# Patient Record
Sex: Male | Born: 1975 | Race: Black or African American | Hispanic: No | Marital: Married | Smoking: Former smoker
Health system: Southern US, Community
[De-identification: ages and names within clinical notes are randomized; demographics above are authoritative.]

## PROBLEM LIST (undated history)

## (undated) DIAGNOSIS — E876 Hypokalemia: Secondary | ICD-10-CM

## (undated) DIAGNOSIS — E875 Hyperkalemia: Secondary | ICD-10-CM

## (undated) DIAGNOSIS — L409 Psoriasis, unspecified: Secondary | ICD-10-CM

## (undated) DIAGNOSIS — K219 Gastro-esophageal reflux disease without esophagitis: Secondary | ICD-10-CM

## (undated) DIAGNOSIS — K529 Noninfective gastroenteritis and colitis, unspecified: Secondary | ICD-10-CM

## (undated) DIAGNOSIS — A419 Sepsis, unspecified organism: Secondary | ICD-10-CM

## (undated) DIAGNOSIS — M25552 Pain in left hip: Secondary | ICD-10-CM

## (undated) DIAGNOSIS — F1910 Other psychoactive substance abuse, uncomplicated: Secondary | ICD-10-CM

## (undated) DIAGNOSIS — L02213 Cutaneous abscess of chest wall: Secondary | ICD-10-CM

## (undated) DIAGNOSIS — F32A Depression, unspecified: Secondary | ICD-10-CM

## (undated) DIAGNOSIS — T7840XA Allergy, unspecified, initial encounter: Secondary | ICD-10-CM

## (undated) DIAGNOSIS — E1010 Type 1 diabetes mellitus with ketoacidosis without coma: Secondary | ICD-10-CM

## (undated) DIAGNOSIS — F17200 Nicotine dependence, unspecified, uncomplicated: Secondary | ICD-10-CM

## (undated) DIAGNOSIS — E1065 Type 1 diabetes mellitus with hyperglycemia: Secondary | ICD-10-CM

## (undated) DIAGNOSIS — M87059 Idiopathic aseptic necrosis of unspecified femur: Secondary | ICD-10-CM

## (undated) DIAGNOSIS — E785 Hyperlipidemia, unspecified: Secondary | ICD-10-CM

## (undated) DIAGNOSIS — E109 Type 1 diabetes mellitus without complications: Secondary | ICD-10-CM

## (undated) DIAGNOSIS — M1990 Unspecified osteoarthritis, unspecified site: Secondary | ICD-10-CM

## (undated) DIAGNOSIS — F1290 Cannabis use, unspecified, uncomplicated: Secondary | ICD-10-CM

## (undated) DIAGNOSIS — R634 Abnormal weight loss: Secondary | ICD-10-CM

## (undated) DIAGNOSIS — N179 Acute kidney failure, unspecified: Secondary | ICD-10-CM

## (undated) DIAGNOSIS — L309 Dermatitis, unspecified: Secondary | ICD-10-CM

## (undated) DIAGNOSIS — F419 Anxiety disorder, unspecified: Secondary | ICD-10-CM

## (undated) DIAGNOSIS — E43 Unspecified severe protein-calorie malnutrition: Secondary | ICD-10-CM

## (undated) DIAGNOSIS — E1110 Type 2 diabetes mellitus with ketoacidosis without coma: Secondary | ICD-10-CM

## (undated) DIAGNOSIS — R9431 Abnormal electrocardiogram [ECG] [EKG]: Secondary | ICD-10-CM

## (undated) DIAGNOSIS — R748 Abnormal levels of other serum enzymes: Secondary | ICD-10-CM

## (undated) DIAGNOSIS — I4510 Unspecified right bundle-branch block: Secondary | ICD-10-CM

## (undated) DIAGNOSIS — M8700 Idiopathic aseptic necrosis of unspecified bone: Secondary | ICD-10-CM

## (undated) DIAGNOSIS — I309 Acute pericarditis, unspecified: Secondary | ICD-10-CM

## (undated) HISTORY — DX: Nicotine dependence, unspecified, uncomplicated: F17.200

## (undated) HISTORY — DX: Hyperkalemia: E87.5

## (undated) HISTORY — DX: Type 1 diabetes mellitus with hyperglycemia: E10.65

## (undated) HISTORY — DX: Cannabis use, unspecified, uncomplicated: F12.90

## (undated) HISTORY — DX: Anxiety disorder, unspecified: F41.9

## (undated) HISTORY — DX: Allergy, unspecified, initial encounter: T78.40XA

## (undated) HISTORY — DX: Unspecified osteoarthritis, unspecified site: M19.90

## (undated) HISTORY — DX: Depression, unspecified: F32.A

## (undated) HISTORY — DX: Pain in left hip: M25.552

## (undated) HISTORY — DX: Acute kidney failure, unspecified: N17.9

## (undated) HISTORY — DX: Abnormal weight loss: R63.4

## (undated) HISTORY — DX: Idiopathic aseptic necrosis of unspecified femur: M87.059

## (undated) HISTORY — DX: Noninfective gastroenteritis and colitis, unspecified: K52.9

## (undated) HISTORY — DX: Hypokalemia: E87.6

## (undated) HISTORY — DX: Idiopathic aseptic necrosis of unspecified bone: M87.00

## (undated) HISTORY — DX: Unspecified severe protein-calorie malnutrition: E43

## (undated) HISTORY — DX: Acute pericarditis, unspecified: I30.9

## (undated) HISTORY — DX: Hyperlipidemia, unspecified: E78.5

## (undated) HISTORY — DX: Other disorders of bilirubin metabolism: E80.6

## (undated) HISTORY — DX: Cutaneous abscess of chest wall: L02.213

## (undated) HISTORY — PX: FINGER SURGERY: SHX640

## (undated) HISTORY — DX: Sepsis, unspecified organism: A41.9

## (undated) HISTORY — DX: Psoriasis, unspecified: L40.9

## (undated) HISTORY — DX: Type 1 diabetes mellitus with ketoacidosis without coma: E10.10

## (undated) HISTORY — DX: Abnormal electrocardiogram (ECG) (EKG): R94.31

## (undated) HISTORY — DX: Other psychoactive substance abuse, uncomplicated: F19.10

## (undated) HISTORY — DX: Unspecified right bundle-branch block: I45.10

## (undated) HISTORY — DX: Abnormal levels of other serum enzymes: R74.8

---

## 1999-02-21 ENCOUNTER — Emergency Department (HOSPITAL_COMMUNITY): Admission: EM | Admit: 1999-02-21 | Discharge: 1999-02-21 | Payer: Self-pay | Admitting: Emergency Medicine

## 1999-04-09 ENCOUNTER — Emergency Department (HOSPITAL_COMMUNITY): Admission: EM | Admit: 1999-04-09 | Discharge: 1999-04-09 | Payer: Self-pay | Admitting: Emergency Medicine

## 2000-01-08 ENCOUNTER — Encounter: Payer: Self-pay | Admitting: Emergency Medicine

## 2000-01-08 ENCOUNTER — Emergency Department (HOSPITAL_COMMUNITY): Admission: EM | Admit: 2000-01-08 | Discharge: 2000-01-08 | Payer: Self-pay | Admitting: Emergency Medicine

## 2000-03-19 ENCOUNTER — Emergency Department (HOSPITAL_COMMUNITY): Admission: EM | Admit: 2000-03-19 | Discharge: 2000-03-19 | Payer: Self-pay | Admitting: Emergency Medicine

## 2000-05-12 ENCOUNTER — Emergency Department (HOSPITAL_COMMUNITY): Admission: EM | Admit: 2000-05-12 | Discharge: 2000-05-12 | Payer: Self-pay | Admitting: Emergency Medicine

## 2002-05-05 ENCOUNTER — Emergency Department (HOSPITAL_COMMUNITY): Admission: EM | Admit: 2002-05-05 | Discharge: 2002-05-05 | Payer: Self-pay | Admitting: Emergency Medicine

## 2002-05-07 ENCOUNTER — Encounter: Payer: Self-pay | Admitting: Emergency Medicine

## 2002-05-07 ENCOUNTER — Emergency Department (HOSPITAL_COMMUNITY): Admission: EM | Admit: 2002-05-07 | Discharge: 2002-05-07 | Payer: Self-pay | Admitting: Emergency Medicine

## 2004-02-03 ENCOUNTER — Emergency Department (HOSPITAL_COMMUNITY): Admission: EM | Admit: 2004-02-03 | Discharge: 2004-02-03 | Payer: Self-pay | Admitting: Family Medicine

## 2006-09-07 ENCOUNTER — Emergency Department (HOSPITAL_COMMUNITY): Admission: EM | Admit: 2006-09-07 | Discharge: 2006-09-07 | Payer: Self-pay | Admitting: Emergency Medicine

## 2011-04-25 ENCOUNTER — Emergency Department (HOSPITAL_COMMUNITY)
Admission: EM | Admit: 2011-04-25 | Discharge: 2011-04-25 | Disposition: A | Discharge status: Home / Self Care | Payer: PRIVATE HEALTH INSURANCE | Attending: Emergency Medicine | Admitting: Emergency Medicine

## 2011-04-25 DIAGNOSIS — K047 Periapical abscess without sinus: Secondary | ICD-10-CM | POA: Insufficient documentation

## 2011-04-25 DIAGNOSIS — R220 Localized swelling, mass and lump, head: Secondary | ICD-10-CM | POA: Insufficient documentation

## 2011-04-25 DIAGNOSIS — K089 Disorder of teeth and supporting structures, unspecified: Secondary | ICD-10-CM | POA: Insufficient documentation

## 2013-03-16 ENCOUNTER — Emergency Department (HOSPITAL_COMMUNITY): Payer: Medicaid Other

## 2013-03-16 ENCOUNTER — Encounter (HOSPITAL_COMMUNITY): Payer: Self-pay | Admitting: Emergency Medicine

## 2013-03-16 ENCOUNTER — Inpatient Hospital Stay (HOSPITAL_COMMUNITY)
Admission: EM | Admit: 2013-03-16 | Discharge: 2013-03-20 | DRG: 637 | Disposition: A | Discharge status: Home / Self Care | Payer: Medicaid Other | Attending: Family Medicine | Admitting: Family Medicine

## 2013-03-16 DIAGNOSIS — F1210 Cannabis abuse, uncomplicated: Secondary | ICD-10-CM | POA: Diagnosis present

## 2013-03-16 DIAGNOSIS — R630 Anorexia: Secondary | ICD-10-CM | POA: Diagnosis present

## 2013-03-16 DIAGNOSIS — E1010 Type 1 diabetes mellitus with ketoacidosis without coma: Principal | ICD-10-CM | POA: Diagnosis present

## 2013-03-16 DIAGNOSIS — E1110 Type 2 diabetes mellitus with ketoacidosis without coma: Secondary | ICD-10-CM

## 2013-03-16 DIAGNOSIS — Z833 Family history of diabetes mellitus: Secondary | ICD-10-CM

## 2013-03-16 DIAGNOSIS — F17200 Nicotine dependence, unspecified, uncomplicated: Secondary | ICD-10-CM | POA: Diagnosis present

## 2013-03-16 DIAGNOSIS — Z681 Body mass index (BMI) 19 or less, adult: Secondary | ICD-10-CM

## 2013-03-16 DIAGNOSIS — E876 Hypokalemia: Secondary | ICD-10-CM

## 2013-03-16 DIAGNOSIS — L259 Unspecified contact dermatitis, unspecified cause: Secondary | ICD-10-CM | POA: Diagnosis present

## 2013-03-16 DIAGNOSIS — E43 Unspecified severe protein-calorie malnutrition: Secondary | ICD-10-CM | POA: Diagnosis present

## 2013-03-16 DIAGNOSIS — R531 Weakness: Secondary | ICD-10-CM

## 2013-03-16 DIAGNOSIS — R634 Abnormal weight loss: Secondary | ICD-10-CM | POA: Diagnosis present

## 2013-03-16 DIAGNOSIS — E1065 Type 1 diabetes mellitus with hyperglycemia: Secondary | ICD-10-CM

## 2013-03-16 DIAGNOSIS — L309 Dermatitis, unspecified: Secondary | ICD-10-CM

## 2013-03-16 HISTORY — DX: Dermatitis, unspecified: L30.9

## 2013-03-16 HISTORY — DX: Type 1 diabetes mellitus without complications: E10.9

## 2013-03-16 LAB — BASIC METABOLIC PANEL
CO2: 16 mEq/L — ABNORMAL LOW (ref 19–32)
CO2: 18 mEq/L — ABNORMAL LOW (ref 19–32)
Calcium: 8.9 mg/dL (ref 8.4–10.5)
Chloride: 92 mEq/L — ABNORMAL LOW (ref 96–112)
GFR calc non Af Amer: >90 mL/min (ref >90)
Glucose, Bld: 509 mg/dL — ABNORMAL HIGH (ref 70–99)
Glucose, Bld: 297 mg/dL — ABNORMAL HIGH (ref 70–99)
Potassium: 4.7 mEq/L (ref 3.5–5.1)
Potassium: 3.7 mEq/L (ref 3.5–5.1)
Sodium: 126 mEq/L — ABNORMAL LOW (ref 135–145)
Sodium: 131 mEq/L — ABNORMAL LOW (ref 135–145)

## 2013-03-16 LAB — GLUCOSE, CAPILLARY
Glucose-Capillary: 522 mg/dL — ABNORMAL HIGH (ref 70–99)
Glucose-Capillary: 356 mg/dL — ABNORMAL HIGH (ref 70–99)
Glucose-Capillary: 304 mg/dL — ABNORMAL HIGH (ref 70–99)

## 2013-03-16 LAB — URINALYSIS, ROUTINE W REFLEX MICROSCOPIC
Glucose, UA: >1000 mg/dL — AB
Hgb urine dipstick: NEGATIVE
Leukocytes, UA: NEGATIVE
Protein, ur: NEGATIVE mg/dL
Specific Gravity, Urine: 1.031 — ABNORMAL HIGH (ref 1.005–1.030)
Urobilinogen, UA: 0.2 mg/dL (ref 0.0–1.0)

## 2013-03-16 LAB — POCT I-STAT 3, ART BLOOD GAS (G3+)
Patient temperature: 98.6
TCO2: 11 mmol/L (ref 0–100)
pCO2 arterial: 21.6 mmHg — ABNORMAL LOW (ref 35.0–45.0)
pH, Arterial: 7.301 — ABNORMAL LOW (ref 7.350–7.450)

## 2013-03-16 LAB — CBC
Hemoglobin: 18.2 g/dL — ABNORMAL HIGH (ref 13.0–17.0)
Platelets: 331 10*3/uL (ref 150–400)
RBC: 5.17 MIL/uL (ref 4.22–5.81)
WBC: 7.9 10*3/uL (ref 4.0–10.5)

## 2013-03-16 LAB — POCT I-STAT TROPONIN I: Troponin i, poc: 0 ng/mL (ref 0.00–0.08)

## 2013-03-16 MED ORDER — TRIAMCINOLONE ACETONIDE 0.1 % EX CREA
1.0000 "application " | TOPICAL_CREAM | Freq: Two times a day (BID) | CUTANEOUS | Status: DC
  Filled 2013-03-16: qty 15

## 2013-03-16 MED ORDER — SODIUM CHLORIDE 0.9 % IV SOLN
INTRAVENOUS | Status: DC
  Administered 2013-03-16: 4.6 [IU]/h via INTRAVENOUS
  Filled 2013-03-16: qty 1

## 2013-03-16 MED ORDER — INSULIN REGULAR BOLUS VIA INFUSION
0.0000 [IU] | Freq: Three times a day (TID) | INTRAVENOUS | Status: DC
  Filled 2013-03-16: qty 10

## 2013-03-16 MED ORDER — DEXTROSE 50 % IV SOLN: 25.0000 mL | INTRAVENOUS | Status: DC | PRN

## 2013-03-16 MED ORDER — PANTOPRAZOLE SODIUM 40 MG PO TBEC
40.0000 mg | DELAYED_RELEASE_TABLET | Freq: Every day | ORAL | Status: DC
  Administered 2013-03-17 – 2013-03-20 (×4): 40 mg via ORAL
  Filled 2013-03-16 (×5): qty 1

## 2013-03-16 MED ORDER — SODIUM CHLORIDE 0.9 % IV BOLUS (SEPSIS)
2000.0000 mL | Freq: Once | INTRAVENOUS | Status: AC
  Administered 2013-03-16: 1000 mL via INTRAVENOUS

## 2013-03-16 MED ORDER — DEXTROSE-NACL 5-0.45 % IV SOLN
INTRAVENOUS | Status: DC
  Administered 2013-03-16: via INTRAVENOUS

## 2013-03-16 MED ORDER — WHITE PETROLATUM GEL: Status: DC | PRN

## 2013-03-16 MED ORDER — SODIUM CHLORIDE 0.9 % IV SOLN
INTRAVENOUS | Status: DC
  Administered 2013-03-16 – 2013-03-17 (×2): via INTRAVENOUS
  Administered 2013-03-18: 75 mL via INTRAVENOUS
  Administered 2013-03-18: 03:00:00 via INTRAVENOUS

## 2013-03-16 NOTE — ED Notes (Signed)
Delay in patient transporting to unit due to [facility] physician assessing in room. Attempted to call unit to inform of delay but no one answered phone.

## 2013-03-16 NOTE — ED Notes (Signed)
Pt asked could he obtain a urine sample. Pt stated that he couldn't at the time. Pt would let staff know what's he's able to obtain a sample. Will follow up shortly

## 2013-03-16 NOTE — ED Notes (Signed)
Called report to [place], [facility] unit [phone].

## 2013-03-16 NOTE — ED Provider Notes (Signed)
CSN: [id number]     Arrival date & time 03/16/13  1552 History     First MD Initiated Contact with Patient 03/16/13 1817     Chief Complaint  Patient presents with  . Chest Pain   (Consider location/radiation/quality/duration/timing/severity/associated sxs/prior Treatment) HPI Jared Murray is a 37 y.o. male who presents to ED with complaint of chest pain and generalized weakness. States weakness started several months ago. States lost weight unintentionally. States has hx of eczema, which has worsened, so he went to ED 2 wks ago, started on prednisone and triamcinolone cream. Since then feeling worse. Admits to chest pain that is non radiating, comes and goes, weakness, polyuria, polydipsia. No medical problems as far as he knows. Did not try any medications for this. Today was brought to ED because he was more weak than normal and "looked bad" to his sister.  History reviewed. No pertinent past medical history. History reviewed. No pertinent past surgical history. History reviewed. No pertinent family history. History  Substance Use Topics  . Smoking status: Current Every Day Smoker  . Smokeless tobacco: Not on file  . Alcohol Use: Yes    Review of Systems  Constitutional: Positive for appetite change, fatigue and unexpected weight change. Negative for fever and chills.  HENT: Negative for neck pain and neck stiffness.   Eyes: Negative.   Respiratory: Negative.   Cardiovascular: Positive for chest pain. Negative for palpitations and leg swelling.  Gastrointestinal: Negative.   Endocrine: Positive for polydipsia and polyuria.  Genitourinary: Negative for dysuria and flank pain.  Musculoskeletal: Negative.   Skin: Positive for rash.  Neurological: Positive for dizziness, weakness, light-headedness and headaches.    Allergies  Review of patient's allergies indicates no known allergies.  Home Medications   Current Outpatient Rx  Name  Route  Sig  Dispense  Refill  .  predniSONE (DELTASONE) 20 MG tablet   Oral   Take 20 mg by mouth daily.         Marland Kitchen triamcinolone cream (KENALOG) 0.1 %   Topical   Apply 1 application topically 2 (two) times daily. Applies to legs and arms          BP 114/73  Pulse 110  Temp(Src) 98 F (36.7 C) (Oral)  Resp 16  SpO2 99% Physical Exam  Nursing note and vitals reviewed. Constitutional: He is oriented to person, place, and time. He appears well-developed and well-nourished.  Ill appearing. Very thin  HENT:  Head: Normocephalic.  Eyes: Conjunctivae are normal.  Neck: Normal range of motion. Neck supple.  Cardiovascular: Normal rate, regular rhythm and normal heart sounds.   Pulmonary/Chest: Effort normal and breath sounds normal. No respiratory distress. He has no wheezes. He has no rales.  Abdominal: Soft. Bowel sounds are normal. He exhibits no distension. There is no tenderness. There is no rebound.  Musculoskeletal: He exhibits edema.  Neurological: He is alert and oriented to person, place, and time. No cranial nerve deficit. Coordination normal.  Skin: Skin is warm and dry.  Dry, scaly rash over entire body.   Psychiatric: He has a normal mood and affect. His behavior is normal.    ED Course   Procedures (including critical care time)  Labs Reviewed  CBC - Abnormal; Notable for the following:    Hemoglobin 18.2 (*)    MCH 35.2 (*)    MCHC 37.4 (*)    All other components within normal limits  BASIC METABOLIC PANEL - Abnormal; Notable for the following:  Sodium 126 (*)    Chloride 79 (*)    CO2 16 (*)    Glucose, Bld 509 (*)    All other components within normal limits  URINALYSIS, ROUTINE W REFLEX MICROSCOPIC - Abnormal; Notable for the following:    Specific Gravity, Urine 1.031 (*)    Glucose, UA >1000 (*)    Ketones, ur >80 (*)    All other components within normal limits  GLUCOSE, CAPILLARY - Abnormal; Notable for the following:    Glucose-Capillary 522 (*)    All other components  within normal limits  GLUCOSE, CAPILLARY - Abnormal; Notable for the following:    Glucose-Capillary 356 (*)    All other components within normal limits  BASIC METABOLIC PANEL - Abnormal; Notable for the following:    Sodium 131 (*)    Chloride 92 (*)    CO2 18 (*)    Glucose, Bld 297 (*)    BUN 5 (*)    All other components within normal limits  GLUCOSE, CAPILLARY - Abnormal; Notable for the following:    Glucose-Capillary 304 (*)    All other components within normal limits  POCT I-STAT 3, BLOOD GAS (G3+) - Abnormal; Notable for the following:    pH, Arterial 7.301 (*)    pCO2 arterial 21.6 (*)    pO2, Arterial 114.0 (*)    Bicarbonate 10.7 (*)    Acid-base deficit 13.0 (*)    All other components within normal limits  MRSA PCR SCREENING  URINE MICROSCOPIC-ADD ON  BASIC METABOLIC PANEL  BASIC METABOLIC PANEL  BASIC METABOLIC PANEL  HEMOGLOBIN A1C  TSH  C-PEPTIDE  INSULIN ANTIBODIES, BLOOD  GLUTAMIC ACID DECARBOXYLASE AUTO ABS  BASIC METABOLIC PANEL  BASIC METABOLIC PANEL  BASIC METABOLIC PANEL  POCT I-STAT TROPONIN I   Dg Chest 2 View  03/16/2013   *RADIOLOGY REPORT*  Clinical Data: Chest pain, shortness of breath  CHEST - 2 VIEW  Comparison: None.  Findings: Cardiomediastinal silhouette is unremarkable.  No acute infiltrate or pleural effusion.  No pulmonary edema.  Minimal degenerative changes mid thoracic spine.  IMPRESSION: No active disease.   Original Report Authenticated By: Natasha Mead, M.D.   1. DKA (diabetic ketoacidoses)   2. Weakness     MDM  Pt with no known medical problems other than eczema here with worsening weakness, polyuria, polydipsia after starting on prednisone 2 wks ago. Pt is tachycardic, appears weak and ill, almost cachectic. Work up here showed that pt is in DKA, with anion gap of 31. He was started with fluid resuscitation, glucose stabilizer. Medicine team called for admission for treatment and further testing.   Filed Vitals:   03/16/13  1918 03/16/13 1945 03/16/13 2028 03/17/13 0000  BP:  131/90 117/84 109/68  Pulse:  80 94 87  Temp:   99 F (37.2 C) 98.6 F (37 C)  TempSrc:   Oral Oral  Resp:  14 17 16   Height: 5\' 9"  (1.753 m)     Weight: 116 lb 4 oz (52.731 kg)     SpO2:  100% 100% 99%     Myriam Jacobson Tateanna Bach, PA-C 03/17/13 0122

## 2013-03-16 NOTE — ED Notes (Signed)
Pt c/o mid sternal CP after eating and generalized weakness; pt sts recently started on steriods

## 2013-03-16 NOTE — H&P (Signed)
Family Medicine Teaching [facility] Admission History and Physical Service Pager: [phone]  Patient name: Jared Murray Medical record number: [id number] Date of birth: 09-Oct-1975 Age: 37 y.o. Gender: male  Primary Care Provider: No PCP Per Patient  Chief Complaint: Weakness & Tiredness  Assessment and Plan: Jared Murray is a 37 y.o. year old male presenting with new onset Diabetes in DKA # DKA  - Start IV Insulin Drip - glucomander protocol  - q4 hour BMETs, will transition to SQ insulin after gap closes  - monitor electrolytes and VS  - acutely appears non-toxic  - Aggressive IV Fluids   # New Onset DM  - will order anti-body testing  - consider smoldering DM2 vs DM1 that was exacerbated by steroid use  - likely will need insulin @ d/c  # ECZEMA - long standing was started on oral Steroids as OP that were discontinued by pt.  - AVOID PO STEROIDS as will ultimately cause significant worsening of eczema  - continue topical Triamcinolone  - Cover All skin with non-medicated ointment over steroids  - consider wound care consult if worsening  # Abdominal Pain:  - likely DKA associated, exam benign  - pt relates to prednisone, consider stress ulcer, start PPI, no evidence of bleed,  # WEIGHT LOSS  - likely due to DKA/Type 1 DM  - consider other autoimmune   - Check TSH  - consider other - e.g. Malignancy but will defer further workup given compelling explanation with new onset DM   FEN/GI: NPO, IV NS with KCl Prophylaxis: SQ Heparin, PPI Disposition: To SDU for IV insulin Code Status: Full  History of Present Illness: Jared Murray is a 37 y.o. year old male presenting with progressive weakness, weight loss and progressive anorexia over the past month specficially but generally has hd 50# weight loss over the past 1 year.    Has had a long progressive hx of poorly controlled eczema.  Was seen at [facility] and started on prednisone (July 10).   Constitutional  + polyuria, polydypsia + fatigue and generalized weakness  Infectious No fevers, no chills but cold sensitivties  Resp No dyspnea  Cardiac No overt chest pain  GI Reflux like symptoms with Prednisone, no desire for BM, last BM 4-5 days  GU Polyuria  Skin Diffuse eczematous rash started on oral prednisone  Psych Reported normal  Neuro No changes in vision, no dysthesias No headaches No specific weaknesses  MSK   Trauma   Activity Significantly decreased everyday activity  Diet Was loosing weight for quite some time Appetite improved with prednisone  MEDS Prednisone is new but hasn't been taking as prescribed    There are no active problems to display for this patient.  Past Medical History: Past Medical History  Diagnosis Date  . Eczema    Past Surgical History: Past Surgical History  Procedure Laterality Date  . Finger surgery     Social History: History   Social History  . Marital Status: Married    Spouse Name: N/A    Number of Children: N/A  . Years of Education: N/A   Social History Main Topics  . Smoking status: Current Every Day Smoker  . Smokeless tobacco: None  . Alcohol Use: Yes     Comment: recently quit - 7/14   . Drug Use: Yes    Special: Marijuana  . Sexually Active: None   Other Topics Concern  . 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 Hypertension Father   . Diabetes      multiple  . Lupus Cousin   . Stroke Maternal Grandmother   . Stroke Paternal Grandmother    Allergies: Allergies  Allergen Reactions  . Penicillins    No current facility-administered medications on file prior to encounter.   No current outpatient prescriptions on file prior to encounter.   Prior to Admission medications   Medication Sig Start Date End Date Taking? Authorizing Provider  predniSONE (DELTASONE) 20 MG tablet Take 20 mg by mouth  daily.   Yes Historical Provider, MD  triamcinolone cream (KENALOG) 0.1 % Apply 1 application topically 2 (two) times daily. Applies to legs and arms   Yes Historical Provider, MD    Physical Exam: BP 109/68  Pulse 87  Temp(Src) 98.6 F (37 C) (Oral)  Resp 16  Ht 5\' 9"  (1.753 m)  Wt 116 lb 4 oz (52.731 kg)  BMI 17.16 kg/m2  SpO2 99% Exam: PE: GENERAL: Adult AA, thin and cachetic  male. In no discomfort; no respiratory distress  PSYCH:  alert and appropriate, good insight   HNEENT:  H&N: AT/[place], trachea midline  Eyes: no scleral icterus, no conjunctival exudate  Ears: Normal TM  Nose: No discharge  Oropharynx: MMM  Dentention:     CARDIO:  RRR, S1/S2 heard, no murmur  LUNGS:  CTA B, no wheezes, no crackles  ABDOMEN:  +BS, soft, non-tender, no rigidity, no guarding, no masses/hepatosplenomegaly  EXTREM:  cachectic, thin, moves all 4 extremities spontaneously.  Pulses are 2+ out of 4 bilaterally in the lower extremity.    GU:   SKIN:  profound eczematous rash with desquamation on the hands and feet with chronic scarring and lichenification on the abdomen chest and back.  No overt erythema or drainage.    NEUROMSK:      Labs and Imaging: CBC BMET   Recent Labs Lab 03/16/13 1620  WBC 7.9  HGB 18.2*  HCT 48.7  PLT 331     03/16/2013 19:49  Troponin i, poc 0.00    Recent Labs Lab 03/17/13 0115  NA 133*  K 3.2*  CL 99  CO2 23  BUN 5*  CREATININE 0.50  GLUCOSE 127*  CALCIUM 8.9      03/16/2013 20:06  Color, Urine YELLOW  APPearance CLEAR  Specific Gravity, Urine 1.031 (H)  pH 5.0  Glucose >1000 (A)  Bilirubin Urine NEGATIVE  Ketones, ur >80 (A)  Protein NEGATIVE  Urobilinogen, UA 0.2  Nitrite NEGATIVE  Leukocytes, UA NEGATIVE  Hgb urine dipstick NEGATIVE  WBC, UA 0-2  RBC / HPF 0-2  Squamous Epithelial / LPF RARE  Bacteria, UA RARE    Recent Labs Lab 03/16/13 1906 03/16/13 2038 03/16/13 2144  GLUCAP 522* 356* 304*      Jared Wilsey,  DO 03/17/2013, 4:26 AM

## 2013-03-17 DIAGNOSIS — E876 Hypokalemia: Secondary | ICD-10-CM

## 2013-03-17 DIAGNOSIS — R634 Abnormal weight loss: Secondary | ICD-10-CM | POA: Diagnosis present

## 2013-03-17 DIAGNOSIS — E43 Unspecified severe protein-calorie malnutrition: Secondary | ICD-10-CM | POA: Insufficient documentation

## 2013-03-17 DIAGNOSIS — E1110 Type 2 diabetes mellitus with ketoacidosis without coma: Secondary | ICD-10-CM

## 2013-03-17 DIAGNOSIS — L309 Dermatitis, unspecified: Secondary | ICD-10-CM | POA: Diagnosis present

## 2013-03-17 HISTORY — DX: Abnormal weight loss: R63.4

## 2013-03-17 HISTORY — DX: Hypokalemia: E87.6

## 2013-03-17 HISTORY — DX: Unspecified severe protein-calorie malnutrition: E43

## 2013-03-17 LAB — BASIC METABOLIC PANEL
BUN: 5 mg/dL — ABNORMAL LOW (ref 6–23)
BUN: 5 mg/dL — ABNORMAL LOW (ref 6–23)
BUN: 5 mg/dL — ABNORMAL LOW (ref 6–23)
BUN: 5 mg/dL — ABNORMAL LOW (ref 6–23)
CO2: 23 mEq/L (ref 19–32)
CO2: 22 mEq/L (ref 19–32)
CO2: 22 mEq/L (ref 19–32)
CO2: 23 mEq/L (ref 19–32)
Calcium: 8.8 mg/dL (ref 8.4–10.5)
Calcium: 8.4 mg/dL (ref 8.4–10.5)
Chloride: 99 mEq/L (ref 96–112)
Chloride: 93 mEq/L — ABNORMAL LOW (ref 96–112)
Chloride: 95 mEq/L — ABNORMAL LOW (ref 96–112)
Chloride: 96 mEq/L (ref 96–112)
Chloride: 96 mEq/L (ref 96–112)
Creatinine, Ser: 0.51 mg/dL (ref 0.50–1.35)
Creatinine, Ser: 0.55 mg/dL (ref 0.50–1.35)
Creatinine, Ser: 0.47 mg/dL — ABNORMAL LOW (ref 0.50–1.35)
Creatinine, Ser: 0.48 mg/dL — ABNORMAL LOW (ref 0.50–1.35)
GFR calc Af Amer: >90 mL/min (ref >90)
GFR calc Af Amer: >90 mL/min (ref >90)
GFR calc Af Amer: >90 mL/min (ref >90)
GFR calc non Af Amer: >90 mL/min (ref >90)
GFR calc non Af Amer: >90 mL/min (ref >90)
Glucose, Bld: 127 mg/dL — ABNORMAL HIGH (ref 70–99)
Glucose, Bld: 158 mg/dL — ABNORMAL HIGH (ref 70–99)
Glucose, Bld: 194 mg/dL — ABNORMAL HIGH (ref 70–99)
Potassium: 3.2 mEq/L — ABNORMAL LOW (ref 3.5–5.1)
Potassium: 4.2 mEq/L (ref 3.5–5.1)
Potassium: 3.6 mEq/L (ref 3.5–5.1)
Potassium: 3.4 mEq/L — ABNORMAL LOW (ref 3.5–5.1)
Sodium: 133 mEq/L — ABNORMAL LOW (ref 135–145)
Sodium: 132 mEq/L — ABNORMAL LOW (ref 135–145)
Sodium: 131 mEq/L — ABNORMAL LOW (ref 135–145)
Sodium: 129 mEq/L — ABNORMAL LOW (ref 135–145)

## 2013-03-17 LAB — CBC WITH DIFFERENTIAL/PLATELET
HCT: 43.3 % (ref 39.0–52.0)
Hemoglobin: 16 g/dL (ref 13.0–17.0)
Lymphocytes Relative: 19 % (ref 12–46)
Monocytes Absolute: 0.2 10*3/uL (ref 0.1–1.0)
Monocytes Relative: 5 % (ref 3–12)
Neutro Abs: 3.7 10*3/uL (ref 1.7–7.7)
WBC: 5.1 10*3/uL (ref 4.0–10.5)

## 2013-03-17 LAB — HIV ANTIBODY (ROUTINE TESTING W REFLEX): HIV: NONREACTIVE

## 2013-03-17 LAB — HEMOGLOBIN A1C
Hgb A1c MFr Bld: 11.9 % — ABNORMAL HIGH (ref <5.7)
Mean Plasma Glucose: 295 mg/dL — ABNORMAL HIGH (ref <117)

## 2013-03-17 LAB — C-PEPTIDE: C-Peptide: <0.1 ng/mL — ABNORMAL LOW (ref 0.80–3.90)

## 2013-03-17 LAB — GLUCOSE, CAPILLARY: Glucose-Capillary: 172 mg/dL — ABNORMAL HIGH (ref 70–99)

## 2013-03-17 MED ORDER — HEPARIN SODIUM (PORCINE) 5000 UNIT/ML IJ SOLN
5000.0000 [IU] | Freq: Three times a day (TID) | INTRAMUSCULAR | Status: DC
  Administered 2013-03-17 – 2013-03-20 (×9): 5000 [IU] via SUBCUTANEOUS
  Filled 2013-03-17 (×12): qty 1

## 2013-03-17 MED ORDER — SENNOSIDES-DOCUSATE SODIUM 8.6-50 MG PO TABS
2.0000 | ORAL_TABLET | Freq: Every day | ORAL | Status: DC
  Administered 2013-03-17: 2 via ORAL
  Filled 2013-03-17 (×2): qty 2

## 2013-03-17 MED ORDER — POTASSIUM CHLORIDE 10 MEQ/100ML IV SOLN
10.0000 meq | INTRAVENOUS | Status: AC
  Administered 2013-03-17 (×3): 10 meq via INTRAVENOUS
  Filled 2013-03-17: qty 200

## 2013-03-17 MED ORDER — BD GETTING STARTED TAKE HOME KIT: 1/2ML X 30G SYRINGES
1.0000 | Freq: Once | Status: DC
  Filled 2013-03-17: qty 1

## 2013-03-17 MED ORDER — INSULIN ASPART 100 UNIT/ML ~~LOC~~ SOLN: 3.0000 [IU] | Freq: Three times a day (TID) | SUBCUTANEOUS | Status: DC

## 2013-03-17 MED ORDER — DEXTROSE-NACL 5-0.45 % IV SOLN: INTRAVENOUS | Status: DC

## 2013-03-17 MED ORDER — DIPHENHYDRAMINE HCL 25 MG PO CAPS: 25.0000 mg | ORAL_CAPSULE | Freq: Four times a day (QID) | ORAL | Status: DC | PRN

## 2013-03-17 MED ORDER — GLUCERNA SHAKE PO LIQD
237.0000 mL | Freq: Three times a day (TID) | ORAL | Status: DC
  Administered 2013-03-17 – 2013-03-20 (×9): 237 mL via ORAL
  Filled 2013-03-17: qty 237

## 2013-03-17 MED ORDER — LIVING WELL WITH DIABETES BOOK
Freq: Once | Status: AC
  Administered 2013-03-18: 07:00:00
  Filled 2013-03-17: qty 1

## 2013-03-17 MED ORDER — INSULIN GLARGINE 100 UNIT/ML ~~LOC~~ SOLN
15.0000 [IU] | Freq: Every day | SUBCUTANEOUS | Status: DC
  Administered 2013-03-17 – 2013-03-18 (×2): 15 [IU] via SUBCUTANEOUS
  Filled 2013-03-17 (×2): qty 0.15

## 2013-03-17 MED ORDER — DEXTROSE 50 % IV SOLN
1.0000 | Freq: Once | INTRAVENOUS | Status: AC
  Administered 2013-03-17: 50 mL via INTRAVENOUS
  Filled 2013-03-17: qty 50

## 2013-03-17 MED ORDER — SENNOSIDES-DOCUSATE SODIUM 8.6-50 MG PO TABS: 1.0000 | ORAL_TABLET | Freq: Every day | ORAL | Status: DC

## 2013-03-17 MED ORDER — POTASSIUM CHLORIDE 10 MEQ/100ML IV SOLN
INTRAVENOUS | Status: AC
  Filled 2013-03-17: qty 100

## 2013-03-17 MED ORDER — INSULIN ASPART 100 UNIT/ML ~~LOC~~ SOLN
0.0000 [IU] | Freq: Three times a day (TID) | SUBCUTANEOUS | Status: DC
  Administered 2013-03-17 (×3): 2 [IU] via SUBCUTANEOUS
  Administered 2013-03-18: 3 [IU] via SUBCUTANEOUS
  Administered 2013-03-18: 7 [IU] via SUBCUTANEOUS

## 2013-03-17 MED ORDER — PNEUMOCOCCAL VAC POLYVALENT 25 MCG/0.5ML IJ INJ
0.5000 mL | INJECTION | INTRAMUSCULAR | Status: AC
  Administered 2013-03-18: 0.5 mL via INTRAMUSCULAR
  Filled 2013-03-17: qty 0.5

## 2013-03-17 MED ORDER — INSULIN ASPART 100 UNIT/ML ~~LOC~~ SOLN
10.0000 [IU] | Freq: Once | SUBCUTANEOUS | Status: AC
  Administered 2013-03-17: 10 [IU] via SUBCUTANEOUS

## 2013-03-17 MED ORDER — DIPHENHYDRAMINE HCL 50 MG/ML IJ SOLN: 12.5000 mg | Freq: Four times a day (QID) | INTRAMUSCULAR | Status: DC | PRN

## 2013-03-17 MED ORDER — TRIAMCINOLONE ACETONIDE 0.1 % EX CREA
1.0000 "application " | TOPICAL_CREAM | Freq: Three times a day (TID) | CUTANEOUS | Status: DC
  Administered 2013-03-17 – 2013-03-20 (×9): 1 via TOPICAL
  Filled 2013-03-17: qty 15

## 2013-03-17 NOTE — Progress Notes (Signed)
Pt admitted into room 3302 with belongings. VSS. CBG 356. Glucostabilizer updated and Insulin gtt verified. No c/o pain. ELINK and CCMD notified. Will continue to monitor.

## 2013-03-17 NOTE — ED Provider Notes (Signed)
Medical screening examination/treatment/procedure(s) were performed by non-physician practitioner and as supervising physician I was immediately available for consultation/collaboration.   Loren Racer, MD 03/17/13 [phone]

## 2013-03-17 NOTE — Progress Notes (Signed)
INITIAL NUTRITION ASSESSMENT  DOCUMENTATION CODES Per approved criteria  -Severe malnutrition in the context of chronic illness -Underweight   INTERVENTION:  Glucerna Shake PO TID, each supplement provides 220 kcal and 10 grams of protein.  Will provide diabetes diet education tomorrow.  NUTRITION DIAGNOSIS: Malnutrition related to inadequate oral intake as evidenced by 27% weight loss in the past year and severe depletion of muscle mass.   Goal: Intake to meet >90% of estimated nutrition needs.  Monitor:  PO intake, labs, weight trend.  Reason for Assessment: MST=4  37 y.o. male  Admitting Dx: DKA (diabetic ketoacidoses)  ASSESSMENT: Patient admitted with new onset diabetes in DKA. Not sure if type 1 of 2 yet. Patient reports that he has been eating poorly for the past few weeks. Has trouble swallowing solid foods at this time. Was eating 1 meal daily PTA with 1-2 small snacks throughout the day. He reports that he has been very weak lately; unable to bench press his usual amount of weight without being tired. Patient with 28% weight loss from usual weight of 160 lb over the past year.  Pt meets criteria for severe MALNUTRITION in the context of chronic illness as evidenced by 27% weight loss in the past year and severe depletion of muscle mass.  Nutrition Focused Physical Exam:  Subcutaneous Fat:  Orbital Region: mild-moderate depletion Upper Arm Region: mild-moderate depletion Thoracic and Lumbar Region: NA  Muscle:  Temple Region: mild-moderate depletion Clavicle Bone Region: severe depletion Clavicle and Acromion Bone Region: severe depletion Scapular Bone Region: NA Dorsal Hand: mild-moderate depeltion Patellar Region: NA Anterior Thigh Region: NA Posterior Calf Region: NA  Edema: none   Height: Ht Readings from Last 1 Encounters:  03/16/13 5\' 9"  (1.753 m)    Weight: Wt Readings from Last 1 Encounters:  03/16/13 116 lb 4 oz (52.731 kg)    Ideal  Body Weight: 72.7 kg  % Ideal Body Weight: 73%  Wt Readings from Last 10 Encounters:  03/16/13 116 lb 4 oz (52.731 kg)    Usual Body Weight: 160 lb  % Usual Body Weight: 73%  BMI:  Body mass index is 17.16 kg/(m^2). Underweight  Estimated Nutritional Needs: Kcal: 1800-2000 Protein: 80-100 gm Fluid: 1.8-2 L  Skin: no problems  Diet Order: Carb Control  EDUCATION NEEDS: -Education not appropriate at this time   Intake/Output Summary (Last 24 hours) at 03/17/13 1414 Last data filed at 03/16/13 2300  Gross per 24 hour  Intake    500 ml  Output    200 ml  Net    300 ml    Last BM: 7/26   Labs:   Recent Labs Lab 03/17/13 0450 03/17/13 0843 03/17/13 1130  NA 132* 131* 130*  K 3.9 4.2 3.6  CL 96 93* 95*  CO2 21 22 23   BUN 6 6 5*  CREATININE 0.51 0.55 0.50  CALCIUM 8.8 9.2 8.9  GLUCOSE 158* 206* 175*    CBG (last 3)   Recent Labs  03/17/13 0204 03/17/13 0309 03/17/13 0810  GLUCAP 103* 94 192*    Scheduled Meds: . feeding supplement  237 mL Oral TID WC  . heparin subcutaneous  5,000 Units Subcutaneous Q8H  . insulin aspart  0-9 Units Subcutaneous TID WC  . insulin glargine  15 Units Subcutaneous Q0600  . pantoprazole  40 mg Oral Daily  . [START ON 03/18/2013] pneumococcal 23 valent vaccine  0.5 mL Intramuscular Tomorrow-1000  . senna-docusate  2 tablet Oral QHS  . triamcinolone  cream  1 application Topical TID    Continuous Infusions: . sodium chloride 125 mL/hr at 03/17/13 1610    Past Medical History  Diagnosis Date  . Eczema     Past Surgical History  Procedure Laterality Date  . Finger surgery      Joaquin Courts, RD, LDN, CNSC Pager [phone] After Hours Pager [phone]

## 2013-03-17 NOTE — Progress Notes (Signed)
Paged [facility] concerning IVF and rate related to CBG. Awaiting return response.

## 2013-03-17 NOTE — Progress Notes (Signed)
Paged [facility], two orders for sliding scale insulin. Please clarify.    1610 call back. Wants NS @125ml /hr. 10 units novolog and sensitive sliding scale.

## 2013-03-17 NOTE — Progress Notes (Signed)
Inpatient Diabetes Program Recommendations  AACE/ADA: New Consensus Statement on Inpatient Glycemic Control (2013)  Target Ranges:  Prepandial:   less than 140 mg/dL      Peak postprandial:   less than 180 mg/dL (1-2 hours)      Critically ill patients:  140 - 180 mg/dL   Reason for Assessment:  New onset DM with DKA  Insulin Starter Kit and Living Well With Diabetes book ordered.  New Onset DM  To follow up insulin anti-body testing, c-peptide and glutamic acid   DM2 vs DM1 that was exacerbated by steroid use  - likely will need insulin @ d/c   GlucoStabilizer discontinued and anion gap is closed with SQ insulin orders.  Results for GERONIMO, DILIBERTO (MRN [id number]) as of 03/17/2013 17:00  Ref. Range 03/17/2013 15:11  Sodium Latest Range: 135-145 mEq/L 128 (L)  Potassium Latest Range: 3.5-5.1 mEq/L 3.3 (L)  Chloride Latest Range: 96-112 mEq/L 95 (L)  CO2 Latest Range: 19-32 mEq/L 23  BUN Latest Range: 6-23 mg/dL 5 (L)  Creatinine Latest Range: 0.50-1.35 mg/dL 3.08 (L)  Calcium Latest Range: 8.4-10.5 mg/dL 8.4  GFR calc non Af Amer Latest Range: >90 mL/min >90  GFR calc Af Amer Latest Range: >90 mL/min >90  Glucose Latest Range: 70-99 mg/dL 657 (H)  Results for RAMZEY, PETROVIC (MRN [id number]) as of 03/17/2013 17:00  Ref. Range 03/17/2013 01:15  Hemoglobin A1C Latest Range: <5.7 % 11.9 (H)   Will f/u in am.  Thank you. Ailene Ards, RD, LDN, CDE Inpatient Diabetes Coordinator [phone]

## 2013-03-17 NOTE — Progress Notes (Signed)
Patient seen today by attending Dr. Denny Levy, patient case discussed with resident team and I agree with resident plan for today. Paula Compton, MD

## 2013-03-17 NOTE — Progress Notes (Signed)
Family Medicine Teaching Service Daily Progress Note Intern Pager: [phone]  Patient name: Jared Murray Medical record number: [id number] Date of birth: [birth year] Age: 37 y.o. Gender: male  Primary Care Provider: No PCP Per Patient Consultants: None Code Status: Full Code  Pt Overview and Major Events to Date:  7/31 - taken off glucomander protocol  Assessment and Plan:  Jared Murray is a 37 y.o. year old male presenting with new onset Diabetes in DKA   # DKA - currently, anion gap is AG: 12, Last CBG: 192 - glucomander protocol stopped over night as anion gap corrected to 11. However, it opened on subsequent bmets. He was given 10 units novolog and one amp of D50%.  - continue lantus 15 with sensitive sliding scale correction - acutely appears non-toxic   # New Onset DM [ ]  follow up insulin anti-body testing, c-peptide and glutamic acid - consider DM2 vs DM1 that was exacerbated by steroid use  - likely will need insulin @ d/c   # Eczema - long standing. was started on oral Steroids as OP that were discontinued by pt.  - AVOID PO STEROIDS as will ultimately cause significant worsening of eczema  - continue topical Triamcinolone  - Cover All skin with non-medicated ointment over steroids  - consider wound care consult if worsening   # Abdominal Pain - no current complaints - likely DKA associated, exam benign  - pt relates to prednisone, consider stress ulcer, start PPI, no evidence of bleed  # Weight Loss - likely due to DKA/Type 1 DM vs HIV vs malignancy vs poor food intake vs other autoimmune  [ ]  f/u TSH [ ]  f/u HIV  FEN/GI: NS @ 145ml/hr, carb modified PPx: heparin subq  Disposition: Stepdown unit until stable  Subjective: Feels better than yesterday. No complaints overnight  Objective: Temp:  [97.8 F (36.6 C)-99 F (37.2 C)] 98.1 F (36.7 C) (07/31 0759) Pulse Rate:  [80-110] 81 (07/31 0400) Resp:  [11-17] 13 (07/31 0400) BP: (109-131)/(67-93)  114/67 mmHg (07/31 0759) SpO2:  [99 %-100 %] 100 % (07/31 0400) Weight:  [116 lb 4 oz (52.731 kg)] 116 lb 4 oz (52.731 kg) (07/30 1918) Physical Exam: General: Laying in bed comfortably, no acute distress Cardiovascular: RRR, no murmurs Respiratory: clear to auscultation bilaterally Abdomen: soft, non-tender, non-distended Extremities: Dry, multiple scars on arms and legs, multiple eczematous rashes on arms and legs. Desquamation on feet bilaterally with extremely dry skin.  Laboratory:  Recent Labs Lab 03/16/13 1620 03/17/13 1130  WBC 7.9 5.1  HGB 18.2* 16.0  HCT 48.7 43.3  PLT 331 267    Recent Labs Lab 03/17/13 0450 03/17/13 0843 03/17/13 1130  NA 132* 131* 130*  K 3.9 4.2 3.6  CL 96 93* 95*  CO2 21 22 23   BUN 6 6 5*  CREATININE 0.51 0.55 0.50  CALCIUM 8.8 9.2 8.9  GLUCOSE 158* 206* 175*   Urinalysis    Component Value Date/Time   COLORURINE YELLOW 03/16/2013 2006   APPEARANCEUR CLEAR 03/16/2013 2006   LABSPEC 1.031* 03/16/2013 2006   PHURINE 5.0 03/16/2013 2006   GLUCOSEU >1000* 03/16/2013 2006   HGBUR NEGATIVE 03/16/2013 2006   BILIRUBINUR NEGATIVE 03/16/2013 2006   KETONESUR >80* 03/16/2013 2006   PROTEINUR NEGATIVE 03/16/2013 2006   UROBILINOGEN 0.2 03/16/2013 2006   NITRITE NEGATIVE 03/16/2013 2006   LEUKOCYTESUR NEGATIVE 03/16/2013 2006     Jared Hawking, MD 03/17/2013, 1:26 PM PGY-1, [facility] Family Medicine FPTS Intern pager: [phone],  text pages welcome

## 2013-03-17 NOTE — H&P (Signed)
[facility] Attending Admission Note: Jared Levy MD [phone] pager office [phone] I  have seen and examined this patient, reviewed their chart. I have discussed this patient with the resident. I agree with the resident's findings, assessment and care planwith the following additions / clarifications: Given his profound weight loss, I would also check HIV. I think being alert for malignancy or other etiology for weight loss is important and will keep that at the top of our list throughout his hospitalization and follow up. He will need to be set up with a PCP. Re his eczema, I see no reason to avoid topical steroids--his eczema  Cover his ankles, elbows, extensor surface of forearms, some around ears and is quite excoriated. I would favor treatment with topical steroids and anti itch medication as I think he is at risk for excoriation with super-imposed infection.

## 2013-03-17 NOTE — Progress Notes (Signed)
Utilization review completed.  

## 2013-03-18 ENCOUNTER — Encounter (HOSPITAL_COMMUNITY): Payer: Self-pay | Admitting: Family Medicine

## 2013-03-18 DIAGNOSIS — R634 Abnormal weight loss: Secondary | ICD-10-CM

## 2013-03-18 DIAGNOSIS — E43 Unspecified severe protein-calorie malnutrition: Secondary | ICD-10-CM

## 2013-03-18 DIAGNOSIS — E876 Hypokalemia: Secondary | ICD-10-CM

## 2013-03-18 DIAGNOSIS — R5383 Other fatigue: Secondary | ICD-10-CM

## 2013-03-18 DIAGNOSIS — L259 Unspecified contact dermatitis, unspecified cause: Secondary | ICD-10-CM

## 2013-03-18 LAB — BASIC METABOLIC PANEL
CO2: 23 mEq/L (ref 19–32)
GFR calc non Af Amer: >90 mL/min (ref >90)
Glucose, Bld: 203 mg/dL — ABNORMAL HIGH (ref 70–99)
Potassium: 3.2 mEq/L — ABNORMAL LOW (ref 3.5–5.1)
Sodium: 133 mEq/L — ABNORMAL LOW (ref 135–145)

## 2013-03-18 LAB — GLUCOSE, CAPILLARY

## 2013-03-18 MED ORDER — POTASSIUM CHLORIDE CRYS ER 20 MEQ PO TBCR
40.0000 meq | EXTENDED_RELEASE_TABLET | Freq: Two times a day (BID) | ORAL | Status: AC
  Administered 2013-03-18 (×2): 40 meq via ORAL
  Filled 2013-03-18 (×2): qty 2

## 2013-03-18 MED ORDER — INSULIN ASPART 100 UNIT/ML ~~LOC~~ SOLN
0.0000 [IU] | Freq: Three times a day (TID) | SUBCUTANEOUS | Status: DC
  Administered 2013-03-18: 2 [IU] via SUBCUTANEOUS
  Administered 2013-03-19: 15 [IU] via SUBCUTANEOUS
  Administered 2013-03-19: 5 [IU] via SUBCUTANEOUS
  Administered 2013-03-20 (×2): 8 [IU] via SUBCUTANEOUS

## 2013-03-18 MED ORDER — INSULIN GLARGINE 100 UNIT/ML ~~LOC~~ SOLN
20.0000 [IU] | Freq: Every day | SUBCUTANEOUS | Status: DC
  Administered 2013-03-19: 20 [IU] via SUBCUTANEOUS
  Filled 2013-03-18 (×2): qty 0.2

## 2013-03-18 NOTE — Progress Notes (Signed)
Called [facility] team- re: Pt has financial and transportation issues to get medications this weekend. Sister lives out of town and cannot come until Monday. Also CBG 326  No new orders at this time.

## 2013-03-18 NOTE — Progress Notes (Addendum)
NUTRITION FOLLOW UP  DOCUMENTATION CODES  Per approved criteria   -Severe malnutrition in the context of chronic illness  -Underweight    Intervention:    Continue Glucerna Shake PO TID.  Continue current diet (CHO-modified medium calorie).  Inpatient diabetes diet education provided.  Outpatient diabetes diet education (RD ordered).  Nutrition Dx:   Malnutrition related to inadequate oral intake as evidenced by 27% weight loss in the past year and severe depletion of muscle mass. Ongoing.  Goal:   Intake to meet >90% of estimated nutrition needs. Progressing.  Monitor:   PO intake, labs, weight trend.  Assessment:   Patient reports improved oral intake. Is drinking Glucerna Shakes TID, tolerating well. Nutritional services ambassador is assisting patient with meal orders.  RD provided "Carbohydrate Counting for People with Diabetes" handout from the Academy of Nutrition and Dietetics. Discussed different food groups and their effects on blood sugar, emphasizing carbohydrate-containing foods. Provided list of carbohydrates and recommended serving sizes of common foods.  Discussed importance of controlled and consistent carbohydrate intake throughout the day. Provided examples of ways to balance meals/snacks and encouraged intake of high-fiber, whole grain complex carbohydrates. Teach back method used.   Height: Ht Readings from Last 1 Encounters:  03/16/13 5\' 9"  (1.753 m)    Weight Status:   Wt Readings from Last 1 Encounters:  03/16/13 116 lb 4 oz (52.731 kg)    Re-estimated needs:  Kcal: 1800-2000  Protein: 80-100 gm  Fluid: 1.8-2 L  Skin: no problems  Diet Order: Carb Control with Glucerna Shake PO TID   Intake/Output Summary (Last 24 hours) at 03/18/13 1059 Last data filed at 03/18/13 2130  Gross per 24 hour  Intake   1240 ml  Output      0 ml  Net   1240 ml    Last BM: 7/31   Labs:  Hemoglobin A1C  Date Value Range Status  03/17/2013 11.9* <5.7  % Final     (NOTE)                                                                               According to the ADA Clinical Practice Recommendations for 2011, when     HbA1c is used as a screening test:      >=6.5%   Diagnostic of Diabetes Mellitus               (if abnormal result is confirmed)     5.7-6.4%   Increased risk of developing Diabetes Mellitus     References:Diagnosis and Classification of Diabetes Mellitus,Diabetes     Care,2011,34(Suppl 1):S62-S69 and Standards of Medical Care in             Diabetes - 2011,Diabetes Care,2011,34 (Suppl 1):S11-S61.      Recent Labs Lab 03/17/13 1511 03/17/13 1818 03/18/13 0415  NA 128* 129* 133*  K 3.3* 3.4* 3.2*  CL 95* 96 101  CO2 23 23 23   BUN 5* 5* 4*  CREATININE 0.48* 0.46* 0.44*  CALCIUM 8.4 8.5 7.9*  GLUCOSE 194* 145* 203*    CBG (last 3)   Recent Labs  03/17/13 1528 03/17/13 2142 03/18/13 0757  GLUCAP 173* 266* 206*  Scheduled Meds: . bd getting started take home kit  1 kit Other Once  . feeding supplement  237 mL Oral TID WC  . heparin subcutaneous  5,000 Units Subcutaneous Q8H  . insulin aspart  0-9 Units Subcutaneous TID WC  . [START ON 03/19/2013] insulin glargine  20 Units Subcutaneous Q0600  . pantoprazole  40 mg Oral Daily  . pneumococcal 23 valent vaccine  0.5 mL Intramuscular Tomorrow-1000  . potassium chloride  40 mEq Oral BID  . senna-docusate  2 tablet Oral QHS  . triamcinolone cream  1 application Topical TID    Continuous Infusions: . sodium chloride 125 mL/hr at 03/18/13 0247    Joaquin Courts, RD, LDN, CNSC Pager [phone] After Hours Pager [phone]

## 2013-03-18 NOTE — Progress Notes (Signed)
Family Medicine Teaching Service Daily Progress Note Intern Pager: [phone]  Patient name: Jared Murray Medical record number: [id number] Date of birth: 01/15/1976 Age: 37 y.o. Gender: male  Primary Care Provider: No PCP Per Patient Consultants: None Code Status: Full Code  Pt Overview and Major Events to Date:  7/31 - taken off glucomander protocol 8/1 - transferring out of stepdown unit  Assessment and Plan:  Jared Murray is a 37 y.o. year old male presenting with new onset Diabetes in DKA   # DKA - currently resolved.  - transfer out of stepdown today  # New Onset DM - Last CBG: 206, 16 units correction - increase to lantus 20 with sensitive sliding scale correction - A1C: 11.9 - c-peptide low - receiving diabetes education [ ]  follow up insulin, glutamic acid anti-body testing  # Eczema - long standing. was started on oral Steroids as OP that were discontinued by pt.  - AVOID PO STEROIDS as will ultimately cause significant worsening of eczema  - continue topical Triamcinolone  - Cover All skin with non-medicated ointment over steroids   # Abdominal Pain - no current complaints - likely DKA associated, exam benign   # Weight Loss - could be due to DKA/Type 1 DM vs HIV vs malignancy vs poor food intake vs other autoimmune. Most likely from undiagnosed new onset DM1. A1C is 11.9.  - TSH normal (1.439) - HIV non reactive  FEN/GI: NS @ 150ml/hr, carb modified PPx: heparin subq  Disposition: transfer out of stepdown.  Subjective: Feels better than yesterday. No complaints overnight  Objective: Temp:  [98 F (36.7 C)-98.9 F (37.2 C)] 98 F (36.7 C) (08/01 0806) Pulse Rate:  [72-92] 81 (08/01 0801) Resp:  [10-17] 10 (08/01 0400) BP: (104-116)/(55-78) 107/78 mmHg (08/01 0801) SpO2:  [97 %-98 %] 97 % (08/01 0400) Physical Exam: General: Laying in bed comfortably, no acute distress Cardiovascular: RRR, no murmurs Respiratory: clear to auscultation  bilaterally Abdomen: soft, non-tender, non-distended Extremities: Dry, multiple scars on arms and legs, multiple eczematous rashes on arms and legs. Desquamation on feet bilaterally with extremely dry skin.  Laboratory:  Recent Labs Lab 03/16/13 1620 03/17/13 1130  WBC 7.9 5.1  HGB 18.2* 16.0  HCT 48.7 43.3  PLT 331 267    Recent Labs Lab 03/17/13 1511 03/17/13 1818 03/18/13 0415  NA 128* 129* 133*  K 3.3* 3.4* 3.2*  CL 95* 96 101  CO2 23 23 23   BUN 5* 5* 4*  CREATININE 0.48* 0.46* 0.44*  CALCIUM 8.4 8.5 7.9*  GLUCOSE 194* 145* 203*   Urinalysis    Component Value Date/Time   COLORURINE YELLOW 03/16/2013 2006   APPEARANCEUR CLEAR 03/16/2013 2006   LABSPEC 1.031* 03/16/2013 2006   PHURINE 5.0 03/16/2013 2006   GLUCOSEU >1000* 03/16/2013 2006   HGBUR NEGATIVE 03/16/2013 2006   BILIRUBINUR NEGATIVE 03/16/2013 2006   KETONESUR >80* 03/16/2013 2006   PROTEINUR NEGATIVE 03/16/2013 2006   UROBILINOGEN 0.2 03/16/2013 2006   NITRITE NEGATIVE 03/16/2013 2006   LEUKOCYTESUR NEGATIVE 03/16/2013 2006     Jacquelin Hawking, MD 03/18/2013, 9:44 AM PGY-1, [facility] FPTS Intern pager: [phone], text pages welcome

## 2013-03-18 NOTE — Care Management Note (Signed)
    Page 1 of 1   03/18/2013     11:25:58 AM   CARE MANAGEMENT NOTE 03/18/2013  Patient:  Jared Murray, Jared Murray   Account Number:  [id number]  Date Initiated:  03/17/2013  Documentation initiated by:  Donn Pierini  Subjective/Objective Assessment:   Pt admitted with DKA     Action/Plan:   PTA pt lived at home- may need medicaiton assistance at discharge- NCM to follow for potential needs. Pt would be elgible for MATCH if needed   Anticipated DC Date:  03/19/2013   Anticipated DC Plan:  HOME/SELF CARE      DC Planning Services  CM consult  Medication Assistance  MATCH Program      Choice offered to / List presented to:             Status of service:  In process, will continue to follow Medicare Important Message given?   (If response is "NO", the following Medicare IM given date fields will be blank) Date Medicare IM given:   Date Additional Medicare IM given:    Discharge Disposition:    Per UR Regulation:  Reviewed for med. necessity/level of care/duration of stay  If discussed at Long Length of Stay Meetings, dates discussed:    Comments:  03/18/13- 1100- Donn Pierini RN, BSN [phone] Spoke with pt and wife at bedside- per conversation pt states that he believes that he is going to be set up with the [facility] outpt clinic for f/u- will confirm- discussed options for places to get inexpensive insulin such as [employer] and [employer]- also discussed glucose meters and strips available at [employer]- (relion brand) and talked about assisting pt at d/c with medications- Explained to pt about MATCH program and how it works ($3 copay per med)- pt is eligible for program and will be given MATCH letter at time of discharge- please call CM if pt does not already have letter. Call made to pts sister- Cristina Gong [phone]) per pt requst regarding d/c plans- per TC with sister- pt would not have transportation to get meds/meter etc until Monday 03/21/13- she is concerned about pt going home  prior to this and not having his meds or the things he needs to manage his new dx of DM. Pt sister able to be here Monday to assist pt in getting these things and provide transportation.

## 2013-03-18 NOTE — Progress Notes (Signed)
Inpatient Diabetes Program Recommendations  AACE/ADA: New Consensus Statement on Inpatient Glycemic Control (2013)  Target Ranges:  Prepandial:   less than 140 mg/dL      Peak postprandial:   less than 180 mg/dL (1-2 hours)      Critically ill patients:  140 - 180 mg/dL   Reason for Visit: New-onset DM with DKA   37 y.o. year old male presenting with new onset Diabetes in DKA.  No PCP       DKA - currently resolved.  - transfer out of stepdown to 6N  -Last CBGs: 206, 326 -Lantus increased to 20 units QAM with sensitive sliding scale correction  - A1C: 11.9  - c-peptide low  - continuing with diabetes education - encouraged pt to view diabetes videos on pt ed channel    Inpatient Diabetes Program Recommendations Insulin - Basal: Lantus increased to 20 units QD. Titrate up until FBS <150 mg/dL Insulin - Meal Coverage: Needs meal coverage insulin - Add Novolog 5 units tidwc if pt eats >50% tray Outpatient Referral: Has referral to [facility] Will need prescription for meter at discharge. Long conversation with pt regarding diagnosis of DM, importance of monitoring, f/u with PCP, diet, and insulin administration.  Pt will need much encouragement.  Will benefit from OP Diabetes education after discharge.  Note: Discussed above with RN. Thank you. Ailene Ards, RD, LDN, CDE Inpatient Diabetes Coordinator [phone]

## 2013-03-18 NOTE — Progress Notes (Signed)
FMTS Attending Note Patient seen and examined by me, discussed with resident team and I agree with assess/plan.  Patient is much improved, on Lantus insulin with closed anion gap, tolerating food. Plan to discharge to floor.  Continue to monitor CBG closely.  Paula Compton, MD

## 2013-03-18 NOTE — Progress Notes (Signed)
Report called. All belongings sent with patient. Pt. Stated he will notify wife of new room. Meds sent with patient. 6N RN notified pt. Needs diabetes starter kit and more education. Will continue to monitor.

## 2013-03-19 DIAGNOSIS — E1065 Type 1 diabetes mellitus with hyperglycemia: Secondary | ICD-10-CM

## 2013-03-19 LAB — GLUCOSE, CAPILLARY
Glucose-Capillary: 250 mg/dL — ABNORMAL HIGH (ref 70–99)
Glucose-Capillary: 233 mg/dL — ABNORMAL HIGH (ref 70–99)
Glucose-Capillary: 352 mg/dL — ABNORMAL HIGH (ref 70–99)

## 2013-03-19 MED ORDER — BD GETTING STARTED TAKE HOME KIT: 3/10ML X 30G SYRINGES
1.0000 | Freq: Once | Status: AC
  Administered 2013-03-19: 1
  Filled 2013-03-19: qty 1

## 2013-03-19 MED ORDER — BD GETTING STARTED TAKE HOME KIT: 1/2ML X 30G SYRINGES
1.0000 | Freq: Once | Status: AC
  Filled 2013-03-19: qty 1

## 2013-03-19 NOTE — Progress Notes (Signed)
FMTS Attending NOte Patient seen and examined by me today, discussed with Dr Althea Charon and I agree with his plan.  Patient is medically much improved, tolerating diet and with much improved glucose control.  He is newly diagnosed with Type I diabetes.  He is in need of intensive education about self-administration of insulin, as well as glucometer for home CBG checks.  Additionally, there is a question about his ability to access insulin in the outpatient setting in the coming 2 days.   I believe he is appropriate for discharge, provided the following are accomplished: 1: intensive teaching on self-administration of insulin;  2. Rx for glucometer/supplies (or provision of machine to patient); 3. If he cannot access insulin at home, then a short-term supply of insulin to be arranged before discharge.  Aretta Nip

## 2013-03-19 NOTE — Progress Notes (Signed)
Family Medicine Teaching Service Daily Progress Note Intern Pager: [phone]  Patient name: Jared Murray Medical record number: [id number] Date of birth: Jul 17, 1976 Age: 37 y.o. Gender: male  Primary Care Provider: No PCP Per Patient - Scheduled to follow-up at [facility] Consultants: None Code Status: Full Code  Pt Overview and Major Events to Date:  7/31 - taken off glucomander protocol 8/1 - transferring out of stepdown unit 8/2 - PO intake normal, no IVF, DM education provided  Assessment and Plan:  Jared Murray is a 37 y.o. year old male presenting with new onset Diabetes in DKA   # DKA - currently resolved.  - transferred from step-down  # New Onset DM - Last CBG: 233 (range 200-250), 5 units correction today (trend 16, 12, past 2 days) - increase to lantus 20 with sensitive sliding scale correction - A1C: 11.9 - c-peptide low - receiving diabetes education, scheduled for follow-up at [facility] [ ]  follow up insulin, glutamic acid anti-body testing  # Eczema - long standing. was started on oral Steroids as OP that were discontinued by pt.  - AVOID PO STEROIDS as will ultimately cause significant worsening of eczema  - continue topical Triamcinolone  - Cover All skin with non-medicated ointment over steroids   # Abdominal Pain - no current complaints - likely DKA associated, exam benign   # Weight Loss - could be due to DKA/Type 1 DM vs HIV vs malignancy vs poor food intake vs other autoimmune. Most likely from undiagnosed new onset DM1. A1C is 11.9.  - TSH normal (1.439) - HIV non reactive  FEN/GI: normal PO intake (DC'd 51ml/hr NS), carb modified PPx: heparin subq  Disposition: Home, pending organization of social issues of arranging transportation, medication, financial assistance  Subjective: No concerns or complaints today. Appetite has increased, and reports that it is back to normal. Has received some DM education, will watch videos. Aware that he has follow-up  to establish care at [facility] with new PCP. Plans to schedule for further DM education classes starting Monday. States he needs assistance from his sister, will be in town on Monday, for transportation and finances.  Denies fevers, chest pain, shortness of breath, abdominal pain, diarrhea. No visual changes. Normal bowel habits.  Objective: Temp:  [98.1 F (36.7 C)-99.2 F (37.3 C)] 98.1 F (36.7 C) (08/02 0551) Pulse Rate:  [81-97] 91 (08/02 0551) Resp:  [16-20] 18 (08/02 0551) BP: (99-121)/(54-75) 115/75 mmHg (08/02 0551) SpO2:  [98 %-100 %] 98 % (08/02 0551) Physical Exam: General: pleasant, conversational, sitting up in bed comfortable, NAD Cardiovascular: RRR, no murmurs Respiratory: CTAB Abdomen: soft, non-tender, non-distended, +BS in all 4 quads Extremities: Dry, multiple scars on arms and legs, multiple eczematous rashes on arms and legs. Desquamation on feet bilaterally with extremely dry skin. Psych - alert, oriented, good insight on condition  Laboratory:  Recent Labs Lab 03/16/13 1620 03/17/13 1130  WBC 7.9 5.1  HGB 18.2* 16.0  HCT 48.7 43.3  PLT 331 267    Recent Labs Lab 03/17/13 1511 03/17/13 1818 03/18/13 0415  NA 128* 129* 133*  K 3.3* 3.4* 3.2*  CL 95* 96 101  CO2 23 23 23   BUN 5* 5* 4*  CREATININE 0.48* 0.46* 0.44*  CALCIUM 8.4 8.5 7.9*  GLUCOSE 194* 145* 203*   Urinalysis    Component Value Date/Time   COLORURINE YELLOW 03/16/2013 2006   APPEARANCEUR CLEAR 03/16/2013 2006   LABSPEC 1.031* 03/16/2013 2006   PHURINE 5.0 03/16/2013 2006  GLUCOSEU >1000* 03/16/2013 2006   HGBUR NEGATIVE 03/16/2013 2006   BILIRUBINUR NEGATIVE 03/16/2013 2006   KETONESUR >80* 03/16/2013 2006   PROTEINUR NEGATIVE 03/16/2013 2006   UROBILINOGEN 0.2 03/16/2013 2006   NITRITE NEGATIVE 03/16/2013 2006   LEUKOCYTESUR NEGATIVE 03/16/2013 2006     Saralyn Pilar, DO 03/19/2013, 11:15 AM PGY-1, [facility] FPTS Intern pager: [phone], text pages  welcome

## 2013-03-19 NOTE — Progress Notes (Signed)
Patient asked [place] to call his sister because she had some questions about his medicines and care.  She was informed that patient has been given a Diabetes Starter kit and is learning to give his own insulin shots in the hospital.  She verbalized understanding and stated that she lives out of town and will not be able to come pick up patient until Monday after 4pm.  Called and left message with weekend case manager to discuss patient needs and discharge.

## 2013-03-19 NOTE — Progress Notes (Signed)
03/19/2013 1600 NCM spoke to pt and explained MATCH letter will be provided at time of dc for assistance with meds. Info about [employer] added to dc instructions, pt instructed to call and arrange appt. Isidoro Donning RN CCM Case Mgmt phone [phone]

## 2013-03-20 LAB — GLUCOSE, CAPILLARY: Glucose-Capillary: 292 mg/dL — ABNORMAL HIGH (ref 70–99)

## 2013-03-20 LAB — BASIC METABOLIC PANEL
CO2: 26 mEq/L (ref 19–32)
Calcium: 8.3 mg/dL — ABNORMAL LOW (ref 8.4–10.5)
GFR calc non Af Amer: >90 mL/min (ref >90)
Potassium: 3.7 mEq/L (ref 3.5–5.1)
Sodium: 129 mEq/L — ABNORMAL LOW (ref 135–145)

## 2013-03-20 MED ORDER — NOVOLOG 100 UNIT/ML ~~LOC~~ SOLN: 2.0000 [IU] | Freq: Three times a day (TID) | SUBCUTANEOUS | Status: DC

## 2013-03-20 MED ORDER — INSULIN GLARGINE 100 UNIT/ML ~~LOC~~ SOLN: 25.0000 [IU] | Freq: Every day | SUBCUTANEOUS | Status: DC

## 2013-03-20 MED ORDER — SYRINGE (DISPOSABLE) 1 ML MISC: 0.2500 mL | Freq: Every day | Status: DC

## 2013-03-20 MED ORDER — INSULIN GLARGINE 100 UNIT/ML ~~LOC~~ SOLN
25.0000 [IU] | Freq: Every day | SUBCUTANEOUS | Status: DC
  Administered 2013-03-20: 25 [IU] via SUBCUTANEOUS
  Filled 2013-03-20: qty 0.25

## 2013-03-20 MED ORDER — WHITE PETROLATUM GEL: 1.0000 "application " | Status: DC | PRN

## 2013-03-20 NOTE — Progress Notes (Signed)
   CARE MANAGEMENT NOTE 03/20/2013  Patient:  Jared Murray, Jared Murray   Account Number:  [id number]  Date Initiated:  03/17/2013  Documentation initiated by:  Jared Murray  Subjective/Objective Assessment:   Pt admitted with DKA     Action/Plan:   PTA pt lived at home- may need medicaiton assistance at discharge- NCM to follow for potential needs. Pt would be elgible for MATCH if needed   Anticipated DC Date:  03/19/2013   Anticipated DC Plan:  HOME/SELF CARE      DC Planning Services  CM consult  Medication Assistance  MATCH Program      Choice offered to / List presented to:             Status of service:  Completed, signed off Medicare Important Message given?   (If response is "NO", the following Medicare IM given date fields will be blank) Date Medicare IM given:   Date Additional Medicare IM given:    Discharge Disposition:  HOME W HOME HEALTH SERVICES  Per UR Regulation:  Reviewed for med. necessity/level of care/duration of stay  If discussed at Long Length of Stay Meetings, dates discussed:    Comments:  03/20/2013 1230 NCM provided pt with MATCH letter and application for PAP with [employer] for Lantus. Explained he will have to follow up with PCP to complete application to help receive up to 12 months of Lantus free with manufacture. Isidoro Donning RN CCM Case Mgmt phone [phone]  03/19/2013 1600 NCM spoke to pt and explained MATCH letter will be provided at time of dc for assistance with meds. Info about [employer] added to dc instructions, pt instructed to call and arrange appt. Isidoro Donning RN CCM Case Mgmt phone [phone]  03/18/13- 1100- Jared Pierini RN, BSN [phone] Spoke with pt and wife at bedside- per conversation pt states that he believes that he is going to be set up with the [facility] outpt clinic for f/u- will confirm- discussed options for places to get inexpensive insulin such as [employer] and [employer]- also discussed glucose meters and  strips available at [employer]- ([employer]ion brand) and talked about assisting pt at d/c with medications- Explained to pt about MATCH program and how it works ($3 copay per med)- pt is eligible for program and will be given MATCH letter at time of discharge- please call CM if pt does not already have letter. Call made to pt's sister- Cristina Gong [phone]) per pt requst regarding d/c plans- per TC with sister- pt would not have transportation to get meds/meter etc until Monday 03/21/13- she is concerned about pt going home prior to this and not having his meds or the things he needs to manage his new dx of DM. Pt sister able to be here Monday to assist pt in getting these things and provide transportation.

## 2013-03-20 NOTE — Progress Notes (Signed)
FMTS Attending Note Patient seen and examined today, discussed with Dr Caleb Popp and I agree with his assessment and plan.  Patient is well appearing, tolerating diet well and has self-administered subq insulin 2 times already.  His CBGs are in the 140-290 range.  He has had extensive DM teaching while here.  The biggest challenge at discharge is ensuring he has the proper materials and insulin to be able to take until his follow up, which he will need to arrange tomorrow (Monday).  We appreciate the help of Case Management.  Patient's sister is taking a role in helping patient with med administration, her name is Darden Amber, [place] [phone].  Dr Caleb Popp has spoken with her by phone regarding plans for discharge with meds, as well as outpatient glucometer.   Paula Compton, MD

## 2013-03-20 NOTE — Progress Notes (Signed)
Pt ready for discharge to home.  Pt given Rx for Lantus insulin, Novolog insulin and syringes for home use and a form for the glucose meter.  Pt is to take 25 units of Lantus insulin at 10 pm tomorrow evening and 2 units of Novolog insulin before each meal.  Pt is to check his glucose before meals and at bedtime and to take his log to the doctor to show the doctor his readings.  Pt given MATCH (medicine assistance) letter to take to the list of pharmacies that acknowledge the [facility] program to obtain his Rx.  Pt understands.  Pt has been able to adequately demonstrate how to check his glucose and to administer his insulin 3 times. Pt has taught back to me where he can admin. His insulin and has a diagram on sites in his booklet.   Pt has starter kit with syringes, booklet to use once home.  Pt has appt for follow up for tomorrow, 03/21/13 at 1:00 pm, mother is going to take pt to the appt.   Pt does some work at nighttime so reviewed how he should check his glucose at 2-3 am and take snacks during the night if he is working.  Pt very appreciative of assistance.

## 2013-03-20 NOTE — Progress Notes (Signed)
Family Medicine Teaching Service Daily Progress Note Intern Pager: [phone]  Patient name: Jared Murray Medical record number: [id number] Date of birth: 10-26-1975 Age: 37 y.o. Gender: male  Primary Care Provider: No PCP Per Patient - Instructions to schedule to follow-up at [facility] Consultants: None Code Status: Full Code  Pt Overview and Major Events to Date:  7/31 - taken off glucomander protocol 8/1 - transferring out of stepdown unit 8/2 - PO intake normal, no IVF, DM education provided  Assessment and Plan:  Jared Murray is a 37 y.o. year old male presenting with new onset Diabetes in DKA   # DKA - currently resolved.   # New Onset DM Type 1 - Last CBG: 292 (range 100-350), 20 units correction yesterday and no lantus - increase to lantus 25 with moderate sliding scale correction - A1C: 11.9 - c-peptide low - receiving diabetes education today - will follow-up at [facility] - will receive [facility] letter for assistance with meds at discharge. - will discharge on lantus 25 units with 2-3 units before meals [ ]  follow up insulin, glutamic acid anti-body testing  # Eczema - long standing. was started on oral Steroids as OP that were discontinued by pt.  - AVOID PO STEROIDS as will ultimately cause significant worsening of eczema  - continue topical Triamcinolone  - Cover All skin with non-medicated ointment over steroids   # Abdominal Pain - no current complaints - likely DKA associated, exam benign   # Weight Loss - could be due to DKA/Type 1 DM vs HIV vs malignancy vs poor food intake vs other autoimmune. Most likely from undiagnosed new onset DM1. A1C is 11.9.  - TSH normal (1.439) - HIV non reactive  FEN/GI: normal PO intake (DC'd 33ml/hr NS), carb modified PPx: heparin subq  Disposition: Home today, pending organization of social issues of arranging transportation, medication, financial assistance  Subjective: No concerns or complaints today.  States that he's ready to go home and can get a ride home. He has not received glucometer training yet but is meeting with the diabetes coordinator today. He wants his wife to get trained also. Denies fevers, chest pain, shortness of breath, abdominal pain, diarrhea. Normal bowel habits.  Objective: Temp:  [97.8 F (36.6 C)-98.1 F (36.7 C)] 98.1 F (36.7 C) (08/03 0541) Pulse Rate:  [81-91] 91 (08/03 0541) Resp:  [16-19] 19 (08/03 0541) BP: (99-110)/(59-78) 108/78 mmHg (08/03 0541) SpO2:  [98 %-100 %] 98 % (08/03 0541)  Physical Exam: General: conversational, laying in bed comfortable, NAD Cardiovascular: RRR, no murmurs Respiratory: CTAB, no wheezes Abdomen: soft, non-tender, non-distended, +BS Extremities: Dry, multiple scars on arms and legs, multiple eczematous rashes on arms and legs. Desquamation on feet bilaterally with extremely dry skin. Psych - alert, oriented, good insight on condition  Laboratory:  Recent Labs Lab 03/16/13 1620 03/17/13 1130  WBC 7.9 5.1  HGB 18.2* 16.0  HCT 48.7 43.3  PLT 331 267    Recent Labs Lab 03/17/13 1511 03/17/13 1818 03/18/13 0415  NA 128* 129* 133*  K 3.3* 3.4* 3.2*  CL 95* 96 101  CO2 23 23 23   BUN 5* 5* 4*  CREATININE 0.48* 0.46* 0.44*  CALCIUM 8.4 8.5 7.9*  GLUCOSE 194* 145* 203*   Urinalysis    Component Value Date/Time   COLORURINE YELLOW 03/16/2013 2006   APPEARANCEUR CLEAR 03/16/2013 2006   LABSPEC 1.031* 03/16/2013 2006   PHURINE 5.0 03/16/2013 2006   GLUCOSEU >1000* 03/16/2013 2006  HGBUR NEGATIVE 03/16/2013 2006   BILIRUBINUR NEGATIVE 03/16/2013 2006   KETONESUR >80* 03/16/2013 2006   PROTEINUR NEGATIVE 03/16/2013 2006   UROBILINOGEN 0.2 03/16/2013 2006   NITRITE NEGATIVE 03/16/2013 2006   LEUKOCYTESUR NEGATIVE 03/16/2013 2006     Jacquelin Hawking, MD 03/20/2013, 7:31 AM PGY-1, [place] [facility] FPTS Intern pager: [phone], text pages welcome

## 2013-03-20 NOTE — Discharge Summary (Signed)
Family Medicine Teaching [facility] Discharge Summary  Patient name: Jared Murray Medical record number: [id number] Date of birth: [birth year] Age: 37 y.o. Gender: male Date of Admission: 03/16/2013  Date of Discharge: 03/20/2013 Admitting Physician: Nestor Ramp, MD  Primary Care Provider: No PCP Per Patient Consultants: None  Indication for Hospitalization: DKA  Discharge Diagnoses/Problem List:  1. DKA 2. New onset Type 1 Diabetes Mellitus  Disposition: Home  Discharge Condition: Stable  Discharge Exam:  General: conversational, laying in bed comfortable, NAD  Cardiovascular: RRR, no murmurs  Respiratory: CTAB, no wheezes  Abdomen: soft, non-tender, non-distended, +BS  Extremities: Dry, multiple scars on arms and legs, multiple eczematous rashes on arms and legs. Desquamation on feet bilaterally with extremely dry skin.  Psych - alert, oriented, good insight on condition  Brief Hospital Course:   1. DKA - Jared Murray came in with symptoms of abdominal, weight loss and weakness which had progressively worsened over the past 3 weeks after starting an oral prednisone treatment for his eczema. When he came in, his blood glucose was 522 with an anion gap of 31 and positive ketones in urine. Arterial pH: 7.201 with a CO2 of 21.6 and bicarb of 10.7. He was started on glucomander protocol, receiving an insulin drip and fluids. His gap closed the next day and he was started on lantus with sliding scale insulin.  2. New Onset Type 1 Diabetes Mellitus - He received diabetes education. CBGs fluctuated between 100-350 while on insulin regimen with no hypoglycemic events or symptoms. High CBGs were mostly after he missed a dose of insulin one day. He was discharged with lantus and novolog insulin and demonstrated good understanding of how to administer his medication. C-peptide was low at <10 and glutamic acid decarboxylase auto antibodies high at 7.1.  Issues for Follow Up:  1. Titrate  insulin dose 2. Increased nutrition intake - 40-50 lb weight loss over the past year  Significant Procedures: None  Significant Labs and Imaging:   Recent Labs Lab 03/16/13 1620 03/17/13 1130  WBC 7.9 5.1  HGB 18.2* 16.0  HCT 48.7 43.3  PLT 331 267    Recent Labs Lab 03/17/13 1333 03/17/13 1511 03/17/13 1818 03/18/13 0415 03/20/13 0735  NA 128* 128* 129* 133* 129*  K 3.7 3.3* 3.4* 3.2* 3.7  CL 96 95* 96 101 95*  CO2 22 23 23 23 26   GLUCOSE 176* 194* 145* 203* 289*  BUN 5* 5* 5* 4* 3*  CREATININE 0.47* 0.48* 0.46* 0.44* 0.42*  CALCIUM 8.7 8.4 8.5 7.9* 8.3*   C-peptide (7/31): <10 Glutamic acid decarboxylase auto antibodies (8/4): 7.1  Outstanding Results:  1. Insulin antibodies  Discharge Medications:    Medication List    STOP taking these medications       predniSONE 20 MG tablet  Commonly known as:  DELTASONE      TAKE these medications       insulin glargine 100 UNIT/ML injection  Commonly known as:  LANTUS  Inject 0.25 mLs (25 Units total) into the skin daily at 6 (six) AM.     NOVOLOG 100 UNIT/ML injection  Generic drug:  insulin aspart  Inject 2 Units into the skin 3 (three) times daily with meals.     Syringe (Disposable) 1 ML Misc  0.25 mLs by Does not apply route daily.     triamcinolone cream 0.1 %  Commonly known as:  KENALOG  Apply 1 application topically 2 (two) times daily. Applies to legs and  arms     white petrolatum Gel  Commonly known as:  VASELINE  Apply 1 application topically as needed (to skin on arms, legs, feet, chest, abdomen and back).        Discharge Instructions: Please refer to Patient Instructions section of EMR for full details.  Patient was counseled important signs and symptoms that should prompt return to medical care, changes in medications, dietary instructions, activity restrictions, and follow up appointments.   Follow-Up Appointments: Follow-up Information   Follow up with [facility]. (call arrange appointment with clinic. )    Contact information:   [phone] or [phone] [address] [place] [address]      Jacquelin Hawking, MD 03/21/2013, 10:38 PM PGY-1, [facility]

## 2013-05-19 ENCOUNTER — Ambulatory Visit: Payer: PRIVATE HEALTH INSURANCE | Admitting: *Deleted

## 2013-05-23 ENCOUNTER — Ambulatory Visit: Payer: PRIVATE HEALTH INSURANCE

## 2013-06-15 ENCOUNTER — Ambulatory Visit: Payer: Medicaid Other | Attending: Internal Medicine | Admitting: Internal Medicine

## 2013-06-15 DIAGNOSIS — E1110 Type 2 diabetes mellitus with ketoacidosis without coma: Secondary | ICD-10-CM

## 2013-06-15 DIAGNOSIS — L309 Dermatitis, unspecified: Secondary | ICD-10-CM

## 2013-06-15 DIAGNOSIS — N390 Urinary tract infection, site not specified: Secondary | ICD-10-CM

## 2013-06-15 DIAGNOSIS — R739 Hyperglycemia, unspecified: Secondary | ICD-10-CM

## 2013-06-15 LAB — POCT URINALYSIS DIPSTICK
Glucose, UA: 250
Ketones, UA: 40
Spec Grav, UA: 1.015
Urobilinogen, UA: 4

## 2013-06-15 LAB — POCT GLYCOSYLATED HEMOGLOBIN (HGB A1C): Hemoglobin A1C: 11.5

## 2013-06-15 MED ORDER — NOVOLOG 100 UNIT/ML ~~LOC~~ SOLN: SUBCUTANEOUS | Status: DC

## 2013-06-15 MED ORDER — INSULIN ASPART 100 UNIT/ML ~~LOC~~ SOLN
10.0000 [IU] | Freq: Once | SUBCUTANEOUS | Status: AC
  Administered 2013-06-15: 10 [IU] via SUBCUTANEOUS

## 2013-06-15 MED ORDER — CIPROFLOXACIN HCL 250 MG PO TABS: 250.0000 mg | ORAL_TABLET | Freq: Two times a day (BID) | ORAL | Status: DC

## 2013-06-15 MED ORDER — MOMETASONE FUROATE 0.1 % EX CREA: TOPICAL_CREAM | Freq: Two times a day (BID) | CUTANEOUS | Status: DC

## 2013-06-15 MED ORDER — INSULIN GLARGINE 100 UNIT/ML ~~LOC~~ SOLN: 30.0000 [IU] | Freq: Every day | SUBCUTANEOUS | Status: DC

## 2013-06-15 NOTE — Progress Notes (Unsigned)
Patient ID: Jared Murray, male   DOB: Oct 28, 1975, 37 y.o.   MRN: [id number] Patient Demographics  Jared Murray, is a 37 y.o. male  WUJ:[id number]  [id number]:[id number]  DOB - Jan 14, 1976  No chief complaint on file.       Subjective:   [name] today is here to establish primary care.  Patient was diagnosed with diabetes mellitus when he was admitted in July 2014 with DKA Patient has been somewhat noncompliant with his insulin, works third shift, has not been paying attention to his diet. He states he did not go to the diabetic education class. Status CBG was unrecordable, hemoglobin A1c POCT 11  Patient has No headache, No chest pain, No abdominal pain - No Nausea, No new weakness tingling or numbness, No Cough - SOB.   Objective:    There were no vitals filed for this visit.   ALLERGIES:   Allergies  Allergen Reactions  . Penicillins     PAST MEDICAL HISTORY: Past Medical History  Diagnosis Date  . Eczema   . Type 1 diabetes mellitus     PAST SURGICAL HISTORY: Past Surgical History  Procedure Laterality Date  . Finger surgery      FAMILY HISTORY: Family History  Problem Relation Age of Onset  . Hypertension Father   . Diabetes      multiple  . Lupus Cousin   . Stroke Maternal Grandmother   . Stroke Paternal Grandmother     MEDICATIONS AT HOME: Prior to Admission medications   Medication Sig Start Date End Date Taking? Authorizing Provider  ciprofloxacin (CIPRO) 250 MG tablet Take 1 tablet (250 mg total) by mouth 2 (two) times daily. 06/15/13   Ripudeep Jenna Luo, MD  insulin glargine (LANTUS) 100 UNIT/ML injection Inject 0.3 mLs (30 Units total) into the skin daily. 06/15/13   Ripudeep Jenna Luo, MD  mometasone (ELOCON) 0.1 % cream Apply topically 2 (two) times daily. 06/15/13   Ripudeep Jenna Luo, MD  NOVOLOG 100 UNIT/ML injection Sliding scale CBG 70 - 120: 0 units CBG 121 - 150: 1 unit,  CBG 151 - 200: 2 units,  CBG 201 - 250: 3 units,  CBG 251 - 300: 5  units,  CBG 301 - 350: 7 units,  CBG 351 - 400: 9 units   CBG > 400: 10 units 06/15/13   Ripudeep K Rai, MD  Syringe, Disposable, 1 ML MISC 0.25 mLs by Does not apply route daily. 03/20/13   Clare Gandy, MD  triamcinolone cream (KENALOG) 0.1 % Apply 1 application topically 2 (two) times daily. Applies to legs and arms    Historical Provider, MD  white petrolatum (VASELINE) GEL Apply 1 application topically as needed (to skin on arms, legs, feet, chest, abdomen and back). 03/20/13   Jacquelin Hawking, MD    REVIEW OF SYSTEMS:  Constitutional:   No   Fevers, chills, fatigue.  HEENT:    No headaches, Sore throat,   Cardio-vascular: No chest pain,  Orthopnea, swelling in lower extremities, anasarca, palpitations  GI:  No abdominal pain, nausea, vomiting, diarrhea  Resp: No shortness of breath,  No coughing up of blood.No cough.No wheezing.  Skin:  no rash or lesions.  GU:  no dysuria, change in color of urine, no urgency or frequency.  No flank pain.  Musculoskeletal: No joint pain or swelling.  No decreased range of motion.  No back pain.  Psych: No change in mood or affect. No depression or anxiety.  No memory loss.  Exam  General appearance :Awake, alert, NAD, Speech Clear. HEENT: Atraumatic and Normocephalic, PERLA Neck: supple, no JVD. No cervical lymphadenopathy.  Chest: clear to auscultation bilaterally, no wheezing, rales or rhonchi CVS: S1 S2 regular, no murmurs.  Abdomen: soft, NBS, NT, ND, no gaurding, rigidity or rebound. Extremities: No cyanosis, clubbing, B/L Lower Ext shows no edema,  Neurology: Awake alert, and oriented X 3, CN II-XII intact, Non focal Skin:No Rash or lesions Wounds: N/A    Data Review   Basic Metabolic Panel: No results found for this basename: NA, K, CL, CO2, GLUCOSE, BUN, CREATININE, CALCIUM, MG, PHOS,  in the last 168 hours Liver Function Tests: No results found for this basename: AST, ALT, ALKPHOS, BILITOT, PROT, ALBUMIN,  in the  last 168 hours  CBC: No results found for this basename: WBC, NEUTROABS, HGB, HCT, MCV, PLT,  in the last 168 hours ------------------------------------------------------------------------------------------------------------------  Recent Labs  06/15/13 1129  HGBA1C 11.5   ------------------------------------------------------------------------------------------------------------------ No results found for this basename: CHOL, HDL, LDLCALC, TRIG, CHOLHDL, LDLDIRECT,  in the last 72 hours ------------------------------------------------------------------------------------------------------------------ No results found for this basename: TSH, T4TOTAL, FREET3, T3FREE, THYROIDAB,  in the last 72 hours ------------------------------------------------------------------------------------------------------------------ No results found for this basename: VITAMINB12, FOLATE, FERRITIN, TIBC, IRON, RETICCTPCT,  in the last 72 hours  Coagulation profile  No results found for this basename: INR, PROTIME,  in the last 168 hours    Assessment & Plan   Active Problems: 1) diabetes mellitus: Uncontrolled - Increase Lantus to 30 units, explained to the patient to take it in the morning, he works third shift and is noncompliant with his diet and insulin - Explained sliding scale, sensitive for now, he only takes 2 units 3 times a day with meals and also is noncompliant with his diet so am unsure he is actually taking insulin with meals - Ambulatory referral for diabetic education classes - Will give 10 units of insulin now, POCT glucose unrecordable, UA with ketones, patient does not want to go to ER - Explained to the patient to bring a log of his blood sugar readings to next visit  2) polyuria: Likely due to above, has Mild nitrite and leukocytes, placed on ciprofloxacin     Health screening - Flu shot today  Follow-up in  2 weeks   RAI,RIPUDEEP M.D. 06/15/2013, 11:36 AM

## 2013-07-01 ENCOUNTER — Ambulatory Visit: Payer: PRIVATE HEALTH INSURANCE | Attending: Internal Medicine | Admitting: Internal Medicine

## 2013-07-01 ENCOUNTER — Encounter: Payer: Self-pay | Admitting: Internal Medicine

## 2013-07-01 VITALS — BP 131/86 | HR 98 | Temp 97.9°F | Resp 18 | Wt 131.0 lb

## 2013-07-01 DIAGNOSIS — L409 Psoriasis, unspecified: Secondary | ICD-10-CM

## 2013-07-01 DIAGNOSIS — E119 Type 2 diabetes mellitus without complications: Secondary | ICD-10-CM

## 2013-07-01 LAB — TSH: TSH: 2.095 u[IU]/mL (ref 0.350–4.500)

## 2013-07-01 LAB — POCT GLUCOSE (DEVICE FOR HOME USE)

## 2013-07-01 MED ORDER — CLOBETASOL PROPIONATE 0.05 % EX CREA: 1.0000 "application " | TOPICAL_CREAM | Freq: Two times a day (BID) | CUTANEOUS | Status: DC

## 2013-07-01 MED ORDER — CLINDAMYCIN HCL 300 MG PO CAPS: 300.0000 mg | ORAL_CAPSULE | Freq: Three times a day (TID) | ORAL | Status: DC

## 2013-07-01 MED ORDER — INSULIN GLARGINE 100 UNIT/ML ~~LOC~~ SOLN: 45.0000 [IU] | Freq: Every day | SUBCUTANEOUS | Status: DC

## 2013-07-01 MED ORDER — HYDROCODONE-ACETAMINOPHEN 10-325 MG PO TABS: 1.0000 | ORAL_TABLET | Freq: Three times a day (TID) | ORAL | Status: DC | PRN

## 2013-07-01 NOTE — Progress Notes (Unsigned)
Pt is here for a f/u on DM CBG this am = 460 non fasting Also c/o cold sxs onset Wednesday Sxs include: productive cough, vomiting, runny nose Denies: f/d Also c/o dental abscess onset 4 days Hurts to chew Alert w/no signs of acute distress.

## 2013-07-01 NOTE — Progress Notes (Unsigned)
Patient ID: Jared Murray, male   DOB: Jul 02, 1976, 37 y.o.   MRN: [id number]  Patient Demographics  Jared Murray, is a 37 y.o. male  CSN: [id number]  MRN: [id number]  DOB - 1976/02/18  Outpatient Primary MD for the patient is Jeanann Lewandowsky, MD   With History of -  Past Medical History  Diagnosis Date  . Eczema   . Type 1 diabetes mellitus       Past Surgical History  Procedure Laterality Date  . Finger surgery      in for   Chief Complaint  Patient presents with  . Follow-up     HPI  Jared Murray  is a 37 y.o. male, with recent diagnosis of diabetes mellitus type 2 for which he was admitted in DKA to the hospital, diffuse  rash, is here to establish care, his subjective complaints are feeling poorly currently, he is pain and tenderness in one of his left upper molars, his skin rash is diffuse and getting worse, denies any fever chills, no headache, no chest abdominal pain, no shortness of breath. No other subjective complaints the    Review of Systems    In addition to the HPI above,   No Fever-chills, No Headache, No changes with Vision or hearing, No problems swallowing food or Liquids, No Chest pain, Cough or Shortness of Breath, No Abdominal pain, No Nausea or Vommitting, Bowel movements are regular, No Blood in stool or Urine, No dysuria, Worsening of his skin rash mostly in upper extremities No new joints pains-aches, positive to take No new weakness, tingling, numbness in any extremity, No recent weight gain or loss, No polyuria, polydypsia or polyphagia, No significant Mental Stressors.  A full 10 point Review of Systems was done, except as stated above, all other Review of Systems were negative.   Social History History  Substance Use Topics  . Smoking status: Current Every Day Smoker  . Smokeless tobacco: Not on file  . Alcohol Use: Yes     Comment: recently quit - 7/14       Family History Family History  Problem Relation  Age of Onset  . Hypertension Father   . Diabetes      multiple  . Lupus Cousin   . Stroke Maternal Grandmother   . Stroke Paternal Grandmother      Prior to Admission medications   Medication Sig Start Date End Date Taking? Authorizing Provider  insulin glargine (LANTUS) 100 UNIT/ML injection Inject 0.45 mLs (45 Units total) into the skin daily. 07/01/13  Yes Leroy Sea, MD  mometasone (ELOCON) 0.1 % cream Apply topically 2 (two) times daily. 06/15/13  Yes Ripudeep K Rai, MD  NOVOLOG 100 UNIT/ML injection Sliding scale CBG 70 - 120: 0 units CBG 121 - 150: 1 unit,  CBG 151 - 200: 2 units,  CBG 201 - 250: 3 units,  CBG 251 - 300: 5 units,  CBG 301 - 350: 7 units,  CBG 351 - 400: 9 units   CBG > 400: 10 units 06/15/13  Yes Ripudeep K Rai, MD  Syringe, Disposable, 1 ML MISC 0.25 mLs by Does not apply route daily. 03/20/13  Yes Clare Gandy, MD  triamcinolone cream (KENALOG) 0.1 % Apply 1 application topically 2 (two) times daily. Applies to legs and arms   Yes Historical Provider, MD  clindamycin (CLEOCIN) 300 MG capsule Take 1 capsule (300 mg total) by mouth 3 (three) times daily. 07/01/13   Leroy Sea, MD  clobetasol  cream (TEMOVATE) 0.05 % Apply 1 application topically 2 (two) times daily. 07/01/13   Leroy Sea, MD  white petrolatum (VASELINE) GEL Apply 1 application topically as needed (to skin on arms, legs, feet, chest, abdomen and back). 03/20/13   Jacquelin Hawking, MD    Allergies  Allergen Reactions  . Penicillins     Physical Exam  Vitals  Blood pressure 131/86, pulse 98, temperature 97.9 F (36.6 C), temperature source Oral, resp. rate 18, weight 131 lb (59.421 kg), SpO2 100.00%.   1. General Young thin African American male sitting on clinic examination table in no apparent distress,   2. Normal affect and insight, Not Suicidal or Homicidal, Awake Alert, Oriented X 3.  3. No F.N deficits, ALL C.Nerves Intact, Strength 5/5 all 4 extremities, Sensation intact  all 4 extremities, Plantars down going.  4. Ears and Eyes appear Normal, Conjunctivae clear, PERRLA. Moist Oral Mucosa. Left upper molar are has advanced gait is no signs of abscess on my exam  5. Supple Neck, No JVD, No cervical lymphadenopathy appriciated, No Carotid Bruits.  6. Symmetrical Chest wall movement, Good air movement bilaterally, CTAB.  7. RRR, No Gallops, Rubs or Murmurs, No Parasternal Heave.  8. Positive Bowel Sounds, Abdomen Soft, Non tender, No organomegaly appriciated,No rebound -guarding or rigidity.  9.  No Cyanosis, Normal Skin Turgor, he has a plaque ,scaly dry rash mostly on his upper extremities most pronounced on both elbows no surrounding cellulitis  10. Good muscle tone,  joints appear normal , no effusions, Normal ROM.  11. No Palpable Lymph Nodes in Neck or Axillae     Data Review  Lab Results  Component Value Date   WBC 5.1 03/17/2013   HGB 16.0 03/17/2013   HCT 43.3 03/17/2013   MCV 93.3 03/17/2013   PLT 267 03/17/2013      Chemistry      Component Value Date/Time   NA 129* 03/20/2013 0735   K 3.7 03/20/2013 0735   CL 95* 03/20/2013 0735   CO2 26 03/20/2013 0735   BUN 3* 03/20/2013 0735   CREATININE 0.42* 03/20/2013 0735      Component Value Date/Time   CALCIUM 8.3* 03/20/2013 0735       Lab Results  Component Value Date   HGBA1C 11.5 06/15/2013    No results found for this basename: CHOL, HDL, LDLCALC, LDLDIRECT, TRIG, CHOLHDL    Lab Results  Component Value Date   TSH 1.439 03/17/2013    No results found for this basename: PSA         Assessment and plan  Diabetes mellitus2 in poor control, recent A1c 11.- Have increased Lantus to 45 units, have asked him to add 3 units of short-acting NovoLog to his present scale, he has testing supplies and he has been requested to check his CBGs q. a.c. at bedtime maintain a log book and bring it back in one week. Outpatient ophthalmology followup has been requested through a  referral.   Diffuse Psoriasis - placed him on clobetasol ointment, rash is diffuse, dermatology referral made   Left upper molar dental abscess - will place him on clindamycin, short-term narcotics as he generally is in pain from tooth abscess, dental referral made. Orthopedic program ordered.    Instructed to go to ER if his symptoms get worse written instructions given  Routine health maintenance.  Flu shot given    Leroy Sea M.D on 07/01/2013 at 9:40 AM

## 2013-07-01 NOTE — Patient Instructions (Signed)
Accuchecks 4 times/day, Once in AM empty stomach and then before each meal. Log in all results and show them to your Prim.MD in 7 days. If any glucose reading is under 80 or above 300 call your Prim MD immidiately. Follow Low glucose instructions for glucose under 80 as instructed.    His symptoms get worse, her sugars run more than 400 persistent basis, urine fevers, nausea vomiting or diarrhea. Go to ER.

## 2013-07-02 ENCOUNTER — Encounter (HOSPITAL_COMMUNITY): Payer: Self-pay | Admitting: *Deleted

## 2013-07-02 ENCOUNTER — Inpatient Hospital Stay (HOSPITAL_COMMUNITY): Payer: Medicaid Other

## 2013-07-02 ENCOUNTER — Inpatient Hospital Stay (HOSPITAL_COMMUNITY)
Admission: EM | Admit: 2013-07-02 | Discharge: 2013-07-05 | DRG: 157 | Disposition: A | Discharge status: Home / Self Care | Payer: Medicaid Other | Attending: Internal Medicine | Admitting: Internal Medicine

## 2013-07-02 DIAGNOSIS — K05219 Aggressive periodontitis, localized, unspecified severity: Secondary | ICD-10-CM

## 2013-07-02 DIAGNOSIS — Z91199 Patient's noncompliance with other medical treatment and regimen due to unspecified reason: Secondary | ICD-10-CM

## 2013-07-02 DIAGNOSIS — R112 Nausea with vomiting, unspecified: Secondary | ICD-10-CM

## 2013-07-02 DIAGNOSIS — L409 Psoriasis, unspecified: Secondary | ICD-10-CM | POA: Diagnosis present

## 2013-07-02 DIAGNOSIS — Z9119 Patient's noncompliance with other medical treatment and regimen: Secondary | ICD-10-CM

## 2013-07-02 DIAGNOSIS — K047 Periapical abscess without sinus: Secondary | ICD-10-CM

## 2013-07-02 DIAGNOSIS — E876 Hypokalemia: Secondary | ICD-10-CM | POA: Diagnosis present

## 2013-07-02 DIAGNOSIS — Z794 Long term (current) use of insulin: Secondary | ICD-10-CM

## 2013-07-02 DIAGNOSIS — D45 Polycythemia vera: Secondary | ICD-10-CM | POA: Diagnosis present

## 2013-07-02 DIAGNOSIS — E1110 Type 2 diabetes mellitus with ketoacidosis without coma: Secondary | ICD-10-CM

## 2013-07-02 DIAGNOSIS — D709 Neutropenia, unspecified: Secondary | ICD-10-CM | POA: Diagnosis present

## 2013-07-02 DIAGNOSIS — L408 Other psoriasis: Secondary | ICD-10-CM | POA: Diagnosis present

## 2013-07-02 DIAGNOSIS — L259 Unspecified contact dermatitis, unspecified cause: Secondary | ICD-10-CM | POA: Diagnosis present

## 2013-07-02 DIAGNOSIS — E860 Dehydration: Secondary | ICD-10-CM

## 2013-07-02 DIAGNOSIS — E41 Nutritional marasmus: Secondary | ICD-10-CM | POA: Diagnosis present

## 2013-07-02 DIAGNOSIS — F1210 Cannabis abuse, uncomplicated: Secondary | ICD-10-CM | POA: Diagnosis present

## 2013-07-02 DIAGNOSIS — E1010 Type 1 diabetes mellitus with ketoacidosis without coma: Secondary | ICD-10-CM | POA: Diagnosis present

## 2013-07-02 DIAGNOSIS — F17200 Nicotine dependence, unspecified, uncomplicated: Secondary | ICD-10-CM | POA: Diagnosis present

## 2013-07-02 DIAGNOSIS — Z681 Body mass index (BMI) 19 or less, adult: Secondary | ICD-10-CM

## 2013-07-02 LAB — URINALYSIS, ROUTINE W REFLEX MICROSCOPIC
Bilirubin Urine: NEGATIVE
Glucose, UA: >1000 mg/dL — AB
Specific Gravity, Urine: 1.026 (ref 1.005–1.030)
pH: 5 (ref 5.0–8.0)

## 2013-07-02 LAB — CBC
HCT: 45 % (ref 39.0–52.0)
Hemoglobin: 16.5 g/dL (ref 13.0–17.0)
MCH: 34.4 pg — ABNORMAL HIGH (ref 26.0–34.0)
MCHC: 36.7 g/dL — ABNORMAL HIGH (ref 30.0–36.0)
MCV: 93.8 fL (ref 78.0–100.0)
RBC: 4.8 MIL/uL (ref 4.22–5.81)
RDW: 13.2 % (ref 11.5–15.5)

## 2013-07-02 LAB — HEPATIC FUNCTION PANEL
AST: 13 U/L (ref 0–37)
Albumin: 3.3 g/dL — ABNORMAL LOW (ref 3.5–5.2)
Alkaline Phosphatase: 103 U/L (ref 39–117)
Bilirubin, Direct: 0.1 mg/dL (ref 0.0–0.3)
Total Bilirubin: 0.4 mg/dL (ref 0.3–1.2)

## 2013-07-02 LAB — BASIC METABOLIC PANEL
BUN: 8 mg/dL (ref 6–23)
BUN: 6 mg/dL (ref 6–23)
CO2: <7 mEq/L — CL (ref 19–32)
CO2: 11 mEq/L — ABNORMAL LOW (ref 19–32)
CO2: 15 mEq/L — ABNORMAL LOW (ref 19–32)
Calcium: 8.2 mg/dL — ABNORMAL LOW (ref 8.4–10.5)
Calcium: 8.6 mg/dL (ref 8.4–10.5)
Calcium: 8.4 mg/dL (ref 8.4–10.5)
Chloride: 104 mEq/L (ref 96–112)
Chloride: 105 mEq/L (ref 96–112)
Creatinine, Ser: 0.89 mg/dL (ref 0.50–1.35)
Creatinine, Ser: 0.72 mg/dL (ref 0.50–1.35)
Creatinine, Ser: 0.52 mg/dL (ref 0.50–1.35)
Creatinine, Ser: 0.56 mg/dL (ref 0.50–1.35)
GFR calc Af Amer: >90 mL/min (ref >90)
GFR calc Af Amer: >90 mL/min (ref >90)
GFR calc non Af Amer: >90 mL/min (ref >90)
Glucose, Bld: 192 mg/dL — ABNORMAL HIGH (ref 70–99)
Glucose, Bld: 177 mg/dL — ABNORMAL HIGH (ref 70–99)
Potassium: 3.8 mEq/L (ref 3.5–5.1)
Sodium: 133 mEq/L — ABNORMAL LOW (ref 135–145)
Sodium: 137 mEq/L (ref 135–145)
Sodium: 136 mEq/L (ref 135–145)

## 2013-07-02 LAB — GLUCOSE, CAPILLARY
Glucose-Capillary: 119 mg/dL — ABNORMAL HIGH (ref 70–99)
Glucose-Capillary: 122 mg/dL — ABNORMAL HIGH (ref 70–99)
Glucose-Capillary: 134 mg/dL — ABNORMAL HIGH (ref 70–99)
Glucose-Capillary: 156 mg/dL — ABNORMAL HIGH (ref 70–99)
Glucose-Capillary: 169 mg/dL — ABNORMAL HIGH (ref 70–99)
Glucose-Capillary: 170 mg/dL — ABNORMAL HIGH (ref 70–99)
Glucose-Capillary: 211 mg/dL — ABNORMAL HIGH (ref 70–99)
Glucose-Capillary: 361 mg/dL — ABNORMAL HIGH (ref 70–99)
Glucose-Capillary: 432 mg/dL — ABNORMAL HIGH (ref 70–99)

## 2013-07-02 LAB — CBC WITH DIFFERENTIAL/PLATELET
Basophils Absolute: 0 10*3/uL (ref 0.0–0.1)
Eosinophils Absolute: 0 10*3/uL (ref 0.0–0.7)
Eosinophils Relative: 0 % (ref 0–5)
HCT: 50.4 % (ref 39.0–52.0)
Hemoglobin: 18.2 g/dL — ABNORMAL HIGH (ref 13.0–17.0)
Lymphocytes Relative: 4 % — ABNORMAL LOW (ref 12–46)
Lymphs Abs: 0.4 10*3/uL — ABNORMAL LOW (ref 0.7–4.0)
MCH: 34.6 pg — ABNORMAL HIGH (ref 26.0–34.0)
MCV: 95.8 fL (ref 78.0–100.0)
Monocytes Absolute: 0.6 10*3/uL (ref 0.1–1.0)
Monocytes Relative: 6 % (ref 3–12)
Neutro Abs: 9.5 10*3/uL — ABNORMAL HIGH (ref 1.7–7.7)
RDW: 13.3 % (ref 11.5–15.5)
WBC: 10.5 10*3/uL (ref 4.0–10.5)

## 2013-07-02 LAB — POCT I-STAT 3, VENOUS BLOOD GAS (G3P V)
Acid-base deficit: 19 mmol/L — ABNORMAL HIGH (ref 0.0–2.0)
O2 Saturation: 68 %
TCO2: 9 mmol/L (ref 0–100)
pCO2, Ven: 26.1 mmHg — ABNORMAL LOW (ref 45.0–50.0)

## 2013-07-02 LAB — POCT I-STAT, CHEM 8
BUN: 12 mg/dL (ref 6–23)
Calcium, Ion: 1.13 mmol/L (ref 1.12–1.23)
Creatinine, Ser: 1 mg/dL (ref 0.50–1.35)
HCT: 58 % — ABNORMAL HIGH (ref 39.0–52.0)
Hemoglobin: 19.7 g/dL — ABNORMAL HIGH (ref 13.0–17.0)
Potassium: 4.8 mEq/L (ref 3.5–5.1)
Sodium: 132 mEq/L — ABNORMAL LOW (ref 135–145)
TCO2: 10 mmol/L (ref 0–100)

## 2013-07-02 LAB — URINE MICROSCOPIC-ADD ON

## 2013-07-02 LAB — POCT I-STAT 3, ART BLOOD GAS (G3+)
Bicarbonate: 4.3 mEq/L — ABNORMAL LOW (ref 20.0–24.0)
Patient temperature: 98.6
pCO2 arterial: 13.9 mmHg — CL (ref 35.0–45.0)
pH, Arterial: 7.095 — CL (ref 7.350–7.450)
pO2, Arterial: 137 mmHg — ABNORMAL HIGH (ref 80.0–100.0)

## 2013-07-02 LAB — RAPID URINE DRUG SCREEN, HOSP PERFORMED
Barbiturates: NOT DETECTED
Benzodiazepines: NOT DETECTED
Cocaine: NOT DETECTED
Opiates: POSITIVE — AB
Tetrahydrocannabinol: NOT DETECTED

## 2013-07-02 LAB — MRSA PCR SCREENING: MRSA by PCR: NEGATIVE

## 2013-07-02 LAB — POCT I-STAT TROPONIN I

## 2013-07-02 LAB — KETONES, QUALITATIVE

## 2013-07-02 MED ORDER — POTASSIUM CHLORIDE 10 MEQ/100ML IV SOLN
10.0000 meq | INTRAVENOUS | Status: AC
Start: 2013-07-02 — End: 2013-07-02
  Administered 2013-07-02 (×2): 10 meq via INTRAVENOUS
  Filled 2013-07-02 (×2): qty 100

## 2013-07-02 MED ORDER — ONDANSETRON HCL 4 MG/2ML IJ SOLN
4.0000 mg | Freq: Four times a day (QID) | INTRAMUSCULAR | Status: DC | PRN
  Administered 2013-07-02: 4 mg via INTRAVENOUS
  Filled 2013-07-02: qty 2

## 2013-07-02 MED ORDER — ONDANSETRON HCL 4 MG PO TABS: 4.0000 mg | ORAL_TABLET | Freq: Four times a day (QID) | ORAL | Status: DC | PRN

## 2013-07-02 MED ORDER — SODIUM CHLORIDE 0.9 % IV BOLUS (SEPSIS)
2000.0000 mL | Freq: Once | INTRAVENOUS | Status: AC
  Administered 2013-07-02: 2000 mL via INTRAVENOUS

## 2013-07-02 MED ORDER — ONDANSETRON HCL 4 MG/2ML IJ SOLN: 4.0000 mg | Freq: Four times a day (QID) | INTRAMUSCULAR | Status: DC | PRN

## 2013-07-02 MED ORDER — ONDANSETRON HCL 4 MG/2ML IJ SOLN
4.0000 mg | Freq: Once | INTRAMUSCULAR | Status: AC
  Administered 2013-07-02: 4 mg via INTRAVENOUS
  Filled 2013-07-02: qty 2

## 2013-07-02 MED ORDER — SODIUM CHLORIDE 0.9 % IV BOLUS (SEPSIS): 1000.0000 mL | Freq: Once | INTRAVENOUS | Status: DC

## 2013-07-02 MED ORDER — SODIUM CHLORIDE 0.9 % IV BOLUS (SEPSIS)
1000.0000 mL | INTRAVENOUS | Status: AC
  Administered 2013-07-02: 1000 mL via INTRAVENOUS

## 2013-07-02 MED ORDER — HEPARIN SODIUM (PORCINE) 5000 UNIT/ML IJ SOLN
5000.0000 [IU] | Freq: Three times a day (TID) | INTRAMUSCULAR | Status: DC
  Administered 2013-07-02 – 2013-07-05 (×9): 5000 [IU] via SUBCUTANEOUS
  Filled 2013-07-02 (×13): qty 1

## 2013-07-02 MED ORDER — SODIUM CHLORIDE 0.9 % IV SOLN
INTRAVENOUS | Status: AC
  Administered 2013-07-02: 3.7 [IU]/h via INTRAVENOUS
  Filled 2013-07-02 (×2): qty 1

## 2013-07-02 MED ORDER — SODIUM CHLORIDE 0.9 % IV BOLUS (SEPSIS)
1000.0000 mL | Freq: Once | INTRAVENOUS | Status: AC
  Administered 2013-07-02: 1000 mL via INTRAVENOUS

## 2013-07-02 MED ORDER — SODIUM CHLORIDE 0.9 % IV SOLN: INTRAVENOUS | Status: DC

## 2013-07-02 MED ORDER — DEXTROSE-NACL 5-0.45 % IV SOLN
INTRAVENOUS | Status: AC
  Administered 2013-07-02: 75 mL via INTRAVENOUS

## 2013-07-02 MED ORDER — DEXTROSE 50 % IV SOLN: 25.0000 mL | INTRAVENOUS | Status: DC | PRN

## 2013-07-02 MED ORDER — MORPHINE SULFATE 2 MG/ML IJ SOLN
1.0000 mg | INTRAMUSCULAR | Status: DC | PRN
  Administered 2013-07-02 – 2013-07-03 (×4): 1 mg via INTRAVENOUS
  Filled 2013-07-02 (×4): qty 1

## 2013-07-02 MED ORDER — MORPHINE SULFATE 4 MG/ML IJ SOLN
4.0000 mg | Freq: Once | INTRAMUSCULAR | Status: AC
  Administered 2013-07-02: 4 mg via INTRAVENOUS
  Filled 2013-07-02: qty 1

## 2013-07-02 MED ORDER — HEPARIN SODIUM (PORCINE) 5000 UNIT/ML IJ SOLN: 5000.0000 [IU] | Freq: Three times a day (TID) | INTRAMUSCULAR | Status: DC

## 2013-07-02 MED ORDER — CLINDAMYCIN PHOSPHATE 600 MG/50ML IV SOLN
600.0000 mg | Freq: Once | INTRAVENOUS | Status: AC
  Administered 2013-07-02: 600 mg via INTRAVENOUS
  Filled 2013-07-02: qty 50

## 2013-07-02 MED ORDER — INSULIN ASPART 100 UNIT/ML ~~LOC~~ SOLN: 10.0000 [IU] | Freq: Once | SUBCUTANEOUS | Status: DC

## 2013-07-02 MED ORDER — SODIUM CHLORIDE 0.9 % IV SOLN: INTRAVENOUS | Status: AC

## 2013-07-02 MED ORDER — SODIUM CHLORIDE 0.9 % IJ SOLN
3.0000 mL | Freq: Two times a day (BID) | INTRAMUSCULAR | Status: DC
  Administered 2013-07-02 – 2013-07-04 (×3): 3 mL via INTRAVENOUS

## 2013-07-02 NOTE — ED Notes (Signed)
Spoke with admitting Dr. Christen Bame, requesting to start insulin drip now.

## 2013-07-02 NOTE — ED Notes (Signed)
Critical care at the bedside. 

## 2013-07-02 NOTE — Progress Notes (Signed)
Weekend CSW received a call from Nursing Secretary that patient's sister Jared Murray requested to speak with CSW. CSW entered patient's room and introduced herself to patient, his sister, and his wife. Patient's sister requested information on applying for disability, CSW directed family to apply through the [employer] office. Patient's sister thanked CSW for her assistance.   Samuella Bruin, MSW, LCSWA Clinical Social Worker [facility] Emergency [facility]. [phone]

## 2013-07-02 NOTE — Progress Notes (Signed)
Pt arrived from ED, on ins gtt, VSS, will continue to monitor.

## 2013-07-02 NOTE — Consult Note (Signed)
PULMONARY  / CRITICAL CARE MEDICINE  Name: Jared Murray MRN: [id number] DOB: 11/04/1975    ADMISSION DATE:  07/02/2013  REFERRING MD :  EDP PRIMARY SERVICE: IM clinic   CHIEF COMPLAINT:  DKA   BRIEF PATIENT DESCRIPTION:  37 yo smoker Type 1 DM (dx 02/2013) presented to ER 11/15 with 1 week hx of n/v and dental abscess found to be in DKA . Hx of polysubstance abuse (THC , ETOH and cigs) . Noncompliant with Insulin. PCCM consult 11/15  SIGNIFICANT EVENTS / STUDIES:  Admit from ER for DKA . PH 7.09/HCO3 -4.3   LINES / TUBES:   CULTURES:   ANTIBIOTICS: 11/15 Clindamycin     HISTORY OF PRESENT ILLNESS:   37 yo smoker Type 1 DM (dx 02/2013) presented to ER 11/15 with 1 week hx of n/v and dental abscess found to be in DKA . Hx of polysubstance abuse (THC , ETOH and cigs) . Noncompliant with Insulin. Pt is accompanied by family- wife/ sister in ER. Over last week with n/v, cold like symtoms and severe dental pain. Seen at IM clinic at [facility] yesterday with dental pain, started on Cleocin. BS were high ~400. His insuin was adjusted. Got worse overnight  Called EMS , Paramedics noted that his blood sugar was 502 mg/dL on route to the emergency department. Patient denies fever. He has not seen dentist ,  Has severe gingivitis and multiple dental caries, broken teeth , Severe tooth pain on left upper molar.   family says he does not eat well, does not take insulin. Smokes daily . Also smokes marajuana often. Drinks 3+ beers daily .  Has had n/v on/off for 1 week.  No chest pain, recent travel, abdominal pain , orthopnea, fever, edema .  Polyuria. And polydipsia + .  Has severe plaque psorarsis , uses topical creams only. Has Dermatology referral pending. No recent steroids .  In ER VSS w/ good b/p. And remains afebrile. Has good UOP and nml scr.  BS ~500. Has been started on IV clindamycin.  IVF NS bolus infusing.    PAST MEDICAL HISTORY :  Past Medical History  Diagnosis Date  .  Eczema   . Type 1 diabetes mellitus    Past Surgical History  Procedure Laterality Date  . Finger surgery     Prior to Admission medications   Medication Sig Start Date End Date Taking? Authorizing Provider  clindamycin (CLEOCIN) 300 MG capsule Take 1 capsule (300 mg total) by mouth 3 (three) times daily. 07/01/13  Yes Leroy Sea, MD  HYDROcodone-acetaminophen (NORCO) 10-325 MG per tablet Take 1 tablet by mouth every 8 (eight) hours as needed. 07/01/13  Yes Leroy Sea, MD  insulin glargine (LANTUS) 100 UNIT/ML injection Inject 0.45 mLs (45 Units total) into the skin daily. 07/01/13  Yes Leroy Sea, MD  NOVOLOG 100 UNIT/ML injection Sliding scale CBG 70 - 120: 0 units CBG 121 - 150: 1 unit,  CBG 151 - 200: 2 units,  CBG 201 - 250: 3 units,  CBG 251 - 300: 5 units,  CBG 301 - 350: 7 units,  CBG 351 - 400: 9 units   CBG > 400: 10 units 06/15/13  Yes Ripudeep K Rai, MD  clobetasol cream (TEMOVATE) 0.05 % Apply 1 application topically 2 (two) times daily. 07/01/13   Leroy Sea, MD  Syringe, Disposable, 1 ML MISC 0.25 mLs by Does not apply route daily. 03/20/13   Clare Gandy, MD   Allergies  Allergen Reactions  . Penicillins     Childhood allergy    FAMILY HISTORY:  Family History  Problem Relation Age of Onset  . Hypertension Father   . Diabetes      multiple  . Lupus Cousin   . Stroke Maternal Grandmother   . Stroke Paternal Grandmother    SOCIAL HISTORY:  reports that he has been smoking.  He does not have any smokeless tobacco history on file. He reports that he drinks alcohol. He reports that he uses illicit drugs (Marijuana).  REVIEW OF SYSTEMS:   Constitutional:   + weight loss, NO  night sweats,  Fevers, chills, + fatigue, or  lassitude.  HEENT:   No headaches,  Difficulty swallowing,   ++Tooth/dental problems,                No sneezing, itching, ear ache, nasal congestion, post nasal drip,   CV:  No chest pain,  Orthopnea, PND, swelling in lower  extremities, anasarca, dizziness, palpitations, syncope.   GI  No heartburn, indigestion, abdominal pain,  ++nausea, vomiting,  NO diarrhea, change in bowel habits, loss of appetite, bloody stools.   Resp: No shortness of breath with exertion or at rest.  No excess mucus, no productive cough,  No non-productive cough,  No coughing up of blood.  No change in color of mucus.  No wheezing.  No chest wall deformity  Skin: severe psoriasis   GU: no dysuria, change in color of urine, no urgency or  ++frequency.  No flank pain, no hematuria   MS:  No joint pain or swelling.  No decreased range of motion.  No back pain.  Psych:  No change in mood or affect. No depression or anxiety.  No memory loss.  SUBJECTIVE:  Feels very weak, teeth pain and nausea   VITAL SIGNS: Temp:  [97.4 F (36.3 C)-97.8 F (36.6 C)] 97.8 F (36.6 C) (11/15 0953) Pulse Rate:  [94-120] 94 (11/15 0953) Resp:  [16-20] 16 (11/15 0953) BP: (144-156)/(90-96) 144/90 mmHg (11/15 0953) SpO2:  [99 %-100 %] 100 % (11/15 0953) HEMODYNAMICS:   VENTILATOR SETTINGS:   INTAKE / OUTPUT: Intake/Output     11/14 0701 - 11/15 0700 11/15 0701 - 11/16 0700   Urine  300   Total Output   300   Net   -300          PHYSICAL EXAMINATION: General:  Thin AAM , appears uncomfortable  Neuro:  A/O x 3 , MAEWx 4 , F/C  HEENT:  Atraumatic /Normoceph. , very poor dentition with obvious gingivitis, broken teeth and dental caries, ?abscess ant maxilla  Cardiovascular:  ST , no m/r/g  Lungs:  CTA bilaterally, no wheezing  Abdomen:  Flat, soft, NT with BS + Musculoskeletal:  MAEW x 4 , no edema or joint swelling  Skin:  Severe diffuse plaque Psorasis   LABS:  CBC  Recent Labs Lab 07/02/13 0648 07/02/13 0701  WBC 10.5  --   HGB 18.2* 19.7*  HCT 50.4 58.0*  PLT 229  --    Coag's No results found for this basename: APTT, INR,  in the last 168 hours BMET  Recent Labs Lab 07/02/13 0648 07/02/13 0701  NA 133* 132*  K 4.9  4.8  CL 85* 99  CO2 <7*  --   BUN 11 12  CREATININE 0.89 1.00  GLUCOSE 490* 496*   Electrolytes  Recent Labs Lab 07/02/13 0648  CALCIUM 9.6   Sepsis Markers  Recent  Labs Lab 07/02/13 0645  LATICACIDVEN 2.4*   ABG  Recent Labs Lab 07/02/13 0833  PHART 7.095*  PCO2ART 13.9*  PO2ART 137.0*   Liver Enzymes No results found for this basename: AST, ALT, ALKPHOS, BILITOT, ALBUMIN,  in the last 168 hours Cardiac Enzymes No results found for this basename: TROPONINI, PROBNP,  in the last 168 hours Glucose  Recent Labs Lab 07/02/13 0643 07/02/13 0802 07/02/13 0905 07/02/13 1008  GLUCAP 521* 432* 361* 277*    Imaging No results found.   CXR: Pending   ASSESSMENT / PLAN:  ENDOCRINE A:  DKA   -diagnosed IDDM 02/2013 Last A1C 11.5 06/15/13  On 45 u lantus at home P:   Insulin drip /DKA protocol - once CBG < 250, add dextrose Allow PO water intake - received 3L fluids Monitor AG - overlap sq lantus with insulin gtt x 2h when gap closes   PULMONARY A: Smoker  P:   Monitor  O2 As needed to keep sats >90%  cXR clear  CARDIOVASCULAR A: ST  Troponin neg x 1  P:  Monitor SDU   Monitor electrolytes closely  Hep DVT   RENAL A:  Metabolic Acidosis  11/15 : Lactate 2.4 P:   Replace electrolytes as indicated  IVF Resus.     GASTROINTESTINAL A:  N/V suspect secondary to DKA/dental infection  P:   Zofran As needed   Fluid resuscitation  Check LFT   HEMATOLOGIC A:  Polycythemia - hemoconc P:  Expect to drop once hydrated HIV pending   INFECTIOUS A:  Dental Abscess  P:   Cont IV clindamycin  For I/D -dental FU as outpt   NEUROLOGIC A:  Acute Pain  Hx of polysubstance /etoh/THC  >tox screen + opiates, neg THC P:   Pain meds As needed   Limit sedating rx  Monitor for w/d    TODAY'S SUMMARY: DKA - responding to herapy, OK to admit to SDU under IMTS. Pl call us for questions , if course does not follow usual pattern   The patient is  critically ill with multiple organ systems failure and requires high complexity decision making for assessment and support, frequent evaluation and titration of therapies, application of advanced monitoring technologies and extensive interpretation of multiple databases. Critical Care Time devoted to patient care services described in this note is 40  minutes.   PARRETT,TAMMY NP-C  Oretha Milch  Pulmonary and Critical Care Medicine [facility] Pager: [phone]  07/02/2013, 10:12 AM

## 2013-07-02 NOTE — Progress Notes (Signed)
Utilization review completed.  P.J. Alyissa Whidbee,RN,BSN Case Manager [phone]  

## 2013-07-02 NOTE — ED Notes (Signed)
Patient called EMS due to nausea and vomiting coupled with high blood glucose reading. Patient has been taking antibiotic to treat tooth abscess.

## 2013-07-02 NOTE — Progress Notes (Signed)
Nursing Dr. Manson Passey notified of CO2 level and cbg's.

## 2013-07-02 NOTE — H&P (Signed)
Date: 07/02/2013               Patient Name:  Jared Murray MRN: [id number]  DOB: [birth year] Age / Sex: 37 y.o., male   PCP: Jared Lewandowsky, MD         Medical Service: Internal Medicine Teaching Service         Attending Physician: Dr. Brandt Loosen, MD    First Contact: Dr. Evelena Murray Pager: [phone]  Second Contact: Dr. Christen Murray Pager: [phone]       After Hours (After 5p/  First Contact Pager: [phone]  weekends / holidays): Second Contact Pager: [phone]   Chief Complaint: N/V and decreased po  History of Present Illness:  Jared Murray presents with a two day history of N/V, decreased po.  He reports he has ongoing tooth pain with significantly increased pain since yesterday.  He was recently diagnosed with DM type 1 after a DKA admission (07/30-08/04/14).  He reports compliance with his insulin regimen.  He takes Lantus and SSI at home and reports he last took 7 units insulin yesterday morning.  He denies fever/chills/diarrhea.  His sister reports he has had polyuria and was recently treated for a UTI.    Meds: Current Facility-Administered Medications  Medication Dose Route Frequency Provider Last Rate Last Dose  . insulin regular (NOVOLIN R,HUMULIN R) 1 Units/mL in sodium chloride 0.9 % 100 mL infusion   Intravenous Continuous Jared Loosen, MD      . sodium chloride 0.9 % bolus 1,000 mL  1,000 mL Intravenous Once Jared Bame, MD       Current Outpatient Prescriptions  Medication Sig Dispense Refill  . clindamycin (CLEOCIN) 300 MG capsule Take 1 capsule (300 mg total) by mouth 3 (three) times daily.  20 capsule  0  . clobetasol cream (TEMOVATE) 0.05 % Apply 1 application topically 2 (two) times daily.  30 g  2  . HYDROcodone-acetaminophen (NORCO) 10-325 MG per tablet Take 1 tablet by mouth every 8 (eight) hours as needed.  20 tablet  0  . insulin glargine (LANTUS) 100 UNIT/ML injection Inject 0.45 mLs (45 Units total) into the skin daily.  10 mL  12  . mometasone  (ELOCON) 0.1 % cream Apply topically 2 (two) times daily.  45 g  2  . NOVOLOG 100 UNIT/ML injection Sliding scale CBG 70 - 120: 0 units CBG 121 - 150: 1 unit,  CBG 151 - 200: 2 units,  CBG 201 - 250: 3 units,  CBG 251 - 300: 5 units,  CBG 301 - 350: 7 units,  CBG 351 - 400: 9 units   CBG > 400: 10 units  1 vial  12  . Syringe, Disposable, 1 ML MISC 0.25 mLs by Does not apply route daily.  100 each  0  . triamcinolone cream (KENALOG) 0.1 % Apply 1 application topically 2 (two) times daily. Applies to legs and arms      . white petrolatum (VASELINE) GEL Apply 1 application topically as needed (to skin on arms, legs, feet, chest, abdomen and back).  106 g  0    Allergies: Allergies as of 07/02/2013 - Review Complete 07/02/2013  Allergen Reaction Noted  . Penicillins  03/16/2013   Past Medical History  Diagnosis Date  . Eczema   . Type 1 diabetes mellitus    Past Surgical History  Procedure Laterality Date  . Finger surgery     Family History  Problem Relation Age of Onset  . Hypertension  Father   . Diabetes      multiple  . Lupus Cousin   . Stroke Maternal Grandmother   . Stroke Paternal Grandmother    History   Social History  . Marital Status: Married    Spouse Name: N/A    Number of Children: N/A  . Years of Education: N/A   Occupational History  . Not on file.   Social History Main Topics  . Smoking status: Current Every Day Smoker  . Smokeless tobacco: Not on file  . Alcohol Use: Yes     Comment: recently quit - 7/14   . Drug Use: Yes    Special: Marijuana  . Sexual Activity: Not on file   Other Topics Concern  . 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  Physical Exam: Blood pressure 156/96, pulse 120, temperature 97.4 F (36.3 C), temperature source Oral, resp. rate 20, SpO2 100.00%. General: resting in bed in mild distress HEENT: poor  dentition, abscess noted anterior maxilla, collection on palate Cardiac: tachycardic, regular rhythm, no rubs, murmurs or gallops Pulm: clear to auscultation bilaterally, + Kussmaul's respirations Abd: soft, nontender, nondistended, BS present Ext: warm and well perfused, no pedal edema, scaly plaque psoriasis on extremities especially severe on feet Neuro: alert and oriented X3, mentating appropriately, able to speak and follow commands  Lab results: Basic Metabolic Panel:  Recent Labs  40/98/11 0648 07/02/13 0701  NA 133* 132*  K 4.9 4.8  CL 85* 99  CO2 <7*  --   GLUCOSE 490* 496*  BUN 11 12  CREATININE 0.89 1.00  CALCIUM 9.6  --    CBC:  Recent Labs  07/02/13 0648 07/02/13 0701  WBC 10.5  --   NEUTROABS 9.5*  --   HGB 18.2* 19.7*  HCT 50.4 58.0*  MCV 95.8  --   PLT 229  --    CBG:  Recent Labs  07/02/13 0643  GLUCAP 521*   Thyroid Function Tests:  Recent Labs  07/01/13 0943  TSH 2.095   Imaging results:  No results found.  Other results: EKG: sinus tachycardia  Assessment & Plan by Problem: 37 year old male with recently diagnosed DM type 1 and psoriasis presenting with two day history of N/V and decreased po.    #DKA - poc BG 460 in the ED.  Metabolic acidosis with pH of 7.095, bicarb 4.3.  Etiology likely dental abscess.   ST changes on EKG but no CP or significant cardiac risk factors and poc Trop negative making MI less likely.  No URI symptoms and CXR negative for PNA.  UA negative for UTI. - admit to SDU - NSS 46mL/hr - D5 - 1/2NSS 31mL/hr - insulin gtt - BMET q2hr, monitor AG and K - Zofran prn, morphine prn  #Dental abscess - the patient reports having previously planned to get all of his teeth removed.  He was seen yesterday by his PCP and referred to dentist.  He reports the abscess has been causing him increased pain for the past few days.   - I&D - clindamycin  #Psoriasis - The patient reports a long history of what he was told was  eczema.  However, he says he was recently diagnosed with psoriasis.  He has extremely scaly plaques on the extremities, particularly on his ankles and feet.  He has been  referred to Dermatology by his PCP. - outpatient Derm follow-up   #DVT ppx - [place] heparin   Dispo: Disposition is deferred at this time, awaiting improvement of current medical problems. Anticipated discharge in approximately 2-3 day(s).   The patient does have a current PCP (Jared Lewandowsky, MD) and does need an [facility] hospital follow-up appointment after discharge.  The patient does not know have transportation limitations that hinder transportation to clinic appointments.  Signed: Evelena Peat, DO 07/02/2013, 7:56 AM

## 2013-07-02 NOTE — ED Provider Notes (Signed)
CSN: [id number]     Arrival date & time 07/02/13  4098 History   First MD Initiated Contact with Patient 07/02/13 [phone]     Chief Complaint  Patient presents with  . Hyperglycemia  . Nausea  . Emesis  . Oral Swelling   (Consider location/radiation/quality/duration/timing/severity/associated sxs/prior Treatment) HPI This patient is a poorly compliant type II insulin-dependent diabetic man with a history of diabetic ketoacidosis. He presents with complaints of general malaise with nausea, vomiting and tooth pain. His symptoms began 2 days ago with tooth pain. He saw his endocrinologist yesterday.  He has not taken any insulin for over 24 hours. He has had about 20 hours of nausea, vomiting and inability to tolerate by mouth intake. He says that he has an abscess the roof of his mouth. He leads that this is the cause of his symptoms.  Paramedics noted that his blood sugar was 502 mg/dL on route to the emergency department. The patient takes both Lantus every morning and sliding scale insulin. He has not had either every 24 hours.   Patient denies fever.   His mouth pain is 10/10. Denies pain in any other region. No SOB. No abdominal pain. No diarrhea. Denies alcohol and recreational drug use. Denies dysuria.   Past Medical History  Diagnosis Date  . Eczema   . Type 1 diabetes mellitus    Past Surgical History  Procedure Laterality Date  . Finger surgery     Family History  Problem Relation Age of Onset  . Hypertension Father   . Diabetes      multiple  . Lupus Cousin   . Stroke Maternal Grandmother   . Stroke Paternal Grandmother    History  Substance Use Topics  . Smoking status: Current Every Day Smoker  . Smokeless tobacco: Not on file  . Alcohol Use: Yes     Comment: recently quit - 7/14     Review of Systems 10 point review of systems obtained and is negative with the exception of symptoms noted above and chronic severe psoriasis  Allergies  Penicillins  Home  Medications   Current Outpatient Rx  Name  Route  Sig  Dispense  Refill  . clindamycin (CLEOCIN) 300 MG capsule   Oral   Take 1 capsule (300 mg total) by mouth 3 (three) times daily.   20 capsule   0   . clobetasol cream (TEMOVATE) 0.05 %   Topical   Apply 1 application topically 2 (two) times daily.   30 g   2   . HYDROcodone-acetaminophen (NORCO) 10-325 MG per tablet   Oral   Take 1 tablet by mouth every 8 (eight) hours as needed.   20 tablet   0   . insulin glargine (LANTUS) 100 UNIT/ML injection   Subcutaneous   Inject 0.45 mLs (45 Units total) into the skin daily.   10 mL   12     Dispense as written.    Please dispense vials   . mometasone (ELOCON) 0.1 % cream   Topical   Apply topically 2 (two) times daily.   45 g   2   . NOVOLOG 100 UNIT/ML injection      Sliding scale CBG 70 - 120: 0 units CBG 121 - 150: 1 unit,  CBG 151 - 200: 2 units,  CBG 201 - 250: 3 units,  CBG 251 - 300: 5 units,  CBG 301 - 350: 7 units,  CBG 351 - 400: 9 units  CBG > 400: 10 units   1 vial   12     Dispense as written.    Please dispense as vial. Thanks   . Syringe, Disposable, 1 ML MISC   Does not apply   0.25 mLs by Does not apply route daily.   100 each   0   . triamcinolone cream (KENALOG) 0.1 %   Topical   Apply 1 application topically 2 (two) times daily. Applies to legs and arms         . white petrolatum (VASELINE) GEL   Topical   Apply 1 application topically as needed (to skin on arms, legs, feet, chest, abdomen and back).   106 g   0    SpO2 99% Physical Exam Gen: well developed, thin, chronically and acutely ill appearing.  Head: NCAT Eyes: PERL, EOMI Nose: no epistaixis or rhinorrhea Mouth/throat: mucosa is dehydrated appearing and pink, very poor oral dentition and hygeine,multiple fractured teeth, gingiva inflammed diffusely, there is a 1cm x 1cm fluctuant mass posterior to the #8 tooth. Uvula is midline.  Neck: supple, no adenopathy Lungs: CTA  B, no wheezing, rhonchi or rales, RR 32/min CV: rapid and regular, good peripheral pulses Abd: soft, notender, nondistended Back: no ttp, no cva ttp Skin: psoriatic plaques diffusely Neuro: CN ii-xii grossly intact, no focal motor deficits. deficits, normal speech, normal mental status Psyche;anxious affect, cooperative.   ED Course  Procedures (including critical care time) Labs Review  Results for orders placed during the hospital encounter of 07/02/13 (from the past 24 hour(s))  GLUCOSE, CAPILLARY     Status: Abnormal   Collection Time    07/02/13  6:43 AM      Result Value Range   Glucose-Capillary 521 (*) 70 - 99 mg/dL  CBC WITH DIFFERENTIAL     Status: Abnormal   Collection Time    07/02/13  6:48 AM      Result Value Range   WBC 10.5  4.0 - 10.5 K/uL   RBC 5.26  4.22 - 5.81 MIL/uL   Hemoglobin 18.2 (*) 13.0 - 17.0 g/dL   HCT 16.1  09.6 - 04.5 %   MCV 95.8  78.0 - 100.0 fL   MCH 34.6 (*) 26.0 - 34.0 pg   MCHC 36.1 (*) 30.0 - 36.0 g/dL   RDW 40.9  81.1 - 91.4 %   Platelets 229  150 - 400 K/uL   Neutrophils Relative % 90 (*) 43 - 77 %   Neutro Abs 9.5 (*) 1.7 - 7.7 K/uL   Lymphocytes Relative 4 (*) 12 - 46 %   Lymphs Abs 0.4 (*) 0.7 - 4.0 K/uL   Monocytes Relative 6  3 - 12 %   Monocytes Absolute 0.6  0.1 - 1.0 K/uL   Eosinophils Relative 0  0 - 5 %   Eosinophils Absolute 0.0  0.0 - 0.7 K/uL   Basophils Relative 0  0 - 1 %   Basophils Absolute 0.0  0.0 - 0.1 K/uL  POCT I-STAT 3, BLOOD GAS (G3P V)     Status: Abnormal   Collection Time    07/02/13  7:00 AM      Result Value Range   pH, Ven 7.125 (*) 7.250 - 7.300   pCO2, Ven 26.1 (*) 45.0 - 50.0 mmHg   pO2, Ven 46.0 (*) 30.0 - 45.0 mmHg   Bicarbonate 8.6 (*) 20.0 - 24.0 mEq/L   TCO2 9  0 - 100 mmol/L   O2 Saturation  68.0     Acid-base deficit 19.0 (*) 0.0 - 2.0 mmol/L   Sample type VENOUS     Comment NOTIFIED PHYSICIAN    POCT I-STAT, CHEM 8     Status: Abnormal   Collection Time    07/02/13  7:01 AM       Result Value Range   Sodium 132 (*) 135 - 145 mEq/L   Potassium 4.8  3.5 - 5.1 mEq/L   Chloride 99  96 - 112 mEq/L   BUN 12  6 - 23 mg/dL   Creatinine, Ser 1.47  0.50 - 1.35 mg/dL   Glucose, Bld 829 (*) 70 - 99 mg/dL   Calcium, Ion 5.62  1.30 - 1.23 mmol/L   TCO2 10  0 - 100 mmol/L   Hemoglobin 19.7 (*) 13.0 - 17.0 g/dL   HCT 86.5 (*) 78.4 - 69.6 %   Comment NOTIFIED PHYSICIAN     EKG: sinus tach, no acute ischemic changes, normal QRS, normal axis, normal ST-T segments.   MDM   Patient with periodontal abscess and suspected DKA - based on tachypnea, accucheck of 502 mg/dL, tachycardia, persistent vomiting and overall appearance with history of DKA. We will incise and drain the patient's abscess and treat with clindamycin.   We are resuscitating with crystalloid and initiating tx with glucostabilizer. Treating pain and nausea. Awaiting results of labs. I plan to admit this patient to either the SDU or the ICU - depending on lab findings and response to treatment.   0721: DKA confirmed with bicarb of 10, venous pH of 7.12. The patient is dehydrated and hemoconcentrated. Treatment as above. Case discussed with teaching resident who will see and admit the patient to the SDU.   CRITICAL CARE Performed by: Brandt Loosen   Total critical care time: 16m  Critical care time was exclusive of separately billable procedures and treating other patients.  Critical care was necessary to treat or prevent imminent or life-threatening deterioration.  Critical care was time spent personally by me on the following activities: development of treatment plan with patient and/or surrogate as well as nursing, discussions with consultants, evaluation of patient's response to treatment, examination of patient, obtaining history from patient or surrogate, ordering and performing treatments and interventions, ordering and review of laboratory studies, ordering and review of radiographic studies, pulse oximetry  and re-evaluation of patient's condition.    Brandt Loosen, MD 07/02/13 [phone]

## 2013-07-03 ENCOUNTER — Inpatient Hospital Stay (HOSPITAL_COMMUNITY): Payer: Medicaid Other

## 2013-07-03 LAB — BASIC METABOLIC PANEL
BUN: 4 mg/dL — ABNORMAL LOW (ref 6–23)
BUN: 4 mg/dL — ABNORMAL LOW (ref 6–23)
BUN: 3 mg/dL — ABNORMAL LOW (ref 6–23)
CO2: 16 mEq/L — ABNORMAL LOW (ref 19–32)
Calcium: 8.6 mg/dL (ref 8.4–10.5)
Calcium: 8.7 mg/dL (ref 8.4–10.5)
Calcium: 8.2 mg/dL — ABNORMAL LOW (ref 8.4–10.5)
Chloride: 102 mEq/L (ref 96–112)
Chloride: 100 mEq/L (ref 96–112)
Creatinine, Ser: 0.48 mg/dL — ABNORMAL LOW (ref 0.50–1.35)
GFR calc Af Amer: >90 mL/min (ref >90)
GFR calc Af Amer: >90 mL/min (ref >90)
GFR calc Af Amer: >90 mL/min (ref >90)
GFR calc non Af Amer: >90 mL/min (ref >90)
GFR calc non Af Amer: >90 mL/min (ref >90)
Glucose, Bld: 103 mg/dL — ABNORMAL HIGH (ref 70–99)
Glucose, Bld: 188 mg/dL — ABNORMAL HIGH (ref 70–99)
Glucose, Bld: 127 mg/dL — ABNORMAL HIGH (ref 70–99)
Potassium: 3.5 mEq/L (ref 3.5–5.1)
Potassium: 3.2 mEq/L — ABNORMAL LOW (ref 3.5–5.1)
Sodium: 136 mEq/L (ref 135–145)
Sodium: 136 mEq/L (ref 135–145)
Sodium: 134 mEq/L — ABNORMAL LOW (ref 135–145)

## 2013-07-03 LAB — CBC WITH DIFFERENTIAL/PLATELET
Basophils Absolute: 0 10*3/uL (ref 0.0–0.1)
Basophils Relative: 0 % (ref 0–1)
Eosinophils Absolute: 0.1 10*3/uL (ref 0.0–0.7)
Eosinophils Relative: 3 % (ref 0–5)
HCT: 41 % (ref 39.0–52.0)
MCHC: 36.1 g/dL — ABNORMAL HIGH (ref 30.0–36.0)
MCV: 94.7 fL (ref 78.0–100.0)
Monocytes Absolute: 0.5 10*3/uL (ref 0.1–1.0)
Neutro Abs: 4 10*3/uL (ref 1.7–7.7)
Neutrophils Relative %: 74 % (ref 43–77)
Platelets: 205 10*3/uL (ref 150–400)
RDW: 13 % (ref 11.5–15.5)

## 2013-07-03 LAB — LACTIC ACID, PLASMA: Lactic Acid, Venous: 0.9 mmol/L (ref 0.5–2.2)

## 2013-07-03 LAB — GLUCOSE, CAPILLARY
Glucose-Capillary: 114 mg/dL — ABNORMAL HIGH (ref 70–99)
Glucose-Capillary: 121 mg/dL — ABNORMAL HIGH (ref 70–99)
Glucose-Capillary: 210 mg/dL — ABNORMAL HIGH (ref 70–99)
Glucose-Capillary: 222 mg/dL — ABNORMAL HIGH (ref 70–99)
Glucose-Capillary: 177 mg/dL — ABNORMAL HIGH (ref 70–99)
Glucose-Capillary: 294 mg/dL — ABNORMAL HIGH (ref 70–99)
Glucose-Capillary: 78 mg/dL (ref 70–99)

## 2013-07-03 MED ORDER — POTASSIUM CHLORIDE CRYS ER 20 MEQ PO TBCR
40.0000 meq | EXTENDED_RELEASE_TABLET | Freq: Once | ORAL | Status: AC
  Administered 2013-07-03: 40 meq via ORAL
  Filled 2013-07-03: qty 2

## 2013-07-03 MED ORDER — POTASSIUM CHLORIDE CRYS ER 20 MEQ PO TBCR: 40.0000 meq | EXTENDED_RELEASE_TABLET | Freq: Once | ORAL | Status: DC

## 2013-07-03 MED ORDER — CLINDAMYCIN PHOSPHATE 600 MG/50ML IV SOLN
600.0000 mg | Freq: Three times a day (TID) | INTRAVENOUS | Status: DC
  Administered 2013-07-03 – 2013-07-04 (×4): 600 mg via INTRAVENOUS
  Filled 2013-07-03 (×8): qty 50

## 2013-07-03 MED ORDER — INSULIN GLARGINE 100 UNIT/ML ~~LOC~~ SOLN
30.0000 [IU] | Freq: Every day | SUBCUTANEOUS | Status: DC
  Administered 2013-07-03 – 2013-07-05 (×3): 30 [IU] via SUBCUTANEOUS
  Filled 2013-07-03 (×3): qty 0.3

## 2013-07-03 MED ORDER — CLINDAMYCIN PHOSPHATE 600 MG/50ML IV SOLN: 600.0000 mg | Freq: Three times a day (TID) | INTRAVENOUS | Status: DC

## 2013-07-03 MED ORDER — INSULIN ASPART 100 UNIT/ML ~~LOC~~ SOLN
0.0000 [IU] | Freq: Three times a day (TID) | SUBCUTANEOUS | Status: DC
  Administered 2013-07-03: 5 [IU] via SUBCUTANEOUS
  Administered 2013-07-03: 2 [IU] via SUBCUTANEOUS
  Administered 2013-07-04: 1 [IU] via SUBCUTANEOUS
  Administered 2013-07-04 (×2): 3 [IU] via SUBCUTANEOUS
  Administered 2013-07-05: 2 [IU] via SUBCUTANEOUS
  Administered 2013-07-05: 3 [IU] via SUBCUTANEOUS

## 2013-07-03 MED ORDER — SODIUM CHLORIDE 0.9 % IV SOLN
INTRAVENOUS | Status: DC
  Administered 2013-07-03: 75 mL/h via INTRAVENOUS
  Administered 2013-07-04: via INTRAVENOUS

## 2013-07-03 NOTE — Progress Notes (Signed)
  Recent Labs Lab 07/02/13 1915 07/02/13 2054 07/03/13 0115 07/03/13 0419 07/03/13 1620  K 4.0 3.8 3.3* 3.5 3.2*    Recent Labs Lab 07/02/13 1915 07/02/13 2054 07/03/13 0115 07/03/13 0419 07/03/13 1620  CREATININE 0.52 0.56 0.53 0.50 0.47*    A Low K  Plan KCl po Check mag and phs  Dr. Kalman Shan, M.D., [facility].C.P Pulmonary and Critical Care Medicine Staff Physician [facility] [place] [facility] Pager: [phone], If no answer or between  15:00h - 7:00h: call [phone]  07/03/2013 6:25 PM

## 2013-07-03 NOTE — Progress Notes (Signed)
I agree with progress note by Student Doctor McDaniel.  Please see my note as well. 

## 2013-07-03 NOTE — Progress Notes (Signed)
Subjective: 37 year old male with recently diagnosed DM type 1 and psoriasis presenting with two day history of N/V and decreased po admitted for DKA likely associated with dental abscess. Jared Murray reports that the abscess on the roof of his mouth spontaneous drained with immediate resolution of his pain. He reports that this morning the area had recollected and was tender again. He was able to eat a small breakfast of only cereal and denies nausea following. He has had adequate urine output and reports to have had one BM this morning that he describes as loose. He denies constipation, N/V, fevers, chills or pain otherwise and reports to be feeling better today subjectively.   Objective: Vital signs in last 24 hours: Filed Vitals:   07/02/13 1955 07/02/13 2345 07/03/13 0351 07/03/13 0745  BP: 125/71 122/75 126/79 109/76  Pulse: 90 86 91   Temp: 98.7 F (37.1 C) 97.8 F (36.6 C) 97.9 F (36.6 C) 98.6 F (37 C)  TempSrc: Oral Axillary Oral Oral  Resp: 12 11 19    Height:      Weight:      SpO2: 100% 100% 100%    Weight change:   Intake/Output Summary (Last 24 hours) at 07/03/13 1154 Last data filed at 07/03/13 0743  Gross per 24 hour  Intake 1733.75 ml  Output    850 ml  Net 883.75 ml   Physical Exam: General: resting in bed in no acute distress  HEENT: poor dentition, abscess noted anterior maxilla, collection on palate that is not spontaneously draining but productive of purulence following palpitations.   Cardiac: Regular rate and rhythm, no rubs, murmurs or gallops  Pulm: clear to auscultation bilaterally, - Kussmaul's respirations this AM.  Abd: soft, nontender, nondistended, BS hyperactive.  Ext: warm and well perfused, no pedal edema, scaly plaque psoriasis on extremities especially severe on feet that are unchanged from yesterday.  Neuro: alert and oriented X3, mentating appropriately, able to speak and follow commands  Lab Results: CBC    Component Value Date/Time     WBC 5.5 07/03/2013 0413   RBC 4.33 07/03/2013 0413   HGB 14.8 07/03/2013 0413   HCT 41.0 07/03/2013 0413   PLT 205 07/03/2013 0413   MCV 94.7 07/03/2013 0413   MCH 34.2* 07/03/2013 0413   MCHC 36.1* 07/03/2013 0413   RDW 13.0 07/03/2013 0413   LYMPHSABS 0.7 07/03/2013 0413   MONOABS 0.5 07/03/2013 0413   EOSABS 0.1 07/03/2013 0413   BASOSABS 0.0 07/03/2013 0413   CMP     Component Value Date/Time   NA 136 07/03/2013 0419   K 3.5 07/03/2013 0419   CL 102 07/03/2013 0419   CO2 16* 07/03/2013 0419   GLUCOSE 188* 07/03/2013 0419   BUN 4* 07/03/2013 0419   CREATININE 0.50 07/03/2013 0419   CALCIUM 8.6 07/03/2013 0419   PROT 7.1 07/02/2013 1215   ALBUMIN 3.3* 07/02/2013 1215   AST 13 07/02/2013 1215   ALT 10 07/02/2013 1215   ALKPHOS 103 07/02/2013 1215   BILITOT 0.4 07/02/2013 1215   GFRNONAA >90 07/03/2013 0419   GFRAA >90 07/03/2013 0419   CBG (last 3)   Recent Labs  07/03/13 0453 07/03/13 0703 07/03/13 0808  GLUCAP 179* 222* 177*    Micro Results: Recent Results (from the past 240 hour(s))  MRSA PCR SCREENING     Status: None   Collection Time    07/02/13  1:45 PM      Result Value Range Status   MRSA  by PCR NEGATIVE  NEGATIVE Final   Comment:            The GeneXpert MRSA Assay (FDA     approved for NASAL specimens     only), is one component of a     comprehensive MRSA colonization     surveillance program. It is not     intended to diagnose MRSA     infection nor to guide or     monitor treatment for     MRSA infections.   Studies/Results: Dg Orthopantogram  07/03/2013   CLINICAL DATA:  Anterior maxillary abscess  EXAM: ORTHOPANTOGRAM/PANORAMIC  COMPARISON:  None.  FINDINGS: Extensive dental caries identified. The patient is partly edentulous. Lucency about the apices of multiple teeth could signify dental abscess. These findings would be better evaluated at dedicated dental imaging. Mandibular condyles and rami are normal.  IMPRESSION: Extensive  dental caries.  Dedicated dental imaging is recommended.   Electronically Signed   By: Christiana Pellant M.D.   On: 07/03/2013 09:05   Dg Chest Port 1v Same Day  07/02/2013   CLINICAL DATA:  Shortness of breath, diabetic ketoacidosis, vomiting  EXAM: PORTABLE CHEST - 1 VIEW SAME DAY  COMPARISON:  03/16/2013  FINDINGS: The heart size and mediastinal contours are within normal limits. Both lungs are clear. The visualized skeletal structures are unremarkable.  IMPRESSION: No active disease.   Electronically Signed   By: Ruel Favors M.D.   On: 07/02/2013 11:30   Medications: I have reviewed the patient's current medications. Scheduled Meds:  clindamycin (CLEOCIN) IV  600 mg Intravenous Q8H   heparin  5,000 Units Subcutaneous Q8H   insulin aspart  0-9 Units Subcutaneous TID WC   insulin glargine  30 Units Subcutaneous Daily   sodium chloride  3 mL Intravenous Q12H   Continuous Infusions:  PRN Meds:.dextrose, morphine injection, ondansetron (ZOFRAN) IV, ondansetron (ZOFRAN) IV, ondansetron  Assessment/Plan: Principal Problem:   DKA (diabetic ketoacidoses) Active Problems:   Psoriasis  Jared Murray is a 37 year old male with recently diagnosed DM type 1 and psoriasis presenting with two day history of N/V and decreased po admitted for DKA likely associated with dental abscess.  #DKA - Recent blood glucoses have improved since admission ranging from 170 to 220. Metabolic acidosis with pH of 7.095, bicarb 4.3 appears to be resolving, however his CMP at 4 am indicated a gap of 18. Etiology likely dental abscess. ST changes on EKG but no CP or significant cardiac risk factors and poc Trop negative making MI less likely. No URI symptoms and CXR negative for PNA. UA negative for UTI.  - admit to SDU  - Recent BMET pending, F/U to evaluate for anion gap.  - NSS 53mL/hr  - D5 - 1/2NSS 66mL/hr  - SSI  - BMET q8hr, monitor AG and K  - Zofran prn, morphine prn   #Dental abscess - the patient  reports having previously planned to get all of his teeth removed. He was seen yesterday by his PCP and referred to dentist. He reports the abscess has been causing him increased pain for the past few days. Overnight the abscess spontaneously drained with resolution of pain, but re-epithelialized this AM with returned pain. Upon palpation purulence was expressed.  - Plan to consult OMFS of Monday 07/04/13. - Continue clindamycin IV.   #Psoriasis - The patient reports a long history of what he was told was eczema. However, he says he was recently diagnosed with psoriasis. He has  extremely scaly plaques on the extremities, particularly on his ankles and feet. He has been referred to Dermatology by his PCP.  - Plan for outpatient Derm follow-up   #DVT ppx - [place] heparin   Dispo: Disposition is deferred at this time, awaiting improvement of current medical problems. Anticipated discharge in approximately 2-3 day(s).   This is a Psychologist, occupational Note.  The care of the patient was discussed with Dr. Andrey Campanile and the assessment and plan formulated with their assistance.  Please see their attached note for official documentation of the daily encounter.   LOS: 1 day   Lalla Brothers MS3 07/03/2013, 11:54 AM

## 2013-07-03 NOTE — Progress Notes (Addendum)
Subjective: Jared Murray was seen and examined this morning.  He reports feeling better today.  He denies N/V, abdominal pain.  He tolerated breakfast.  He had 1 episode of diarrhea this morning.   Objective: Vital signs in last 24 hours: Filed Vitals:   07/02/13 1955 07/02/13 2345 07/03/13 0351 07/03/13 0745  BP: 125/71 122/75 126/79 109/76  Pulse: 90 86 91   Temp: 98.7 F (37.1 C) 97.8 F (36.6 C) 97.9 F (36.6 C) 98.6 F (37 C)  TempSrc: Oral Axillary Oral Oral  Resp: 12 11 19    Height:      Weight:      SpO2: 100% 100% 100%    Weight change:   Intake/Output Summary (Last 24 hours) at 07/03/13 0903 Last data filed at 07/03/13 0600  Gross per 24 hour  Intake 1683.75 ml  Output    850 ml  Net 833.75 ml   General: resting in bed in NAD HEENT: PERRL, EOMI, no scleral icterus Cardiac: RRR, no rubs, murmurs or gallops Pulm: clear to auscultation bilaterally, moving normal volumes of air Abd: soft, nontender, nondistended, BS present Ext: warm and well perfused, no pedal edema Skin:  Plaque psoriasis on extremities, severe on ankles and feet Neuro: alert and oriented X3  Lab Results: Basic Metabolic Panel:  Recent Labs Lab 07/03/13 0115 07/03/13 0419  NA 136 136  K 3.3* 3.5  CL 105 102  CO2 20 16*  GLUCOSE 103* 188*  BUN 4* 4*  CREATININE 0.53 0.50  CALCIUM 8.6 8.6   Liver Function Tests:  Recent Labs Lab 07/02/13 1215  AST 13  ALT 10  ALKPHOS 103  BILITOT 0.4  PROT 7.1  ALBUMIN 3.3*   CBC:  Recent Labs Lab 07/02/13 0648 07/02/13 0701 07/03/13 0413  WBC 10.5  --  5.5  NEUTROABS 9.5*  --  4.0  HGB 18.2* 19.7* 14.8  HCT 50.4 58.0* 41.0  MCV 95.8  --  94.7  PLT 229  --  205   CBG:  Recent Labs Lab 07/03/13 0247 07/03/13 0350 07/03/13 0440 07/03/13 0453 07/03/13 0703 07/03/13 0808  GLUCAP 121* 155* 210* 179* 222* 177*   Thyroid Function Tests:  Recent Labs Lab 07/01/13 0943  TSH 2.095   Urine Drug Screen: Drugs of Abuse     Component Value Date/Time   LABOPIA POSITIVE* 07/02/2013 0721   COCAINSCRNUR NONE DETECTED 07/02/2013 0721   LABBENZ NONE DETECTED 07/02/2013 0721   AMPHETMU NONE DETECTED 07/02/2013 0721   THCU NONE DETECTED 07/02/2013 0721   LABBARB NONE DETECTED 07/02/2013 0721    Urinalysis:  Recent Labs Lab 07/02/13 0721  COLORURINE YELLOW  LABSPEC 1.026  PHURINE 5.0  GLUCOSEU >1000*  HGBUR TRACE*  BILIRUBINUR NEGATIVE  KETONESUR >80*  PROTEINUR 30*  UROBILINOGEN 0.2  NITRITE NEGATIVE  LEUKOCYTESUR NEGATIVE   Micro Results: Recent Results (from the past 240 hour(s))  MRSA PCR SCREENING     Status: None   Collection Time    07/02/13  1:45 PM      Result Value Range Status   MRSA by PCR NEGATIVE  NEGATIVE Final   Comment:            The GeneXpert MRSA Assay (FDA     approved for NASAL specimens     only), is one component of a     comprehensive MRSA colonization     surveillance program. It is not     intended to diagnose MRSA     infection nor  to guide or     monitor treatment for     MRSA infections.   Studies/Results: Dg Chest Port 1v Same Day  07/02/2013   CLINICAL DATA:  Shortness of breath, diabetic ketoacidosis, vomiting  EXAM: PORTABLE CHEST - 1 VIEW SAME DAY  COMPARISON:  03/16/2013  FINDINGS: The heart size and mediastinal contours are within normal limits. Both lungs are clear. The visualized skeletal structures are unremarkable.  IMPRESSION: No active disease.   Electronically Signed   By: Ruel Favors M.D.   On: 07/02/2013 11:30   Medications: I have reviewed the patient's current medications. Scheduled Meds: . clindamycin (CLEOCIN) IV  600 mg Intravenous Q8H  . heparin  5,000 Units Subcutaneous Q8H  . insulin aspart  0-9 Units Subcutaneous TID WC  . insulin glargine  30 Units Subcutaneous Daily  . sodium chloride  3 mL Intravenous Q12H   Continuous Infusions: . dextrose 5 % and 0.45% NaCl 100 mL (07/02/13 2015)  . insulin (NOVOLIN-R) infusion 3.2 Units/hr  (07/03/13 0703)   PRN Meds:.dextrose, morphine injection, ondansetron (ZOFRAN) IV, ondansetron (ZOFRAN) IV, ondansetron  Assessment/Plan: 37 year old male with recently diagnosed DM type 1 and psoriasis presenting with two day history of N/V and decreased po.   #DKA - poc BG 460 in the ED. Metabolic acidosis with pH of 7.095, bicarb 4.3. Etiology likely dental abscess. ST changes on EKG but no CP or significant cardiac risk factors and poc Trop negative making MI less likely. No URI symptoms and CXR negative for PNA. UA negative for UTI.  AG improved on insulin gtt.  Lantus resumed this morning and patient now off gtt.  AG closed to 11, then open to 18 this AM.  Patient says he is tolerating po.  Says he had cereal for breakfast, not sure if he is eating that much. - NSS 67mL/hr - BMET q8hr, monitor AG and K  - Zofran prn, morphine prn   #Dental abscess - the patient reports having previously planned to get all of his teeth removed. He was seen yesterday by his PCP and referred to dentist. He reports the abscess has been causing him increased pain for the past few days.  Dental xray concerning for abscess. - I&D; will contact oral-maxillofacial surgery tomorrow  - continue clindamycin   #Psoriasis - The patient reports a long history of what he was told was eczema. However, he says he was recently diagnosed with psoriasis. He has extremely scaly plaques on the extremities, particularly on his ankles and feet. He has been referred to Dermatology by his PCP.  - outpatient Derm follow-up   #DVT ppx - [place] heparin   Dispo: Disposition is deferred at this time, awaiting improvement of current medical problems.  Anticipated discharge in approximately 1-2 day(s).   The patient does have a current PCP (Jeanann Lewandowsky, MD) and does need an [facility] hospital follow-up appointment after discharge.  The patient does not know have transportation limitations that hinder transportation to clinic  appointments.  .Services Needed at time of discharge: Y = Yes, Blank = No PT:   OT:   RN:   Equipment:   Other:     LOS: 1 day   Evelena Peat, DO 07/03/2013, 9:03 AM

## 2013-07-03 NOTE — H&P (Signed)
  Date: 07/03/2013  Patient name: Jared Murray  Medical record number: [id number]  Date of birth: 23-Sep-1975   I have seen and evaluated Jared Murray and discussed their care with the Residency Team.  Jared Murray presented with N/V, decreased PO intake, lack of insulin for 24 hours and tooth pain/swelling.  He was found to be DKA and with a tooth abscess.  Likely DKA was triggered by infection and lack of insulin for 24 hours  Assessment and Plan: I have seen and evaluated the patient as outlined above. I agree with the formulated Assessment and Plan as detailed in the residents' admission note, with the following changes:   1. DKA, AG > 30 - He was admitted to SDU and placed on insulin drip with frequent BMETs to evaluate for K and AG - IVF for volume repletion - Zofran for nausea - Once Gap closes, can transition to SQ insulin - Consider DM education prior to discharge  2. Dental abscess - Panorex pending - Will need consultation with dentistry or [facility] for dental I&D, likely Monday - Continue clindamycin for now  3. Pseudohyponatremia - 133, corrected to 142 when glucose taken in to account.   Other issues per resident note  Jared Catalina, MD 11/16/201410:12 AM

## 2013-07-04 LAB — BASIC METABOLIC PANEL
BUN: 3 mg/dL — ABNORMAL LOW (ref 6–23)
BUN: <3 mg/dL — ABNORMAL LOW (ref 6–23)
BUN: 4 mg/dL — ABNORMAL LOW (ref 6–23)
CO2: 28 mEq/L (ref 19–32)
Chloride: 99 mEq/L (ref 96–112)
Chloride: 98 mEq/L (ref 96–112)
Creatinine, Ser: 0.48 mg/dL — ABNORMAL LOW (ref 0.50–1.35)
Creatinine, Ser: 0.5 mg/dL (ref 0.50–1.35)
GFR calc Af Amer: >90 mL/min (ref >90)
GFR calc non Af Amer: >90 mL/min (ref >90)
Glucose, Bld: 277 mg/dL — ABNORMAL HIGH (ref 70–99)
Glucose, Bld: 170 mg/dL — ABNORMAL HIGH (ref 70–99)
Potassium: 3.4 mEq/L — ABNORMAL LOW (ref 3.5–5.1)
Potassium: 4 mEq/L (ref 3.5–5.1)
Potassium: 3.6 mEq/L (ref 3.5–5.1)
Sodium: 134 mEq/L — ABNORMAL LOW (ref 135–145)
Sodium: 134 mEq/L — ABNORMAL LOW (ref 135–145)

## 2013-07-04 LAB — CBC WITH DIFFERENTIAL/PLATELET
Basophils Absolute: 0 10*3/uL (ref 0.0–0.1)
Basophils Relative: 0 % (ref 0–1)
Eosinophils Relative: 12 % — ABNORMAL HIGH (ref 0–5)
HCT: 37 % — ABNORMAL LOW (ref 39.0–52.0)
Lymphocytes Relative: 22 % (ref 12–46)
Lymphs Abs: 0.7 10*3/uL (ref 0.7–4.0)
Monocytes Absolute: 0.4 10*3/uL (ref 0.1–1.0)
Neutro Abs: 1.7 10*3/uL (ref 1.7–7.7)
Neutrophils Relative %: 52 % (ref 43–77)
Platelets: 191 10*3/uL (ref 150–400)
RDW: 12.8 % (ref 11.5–15.5)
WBC: 3.2 10*3/uL — ABNORMAL LOW (ref 4.0–10.5)

## 2013-07-04 LAB — CBC
HCT: 38.8 % — ABNORMAL LOW (ref 39.0–52.0)
Hemoglobin: 14.1 g/dL (ref 13.0–17.0)
MCH: 33.8 pg (ref 26.0–34.0)
MCHC: 36.3 g/dL — ABNORMAL HIGH (ref 30.0–36.0)
MCV: 93 fL (ref 78.0–100.0)
Platelets: 200 10*3/uL (ref 150–400)
RDW: 13.1 % (ref 11.5–15.5)

## 2013-07-04 LAB — GLUCOSE, CAPILLARY
Glucose-Capillary: 244 mg/dL — ABNORMAL HIGH (ref 70–99)
Glucose-Capillary: 227 mg/dL — ABNORMAL HIGH (ref 70–99)
Glucose-Capillary: 173 mg/dL — ABNORMAL HIGH (ref 70–99)

## 2013-07-04 MED ORDER — MAGNESIUM SULFATE 40 MG/ML IJ SOLN
2.0000 g | Freq: Once | INTRAMUSCULAR | Status: AC
  Administered 2013-07-04: 2 g via INTRAVENOUS
  Filled 2013-07-04: qty 50

## 2013-07-04 MED ORDER — CLINDAMYCIN HCL 300 MG PO CAPS
300.0000 mg | ORAL_CAPSULE | Freq: Four times a day (QID) | ORAL | Status: DC
  Administered 2013-07-04 – 2013-07-05 (×3): 300 mg via ORAL
  Filled 2013-07-04 (×6): qty 1

## 2013-07-04 MED ORDER — GLUCERNA SHAKE PO LIQD
237.0000 mL | Freq: Three times a day (TID) | ORAL | Status: DC
  Administered 2013-07-04 – 2013-07-05 (×4): 237 mL via ORAL

## 2013-07-04 MED ORDER — POTASSIUM CHLORIDE CRYS ER 20 MEQ PO TBCR
40.0000 meq | EXTENDED_RELEASE_TABLET | Freq: Once | ORAL | Status: AC
  Administered 2013-07-04: 40 meq via ORAL
  Filled 2013-07-04: qty 2

## 2013-07-04 NOTE — Progress Notes (Signed)
Subjective: 37 year old male with recently diagnosed DM type 1 and psoriasis presenting with two day history of N/V and decreased po intake admitted for DKA likely associated with dental abscess. Overnight Jared Murray received of KDur and 2mg  of Mg following lab results of K-3.4 and Mg- 1.3.  Jared Murray reports that the abscess on the roof of his mouth has continued to drain and he has not experienced symptoms since yesterday morning. He reports to have a mild bad taste in his mouth that he reports is accompanied by thick saliva. He continues to have a reduced appetite, but he was able to have a small dinner last evening and denies nausea following. He has had adequate urine output and reports to have had two BM yesterday. He denies constipation, N/V, fevers, chills or pain otherwise and reports the is continuing to improve subjectively.   Objective: Vital signs in last 24 hours: Filed Vitals:   07/03/13 1900 07/03/13 2300 07/04/13 0400 07/04/13 0800  BP: 115/74 115/73 110/67 115/87  Pulse: 110 107 96   Temp: 98.4 F (36.9 C) 99 F (37.2 C) 98.4 F (36.9 C) 98.2 F (36.8 C)  TempSrc: Oral Oral Oral Oral  Resp:  11 17   Height:      Weight:      SpO2: 100% 100% 100%    Weight change:   Intake/Output Summary (Last 24 hours) at 07/04/13 1131 Last data filed at 07/04/13 0500  Gross per 24 hour  Intake    240 ml  Output    300 ml  Net    -60 ml   Physical Exam: General: Resting in bed in no acute distress  HEENT: Generalized poor dentition, abscess noted in the anterior maxilla,  Cardiac: Regular rate and rhythm, no rubs, murmurs or gallops  Pulm: clear to auscultation bilaterally, (-)Kussmaul's respirations this AM.  Abd: soft, nontender, nondistended, BS hyperactive.  Ext: warm and well perfused, no pedal edema, scaly plaque psoriasis on extremities especially severe on feet that are unchanged from yesterday.  Neuro: alert and oriented X3, mentating appropriately, able to  speak and follow commands  Lab Results: CBC    Component Value Date/Time   WBC 3.2* 07/04/2013 0058   RBC 3.98* 07/04/2013 0058   HGB 13.7 07/04/2013 0058   HCT 37.0* 07/04/2013 0058   PLT 191 07/04/2013 0058   MCV 93.0 07/04/2013 0058   MCH 34.4* 07/04/2013 0058   MCHC 37.0* 07/04/2013 0058   RDW 12.8 07/04/2013 0058   LYMPHSABS 0.7 07/04/2013 0058   MONOABS 0.4 07/04/2013 0058   EOSABS 0.4 07/04/2013 0058   BASOSABS 0.0 07/04/2013 0058   CMP     Component Value Date/Time   NA 134* 07/04/2013 0058   K 3.4* 07/04/2013 0058   CL 101 07/04/2013 0058   CO2 24 07/04/2013 0058   GLUCOSE 96 07/04/2013 0058   BUN 3* 07/04/2013 0058   CREATININE 0.48* 07/04/2013 0058   CALCIUM 8.4 07/04/2013 0058   PROT 7.1 07/02/2013 1215   ALBUMIN 3.3* 07/02/2013 1215   AST 13 07/02/2013 1215   ALT 10 07/02/2013 1215   ALKPHOS 103 07/02/2013 1215   BILITOT 0.4 07/02/2013 1215   GFRNONAA >90 07/04/2013 0058   GFRAA >90 07/04/2013 0058   CBG (last 3)   Recent Labs  07/03/13 1648 07/03/13 2154 07/04/13 0755  GLUCAP 78 136* 144*    Micro Results: Recent Results (from the past 240 hour(s))  MRSA PCR SCREENING  Status: None   Collection Time    07/02/13  1:45 PM      Result Value Range Status   MRSA by PCR NEGATIVE  NEGATIVE Final   Comment:            The GeneXpert MRSA Assay (FDA     approved for NASAL specimens     only), is one component of a     comprehensive MRSA colonization     surveillance program. It is not     intended to diagnose MRSA     infection nor to guide or     monitor treatment for     MRSA infections.   Studies/Results: Dg Orthopantogram  07/03/2013   CLINICAL DATA:  Anterior maxillary abscess  EXAM: ORTHOPANTOGRAM/PANORAMIC  COMPARISON:  None.  FINDINGS: Extensive dental caries identified. The patient is partly edentulous. Lucency about the apices of multiple teeth could signify dental abscess. These findings would be better evaluated at dedicated  dental imaging. Mandibular condyles and rami are normal.  IMPRESSION: Extensive dental caries.  Dedicated dental imaging is recommended.   Electronically Signed   By: Christiana Pellant M.D.   On: 07/03/2013 09:05   Medications: I have reviewed the patient's current medications. Scheduled Meds:  clindamycin (CLEOCIN) IV  600 mg Intravenous Q8H   heparin  5,000 Units Subcutaneous Q8H   insulin aspart  0-9 Units Subcutaneous TID WC   insulin glargine  30 Units Subcutaneous Daily   sodium chloride  3 mL Intravenous Q12H   Continuous Infusions:  sodium chloride 75 mL/hr at 07/04/13 0600   PRN Meds:.dextrose, morphine injection, ondansetron (ZOFRAN) IV, ondansetron (ZOFRAN) IV, ondansetron  Assessment/Plan: Principal Problem:   DKA (diabetic ketoacidoses) Active Problems:   Psoriasis  Jared Murray is a 37 year old male with recently diagnosed DM type 1 and psoriasis presenting with two day history of N/V and decreased po admitted on 11/15 for DKA likely associated with dental abscess.  #DKA - Recent blood glucoses have improved since admission ranging from 136-294. Metabolic acidosis with pH of 7.095, bicarb 4.3 appears to be resolving, At midnight his gap had closed (11). Etiology likely dental abscess. ST changes on EKG but no CP or significant cardiac risk factors and poc Trop negative making MI less likely. No URI symptoms and CXR negative for PNA. UA negative for UTI.  - Morning BMET pending, F/U to evaluate for anion gap.  - Home Lantus resumed yesterday.  - NSS 50mL/hr  - D5 - 1/2NSS 6mL/hr prn - SSI  - BMET q8hr, monitor AG and K  - Zofran prn, morphine prn   #Dental abscess - The patient reports having previously planned to get all of his teeth removed. Abscess is asymptomatic today and continuous to drain. Jared Murray was prescribed a 7 day course of Clindamycin prior to admission that he had not started.   - OMFS Consulted. F/U recommendations - Continue clindamycin IV.    #Psoriasis - The patient reports a long history of what he was told was eczema. However, he says he was recently diagnosed with psoriasis. He has extremely scaly plaques on the extremities, particularly on his ankles and feet. He has been referred to Dermatology by his PCP.  - Plan for outpatient Derm follow-up   #DVT ppx - [place] heparin   Dispo: Disposition is deferred at this time, awaiting improvement of current medical problems. Anticipated discharge in approximately 2-3 day(s).   This is a Psychologist, occupational Note.  The care of the  patient was discussed with Dr. Andrey Campanile and the assessment and plan formulated with their assistance.  Please see their attached note for official documentation of the daily encounter.   LOS: 2 days   Lalla Brothers MS3 07/04/2013, 11:31 AM

## 2013-07-04 NOTE — Progress Notes (Signed)
Patient being transferred to 6N bed 10 via wheelchair. Phone report called to [place], Charity fundraiser. Patient is aware of the transfer.

## 2013-07-04 NOTE — Progress Notes (Signed)
I agree with progress note by Student Doctor Julien Girt.  Please see my note as well.

## 2013-07-04 NOTE — Progress Notes (Signed)
INITIAL NUTRITION ASSESSMENT  DOCUMENTATION CODES Per approved criteria  -Severe malnutrition in the context of acute illness or injury   INTERVENTION:  Glucerna Shake PO TID, each supplement provides 220 kcal and 10 grams of protein.  Diabetes diet education provided.  NUTRITION DIAGNOSIS: Increased nutrient needs related to depletion of nutrition stores as evidenced by estimated nutrition needs.   Goal: Intake to meet >90% of estimated nutrition needs.  Monitor:  PO intake, labs, weight trend.  Reason for Assessment: MST=3  37 y.o. male  Admitting Dx: DKA (diabetic ketoacidoses)  ASSESSMENT: Patient admitted on 11/15 with DKA related to tooth abscess. Patient reports that he was vomiting at home and developed a poor appetite with poor intake ~1 week ago. Patient familiar from previous admission in July. He has been eating well at home up until ~1 week ago, when he became sick. His weight is up ~15 lb since July, but he is still 13% below his usual weight of 150 lb. Patient requests diabetes diet information for his wife to review. He usually eats well, but his wife prepares foods that he thinks he should not be eating. Handout from the Academy of [employer] provided to patient and reviewed. Diabetes Treatment Team has been following patient and OP diabetes diet education has been ordered. Noted that patient has not been to OP diet education since last admission.  Nutrition Focused Physical Exam:  Subcutaneous Fat:  Orbital Region: WNL Upper Arm Region: WNL Thoracic and Lumbar Region: WNL  Muscle:  Temple Region: mild-moderate depletion Clavicle Bone Region: mild-moderate depletion Clavicle and Acromion Bone Region: mild-moderate depletion Scapular Bone Region: WNL Dorsal Hand: WNL Patellar Region: WNL Anterior Thigh Region: WNL Posterior Calf Region: WNL  Edema: none  Pt meets criteria for severe MALNUTRITION in the context of acute illness as evidenced  by moderate depletion of muscle mass and intake </= 50% of estimated energy requirement for >/= 5 days.   Height: Ht Readings from Last 1 Encounters:  07/02/13 5\' 10"  (1.778 m)    Weight: Wt Readings from Last 1 Encounters:  07/02/13 130 lb (58.968 kg)    Ideal Body Weight: 75.5 kg  % Ideal Body Weight: 78%  Wt Readings from Last 10 Encounters:  07/02/13 130 lb (58.968 kg)  07/01/13 131 lb (59.421 kg)  03/16/13 116 lb 4 oz (52.731 kg)    Usual Body Weight: 150 lb per patient  % Usual Body Weight: 87%  BMI:  Body mass index is 18.65 kg/(m^2).  Estimated Nutritional Needs: Kcal: 2000-2200 Protein: 100-115 gm Fluid: 2-2.2 L  Skin: no wounds  Diet Order: Carb Control  EDUCATION NEEDS: -Education needs addressed-at patient's request, provided "Carbohydrate Counting for People with Diabetes" handout from the Academy of Nutrition [employer]etics.   Intake/Output Summary (Last 24 hours) at 07/04/13 1419 Last data filed at 07/04/13 0500  Gross per 24 hour  Intake    240 ml  Output    300 ml  Net    -60 ml    Last BM: 11/16   Labs:   Recent Labs Lab 07/03/13 2305 07/04/13 0058 07/04/13 1115  NA 132* 134* 134*  K 3.2* 3.4* 4.0  CL 100 101 99  CO2 24 24 28   BUN 3* 3* <3*  CREATININE 0.48* 0.48* 0.52  CALCIUM 8.2* 8.4 8.8  MG  --  1.3*  --   PHOS  --  1.6*  --   GLUCOSE 191* 96 277*    CBG (last 3)  Recent Labs  07/03/13 2154 07/04/13 0755 07/04/13 1212  GLUCAP 136* 144* 244*    Scheduled Meds: . clindamycin (CLEOCIN) IV  600 mg Intravenous Q8H  . heparin  5,000 Units Subcutaneous Q8H  . insulin aspart  0-9 Units Subcutaneous TID WC  . insulin glargine  30 Units Subcutaneous Daily  . sodium chloride  3 mL Intravenous Q12H    Continuous Infusions: . sodium chloride 75 mL/hr at 07/04/13 1100    Past Medical History  Diagnosis Date  . Eczema   . Type 1 diabetes mellitus     Past Surgical History  Procedure Laterality Date  . Finger  surgery      Joaquin Courts, RD, LDN, CNSC Pager [phone] After Hours Pager [phone]

## 2013-07-04 NOTE — Progress Notes (Signed)
Subjective: [name] was seen and examined this morning.  He was eating breakfast and reported feeling better today.  He denied N/V, abdominal pain.  He has not had any diarrhea today.    Objective: Vital signs in last 24 hours: Filed Vitals:   07/04/13 0800 07/04/13 0900 07/04/13 1100 07/04/13 1235  BP: 115/87   113/92  Pulse:    86  Temp: 98.2 F (36.8 C)   98.5 F (36.9 C)  TempSrc: Oral   Oral  Resp:  16 9 18   Height:      Weight:      SpO2:    100%   Weight change:   Intake/Output Summary (Last 24 hours) at 07/04/13 1440 Last data filed at 07/04/13 0500  Gross per 24 hour  Intake    240 ml  Output    300 ml  Net    -60 ml   General: resting in bed in NAD Cardiac: RRR, no rubs, murmurs or gallops Pulm: clear to auscultation bilaterally, moving normal volumes of air Abd: soft, nontender, nondistended, BS present Ext: warm and well perfused, no pedal edema Skin:  Plaque psoriasis on extremities, severe on ankles and feet Neuro: alert and oriented X3  Lab Results: Basic Metabolic Panel:  Recent Labs Lab 07/03/13 2305 07/04/13 0058 07/04/13 1115  NA 132* 134* 134*  K 3.2* 3.4* 4.0  CL 100 101 99  CO2 24 24 28   GLUCOSE 191* 96 277*  BUN 3* 3* <3*  CREATININE 0.48* 0.48* 0.52  CALCIUM 8.2* 8.4 8.8  MG  --  1.3*  --   PHOS  --  1.6*  --    Liver Function Tests:  Recent Labs Lab 07/02/13 1215  AST 13  ALT 10  ALKPHOS 103  BILITOT 0.4  PROT 7.1  ALBUMIN 3.3*   CBC:  Recent Labs Lab 07/03/13 0413 07/04/13 0058 07/04/13 1115  WBC 5.5 3.2* 1.6*  NEUTROABS 4.0 1.7  --   HGB 14.8 13.7 14.1  HCT 41.0 37.0* 38.8*  MCV 94.7 93.0 93.0  PLT 205 191 200   CBG:  Recent Labs Lab 07/03/13 0808 07/03/13 1151 07/03/13 1648 07/03/13 2154 07/04/13 0755 07/04/13 1212  GLUCAP 177* 294* 78 136* 144* 244*   Thyroid Function Tests:  Recent Labs Lab 07/01/13 0943  TSH 2.095   Urine Drug Screen: Drugs of Abuse     Component Value  Date/Time   LABOPIA POSITIVE* 07/02/2013 0721   COCAINSCRNUR NONE DETECTED 07/02/2013 0721   LABBENZ NONE DETECTED 07/02/2013 0721   AMPHETMU NONE DETECTED 07/02/2013 0721   THCU NONE DETECTED 07/02/2013 0721   LABBARB NONE DETECTED 07/02/2013 0721    Urinalysis:  Recent Labs Lab 07/02/13 0721  COLORURINE YELLOW  LABSPEC 1.026  PHURINE 5.0  GLUCOSEU >1000*  HGBUR TRACE*  BILIRUBINUR NEGATIVE  KETONESUR >80*  PROTEINUR 30*  UROBILINOGEN 0.2  NITRITE NEGATIVE  LEUKOCYTESUR NEGATIVE   Micro Results: Recent Results (from the past 240 hour(s))  MRSA PCR SCREENING     Status: None   Collection Time    07/02/13  1:45 PM      Result Value Range Status   MRSA by PCR NEGATIVE  NEGATIVE Final   Comment:            The GeneXpert MRSA Assay (FDA     approved for NASAL specimens     only), is one component of a     comprehensive MRSA colonization     surveillance program.  It is not     intended to diagnose MRSA     infection nor to guide or     monitor treatment for     MRSA infections.   Studies/Results: Dg Orthopantogram  07/03/2013   CLINICAL DATA:  Anterior maxillary abscess  EXAM: ORTHOPANTOGRAM/PANORAMIC  COMPARISON:  None.  FINDINGS: Extensive dental caries identified. The patient is partly edentulous. Lucency about the apices of multiple teeth could signify dental abscess. These findings would be better evaluated at dedicated dental imaging. Mandibular condyles and rami are normal.  IMPRESSION: Extensive dental caries.  Dedicated dental imaging is recommended.   Electronically Signed   By: Christiana Pellant M.D.   On: 07/03/2013 09:05   Medications: I have reviewed the patient's current medications. Scheduled Meds: . clindamycin  300 mg Oral Q6H  . feeding supplement (GLUCERNA SHAKE)  237 mL Oral TID BM  . heparin  5,000 Units Subcutaneous Q8H  . insulin aspart  0-9 Units Subcutaneous TID WC  . insulin glargine  30 Units Subcutaneous Daily  . sodium chloride  3 mL  Intravenous Q12H   Continuous Infusions: . sodium chloride 75 mL/hr at 07/04/13 1100   PRN Meds:.dextrose, morphine injection, ondansetron (ZOFRAN) IV, ondansetron (ZOFRAN) IV, ondansetron  Assessment/Plan: 37 year old male with recently diagnosed DM type 1 and psoriasis presenting with two day history of N/V and decreased po.   #DKA - poc BG 460 in the ED. Metabolic acidosis with pH of 7.095, bicarb 4.3. Etiology likely dental abscess. Insulin gtt --> Lantus 07/03/13.  AG now 7.  Patient feeling well, tolerating po. - SDU --> floor - NSS 51mL/hr - BMET q8hr, monitor AG and K  - Zofran prn, morphine prn   #Dental abscess - the patient reports having previously planned to get all of his teeth removed. He was seen yesterday by his PCP and referred to dentist. He reports the abscess has been causing him increased pain for the past few days.  Dental xray concerning for abscess.  OMFS will evaluate him after discharge. - IV clindamycin --> po clindamycin 300mg  qid  #Psoriasis - The patient reports a long history of what he was told was eczema. However, he says he was recently diagnosed with psoriasis. He has extremely scaly plaques on the extremities, particularly on his ankles and feet. He has been referred to Dermatology by his PCP.  - outpatient Derm follow-up   #DVT ppx -  heparin   Dispo:  Likely discharge tomorrow.  The patient does have a current PCP (Jeanann Lewandowsky, MD) and does need an [facility] hospital follow-up appointment after discharge.  The patient does not know have transportation limitations that hinder transportation to clinic appointments.  .Services Needed at time of discharge: Y = Yes, Blank = No PT:   OT:   RN:   Equipment:   Other:     LOS: 2 days   Evelena Peat, DO 07/04/2013, 2:40 PM

## 2013-07-04 NOTE — Progress Notes (Signed)
  Date: 07/04/2013  Patient name: Jared Murray  Medical record number: [id number]  Date of birth: 1975/09/26   This patient has been seen and the plan of care was discussed with the house staff. Please see their note for complete details. I concur with their findings with the following additions/corrections:  Abscess in mouth has started to drain on its own and he is able to tolerate pills, switching to PO abx.  Mr. Lapoint is improving and can likely be discharged home tomorrow.   Inez Catalina, MD 07/04/2013, 3:40 PM

## 2013-07-04 NOTE — Progress Notes (Addendum)
Inpatient Diabetes Program Recommendations  AACE/ADA: New Consensus Statement on Inpatient Glycemic Control (2013)  Target Ranges:  Prepandial:   less than 140 mg/dL      Peak postprandial:   less than 180 mg/dL (1-2 hours)      Critically ill patients:  140 - 180 mg/dL   Reason for Visit: Results for MARSTON, MCCADDEN (MRN [id number]) as of 07/04/2013 11:10  Ref. Range 07/03/2013 08:08 07/03/2013 11:51 07/03/2013 16:48 07/03/2013 21:54 07/04/2013 07:55  Glucose-Capillary Latest Range: 70-99 mg/dL 119 (H) 147 (H) 78 829 (H) 144 (H)   Please consider adding Novolog 4 units tid with meals (hold if patient eats less than 50%).  Beryl Meager, RN, BC-ADM Inpatient Diabetes Coordinator Pager [phone]     Addendum:  Spoke to patient regarding home diabetes regimen.  He admits that he was not taking insulin consistently especially when he was feeling bad.  Discussed importance of checking CBG's more closely when sick due to tendency for CBG's to increase with infection and sickness.  He has not attended any education classes for diabetes.  Placed referral per protocol for Outpatient diabetes education.  Will likely need 1:1.  Patient is agreeable to attend.

## 2013-07-05 DIAGNOSIS — D72829 Elevated white blood cell count, unspecified: Secondary | ICD-10-CM

## 2013-07-05 DIAGNOSIS — K047 Periapical abscess without sinus: Principal | ICD-10-CM

## 2013-07-05 DIAGNOSIS — E1010 Type 1 diabetes mellitus with ketoacidosis without coma: Secondary | ICD-10-CM

## 2013-07-05 DIAGNOSIS — E41 Nutritional marasmus: Secondary | ICD-10-CM

## 2013-07-05 LAB — BASIC METABOLIC PANEL
BUN: 3 mg/dL — ABNORMAL LOW (ref 6–23)
CO2: 26 mEq/L (ref 19–32)
Calcium: 8.3 mg/dL — ABNORMAL LOW (ref 8.4–10.5)
Chloride: 100 mEq/L (ref 96–112)
Creatinine, Ser: 0.51 mg/dL (ref 0.50–1.35)
GFR calc non Af Amer: >90 mL/min (ref >90)
Glucose, Bld: 194 mg/dL — ABNORMAL HIGH (ref 70–99)

## 2013-07-05 LAB — CBC WITH DIFFERENTIAL/PLATELET
Basophils Relative: 1 % (ref 0–1)
Eosinophils Absolute: 0.6 10*3/uL (ref 0.0–0.7)
HCT: 38.2 % — ABNORMAL LOW (ref 39.0–52.0)
Hemoglobin: 13.9 g/dL (ref 13.0–17.0)
Lymphocytes Relative: 29 % (ref 12–46)
MCHC: 36.4 g/dL — ABNORMAL HIGH (ref 30.0–36.0)
Monocytes Relative: 15 % — ABNORMAL HIGH (ref 3–12)
Neutro Abs: 0.8 10*3/uL — ABNORMAL LOW (ref 1.7–7.7)
RDW: 12.8 % (ref 11.5–15.5)

## 2013-07-05 LAB — PHOSPHORUS: Phosphorus: 2.8 mg/dL (ref 2.3–4.6)

## 2013-07-05 LAB — GLUCOSE, CAPILLARY: Glucose-Capillary: 166 mg/dL — ABNORMAL HIGH (ref 70–99)

## 2013-07-05 LAB — MAGNESIUM: Magnesium: 1.6 mg/dL (ref 1.5–2.5)

## 2013-07-05 MED ORDER — AZITHROMYCIN 250 MG PO TABS: 250.0000 mg | ORAL_TABLET | Freq: Every day | ORAL | Status: DC

## 2013-07-05 MED ORDER — GLUCERNA SHAKE PO LIQD: 237.0000 mL | Freq: Three times a day (TID) | ORAL | Status: DC

## 2013-07-05 MED ORDER — INSULIN GLARGINE 100 UNIT/ML ~~LOC~~ SOLN: 30.0000 [IU] | Freq: Every day | SUBCUTANEOUS | Status: DC

## 2013-07-05 MED ORDER — MAGNESIUM OXIDE 400 (241.3 MG) MG PO TABS
400.0000 mg | ORAL_TABLET | Freq: Every day | ORAL | Status: DC
  Administered 2013-07-05: 400 mg via ORAL
  Filled 2013-07-05 (×2): qty 1

## 2013-07-05 NOTE — Discharge Summary (Signed)
Patient Name:  Jared Murray  MRN: [id number]  PCP: Jeanann Lewandowsky, MD  DOB:  Sep 28, 1975       Date of Admission:  07/02/2013  Date of Discharge:  07/05/2013      Attending Physician: Dr. Inez Catalina, MD         DISCHARGE DIAGNOSES: 1.   DKA (diabetic ketoacidoses) 2.   Psoriasis   DISPOSITION AND FOLLOW-UP: Jared Murray is to follow-up with the listed providers as detailed below, at patient's visiting, please address following issues:  1) Neutropenia - likely due to clindamycin which was stopped at discharge.  Please check CBC to ensure WBC and absolute neutrophil count to ensure this is improving.  2) DM type 1 - insulin reduced to 30 units in the hospital.    3) Dental abscess - patient was to follow-up with oral-maxillofacial surgery the afternoon he was discharged.  Did he follow-up?  4) Psoriasis - has he scheduled the appointment with Dermatology?  Follow-up Information   Follow up with Jeanann Lewandowsky, MD.   Specialty:  Internal Medicine   Contact information:   [address] [place] [address] [phone]      Discharge Orders   Future Appointments Provider Department Dept Phone   07/08/2013 9:00 AM [facility] [phone]   07/08/2013 10:15 AM [facility] Covering Provider [facility] [phone]   Future Orders Complete By Expires   Ambulatory referral to Nutrition and Diabetic Education  As directed    Comments:     Please call patient at [phone] or patient's wife [phone] to set up appointment.  Patient is has Type 1 Diabetes and will need 1:1.   Call MD for:  difficulty breathing, headache or visual disturbances  As directed    Call MD for:  extreme fatigue  As directed    Call MD for:  persistant dizziness or light-headedness  As directed    Call MD for:  persistant nausea and vomiting  As directed    Call MD for:  redness, tenderness, or signs  of infection (pain, swelling, redness, odor or green/yellow discharge around incision site)  As directed    Call MD for:  severe uncontrolled pain  As directed    Call MD for:  temperature >100.4  As directed    Diet - low sodium heart healthy  As directed    Increase activity slowly  As directed       DISCHARGE MEDICATIONS:   Medication List    STOP taking these medications       clindamycin 300 MG capsule  Commonly known as:  CLEOCIN      TAKE these medications       azithromycin 250 MG tablet  Commonly known as:  ZITHROMAX  Take 1 tablet (250 mg total) by mouth daily. Take for 3 days.     clobetasol cream 0.05 %  Commonly known as:  TEMOVATE  Apply 1 application topically 2 (two) times daily.     feeding supplement (GLUCERNA SHAKE) Liqd  Take 237 mLs by mouth 3 (three) times daily between meals.     HYDROcodone-acetaminophen 10-325 MG per tablet  Commonly known as:  NORCO  Take 1 tablet by mouth every 8 (eight) hours as needed.     insulin glargine 100 UNIT/ML injection  Commonly known as:  LANTUS  Inject 0.3 mLs (30 Units total) into the skin daily.     NOVOLOG  100 UNIT/ML injection  Generic drug:  insulin aspart  Sliding scale CBG 70 - 120: 0 units CBG 121 - 150: 1 unit,  CBG 151 - 200: 2 units,  CBG 201 - 250: 3 units,  CBG 251 - 300: 5 units,  CBG 301 - 350: 7 units,  CBG 351 - 400: 9 units   CBG > 400: 10 units     Syringe (Disposable) 1 ML Misc  0.25 mLs by Does not apply route daily.         CONSULTS:    Oral Maxillofacial Surgery   PROCEDURES PERFORMED:  Dg Orthopantogram  07/03/2013   CLINICAL DATA:  Anterior maxillary abscess  EXAM: ORTHOPANTOGRAM/PANORAMIC  COMPARISON:  None.  FINDINGS: Extensive dental caries identified. The patient is partly edentulous. Lucency about the apices of multiple teeth could signify dental abscess. These findings would be better evaluated at dedicated dental imaging. Mandibular condyles and rami are normal.   IMPRESSION: Extensive dental caries.  Dedicated dental imaging is recommended.   Electronically Signed   By: Christiana Pellant M.D.   On: 07/03/2013 09:05   Dg Chest Port 1v Same Day  07/02/2013   CLINICAL DATA:  Shortness of breath, diabetic ketoacidosis, vomiting  EXAM: PORTABLE CHEST - 1 VIEW SAME DAY  COMPARISON:  03/16/2013  FINDINGS: The heart size and mediastinal contours are within normal limits. Both lungs are clear. The visualized skeletal structures are unremarkable.  IMPRESSION: No active disease.   Electronically Signed   By: Ruel Favors M.D.   On: 07/02/2013 11:30       ADMISSION DATA: H&P: [name] presents with a two day history of N/V, decreased po. He reports he has ongoing tooth pain with significantly increased pain since yesterday. He was recently diagnosed with DM type 1 after a DKA admission (07/30-08/04/14). He reports compliance with his insulin regimen. He takes Lantus and SSI at home and reports he last took 7 units insulin yesterday morning. He denies fever/chills/diarrhea. His sister reports he has had polyuria and was recently treated for a UTI.   Physical Exam: Blood pressure 156/96, pulse 120, temperature 97.4 F (36.3 C), temperature source Oral, resp. rate 20, SpO2 100.00%.  General: resting in bed in mild distress  HEENT: poor dentition, abscess noted anterior maxilla, collection on palate  Cardiac: tachycardic, regular rhythm, no rubs, murmurs or gallops  Pulm: clear to auscultation bilaterally, + Kussmaul's respirations  Abd: soft, nontender, nondistended, BS present  Ext: warm and well perfused, no pedal edema, scaly plaque psoriasis on extremities especially severe on feet  Neuro: alert and oriented X3, mentating appropriately, able to speak and follow commands  Labs: Basic Metabolic Panel:   Recent Labs   07/02/13 0648  07/02/13 0701   NA  133*  132*   K  4.9  4.8   CL  85*  99   CO2  <7*  --   GLUCOSE  490*  496*   BUN  11  12     CREATININE  0.89  1.00   CALCIUM  9.6  --    CBC:   Recent Labs   07/02/13 0648  07/02/13 0701   WBC  10.5  --   NEUTROABS  9.5*  --   HGB  18.2*  19.7*   HCT  50.4  58.0*   MCV  95.8  --   PLT  229  --    CBG:   Recent Labs   07/02/13 0643   GLUCAP  521*    Thyroid Function Tests:   Recent Labs   07/01/13 0943   TSH  2.095    HOSPITAL COURSE: DKA, resolved/DM type 1 - Patient was admitted with AG metabolic acidosis (pH of 7.095, bicarb 4.3, AG > 40), ketonuria and BG 490.  Etiology was thought to be decreased po, N/V in the setting of a dental abscess.  He improved on insulin drip and Lantus was resumed on hospital day 2.  His BG remained stable on Lantus 30 units daily and SSI.  He was discharged on hospital day 4 in stable condition.  Lantus 30 units was continued at discharge.  Will defer to PCP for further management of DM.   Neutropenia - WBC trended down 10.5 --> 1.6. WBC 2.5 on day of discharge. Unlikely dilutional as other cell lines are normal. Likely caused by clindamycin as his drop coincided with initiation of this antibiotic. Changed clindamycin ---> azithromycin. Patient was instructed to go to ED with fever/chills. Patient to follow-up with PCP on 07/08/13 for repeat CBC.   Dental abscess - The patient reported having previously planned to get all of his teeth removed. He was seen the day prior to admission by his PCP and referred to dentist. He reported the abscess has been causing him increased pain for the past few days.  On day of discharge, clindamycin 300mg  qid was changed to po azithromycin 250mg  daily for three more days.  OMFS (Dr. Jeanice Lim) will evaluate him immediately after discharge.  Severe malnutrition - He was evaluated by a dietician during admission. He had moderate depletion of muscle mass and intake < 50% of his energy requirement.  Started on Glucerna and given prescription at discharge.  Psoriasis - He was recently diagnosed with psoriasis.  He had extremely scaly plaques on the extremities, particularly on his ankles and feet. He has been referred to Dermatology by his PCP.   DISCHARGE DATA: Vital Signs: BP 117/82  Pulse 89  Temp(Src) 97.7 F (36.5 C) (Oral)  Resp 19  Ht 5\' 10"  (1.778 m)  Wt 58.968 kg (130 lb)  BMI 18.65 kg/m2  SpO2 100%  Labs: Results for orders placed during the hospital encounter of 07/02/13 (from the past 24 hour(s))  GLUCOSE, CAPILLARY     Status: Abnormal   Collection Time    07/04/13 12:12 PM      Result Value Range   Glucose-Capillary 244 (*) 70 - 99 mg/dL  GLUCOSE, CAPILLARY     Status: Abnormal   Collection Time    07/04/13  4:20 PM      Result Value Range   Glucose-Capillary 227 (*) 70 - 99 mg/dL  BASIC METABOLIC PANEL     Status: Abnormal   Collection Time    07/04/13  7:20 PM      Result Value Range   Sodium 133 (*) 135 - 145 mEq/L   Potassium 3.6  3.5 - 5.1 mEq/L   Chloride 98  96 - 112 mEq/L   CO2 28  19 - 32 mEq/L   Glucose, Bld 170 (*) 70 - 99 mg/dL   BUN 4 (*) 6 - 23 mg/dL   Creatinine, Ser 1.61  0.50 - 1.35 mg/dL   Calcium 8.3 (*) 8.4 - 10.5 mg/dL   GFR calc non Af Amer >90  >90 mL/min   GFR calc Af Amer >90  >90 mL/min  GLUCOSE, CAPILLARY     Status: Abnormal   Collection Time    07/04/13  9:37 PM  Result Value Range   Glucose-Capillary 173 (*) 70 - 99 mg/dL  BASIC METABOLIC PANEL     Status: Abnormal   Collection Time    07/05/13  5:52 AM      Result Value Range   Sodium 136  135 - 145 mEq/L   Potassium 3.8  3.5 - 5.1 mEq/L   Chloride 100  96 - 112 mEq/L   CO2 26  19 - 32 mEq/L   Glucose, Bld 194 (*) 70 - 99 mg/dL   BUN 3 (*) 6 - 23 mg/dL   Creatinine, Ser 9.14  0.50 - 1.35 mg/dL   Calcium 8.3 (*) 8.4 - 10.5 mg/dL   GFR calc non Af Amer >90  >90 mL/min   GFR calc Af Amer >90  >90 mL/min  CBC WITH DIFFERENTIAL     Status: Abnormal   Collection Time    07/05/13  5:52 AM      Result Value Range   WBC 2.5 (*) 4.0 - 10.5 K/uL   RBC 4.10 (*) 4.22 - 5.81  MIL/uL   Hemoglobin 13.9  13.0 - 17.0 g/dL   HCT 78.2 (*) 95.6 - 21.3 %   MCV 93.2  78.0 - 100.0 fL   MCH 33.9  26.0 - 34.0 pg   MCHC 36.4 (*) 30.0 - 36.0 g/dL   RDW 08.6  57.8 - 46.9 %   Platelets 209  150 - 400 K/uL   Neutrophils Relative % 33 (*) 43 - 77 %   Lymphocytes Relative 29  12 - 46 %   Monocytes Relative 15 (*) 3 - 12 %   Eosinophils Relative 22 (*) 0 - 5 %   Basophils Relative 1  0 - 1 %   Neutro Abs 0.8 (*) 1.7 - 7.7 K/uL   Lymphs Abs 0.7  0.7 - 4.0 K/uL   Monocytes Absolute 0.4  0.1 - 1.0 K/uL   Eosinophils Absolute 0.6  0.0 - 0.7 K/uL   Basophils Absolute 0.0  0.0 - 0.1 K/uL  GLUCOSE, CAPILLARY     Status: Abnormal   Collection Time    07/05/13  8:02 AM      Result Value Range   Glucose-Capillary 212 (*) 70 - 99 mg/dL   Comment 1 Notify RN       Services Ordered on Discharge: Y = Yes; Blank = No PT:   OT:   RN:   Equipment:   Other:      Time Spent on Discharge: 35 min   Signed: Evelena Peat PGY 1, Internal Medicine Resident 07/05/2013, 11:33 AM

## 2013-07-05 NOTE — Progress Notes (Signed)
Subjective: [name] was seen and examined this morning.  He reports good appetite, denies N/V or diarrhea.   He feels his abscess is smaller and his dental pain is improved.  He feels well enough for discharge.   Objective: Vital signs in last 24 hours: Filed Vitals:   07/04/13 1235 07/04/13 2126 07/05/13 0158 07/05/13 0506  BP: 113/92 130/92 100/68 112/70  Pulse: 86 99 88 83  Temp: 98.5 F (36.9 C) 98.4 F (36.9 C) 98.9 F (37.2 C) 98.8 F (37.1 C)  TempSrc: Oral Oral Oral Oral  Resp: 18 16 18 17   Height:      Weight:      SpO2: 100% 98% 96% 100%   Weight change:   Intake/Output Summary (Last 24 hours) at 07/05/13 0925 Last data filed at 07/05/13 0510  Gross per 24 hour  Intake   2013 ml  Output   1275 ml  Net    738 ml   General: resting in bed in NAD Cardiac: RRR, no rubs, murmurs or gallops Pulm: clear to auscultation bilaterally, moving normal volumes of air Abd: soft, nontender, nondistended, BS present Ext: warm and well perfused, no pedal edema Skin:  Plaque psoriasis on extremities, severe on ankles and feet Neuro: alert and oriented X3  Lab Results: Basic Metabolic Panel:  Recent Labs Lab 07/03/13 2305 07/04/13 0058  07/04/13 1920 07/05/13 0552  NA 132* 134*  < > 133* 136  K 3.2* 3.4*  < > 3.6 3.8  CL 100 101  < > 98 100  CO2 24 24  < > 28 26  GLUCOSE 191* 96  < > 170* 194*  BUN 3* 3*  < > 4* 3*  CREATININE 0.48* 0.48*  < > 0.50 0.51  CALCIUM 8.2* 8.4  < > 8.3* 8.3*  MG  --  1.3*  --   --   --   PHOS  --  1.6*  --   --   --   < > = values in this interval not displayed. Liver Function Tests:  Recent Labs Lab 07/02/13 1215  AST 13  ALT 10  ALKPHOS 103  BILITOT 0.4  PROT 7.1  ALBUMIN 3.3*   CBC:  Recent Labs Lab 07/04/13 0058 07/04/13 1115 07/05/13 0552  WBC 3.2* 1.6* 2.5*  NEUTROABS 1.7  --  0.8*  HGB 13.7 14.1 13.9  HCT 37.0* 38.8* 38.2*  MCV 93.0 93.0 93.2  PLT 191 200 209   CBG:  Recent Labs Lab 07/03/13 2154  07/04/13 0755 07/04/13 1212 07/04/13 1620 07/04/13 2137 07/05/13 0802  GLUCAP 136* 144* 244* 227* 173* 212*   Thyroid Function Tests:  Recent Labs Lab 07/01/13 0943  TSH 2.095   Urine Drug Screen: Drugs of Abuse     Component Value Date/Time   LABOPIA POSITIVE* 07/02/2013 0721   COCAINSCRNUR NONE DETECTED 07/02/2013 0721   LABBENZ NONE DETECTED 07/02/2013 0721   AMPHETMU NONE DETECTED 07/02/2013 0721   THCU NONE DETECTED 07/02/2013 0721   LABBARB NONE DETECTED 07/02/2013 0721    Urinalysis:  Recent Labs Lab 07/02/13 0721  COLORURINE YELLOW  LABSPEC 1.026  PHURINE 5.0  GLUCOSEU >1000*  HGBUR TRACE*  BILIRUBINUR NEGATIVE  KETONESUR >80*  PROTEINUR 30*  UROBILINOGEN 0.2  NITRITE NEGATIVE  LEUKOCYTESUR NEGATIVE   Micro Results: Recent Results (from the past 240 hour(s))  MRSA PCR SCREENING     Status: None   Collection Time    07/02/13  1:45 PM  Result Value Range Status   MRSA by PCR NEGATIVE  NEGATIVE Final   Comment:            The GeneXpert MRSA Assay (FDA     approved for NASAL specimens     only), is one component of a     comprehensive MRSA colonization     surveillance program. It is not     intended to diagnose MRSA     infection nor to guide or     monitor treatment for     MRSA infections.   Studies/Results: No results found. Medications: I have reviewed the patient's current medications. Scheduled Meds: . clindamycin  300 mg Oral Q6H  . feeding supplement (GLUCERNA SHAKE)  237 mL Oral TID BM  . heparin  5,000 Units Subcutaneous Q8H  . insulin aspart  0-9 Units Subcutaneous TID WC  . insulin glargine  30 Units Subcutaneous Daily  . sodium chloride  3 mL Intravenous Q12H   Continuous Infusions: . sodium chloride 75 mL/hr at 07/04/13 2342   PRN Meds:.dextrose, morphine injection, ondansetron (ZOFRAN) IV, ondansetron (ZOFRAN) IV, ondansetron  Assessment/Plan: 37 year old male with recently diagnosed DM type 1 and psoriasis  presenting with two day history of N/V and decreased po.   #DKA, resolved - poc BG 460 in the ED. Metabolic acidosis with pH of 7.095, bicarb 4.3. Etiology likely dental abscess. Insulin gtt --> Lantus 07/03/13.  AG now closed.  Patient feeling well, tolerating po. - discharge on Lantus 30 units daily and SSI - check BMP at hospital follow-up on 07/08/13  #Neutropenia - WBC trended down 10.5 --> 1.6.  2.5 on day of discharge.  Unlikely dilutional as other cell lines are normal.  Likely caused by clindamycin as he drop coincided with initiation of this antibiotic.  Will change clindamycin ---> azithromycin.  Patient instructed to go to ED with fever/chills.  Patient to follow-up with PCP on 07/08/13 for repeat CBC.     #Dental abscess - the patient reports having previously planned to get all of his teeth removed. He was seen yesterday by his PCP and referred to dentist. He reports the abscess has been causing him increased pain for the past few days.  Dental xray concerning for abscess.  OMFS will evaluate him after discharge. - change po clindamycin 300mg  qid --> po azithromycin 250mg  daily for three more days - follow-up with Oral Maxillofacial Surgery this afternoon after discharge  #Severe malnutrition - patient evaluated by dietician.  He has moderate depletion of muscle mass and intake < 50% of his energy requirement.   - Will have him continue Glucerna as an outpatient.    #Psoriasis -  recently diagnosed with psoriasis. He has extremely scaly plaques on the extremities, particularly on his ankles and feet. He has been referred to Dermatology by his PCP.  - outpatient Derm follow-up   #DVT ppx - [place] heparin   Dispo:  The patient's AG has remained closed, his CBGs are stable, he is tolerating po and is stable for discharge home today.  OMFS will see patient this afternoon after discharge for further management of dental abscess.  Hospital follow-up with PCP at [facility]  scheduled for Friday 07/08/13 at 10:15.  The patient does have a current PCP (Jeanann Lewandowsky, MD) and does need an [facility] hospital follow-up appointment after discharge.  The patient does not know have transportation limitations that hinder transportation to clinic appointments.  .Services Needed at time of discharge:  Y = Yes, Blank = No PT:   OT:   RN:   Equipment:   Other:     LOS: 3 days   Evelena Peat, DO 07/05/2013, 9:25 AM

## 2013-07-05 NOTE — Clinical Documentation Improvement (Signed)
THIS DOCUMENT IS NOT A PERMANENT PART OF THE MEDICAL RECORD  Please update your documentation with the medical record to reflect your response to this query. If you need help knowing how to do this please call [phone].  07/05/13   To Evelena Peat, D. O./ Associates,  In a better effort to capture your patient's severity of illness, reflect appropriate length of stay and utilization of resources, a review of the patient medical record has revealed the following indicators.    Based on your clinical judgment, please clarify and document in a progress note and/or discharge summary the clinical condition associated with the following supporting information:  In responding to this query please exercise your independent judgment.  The fact that a query is asked, does not imply that any particular answer is desired or expected.  Per nutrition note "Severe malnutrition in the context of acute illness or injury" , " evidenced by moderate depletion of muscle mass and intake </= 50% of estimated energy requirement for >/= 5 days"  and BMI 18.65.  Please document appropriate dx. Thank you   Possible Clinical Conditions?  Mild Malnutrition  Moderate Malnutrition Severe Malnutrition   Protein Calorie Malnutrition Severe Protein Calorie Malnutrition Other Condition Cannot clinically determine      Risk Factors:DKA related to tooth abscess   Ht5\' 10"  (1.778 m)    Wt130 lb (58.968 kg)    BMI:18.65  Nutrition Recommendations: Glucerna Shake PO TID, each supplement provides 220 kcal and 10 grams of protein    You may use possible, probable, or suspect with inpatient documentation. possible, probable, suspected diagnoses MUST be documented at the time of discharge  Reviewed: additional documentation in the medical record  Thank Arrie Eastern  Clinical Documentation Specialist: [phone] Health Information Management [place]

## 2013-07-05 NOTE — Progress Notes (Signed)
CSW was contacted by Dr. Andrey Campanile concerning transportation for Pt to home after d/c and dentist visit directly after his d/c. Pt's insurance is Medicaid and CSW will contact Medicaid transportation and see about pickup from dentist office post visit. If Pt is unable to receive a ride from dentist office then CSW will provide Pt with a bus pass to return home.    CSW will continue to follow Pt for d/c planning.   Leron Croak  MSW, LCSWA  [facility]

## 2013-07-05 NOTE — Progress Notes (Signed)
Subjective: 37 year old male with recently diagnosed DM type 1 and psoriasis presenting with two day history of N/V and decreased po intake admitted for DKA likely associated with dental abscess. No acute events overnight.  Mr. Nees reports that the abscess on the roof of his mouth has continued to drain and he has not experienced symptoms for 2 days now. He reports to have a mild bad taste in his mouth that he reports is accompanied by thick saliva. His appetite has returned to normal and he denies nausea following. He has had adequate urine output.Marland Kitchen He denies constipation, N/V, fevers, chills or pain otherwise and reports the is continuing to improve subjectively.   Objective: Vital signs in last 24 hours: Filed Vitals:   07/04/13 2126 07/05/13 0158 07/05/13 0506 07/05/13 1032  BP: 130/92 100/68 112/70 117/82  Pulse: 99 88 83 89  Temp: 98.4 F (36.9 C) 98.9 F (37.2 C) 98.8 F (37.1 C) 97.7 F (36.5 C)  TempSrc: Oral Oral Oral Oral  Resp: 16 18 17 19   Height:      Weight:      SpO2: 98% 96% 100% 100%   Weight change:   Intake/Output Summary (Last 24 hours) at 07/05/13 1258 Last data filed at 07/05/13 0510  Gross per 24 hour  Intake   2013 ml  Output   1275 ml  Net    738 ml   Physical Exam: General: Resting in bed in no acute distress  HEENT: Generalized poor dentition, abscess noted in the anterior maxilla that appears to be reduced in size from   Cardiac: Regular rate and rhythm, no rubs, murmurs or gallops  Pulm: clear to auscultation bilaterally, (-)Kussmaul's respirations this AM.  Abd: soft, nontender, nondistended, BS hyperactive.  Ext: warm and well perfused, no pedal edema, scaly plaque psoriasis on extremities especially severe on feet that are unchanged from yesterday.  Neuro: alert and oriented X3, mentating appropriately, able to speak and follow commands  Lab Results: CBC    Component Value Date/Time   WBC 2.5* 07/05/2013 0552   RBC 4.10* 07/05/2013  0552   HGB 13.9 07/05/2013 0552   HCT 38.2* 07/05/2013 0552   PLT 209 07/05/2013 0552   MCV 93.2 07/05/2013 0552   MCH 33.9 07/05/2013 0552   MCHC 36.4* 07/05/2013 0552   RDW 12.8 07/05/2013 0552   LYMPHSABS 0.7 07/05/2013 0552   MONOABS 0.4 07/05/2013 0552   EOSABS 0.6 07/05/2013 0552   BASOSABS 0.0 07/05/2013 0552   CMP     Component Value Date/Time   NA 136 07/05/2013 0552   K 3.8 07/05/2013 0552   CL 100 07/05/2013 0552   CO2 26 07/05/2013 0552   GLUCOSE 194* 07/05/2013 0552   BUN 3* 07/05/2013 0552   CREATININE 0.51 07/05/2013 0552   CALCIUM 8.3* 07/05/2013 0552   PROT 7.1 07/02/2013 1215   ALBUMIN 3.3* 07/02/2013 1215   AST 13 07/02/2013 1215   ALT 10 07/02/2013 1215   ALKPHOS 103 07/02/2013 1215   BILITOT 0.4 07/02/2013 1215   GFRNONAA >90 07/05/2013 0552   GFRAA >90 07/05/2013 0552   CBG (last 3)   Recent Labs  07/04/13 2137 07/05/13 0802 07/05/13 1201  GLUCAP 173* 212* 166*    Micro Results: Recent Results (from the past 240 hour(s))  MRSA PCR SCREENING     Status: None   Collection Time    07/02/13  1:45 PM      Result Value Range Status   MRSA by  PCR NEGATIVE  NEGATIVE Final   Comment:            The GeneXpert MRSA Assay (FDA     approved for NASAL specimens     only), is one component of a     comprehensive MRSA colonization     surveillance program. It is not     intended to diagnose MRSA     infection nor to guide or     monitor treatment for     MRSA infections.   Studies/Results: No results found. Medications: I have reviewed the patient's current medications. Scheduled Meds:  feeding supplement (GLUCERNA SHAKE)  237 mL Oral TID BM   heparin  5,000 Units Subcutaneous Q8H   insulin aspart  0-9 Units Subcutaneous TID WC   insulin glargine  30 Units Subcutaneous Daily   sodium chloride  3 mL Intravenous Q12H   Continuous Infusions:  sodium chloride 75 mL/hr at 07/04/13 2342   PRN Meds:.dextrose, morphine injection,  ondansetron (ZOFRAN) IV, ondansetron (ZOFRAN) IV, ondansetron  Assessment/Plan: Principal Problem:   DKA (diabetic ketoacidoses) Active Problems:   Psoriasis  Mr. Cutsforth is a 37 year old male with recently diagnosed DM type 1 and psoriasis presenting with two day history of N/V and decreased po admitted on 11/15 for DKA likely associated with dental abscess.  #DKA - Recent blood glucoses have improved since admission. Metabolic acidosis with pH of 7.095, bicarb 4.3 appears to be resolving, His gap has remained closed (10). Etiology likely dental abscess. ST changes on EKG but no CP or significant cardiac risk factors and poc Trop negative making MI less likely. No URI symptoms and CXR negative for PNA. UA negative for UTI.  - Home Lantus resumed yesterday.  - Plan to discharge on 30 U Lantus daily and sliding scale Novalog.  - NSS 70mL/hr  - D5 - 1/2NSS 83mL/hr prn - SSI  - Zofran prn, morphine prn   #Dental abscess - The patient reports having previously planned to get all of his teeth removed. Abscess is asymptomatic today and continuous to drain. Mr. Uhrich was prescribed a 7 day course of Clindamycin prior to admission that he had not started.   - OMFS Consulted and plan to see patient at their office as outpatient immediately after discharge (Dr. Jeanice Lim DDS) - Convert to Azithromycin at discharge due progression of leukopenia.   #Psoriasis - The patient reports a long history of what he was told was eczema. However, he says he was recently diagnosed with psoriasis. He has extremely scaly plaques on the extremities, particularly on his ankles and feet. He has been referred to Dermatology by his PCP.  - Plan for outpatient Derm follow-up   #DVT ppx - [place] heparin   Dispo: Discharge today. Mr Belsito will report directly to the outpatient oral surgeons office (Dr. Jeanice Lim) on [address] and will be provided a bus pass for home commute after his appointment.   This is a Psychologist, occupational  Note.  The care of the patient was discussed with Dr. Andrey Campanile and the assessment and plan formulated with their assistance.  Please see their attached note for official documentation of the daily encounter.   LOS: 3 days   Lalla Brothers MS3 07/05/2013, 12:58 PM

## 2013-07-05 NOTE — Discharge Planning (Signed)
Patient discharged home in stable condition. Verbalizes understanding of all discharge instructions, including home medications and follow up appointments. 

## 2013-07-05 NOTE — Progress Notes (Signed)
CSW spoke with Medicaid transportation for Pt d/c to home.   Pt currently is not enrooled with Medicaid transport and Pt will need to see his Medicaid worker for an assessment and then be enrolled into the program prior to utilization of their services.   CSW will provide Pt with a bus pass for assistance with transportation home post dentist visit.    Leron Croak [facility]  [address];  [phone] Phone: [phone]

## 2013-07-05 NOTE — Progress Notes (Signed)
BSW student met with Pt at bedside. Pt does not need any transportation assistance because his sister is coming to pick him up. BSW student give Pt information on [employer].  No further assistance needed at this time.  Orlander Norwood Jackolyn Confer Emergency Room [phone]

## 2013-07-05 NOTE — Progress Notes (Signed)
I agree with progress note by Student Doctor McDaniel.  Please see my note as well. 

## 2013-07-08 ENCOUNTER — Ambulatory Visit (HOSPITAL_BASED_OUTPATIENT_CLINIC_OR_DEPARTMENT_OTHER): Payer: Medicaid Other | Admitting: Internal Medicine

## 2013-07-08 ENCOUNTER — Encounter: Payer: Self-pay | Admitting: Internal Medicine

## 2013-07-08 ENCOUNTER — Ambulatory Visit: Payer: Medicaid Other | Attending: Internal Medicine

## 2013-07-08 VITALS — BP 109/73 | HR 65 | Temp 98.6°F | Resp 14

## 2013-07-08 DIAGNOSIS — E1110 Type 2 diabetes mellitus with ketoacidosis without coma: Secondary | ICD-10-CM

## 2013-07-08 DIAGNOSIS — E1165 Type 2 diabetes mellitus with hyperglycemia: Secondary | ICD-10-CM | POA: Insufficient documentation

## 2013-07-08 DIAGNOSIS — IMO0001 Reserved for inherently not codable concepts without codable children: Secondary | ICD-10-CM | POA: Insufficient documentation

## 2013-07-08 DIAGNOSIS — L408 Other psoriasis: Secondary | ICD-10-CM

## 2013-07-08 DIAGNOSIS — E1310 Other specified diabetes mellitus with ketoacidosis without coma: Secondary | ICD-10-CM

## 2013-07-08 NOTE — Progress Notes (Signed)
Patient ID: Jared Murray, male   DOB: 05-Jun-1976, 37 y.o.   MRN: [id number]  CC: Followup  HPI: 37 year old male with past medical history of eczema, diabetes we'll presented to clinic for followup. Patient has no current complaints. No chest pain or shortness of breath. No abdominal pain, nausea or vomiting. No indigestion.  Allergies  Allergen Reactions  . Penicillins     Childhood allergy   Past Medical History  Diagnosis Date  . Eczema   . Type 1 diabetes mellitus   . Psoriasis    Current Outpatient Prescriptions on File Prior to Visit  Medication Sig Dispense Refill  . azithromycin (ZITHROMAX) 250 MG tablet Take 1 tablet (250 mg total) by mouth daily. Take for 3 days.  3 each  0  . clobetasol cream (TEMOVATE) 0.05 % Apply 1 application topically 2 (two) times daily.  30 g  2  . feeding supplement, GLUCERNA SHAKE, (GLUCERNA SHAKE) LIQD Take 237 mLs by mouth 3 (three) times daily between meals.  24 Can  1  . HYDROcodone-acetaminophen (NORCO) 10-325 MG per tablet Take 1 tablet by mouth every 8 (eight) hours as needed.  20 tablet  0  . insulin glargine (LANTUS) 100 UNIT/ML injection Inject 0.3 mLs (30 Units total) into the skin daily.  10 mL  12  . NOVOLOG 100 UNIT/ML injection Sliding scale CBG 70 - 120: 0 units CBG 121 - 150: 1 unit,  CBG 151 - 200: 2 units,  CBG 201 - 250: 3 units,  CBG 251 - 300: 5 units,  CBG 301 - 350: 7 units,  CBG 351 - 400: 9 units   CBG > 400: 10 units  1 vial  12  . Syringe, Disposable, 1 ML MISC 0.25 mLs by Does not apply route daily.  100 each  0   No current facility-administered medications on file prior to visit.   Family History  Problem Relation Age of Onset  . Hypertension Father   . Diabetes      multiple  . Lupus Cousin   . Stroke Maternal Grandmother   . Stroke Paternal Grandmother    History   Social History  . Marital Status: Married    Spouse Name: N/A    Number of Children: N/A  . Years of Education: N/A   Occupational  History  . Not on file.   Social History Main Topics  . Smoking status: Current Every Day Smoker  . Smokeless tobacco: Not on file  . Alcohol Use: Yes     Comment: recently quit - 7/14   . Drug Use: Yes    Special: Marijuana  . Sexual Activity: Not on file   Other Topics Concern  . 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  HENT: Negative for ear pain, nosebleeds, congestion, facial swelling, rhinorrhea, neck pain, neck stiffness and ear discharge.   Eyes: Negative for pain, discharge, redness, itching and visual disturbance.  Respiratory: Negative for cough, choking, chest tightness, shortness of breath, wheezing and stridor.   Cardiovascular: Negative for chest pain, palpitations and leg swelling.  Gastrointestinal: Negative for abdominal distention.  Genitourinary: Negative for dysuria, urgency, frequency, hematuria, flank pain, decreased urine volume, difficulty urinating and dyspareunia.  Musculoskeletal: Negative for back pain, joint swelling, arthralgias and gait problem.  Neurological: Negative for  dizziness, tremors, seizures, syncope, facial asymmetry, speech difficulty, weakness, light-headedness, numbness and headaches.  Hematological: Negative for adenopathy. Does not bruise/bleed easily.  Psychiatric/Behavioral: Negative for hallucinations, behavioral problems, confusion, dysphoric mood, decreased concentration and agitation.    Objective:   Filed Vitals:   07/08/13 0934  BP: 109/73  Pulse: 65  Temp: 98.6 F (37 C)  Resp: 14    Physical Exam  Constitutional: Appears well-developed and well-nourished. No distress.  HENT: Normocephalic. External right and left ear normal. Oropharynx is clear and moist.  Eyes: Conjunctivae and EOM are normal. PERRLA, no scleral icterus.  Neck:  Normal ROM. Neck supple. No JVD. No tracheal deviation. No thyromegaly.  CVS: RRR, S1/S2 +, no murmurs, no gallops, no carotid bruit.  Pulmonary: Effort and breath sounds normal, no stridor, rhonchi, wheezes, rales.  Abdominal: Soft. BS +,  no distension, tenderness, rebound or guarding.  Musculoskeletal: Normal range of motion. No edema and no tenderness.  Lymphadenopathy: No lymphadenopathy noted, cervical, inguinal. Neuro: Alert. Normal reflexes, muscle tone coordination. No cranial nerve deficit. Skin: Skin is warm and dry. Psoriasis patches on forhead and hands  Psychiatric: Normal mood and affect. Behavior, judgment, thought content normal.   Lab Results  Component Value Date   WBC 2.5* 07/05/2013   HGB 13.9 07/05/2013   HCT 38.2* 07/05/2013   MCV 93.2 07/05/2013   PLT 209 07/05/2013   Lab Results  Component Value Date   CREATININE 0.51 07/05/2013   BUN 3* 07/05/2013   NA 136 07/05/2013   K 3.8 07/05/2013   CL 100 07/05/2013   CO2 26 07/05/2013    Lab Results  Component Value Date   HGBA1C 11.5 06/15/2013   Lipid Panel  No results found for this basename: chol, trig, hdl, cholhdl, vldl, ldlcalc       Assessment and plan:   Patient Active Problem List   Diagnosis Date Noted  . Type II or unspecified type diabetes mellitus with unspecified complication, uncontrolled 07/08/2013    Priority: High - Will recheck A1c in 2 months from this visit  - Blood sugars at home are under reasonable control. I have discussed proper diet with the patient.   . Psoriasis 07/01/2013    Priority: Medium - Continue clobetasol cream      Neutropenia  - Slowly improving  - Will get CBC in one month from today

## 2013-07-08 NOTE — Progress Notes (Signed)
Pt here f/u Diabetes with insulin dependence CBG- 260, which has improved Pt has diabetes log range 250-460 Denies pain

## 2013-07-08 NOTE — Discharge Summary (Signed)
I saw Mr. Deyton on day of discharge and agree with the discharge plan.

## 2013-07-08 NOTE — Patient Instructions (Signed)
Monitoring for Diabetes  There are two blood tests that help you monitor and manage your diabetes. These include:  · An A1c (hemoglobin A1c) test.  · This test is an average of your glucose (or blood sugar) control over the past 3 months.  · This is recommended as a way for you and your caregiver to understand how well your glucose levels are controlled on the average.  · Your A1c goal will be determined by your caregiver, but it is usually best if it is less than 6.5% to 7.0%.  · Glucose (sugar) attaches itself to red blood cells. The amount of glucose then can then be measured. The amount of glucose on the cells depends on how high your blood glucose has been.  · SMBG test (self-monitoring blood glucose).  · Using a blood glucose monitor (meter) to do SMBG testing is an easy way to monitor the amount of glucose in your blood and can help you improve your control. The monitor will tell you what your blood glucose is at that very moment. Every person with diabetes should have a blood glucose monitor and know how to use it. The better you control your blood sugar on a daily basis, the better your A1c levels will be.  HOW OFTEN SHOULD I HAVE AN A1C LEVEL?  · Every 3 months if your diabetes is not well controlled or if therapy has changed.  · Every 6 months if you are meeting your treatment goals.  HOW OFTEN SHOULD I DO SMBG TESTING?   Your caregiver will recommend how often you should test. Testing times are based on the kind of medicine you take, type of diabetes you have, and your blood glucose control. Testing times can include:  · Type 1 diabetes: test 3 or 4 times a day or as directed.  · Type 2 diabetes and if you are taking insulin and diabetes pills: test 3 or 4 times a day or as directed.  · If you are taking diabetes pills only and not reaching your target A1c: test 2 to 4 times a day or as directed.  · If you are taking diabetes pills and are controlling your diabetes well with diet and exercise, your  caregiver will help you decide what is appropriate.  WHAT TIME OF DAY SHOULD I TEST?   The best time of day to test your blood glucose depends on medications, mealtimes, exercise, and blood glucose control. It is best to test at different times because this will help you know how you are doing throughout the day. Your caregiver will help you decide what is best.  WHAT SHOULD MY BLOOD GLUCOSE BE?  Blood glucose target goals may vary depending on each persons needs, whether they have type 1 or type 2 diabetes or what medications they are taking. However, as a general rule, blood glucose should be:  · Before meals   70-130 mg/dl.  · After meals    ..less than 180 mg/dl.  CHECK YOUR BLOOD GLUCOSE IF:  · You have symptoms of low blood sugar (hypoglycemia), which may include dizziness, shaking, sweating, chills and confusion.  · You have symptoms of high blood sugar (hyperglycemia), which may include sleepiness, blurred vision, frequent urination and excessive thirst.  · You are learning how meals, physical activity and medicine affect your blood glucose level. The more you learn about how various foods, your medications, and activities affect you, the better job you will do of taking care of yourself.  ·   You have a job in which poor control could cause safety problems while driving or operating machinery.  CHECK YOUR BLOOD SUGAR MORE FREQUENTLY:  · If you have medication or dietary changes.  · If you begin taking other kinds of medicines.  · If you become sick or your level of stress increases. With an illness, your blood sugar may even be high without eating.  · Before and after exercise.  Follow your caregiver's testing recommendations during this time.   TO DISPOSE OF SHARPS:  Each city or state may have different regulations. Check with your public works or waste management department.  · Sharps containers can be purchased from pharmacies.  · Place all used sharps in a container. You do not need to replace any  protective covers over the needle or break the needle.  · Sharps should be contained in a ridge, leakproof, puncture-resistant container.  · Plastic detergent bottle.  · Bleach bottle.  · When container is almost full, add a solution that is 1 part laundry bleach and 9 parts tap water (it is okay to use undiluted bleach if you wish). You may want to wear gloves since bleach can damage tissue. Let the solution sit for 30 minutes.  · Carefully pour all the liquid into the sanitary sewer. Be sure to prevent the sharps from falling out.  · Once liquid is drained, reseal the container with lid and tape it shut with duct tape. This will prevent the cap from coming off.  · Dispose of the container with your regular household trash and waste. It is a good idea to let your trash hauler know that you will be disposing of sharps.  Document Released: 08/07/2003 Document Revised: 04/28/2012 Document Reviewed: 02/05/2009  [employer]® Patient Information ©2014 [employer].

## 2013-08-08 ENCOUNTER — Ambulatory Visit: Payer: Medicaid Other

## 2013-08-09 ENCOUNTER — Ambulatory Visit: Payer: Medicaid Other

## 2014-01-22 DIAGNOSIS — L408 Other psoriasis: Secondary | ICD-10-CM | POA: Diagnosis present

## 2014-01-22 DIAGNOSIS — Z23 Encounter for immunization: Secondary | ICD-10-CM

## 2014-01-22 DIAGNOSIS — E1010 Type 1 diabetes mellitus with ketoacidosis without coma: Principal | ICD-10-CM | POA: Diagnosis present

## 2014-01-22 DIAGNOSIS — E43 Unspecified severe protein-calorie malnutrition: Secondary | ICD-10-CM | POA: Diagnosis present

## 2014-01-22 DIAGNOSIS — IMO0002 Reserved for concepts with insufficient information to code with codable children: Secondary | ICD-10-CM

## 2014-01-22 DIAGNOSIS — F17200 Nicotine dependence, unspecified, uncomplicated: Secondary | ICD-10-CM | POA: Diagnosis present

## 2014-01-22 DIAGNOSIS — J069 Acute upper respiratory infection, unspecified: Secondary | ICD-10-CM | POA: Diagnosis present

## 2014-01-22 DIAGNOSIS — E871 Hypo-osmolality and hyponatremia: Secondary | ICD-10-CM | POA: Diagnosis present

## 2014-01-22 DIAGNOSIS — L259 Unspecified contact dermatitis, unspecified cause: Secondary | ICD-10-CM | POA: Diagnosis present

## 2014-01-22 DIAGNOSIS — E876 Hypokalemia: Secondary | ICD-10-CM | POA: Diagnosis not present

## 2014-01-22 DIAGNOSIS — E875 Hyperkalemia: Secondary | ICD-10-CM | POA: Diagnosis present

## 2014-01-22 DIAGNOSIS — Z794 Long term (current) use of insulin: Secondary | ICD-10-CM

## 2014-01-22 DIAGNOSIS — D72829 Elevated white blood cell count, unspecified: Secondary | ICD-10-CM | POA: Diagnosis present

## 2014-01-23 ENCOUNTER — Inpatient Hospital Stay (HOSPITAL_COMMUNITY): Payer: Medicaid Other

## 2014-01-23 ENCOUNTER — Inpatient Hospital Stay (HOSPITAL_COMMUNITY)
Admission: EM | Admit: 2014-01-23 | Discharge: 2014-01-26 | DRG: 637 | Disposition: A | Discharge status: Home / Self Care | Payer: Medicaid Other | Attending: Internal Medicine | Admitting: Internal Medicine

## 2014-01-23 ENCOUNTER — Encounter (HOSPITAL_COMMUNITY): Payer: Self-pay | Admitting: Emergency Medicine

## 2014-01-23 DIAGNOSIS — L309 Dermatitis, unspecified: Secondary | ICD-10-CM

## 2014-01-23 DIAGNOSIS — E43 Unspecified severe protein-calorie malnutrition: Secondary | ICD-10-CM

## 2014-01-23 DIAGNOSIS — E1010 Type 1 diabetes mellitus with ketoacidosis without coma: Principal | ICD-10-CM

## 2014-01-23 DIAGNOSIS — E118 Type 2 diabetes mellitus with unspecified complications: Secondary | ICD-10-CM

## 2014-01-23 DIAGNOSIS — E1110 Type 2 diabetes mellitus with ketoacidosis without coma: Secondary | ICD-10-CM

## 2014-01-23 DIAGNOSIS — R634 Abnormal weight loss: Secondary | ICD-10-CM

## 2014-01-23 DIAGNOSIS — L409 Psoriasis, unspecified: Secondary | ICD-10-CM

## 2014-01-23 DIAGNOSIS — R7989 Other specified abnormal findings of blood chemistry: Secondary | ICD-10-CM

## 2014-01-23 DIAGNOSIS — E860 Dehydration: Secondary | ICD-10-CM

## 2014-01-23 DIAGNOSIS — E1165 Type 2 diabetes mellitus with hyperglycemia: Secondary | ICD-10-CM

## 2014-01-23 DIAGNOSIS — E876 Hypokalemia: Secondary | ICD-10-CM

## 2014-01-23 DIAGNOSIS — IMO0002 Reserved for concepts with insufficient information to code with codable children: Secondary | ICD-10-CM

## 2014-01-23 LAB — CBC
HCT: 37.8 % — ABNORMAL LOW (ref 39.0–52.0)
Hemoglobin: 12.9 g/dL — ABNORMAL LOW (ref 13.0–17.0)
MCH: 32.3 pg (ref 26.0–34.0)
MCHC: 34.1 g/dL (ref 30.0–36.0)
MCV: 94.7 fL (ref 78.0–100.0)
PLATELETS: 290 10*3/uL (ref 150–400)
RBC: 3.99 MIL/uL — ABNORMAL LOW (ref 4.22–5.81)
RDW: 13.9 % (ref 11.5–15.5)
WBC: 15.4 10*3/uL — ABNORMAL HIGH (ref 4.0–10.5)

## 2014-01-23 LAB — GLUCOSE, CAPILLARY
GLUCOSE-CAPILLARY: 208 mg/dL — AB (ref 70–99)
GLUCOSE-CAPILLARY: 180 mg/dL — AB (ref 70–99)
GLUCOSE-CAPILLARY: 115 mg/dL — AB (ref 70–99)
GLUCOSE-CAPILLARY: 99 mg/dL (ref 70–99)
GLUCOSE-CAPILLARY: 106 mg/dL — AB (ref 70–99)
GLUCOSE-CAPILLARY: 109 mg/dL — AB (ref 70–99)
GLUCOSE-CAPILLARY: 143 mg/dL — AB (ref 70–99)
Glucose-Capillary: 303 mg/dL — ABNORMAL HIGH (ref 70–99)
Glucose-Capillary: 274 mg/dL — ABNORMAL HIGH (ref 70–99)
Glucose-Capillary: 186 mg/dL — ABNORMAL HIGH (ref 70–99)
Glucose-Capillary: 150 mg/dL — ABNORMAL HIGH (ref 70–99)
Glucose-Capillary: 124 mg/dL — ABNORMAL HIGH (ref 70–99)
Glucose-Capillary: 98 mg/dL (ref 70–99)
Glucose-Capillary: 84 mg/dL (ref 70–99)
Glucose-Capillary: 88 mg/dL (ref 70–99)
Glucose-Capillary: 83 mg/dL (ref 70–99)
Glucose-Capillary: 129 mg/dL — ABNORMAL HIGH (ref 70–99)
Glucose-Capillary: 165 mg/dL — ABNORMAL HIGH (ref 70–99)

## 2014-01-23 LAB — I-STAT CHEM 8, ED
BUN: 14 mg/dL (ref 6–23)
CHLORIDE: 107 meq/L (ref 96–112)
Calcium, Ion: 1.06 mmol/L — ABNORMAL LOW (ref 1.12–1.23)
Creatinine, Ser: 1 mg/dL (ref 0.50–1.35)
Glucose, Bld: 471 mg/dL — ABNORMAL HIGH (ref 70–99)
HEMATOCRIT: 53 % — AB (ref 39.0–52.0)
Hemoglobin: 18 g/dL — ABNORMAL HIGH (ref 13.0–17.0)
Potassium: 6.2 mEq/L — ABNORMAL HIGH (ref 3.7–5.3)
Sodium: 135 mEq/L — ABNORMAL LOW (ref 137–147)
TCO2: 8 mmol/L (ref 0–100)

## 2014-01-23 LAB — TROPONIN I
Troponin I: <0.3 ng/mL
Troponin I: <0.3 ng/mL (ref <0.30)
Troponin I: <0.3 ng/mL (ref <0.30)

## 2014-01-23 LAB — BLOOD GAS, ARTERIAL
Acid-base deficit: 26.2 mmol/L — ABNORMAL HIGH (ref 0.0–2.0)
BICARBONATE: 3 meq/L — AB (ref 20.0–24.0)
DRAWN BY: 23404
FIO2: 0.21 %
O2 Saturation: 95.6 %
PATIENT TEMPERATURE: 98.6
PH ART: 7.03 — AB (ref 7.350–7.450)
TCO2: 3.3 mmol/L (ref 0–100)
pCO2 arterial: 11.8 mmHg — CL (ref 35.0–45.0)
pO2, Arterial: 118 mmHg — ABNORMAL HIGH (ref 80.0–100.0)

## 2014-01-23 LAB — COMPREHENSIVE METABOLIC PANEL WITH GFR
ALT: 22 U/L (ref 0–53)
AST: 27 U/L (ref 0–37)
Albumin: 5.1 g/dL (ref 3.5–5.2)
Alkaline Phosphatase: 140 U/L — ABNORMAL HIGH (ref 39–117)
BUN: 14 mg/dL (ref 6–23)
CO2: <7 meq/L — CL (ref 19–32)
Calcium: 9.7 mg/dL (ref 8.4–10.5)
Chloride: 85 meq/L — ABNORMAL LOW (ref 96–112)
Creatinine, Ser: 1.01 mg/dL (ref 0.50–1.35)
GFR calc Af Amer: >90 mL/min
GFR calc non Af Amer: >90 mL/min
Glucose, Bld: 507 mg/dL — ABNORMAL HIGH (ref 70–99)
Potassium: 5.9 meq/L — ABNORMAL HIGH (ref 3.7–5.3)
Sodium: 135 meq/L — ABNORMAL LOW (ref 137–147)
Total Bilirubin: 0.4 mg/dL (ref 0.3–1.2)
Total Protein: 9.3 g/dL — ABNORMAL HIGH (ref 6.0–8.3)

## 2014-01-23 LAB — BASIC METABOLIC PANEL
BUN: 14 mg/dL (ref 6–23)
BUN: 11 mg/dL (ref 6–23)
BUN: 10 mg/dL (ref 6–23)
BUN: 8 mg/dL (ref 6–23)
BUN: 8 mg/dL (ref 6–23)
BUN: 7 mg/dL (ref 6–23)
CALCIUM: 8.8 mg/dL (ref 8.4–10.5)
CALCIUM: 8.8 mg/dL (ref 8.4–10.5)
CALCIUM: 8.8 mg/dL (ref 8.4–10.5)
CALCIUM: 8.7 mg/dL (ref 8.4–10.5)
CHLORIDE: 103 meq/L (ref 96–112)
CO2: <7 mEq/L — CL (ref 19–32)
CO2: 8 meq/L — AB (ref 19–32)
CO2: 11 mEq/L — ABNORMAL LOW (ref 19–32)
CO2: 15 mEq/L — ABNORMAL LOW (ref 19–32)
CO2: 16 meq/L — AB (ref 19–32)
CO2: 17 mEq/L — ABNORMAL LOW (ref 19–32)
CREATININE: 0.9 mg/dL (ref 0.50–1.35)
CREATININE: 0.79 mg/dL (ref 0.50–1.35)
CREATININE: 0.78 mg/dL (ref 0.50–1.35)
CREATININE: 0.71 mg/dL (ref 0.50–1.35)
CREATININE: 0.67 mg/dL (ref 0.50–1.35)
Calcium: 8 mg/dL — ABNORMAL LOW (ref 8.4–10.5)
Calcium: 8.2 mg/dL — ABNORMAL LOW (ref 8.4–10.5)
Chloride: 92 mEq/L — ABNORMAL LOW (ref 96–112)
Chloride: 98 mEq/L (ref 96–112)
Chloride: 100 mEq/L (ref 96–112)
Chloride: 105 mEq/L (ref 96–112)
Chloride: 105 mEq/L (ref 96–112)
Creatinine, Ser: 0.61 mg/dL (ref 0.50–1.35)
GFR calc Af Amer: >90 mL/min (ref >90)
GFR calc Af Amer: >90 mL/min (ref >90)
GFR calc Af Amer: >90 mL/min (ref >90)
GFR calc Af Amer: >90 mL/min (ref >90)
GFR calc non Af Amer: >90 mL/min (ref >90)
GFR calc non Af Amer: >90 mL/min (ref >90)
GFR calc non Af Amer: >90 mL/min (ref >90)
GFR calc non Af Amer: >90 mL/min (ref >90)
GFR calc non Af Amer: >90 mL/min (ref >90)
GLUCOSE: 223 mg/dL — AB (ref 70–99)
Glucose, Bld: 461 mg/dL — ABNORMAL HIGH (ref 70–99)
Glucose, Bld: 325 mg/dL — ABNORMAL HIGH (ref 70–99)
Glucose, Bld: 189 mg/dL — ABNORMAL HIGH (ref 70–99)
Glucose, Bld: 158 mg/dL — ABNORMAL HIGH (ref 70–99)
Glucose, Bld: 98 mg/dL (ref 70–99)
Potassium: 6.8 mEq/L (ref 3.7–5.3)
Potassium: 5.5 mEq/L — ABNORMAL HIGH (ref 3.7–5.3)
Potassium: 4.9 mEq/L (ref 3.7–5.3)
Potassium: 4.6 mEq/L (ref 3.7–5.3)
Potassium: 4.1 mEq/L (ref 3.7–5.3)
Potassium: 3.9 mEq/L (ref 3.7–5.3)
Sodium: 136 mEq/L — ABNORMAL LOW (ref 137–147)
Sodium: 135 mEq/L — ABNORMAL LOW (ref 137–147)
Sodium: 135 mEq/L — ABNORMAL LOW (ref 137–147)
Sodium: 137 mEq/L (ref 137–147)
Sodium: 138 mEq/L (ref 137–147)
Sodium: 138 mEq/L (ref 137–147)

## 2014-01-23 LAB — URINALYSIS, ROUTINE W REFLEX MICROSCOPIC
Bilirubin Urine: NEGATIVE
Glucose, UA: >1000 mg/dL — AB
Ketones, ur: >80 mg/dL — AB
Leukocytes, UA: NEGATIVE
Nitrite: NEGATIVE
Protein, ur: 30 mg/dL — AB
Specific Gravity, Urine: 1.025 (ref 1.005–1.030)
Urobilinogen, UA: 0.2 mg/dL (ref 0.0–1.0)
pH: 5 (ref 5.0–8.0)

## 2014-01-23 LAB — CBC WITH DIFFERENTIAL/PLATELET
BASOS ABS: 0 10*3/uL (ref 0.0–0.1)
Basophils Relative: 0 % (ref 0–1)
Eosinophils Absolute: 0 10*3/uL (ref 0.0–0.7)
Eosinophils Relative: 0 % (ref 0–5)
HCT: 47.3 % (ref 39.0–52.0)
Hemoglobin: 16.4 g/dL (ref 13.0–17.0)
LYMPHS PCT: 5 % — AB (ref 12–46)
Lymphs Abs: 0.7 10*3/uL (ref 0.7–4.0)
MCH: 33.5 pg (ref 26.0–34.0)
MCHC: 34.7 g/dL (ref 30.0–36.0)
MCV: 96.7 fL (ref 78.0–100.0)
Monocytes Absolute: 0.9 10*3/uL (ref 0.1–1.0)
Monocytes Relative: 6 % (ref 3–12)
Neutro Abs: 12.9 10*3/uL — ABNORMAL HIGH (ref 1.7–7.7)
Neutrophils Relative %: 89 % — ABNORMAL HIGH (ref 43–77)
PLATELETS: 355 10*3/uL (ref 150–400)
RBC: 4.89 MIL/uL (ref 4.22–5.81)
RDW: 13.8 % (ref 11.5–15.5)
WBC: 14.5 10*3/uL — AB (ref 4.0–10.5)

## 2014-01-23 LAB — HEMOGLOBIN A1C
Hgb A1c MFr Bld: 12.3 % — ABNORMAL HIGH (ref <5.7)
Mean Plasma Glucose: 306 mg/dL — ABNORMAL HIGH (ref <117)

## 2014-01-23 LAB — CBG MONITORING, ED
GLUCOSE-CAPILLARY: 489 mg/dL — AB (ref 70–99)
GLUCOSE-CAPILLARY: 372 mg/dL — AB (ref 70–99)
GLUCOSE-CAPILLARY: 266 mg/dL — AB (ref 70–99)
GLUCOSE-CAPILLARY: 274 mg/dL — AB (ref 70–99)
Glucose-Capillary: 439 mg/dL — ABNORMAL HIGH (ref 70–99)

## 2014-01-23 LAB — URINE MICROSCOPIC-ADD ON

## 2014-01-23 LAB — HIV ANTIBODY (ROUTINE TESTING W REFLEX): HIV: NONREACTIVE

## 2014-01-23 LAB — LIPASE, BLOOD: Lipase: 16 U/L (ref 11–59)

## 2014-01-23 LAB — MRSA PCR SCREENING: MRSA by PCR: NEGATIVE

## 2014-01-23 LAB — PREALBUMIN: Prealbumin: 8.2 mg/dL — ABNORMAL LOW (ref 17.0–34.0)

## 2014-01-23 LAB — LACTIC ACID, PLASMA: Lactic Acid, Venous: 1.7 mmol/L (ref 0.5–2.2)

## 2014-01-23 LAB — I-STAT CG4 LACTIC ACID, ED: Lactic Acid, Venous: 3.16 mmol/L — ABNORMAL HIGH (ref 0.5–2.2)

## 2014-01-23 LAB — RAPID STREP SCREEN (MED CTR MEBANE ONLY): STREPTOCOCCUS, GROUP A SCREEN (DIRECT): NEGATIVE

## 2014-01-23 MED ORDER — INSULIN REGULAR HUMAN 100 UNIT/ML IJ SOLN
INTRAMUSCULAR | Status: DC
  Administered 2014-01-23: 4.3 [IU]/h via INTRAVENOUS
  Filled 2014-01-23: qty 1

## 2014-01-23 MED ORDER — MORPHINE SULFATE 4 MG/ML IJ SOLN
4.0000 mg | Freq: Once | INTRAMUSCULAR | Status: AC
  Administered 2014-01-23: 4 mg via INTRAVENOUS
  Filled 2014-01-23: qty 1

## 2014-01-23 MED ORDER — SODIUM CHLORIDE 0.9 % IV SOLN
1000.0000 mL | Freq: Once | INTRAVENOUS | Status: AC
  Administered 2014-01-23: 1000 mL via INTRAVENOUS

## 2014-01-23 MED ORDER — DEXTROSE 50 % IV SOLN
25.0000 mL | INTRAVENOUS | Status: DC | PRN
  Administered 2014-01-24: 21 mL via INTRAVENOUS
  Filled 2014-01-23: qty 50

## 2014-01-23 MED ORDER — ENOXAPARIN SODIUM 40 MG/0.4ML ~~LOC~~ SOLN
40.0000 mg | SUBCUTANEOUS | Status: DC
  Administered 2014-01-24 – 2014-01-26 (×3): 40 mg via SUBCUTANEOUS
  Filled 2014-01-23 (×5): qty 0.4

## 2014-01-23 MED ORDER — SODIUM CHLORIDE 0.9 % IV SOLN
1000.0000 mL | INTRAVENOUS | Status: DC
  Administered 2014-01-23: 1000 mL via INTRAVENOUS

## 2014-01-23 MED ORDER — SODIUM CHLORIDE 0.9 % IV SOLN: INTRAVENOUS | Status: AC

## 2014-01-23 MED ORDER — SODIUM POLYSTYRENE SULFONATE 15 GM/60ML PO SUSP: 30.0000 g | Freq: Once | ORAL | Status: DC

## 2014-01-23 MED ORDER — SODIUM CHLORIDE 0.9 % IV SOLN
INTRAVENOUS | Status: DC
  Administered 2014-01-23: 0.8 [IU]/h via INTRAVENOUS
  Administered 2014-01-24: 2.2 [IU]/h via INTRAVENOUS
  Administered 2014-01-24: 5 [IU]/h via INTRAVENOUS
  Administered 2014-01-24: 1.4 [IU]/h via INTRAVENOUS
  Administered 2014-01-24: 3.7 [IU]/h via INTRAVENOUS
  Administered 2014-01-24: 2.2 [IU]/h via INTRAVENOUS
  Administered 2014-01-24: 5.1 [IU]/h via INTRAVENOUS
  Administered 2014-01-24: 0.2 [IU]/h via INTRAVENOUS
  Filled 2014-01-23: qty 1

## 2014-01-23 MED ORDER — SODIUM CHLORIDE 0.9 % IV SOLN
INTRAVENOUS | Status: DC
  Administered 2014-01-23: 08:00:00 via INTRAVENOUS

## 2014-01-23 MED ORDER — DEXTROSE-NACL 5-0.45 % IV SOLN
INTRAVENOUS | Status: DC
  Administered 2014-01-23 – 2014-01-24 (×2): via INTRAVENOUS

## 2014-01-23 MED ORDER — ONDANSETRON HCL 4 MG/2ML IJ SOLN
4.0000 mg | Freq: Once | INTRAMUSCULAR | Status: AC
  Administered 2014-01-23: 4 mg via INTRAVENOUS
  Filled 2014-01-23: qty 2

## 2014-01-23 MED ORDER — SODIUM CHLORIDE 0.9 % IV BOLUS (SEPSIS)
1000.0000 mL | Freq: Once | INTRAVENOUS | Status: AC
  Administered 2014-01-23: 1000 mL via INTRAVENOUS

## 2014-01-23 MED ORDER — SODIUM BICARBONATE 4.2 % IV SOLN: 50.0000 meq | Freq: Once | INTRAVENOUS | Status: DC

## 2014-01-23 MED ORDER — SODIUM CHLORIDE 0.9 % IV SOLN
1.0000 g | Freq: Once | INTRAVENOUS | Status: AC
  Administered 2014-01-23: 1 g via INTRAVENOUS
  Filled 2014-01-23: qty 10

## 2014-01-23 NOTE — Progress Notes (Signed)
Critical value called CO2 8 (expected value). DKA Protocol.

## 2014-01-23 NOTE — Progress Notes (Signed)
Transferred to 2C09 via wheelchair. Portable monitor on. No changes.

## 2014-01-23 NOTE — H&P (Addendum)
PCP: none   Chief Complaint:  Nausea, vomiting, abdominal pain  HPI: Jared Murray is a 38 y.o. male   has a past medical history of Eczema; Type 1 diabetes mellitus; and Psoriasis.   Presented with  3 days of chills cough nausea and vomiting. His daughter got sick recently and he believes he got a Gi bug from her. Patient have had some chest discomfort while vomiting but not now. Reports sore throat. Fatigue.  His blood sugar continued to rize. He called EMS and was brought to ER. ABG 7.03/11.8/ 118  Hospitalist was called for admission for Severe DKA Review of Systems:    Pertinent positives include: chills, fatigue,Sore throat,nasal congestion, post nasal drip, abdominal pain, nausea, vomiting, chest pain,  Constitutional:  No weight loss, night sweats, Fevers,  weight loss  HEENT:  No headaches, Difficulty swallowing,Tooth/dental problems  No sneezing, itching, ear ache,  Cardio-vascular:  No  Orthopnea, PND, anasarca, dizziness, palpitations.no Bilateral lower extremity swelling  GI:  No heartburn, indigestion,  diarrhea, change in bowel habits, loss of appetite, melena, blood in stool, hematemesis Resp:  no shortness of breath at rest. No dyspnea on exertion, No excess mucus, no productive cough, No non-productive cough, No coughing up of blood.No change in color of mucus.No wheezing. Skin:  no rash or lesions. No jaundice GU:  no dysuria, change in color of urine, no urgency or frequency. No straining to urinate.  No flank pain.  Musculoskeletal:  No joint pain or no joint swelling. No decreased range of motion. No back pain.  Psych:  No change in mood or affect. No depression or anxiety. No memory loss.  Neuro: no localizing neurological complaints, no tingling, no weakness, no double vision, no gait abnormality, no slurred speech, no confusion  Otherwise ROS are negative except for above, 10 systems were reviewed  Past Medical History: Past Medical History    Diagnosis Date  . Eczema   . Type 1 diabetes mellitus   . Psoriasis    Past Surgical History  Procedure Laterality Date  . Finger surgery       Medications: Prior to Admission medications   Medication Sig Start Date End Date Taking? Authorizing Provider  insulin glargine (LANTUS) 100 UNIT/ML injection Inject 25 Units into the skin daily.   Yes Historical Provider, MD  NOVOLOG 100 UNIT/ML injection Sliding scale CBG 70 - 120: 0 units CBG 121 - 150: 1 unit,  CBG 151 - 200: 2 units,  CBG 201 - 250: 3 units,  CBG 251 - 300: 5 units,  CBG 301 - 350: 7 units,  CBG 351 - 400: 9 units   CBG > 400: 10 units 06/15/13  Yes Ripudeep Krystal Eaton, MD    Allergies:   Allergies  Allergen Reactions  . Penicillins     Childhood allergy    Social History:  Ambulatory  independently   Lives at home  With family     reports that he has been smoking.  He does not have any smokeless tobacco history on file. He reports that he drinks alcohol. He reports that he uses illicit drugs (Marijuana).    Family History: family history includes Diabetes in an other family member; Hypertension in his father; Lupus in his cousin; Stroke in his maternal grandmother and paternal grandmother.    Physical Exam: Patient Vitals for the past 24 hrs:  BP Temp Temp src Pulse Resp SpO2 Height Weight  01/23/14 0300 106/87 mmHg - - 109 28 100 % - -  01/23/14 0245 154/96 mmHg - - 99 24 99 % - -  01/23/14 0230 152/93 mmHg - - 104 25 100 % - -  01/23/14 0215 153/86 mmHg - - 96 28 100 % - -  01/23/14 0115 156/92 mmHg - - 106 23 99 % - -  01/23/14 0100 146/90 mmHg - - 98 27 100 % - -  01/23/14 0017 131/83 mmHg 97.6 F (36.4 C) Oral 119 22 98 % 5\' 10"  (1.778 m) 65.772 kg (145 lb)    1. General:  in No Acute distress 2. Psychological: Alert and  Oriented 3. Head/ENT:    Dry Mucous Membranes                          Head Non traumatic, neck supple                          Normal Dentition 4. SKIN:   decreased Skin  turgor,  Skin clean Dry with numerous excoriations and psoriasis plaques 5. Heart: rapid Regular rate and rhythm no Murmur, Rub or gallop 6. Lungs: Clear to auscultation bilaterally, no wheezes or crackles   7. Abdomen: Soft, epigastric tenderness, Non distended 8. Lower extremities: no clubbing, cyanosis, or edema 9. Neurologically Grossly intact, moving all 4 extremities equally 10. MSK: Normal range of motion  body mass index is 20.81 kg/(m^2).   Labs on Admission:   Recent Labs  01/23/14 0045 01/23/14 0320  NA 135* 135*  K 5.9* 6.2*  CL 85* 107  CO2 <7*  --   GLUCOSE 507* 471*  BUN 14 14  CREATININE 1.01 1.00  CALCIUM 9.7  --     Recent Labs  01/23/14 0045  AST 27  ALT 22  ALKPHOS 140*  BILITOT 0.4  PROT 9.3*  ALBUMIN 5.1    Recent Labs  01/23/14 0045  LIPASE 16    Recent Labs  01/23/14 0045 01/23/14 0320  WBC 14.5*  --   NEUTROABS 12.9*  --   HGB 16.4 18.0*  HCT 47.3 53.0*  MCV 96.7  --   PLT 355  --     Recent Labs  01/23/14 0045  TROPONINI <0.30   No results found for this basename: TSH, T4TOTAL, FREET3, T3FREE, THYROIDAB,  in the last 72 hours No results found for this basename: VITAMINB12, FOLATE, FERRITIN, TIBC, IRON, RETICCTPCT,  in the last 72 hours Lab Results  Component Value Date   HGBA1C 11.5 06/15/2013    Estimated Creatinine Clearance: 94.1 ml/min (by C-G formula based on Cr of 1). ABG    Component Value Date/Time   PHART 7.030* 01/23/2014 0220   HCO3 3.0* 01/23/2014 0220   TCO2 8 01/23/2014 0320   ACIDBASEDEF 26.2* 01/23/2014 0220   O2SAT 95.6 01/23/2014 0220     No results found for this basename: DDIMER     Other results:  I have pearsonaly reviewed this: ECG REPORT  Rate: 97  Rhythm: RBBB ST&T Change: no ischemia    BNP (last 3 results) No results found for this basename: PROBNP,  in the last 8760 hours  Filed Weights   01/23/14 0017  Weight: 65.772 kg (145 lb)     Cultures: No results found for this  basename: sdes, specrequest, cult, reptstatus         Radiological Exams on Admission: No results found.  Chart has been reviewed  Assessment/Plan 38 yo M with hxof DM1 with  severe DKA in the setting of possibly viral URI, AG 43  Present on Admission:  . DKA, type 1, not at goal - admit to stepdown, IVF then convert to D5 1/2NS when BG <250, monitor labs Cycle CE, eval for sourse of infection, obtain uA and CXR, rapid strep . Protein-calorie malnutrition, severe - check prealbumin, NPO until nausea improves . Dehydration - agressive IVF rehydration Hyperkalemia - with some ECG changes, potasium is trending up despite adequate treatment of DKA with aggressive IVF and insulin drip, suspect hemolyses but will treat with kayexalate, Ca and bicarb will continue to follow and repeat ECG, monitor on tele  Prophylaxis: Lovenox,   CODE STATUS:  FULL CODE  Other plan as per orders.  I have spent a total of 55 min on this admission  Jared Murray 01/23/2014, 4:14 AM  [facility]  Pager [phone]   If 7AM-7PM, please contact the day team taking care of the patient  [email or web address]  Password [email or web address]

## 2014-01-23 NOTE — ED Notes (Signed)
Pt's blood sugar is 372.

## 2014-01-23 NOTE — ED Notes (Signed)
Dr Glick given a copy of chem 8 results  

## 2014-01-23 NOTE — Progress Notes (Signed)
Inpatient Diabetes Program Recommendations  AACE/ADA: New Consensus Statement on Inpatient Glycemic Control (2013)  Target Ranges:  Prepandial:   less than 140 mg/dL      Peak postprandial:   less than 180 mg/dL (1-2 hours)      Critically ill patients:  140 - 180 mg/dL   Note: Request A1C Thank you.  Jared Murray S. Marcelline Mates, RN, CNS, CDE Inpatient Diabetes Program, team pager [phone]

## 2014-01-23 NOTE — Progress Notes (Signed)
INITIAL NUTRITION ASSESSMENT  DOCUMENTATION CODES Per approved criteria  -Not Applicable   INTERVENTION: Advance diet as medically appropriate, add supplements accordingly RD to follow for nutrition care plan  NUTRITION DIAGNOSIS: Inadequate oral intake related to inability to eat as evidenced by NPO status  Goal: Pt to meet >/= 90% of their estimated nutrition needs   Monitor:  PO diet advancement & intake, weight, labs, I/O's  Reason for Assessment: Malnutrition Screening Tool Report  38 y.o. male  Admitting Dx: vomiting, nausea, abdominal pain  ASSESSMENT: Patient with PMH of eczema,Type 1 diabetes mellitus and psoriasis; presented with 3 days hx of chills, cough, N/V; his daughter got sick recently and he believes he got a GI bug from her; blood sugar continued to rise; called EMS and was brought to the ER.  RD unable to obtain much nutrition hx at this time; pt not feeling well and currently in SICU; known to Clinical Nutrition during previous hospitalizations; pt reports he's been eating poorly for ~ 2 days PTA; per previous nutrition assessment, patient had lost significant amount of weight over the past year then gained 15 lbs; RD to monitor diet advancement & intake and add supplements as needed.  Nutrition Focused Physical Exam:   Subcutaneous Fat:  Orbital Region: WNL  Upper Arm Region: WNL  Thoracic and Lumbar Region: WNL   Muscle:  Temple Region: mild-moderate depletion  Clavicle Bone Region: mild-moderate depletion  Clavicle and Acromion Bone Region: mild-moderate depletion  Scapular Bone Region: WNL  Dorsal Hand: WNL  Patellar Region: WNL  Anterior Thigh Region: WNL  Posterior Calf Region: WNL  Edema: none  Height: Ht Readings from Last 1 Encounters:  01/23/14 5\' 10"  (1.778 m)    Weight: Wt Readings from Last 1 Encounters:  01/23/14 145 lb (65.772 kg)    Ideal Body Weight: 166 lb  % Ideal Body Weight: 87%  Wt Readings from Last 20  Encounters:  01/23/14 145 lb (65.772 kg)  07/02/13 130 lb (58.968 kg)  07/01/13 131 lb (59.421 kg)  03/16/13 116 lb 4 oz (52.731 kg)    Usual Body Weight: 150 lb  % Usual Body Weight: 96%  BMI:  Body mass index is 20.81 kg/(m^2).  Estimated Nutritional Needs: Kcal: 2000-2200 Protein: 100-110 gm Fluid: 2.0-2.2 L  Skin: Intact  Diet Order: NPO  EDUCATION NEEDS: -No education needs identified at this time   Intake/Output Summary (Last 24 hours) at 01/23/14 1229 Last data filed at 01/23/14 1200  Gross per 24 hour  Intake 4424.8 ml  Output   1300 ml  Net 3124.8 ml    Labs:   Recent Labs Lab 01/23/14 0703 01/23/14 0845 01/23/14 1045  NA 135* 135* 137  K 5.5* 4.9 4.6  CL 98 100 103  CO2 8* 11* 15*  BUN 11 10 8   CREATININE 0.79 0.78 0.71  CALCIUM 8.0* 8.2* 8.8  GLUCOSE 325* 223* 189*    CBG (last 3)   Recent Labs  01/23/14 0504 01/23/14 0528 01/23/14 0748  GLUCAP 266* 274* 274*    Scheduled Meds: . enoxaparin (LOVENOX) injection  40 mg Subcutaneous Q24H  . sodium bicarbonate  50 mEq Intravenous Once  . sodium polystyrene  30 g Oral Once    Continuous Infusions: . sodium chloride Stopped (01/23/14 0538)  . sodium chloride 250 mL/hr at 01/23/14 0805  . dextrose 5 % and 0.45% NaCl 125 mL/hr at 01/23/14 1200  . insulin (NOVOLIN-R) infusion 2.1 Units/hr (01/23/14 0538)  . insulin (NOVOLIN-R) infusion 7.2  Units/hr (01/23/14 1100)    Past Medical History  Diagnosis Date  . Eczema   . Type 1 diabetes mellitus   . Psoriasis     Past Surgical History  Procedure Laterality Date  . Finger surgery      Arthur Holms, RD, LDN Pager #: [phone] After-Hours Pager #: [phone]

## 2014-01-23 NOTE — ED Provider Notes (Signed)
CSN: [id number]     Arrival date & time 01/22/14  2359 History   First MD Initiated Contact with Patient 01/23/14 0149     Chief Complaint  Patient presents with  . Hyperglycemia     (Consider location/radiation/quality/duration/timing/severity/associated sxs/prior Treatment) Patient is a 38 y.o. male presenting with hyperglycemia. The history is provided by the patient.  Hyperglycemia He states that he started having nausea and vomiting in the morning of June 7 and developed crampy periumbilical pain in the evening. Pain does radiate through to the back. Pain was initially a 7/10 but has worsened from prior and is 10/10. It is worse when he vomits. There's been no constipation diarrhea. He denies fever, chills, sweats. There has been some urinary frequency. Blood sugar was elevated at 485 tonight. He denies chest pain. He denies fever, chills, sweats. There was some mild dyspnea earlier in the day.  Past Medical History  Diagnosis Date  . Eczema   . Type 1 diabetes mellitus   . Psoriasis    Past Surgical History  Procedure Laterality Date  . Finger surgery     Family History  Problem Relation Age of Onset  . Hypertension Father   . Diabetes      multiple  . Lupus Cousin   . Stroke Maternal Grandmother   . Stroke Paternal Grandmother    History  Substance Use Topics  . Smoking status: Current Every Day Smoker  . Smokeless tobacco: Not on file  . Alcohol Use: Yes     Comment: recently quit - 7/14     Review of Systems  All other systems reviewed and are negative.     Allergies  Penicillins  Home Medications   Prior to Admission medications   Medication Sig Start Date End Date Taking? Authorizing Provider  insulin glargine (LANTUS) 100 UNIT/ML injection Inject 25 Units into the skin daily.   Yes Historical Provider, MD  NOVOLOG 100 UNIT/ML injection Sliding scale CBG 70 - 120: 0 units CBG 121 - 150: 1 unit,  CBG 151 - 200: 2 units,  CBG 201 - 250: 3 units,  CBG 251  - 300: 5 units,  CBG 301 - 350: 7 units,  CBG 351 - 400: 9 units   CBG > 400: 10 units 06/15/13  Yes Ripudeep K Rai, MD   BP 156/92  Pulse 106  Temp(Src) 97.6 F (36.4 C) (Oral)  Resp 23  Ht 5\' 10"  (1.778 m)  Wt 145 lb (65.772 kg)  BMI 20.81 kg/m2  SpO2 99% Physical Exam  Nursing note and vitals reviewed.  38 year old male, who appears uncomfortable and has Kussmaul respirations, but is in no acute distress. Vital signs are significant for tachypnea with respiratory rate of 27, no tachycardia with heart rate 119, and hypertension with blood pressure 156/92. Oxygen saturation is 98%, which is normal. Head is normocephalic and atraumatic. PERRLA, EOMI. Oropharynx is clear. Neck is nontender and supple without adenopathy or JVD. Back is nontender and there is no CVA tenderness. Lungs are clear without rales, wheezes, or rhonchi. Chest is nontender. Heart has regular rate and rhythm without murmur. Abdomen is soft, flat, with mild periumbilical and epigastric tenderness. There is no rebound or guarding. There are no masses or hepatosplenomegaly and peristalsis is hypoactive. Extremities have no cyanosis or edema, full range of motion is present. Skin is warm and dry. Lesions of eczema are present in all extremities. Neurologic: Mental status is normal, cranial nerves are intact, there are no  motor or sensory deficits.  ED Course  Procedures (including critical care time) Labs Review Results for orders placed during the hospital encounter of 01/23/14  CBC WITH DIFFERENTIAL      Result Value Ref Range   WBC 14.5 (*) 4.0 - 10.5 K/uL   RBC 4.89  4.22 - 5.81 MIL/uL   Hemoglobin 16.4  13.0 - 17.0 g/dL   HCT 47.3  39.0 - 52.0 %   MCV 96.7  78.0 - 100.0 fL   MCH 33.5  26.0 - 34.0 pg   MCHC 34.7  30.0 - 36.0 g/dL   RDW 13.8  11.5 - 15.5 %   Platelets 355  150 - 400 K/uL   Neutrophils Relative % 89 (*) 43 - 77 %   Lymphocytes Relative 5 (*) 12 - 46 %   Monocytes Relative 6  3 - 12 %    Eosinophils Relative 0  0 - 5 %   Basophils Relative 0  0 - 1 %   Neutro Abs 12.9 (*) 1.7 - 7.7 K/uL   Lymphs Abs 0.7  0.7 - 4.0 K/uL   Monocytes Absolute 0.9  0.1 - 1.0 K/uL   Eosinophils Absolute 0.0  0.0 - 0.7 K/uL   Basophils Absolute 0.0  0.0 - 0.1 K/uL   Smear Review PLATELET CLUMPS NOTED ON SMEAR    COMPREHENSIVE METABOLIC PANEL      Result Value Ref Range   Sodium 135 (*) 137 - 147 mEq/L   Potassium 5.9 (*) 3.7 - 5.3 mEq/L   Chloride 85 (*) 96 - 112 mEq/L   CO2 <7 (*) 19 - 32 mEq/L   Glucose, Bld 507 (*) 70 - 99 mg/dL   BUN 14  6 - 23 mg/dL   Creatinine, Ser 1.01  0.50 - 1.35 mg/dL   Calcium 9.7  8.4 - 10.5 mg/dL   Total Protein 9.3 (*) 6.0 - 8.3 g/dL   Albumin 5.1  3.5 - 5.2 g/dL   AST 27  0 - 37 U/L   ALT 22  0 - 53 U/L   Alkaline Phosphatase 140 (*) 39 - 117 U/L   Total Bilirubin 0.4  0.3 - 1.2 mg/dL   GFR calc non Af Amer >90  >90 mL/min   GFR calc Af Amer >90  >90 mL/min  LIPASE, BLOOD      Result Value Ref Range   Lipase 16  11 - 59 U/L  TROPONIN I      Result Value Ref Range   Troponin I <0.30  <0.30 ng/mL  BLOOD GAS, ARTERIAL      Result Value Ref Range   FIO2 0.21     pH, Arterial 7.030 (*) 7.350 - 7.450   pCO2 arterial 11.8 (*) 35.0 - 45.0 mmHg   pO2, Arterial 118.0 (*) 80.0 - 100.0 mmHg   Bicarbonate 3.0 (*) 20.0 - 24.0 mEq/L   TCO2 3.3  0 - 100 mmol/L   Acid-base deficit 26.2 (*) 0.0 - 2.0 mmol/L   O2 Saturation 95.6     Patient temperature 98.6     Collection site BRACHIAL ARTERY     Drawn by [id number]     Sample type ARTERIAL DRAW     Allens test (pass/fail) PASS  PASS  CBG MONITORING, ED      Result Value Ref Range   Glucose-Capillary 439 (*) 70 - 99 mg/dL  CBG MONITORING, ED      Result Value Ref Range   Glucose-Capillary 489 (*) 70 -  99 mg/dL  I-STAT CG4 LACTIC ACID, ED      Result Value Ref Range   Lactic Acid, Venous 3.16 (*) 0.5 - 2.2 mmol/L  I-STAT CHEM 8, ED      Result Value Ref Range   Sodium 135 (*) 137 - 147 mEq/L   Potassium  6.2 (*) 3.7 - 5.3 mEq/L   Chloride 107  96 - 112 mEq/L   BUN 14  6 - 23 mg/dL   Creatinine, Ser 1.00  0.50 - 1.35 mg/dL   Glucose, Bld 471 (*) 70 - 99 mg/dL   Calcium, Ion 1.06 (*) 1.12 - 1.23 mmol/L   TCO2 8  0 - 100 mmol/L   Hemoglobin 18.0 (*) 13.0 - 17.0 g/dL   HCT 53.0 (*) 39.0 - 52.0 %   Comment NOTIFIED PHYSICIAN     EKG Interpretation   Date/Time:  Monday January 23 2014 01:01:35 EDT Ventricular Rate:  97 PR Interval:  142 QRS Duration: 133 QT Interval:  429 QTC Calculation: 545 R Axis:   95 Text Interpretation:  Sinus rhythm Right atrial enlargement Right bundle  branch block When compared with ECG of 07/02/2013, No significant change  was found Confirmed by [facility]  MD, Clay Menser ([id number]) on 01/23/2014 1:15:22 AM     CRITICAL CARE Performed by: Delora Fuel Total critical care time: 80 minutes Critical care time was exclusive of separately billable procedures and treating other patients. Critical care was necessary to treat or prevent imminent or life-threatening deterioration. Critical care was time spent personally by me on the following activities: development of treatment plan with patient and/or surrogate as well as nursing, discussions with consultants, evaluation of patient's response to treatment, examination of patient, obtaining history from patient or surrogate, ordering and performing treatments and interventions, ordering and review of laboratory studies, ordering and review of radiographic studies, pulse oximetry and re-evaluation of patient's condition.  MDM   Final diagnoses:  DKA (diabetic ketoacidoses)  Elevated lactic acid level    Hyperglycemia with probable ketoacidosis. ABG will be checked as well I take acid. CBG was 439 year. He is given IV fluids and started on glucose stabilizer. Old records are reviewed and he does have a prior hospitalization for ketoacidosis.  Laboratory workup confirms ketoacidosis with significant acidosis. Lactic acid is also  mildly elevated. He is started on aggressive hydration with intravenous fluid boluses and also started on intravenous insulin. Mild hyperkalemia is present consistent with degree of acidosis. Mild hyponatremia is present consistent with degree of hyperglycemia. Case is discussed with Dr. Roel Cluck of [facility] who agrees to admit the patient.  Delora Fuel, MD 23/55/73 2202

## 2014-01-23 NOTE — ED Notes (Signed)
Doutova, MD aware of the pt's critical potassium and CO2 levels. Per Dr. Roel Cluck, hold dextrose in lieu of dextrose, and give the pt crackers and a regular ginger ale.

## 2014-01-23 NOTE — ED Notes (Signed)
Cbg.Marland KitchenMarland KitchenDeForest

## 2014-01-23 NOTE — ED Notes (Signed)
Roxanne Mins, MD aware of the pt's critical CO2 level.

## 2014-01-23 NOTE — ED Notes (Signed)
Doutova, MD at bedside 

## 2014-01-23 NOTE — ED Notes (Signed)
Pt's heart monitor shows ST wave elevation. Upon review of an old EKG, this is normal for the pt. New and Old  EKG given to Roxanne Mins, MD.

## 2014-01-23 NOTE — Progress Notes (Signed)
Patient admitted after midnight for DKA- please see H&P CO1 improving -check HgbA1C -diabetic coordinator following -will continue to monitor for gap closure -will need more education  Eulogio Bear DO

## 2014-01-23 NOTE — ED Notes (Signed)
Pt. Arrived with EMS from home reports elevated blood sugar 485 this evening with nausea /vomitting and generalized abdominal pain . Pt. Received Zofran 4 mg IV by EMS with no relief. 20g. IV saline lock at left ac.

## 2014-01-23 NOTE — ED Notes (Signed)
With pt's verbal permission, this RN spoke with the pt's sister and updated her on the pt's condition.

## 2014-01-23 NOTE — ED Notes (Signed)
Attempted report 

## 2014-01-24 DIAGNOSIS — L259 Unspecified contact dermatitis, unspecified cause: Secondary | ICD-10-CM

## 2014-01-24 LAB — BASIC METABOLIC PANEL
BUN: 4 mg/dL — AB (ref 6–23)
BUN: 4 mg/dL — ABNORMAL LOW (ref 6–23)
BUN: 4 mg/dL — ABNORMAL LOW (ref 6–23)
BUN: 4 mg/dL — ABNORMAL LOW (ref 6–23)
BUN: 5 mg/dL — AB (ref 6–23)
CALCIUM: 8.8 mg/dL (ref 8.4–10.5)
CALCIUM: 8.6 mg/dL (ref 8.4–10.5)
CHLORIDE: 101 meq/L (ref 96–112)
CHLORIDE: 102 meq/L (ref 96–112)
CHLORIDE: 99 meq/L (ref 96–112)
CO2: 16 meq/L — AB (ref 19–32)
CO2: 20 mEq/L (ref 19–32)
CO2: 20 mEq/L (ref 19–32)
CO2: 20 mEq/L (ref 19–32)
CO2: 20 meq/L (ref 19–32)
CREATININE: 0.53 mg/dL (ref 0.50–1.35)
Calcium: 8.5 mg/dL (ref 8.4–10.5)
Calcium: 8.6 mg/dL (ref 8.4–10.5)
Calcium: 8.6 mg/dL (ref 8.4–10.5)
Chloride: 102 mEq/L (ref 96–112)
Chloride: 104 mEq/L (ref 96–112)
Creatinine, Ser: 0.58 mg/dL (ref 0.50–1.35)
Creatinine, Ser: 0.58 mg/dL (ref 0.50–1.35)
Creatinine, Ser: 0.56 mg/dL (ref 0.50–1.35)
Creatinine, Ser: 0.52 mg/dL (ref 0.50–1.35)
GFR calc Af Amer: >90 mL/min (ref >90)
GFR calc Af Amer: >90 mL/min (ref >90)
GFR calc non Af Amer: >90 mL/min (ref >90)
GFR calc non Af Amer: >90 mL/min (ref >90)
GFR calc non Af Amer: >90 mL/min (ref >90)
GLUCOSE: 193 mg/dL — AB (ref 70–99)
Glucose, Bld: 245 mg/dL — ABNORMAL HIGH (ref 70–99)
Glucose, Bld: 112 mg/dL — ABNORMAL HIGH (ref 70–99)
Glucose, Bld: 183 mg/dL — ABNORMAL HIGH (ref 70–99)
Glucose, Bld: 253 mg/dL — ABNORMAL HIGH (ref 70–99)
POTASSIUM: 3.7 meq/L (ref 3.7–5.3)
POTASSIUM: 3.4 meq/L — AB (ref 3.7–5.3)
Potassium: 3.7 mEq/L (ref 3.7–5.3)
Potassium: 3.1 mEq/L — ABNORMAL LOW (ref 3.7–5.3)
Potassium: 3.4 mEq/L — ABNORMAL LOW (ref 3.7–5.3)
SODIUM: 137 meq/L (ref 137–147)
SODIUM: 137 meq/L (ref 137–147)
SODIUM: 135 meq/L — AB (ref 137–147)
Sodium: 136 mEq/L — ABNORMAL LOW (ref 137–147)
Sodium: 137 mEq/L (ref 137–147)

## 2014-01-24 LAB — URINE CULTURE
Colony Count: NO GROWTH
Culture: NO GROWTH

## 2014-01-24 LAB — GLUCOSE, CAPILLARY
GLUCOSE-CAPILLARY: 225 mg/dL — AB (ref 70–99)
GLUCOSE-CAPILLARY: 192 mg/dL — AB (ref 70–99)
GLUCOSE-CAPILLARY: 76 mg/dL (ref 70–99)
GLUCOSE-CAPILLARY: 205 mg/dL — AB (ref 70–99)
GLUCOSE-CAPILLARY: 178 mg/dL — AB (ref 70–99)
GLUCOSE-CAPILLARY: 94 mg/dL (ref 70–99)
GLUCOSE-CAPILLARY: 403 mg/dL — AB (ref 70–99)
Glucose-Capillary: 131 mg/dL — ABNORMAL HIGH (ref 70–99)
Glucose-Capillary: 156 mg/dL — ABNORMAL HIGH (ref 70–99)
Glucose-Capillary: 164 mg/dL — ABNORMAL HIGH (ref 70–99)
Glucose-Capillary: 184 mg/dL — ABNORMAL HIGH (ref 70–99)
Glucose-Capillary: 186 mg/dL — ABNORMAL HIGH (ref 70–99)
Glucose-Capillary: 192 mg/dL — ABNORMAL HIGH (ref 70–99)
Glucose-Capillary: 227 mg/dL — ABNORMAL HIGH (ref 70–99)
Glucose-Capillary: 230 mg/dL — ABNORMAL HIGH (ref 70–99)
Glucose-Capillary: 276 mg/dL — ABNORMAL HIGH (ref 70–99)
Glucose-Capillary: 344 mg/dL — ABNORMAL HIGH (ref 70–99)
Glucose-Capillary: 48 mg/dL — ABNORMAL LOW (ref 70–99)
Glucose-Capillary: 80 mg/dL (ref 70–99)
Glucose-Capillary: 82 mg/dL (ref 70–99)
Glucose-Capillary: 95 mg/dL (ref 70–99)

## 2014-01-24 LAB — CBC
HCT: 35 % — ABNORMAL LOW (ref 39.0–52.0)
Hemoglobin: 12.4 g/dL — ABNORMAL LOW (ref 13.0–17.0)
MCH: 32.2 pg (ref 26.0–34.0)
MCHC: 35.4 g/dL (ref 30.0–36.0)
MCV: 90.9 fL (ref 78.0–100.0)
PLATELETS: 244 10*3/uL (ref 150–400)
RBC: 3.85 MIL/uL — ABNORMAL LOW (ref 4.22–5.81)
RDW: 13.7 % (ref 11.5–15.5)
WBC: 5.3 10*3/uL (ref 4.0–10.5)

## 2014-01-24 MED ORDER — INSULIN GLARGINE 100 UNIT/ML ~~LOC~~ SOLN
20.0000 [IU] | Freq: Once | SUBCUTANEOUS | Status: AC
  Administered 2014-01-24: 20 [IU] via SUBCUTANEOUS
  Filled 2014-01-24: qty 0.2

## 2014-01-24 MED ORDER — DEXTROSE 50 % IV SOLN: 25.0000 mL | Freq: Once | INTRAVENOUS | Status: DC | PRN

## 2014-01-24 MED ORDER — SODIUM CHLORIDE 0.9 % IV SOLN
INTRAVENOUS | Status: DC
  Administered 2014-01-24 – 2014-01-26 (×3): via INTRAVENOUS

## 2014-01-24 MED ORDER — INSULIN GLARGINE 100 UNIT/ML ~~LOC~~ SOLN
25.0000 [IU] | Freq: Every day | SUBCUTANEOUS | Status: DC
  Filled 2014-01-24: qty 0.25

## 2014-01-24 MED ORDER — INSULIN ASPART 100 UNIT/ML ~~LOC~~ SOLN: 0.0000 [IU] | Freq: Three times a day (TID) | SUBCUTANEOUS | Status: DC

## 2014-01-24 MED ORDER — BENZONATATE 100 MG PO CAPS
100.0000 mg | ORAL_CAPSULE | Freq: Three times a day (TID) | ORAL | Status: DC | PRN
  Administered 2014-01-24: 100 mg via ORAL
  Filled 2014-01-24: qty 1

## 2014-01-24 MED ORDER — POTASSIUM CHLORIDE CRYS ER 20 MEQ PO TBCR
30.0000 meq | EXTENDED_RELEASE_TABLET | Freq: Once | ORAL | Status: AC
  Administered 2014-01-24: 30 meq via ORAL
  Filled 2014-01-24: qty 1

## 2014-01-24 MED ORDER — GLUCOSE 40 % PO GEL: 1.0000 | ORAL | Status: DC | PRN

## 2014-01-24 MED ORDER — INSULIN ASPART 100 UNIT/ML ~~LOC~~ SOLN: 0.0000 [IU] | Freq: Every day | SUBCUTANEOUS | Status: DC

## 2014-01-24 MED ORDER — INSULIN ASPART 100 UNIT/ML ~~LOC~~ SOLN
8.0000 [IU] | Freq: Once | SUBCUTANEOUS | Status: AC
  Administered 2014-01-24: 8 [IU] via SUBCUTANEOUS

## 2014-01-24 MED ORDER — INSULIN ASPART 100 UNIT/ML ~~LOC~~ SOLN
0.0000 [IU] | Freq: Three times a day (TID) | SUBCUTANEOUS | Status: DC
  Administered 2014-01-25: 7 [IU] via SUBCUTANEOUS
  Administered 2014-01-25: 5 [IU] via SUBCUTANEOUS

## 2014-01-24 MED ORDER — DEXTROSE 50 % IV SOLN: 50.0000 mL | Freq: Once | INTRAVENOUS | Status: DC | PRN

## 2014-01-24 MED ORDER — DEXTROSE-NACL 5-0.45 % IV SOLN
INTRAVENOUS | Status: DC
  Administered 2014-01-24: 1000 mL via INTRAVENOUS
  Administered 2014-01-24: 19:00:00 via INTRAVENOUS

## 2014-01-24 MED ORDER — DEXTROSE 50 % IV SOLN: 50.0000 mL | INTRAVENOUS | Status: DC | PRN

## 2014-01-24 NOTE — Progress Notes (Addendum)
Inpatient Diabetes Program Recommendations  AACE/ADA: New Consensus Statement on Inpatient Glycemic Control (2013)  Target Ranges:  Prepandial:   less than 140 mg/dL      Peak postprandial:   less than 180 mg/dL (1-2 hours)      Critically ill patients:  140 - 180 mg/dL   Reason for Visit: Follow-up regarding DKA admission  Diabetes history: Admitted with DKA Outpatient Diabetes medications: Lantus 25 units, Novolog sensitive correction tid Current orders for Inpatient glycemic control: GlucoStabilizer  Note:  Will return to speak with patient.  When patient is deemed ready for transition per MD, will need to receive Lantus about 2 hours prior to stopping drip.  Home dose of 25 units should be appropriate.  When off drip, may benefit from meal coverage- 3 units tid with meals as long as he eats at least 50%.  Thank you.  Abbas Beyene S. Marcelline Mates, RN, CNS, CDE Inpatient Diabetes Program, team pager [phone]  Addendum: Last lab CO2 = 20.  GAP at 15.  Is there another reason GAP is still high?

## 2014-01-24 NOTE — Care Management Note (Addendum)
    Page 1 of 1   01/26/2014     12:36:56 PM CARE MANAGEMENT NOTE 01/26/2014  Patient:  Jared Murray, Jared Murray   Account Number:  [id number]  Date Initiated:  01/23/2014  Documentation initiated by:  Luz Lex  Subjective/Objective Assessment:   DKA - on insulin drip.     Action/Plan:   Anticipated DC Date:  01/26/2014   Anticipated DC Plan:  [place]  CM consult      Choice offered to / List presented to:             Status of service:  Completed, signed off Medicare Important Message given?   (If response is "NO", the following Medicare IM given date fields will be blank) Date Medicare IM given:   Date Additional Medicare IM given:    Discharge Disposition:  HOME/SELF CARE  Per UR Regulation:  Reviewed for med. necessity/level of care/duration of stay  If discussed at [place] of Stay Meetings, dates discussed:    Comments:  ContactDerrik, [place] [phone]  01/26/14 [place], BSN [phone] NCM spoke with patient , scheduled pateint apt at [facility] clinic,  also gave patient medicaid list of MD's who accept Medicaid if he wants to switch to one of those MD's,  Aslo patient's sister states she has phone number to family practice if she wants to call and try to get him an apt there.  01/25/14 [place] MSN BSN CCM Per pt, [facility] has been designated by [employer] to provide his primary care - he has already gone for an initial appointment.  States he received a call yesterday from the clinic telling him that they were mailing his next appointment time to his home. Is also using the [facility] pharmacy.  01/24/14 [id number] [place] MSN BSN CCM Pt has [employer] - physician has been assigned by DSS.  When Ms Brearley arrives home after work, pt will ask her to look at his medicaid card to determine the name of the designated physician.

## 2014-01-24 NOTE — Progress Notes (Signed)
Patient blood sugar up to 403.Patient has no sliding scale insulin coverage .Call placed to Forrest Moron NP. New orders received and carried out.

## 2014-01-24 NOTE — Progress Notes (Signed)
Inpatient Diabetes Program Recommendations  AACE/ADA: New Consensus Statement on Inpatient Glycemic Control (2013)  Target Ranges:  Prepandial:   less than 140 mg/dL      Peak postprandial:   less than 180 mg/dL (1-2 hours)      Critically ill patients:  140 - 180 mg/dL   Reason for Visit: Follow-up regarding DKA admission  Diabetes history: Admitted with DKA  Outpatient Diabetes medications: Lantus 25 units, Novolog sensitive correction tid  Current orders for Inpatient glycemic control: GlucoStabilizer  Note:  Visited patient at bedside.  Discussed his recent illness.  Did not take insulin during day he was admitted.  States he was unable to get up and eat, check blood glucose etc.  Was home alone most of that day.  When wife found out he couldn't get up and eat and hadn't taken his insulin, she called his sister who is an Therapist, sports.  Sister told them to come to ED.  Discussed with patient need to have members of his family trained in use of his glucose meter and insulin administration.  Has a couple adult children and another old enough to learn.  States that he was doing pretty good with everything, but over the last month had problems staying on point with his diabetes.  Attributes problems to being stressed out.  During last month was missing some doses of Novolog.  Probably only missed one day of Lantus, but was not taking it at time of day he was supposed to.  [facility]  Patient states he was not contacted to make appointment.  Will place another referral.   Patient states Medicaid runs out in August.  Suggested he call his Education officer, museum as soon as he gets home to find out when he needs to start the re-application process.  Uses vial and syringes to take insulins.  Seems to be interested in insulin pens.  Will show patient an insulin pen to see if he wants to switch to this delivery system.  Thank you.  Jared Murray S. Marcelline Mates, RN,  CNS, CDE Inpatient Diabetes Program, team pager [phone]

## 2014-01-24 NOTE — Progress Notes (Addendum)
[facility] PROGRESS NOTE  Jared Murray IEP:[id number] DOB: 1976/07/05 DOA: 01/23/2014 PCP: No PCP Per Patient  Assessment/Plan: 1. DKA -anion gap at 15 -continue Insulin gtt, D51/2 NS -Bmet Q4h -triggered due to URI and missed Lantus dose while sick -hbaic 12.3 suggesting poor long term control -DM coordinator consult  2. URI -supportive care -afebrile, CXR clear  3. Leukocytosis -likely reactive  -repeat in am  4. Chronic eczema  DVT proph: lovenox  Code Status: Full Code Family Communication: none at bedside Disposition Plan: keep in SDU till gap closed   Consultants:  DM coordinator  HPI/Subjective: Feels better, anxious to eat  Objective: Filed Vitals:   01/24/14 1130  BP: 102/67  Pulse: 88  Temp: 98.3 F (36.8 C)  Resp: 14    Intake/Output Summary (Last 24 hours) at 01/24/14 1542 Last data filed at 01/24/14 1002  Gross per 24 hour  Intake 1958.79 ml  Output      0 ml  Net 1958.79 ml   Filed Weights   01/23/14 0017 01/23/14 1655 01/24/14 0416  Weight: 65.772 kg (145 lb) 63.3 kg (139 lb 8.8 oz) 63.3 kg (139 lb 8.8 oz)    Exam:   General:  AAOx3  Cardiovascular: S1S2/RRR  Respiratory: CTAB  Abdomen: soft, Nt, Bs present  Musculoskeletal: no edema c/c   Skin: chronic eczema lesions, esp on legs  Data Reviewed: Basic Metabolic Panel:  Recent Labs Lab 01/23/14 1250 01/23/14 1500 01/24/14 0016 01/24/14 0510 01/24/14 0905  NA 138 138 136* 137 137  K 4.1 3.9 3.7 3.1* 3.4*  CL 105 105 99 102 102  CO2 16* 17* 16* 20 20  GLUCOSE 158* 98 245* 112* 193*  BUN 8 7 5* 4* 4*  CREATININE 0.67 0.61 0.58 0.58 0.53  CALCIUM 8.8 8.7 8.5 8.8 8.6   Liver Function Tests:  Recent Labs Lab 01/23/14 0045  AST 27  ALT 22  ALKPHOS 140*  BILITOT 0.4  PROT 9.3*  ALBUMIN 5.1    Recent Labs Lab 01/23/14 0045  LIPASE 16   No results found for this basename: AMMONIA,  in the last 168 hours CBC:  Recent Labs Lab  01/23/14 0045 01/23/14 0320 01/23/14 0845 01/24/14 1100  WBC 14.5*  --  15.4* 5.3  NEUTROABS 12.9*  --   --   --   HGB 16.4 18.0* 12.9* 12.4*  HCT 47.3 53.0* 37.8* 35.0*  MCV 96.7  --  94.7 90.9  PLT 355  --  290 244   Cardiac Enzymes:  Recent Labs Lab 01/23/14 0045 01/23/14 0315 01/23/14 0848 01/23/14 1535 01/23/14 2200  TROPONINI <0.30 <0.30 <0.30 <0.30 <0.30   BNP (last 3 results) No results found for this basename: PROBNP,  in the last 8760 hours CBG:  Recent Labs Lab 01/24/14 0325 01/24/14 0512 01/24/14 0618 01/24/14 0725 01/24/14 0813  GLUCAP 192* 95 76 80 82    Recent Results (from the past 240 hour(s))  MRSA PCR SCREENING     Status: None   Collection Time    01/23/14  6:51 AM      Result Value Ref Range Status   MRSA by PCR NEGATIVE  NEGATIVE Final   Comment:            The GeneXpert MRSA Assay (FDA     approved for NASAL specimens     only), is one component of a     comprehensive MRSA colonization     surveillance program. It is not  intended to diagnose MRSA     infection nor to guide or     monitor treatment for     MRSA infections.  URINE CULTURE     Status: None   Collection Time    01/23/14  8:13 AM      Result Value Ref Range Status   Specimen Description URINE, CLEAN CATCH   Final   Special Requests NONE   Final   Culture  Setup Time     Final   Value: 01/23/2014 09:00     Performed at [facility] Count     Final   Value: NO GROWTH     Performed at [facility]   Culture     Final   Value: NO GROWTH     Performed at [facility]   Report Status 01/24/2014 FINAL   Final  RAPID STREP SCREEN     Status: None   Collection Time    01/23/14 11:00 AM      Result Value Ref Range Status   Streptococcus, Group A Screen (Direct) NEGATIVE  NEGATIVE Final   Comment: (NOTE)     A Rapid Antigen test may result negative if the antigen level in the     sample is below the detection level of this test.  The FDA has not     cleared this test as a stand-alone test therefore the rapid antigen     negative result has reflexed to a Group A Strep culture.  CULTURE, GROUP A STREP     Status: None   Collection Time    01/23/14 11:00 AM      Result Value Ref Range Status   Specimen Description THROAT   Final   Special Requests NONE   Final   Culture     Final   Value: Culture reincubated for better growth     Performed at [facility]   Report Status PENDING   Incomplete     Studies: Dg Chest Port 1 View  01/23/2014   CLINICAL DATA:  Cough.  EXAM: PORTABLE CHEST - 1 VIEW  COMPARISON:  Chest x-ray 07/02/2013.  FINDINGS: Mediastinum and hilar structures are normal. Lungs are clear. Heart size and pulmonary vascularity normal. No pleural effusion or pneumothorax.  IMPRESSION: No active disease.   Electronically Signed   By: Marcello Moores  Register   On: 01/23/2014 07:51    Scheduled Meds: . enoxaparin (LOVENOX) injection  40 mg Subcutaneous Q24H  . insulin aspart  0-5 Units Subcutaneous QHS  . insulin aspart  0-9 Units Subcutaneous TID WC   Continuous Infusions: . dextrose 5 % and 0.45% NaCl 1,000 mL (01/24/14 1031)  . insulin (NOVOLIN-R) infusion 3.7 Units/hr (01/24/14 1509)   Antibiotics Given (last 72 hours)   None      Active Problems:   Protein-calorie malnutrition, severe   DKA, type 1, not at goal   Dehydration    Time spent: 31min    [name]  [facility] Pager [phone]. If 7PM-7AM, please contact night-coverage at [email or web address], password [facility] 01/24/2014, 3:42 PM  LOS: 1 day

## 2014-01-25 DIAGNOSIS — E876 Hypokalemia: Secondary | ICD-10-CM

## 2014-01-25 LAB — GLUCOSE, CAPILLARY
GLUCOSE-CAPILLARY: 68 mg/dL — AB (ref 70–99)
GLUCOSE-CAPILLARY: 181 mg/dL — AB (ref 70–99)
GLUCOSE-CAPILLARY: 335 mg/dL — AB (ref 70–99)
Glucose-Capillary: 233 mg/dL — ABNORMAL HIGH (ref 70–99)
Glucose-Capillary: 277 mg/dL — ABNORMAL HIGH (ref 70–99)
Glucose-Capillary: 188 mg/dL — ABNORMAL HIGH (ref 70–99)

## 2014-01-25 LAB — CBC
HEMATOCRIT: 34.1 % — AB (ref 39.0–52.0)
Hemoglobin: 12 g/dL — ABNORMAL LOW (ref 13.0–17.0)
MCH: 31.7 pg (ref 26.0–34.0)
MCHC: 35.2 g/dL (ref 30.0–36.0)
MCV: 90.2 fL (ref 78.0–100.0)
Platelets: 234 10*3/uL (ref 150–400)
RBC: 3.78 MIL/uL — ABNORMAL LOW (ref 4.22–5.81)
RDW: 13.6 % (ref 11.5–15.5)
WBC: 4.7 10*3/uL (ref 4.0–10.5)

## 2014-01-25 LAB — BASIC METABOLIC PANEL
BUN: 3 mg/dL — AB (ref 6–23)
CHLORIDE: 102 meq/L (ref 96–112)
CO2: 21 mEq/L (ref 19–32)
CREATININE: 0.57 mg/dL (ref 0.50–1.35)
Calcium: 8.4 mg/dL (ref 8.4–10.5)
GFR calc Af Amer: >90 mL/min (ref >90)
GFR calc non Af Amer: >90 mL/min (ref >90)
GLUCOSE: 209 mg/dL — AB (ref 70–99)
Potassium: 3.2 mEq/L — ABNORMAL LOW (ref 3.7–5.3)
Sodium: 139 mEq/L (ref 137–147)

## 2014-01-25 LAB — CULTURE, GROUP A STREP

## 2014-01-25 MED ORDER — POTASSIUM CHLORIDE CRYS ER 20 MEQ PO TBCR
40.0000 meq | EXTENDED_RELEASE_TABLET | Freq: Once | ORAL | Status: AC
  Administered 2014-01-25: 40 meq via ORAL
  Filled 2014-01-25: qty 2

## 2014-01-25 MED ORDER — INSULIN GLARGINE 100 UNIT/ML ~~LOC~~ SOLN
15.0000 [IU] | Freq: Every day | SUBCUTANEOUS | Status: DC
  Administered 2014-01-25: 15 [IU] via SUBCUTANEOUS
  Filled 2014-01-25 (×2): qty 0.15

## 2014-01-25 MED ORDER — INSULIN ASPART 100 UNIT/ML ~~LOC~~ SOLN
0.0000 [IU] | Freq: Three times a day (TID) | SUBCUTANEOUS | Status: DC
  Administered 2014-01-26: 7 [IU] via SUBCUTANEOUS
  Administered 2014-01-26 (×2): 1 [IU] via SUBCUTANEOUS

## 2014-01-25 MED ORDER — POTASSIUM CHLORIDE CRYS ER 20 MEQ PO TBCR
30.0000 meq | EXTENDED_RELEASE_TABLET | Freq: Once | ORAL | Status: AC
  Administered 2014-01-25: 30 meq via ORAL
  Filled 2014-01-25 (×2): qty 1

## 2014-01-25 NOTE — Progress Notes (Signed)
NURSING PROGRESS NOTE  Jared Murray [id number] Transfer Data: 01/25/2014 4:07 PM Attending Provider: Sheila Oats, MD PCP:No PCP Per Patient Code Status: FUll  Jared Murray is a 38 y.o. male patient transferred from 2 central  -No acute distress noted.  -No complaints of shortness of breath.  -No complaints of chest pain.    Blood pressure 107/74, pulse 98, temperature 98.6 F (37 C), temperature source Oral, resp. rate 18, height 5\' 10"  (1.778 m), weight 64.139 kg (141 lb 6.4 oz), SpO2 96.00%.   IV Fluids:  IV in place, occlusive dsg intact without redness, IV cath Left arm running NS. Right arm is SL.   Allergies:  Penicillins  Past Medical History:   has a past medical history of Eczema; Type 1 diabetes mellitus; and Psoriasis.  Past Surgical History:   has past surgical history that includes Finger surgery.  Social History:   reports that he has been smoking.  He does not have any smokeless tobacco history on file. He reports that he drinks alcohol. He reports that he uses illicit drugs (Marijuana).  Skin: Eczema noted throughout body.  Patient/Family orientated to room. Information packet given to patient/family. Admission inpatient armband information verified with patient/family to include name and date of birth and placed on patient arm. Side rails up x 2, fall assessment and education completed with patient/family. Patient/family able to verbalize understanding of risk associated with falls and verbalized understanding to call for assistance before getting out of bed. Call light within reach. Patient/family able to voice and demonstrate understanding of unit orientation instructions.    Will continue to evaluate and treat per MD orders.

## 2014-01-25 NOTE — Progress Notes (Signed)
[facility] PROGRESS NOTE  Jared Murray BHA:[id number] DOB: Jun 15, 1976 DOA: 01/23/2014 PCP: No PCP Per Patient  Assessment/Plan: 1. DKA -anion gap acidosis resolved and IV insulin discontinued on 6/9 PM -triggered due to URI and missed Lantus dose while sick -hbaic 12.3 suggesting poor long term control -DM coordinator following -Continue Lantus with sliding scale coverage>> follow and adjust Lantus dose.  2. URI -supportive care -afebrile, CXR clear  3. Leukocytosis -likely reactive, resolved   4. Chronic eczema 5. Hypokalemia -Replace K.  DVT proph: lovenox  Code Status: Full Code Family Communication: none at bedside Disposition Plan: Transfer to med surge   Consultants:  DM coordinator  HPI/Subjective: Denies nausea and no vomiting. Tolerating by mouth well  Objective: Filed Vitals:   01/25/14 0822  BP: 120/80  Pulse: 84  Temp: 97.8 F (36.6 Murray)  Resp: 16    Intake/Output Summary (Last 24 hours) at 01/25/14 1043 Last data filed at 01/25/14 0500  Gross per 24 hour  Intake   1595 ml  Output   1500 ml  Net     95 ml   Filed Weights   01/23/14 0017 01/23/14 1655 01/24/14 0416  Weight: 65.772 kg (145 lb) 63.3 kg (139 lb 8.8 oz) 63.3 kg (139 lb 8.8 oz)    Exam:   General:  AAOx3  Cardiovascular: S1S2/RRR  Respiratory: CTAB  Abdomen: soft, Nt, Bs present  Musculoskeletal: no edema Murray/Murray   Skin: chronic eczema lesions, esp on legs  Data Reviewed: Basic Metabolic Panel:  Recent Labs Lab 01/24/14 0510 01/24/14 0905 01/24/14 1450 01/24/14 1835 01/25/14 0336  NA 137 137 137 135* 139  K 3.1* 3.4* 3.7 3.4* 3.2*  CL 102 102 104 101 102  CO2 20 20 20 20 21   GLUCOSE 112* 193* 183* 253* 209*  BUN 4* 4* 4* 4* 3*  CREATININE 0.58 0.53 0.56 0.52 0.57  CALCIUM 8.8 8.6 8.6 8.6 8.4   Liver Function Tests:  Recent Labs Lab 01/23/14 0045  AST 27  ALT 22  ALKPHOS 140*  BILITOT 0.4  PROT 9.3*  ALBUMIN 5.1    Recent Labs Lab  01/23/14 0045  LIPASE 16   No results found for this basename: AMMONIA,  in the last 168 hours CBC:  Recent Labs Lab 01/23/14 0045 01/23/14 0320 01/23/14 0845 01/24/14 1100 01/25/14 0336  WBC 14.5*  --  15.4* 5.3 4.7  NEUTROABS 12.9*  --   --   --   --   HGB 16.4 18.0* 12.9* 12.4* 12.0*  HCT 47.3 53.0* 37.8* 35.0* 34.1*  MCV 96.7  --  94.7 90.9 90.2  PLT 355  --  290 244 234   Cardiac Enzymes:  Recent Labs Lab 01/23/14 0045 01/23/14 0315 01/23/14 0848 01/23/14 1535 01/23/14 2200  TROPONINI <0.30 <0.30 <0.30 <0.30 <0.30   BNP (last 3 results) No results found for this basename: PROBNP,  in the last 8760 hours CBG:  Recent Labs Lab 01/24/14 2207 01/24/14 2259 01/25/14 0305 01/25/14 0825 01/25/14 0925  GLUCAP 344* 276* 233* 68* 181*    Recent Results (from the past 240 hour(s))  MRSA PCR SCREENING     Status: None   Collection Time    01/23/14  6:51 AM      Result Value Ref Range Status   MRSA by PCR NEGATIVE  NEGATIVE Final   Comment:            The GeneXpert MRSA Assay (FDA     approved for NASAL specimens  only), is one component of a     comprehensive MRSA colonization     surveillance program. It is not     intended to diagnose MRSA     infection nor to guide or     monitor treatment for     MRSA infections.  URINE CULTURE     Status: None   Collection Time    01/23/14  8:13 AM      Result Value Ref Range Status   Specimen Description URINE, CLEAN CATCH   Final   Special Requests NONE   Final   Culture  Setup Time     Final   Value: 01/23/2014 09:00     Performed at [facility]     Final   Value: NO GROWTH     Performed at [facility]   Culture     Final   Value: NO GROWTH     Performed at [facility]   Report Status 01/24/2014 FINAL   Final  RAPID STREP SCREEN     Status: None   Collection Time    01/23/14 11:00 AM      Result Value Ref Range Status   Streptococcus, Group A Screen  (Direct) NEGATIVE  NEGATIVE Final   Comment: (NOTE)     A Rapid Antigen test may result negative if the antigen level in the     sample is below the detection level of this test. The FDA has not     cleared this test as a stand-alone test therefore the rapid antigen     negative result has reflexed to a Group A Strep culture.  CULTURE, GROUP A STREP     Status: None   Collection Time    01/23/14 11:00 AM      Result Value Ref Range Status   Specimen Description THROAT   Final   Special Requests NONE   Final   Culture     Final   Value: Culture reincubated for better growth     Performed at [facility]   Report Status PENDING   Incomplete     Studies: No results found.  Scheduled Meds: . enoxaparin (LOVENOX) injection  40 mg Subcutaneous Q24H  . insulin aspart  0-5 Units Subcutaneous QHS  . insulin aspart  0-9 Units Subcutaneous TID WC  . insulin glargine  15 Units Subcutaneous Daily   Continuous Infusions: . sodium chloride 75 mL/hr at 01/24/14 2300   Antibiotics Given (last 72 hours)   None      Active Problems:   Protein-calorie malnutrition, severe   DKA, type 1, not at goal   Dehydration    Time spent: 5min    Jared Murray  [facility] Pager [phone]. If 7PM-7AM, please contact night-coverage at [email or web address], password [facility] 01/25/2014, 10:43 AM  LOS: 2 days

## 2014-01-26 LAB — BASIC METABOLIC PANEL
BUN: 3 mg/dL — ABNORMAL LOW (ref 6–23)
CALCIUM: 8.2 mg/dL — AB (ref 8.4–10.5)
CO2: 24 mEq/L (ref 19–32)
Chloride: 107 mEq/L (ref 96–112)
Creatinine, Ser: 0.52 mg/dL (ref 0.50–1.35)
GFR calc Af Amer: >90 mL/min (ref >90)
Glucose, Bld: 102 mg/dL — ABNORMAL HIGH (ref 70–99)
POTASSIUM: 3.2 meq/L — AB (ref 3.7–5.3)
Sodium: 143 mEq/L (ref 137–147)

## 2014-01-26 LAB — GLUCOSE, CAPILLARY
Glucose-Capillary: 130 mg/dL — ABNORMAL HIGH (ref 70–99)
Glucose-Capillary: 322 mg/dL — ABNORMAL HIGH (ref 70–99)
Glucose-Capillary: 125 mg/dL — ABNORMAL HIGH (ref 70–99)

## 2014-01-26 MED ORDER — PNEUMOCOCCAL VAC POLYVALENT 25 MCG/0.5ML IJ INJ
0.5000 mL | INJECTION | Freq: Once | INTRAMUSCULAR | Status: AC
  Administered 2014-01-26: 0.5 mL via INTRAMUSCULAR
  Filled 2014-01-26: qty 0.5

## 2014-01-26 MED ORDER — INSULIN GLARGINE 100 UNIT/ML ~~LOC~~ SOLN
25.0000 [IU] | Freq: Every day | SUBCUTANEOUS | Status: DC
  Administered 2014-01-26: 25 [IU] via SUBCUTANEOUS
  Filled 2014-01-26: qty 0.25

## 2014-01-26 MED ORDER — POTASSIUM CHLORIDE CRYS ER 20 MEQ PO TBCR
40.0000 meq | EXTENDED_RELEASE_TABLET | ORAL | Status: AC
  Administered 2014-01-26: 40 meq via ORAL
  Filled 2014-01-26: qty 2

## 2014-01-26 NOTE — Progress Notes (Signed)
Physician paged to complete pt's med rec so we can discharge the patient tonight per previous order.

## 2014-01-26 NOTE — Progress Notes (Signed)
Spoke with staff RN on phone.  Asked nurse to have patient draw up a dosage and give himself insulin to check his procedure of insulin administration.  Check patient on doing own CBGs.  Case manager states that patient will be seen at [facility] for follow up. Has been seen there for an initial visit prior to admission to hospital.  Will follow while in hospital.  Harvel Ricks RN BSN CDE

## 2014-01-26 NOTE — Progress Notes (Signed)
NUTRITION FOLLOW-UP  DOCUMENTATION CODES Per approved criteria  -Not Applicable   INTERVENTION: Continue current interventions, pt is eating very well. Patient declined education at this time. RD to continue to follow nutrition care plan.  NUTRITION DIAGNOSIS: Inadequate oral intake - resolved.  Goal: Pt to meet >/= 90% of their estimated nutrition needs - met.  Monitor:  PO intake, weight, labs, I/O's, need for education  ASSESSMENT: Patient with PMH of eczema,Type 1 diabetes mellitus and psoriasis; presented with 3 days hx of chills, cough, N/V; his daughter got sick recently and he believes he got a GI bug from her; blood sugar continued to rise; called EMS and was brought to the ER.  Currently eating 100% of Carbohydrate Modified Medium meal. Pt confirms that he is eating well. He notes that he has been set-up to attend a diabetes class when he discharges. He denies any nutrition questions at this time.  CBG's: 130 - 322 Potassium is low at 3.2 --> ordered for KCl  Height: Ht Readings from Last 1 Encounters:  01/23/14 _0  (1.778 m)    Weight: Wt Readings from Last 1 Encounters:  01/25/14 141 lb 6.4 oz (64.139 kg)  Admit wt 145 lb  BMI:  Body mass index is 20.29 kg/(m^2). WNL  Estimated Nutritional Needs: Kcal: 2000-2200 Protein: 85-95 gm Fluid: 2.0-2.2 L  Skin: Intact  Diet Order: Carb Control   Intake/Output Summary (Last 24 hours) at 01/26/14 1535 Last data filed at 01/26/14 1300  Gross per 24 hour  Intake   3307 ml  Output   1000 ml  Net   2307 ml    Last BM:  6/9  Labs:   Recent Labs Lab 01/24/14 1835 01/25/14 0336 01/26/14 0633  NA 135* 139 143  K 3.4* 3.2* 3.2*  CL 101 102 107  CO2 _1 BUN 4* 3* 3*  CREATININE 0.52 0.57 0.52  CALCIUM 8.6 8.4 8.2*  GLUCOSE 253* 209* 102*    CBG (last 3)   Recent Labs  01/25/14 2253 01/26/14 0759 01/26/14 1147  GLUCAP 188* 130* 322*    Scheduled Meds: . enoxaparin (LOVENOX)  injection  40 mg Subcutaneous Q24H  . insulin aspart  0-5 Units Subcutaneous QHS  . insulin aspart  0-9 Units Subcutaneous TID WC  . insulin glargine  25 Units Subcutaneous Daily  . potassium chloride  40 mEq Oral Q4H    Continuous Infusions: . sodium chloride 75 mL/hr at 01/26/14 0108    Inda Coke MS, RD, LDN Inpatient Registered Dietitian Pager: [phone] After-hours pager: [phone]

## 2014-01-26 NOTE — Progress Notes (Signed)
Pt discharged per physician's order. Discharge papers reviewed with patient. IVs removed. Skin c/d/i - pt has hx of psoriasis and eczema - spread over pt's body. Pt in no s/s of distress. Tech walk pt down to main entrance - wife to pick pt up.

## 2014-01-26 NOTE — Discharge Summary (Signed)
Physician Discharge Summary  Jared Murray UXL:[id number] DOB: 18-Oct-1975 DOA: 01/23/2014  PCP: No PCP Per Patient  Admit date: 01/23/2014 Discharge date: 01/26/2014  Time spent: <30 minutes  Recommendations for Outpatient Follow-up:  Follow-up Information   Follow up with [facility]     On 02/24/2014. (3:30)    Contact information:   [place] [address] [phone]       Discharge Diagnoses:  Active Problems:   Protein-calorie malnutrition, severe   DKA, type 1, not at goal   Dehydration   Discharge Condition: Improved/stable  Diet recommendation: Modified carbohydrate  Filed Weights   01/23/14 1655 01/24/14 0416 01/25/14 1300  Weight: 63.3 kg (139 lb 8.8 oz) 63.3 kg (139 lb 8.8 oz) 64.139 kg (141 lb 6.4 oz)    History of present illness:  Patient is a 38 y.o. male With past medical history of Eczema; Type 1 diabetes mellitus; and Psoriasis.  Presented with 3 days of chills cough nausea and vomiting. His daughter got sick recently and he believes he got a Gi bug from her. Patient have had some chest discomfort while vomiting but not now. Reports sore throat. Fatigue. His blood sugar continued to rize. He called EMS and was brought to ER. ABG 7.03/11.8/ 118. He was admitted for further evaluation and management.   Hospital Course:  1.DKA -As discussed above upon admission patient was placed on IV insulin via glucostabilizer. His Accu-Cheks and Bmets were monitored closely and when the anion gap acidosis resolved - IV insulin was discontinued on 6/9 PM  -The impression was that it was triggered due to URI and missed Lantus dose while sick  -A1c was done 12.3 suggesting poor long term control  -DM coordinator was consulted as well as dietary and saw patient, and is also set up to follow up outpatient diabetes education -After he was transitioned to subcutaneous insulin his dose was adjusted and his blood glucose control  this p.m. is improved. He is to continue the Lantus upon discharge with sliding scale and [facility] for continued monitoring of his checks and further adjustment of his insulin as clinically appropriate to optimize his blood glucose control. 2. URI  -afebrile, CXR clear  -Clinically improved with supportive care. 3. Leukocytosis  -likely reactive, resolved  4. Chronic eczema  5. Hypokalemia  -His potassium was replaced today.   Procedures:  None  Consultations:  Dietary  DM coordinator  Discharge Exam: Filed Vitals:   01/26/14 1324  BP: 118/81  Pulse: 82  Temp: 98.9 F (37.2 C)  Resp: 18   Exam:  General: AAOx3  Cardiovascular: S1S2/RRR  Respiratory: CTAB  Abdomen: soft, Nt, Bs present  Musculoskeletal: no edema c/c  Skin: chronic eczema lesions, esp on legs    Discharge Instructions You were cared for by a hospitalist during your hospital stay. If you have any questions about your discharge medications or the care you received while you were in the hospital after you are discharged, you can call the unit and asked to speak with the hospitalist on call if the hospitalist that took care of you is not available. Once you are discharged, your primary care physician will handle any further medical issues. Please note that NO REFILLS for any discharge medications will be authorized once you are discharged, as it is imperative that you return to your primary care physician (or establish a relationship with a primary care physician if you do  not have one) for your aftercare needs so that they can reassess your need for medications and monitor your lab values.  Discharge Instructions   Ambulatory referral to Nutrition and Diabetic Education    Complete by:  As directed   Was referred to [facility] last admission in November 2014.  Patient states he was never contacted to schedule.  Patient has never been taught to count CHO's.  Uses correction scale  only for Novolog.  Cell phone # is [phone].  Alternate number is his sister, Eugenie Birks, RN at [phone].     Diet Carb Modified    Complete by:  As directed      Increase activity slowly    Complete by:  As directed             Medication List         insulin glargine 100 UNIT/ML injection  Commonly known as:  LANTUS  Inject 25 Units into the skin daily.     NOVOLOG 100 UNIT/ML injection  Generic drug:  insulin aspart  Sliding scale CBG 70 - 120: 0 units CBG 121 - 150: 1 unit,  CBG 151 - 200: 2 units,  CBG 201 - 250: 3 units,  CBG 251 - 300: 5 units,  CBG 301 - 350: 7 units,  CBG 351 - 400: 9 units   CBG > 400: 10 units       Allergies  Allergen Reactions  . Penicillins     Childhood allergy       Follow-up Information   Follow up with [facility]     On 02/24/2014. (3:30)    Contact information:   [place] [address] [phone]       The results of significant diagnostics from this hospitalization (including imaging, microbiology, ancillary and laboratory) are listed below for reference.    Significant Diagnostic Studies: Dg Chest Port 1 View  01/23/2014   CLINICAL DATA:  Cough.  EXAM: PORTABLE CHEST - 1 VIEW  COMPARISON:  Chest x-ray 07/02/2013.  FINDINGS: Mediastinum and hilar structures are normal. Lungs are clear. Heart size and pulmonary vascularity normal. No pleural effusion or pneumothorax.  IMPRESSION: No active disease.   Electronically Signed   By: Marcello Moores  Register   On: 01/23/2014 07:51    Microbiology: Recent Results (from the past 240 hour(s))  MRSA PCR SCREENING     Status: None   Collection Time    01/23/14  6:51 AM      Result Value Ref Range Status   MRSA by PCR NEGATIVE  NEGATIVE Final   Comment:            The GeneXpert MRSA Assay (FDA     approved for NASAL specimens     only), is one component of a     comprehensive MRSA colonization     surveillance program. It is not      intended to diagnose MRSA     infection nor to guide or     monitor treatment for     MRSA infections.  URINE CULTURE     Status: None   Collection Time    01/23/14  8:13 AM      Result Value Ref Range Status   Specimen Description URINE, CLEAN CATCH   Final   Special Requests NONE   Final   Culture  Setup Time     Final   Value: 01/23/2014 09:00  Performed at [facility]     Final   Value: NO GROWTH     Performed at [facility]   Culture     Final   Value: NO GROWTH     Performed at [facility]   Report Status 01/24/2014 FINAL   Final  RAPID STREP SCREEN     Status: None   Collection Time    01/23/14 11:00 AM      Result Value Ref Range Status   Streptococcus, Group A Screen (Direct) NEGATIVE  NEGATIVE Final   Comment: (NOTE)     A Rapid Antigen test may result negative if the antigen level in the     sample is below the detection level of this test. The FDA has not     cleared this test as a stand-alone test therefore the rapid antigen     negative result has reflexed to a Group A Strep culture.  CULTURE, GROUP A STREP     Status: None   Collection Time    01/23/14 11:00 AM      Result Value Ref Range Status   Specimen Description THROAT   Final   Special Requests NONE   Final   Culture     Final   Value: STREPTOCOCCUS,BETA HEMOLYTIC NOT GROUP A     Performed at [facility]   Report Status 01/25/2014 FINAL   Final     Labs: Basic Metabolic Panel:  Recent Labs Lab 01/24/14 0905 01/24/14 1450 01/24/14 1835 01/25/14 0336 01/26/14 0633  NA 137 137 135* 139 143  K 3.4* 3.7 3.4* 3.2* 3.2*  CL 102 104 101 102 107  CO2 20 20 20 21 24   GLUCOSE 193* 183* 253* 209* 102*  BUN 4* 4* 4* 3* 3*  CREATININE 0.53 0.56 0.52 0.57 0.52  CALCIUM 8.6 8.6 8.6 8.4 8.2*   Liver Function Tests:  Recent Labs Lab 01/23/14 0045  AST 27  ALT 22  ALKPHOS 140*  BILITOT 0.4  PROT 9.3*  ALBUMIN 5.1    Recent Labs Lab  01/23/14 0045  LIPASE 16   No results found for this basename: AMMONIA,  in the last 168 hours CBC:  Recent Labs Lab 01/23/14 0045 01/23/14 0320 01/23/14 0845 01/24/14 1100 01/25/14 0336  WBC 14.5*  --  15.4* 5.3 4.7  NEUTROABS 12.9*  --   --   --   --   HGB 16.4 18.0* 12.9* 12.4* 12.0*  HCT 47.3 53.0* 37.8* 35.0* 34.1*  MCV 96.7  --  94.7 90.9 90.2  PLT 355  --  290 244 234   Cardiac Enzymes:  Recent Labs Lab 01/23/14 0045 01/23/14 0315 01/23/14 0848 01/23/14 1535 01/23/14 2200  TROPONINI <0.30 <0.30 <0.30 <0.30 <0.30   BNP: BNP (last 3 results) No results found for this basename: PROBNP,  in the last 8760 hours CBG:  Recent Labs Lab 01/25/14 1711 01/25/14 2253 01/26/14 0759 01/26/14 1147 01/26/14 1654  GLUCAP 277* 188* 130* 322* 125*       Signed:  Shonteria Abeln C  [facility] 01/26/2014, 6:43 PM

## 2014-01-26 NOTE — Progress Notes (Signed)
PIV's removed.  Tan, stringy drainage was present on assessment when PIV was removed from R AC.  No evidence of redness or swelling.  Encouraged pt to monitor when discharged at home and to keep site clean with soap and water.  Educated pt on s/s of infection and to contact MD if it becomes worse.

## 2014-02-17 ENCOUNTER — Ambulatory Visit: Payer: Medicaid Other | Admitting: *Deleted

## 2014-02-24 ENCOUNTER — Inpatient Hospital Stay: Payer: Medicaid Other | Admitting: Internal Medicine

## 2014-03-22 ENCOUNTER — Other Ambulatory Visit: Payer: Self-pay | Admitting: *Deleted

## 2014-03-22 DIAGNOSIS — IMO0001 Reserved for inherently not codable concepts without codable children: Secondary | ICD-10-CM

## 2014-03-22 DIAGNOSIS — E1165 Type 2 diabetes mellitus with hyperglycemia: Principal | ICD-10-CM

## 2014-03-22 MED ORDER — "INSULIN SYRINGE-NEEDLE U-100 29G X 1/2"" 2 ML MISC": Status: DC

## 2014-04-04 ENCOUNTER — Other Ambulatory Visit: Payer: Self-pay | Admitting: *Deleted

## 2014-04-18 ENCOUNTER — Encounter: Payer: Self-pay | Admitting: Internal Medicine

## 2014-04-18 ENCOUNTER — Ambulatory Visit: Payer: Medicaid Other | Attending: Internal Medicine | Admitting: Internal Medicine

## 2014-04-18 VITALS — BP 116/79 | HR 82 | Temp 98.2°F | Resp 16 | Wt 148.2 lb

## 2014-04-18 DIAGNOSIS — M25559 Pain in unspecified hip: Secondary | ICD-10-CM | POA: Diagnosis not present

## 2014-04-18 DIAGNOSIS — G8929 Other chronic pain: Secondary | ICD-10-CM | POA: Insufficient documentation

## 2014-04-18 DIAGNOSIS — E139 Other specified diabetes mellitus without complications: Secondary | ICD-10-CM

## 2014-04-18 DIAGNOSIS — E119 Type 2 diabetes mellitus without complications: Secondary | ICD-10-CM | POA: Diagnosis present

## 2014-04-18 DIAGNOSIS — E089 Diabetes mellitus due to underlying condition without complications: Secondary | ICD-10-CM

## 2014-04-18 DIAGNOSIS — Z794 Long term (current) use of insulin: Secondary | ICD-10-CM | POA: Insufficient documentation

## 2014-04-18 DIAGNOSIS — F17200 Nicotine dependence, unspecified, uncomplicated: Secondary | ICD-10-CM | POA: Insufficient documentation

## 2014-04-18 DIAGNOSIS — M25552 Pain in left hip: Secondary | ICD-10-CM

## 2014-04-18 HISTORY — DX: Pain in left hip: M25.552

## 2014-04-18 LAB — GLUCOSE, POCT (MANUAL RESULT ENTRY): POC GLUCOSE: 282 mg/dL — AB (ref 70–99)

## 2014-04-18 LAB — POCT GLYCOSYLATED HEMOGLOBIN (HGB A1C): Hemoglobin A1C: 9.2

## 2014-04-18 NOTE — Patient Instructions (Signed)
Diabetes Mellitus and Food It is important for you to manage your blood sugar (glucose) level. Your blood glucose level can be greatly affected by what you eat. Eating healthier foods in the appropriate amounts throughout the day at about the same time each day will help you control your blood glucose level. It can also help slow or prevent worsening of your diabetes mellitus. Healthy eating may even help you improve the level of your blood pressure and reach or maintain a healthy weight.  HOW CAN FOOD AFFECT ME? Carbohydrates Carbohydrates affect your blood glucose level more than any other type of food. Your dietitian will help you determine how many carbohydrates to eat at each meal and teach you how to count carbohydrates. Counting carbohydrates is important to keep your blood glucose at a healthy level, especially if you are using insulin or taking certain medicines for diabetes mellitus. Alcohol Alcohol can cause sudden decreases in blood glucose (hypoglycemia), especially if you use insulin or take certain medicines for diabetes mellitus. Hypoglycemia can be a life-threatening condition. Symptoms of hypoglycemia (sleepiness, dizziness, and disorientation) are similar to symptoms of having too much alcohol.  If your health care provider has given you approval to drink alcohol, do so in moderation and use the following guidelines:  Women should not have more than one drink per day, and men should not have more than two drinks per day. One drink is equal to:  12 oz of beer.  5 oz of wine.  1 oz of hard liquor.  Do not drink on an empty stomach.  Keep yourself hydrated. Have water, diet soda, or unsweetened iced tea.  Regular soda, juice, and other mixers might contain a lot of carbohydrates and should be counted. WHAT FOODS ARE NOT RECOMMENDED? As you make food choices, it is important to remember that all foods are not the same. Some foods have fewer nutrients per serving than other  foods, even though they might have the same number of calories or carbohydrates. It is difficult to get your body what it needs when you eat foods with fewer nutrients. Examples of foods that you should avoid that are high in calories and carbohydrates but low in nutrients include:  Trans fats (most processed foods list trans fats on the Nutrition Facts label).  Regular soda.  Juice.  Candy.  Sweets, such as cake, pie, doughnuts, and cookies.  Fried foods. WHAT FOODS CAN I EAT? Have nutrient-rich foods, which will nourish your body and keep you healthy. The food you should eat also will depend on several factors, including:  The calories you need.  The medicines you take.  Your weight.  Your blood glucose level.  Your blood pressure level.  Your cholesterol level. You also should eat a variety of foods, including:  Protein, such as meat, poultry, fish, tofu, nuts, and seeds (lean animal proteins are best).  Fruits.  Vegetables.  Dairy products, such as milk, cheese, and yogurt (low fat is best).  Breads, grains, pasta, cereal, rice, and beans.  Fats such as olive oil, trans fat-free margarine, canola oil, avocado, and olives. DOES EVERYONE WITH DIABETES MELLITUS HAVE THE SAME MEAL PLAN? Because every person with diabetes mellitus is different, there is not one meal plan that works for everyone. It is very important that you meet with a dietitian who will help you create a meal plan that is just right for you. Document Released: 05/01/2005 Document Revised: 08/09/2013 Document Reviewed: 07/01/2013 Exit[employer] Patient Information 2015 [employer]. This   information is not intended to replace advice given to you by your health care provider. Make sure you discuss any questions you have with your health care provider.  

## 2014-04-18 NOTE — Progress Notes (Signed)
Patient here for follow up on his diabetes 

## 2014-04-18 NOTE — Progress Notes (Signed)
MRN: [id number] Name: Jared Murray  Sex: male Age: 38 y.o. DOB: [birth year]  Allergies: Penicillins  Chief Complaint  Patient presents with  . Follow-up    HPI: Patient is 38 y.o. male who has history of diabetes comes today for followup, currently patient is taking Lantus 25 units each bedtime and is on NovoLog sliding scale, his A1c has improved compared to 3 months ago, he denies any hypoglycemic symptoms, patient denies smoking cigarettes, he reported to have on and off left hip pain for several months denies any fall or trauma. Past Medical History  Diagnosis Date  . Eczema   . Type 1 diabetes mellitus   . Psoriasis     Past Surgical History  Procedure Laterality Date  . Finger surgery        Medication List       This list is accurate as of: 04/18/14 12:04 PM.  Always use your most recent med list.               insulin glargine 100 UNIT/ML injection  Commonly known as:  LANTUS  Inject 25 Units into the skin daily.     Insulin Syringe-Needle U-100 29G X 1/2" 2 ML Misc  Commonly known as:  B-D INS SYRINGE 2CC/29GX1/2"  Check Blood sugar TID and QHS     NOVOLOG 100 UNIT/ML injection  Generic drug:  insulin aspart  Sliding scale CBG 70 - 120: 0 units CBG 121 - 150: 1 unit,  CBG 151 - 200: 2 units,  CBG 201 - 250: 3 units,  CBG 251 - 300: 5 units,  CBG 301 - 350: 7 units,  CBG 351 - 400: 9 units   CBG > 400: 10 units        No orders of the defined types were placed in this encounter.    Immunization History  Administered Date(s) Administered  . Pneumococcal Polysaccharide-23 03/18/2013, 01/26/2014    Family History  Problem Relation Age of Onset  . Hypertension Father   . Diabetes      multiple  . Lupus Cousin   . Stroke Maternal Grandmother   . Stroke Paternal Grandmother     History  Substance Use Topics  . Smoking status: Current Every Day Smoker  . Smokeless tobacco: Not on file  . Alcohol Use: Yes     Comment: recently quit -  7/14     Review of Systems   As noted in HPI  Filed Vitals:   04/18/14 1123  BP: 116/79  Pulse: 82  Temp: 98.2 F (36.8 C)  Resp: 16    Physical Exam  Physical Exam  Constitutional: No distress.  Eyes: EOM are normal. Pupils are equal, round, and reactive to light.  Cardiovascular: Normal rate and regular rhythm.   Pulmonary/Chest: Breath sounds normal. No respiratory distress. He has no wheezes. He has no rales.  Musculoskeletal: He exhibits no edema.    CBC    Component Value Date/Time   WBC 4.7 01/25/2014 0336   RBC 3.78* 01/25/2014 0336   HGB 12.0* 01/25/2014 0336   HCT 34.1* 01/25/2014 0336   PLT 234 01/25/2014 0336   MCV 90.2 01/25/2014 0336   LYMPHSABS 0.7 01/23/2014 0045   MONOABS 0.9 01/23/2014 0045   EOSABS 0.0 01/23/2014 0045   BASOSABS 0.0 01/23/2014 0045    CMP     Component Value Date/Time   NA 143 01/26/2014 0633   K 3.2* 01/26/2014 0633   CL 107 01/26/2014 6468  CO2 24 01/26/2014 0633   GLUCOSE 102* 01/26/2014 0633   BUN 3* 01/26/2014 0633   CREATININE 0.52 01/26/2014 0633   CALCIUM 8.2* 01/26/2014 0633   PROT 9.3* 01/23/2014 0045   ALBUMIN 5.1 01/23/2014 0045   AST 27 01/23/2014 0045   ALT 22 01/23/2014 0045   ALKPHOS 140* 01/23/2014 0045   BILITOT 0.4 01/23/2014 0045   GFRNONAA >90 01/26/2014 0633   GFRAA >90 01/26/2014 6803    No results found for this basename: chol, tri, ldl    No components found with this basename: hga1c    Lab Results  Component Value Date/Time   AST 27 01/23/2014 12:45 AM    Assessment and Plan  Diabetes mellitus due to underlying condition without complications - Plan:  Results for orders placed in visit on 04/18/14  GLUCOSE, POCT (MANUAL RESULT ENTRY)      Result Value Ref Range   POC Glucose 282 (*) 70 - 99 mg/dl  POCT GLYCOSYLATED HEMOGLOBIN (HGB A1C)      Result Value Ref Range   Hemoglobin A1C 9.2     A1c has trended down but is still uncontrolled, I have advised patient to increase the dose of Lantus to 30 units each  bedtime continue with NovoLog sliding scale, continue with glucose fingerstick log. Advised patient for diabetes meal planning.  Chronic Left hip pain - Plan: Ordered DG Hip Complete Left   Return in about 3 months (around 07/18/2014) for diabetes.  Lorayne Marek, MD

## 2014-04-21 ENCOUNTER — Telehealth: Payer: Self-pay | Admitting: Emergency Medicine

## 2014-04-21 ENCOUNTER — Ambulatory Visit (HOSPITAL_COMMUNITY)
Admission: RE | Admit: 2014-04-21 | Discharge: 2014-04-21 | Disposition: A | Discharge status: Home / Self Care | Payer: Medicaid Other | Source: Ambulatory Visit | Attending: Internal Medicine | Admitting: Internal Medicine

## 2014-04-21 DIAGNOSIS — M25559 Pain in unspecified hip: Secondary | ICD-10-CM | POA: Insufficient documentation

## 2014-04-21 DIAGNOSIS — M25552 Pain in left hip: Secondary | ICD-10-CM

## 2014-04-21 DIAGNOSIS — G8929 Other chronic pain: Secondary | ICD-10-CM | POA: Insufficient documentation

## 2014-04-21 NOTE — Telephone Encounter (Signed)
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 Patient needs to follow with orthopedics, put in the referral.       ------

## 2014-04-21 NOTE — Telephone Encounter (Signed)
Pt given xray results with instructions to f/u with provided Ortho referral

## 2014-04-25 ENCOUNTER — Telehealth: Payer: Self-pay | Admitting: Emergency Medicine

## 2014-04-25 DIAGNOSIS — M8700 Idiopathic aseptic necrosis of unspecified bone: Secondary | ICD-10-CM

## 2014-04-25 NOTE — Telephone Encounter (Signed)
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 Patient needs to follow with orthopedics, put in the referral.       ------

## 2014-04-25 NOTE — Telephone Encounter (Signed)
Pt given xray results with instructions to f/u with Orthopedic referral for treatment/evaluation Referral placed

## 2014-05-01 ENCOUNTER — Telehealth: Payer: Self-pay

## 2014-05-01 NOTE — Telephone Encounter (Signed)
Patient called complaining of pain in his leg Patient requesting a prescription for pain medication Is there something we can prescribe for him?

## 2014-05-10 ENCOUNTER — Telehealth: Payer: Self-pay | Admitting: Internal Medicine

## 2014-05-10 ENCOUNTER — Telehealth: Payer: Self-pay

## 2014-05-10 MED ORDER — IBUPROFEN 600 MG PO TABS: 600.0000 mg | ORAL_TABLET | Freq: Three times a day (TID) | ORAL | Status: DC | PRN

## 2014-05-10 NOTE — Telephone Encounter (Signed)
Returned patient call  Patient not available Left message on voice mail to return our call 

## 2014-05-10 NOTE — Telephone Encounter (Signed)
Pt calling to follow up on pain med request. Please follow up with patient.

## 2014-06-30 ENCOUNTER — Encounter (HOSPITAL_COMMUNITY): Payer: Self-pay | Admitting: Emergency Medicine

## 2014-06-30 ENCOUNTER — Inpatient Hospital Stay (HOSPITAL_COMMUNITY)
Admission: EM | Admit: 2014-06-30 | Discharge: 2014-07-03 | DRG: 638 | Disposition: A | Discharge status: Home / Self Care | Payer: Medicaid Other | Attending: Internal Medicine | Admitting: Internal Medicine

## 2014-06-30 ENCOUNTER — Emergency Department (HOSPITAL_COMMUNITY): Payer: Medicaid Other

## 2014-06-30 DIAGNOSIS — Z791 Long term (current) use of non-steroidal anti-inflammatories (NSAID): Secondary | ICD-10-CM | POA: Diagnosis not present

## 2014-06-30 DIAGNOSIS — A084 Viral intestinal infection, unspecified: Secondary | ICD-10-CM | POA: Diagnosis present

## 2014-06-30 DIAGNOSIS — E1010 Type 1 diabetes mellitus with ketoacidosis without coma: Secondary | ICD-10-CM | POA: Diagnosis present

## 2014-06-30 DIAGNOSIS — F17210 Nicotine dependence, cigarettes, uncomplicated: Secondary | ICD-10-CM | POA: Diagnosis present

## 2014-06-30 DIAGNOSIS — K5900 Constipation, unspecified: Secondary | ICD-10-CM | POA: Diagnosis present

## 2014-06-30 DIAGNOSIS — E860 Dehydration: Secondary | ICD-10-CM | POA: Diagnosis present

## 2014-06-30 DIAGNOSIS — Z794 Long term (current) use of insulin: Secondary | ICD-10-CM

## 2014-06-30 DIAGNOSIS — E876 Hypokalemia: Secondary | ICD-10-CM | POA: Diagnosis not present

## 2014-06-30 DIAGNOSIS — E1110 Type 2 diabetes mellitus with ketoacidosis without coma: Secondary | ICD-10-CM | POA: Diagnosis present

## 2014-06-30 DIAGNOSIS — R109 Unspecified abdominal pain: Secondary | ICD-10-CM

## 2014-06-30 DIAGNOSIS — N179 Acute kidney failure, unspecified: Secondary | ICD-10-CM | POA: Diagnosis present

## 2014-06-30 DIAGNOSIS — F1290 Cannabis use, unspecified, uncomplicated: Secondary | ICD-10-CM | POA: Diagnosis present

## 2014-06-30 DIAGNOSIS — Z79899 Other long term (current) drug therapy: Secondary | ICD-10-CM | POA: Diagnosis not present

## 2014-06-30 DIAGNOSIS — R079 Chest pain, unspecified: Secondary | ICD-10-CM | POA: Diagnosis present

## 2014-06-30 DIAGNOSIS — Z833 Family history of diabetes mellitus: Secondary | ICD-10-CM

## 2014-06-30 DIAGNOSIS — E1310 Other specified diabetes mellitus with ketoacidosis without coma: Secondary | ICD-10-CM | POA: Diagnosis not present

## 2014-06-30 DIAGNOSIS — F1010 Alcohol abuse, uncomplicated: Secondary | ICD-10-CM | POA: Diagnosis present

## 2014-06-30 DIAGNOSIS — E875 Hyperkalemia: Secondary | ICD-10-CM | POA: Diagnosis present

## 2014-06-30 LAB — I-STAT VENOUS BLOOD GAS, ED
ACID-BASE DEFICIT: 27 mmol/L — AB (ref 0.0–2.0)
Acid-base deficit: 24 mmol/L — ABNORMAL HIGH (ref 0.0–2.0)
BICARBONATE: 5.1 meq/L — AB (ref 20.0–24.0)
Bicarbonate: 4.3 mEq/L — ABNORMAL LOW (ref 20.0–24.0)
O2 Saturation: 60 %
O2 Saturation: 90 %
PCO2 VEN: 24.3 mmHg — AB (ref 45.0–50.0)
PCO2 VEN: 15.5 mmHg — AB (ref 45.0–50.0)
PH VEN: 6.928 — AB (ref 7.250–7.300)
PO2 VEN: 81 mmHg — AB (ref 30.0–45.0)
TCO2: 6 mmol/L (ref 0–100)
pH, Ven: 7.056 — CL (ref 7.250–7.300)
pO2, Ven: 49 mmHg — ABNORMAL HIGH (ref 30.0–45.0)

## 2014-06-30 LAB — CBC
HEMATOCRIT: 44.5 % (ref 39.0–52.0)
HEMATOCRIT: 39.4 % (ref 39.0–52.0)
HEMOGLOBIN: 14.3 g/dL (ref 13.0–17.0)
Hemoglobin: 13.1 g/dL (ref 13.0–17.0)
MCH: 31 pg (ref 26.0–34.0)
MCH: 30.5 pg (ref 26.0–34.0)
MCHC: 32.1 g/dL (ref 30.0–36.0)
MCHC: 33.2 g/dL (ref 30.0–36.0)
MCV: 96.3 fL (ref 78.0–100.0)
MCV: 91.6 fL (ref 78.0–100.0)
Platelets: 391 10*3/uL (ref 150–400)
Platelets: 329 10*3/uL (ref 150–400)
RBC: 4.62 MIL/uL (ref 4.22–5.81)
RBC: 4.3 MIL/uL (ref 4.22–5.81)
RDW: 14.2 % (ref 11.5–15.5)
RDW: 14 % (ref 11.5–15.5)
WBC: 27.7 10*3/uL — ABNORMAL HIGH (ref 4.0–10.5)
WBC: 21.3 10*3/uL — ABNORMAL HIGH (ref 4.0–10.5)

## 2014-06-30 LAB — BASIC METABOLIC PANEL
Anion gap: 34 — ABNORMAL HIGH (ref 5–15)
BUN: 23 mg/dL (ref 6–23)
BUN: 21 mg/dL (ref 6–23)
CALCIUM: 7.9 mg/dL — AB (ref 8.4–10.5)
CALCIUM: 8.6 mg/dL (ref 8.4–10.5)
CO2: 7 meq/L — AB (ref 19–32)
CREATININE: 1.03 mg/dL (ref 0.50–1.35)
Chloride: 100 mEq/L (ref 96–112)
Chloride: 104 mEq/L (ref 96–112)
Creatinine, Ser: 1.11 mg/dL (ref 0.50–1.35)
GFR calc Af Amer: >90 mL/min (ref >90)
GFR calc Af Amer: >90 mL/min (ref >90)
GFR calc non Af Amer: 83 mL/min — ABNORMAL LOW (ref >90)
GFR calc non Af Amer: >90 mL/min (ref >90)
GLUCOSE: 484 mg/dL — AB (ref 70–99)
GLUCOSE: 344 mg/dL — AB (ref 70–99)
POTASSIUM: 5.7 meq/L — AB (ref 3.7–5.3)
Potassium: 5.2 mEq/L (ref 3.7–5.3)
SODIUM: 141 meq/L (ref 137–147)
Sodium: 145 mEq/L (ref 137–147)

## 2014-06-30 LAB — COMPREHENSIVE METABOLIC PANEL
ALBUMIN: 4.5 g/dL (ref 3.5–5.2)
ALT: 16 U/L (ref 0–53)
AST: 27 U/L (ref 0–37)
Alkaline Phosphatase: 175 U/L — ABNORMAL HIGH (ref 39–117)
BUN: 24 mg/dL — ABNORMAL HIGH (ref 6–23)
CO2: <7 mEq/L — CL (ref 19–32)
CREATININE: 1.29 mg/dL (ref 0.50–1.35)
Calcium: 9.4 mg/dL (ref 8.4–10.5)
Chloride: 84 mEq/L — ABNORMAL LOW (ref 96–112)
GFR calc Af Amer: 80 mL/min — ABNORMAL LOW (ref >90)
GFR calc non Af Amer: 69 mL/min — ABNORMAL LOW (ref >90)
GLUCOSE: 710 mg/dL — AB (ref 70–99)
Potassium: 6.7 mEq/L (ref 3.7–5.3)
Sodium: 134 mEq/L — ABNORMAL LOW (ref 137–147)
Total Bilirubin: 0.5 mg/dL (ref 0.3–1.2)
Total Protein: 8.9 g/dL — ABNORMAL HIGH (ref 6.0–8.3)

## 2014-06-30 LAB — URINE MICROSCOPIC-ADD ON

## 2014-06-30 LAB — URINALYSIS, ROUTINE W REFLEX MICROSCOPIC
BILIRUBIN URINE: NEGATIVE
Glucose, UA: >1000 mg/dL — AB
Ketones, ur: >80 mg/dL — AB
LEUKOCYTES UA: NEGATIVE
NITRITE: NEGATIVE
PH: 5 (ref 5.0–8.0)
PROTEIN: 30 mg/dL — AB
Specific Gravity, Urine: 1.019 (ref 1.005–1.030)
Urobilinogen, UA: 0.2 mg/dL (ref 0.0–1.0)

## 2014-06-30 LAB — I-STAT CG4 LACTIC ACID, ED: Lactic Acid, Venous: 4.25 mmol/L — ABNORMAL HIGH (ref 0.5–2.2)

## 2014-06-30 LAB — CBG MONITORING, ED
GLUCOSE-CAPILLARY: 549 mg/dL — AB (ref 70–99)
Glucose-Capillary: >600 mg/dL (ref 70–99)
Glucose-Capillary: 433 mg/dL — ABNORMAL HIGH (ref 70–99)

## 2014-06-30 LAB — TROPONIN I: Troponin I: <0.3 ng/mL (ref <0.30)

## 2014-06-30 LAB — LIPASE, BLOOD: LIPASE: 193 U/L — AB (ref 11–59)

## 2014-06-30 LAB — PHOSPHORUS: Phosphorus: 5.6 mg/dL — ABNORMAL HIGH (ref 2.3–4.6)

## 2014-06-30 LAB — MAGNESIUM: Magnesium: 2.4 mg/dL (ref 1.5–2.5)

## 2014-06-30 LAB — KETONES, QUALITATIVE

## 2014-06-30 LAB — LACTIC ACID, PLASMA: LACTIC ACID, VENOUS: 2.2 mmol/L (ref 0.5–2.2)

## 2014-06-30 LAB — MRSA PCR SCREENING: MRSA by PCR: NEGATIVE

## 2014-06-30 MED ORDER — SODIUM CHLORIDE 0.9 % IV BOLUS (SEPSIS)
2000.0000 mL | Freq: Once | INTRAVENOUS | Status: AC
  Administered 2014-06-30: 2000 mL via INTRAVENOUS

## 2014-06-30 MED ORDER — ALBUTEROL (5 MG/ML) CONTINUOUS INHALATION SOLN
10.0000 mg/h | INHALATION_SOLUTION | RESPIRATORY_TRACT | Status: DC
  Administered 2014-06-30: 10 mg/h via RESPIRATORY_TRACT
  Filled 2014-06-30: qty 20

## 2014-06-30 MED ORDER — CALCIUM GLUCONATE 10 % IV SOLN
1.0000 g | INTRAVENOUS | Status: AC
  Administered 2014-06-30: 1 g via INTRAVENOUS
  Filled 2014-06-30 (×2): qty 10

## 2014-06-30 MED ORDER — SODIUM BICARBONATE 8.4 % IV SOLN
50.0000 meq | Freq: Once | INTRAVENOUS | Status: AC
  Administered 2014-06-30: 50 meq via INTRAVENOUS
  Filled 2014-06-30: qty 50

## 2014-06-30 MED ORDER — ALBUTEROL SULFATE (2.5 MG/3ML) 0.083% IN NEBU: 10.0000 mg | INHALATION_SOLUTION | Freq: Once | RESPIRATORY_TRACT | Status: DC

## 2014-06-30 MED ORDER — SODIUM CHLORIDE 0.9 % IV BOLUS (SEPSIS)
1000.0000 mL | Freq: Once | INTRAVENOUS | Status: AC
  Administered 2014-06-30: 1000 mL via INTRAVENOUS

## 2014-06-30 MED ORDER — SODIUM CHLORIDE 0.9 % IV SOLN
1.0000 g | INTRAVENOUS | Status: DC
  Filled 2014-06-30: qty 10

## 2014-06-30 MED ORDER — DEXTROSE-NACL 5-0.45 % IV SOLN
INTRAVENOUS | Status: DC
  Administered 2014-07-01: 01:00:00 via INTRAVENOUS

## 2014-06-30 MED ORDER — SODIUM CHLORIDE 0.9 % IV SOLN
INTRAVENOUS | Status: DC
  Filled 2014-06-30: qty 2.5

## 2014-06-30 MED ORDER — ENOXAPARIN SODIUM 40 MG/0.4ML ~~LOC~~ SOLN
40.0000 mg | SUBCUTANEOUS | Status: DC
  Administered 2014-06-30 – 2014-07-02 (×3): 40 mg via SUBCUTANEOUS
  Filled 2014-06-30 (×4): qty 0.4

## 2014-06-30 MED ORDER — DEXTROSE 50 % IV SOLN: 25.0000 mL | INTRAVENOUS | Status: DC | PRN

## 2014-06-30 MED ORDER — SODIUM CHLORIDE 0.9 % IV SOLN
INTRAVENOUS | Status: DC
  Administered 2014-06-30: 5.4 [IU]/h via INTRAVENOUS
  Filled 2014-06-30: qty 2.5

## 2014-06-30 MED ORDER — SODIUM CHLORIDE 0.9 % IV SOLN
INTRAVENOUS | Status: DC
  Administered 2014-06-30: 125 mL/h via INTRAVENOUS

## 2014-06-30 NOTE — ED Notes (Signed)
ATTEMPTED TO CALL REPORT

## 2014-06-30 NOTE — ED Notes (Signed)
LAB TECH DRAWING BLOOD

## 2014-06-30 NOTE — H&P (Signed)
PULMONARY / CRITICAL CARE MEDICINE HISTORY AND PHYSICAL EXAMINATION   Name: Jared Murray MRN: [id number] DOB: November 04, 1975    ADMISSION DATE:  06/30/2014  PRIMARY SERVICE: PCCM  CHIEF COMPLAINT:  Dehydration  BRIEF PATIENT DESCRIPTION: 38 y/o man w/ hx of DM1 on insulin, w/ two day hx of nausea, fatigue, anorexia, and DKA  SIGNIFICANT EVENTS / STUDIES:  Started on DKA protocol, got Bicarb in ED for pH < 7.0 and K > 6.0  LINES / TUBES: PIVs x2  CULTURES: Blood 11/13 - ordered Urine 11/13 - ordered  ANTIBIOTICS: None  HISTORY OF PRESENT ILLNESS:   Jared Murray is a 38 y/o man with a history of DM1 as well as psoriasis and EtOH abuse who presents to the ED with two days of nausea, fatigue, anorexia, constipation, and eventual vomiting. He stopped taking his insulin two days ago when he stopped eating, and progressively felt worse as time progressed. He denies fevers, dysuria, productive cough, chest pain (although he did report flank pain upon arrival). He was dyspneic, and so EMS was called. In the ED was found to have severe DKA  w/ pH < 7.0, lactic acidosis to 4.5, hyperkalemia. He was treated w/ one amp of Bicarb, albuterol, and PCCM was called for admission.  PAST MEDICAL HISTORY :  Past Medical History  Diagnosis Date  . Eczema   . Type 1 diabetes mellitus   . Psoriasis    Past Surgical History  Procedure Laterality Date  . Finger surgery     Prior to Admission medications   Medication Sig Start Date End Date Taking? Authorizing Provider  ibuprofen (ADVIL,MOTRIN) 600 MG tablet Take 1 tablet (600 mg total) by mouth every 8 (eight) hours as needed. 05/10/14  Yes Deepak Advani, MD  insulin glargine (LANTUS) 100 UNIT/ML injection Inject 25 Units into the skin daily.   Yes Historical Provider, MD  Insulin Syringe-Needle U-100 (B-D INS SYRINGE 2CC/29GX1/2") 29G X 1/2" 2 ML MISC Check Blood sugar TID and QHS 03/22/14  Yes Olugbemiga E Jegede, MD  NOVOLOG 100 UNIT/ML injection  Sliding scale CBG 70 - 120: 0 units CBG 121 - 150: 1 unit,  CBG 151 - 200: 2 units,  CBG 201 - 250: 3 units,  CBG 251 - 300: 5 units,  CBG 301 - 350: 7 units,  CBG 351 - 400: 9 units   CBG > 400: 10 units 06/15/13  Yes Ripudeep Krystal Eaton, MD   Allergies  Allergen Reactions  . Penicillins     Childhood allergy    FAMILY HISTORY:  Family History  Problem Relation Age of Onset  . Hypertension Father   . Diabetes      multiple  . Lupus Cousin   . Stroke Maternal Grandmother   . Stroke Paternal Grandmother    SOCIAL HISTORY:  reports that he has been smoking.  He does not have any smokeless tobacco history on file. He reports that he drinks alcohol. He reports that he uses illicit drugs (Marijuana).  REVIEW OF SYSTEMS:  All systems negative except as noted in the HPI.  SUBJECTIVE:   VITAL SIGNS: Temp:  [99 F (37.2 C)] 99 F (37.2 C) (11/13 1716) Pulse Rate:  [102-128] 122 (11/13 1945) Resp:  [18-29] 20 (11/13 1945) BP: (133-171)/(68-100) 171/82 mmHg (11/13 1945) SpO2:  [98 %-100 %] 99 % (11/13 1945) Weight:  [150 lb (68.04 kg)] 150 lb (68.04 kg) (11/13 1716) HEMODYNAMICS: Normotensive.   VENTILATOR SETTINGS:   INTAKE / OUTPUT: Intake/Output  11/13 0701 - 11/14 0700   I.V. (mL/kg) 1000 (14.7)   Total Intake(mL/kg) 1000 (14.7)   Net +1000         PHYSICAL EXAMINATION: General:  Thin man in moderate respiratory distress Neuro:  Somewhat sluggish in responses, but A&O x3 HEENT:  Dry mucus membranes, poor dentition, crusted secretions at the corners of his eyes Neck: Trachea midline, no LAD Cardiovascular:  Tachycardic rate, no M/R/G Lungs:  CTAB Abdomen:  Active bowel sounds w/ "pinging" quality. Musculoskeletal:  No deformities. Skin:  Dry, scaly scattered psoratic rash, notable on forehead.  LABS:  CBC  Recent Labs Lab 06/30/14 1755  WBC 27.7*  HGB 14.3  HCT 44.5  PLT 391   Coag's No results for input(s): APTT, INR in the last 168  hours. BMET  Recent Labs Lab 06/30/14 1755  NA 134*  K 6.7*  CL 84*  CO2 <7*  BUN 24*  CREATININE 1.29  GLUCOSE 710*   Electrolytes  Recent Labs Lab 06/30/14 1755  CALCIUM 9.4   Sepsis Markers  Recent Labs Lab 06/30/14 1759  LATICACIDVEN 4.25*   ABG No results for input(s): PHART, PCO2ART, PO2ART in the last 168 hours. Liver Enzymes  Recent Labs Lab 06/30/14 1755  AST 27  ALT 16  ALKPHOS 175*  BILITOT 0.5  ALBUMIN 4.5   Cardiac Enzymes  Recent Labs Lab 06/30/14 1755  TROPONINI <0.30   Glucose  Recent Labs Lab 06/30/14 1723 06/30/14 1949  GLUCAP >600* 549*    Imaging Dg Chest Portable 1 View  06/30/2014   CLINICAL DATA:  Initial evaluation for diabetic ketoacidosis, patient noncompliance with insulin, patient complaining of nausea vomiting diarrhea and dehydration shortness of breath chest pain dry mouth, patient smokes  EXAM: PORTABLE CHEST - 1 VIEW  COMPARISON:  01/23/2014  FINDINGS: The heart size and mediastinal contours are within normal limits. Both lungs are clear. The visualized skeletal structures are unremarkable.  IMPRESSION: No active disease.   Electronically Signed   By: Skipper Cliche M.D.   On: 06/30/2014 18:40    EKG: Unchanged RBBB,peaked T-waves (compariable to prior, although had similar presenstation at last EKG). CXR: Clear, no infiltrate  ASSESSMENT / PLAN:  Active Problems:   DKA (diabetic ketoacidoses)  PULMONARY A: Respiratory Acidosis in setting of severe metabolic acidosis: P:   Only mild: expected PCO2 is 17-21, PCO2 is 24. Likely due to mild fatigue in setting of severe DKA and high minute ventilation demands.  CARDIOVASCULAR A: Tachycardia w/ RBBB P:   Tachycardia due to hypovolemia. EKG Appears unchanged from prior, unclear if has had cardiac / coronary event in the past. Will monitor for s/sx of MI or other cardiac event. Will cycle troponins.  RENAL A: AKI due to hypovolemia in DKA Hyperkalemia in  DKA P:   Hydrate w/ 4L of NS on admission, will re-eval chemistires after this. Expect significant drop in K w/ correction of DKA / acidosis / hydration. Will monitor for rebound hypokalemia  GASTROINTESTINAL A: Nausea, vomiting, Abnormal bowel sounds P:   Symptoms likely due to DKA; will obtain abdominal xray to assess for gas pattern.  HEMATOLOGIC A: Leukocytosis P:   Likely reactive due to DKA  INFECTIOUS A: DKA - unclear trigger - leukocytosis P:   Will obtain blood and urine cultures; suspicion for significant infection is low, but will send surveillance studies.  ENDOCRINE A: Severe DKA w/ lactic acidosis P:   S/p 4L NS bolus; placed on DKA protocol, will re-eval need for further  bolus after repeat labs. NPO and DKA protocol.  NEUROLOGIC A: Hx of EtOH abuse P:   Clear mental status now, but will order CIWA (no benzos ordered, please call MD).  BEST PRACTICE / DISPOSITION Level of Care:  ICU Primary Service:  PCCM Consultants:  None Code Status:  Full Diet:  NPO DVT Px:  Enoxaparin GI Px:  None indicated Skin Integrity:  Intact Social / Family:  Patient A&O x4  TODAY'S SUMMARY: 38 y/o man w/ hx of DM1 admitted w/ DKA  I have personally obtained a history, examined the patient, evaluated laboratory and imaging results, formulated the assessment and plan and placed orders.  CRITICAL CARE: The patient is critically ill with multiple organ systems failure and requires high complexity decision making for assessment and support, frequent evaluation and titration of therapies, application of advanced monitoring technologies and extensive interpretation of multiple databases. Critical Care Time devoted to patient care services described in this note is 75 minutes.   Luz Brazen, MD [facility] [place] Pager: [phone]   06/30/2014, 8:04 PM

## 2014-06-30 NOTE — ED Notes (Signed)
PORT ABD XRAY DONE

## 2014-06-30 NOTE — ED Notes (Signed)
Called pharmacy to send calcium gluconate

## 2014-06-30 NOTE — ED Notes (Signed)
CBG read HIGH, PA notified along with RN

## 2014-06-30 NOTE — Progress Notes (Signed)
Stopped CAT albuterol treatment due to increased HR 130-140s. Pt wanted to stop.

## 2014-06-30 NOTE — Progress Notes (Signed)
Started 10 mg CAT on patient. Pt did not tolerate the mask stated he was claustrophobic. Switched to a handheld mouth piece. Tolerating well

## 2014-06-30 NOTE — ED Provider Notes (Signed)
CSN: [id number]     Arrival date & time 06/30/14  1659 History   First MD Initiated Contact with Patient 06/30/14 1701     Chief Complaint  Patient presents with  . Hyperglycemia     (Consider location/radiation/quality/duration/timing/severity/associated sxs/prior Treatment) HPI Comments: This is a 38 year old male with a past medical history of type 1 diabetes who presents to the emergency department complaining of fatigue, dry mouth, shortness of breath, chest pain, cough, nausea and vomiting over the past 2 days. He states he has been so tired he has not been able to take his medications. He has been off of his insulin for the past 2 days. Admits to increased thirst. Cough is nonproductive, however his chest pain feels like when he had pneumonia in the past. He endorses generalized mild abdominal pain and has a decreased appetite. States he feels dehydrated.  Patient is a 38 y.o. male presenting with hyperglycemia. The history is provided by the patient.  Hyperglycemia Associated symptoms: abdominal pain, chest pain, fatigue, increased thirst, nausea, shortness of breath and vomiting     Past Medical History  Diagnosis Date  . Eczema   . Type 1 diabetes mellitus   . Psoriasis    Past Surgical History  Procedure Laterality Date  . Finger surgery     Family History  Problem Relation Age of Onset  . Hypertension Father   . Diabetes      multiple  . Lupus Cousin   . Stroke Maternal Grandmother   . Stroke Paternal Grandmother    History  Substance Use Topics  . Smoking status: Current Every Day Smoker  . Smokeless tobacco: Not on file  . Alcohol Use: Yes     Comment: recently quit - 7/14     Review of Systems  Constitutional: Positive for chills, appetite change and fatigue.  Respiratory: Positive for cough and shortness of breath.   Cardiovascular: Positive for chest pain.  Gastrointestinal: Positive for nausea, vomiting and abdominal pain.  Endocrine: Positive for  polydipsia.  All other systems reviewed and are negative.     Allergies  Penicillins  Home Medications   Prior to Admission medications   Medication Sig Start Date End Date Taking? Authorizing Provider  ibuprofen (ADVIL,MOTRIN) 600 MG tablet Take 1 tablet (600 mg total) by mouth every 8 (eight) hours as needed. 05/10/14  Yes Deepak Advani, MD  insulin glargine (LANTUS) 100 UNIT/ML injection Inject 25 Units into the skin daily.   Yes Historical Provider, MD  Insulin Syringe-Needle U-100 (B-D INS SYRINGE 2CC/29GX1/2") 29G X 1/2" 2 ML MISC Check Blood sugar TID and QHS 03/22/14  Yes Olugbemiga E Jegede, MD  NOVOLOG 100 UNIT/ML injection Sliding scale CBG 70 - 120: 0 units CBG 121 - 150: 1 unit,  CBG 151 - 200: 2 units,  CBG 201 - 250: 3 units,  CBG 251 - 300: 5 units,  CBG 301 - 350: 7 units,  CBG 351 - 400: 9 units   CBG > 400: 10 units 06/15/13  Yes Ripudeep K Rai, MD   BP 133/68 mmHg  Pulse 102  Temp(Src) 99 F (37.2 C) (Oral)  Resp 21  Ht 5\' 10"  (1.778 m)  Wt 150 lb (68.04 kg)  BMI 21.52 kg/m2  SpO2 100% Physical Exam  Constitutional: He is oriented to person, place, and time. He appears well-developed and well-nourished. He appears distressed.  HENT:  Head: Normocephalic and atraumatic.  Mouth/Throat: Oropharynx is clear and moist.  Eyes: Conjunctivae and EOM are  normal. Pupils are equal, round, and reactive to light.  Neck: Normal range of motion. Neck supple.  Cardiovascular: Regular rhythm, normal heart sounds and intact distal pulses.   Tachycardic.  Pulmonary/Chest: Effort normal and breath sounds normal. He has no wheezes. He has no rales.  Kussmaul breathing.  Abdominal: Soft. Bowel sounds are normal.  Mild generalized tenderness. No rigidity, rebound or guarding. No peritoneal signs.  Musculoskeletal: Normal range of motion. He exhibits no edema.  Neurological: He is alert and oriented to person, place, and time.  Skin: Skin is warm and dry. He is not diaphoretic.   Psychiatric: He has a normal mood and affect. His behavior is normal.  Nursing note and vitals reviewed.   ED Course  Procedures (including critical care time) CRITICAL CARE Performed by: Lucien Mons   Total critical care time: 60 minutes  Critical care time was exclusive of separately billable procedures and treating other patients.  Critical care was necessary to treat or prevent imminent or life-threatening deterioration.  Critical care was time spent personally by me on the following activities: development of treatment plan with patient and/or surrogate as well as nursing, discussions with consultants, evaluation of patient's response to treatment, examination of patient, obtaining history from patient or surrogate, ordering and performing treatments and interventions, ordering and review of laboratory studies, ordering and review of radiographic studies, pulse oximetry and re-evaluation of patient's condition.  Labs Review Labs Reviewed  CBC - Abnormal; Notable for the following:    WBC 27.7 (*)    All other components within normal limits  COMPREHENSIVE METABOLIC PANEL - Abnormal; Notable for the following:    Sodium 134 (*)    Potassium 6.7 (*)    Chloride 84 (*)    CO2 <7 (*)    Glucose, Bld 710 (*)    BUN 24 (*)    Total Protein 8.9 (*)    Alkaline Phosphatase 175 (*)    GFR calc non Af Amer 69 (*)    GFR calc Af Amer 80 (*)    All other components within normal limits  KETONES, QUALITATIVE - Abnormal; Notable for the following:    Acetone, Bld SMALL (*)    All other components within normal limits  LIPASE, BLOOD - Abnormal; Notable for the following:    Lipase 193 (*)    All other components within normal limits  I-STAT CG4 LACTIC ACID, ED - Abnormal; Notable for the following:    Lactic Acid, Venous 4.25 (*)    All other components within normal limits  CBG MONITORING, ED - Abnormal; Notable for the following:    Glucose-Capillary >600 (*)    All other  components within normal limits  I-STAT VENOUS BLOOD GAS, ED - Abnormal; Notable for the following:    pH, Ven 6.928 (*)    pCO2, Ven 24.3 (*)    pO2, Ven 49.0 (*)    Bicarbonate 5.1 (*)    Acid-base deficit 27.0 (*)    All other components within normal limits  TROPONIN I  URINALYSIS, ROUTINE W REFLEX MICROSCOPIC  BASIC METABOLIC PANEL  LACTIC ACID, PLASMA  TROPONIN I  MAGNESIUM  PHOSPHORUS  I-STAT CHEM 8, ED    Imaging Review Dg Chest Portable 1 View  06/30/2014   CLINICAL DATA:  Initial evaluation for diabetic ketoacidosis, patient noncompliance with insulin, patient complaining of nausea vomiting diarrhea and dehydration shortness of breath chest pain dry mouth, patient smokes  EXAM: PORTABLE CHEST - 1 VIEW  COMPARISON:  01/23/2014  FINDINGS: The heart size and mediastinal contours are within normal limits. Both lungs are clear. The visualized skeletal structures are unremarkable.  IMPRESSION: No active disease.   Electronically Signed   By: Skipper Cliche M.D.   On: 06/30/2014 18:40     EKG Interpretation None      MDM   Final diagnoses:  Chest pain  DKA (diabetic ketoacidoses)   Patient in DKA. He appears in distress and has Coombs small respirations. Afebrile, tachycardic and tachypneic. He is acidotic, hyperkalemic, CO2 undetectable on CMP. With CO2 based off of chem 8, anion gap is around 45. Potassium 6.7, he does have peaked T waves on EKG. IV insulin started via glucostabilizer. Patient also received 1 amp of bicarbonate, calcium gluconate and IV fluids. Critical care consulted who will admit pt.  Discussed with attending Dr. Regenia Skeeter who also evaluated patient and agrees with plan of care.   Carman Ching, PA-C 07/01/14 [place], MD 07/01/14 [phone]

## 2014-06-30 NOTE — ED Notes (Signed)
Pt states has not taken his insulin states he has been sleeping a lot and would forget pt with co dry mouth nausea vomiting "pain all over"

## 2014-07-01 DIAGNOSIS — A084 Viral intestinal infection, unspecified: Secondary | ICD-10-CM

## 2014-07-01 DIAGNOSIS — E1010 Type 1 diabetes mellitus with ketoacidosis without coma: Principal | ICD-10-CM

## 2014-07-01 LAB — GLUCOSE, CAPILLARY
GLUCOSE-CAPILLARY: 115 mg/dL — AB (ref 70–99)
GLUCOSE-CAPILLARY: 203 mg/dL — AB (ref 70–99)
GLUCOSE-CAPILLARY: 235 mg/dL — AB (ref 70–99)
GLUCOSE-CAPILLARY: 289 mg/dL — AB (ref 70–99)
GLUCOSE-CAPILLARY: 291 mg/dL — AB (ref 70–99)
GLUCOSE-CAPILLARY: 366 mg/dL — AB (ref 70–99)
GLUCOSE-CAPILLARY: 76 mg/dL (ref 70–99)
GLUCOSE-CAPILLARY: 93 mg/dL (ref 70–99)
GLUCOSE-CAPILLARY: 96 mg/dL (ref 70–99)
Glucose-Capillary: 441 mg/dL — ABNORMAL HIGH (ref 70–99)
Glucose-Capillary: 230 mg/dL — ABNORMAL HIGH (ref 70–99)
Glucose-Capillary: 205 mg/dL — ABNORMAL HIGH (ref 70–99)
Glucose-Capillary: 184 mg/dL — ABNORMAL HIGH (ref 70–99)
Glucose-Capillary: 158 mg/dL — ABNORMAL HIGH (ref 70–99)
Glucose-Capillary: 104 mg/dL — ABNORMAL HIGH (ref 70–99)
Glucose-Capillary: 93 mg/dL (ref 70–99)
Glucose-Capillary: 99 mg/dL (ref 70–99)
Glucose-Capillary: 156 mg/dL — ABNORMAL HIGH (ref 70–99)
Glucose-Capillary: 230 mg/dL — ABNORMAL HIGH (ref 70–99)
Glucose-Capillary: 274 mg/dL — ABNORMAL HIGH (ref 70–99)
Glucose-Capillary: 137 mg/dL — ABNORMAL HIGH (ref 70–99)

## 2014-07-01 LAB — BASIC METABOLIC PANEL
ANION GAP: 14 (ref 5–15)
Anion gap: 14 (ref 5–15)
Anion gap: 22 — ABNORMAL HIGH (ref 5–15)
Anion gap: 30 — ABNORMAL HIGH (ref 5–15)
BUN: 17 mg/dL (ref 6–23)
BUN: 12 mg/dL (ref 6–23)
BUN: 8 mg/dL (ref 6–23)
BUN: 20 mg/dL (ref 6–23)
CALCIUM: 8.3 mg/dL — AB (ref 8.4–10.5)
CALCIUM: 8.5 mg/dL (ref 8.4–10.5)
CHLORIDE: 107 meq/L (ref 96–112)
CO2: 19 meq/L (ref 19–32)
CO2: 18 mEq/L — ABNORMAL LOW (ref 19–32)
CO2: 10 meq/L — AB (ref 19–32)
CO2: 12 meq/L — AB (ref 19–32)
CREATININE: 0.56 mg/dL (ref 0.50–1.35)
CREATININE: 0.84 mg/dL (ref 0.50–1.35)
CREATININE: 1.08 mg/dL (ref 0.50–1.35)
Calcium: 8.4 mg/dL (ref 8.4–10.5)
Calcium: 8.5 mg/dL (ref 8.4–10.5)
Chloride: 100 mEq/L (ref 96–112)
Chloride: 105 mEq/L (ref 96–112)
Chloride: 108 mEq/L (ref 96–112)
Creatinine, Ser: 0.61 mg/dL (ref 0.50–1.35)
GFR calc Af Amer: >90 mL/min (ref >90)
GFR calc Af Amer: >90 mL/min (ref >90)
GFR calc Af Amer: >90 mL/min (ref >90)
GFR calc Af Amer: >90 mL/min (ref >90)
GFR calc non Af Amer: 86 mL/min — ABNORMAL LOW (ref >90)
GFR calc non Af Amer: >90 mL/min (ref >90)
GFR calc non Af Amer: >90 mL/min (ref >90)
GFR calc non Af Amer: >90 mL/min (ref >90)
GLUCOSE: 94 mg/dL (ref 70–99)
GLUCOSE: 225 mg/dL — AB (ref 70–99)
GLUCOSE: 272 mg/dL — AB (ref 70–99)
Glucose, Bld: 143 mg/dL — ABNORMAL HIGH (ref 70–99)
POTASSIUM: 3.4 meq/L — AB (ref 3.7–5.3)
Potassium: 3.7 mEq/L (ref 3.7–5.3)
Potassium: 4.2 mEq/L (ref 3.7–5.3)
Potassium: 5.1 mEq/L (ref 3.7–5.3)
Sodium: 132 mEq/L — ABNORMAL LOW (ref 137–147)
Sodium: 140 mEq/L (ref 137–147)
Sodium: 142 mEq/L (ref 137–147)
Sodium: 145 mEq/L (ref 137–147)

## 2014-07-01 LAB — TROPONIN I

## 2014-07-01 MED ORDER — GUAIFENESIN 200 MG PO TABS
200.0000 mg | ORAL_TABLET | Freq: Four times a day (QID) | ORAL | Status: DC | PRN
  Administered 2014-07-01: 200 mg via ORAL
  Filled 2014-07-01 (×2): qty 1

## 2014-07-01 MED ORDER — SALINE SPRAY 0.65 % NA SOLN
1.0000 | NASAL | Status: DC | PRN
  Administered 2014-07-01: 1 via NASAL
  Filled 2014-07-01: qty 44

## 2014-07-01 MED ORDER — INSULIN ASPART 100 UNIT/ML ~~LOC~~ SOLN
0.0000 [IU] | Freq: Every day | SUBCUTANEOUS | Status: DC
  Administered 2014-07-01 – 2014-07-02 (×2): 2 [IU] via SUBCUTANEOUS

## 2014-07-01 MED ORDER — INSULIN ASPART 100 UNIT/ML ~~LOC~~ SOLN
0.0000 [IU] | Freq: Three times a day (TID) | SUBCUTANEOUS | Status: DC
  Administered 2014-07-01: 8 [IU] via SUBCUTANEOUS
  Administered 2014-07-02 – 2014-07-03 (×4): 3 [IU] via SUBCUTANEOUS

## 2014-07-01 MED ORDER — INSULIN GLARGINE 100 UNIT/ML ~~LOC~~ SOLN
25.0000 [IU] | Freq: Every day | SUBCUTANEOUS | Status: DC
  Administered 2014-07-01 – 2014-07-03 (×3): 25 [IU] via SUBCUTANEOUS
  Filled 2014-07-01 (×4): qty 0.25

## 2014-07-01 NOTE — Plan of Care (Signed)
Problem: Consults Goal: Skin Care Protocol Initiated - if Braden Score 18 or less If consults are not indicated, leave blank or document N/A Outcome: Not Applicable Date Met:  16/10/96 Goal: Nutrition Consult-if indicated Outcome: Not Applicable Date Met:  04/54/09 Goal: Diabetes Guidelines if Diabetic/Glucose > 140 If diabetic or lab glucose is > 140 mg/dl - Initiate Diabetes/Hyperglycemia Guidelines & Document Interventions  Outcome: Completed/Met Date Met:  07/01/14  Problem: Phase I Progression Outcomes Goal: CBGs steadily decreasing on IV insulin drip Outcome: Completed/Met Date Met:  07/01/14 Goal: Monitor hydration status Outcome: Completed/Met Date Met:  07/01/14 Goal: Acidosis resolving Outcome: Completed/Met Date Met:  07/01/14 Goal: NPO or per MD order Outcome: Completed/Met Date Met:  07/01/14 Goal: K+ level approaching normal with therapy Outcome: Completed/Met Date Met:  07/01/14 Goal: Nausea/vomiting controlled with antiemetics Outcome: Completed/Met Date Met:  07/01/14 Goal: Pain controlled with appropriate interventions Outcome: Not Applicable Date Met:  81/19/14 Goal: Initial discharge plan identified Outcome: Completed/Met Date Met:  07/01/14 Goal: Voiding-avoid urinary catheter unless indicated Outcome: Completed/Met Date Met:  07/01/14 Goal: Pt. states reason for hospitalization Outcome: Completed/Met Date Met:  07/01/14  Problem: Phase II Progression Outcomes Goal: Tolerating PO clear liquid diet Outcome: Completed/Met Date Met:  07/01/14 Goal: Potassium level normalizing Outcome: Completed/Met Date Met:  07/01/14 Goal: Nausea & vomiting resolved Outcome: Completed/Met Date Met:  07/01/14

## 2014-07-01 NOTE — H&P (Signed)
PULMONARY / CRITICAL CARE MEDICINE HISTORY AND PHYSICAL EXAMINATION   Name: Jared Murray MRN: [id number] DOB: 1975-11-01    ADMISSION DATE:  06/30/2014  PRIMARY SERVICE: PCCM  CHIEF COMPLAINT:  Dehydration  BRIEF PATIENT DESCRIPTION: 38 y/o man w/ hx of DM1 on insulin, w/ two day hx of nausea, fatigue, anorexia, and DKA  SIGNIFICANT EVENTS / STUDIES:  Started on DKA protocol, got Bicarb in ED for pH < 7.0 and K > 6.0  LINES / TUBES: PIVs x2  CULTURES: Blood 11/13 >> Urine 11/13 >>  ANTIBIOTICS: None  SUBJECTIVE:  Improving, alert and hungry No  N/v  BS tr down  K+ improved , gap almost closed   VITAL SIGNS: Temp:  [97.8 F (36.6 C)-99 F (37.2 C)] 98.1 F (36.7 C) (11/14 0741) Pulse Rate:  [89-128] 89 (11/14 0900) Resp:  [11-29] 28 (11/14 0900) BP: (102-171)/(62-100) 110/73 mmHg (11/14 0900) SpO2:  [96 %-100 %] 98 % (11/14 0900) Weight:  [68.04 kg (150 lb)] 68.04 kg (150 lb) (11/13 1716) HEMODYNAMICS: Normotensive.   VENTILATOR SETTINGS:   INTAKE / OUTPUT: Intake/Output      11/13 0701 - 11/14 0700 11/14 0701 - 11/15 0700   I.V. (mL/kg) 2071.1 (30.4) 208.4 (3.1)   Total Intake(mL/kg) 2071.1 (30.4) 208.4 (3.1)   Urine (mL/kg/hr) 2550    Total Output 2550     Net -478.9 +208.4          PHYSICAL EXAMINATION: General:  Thin man Neuro:  Alert and appropriate  HEENT:  Dry mucus membranes, poor dentition Neck: Trachea midline, no LAD Cardiovascular:  Tachycardic rate, no M/R/G Lungs:  CTAB Abdomen:  Active bowel sounds  Musculoskeletal:  No deformities. Skin:  Dry, scaly scattered psoratic rash, notable on forehead.  LABS:  CBC  Recent Labs Lab 06/30/14 1755 06/30/14 2257  WBC 27.7* 21.3*  HGB 14.3 13.1  HCT 44.5 39.4  PLT 391 329   Coag's No results for input(s): APTT, INR in the last 168 hours. BMET  Recent Labs Lab 06/30/14 2331 07/01/14 0136 07/01/14 0920  NA 145 142 140  K 5.1 4.2 3.7  CL 105 108 107  CO2 10* 12* 19  BUN  20 17 12   CREATININE 1.08 0.84 0.61  GLUCOSE 272* 225* 94   Electrolytes  Recent Labs Lab 06/30/14 2042  06/30/14 2331 07/01/14 0136 07/01/14 0920  CALCIUM 7.9*  < > 8.5 8.4 8.3*  MG 2.4  --   --   --   --   PHOS 5.6*  --   --   --   --   < > = values in this interval not displayed. Sepsis Markers  Recent Labs Lab 06/30/14 1759 06/30/14 2042  LATICACIDVEN 4.25* 2.2   ABG No results for input(s): PHART, PCO2ART, PO2ART in the last 168 hours. Liver Enzymes  Recent Labs Lab 06/30/14 1755  AST 27  ALT 16  ALKPHOS 175*  BILITOT 0.5  ALBUMIN 4.5   Cardiac Enzymes  Recent Labs Lab 06/30/14 1755 06/30/14 2331  TROPONINI <0.30 <0.30   Glucose  Recent Labs Lab 07/01/14 0210 07/01/14 0314 07/01/14 0422 07/01/14 0526 07/01/14 0636 07/01/14 0932  GLUCAP 203* 205* 184* 158* 104* 93    Imaging Dg Chest Portable 1 View  06/30/2014   CLINICAL DATA:  Initial evaluation for diabetic ketoacidosis, patient noncompliance with insulin, patient complaining of nausea vomiting diarrhea and dehydration shortness of breath chest pain dry mouth, patient smokes  EXAM: PORTABLE CHEST - 1 VIEW  COMPARISON:  01/23/2014  FINDINGS: The heart size and mediastinal contours are within normal limits. Both lungs are clear. The visualized skeletal structures are unremarkable.  IMPRESSION: No active disease.   Electronically Signed   By: Skipper Cliche M.D.   On: 06/30/2014 18:40   Dg Abd Portable 1v  06/30/2014   CLINICAL DATA:  Initial evaluation for acute abdominal pain and vomiting for 1 day  EXAM: PORTABLE ABDOMEN - 1 VIEW  COMPARISON:  None.  FINDINGS: Moderate gaseous gastric distention. No other abnormally dilated loops of bowel. No abnormal opacities over the abdomen or pelvis. Markedly severe bilateral hip arthritis.  IMPRESSION: Gastric distention  Markedly severe hip arthritis.   Electronically Signed   By: Skipper Cliche M.D.   On: 06/30/2014 20:55    EKG: Unchanged  RBBB,peaked T-waves (compariable to prior, although had similar presenstation at last EKG). CXR: Clear, no infiltrate  ASSESSMENT / PLAN:  Active Problems:   DKA (diabetic ketoacidoses)  PULMONARY A: Respiratory Acidosis in setting of severe metabolic acidosis:>resolving  P:   Mobilize when able  O2 for sat >90%   CARDIOVASCULAR A: Tachycardia w/ RBBB>improved  P:   Monitor   RENAL A: AKI due to hypovolemia in DKA>resolved  Hyperkalemia in DKA>resolved  P:   Recheck bmet at 1600  Check bmet in am  Replace electrolytes as indicated   GASTROINTESTINAL A: Nausea, vomiting, Abnormal bowel sounds>resolved  P:   Begin clear liquid diet , advance as tolerated   HEMATOLOGIC A: Leukocytosis P:   Likely reactive due to DKA Check cbc in am   INFECTIOUS A: DKA - unclear trigger - leukocytosis P:   Follow cx data   ENDOCRINE A: Severe DKA w/ lactic acidosis>improved , gap near closing  P:    transition to SSI    NEUROLOGIC A: Hx of EtOH abuse>no w/d noted  P:   montior   BEST PRACTICE / DISPOSITION Level of Care:  ICU Primary Service:  PCCM Consultants:  None Code Status:  Full Diet:  Advance  DVT Px:  Enoxaparin GI Px:  None indicated Skin Integrity:  Intact Social / Family:  Patient A&O x4  TODAY'S SUMMARY: 38 y/o man w/ hx of DM1 admitted w/ DKA, improving with IVF and Insulin drip . Gap near closed . Transition to SSI, transfer to floor to [facility] .    Tammy Parrett NP-C  [facility]  [phone]   07/01/2014, 10:13 AM    Attending:  I have seen and examined the patient with nurse practitioner/resident and agree with the note above.   Jared Murray looks great His DKA has resolved, it sounds like it was due to a viral gastroenteritis Move to floor, TRH service  Roselie Awkward, MD [place] Pager: [phone] Cell: [phone] If no response, call [phone]

## 2014-07-02 DIAGNOSIS — E876 Hypokalemia: Secondary | ICD-10-CM

## 2014-07-02 LAB — GLUCOSE, CAPILLARY
GLUCOSE-CAPILLARY: 153 mg/dL — AB (ref 70–99)
GLUCOSE-CAPILLARY: 184 mg/dL — AB (ref 70–99)
GLUCOSE-CAPILLARY: 101 mg/dL — AB (ref 70–99)
GLUCOSE-CAPILLARY: 217 mg/dL — AB (ref 70–99)
Glucose-Capillary: 190 mg/dL — ABNORMAL HIGH (ref 70–99)

## 2014-07-02 LAB — BASIC METABOLIC PANEL
ANION GAP: 12 (ref 5–15)
BUN: 5 mg/dL — AB (ref 6–23)
CHLORIDE: 102 meq/L (ref 96–112)
CO2: 22 mEq/L (ref 19–32)
Calcium: 8.4 mg/dL (ref 8.4–10.5)
Creatinine, Ser: 0.5 mg/dL (ref 0.50–1.35)
GFR calc non Af Amer: >90 mL/min (ref >90)
Glucose, Bld: 180 mg/dL — ABNORMAL HIGH (ref 70–99)
Potassium: 3.1 mEq/L — ABNORMAL LOW (ref 3.7–5.3)
Sodium: 136 mEq/L — ABNORMAL LOW (ref 137–147)

## 2014-07-02 MED ORDER — SODIUM CHLORIDE 0.9 % IV SOLN
INTRAVENOUS | Status: DC
  Administered 2014-07-02: 15:00:00 via INTRAVENOUS

## 2014-07-02 MED ORDER — PANTOPRAZOLE SODIUM 40 MG PO TBEC
40.0000 mg | DELAYED_RELEASE_TABLET | Freq: Every day | ORAL | Status: DC
  Administered 2014-07-02 – 2014-07-03 (×2): 40 mg via ORAL
  Filled 2014-07-02: qty 1

## 2014-07-02 MED ORDER — POTASSIUM CHLORIDE CRYS ER 20 MEQ PO TBCR
40.0000 meq | EXTENDED_RELEASE_TABLET | Freq: Once | ORAL | Status: AC
  Administered 2014-07-02: 40 meq via ORAL
  Filled 2014-07-02: qty 2

## 2014-07-02 MED ORDER — POTASSIUM CHLORIDE IN NACL 40-0.9 MEQ/L-% IV SOLN
INTRAVENOUS | Status: AC
  Administered 2014-07-02 (×2): 100 mL/h via INTRAVENOUS
  Filled 2014-07-02: qty 1000

## 2014-07-02 MED ORDER — GI COCKTAIL ~~LOC~~
30.0000 mL | Freq: Three times a day (TID) | ORAL | Status: DC | PRN
  Administered 2014-07-02 (×2): 30 mL via ORAL
  Filled 2014-07-02 (×4): qty 30

## 2014-07-02 NOTE — Progress Notes (Addendum)
Patient Demographics  Jared Murray, is a 38 y.o. male, DOB - Mar 27, 1976, LGX:[id number]  Admit date - 06/30/2014   Admitting Physician Chesley Mires, MD  Outpatient Primary MD for the patient is Lorayne Marek, MD  LOS - 2   Chief Complaint  Patient presents with  . Hyperglycemia       admission history of present illness/brief narrative: Jared Murray is a 38 y/o man with a history of DM1 as well as psoriasis and EtOH abuse who presents to the ED with two days of nausea, fatigue, anorexia, constipation, and eventual vomiting. He stopped taking his insulin two days ago when he stopped eating, and progressively felt worse as time progressed. He denies fevers, dysuria, productive cough, chest pain (although he did report flank pain upon arrival). He was dyspneic, and so EMS was called. In the ED was found to have severe DKA w/ pH < 7.0, lactic acidosis to 4.5, hyperkalemia. Patient was admitted to intensive care unit under PCCM care, was started on insulin drip, and aggressive IV fluid hydration, anion gap closed, and transitioned to subcutaneous insulin, and transferred to triad hospitalist service on 07/02/14.  Subjective:   Jared Murray today has, No headache, No chest pain,  - No Nausea, No new weakness tingling or numbness, No Cough - SOB.Complains of mild epigastric abdominal pain, reports it feels like reflux.  Assessment & Plan    Active Problems:   Hypokalemia   DKA (diabetic ketoacidoses)   Viral gastroenteritis  Diabetic ketoacidosis/hyperglycemia: -anion gap closed, patient currently back on subcutaneous Lantus and insulin sliding scale, his blood glucose is acceptable, tolerating oral intake without significant nausea or vomiting.  Hypokalemia -Replace, recheck in a.m. -Check magnesium in a.m.  Viral gastroenteritis -patient reports significant  improvement of nausea vomiting, some mild epigastric pain,feels like reflux, already on PPI, started on when necessary GI cocktail.  Leukocytosis -Stress related, secondary to DKA, nausea and vomiting. -Afebrile, negative urinalysis, no acute finding in the chest x-ray. -Recheck in a.m.   Code Status: full  Family Communication: patient is alert and oriented  Disposition Plan: home in a.m. If stable   Procedures  none   Consults   PCCM   Medications  Scheduled Meds: . enoxaparin (LOVENOX) injection  40 mg Subcutaneous Q24H  . insulin aspart  0-15 Units Subcutaneous TID WC  . insulin aspart  0-5 Units Subcutaneous QHS  . insulin glargine  25 Units Subcutaneous Daily  . pantoprazole  40 mg Oral Daily   Continuous Infusions: . 0.9 % NaCl with KCl 40 mEq / L 100 mL/hr (07/02/14 1053)   PRN Meds:.dextrose, gi cocktail, guaiFENesin, sodium chloride  DVT Prophylaxis  Lovenox -  Lab Results  Component Value Date   PLT 329 06/30/2014    Antibiotics   Anti-infectives    None          Objective:   Filed Vitals:   07/01/14 2138 07/02/14 0144 07/02/14 0516 07/02/14 0932  BP: 119/72 129/79 115/78 118/76  Pulse: 89 92 84 82  Temp: 98.4 F (36.9 C) 97.9 F (36.6 C) 97.5 F (36.4 C) 97.8 F (36.6 C)  TempSrc: Oral Oral Oral Oral  Resp: 16 18 18 18   Height:      Weight:  SpO2: 100% 100% 98% 98%    Wt Readings from Last 3 Encounters:  06/30/14 68.04 kg (150 lb)  04/18/14 67.223 kg (148 lb 3.2 oz)  01/25/14 64.139 kg (141 lb 6.4 oz)     Intake/Output Summary (Last 24 hours) at 07/02/14 1414 Last data filed at 07/02/14 0938  Gross per 24 hour  Intake   1400 ml  Output    875 ml  Net    525 ml     Physical Exam  Awake Alert, Oriented X 3, No new F.N deficits, Normal affect [place].AT,PERRAL Supple Neck,No JVD, No cervical lymphadenopathy appriciated.  Symmetrical Chest wall movement, Good air movement bilaterally, CTAB RRR,No Gallops,Rubs or new  Murmurs, No Parasternal Heave +ve B.Sounds, Abd Soft, No tenderness, No organomegaly appriciated, No rebound - guarding or rigidity. No Cyanosis, Clubbing or edema, has skin eczema/psoriasis.   Data Review   Micro Results Recent Results (from the past 240 hour(s))  Culture, blood (routine x 2)     Status: None (Preliminary result)   Collection Time: 06/30/14  8:30 PM  Result Value Ref Range Status   Specimen Description BLOOD ARM LEFT  Final   Special Requests BOTTLES DRAWN AEROBIC ONLY 5CC  Final   Culture  Setup Time   Final    07/01/2014 00:31 Performed at [facility]    Culture   Final           BLOOD CULTURE RECEIVED NO GROWTH TO DATE CULTURE WILL BE HELD FOR 5 DAYS BEFORE ISSUING A FINAL NEGATIVE REPORT Performed at [facility]    Report Status PENDING  Incomplete  Culture, blood (routine x 2)     Status: None (Preliminary result)   Collection Time: 06/30/14  8:42 PM  Result Value Ref Range Status   Specimen Description BLOOD HAND RIGHT  Final   Special Requests BOTTLES DRAWN AEROBIC AND ANAEROBIC 10CC  Final   Culture  Setup Time   Final    07/01/2014 00:30 Performed at [facility]    Culture   Final           BLOOD CULTURE RECEIVED NO GROWTH TO DATE CULTURE WILL BE HELD FOR 5 DAYS BEFORE ISSUING A FINAL NEGATIVE REPORT Performed at [facility]    Report Status PENDING  Incomplete  MRSA PCR Screening     Status: None   Collection Time: 06/30/14  9:32 PM  Result Value Ref Range Status   MRSA by PCR NEGATIVE NEGATIVE Final    Comment:        The GeneXpert MRSA Assay (FDA approved for NASAL specimens only), is one component of a comprehensive MRSA colonization surveillance program. It is not intended to diagnose MRSA infection nor to guide or monitor treatment for MRSA infections.     Radiology Reports Dg Chest Portable 1 View  06/30/2014   CLINICAL DATA:  Initial evaluation for diabetic ketoacidosis, patient  noncompliance with insulin, patient complaining of nausea vomiting diarrhea and dehydration shortness of breath chest pain dry mouth, patient smokes  EXAM: PORTABLE CHEST - 1 VIEW  COMPARISON:  01/23/2014  FINDINGS: The heart size and mediastinal contours are within normal limits. Both lungs are clear. The visualized skeletal structures are unremarkable.  IMPRESSION: No active disease.   Electronically Signed   By: Skipper Cliche M.D.   On: 06/30/2014 18:40   Dg Abd Portable 1v  06/30/2014   CLINICAL DATA:  Initial evaluation for acute abdominal pain and vomiting for 1 day  EXAM: PORTABLE ABDOMEN - 1 VIEW  COMPARISON:  None.  FINDINGS: Moderate gaseous gastric distention. No other abnormally dilated loops of bowel. No abnormal opacities over the abdomen or pelvis. Markedly severe bilateral hip arthritis.  IMPRESSION: Gastric distention  Markedly severe hip arthritis.   Electronically Signed   By: Skipper Cliche M.D.   On: 06/30/2014 20:55    CBC  Recent Labs Lab 06/30/14 1755 06/30/14 2257  WBC 27.7* 21.3*  HGB 14.3 13.1  HCT 44.5 39.4  PLT 391 329  MCV 96.3 91.6  MCH 31.0 30.5  MCHC 32.1 33.2  RDW 14.2 14.0    Chemistries   Recent Labs Lab 06/30/14 1755 06/30/14 2042  06/30/14 2331 07/01/14 0136 07/01/14 0920 07/01/14 1658 07/02/14 0540  NA 134* 141  < > 145 142 140 132* 136*  K 6.7* 5.7*  < > 5.1 4.2 3.7 3.4* 3.1*  CL 84* 100  < > 105 108 107 100 102  CO2 <7* <7*  < > 10* 12* 19 18* 22  GLUCOSE 710* 484*  < > 272* 225* 94 143* 180*  BUN 24* 23  < > 20 17 12 8  5*  CREATININE 1.29 1.11  < > 1.08 0.84 0.61 0.56 0.50  CALCIUM 9.4 7.9*  < > 8.5 8.4 8.3* 8.5 8.4  MG  --  2.4  --   --   --   --   --   --   AST 27  --   --   --   --   --   --   --   ALT 16  --   --   --   --   --   --   --   ALKPHOS 175*  --   --   --   --   --   --   --   BILITOT 0.5  --   --   --   --   --   --   --   < > = values in this interval not  displayed. ------------------------------------------------------------------------------------------------------------------ estimated creatinine clearance is 120.4 mL/min (by C-G formula based on Cr of 0.5). ------------------------------------------------------------------------------------------------------------------ No results for input(s): HGBA1C in the last 72 hours. ------------------------------------------------------------------------------------------------------------------ No results for input(s): CHOL, HDL, LDLCALC, TRIG, CHOLHDL, LDLDIRECT in the last 72 hours. ------------------------------------------------------------------------------------------------------------------ No results for input(s): TSH, T4TOTAL, T3FREE, THYROIDAB in the last 72 hours.  Invalid input(s): FREET3 ------------------------------------------------------------------------------------------------------------------ No results for input(s): VITAMINB12, FOLATE, FERRITIN, TIBC, IRON, RETICCTPCT in the last 72 hours.  Coagulation profile No results for input(s): INR, PROTIME in the last 168 hours.  No results for input(s): DDIMER in the last 72 hours.  Cardiac Enzymes  Recent Labs Lab 06/30/14 1755 06/30/14 2331  TROPONINI <0.30 <0.30   ------------------------------------------------------------------------------------------------------------------ Invalid input(s): POCBNP     Time Spent in minutes   25 minutes   ELGERGAWY, DAWOOD M.D on 07/02/2014 at 2:14 PM  Between 7am to 7pm - Pager - [phone]  After 7pm go to [email or web address] - password [email or web address]  And look for the night coverage person covering for me after hours  [facility] Office  [phone]   **Disclaimer: This note may have been dictated with voice recognition software. Similar sounding words can inadvertently be transcribed and this note may contain transcription errors which may not have been corrected upon  publication of note.**

## 2014-07-03 LAB — BASIC METABOLIC PANEL
Anion gap: 12 (ref 5–15)
BUN: 5 mg/dL — ABNORMAL LOW (ref 6–23)
CO2: 24 meq/L (ref 19–32)
Calcium: 8.4 mg/dL (ref 8.4–10.5)
Chloride: 104 mEq/L (ref 96–112)
Creatinine, Ser: 0.54 mg/dL (ref 0.50–1.35)
GFR calc Af Amer: >90 mL/min (ref >90)
GFR calc non Af Amer: >90 mL/min (ref >90)
GLUCOSE: 189 mg/dL — AB (ref 70–99)
POTASSIUM: 3.8 meq/L (ref 3.7–5.3)
Sodium: 140 mEq/L (ref 137–147)

## 2014-07-03 LAB — CBC
HEMATOCRIT: 31.6 % — AB (ref 39.0–52.0)
HEMOGLOBIN: 11.2 g/dL — AB (ref 13.0–17.0)
MCH: 31.5 pg (ref 26.0–34.0)
MCHC: 35.4 g/dL (ref 30.0–36.0)
MCV: 88.8 fL (ref 78.0–100.0)
Platelets: 255 10*3/uL (ref 150–400)
RBC: 3.56 MIL/uL — AB (ref 4.22–5.81)
RDW: 14.3 % (ref 11.5–15.5)
WBC: 4.2 10*3/uL (ref 4.0–10.5)

## 2014-07-03 LAB — GLUCOSE, CAPILLARY
GLUCOSE-CAPILLARY: 415 mg/dL — AB (ref 70–99)
GLUCOSE-CAPILLARY: 153 mg/dL — AB (ref 70–99)
Glucose-Capillary: 181 mg/dL — ABNORMAL HIGH (ref 70–99)
Glucose-Capillary: 108 mg/dL — ABNORMAL HIGH (ref 70–99)

## 2014-07-03 LAB — MAGNESIUM: MAGNESIUM: 2 mg/dL (ref 1.5–2.5)

## 2014-07-03 MED ORDER — PANTOPRAZOLE SODIUM 40 MG PO TBEC: 40.0000 mg | DELAYED_RELEASE_TABLET | Freq: Every day | ORAL | Status: DC

## 2014-07-03 MED ORDER — INSULIN ASPART 100 UNIT/ML ~~LOC~~ SOLN
18.0000 [IU] | Freq: Once | SUBCUTANEOUS | Status: AC
  Administered 2014-07-03: 18 [IU] via SUBCUTANEOUS

## 2014-07-03 MED ORDER — POTASSIUM CHLORIDE CRYS ER 20 MEQ PO TBCR
40.0000 meq | EXTENDED_RELEASE_TABLET | Freq: Once | ORAL | Status: AC
  Administered 2014-07-03: 40 meq via ORAL
  Filled 2014-07-03: qty 2

## 2014-07-03 NOTE — Discharge Summary (Addendum)
Jared Murray, 38 y.o., DOB [birth year], MRN [id number]. Admission date: 06/30/2014 Discharge Date 07/03/2014 Primary MD Lorayne Marek, MD Admitting Physician Chesley Mires, MD  Admission Diagnosis  DKA (diabetic ketoacidoses) [E13.10] Abdominal pain, acute [R10.0] Chest pain [R07.9]  Discharge Diagnosis   Active Problems:   Hypokalemia   DKA (diabetic ketoacidoses)   Viral gastroenteritis      Past Medical History  Diagnosis Date  . Eczema   . Type 1 diabetes mellitus   . Psoriasis     Past Surgical History  Procedure Laterality Date  . Finger surgery     admission history of present illness/brief narrative: [name] is a 38 y/o man with a history of DM1 as well as psoriasis and EtOH abuse who presents to the ED with two days of nausea, fatigue, anorexia, constipation, and eventual vomiting. He stopped taking his insulin two days ago when he stopped eating, and progressively felt worse as time progressed. He denies fevers, dysuria, productive cough, chest pain (although he did report flank pain upon arrival). He was dyspneic, and so EMS was called. In the ED was found to have severe DKA w/ pH < 7.0, lactic acidosis to 4.5, hyperkalemia. Patient was admitted to intensive care unit under PCCM care, was started on insulin drip, and aggressive IV fluid hydration, anion gap closed, and transitioned to subcutaneous insulin, and transferred to triad hospitalist service on 07/02/14.he tolerated diet very well, and remained stable on his home dose insulin and sliding scale.  Hospital Course See H&P, Labs, Consult and Test reports for all details in brief, patient was admitted for   Active Problems:   Hypokalemia   DKA (diabetic ketoacidoses)   Viral gastroenteritis  Diabetic ketoacidosis/hyperglycemia: -anion gap closed, patient currently back on subcutaneous Lantus and insulin sliding scale, his blood glucose is acceptable, tolerating oral intake without significant nausea or  vomiting.will be discharged home on insulin home regimen.  Hypokalemia - replaced.  Viral gastroenteritis -patient reports significant improvement of nausea vomiting, some mild epigastric pain,feels like reflux,will be discharged on Protonix.  Leukocytosis -Stress related, secondary to DKA, nausea and vomiting. -Afebrile, negative urinalysis, no acute finding in the chest x-ray. -resolved.     Significant Tests:  See full reports for all details    Dg Chest Portable 1 View  06/30/2014   CLINICAL DATA:  Initial evaluation for diabetic ketoacidosis, patient noncompliance with insulin, patient complaining of nausea vomiting diarrhea and dehydration shortness of breath chest pain dry mouth, patient smokes  EXAM: PORTABLE CHEST - 1 VIEW  COMPARISON:  01/23/2014  FINDINGS: The heart size and mediastinal contours are within normal limits. Both lungs are clear. The visualized skeletal structures are unremarkable.  IMPRESSION: No active disease.   Electronically Signed   By: Skipper Cliche M.D.   On: 06/30/2014 18:40   Dg Abd Portable 1v  06/30/2014   CLINICAL DATA:  Initial evaluation for acute abdominal pain and vomiting for 1 day  EXAM: PORTABLE ABDOMEN - 1 VIEW  COMPARISON:  None.  FINDINGS: Moderate gaseous gastric distention. No other abnormally dilated loops of bowel. No abnormal opacities over the abdomen or pelvis. Markedly severe bilateral hip arthritis.  IMPRESSION: Gastric distention  Markedly severe hip arthritis.   Electronically Signed   By: Skipper Cliche M.D.   On: 06/30/2014 20:55     Today   Subjective:   Jared Murray today has no headache,no chest abdominal pain,no new weakness tingling or numbness, feels much better .  Objective:   Blood  pressure 100/75, pulse 88, temperature 97.8 F (36.6 C), temperature source Oral, resp. rate 18, height 5\' 10"  (1.778 m), weight 68.04 kg (150 lb), SpO2 94 %.  Intake/Output Summary (Last 24 hours) at 07/03/14 1247 Last data  filed at 07/03/14 0930  Gross per 24 hour  Intake 2870.42 ml  Output    350 ml  Net 2520.42 ml    Exam Awake Alert, Oriented *3, No new F.N deficits, Normal affect [place].AT,PERRAL Supple Neck,No JVD, No cervical lymphadenopathy appriciated.  Symmetrical Chest wall movement, Good air movement bilaterally, CTAB RRR,No Gallops,Rubs or new Murmurs, No Parasternal Heave +ve B.Sounds, Abd Soft, Non tender, No organomegaly appriciated, No rebound -guarding or rigidity. No Cyanosis, Clubbing or edema,   Data Review   Cultures -  CBC w Diff:  Lab Results  Component Value Date   WBC 4.2 07/03/2014   HGB 11.2* 07/03/2014   HCT 31.6* 07/03/2014   PLT 255 07/03/2014   LYMPHOPCT 5* 01/23/2014   MONOPCT 6 01/23/2014   EOSPCT 0 01/23/2014   BASOPCT 0 01/23/2014   CMP:  Lab Results  Component Value Date   NA 140 07/03/2014   K 3.8 07/03/2014   CL 104 07/03/2014   CO2 24 07/03/2014   BUN 5* 07/03/2014   CREATININE 0.54 07/03/2014   PROT 8.9* 06/30/2014   ALBUMIN 4.5 06/30/2014   BILITOT 0.5 06/30/2014   ALKPHOS 175* 06/30/2014   AST 27 06/30/2014   ALT 16 06/30/2014  .  Micro Results Recent Results (from the past 240 hour(s))  Culture, blood (routine x 2)     Status: None (Preliminary result)   Collection Time: 06/30/14  8:30 PM  Result Value Ref Range Status   Specimen Description BLOOD ARM LEFT  Final   Special Requests BOTTLES DRAWN AEROBIC ONLY 5CC  Final   Culture  Setup Time   Final    07/01/2014 00:31 Performed at [facility]    Culture   Final           BLOOD CULTURE RECEIVED NO GROWTH TO DATE CULTURE WILL BE HELD FOR 5 DAYS BEFORE ISSUING A FINAL NEGATIVE REPORT Performed at [facility]    Report Status PENDING  Incomplete  Culture, blood (routine x 2)     Status: None (Preliminary result)   Collection Time: 06/30/14  8:42 PM  Result Value Ref Range Status   Specimen Description BLOOD HAND RIGHT  Final   Special Requests BOTTLES DRAWN AEROBIC  AND ANAEROBIC 10CC  Final   Culture  Setup Time   Final    07/01/2014 00:30 Performed at [facility]    Culture   Final           BLOOD CULTURE RECEIVED NO GROWTH TO DATE CULTURE WILL BE HELD FOR 5 DAYS BEFORE ISSUING A FINAL NEGATIVE REPORT Performed at [facility]    Report Status PENDING  Incomplete  MRSA PCR Screening     Status: None   Collection Time: 06/30/14  9:32 PM  Result Value Ref Range Status   MRSA by PCR NEGATIVE NEGATIVE Final    Comment:        The GeneXpert MRSA Assay (FDA approved for NASAL specimens only), is one component of a comprehensive MRSA colonization surveillance program. It is not intended to diagnose MRSA infection nor to guide or monitor treatment for MRSA infections.      Discharge Instructions          Follow-up Information  Follow up with Lorayne Marek, MD. Schedule an appointment as soon as possible for a visit in 1 week.   Specialty:  Internal Medicine   Contact information:   [place] [address] [phone]       Discharge Medications     Medication List    TAKE these medications        ibuprofen 600 MG tablet  Commonly known as:  ADVIL,MOTRIN  Take 1 tablet (600 mg total) by mouth every 8 (eight) hours as needed.     insulin glargine 100 UNIT/ML injection  Commonly known as:  LANTUS  Inject 25 Units into the skin daily.     Insulin Syringe-Needle U-100 29G X 1/2" 2 ML Misc  Commonly known as:  B-D INS SYRINGE 2CC/29GX1/2"  Check Blood sugar TID and QHS     NOVOLOG 100 UNIT/ML injection  Generic drug:  insulin aspart  Sliding scale CBG 70 - 120: 0 units CBG 121 - 150: 1 unit,  CBG 151 - 200: 2 units,  CBG 201 - 250: 3 units,  CBG 251 - 300: 5 units,  CBG 301 - 350: 7 units,  CBG 351 - 400: 9 units   CBG > 400: 10 units     pantoprazole 40 MG tablet  Commonly known as:  PROTONIX  Take 1 tablet (40 mg total) by mouth daily.         Total Time in preparing paper  work, data evaluation and todays exam - 35 minutes  Norleen Xie M.D on 07/03/2014 at 12:47 PM  [place]  [phone]

## 2014-07-03 NOTE — Discharge Instructions (Signed)
Follow with Primary MD Lorayne Marek, MD in 7 days   Get CBC, CMP, 2 view Chest X ray checked  by Primary MD next visit.    Activity: As tolerated with Full fall precautions use walker/cane & assistance as needed   Disposition Home    Diet: carbohydrate modified , with feeding assistance and aspiration precautions as needed.  For Heart failure patients - Check your Weight same time everyday, if you gain over 2 pounds, or you develop in leg swelling, experience more shortness of breath or chest pain, call your Primary MD immediately. Follow Cardiac Low Salt Diet and 1.8 lit/day fluid restriction.   On your next visit with your primary care physician please Get Medicines reviewed and adjusted.   Please request your Prim.MD to go over all Hospital Tests and Procedure/Radiological results at the follow up, please get all Hospital records sent to your Prim MD by signing hospital release before you go home.   If you experience worsening of your admission symptoms, develop shortness of breath, life threatening emergency, suicidal or homicidal thoughts you must seek medical attention immediately by calling 911 or calling your MD immediately  if symptoms less severe.  You Must read complete instructions/literature along with all the possible adverse reactions/side effects for all the Medicines you take and that have been prescribed to you. Take any new Medicines after you have completely understood and accpet all the possible adverse reactions/side effects.   Do not drive, operating heavy machinery, perform activities at heights, swimming or participation in water activities or provide baby sitting services if your were admitted for syncope or siezures until you have seen by Primary MD or a Neurologist and advised to do so again.  Do not drive when taking Pain medications.    Do not take more than prescribed Pain, Sleep and Anxiety Medications  Special Instructions: If you have smoked or  chewed Tobacco  in the last 2 yrs please stop smoking, stop any regular Alcohol  and or any Recreational drug use.  Wear Seat belts while driving.   Please note  You were cared for by a hospitalist during your hospital stay. If you have any questions about your discharge medications or the care you received while you were in the hospital after you are discharged, you can call the unit and asked to speak with the hospitalist on call if the hospitalist that took care of you is not available. Once you are discharged, your primary care physician will handle any further medical issues. Please note that NO REFILLS for any discharge medications will be authorized once you are discharged, as it is imperative that you return to your primary care physician (or establish a relationship with a primary care physician if you do not have one) for your aftercare needs so that they can reassess your need for medications and monitor your lab values.

## 2014-07-03 NOTE — Progress Notes (Signed)
Md made aware of cbg 415, new order given and noted.

## 2014-07-03 NOTE — Progress Notes (Signed)
Discharge home. Home discharge istruction given, no questions verbalized. Diabetes sick day management explained and given to patient.

## 2014-07-03 NOTE — Progress Notes (Signed)
Inpatient Diabetes Program Recommendations  AACE/ADA: New Consensus Statement on Inpatient Glycemic Control (2013)  Target Ranges:  Prepandial:   less than 140 mg/dL      Peak postprandial:   less than 180 mg/dL (1-2 hours)      Critically ill patients:  140 - 180 mg/dL   Reason for Visit: MD consult  Diabetes history: Second admission this year for DKA Outpatient Diabetes medications: Lantus 25 units, Novolog correction scale Current orders for Inpatient glycemic control: Lantus 25 units daily, Novolog moderate correction tid, HS correction   Note:  Diagnosed last year with diabetes.  Second admission this year with DKA.  Both admissions patient had stopped taking insulin because he was not able to eat normally.  Patient states he has not gone to [facility], but had a home visit (presumably from [facility]) regarding "what to eat".  Patient still experiencing a lot of stress because he has bad hips and is currently unable to work.    Reminded patient that he still needs to take some insulin-- even when unable to eat normally.  States his wife and older children are able to assist him with insulin and CBG testing, but he was by himself.  When his wife came home from work, she checked his CBG and he came to the hospital.  Suggested he follow up ASAP with MD at [facility].  Requested he ask MD for specific insulin guidelines to sick days when he cannot eat normally.  Also suggested he contact [facility] by phone when he first becomes ill for guidance.  Suggested he ask MD regarding acceptable over the counter treatment for common cold, etc. Requested his nurse give him handout regarding Sick Day rules.  Patient was receptive to my visit and seemed appreciative. Thank you.  Annamay Laymon S. Marcelline Mates, RN, CNS, CDE Inpatient Diabetes Program, team pager [phone]

## 2014-07-07 LAB — CULTURE, BLOOD (ROUTINE X 2)
CULTURE: NO GROWTH
Culture: NO GROWTH

## 2014-07-18 ENCOUNTER — Inpatient Hospital Stay: Payer: Medicaid Other | Admitting: Internal Medicine

## 2014-08-04 ENCOUNTER — Encounter: Payer: Self-pay | Admitting: Internal Medicine

## 2014-08-04 ENCOUNTER — Ambulatory Visit: Payer: Medicaid Other | Attending: Internal Medicine | Admitting: Internal Medicine

## 2014-08-04 VITALS — BP 110/78 | HR 105 | Temp 98.4°F | Resp 16 | Ht 70.0 in | Wt 143.8 lb

## 2014-08-04 DIAGNOSIS — E089 Diabetes mellitus due to underlying condition without complications: Secondary | ICD-10-CM

## 2014-08-04 DIAGNOSIS — L309 Dermatitis, unspecified: Secondary | ICD-10-CM

## 2014-08-04 LAB — GLUCOSE, POCT (MANUAL RESULT ENTRY): POC Glucose: 117 mg/dl — AB (ref 70–99)

## 2014-08-04 LAB — POCT GLYCOSYLATED HEMOGLOBIN (HGB A1C): Hemoglobin A1C: 8.6

## 2014-08-04 MED ORDER — CLOBETASOL PROPIONATE 0.05 % EX CREA: 1.0000 "application " | TOPICAL_CREAM | Freq: Two times a day (BID) | CUTANEOUS | Status: DC

## 2014-08-04 MED ORDER — TRIAMCINOLONE ACETONIDE 0.1 % EX CREA: 1.0000 "application " | TOPICAL_CREAM | Freq: Two times a day (BID) | CUTANEOUS | Status: DC

## 2014-08-04 NOTE — Patient Instructions (Signed)
Diabetes Mellitus and Food It is important for you to manage your blood sugar (glucose) level. Your blood glucose level can be greatly affected by what you eat. Eating healthier foods in the appropriate amounts throughout the day at about the same time each day will help you control your blood glucose level. It can also help slow or prevent worsening of your diabetes mellitus. Healthy eating may even help you improve the level of your blood pressure and reach or maintain a healthy weight.  HOW CAN FOOD AFFECT ME? Carbohydrates Carbohydrates affect your blood glucose level more than any other type of food. Your dietitian will help you determine how many carbohydrates to eat at each meal and teach you how to count carbohydrates. Counting carbohydrates is important to keep your blood glucose at a healthy level, especially if you are using insulin or taking certain medicines for diabetes mellitus. Alcohol Alcohol can cause sudden decreases in blood glucose (hypoglycemia), especially if you use insulin or take certain medicines for diabetes mellitus. Hypoglycemia can be a life-threatening condition. Symptoms of hypoglycemia (sleepiness, dizziness, and disorientation) are similar to symptoms of having too much alcohol.  If your health care provider has given you approval to drink alcohol, do so in moderation and use the following guidelines:  Women should not have more than one drink per day, and men should not have more than two drinks per day. One drink is equal to:  12 oz of beer.  5 oz of wine.  1 oz of hard liquor.  Do not drink on an empty stomach.  Keep yourself hydrated. Have water, diet soda, or unsweetened iced tea.  Regular soda, juice, and other mixers might contain a lot of carbohydrates and should be counted. WHAT FOODS ARE NOT RECOMMENDED? As you make food choices, it is important to remember that all foods are not the same. Some foods have fewer nutrients per serving than other  foods, even though they might have the same number of calories or carbohydrates. It is difficult to get your body what it needs when you eat foods with fewer nutrients. Examples of foods that you should avoid that are high in calories and carbohydrates but low in nutrients include:  Trans fats (most processed foods list trans fats on the Nutrition Facts label).  Regular soda.  Juice.  Candy.  Sweets, such as cake, pie, doughnuts, and cookies.  Fried foods. WHAT FOODS CAN I EAT? Have nutrient-rich foods, which will nourish your body and keep you healthy. The food you should eat also will depend on several factors, including:  The calories you need.  The medicines you take.  Your weight.  Your blood glucose level.  Your blood pressure level.  Your cholesterol level. You also should eat a variety of foods, including:  Protein, such as meat, poultry, fish, tofu, nuts, and seeds (lean animal proteins are best).  Fruits.  Vegetables.  Dairy products, such as milk, cheese, and yogurt (low fat is best).  Breads, grains, pasta, cereal, rice, and beans.  Fats such as olive oil, trans fat-free margarine, canola oil, avocado, and olives. DOES EVERYONE WITH DIABETES MELLITUS HAVE THE SAME MEAL PLAN? Because every person with diabetes mellitus is different, there is not one meal plan that works for everyone. It is very important that you meet with a dietitian who will help you create a meal plan that is just right for you. Document Released: 05/01/2005 Document Revised: 08/09/2013 Document Reviewed: 07/01/2013 Exit[employer] Patient Information 2015 [employer]. This   information is not intended to replace advice given to you by your health care provider. Make sure you discuss any questions you have with your health care provider.  

## 2014-08-04 NOTE — Progress Notes (Signed)
MRN: [id number] Name: Jared Murray  Sex: male Age: 38 y.o. DOB: [birth year]  Allergies: Penicillins  Chief Complaint  Patient presents with  . Diabetes    HPI: Patient is 38 y.o. male who  Has history of diabetes,eczema comes today for followup, patient denies any hypoglycemic symptoms, his hemoglobin A1c is improving currently taking Lantus 30 units each bedtime and NovoLog sliding scale, patient denies any headache dizziness chest and shortness of breath, still smokes cigarettes 30 for a day, have advised patient to quit smoking.  Past Medical History  Diagnosis Date  . Eczema   . Type 1 diabetes mellitus   . Psoriasis     Past Surgical History  Procedure Laterality Date  . Finger surgery        Medication List       This list is accurate as of: 08/04/14 12:25 PM.  Always use your most recent med list.               clobetasol cream 0.05 %  Commonly known as:  TEMOVATE  Apply 1 application topically 2 (two) times daily.     ibuprofen 600 MG tablet  Commonly known as:  ADVIL,MOTRIN  Take 1 tablet (600 mg total) by mouth every 8 (eight) hours as needed.     insulin glargine 100 UNIT/ML injection  Commonly known as:  LANTUS  Inject 25 Units into the skin daily.     Insulin Syringe-Needle U-100 29G X 1/2" 2 ML Misc  Commonly known as:  B-D INS SYRINGE 2CC/29GX1/2"  Check Blood sugar TID and QHS     NOVOLOG 100 UNIT/ML injection  Generic drug:  insulin aspart  Sliding scale CBG 70 - 120: 0 units CBG 121 - 150: 1 unit,  CBG 151 - 200: 2 units,  CBG 201 - 250: 3 units,  CBG 251 - 300: 5 units,  CBG 301 - 350: 7 units,  CBG 351 - 400: 9 units   CBG > 400: 10 units     pantoprazole 40 MG tablet  Commonly known as:  PROTONIX  Take 1 tablet (40 mg total) by mouth daily.     triamcinolone cream 0.1 %  Commonly known as:  KENALOG  Apply 1 application topically 2 (two) times daily.        Meds ordered this encounter  Medications  . clobetasol cream  (TEMOVATE) 0.05 %    Sig: Apply 1 application topically 2 (two) times daily.    Dispense:  30 g    Refill:  2  . triamcinolone cream (KENALOG) 0.1 %    Sig: Apply 1 application topically 2 (two) times daily.    Dispense:  30 g    Refill:  2    Immunization History  Administered Date(s) Administered  . Pneumococcal Polysaccharide-23 03/18/2013, 01/26/2014    Family History  Problem Relation Age of Onset  . Hypertension Father   . Diabetes      multiple  . Lupus Cousin   . Stroke Maternal Grandmother   . Stroke Paternal Grandmother     History  Substance Use Topics  . Smoking status: Current Every Day Smoker  . Smokeless tobacco: Not on file  . Alcohol Use: Yes     Comment: recently quit - 7/14     Review of Systems   As noted in HPI  Filed Vitals:   08/04/14 1205  BP: 110/78  Pulse: 105  Temp: 98.4 F (36.9 C)  Resp: 16  Physical Exam  Physical Exam  Constitutional: No distress.  Eyes: EOM are normal. Pupils are equal, round, and reactive to light.  Cardiovascular: Normal rate and regular rhythm.   Pulmonary/Chest: Breath sounds normal. No respiratory distress. He has no wheezes. He has no rales.  Musculoskeletal: He exhibits no edema.  Skin: Skin is dry.    CBC    Component Value Date/Time   WBC 4.2 07/03/2014 0515   RBC 3.56* 07/03/2014 0515   HGB 11.2* 07/03/2014 0515   HCT 31.6* 07/03/2014 0515   PLT 255 07/03/2014 0515   MCV 88.8 07/03/2014 0515   LYMPHSABS 0.7 01/23/2014 0045   MONOABS 0.9 01/23/2014 0045   EOSABS 0.0 01/23/2014 0045   BASOSABS 0.0 01/23/2014 0045    CMP     Component Value Date/Time   NA 140 07/03/2014 0515   K 3.8 07/03/2014 0515   CL 104 07/03/2014 0515   CO2 24 07/03/2014 0515   GLUCOSE 189* 07/03/2014 0515   BUN 5* 07/03/2014 0515   CREATININE 0.54 07/03/2014 0515   CALCIUM 8.4 07/03/2014 0515   PROT 8.9* 06/30/2014 1755   ALBUMIN 4.5 06/30/2014 1755   AST 27 06/30/2014 1755   ALT 16 06/30/2014 1755    ALKPHOS 175* 06/30/2014 1755   BILITOT 0.5 06/30/2014 1755   GFRNONAA >90 07/03/2014 0515   GFRAA >90 07/03/2014 0515    No results found for: CHOL  No components found for: HGA1C  Lab Results  Component Value Date/Time   AST 27 06/30/2014 05:55 PM    Assessment and Plan  Diabetes mellitus due to underlying condition without complications - Plan:  Results for orders placed or performed in visit on 08/04/14  Glucose (CBG)  Result Value Ref Range   POC Glucose 117 (A) 70 - 99 mg/dl  HgB A1c  Result Value Ref Range   Hemoglobin A1C 8.6    Hemoglobin A1c is improving, still diabetes is not well controlled, I have advised patient to increase the dose of Lantus to 33 units and monitor blood sugar level if his fasting blood sugars postural more than 1 40 mg/dL, he will increase the dose to 35 units, continue with NovoLog sliding scale, advised for diabetes meal planning, will repeat A1c in 3 months. Fasting lipid panel on the next visit   Eczema - Plan: patient is given refill on his medications. clobetasol cream (TEMOVATE) 0.05 %, triamcinolone cream (KENALOG) 0.1 %   Return in about 3 months (around 11/03/2014) for diabetes.  Lorayne Marek, MD

## 2014-08-04 NOTE — Progress Notes (Signed)
Patient her today for f/u DM . Patient states he checks his blood sugar 3 times a day. Blood sugar runs between 70-200. Patient states he has had Flu vaccine. Patient was in hospital for DKA 2-3 weeks ago. Patient would also like a refill on eczema medications

## 2014-08-26 IMAGING — CR DG CHEST 2V
2 series · 2 of 2 positions shown · non-contrast
Comparison: None.

CLINICAL DATA: Chest pain, shortness of breath

CHEST - 2 VIEW

[w chest pa]
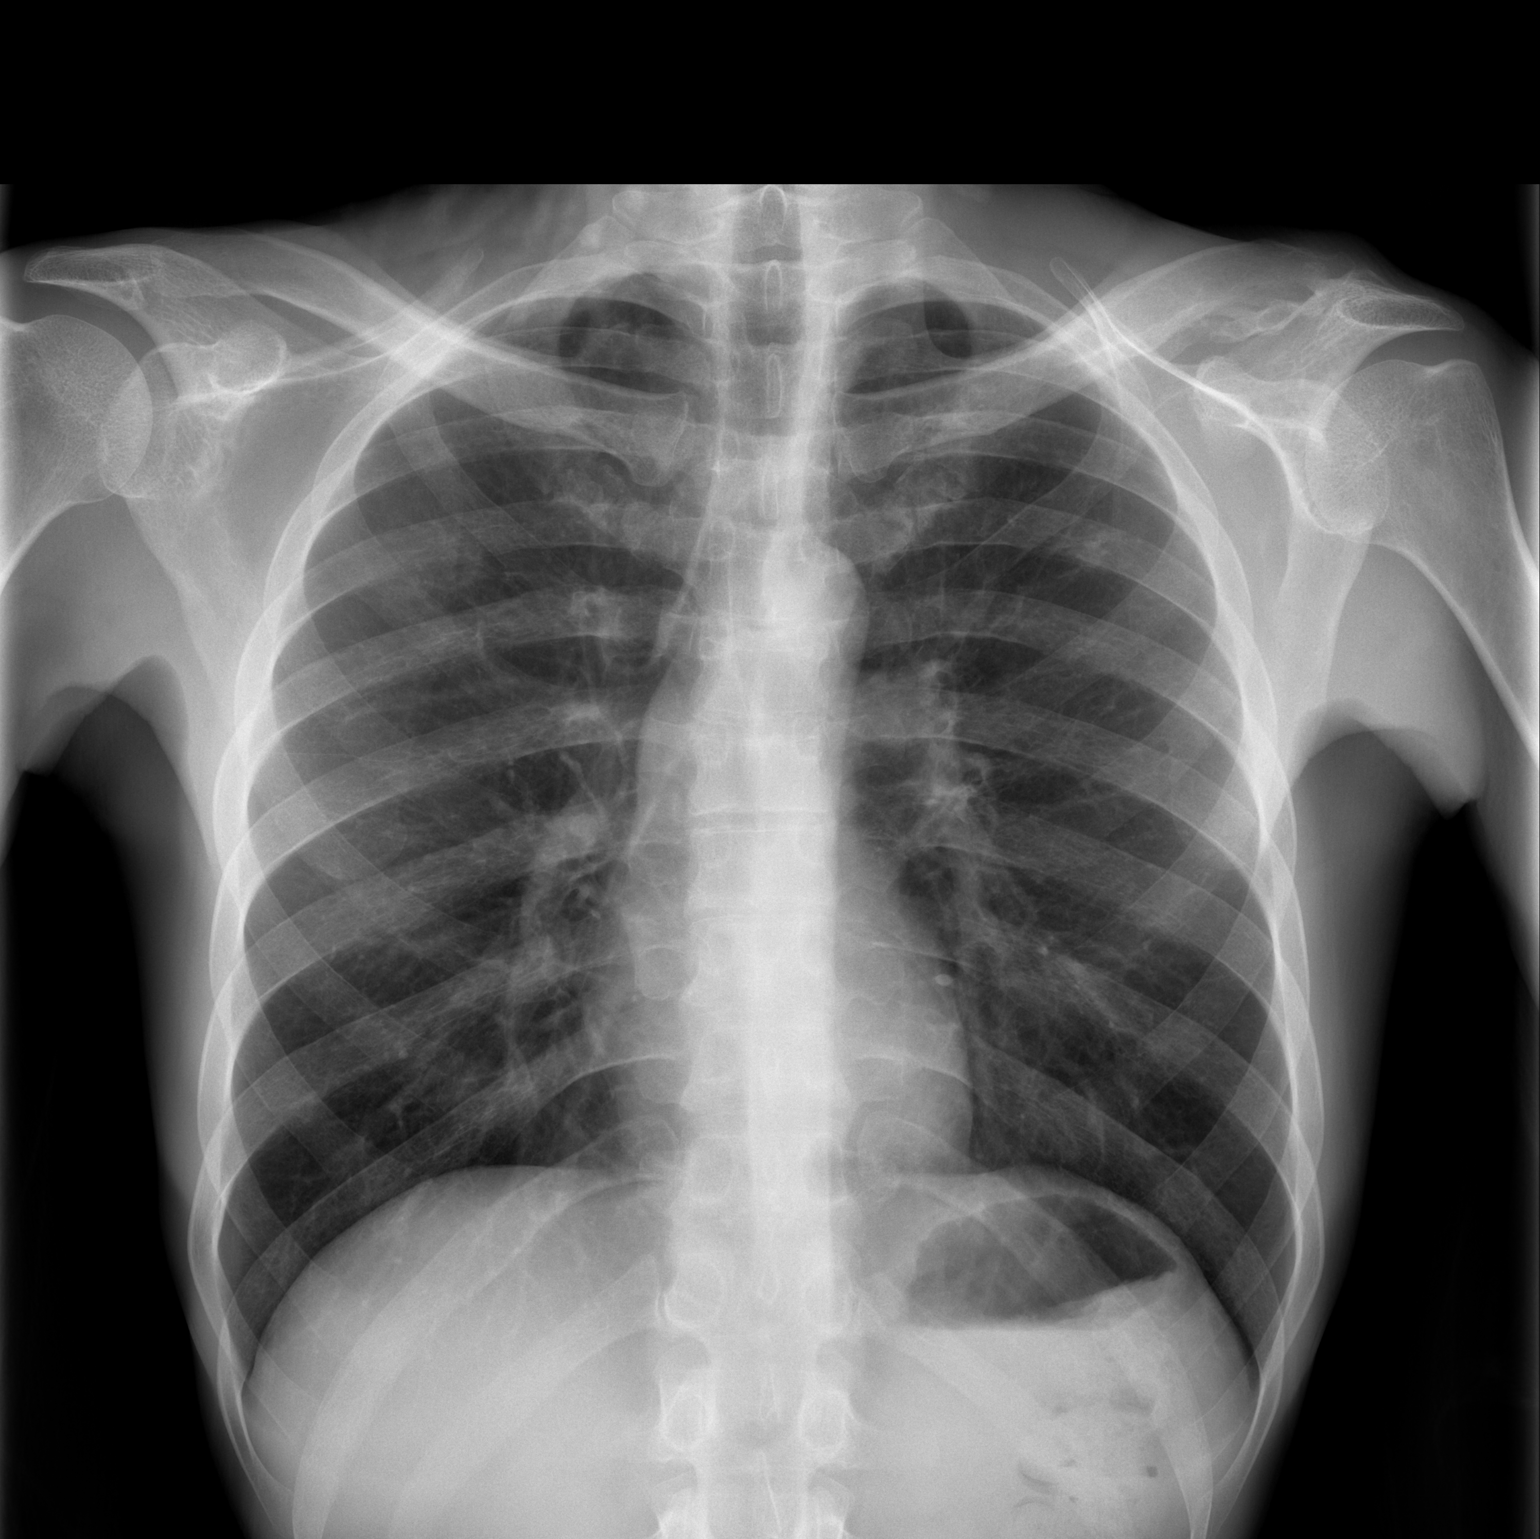

[w chest lat]
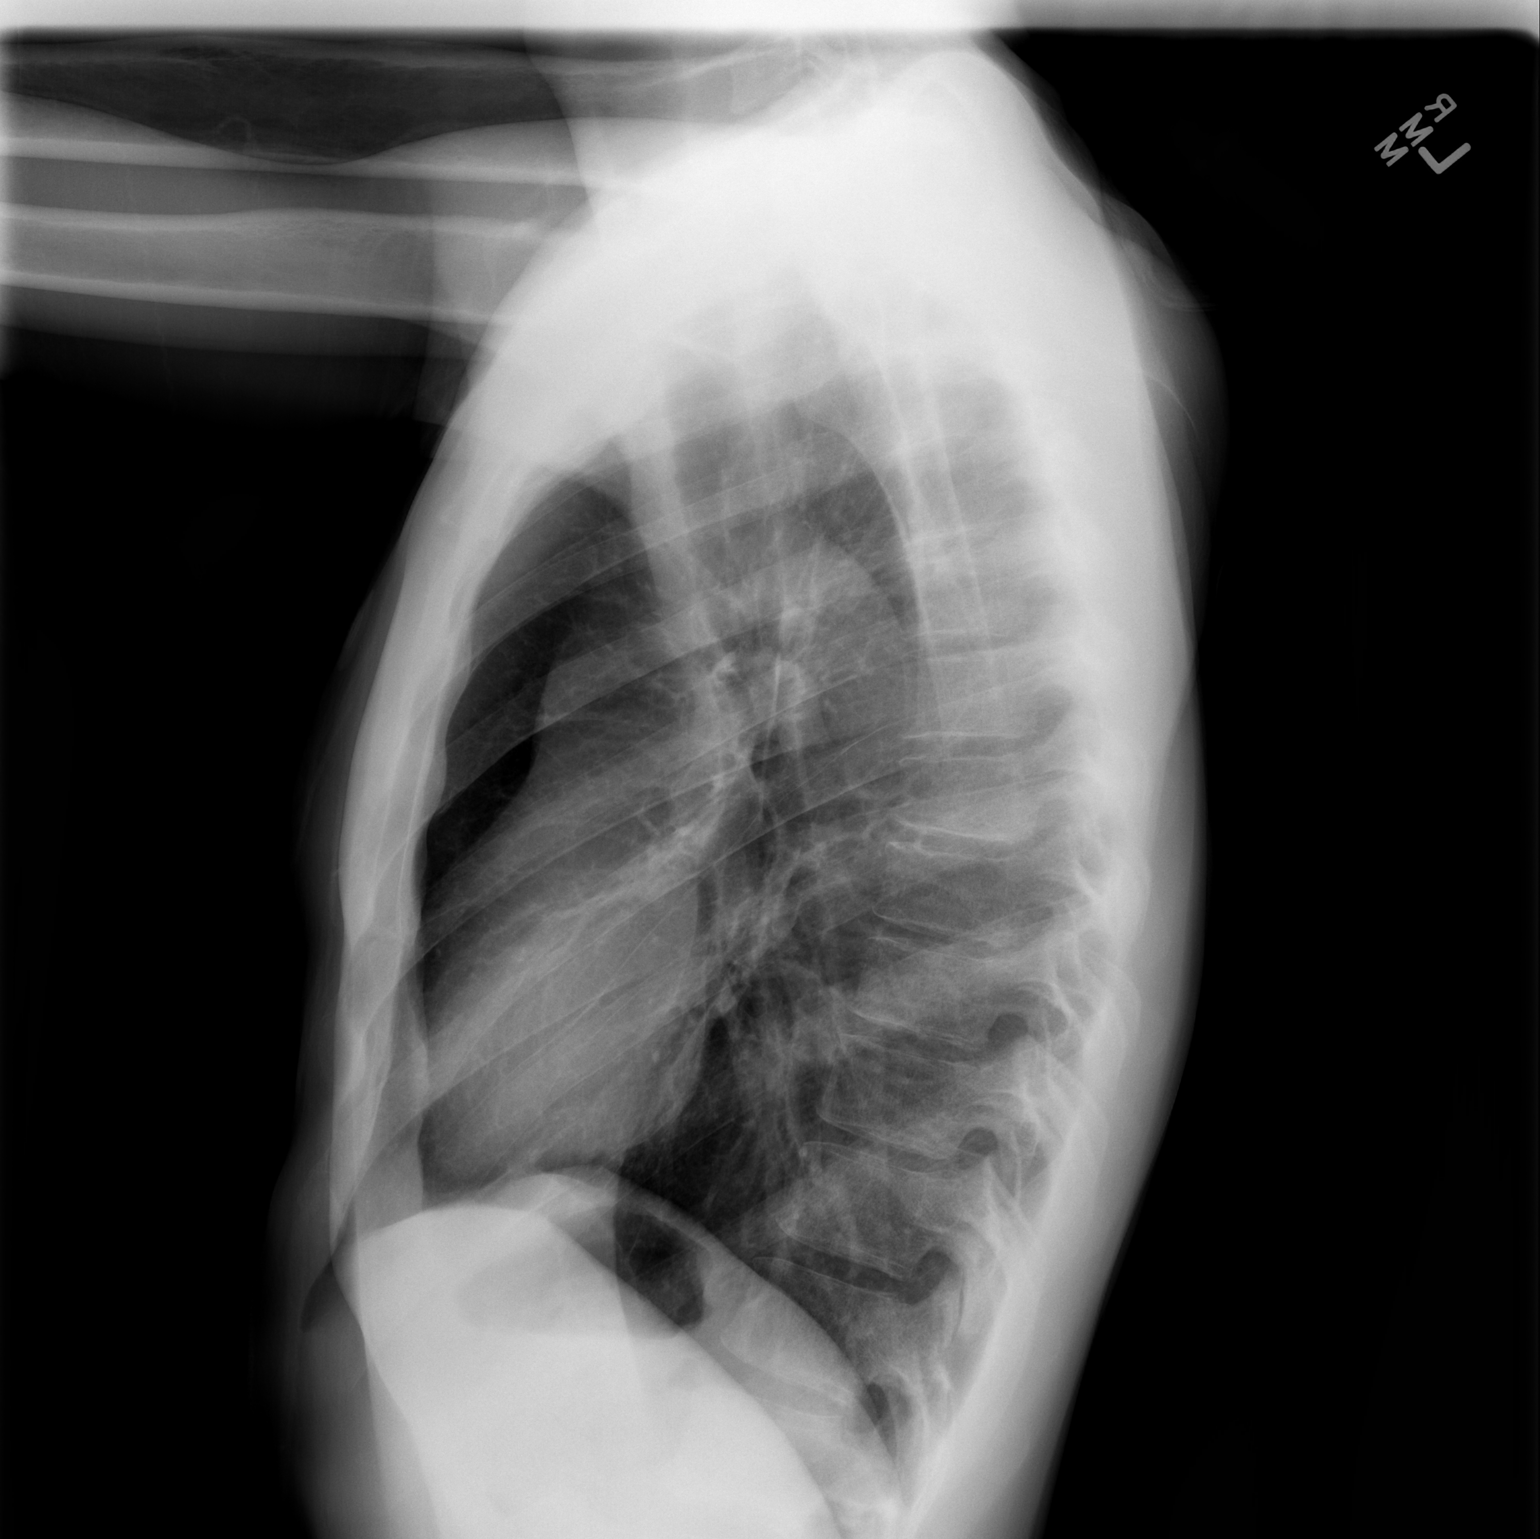

[2 of 2 positions shown; findings below may reference images not displayed]

FINDINGS: Cardiomediastinal silhouette is unremarkable.  No acute
infiltrate or pleural effusion.  No pulmonary edema.  Minimal
degenerative changes mid thoracic spine.
IMPRESSION: No active disease.

## 2014-09-19 ENCOUNTER — Other Ambulatory Visit: Payer: Self-pay

## 2014-09-19 MED ORDER — NOVOLOG 100 UNIT/ML ~~LOC~~ SOLN: SUBCUTANEOUS | Status: DC

## 2014-09-19 MED ORDER — INSULIN GLARGINE 100 UNIT/ML ~~LOC~~ SOLN: 33.0000 [IU] | Freq: Every day | SUBCUTANEOUS | Status: DC

## 2014-09-20 ENCOUNTER — Telehealth: Payer: Self-pay | Admitting: Internal Medicine

## 2014-09-20 NOTE — Telephone Encounter (Signed)
Pharmacy calling to clarify novolog script. Please f/u pharmacy.

## 2014-09-21 ENCOUNTER — Telehealth: Payer: Self-pay

## 2014-09-21 MED ORDER — NOVOLOG 100 UNIT/ML ~~LOC~~ SOLN: SUBCUTANEOUS | Status: DC

## 2014-09-21 NOTE — Telephone Encounter (Signed)
Spoke with [facility] They never received electronic prescription for novolog Prescription faxed to [phone]

## 2014-10-10 ENCOUNTER — Inpatient Hospital Stay (HOSPITAL_COMMUNITY)
Admission: EM | Admit: 2014-10-10 | Discharge: 2014-10-12 | DRG: 637 | Disposition: A | Discharge status: Home / Self Care | Payer: Medicaid Other | Attending: Internal Medicine | Admitting: Internal Medicine

## 2014-10-10 ENCOUNTER — Encounter (HOSPITAL_COMMUNITY): Payer: Self-pay | Admitting: *Deleted

## 2014-10-10 ENCOUNTER — Inpatient Hospital Stay (HOSPITAL_COMMUNITY): Payer: Medicaid Other

## 2014-10-10 ENCOUNTER — Other Ambulatory Visit (HOSPITAL_COMMUNITY): Payer: Self-pay

## 2014-10-10 DIAGNOSIS — E0810 Diabetes mellitus due to underlying condition with ketoacidosis without coma: Secondary | ICD-10-CM

## 2014-10-10 DIAGNOSIS — K219 Gastro-esophageal reflux disease without esophagitis: Secondary | ICD-10-CM | POA: Diagnosis present

## 2014-10-10 DIAGNOSIS — A084 Viral intestinal infection, unspecified: Secondary | ICD-10-CM

## 2014-10-10 DIAGNOSIS — Z880 Allergy status to penicillin: Secondary | ICD-10-CM | POA: Diagnosis not present

## 2014-10-10 DIAGNOSIS — E860 Dehydration: Secondary | ICD-10-CM

## 2014-10-10 DIAGNOSIS — J9600 Acute respiratory failure, unspecified whether with hypoxia or hypercapnia: Secondary | ICD-10-CM | POA: Diagnosis present

## 2014-10-10 DIAGNOSIS — L309 Dermatitis, unspecified: Secondary | ICD-10-CM

## 2014-10-10 DIAGNOSIS — L409 Psoriasis, unspecified: Secondary | ICD-10-CM

## 2014-10-10 DIAGNOSIS — E43 Unspecified severe protein-calorie malnutrition: Secondary | ICD-10-CM

## 2014-10-10 DIAGNOSIS — N179 Acute kidney failure, unspecified: Secondary | ICD-10-CM | POA: Diagnosis not present

## 2014-10-10 DIAGNOSIS — Z794 Long term (current) use of insulin: Secondary | ICD-10-CM

## 2014-10-10 DIAGNOSIS — F17210 Nicotine dependence, cigarettes, uncomplicated: Secondary | ICD-10-CM | POA: Diagnosis present

## 2014-10-10 DIAGNOSIS — E875 Hyperkalemia: Secondary | ICD-10-CM | POA: Diagnosis present

## 2014-10-10 DIAGNOSIS — M25552 Pain in left hip: Secondary | ICD-10-CM

## 2014-10-10 DIAGNOSIS — R0603 Acute respiratory distress: Secondary | ICD-10-CM

## 2014-10-10 DIAGNOSIS — F1290 Cannabis use, unspecified, uncomplicated: Secondary | ICD-10-CM | POA: Diagnosis present

## 2014-10-10 DIAGNOSIS — E1010 Type 1 diabetes mellitus with ketoacidosis without coma: Principal | ICD-10-CM

## 2014-10-10 DIAGNOSIS — E089 Diabetes mellitus due to underlying condition without complications: Secondary | ICD-10-CM

## 2014-10-10 DIAGNOSIS — R000 Tachycardia, unspecified: Secondary | ICD-10-CM | POA: Diagnosis present

## 2014-10-10 DIAGNOSIS — R0600 Dyspnea, unspecified: Secondary | ICD-10-CM | POA: Diagnosis present

## 2014-10-10 DIAGNOSIS — R634 Abnormal weight loss: Secondary | ICD-10-CM

## 2014-10-10 DIAGNOSIS — E876 Hypokalemia: Secondary | ICD-10-CM

## 2014-10-10 LAB — COMPREHENSIVE METABOLIC PANEL
ALK PHOS: 119 U/L — AB (ref 39–117)
ALT: 49 U/L (ref 0–53)
ANION GAP: 33 — AB (ref 5–15)
AST: 48 U/L — ABNORMAL HIGH (ref 0–37)
Albumin: 5.2 g/dL (ref 3.5–5.2)
BILIRUBIN TOTAL: 1.7 mg/dL — AB (ref 0.3–1.2)
BUN: 22 mg/dL (ref 6–23)
CO2: 6 mmol/L — CL (ref 19–32)
Calcium: 9 mg/dL (ref 8.4–10.5)
Chloride: 96 mmol/L (ref 96–112)
Creatinine, Ser: 2.02 mg/dL — ABNORMAL HIGH (ref 0.50–1.35)
GFR calc non Af Amer: 40 mL/min — ABNORMAL LOW (ref >90)
GFR, EST AFRICAN AMERICAN: 47 mL/min — AB (ref >90)
GLUCOSE: 566 mg/dL — AB (ref 70–99)
POTASSIUM: 6.1 mmol/L — AB (ref 3.5–5.1)
Sodium: 135 mmol/L (ref 135–145)
TOTAL PROTEIN: 8.9 g/dL — AB (ref 6.0–8.3)

## 2014-10-10 LAB — RAPID URINE DRUG SCREEN, HOSP PERFORMED
Amphetamines: NOT DETECTED
BENZODIAZEPINES: NOT DETECTED
Barbiturates: NOT DETECTED
COCAINE: NOT DETECTED
OPIATES: NOT DETECTED
Tetrahydrocannabinol: NOT DETECTED

## 2014-10-10 LAB — I-STAT VENOUS BLOOD GAS, ED
Acid-base deficit: 21 mmol/L — ABNORMAL HIGH (ref 0.0–2.0)
BICARBONATE: 7.1 meq/L — AB (ref 20.0–24.0)
O2 Saturation: 78 %
PH VEN: 7.095 — AB (ref 7.250–7.300)
TCO2: 8 mmol/L (ref 0–100)
pCO2, Ven: 23.3 mmHg — ABNORMAL LOW (ref 45.0–50.0)
pO2, Ven: 57 mmHg — ABNORMAL HIGH (ref 30.0–45.0)

## 2014-10-10 LAB — BASIC METABOLIC PANEL
ANION GAP: 23 — AB (ref 5–15)
ANION GAP: 17 — AB (ref 5–15)
ANION GAP: 13 (ref 5–15)
BUN: 17 mg/dL (ref 6–23)
BUN: 16 mg/dL (ref 6–23)
BUN: 13 mg/dL (ref 6–23)
CALCIUM: 8.6 mg/dL (ref 8.4–10.5)
CHLORIDE: 106 mmol/L (ref 96–112)
CHLORIDE: 107 mmol/L (ref 96–112)
CO2: 6 mmol/L — CL (ref 19–32)
CO2: 11 mmol/L — ABNORMAL LOW (ref 19–32)
CO2: 18 mmol/L — ABNORMAL LOW (ref 19–32)
CREATININE: 1.78 mg/dL — AB (ref 0.50–1.35)
Calcium: 8.4 mg/dL (ref 8.4–10.5)
Calcium: 8.8 mg/dL (ref 8.4–10.5)
Chloride: 109 mmol/L (ref 96–112)
Creatinine, Ser: 1.46 mg/dL — ABNORMAL HIGH (ref 0.50–1.35)
Creatinine, Ser: 1.15 mg/dL (ref 0.50–1.35)
GFR calc non Af Amer: 47 mL/min — ABNORMAL LOW (ref >90)
GFR calc non Af Amer: 59 mL/min — ABNORMAL LOW (ref >90)
GFR, EST AFRICAN AMERICAN: 54 mL/min — AB (ref >90)
GFR, EST AFRICAN AMERICAN: 69 mL/min — AB (ref >90)
GFR, EST NON AFRICAN AMERICAN: 79 mL/min — AB (ref >90)
Glucose, Bld: 407 mg/dL — ABNORMAL HIGH (ref 70–99)
Glucose, Bld: 246 mg/dL — ABNORMAL HIGH (ref 70–99)
Glucose, Bld: 232 mg/dL — ABNORMAL HIGH (ref 70–99)
Potassium: 5.9 mmol/L — ABNORMAL HIGH (ref 3.5–5.1)
Potassium: 5 mmol/L (ref 3.5–5.1)
Potassium: 4.2 mmol/L (ref 3.5–5.1)
SODIUM: 135 mmol/L (ref 135–145)
SODIUM: 138 mmol/L (ref 135–145)
Sodium: 137 mmol/L (ref 135–145)

## 2014-10-10 LAB — URINALYSIS, ROUTINE W REFLEX MICROSCOPIC
BILIRUBIN URINE: NEGATIVE
HGB URINE DIPSTICK: NEGATIVE
LEUKOCYTES UA: NEGATIVE
Nitrite: NEGATIVE
PH: 5 (ref 5.0–8.0)
Protein, ur: 30 mg/dL — AB
Specific Gravity, Urine: 1.026 (ref 1.005–1.030)
Urobilinogen, UA: 0.2 mg/dL (ref 0.0–1.0)

## 2014-10-10 LAB — CBC WITH DIFFERENTIAL/PLATELET
Basophils Absolute: 0 10*3/uL (ref 0.0–0.1)
Basophils Relative: 0 % (ref 0–1)
EOS ABS: 0 10*3/uL (ref 0.0–0.7)
Eosinophils Relative: 0 % (ref 0–5)
HCT: 48.2 % (ref 39.0–52.0)
HEMOGLOBIN: 16.6 g/dL (ref 13.0–17.0)
Lymphocytes Relative: 7 % — ABNORMAL LOW (ref 12–46)
Lymphs Abs: 0.7 10*3/uL (ref 0.7–4.0)
MCH: 32.3 pg (ref 26.0–34.0)
MCHC: 34.4 g/dL (ref 30.0–36.0)
MCV: 93.8 fL (ref 78.0–100.0)
Monocytes Absolute: 0.2 10*3/uL (ref 0.1–1.0)
Monocytes Relative: 2 % — ABNORMAL LOW (ref 3–12)
NEUTROS ABS: 8.6 10*3/uL — AB (ref 1.7–7.7)
Neutrophils Relative %: 91 % — ABNORMAL HIGH (ref 43–77)
PLATELETS: 242 10*3/uL (ref 150–400)
RBC: 5.14 MIL/uL (ref 4.22–5.81)
RDW: 12.3 % (ref 11.5–15.5)
WBC: 9.5 10*3/uL (ref 4.0–10.5)

## 2014-10-10 LAB — URINE MICROSCOPIC-ADD ON

## 2014-10-10 LAB — GLUCOSE, CAPILLARY
GLUCOSE-CAPILLARY: 168 mg/dL — AB (ref 70–99)
GLUCOSE-CAPILLARY: 226 mg/dL — AB (ref 70–99)
GLUCOSE-CAPILLARY: 174 mg/dL — AB (ref 70–99)
Glucose-Capillary: 232 mg/dL — ABNORMAL HIGH (ref 70–99)
Glucose-Capillary: 188 mg/dL — ABNORMAL HIGH (ref 70–99)

## 2014-10-10 LAB — TROPONIN I: Troponin I: <0.03 ng/mL (ref <0.031)

## 2014-10-10 LAB — CBG MONITORING, ED
GLUCOSE-CAPILLARY: 465 mg/dL — AB (ref 70–99)
Glucose-Capillary: 482 mg/dL — ABNORMAL HIGH (ref 70–99)
Glucose-Capillary: 416 mg/dL — ABNORMAL HIGH (ref 70–99)
Glucose-Capillary: 337 mg/dL — ABNORMAL HIGH (ref 70–99)
Glucose-Capillary: 250 mg/dL — ABNORMAL HIGH (ref 70–99)

## 2014-10-10 LAB — MAGNESIUM: Magnesium: 2.2 mg/dL (ref 1.5–2.5)

## 2014-10-10 LAB — PHOSPHORUS: Phosphorus: 4.1 mg/dL (ref 2.3–4.6)

## 2014-10-10 LAB — MRSA PCR SCREENING: MRSA by PCR: NEGATIVE

## 2014-10-10 MED ORDER — SODIUM CHLORIDE 0.9 % IV SOLN
INTRAVENOUS | Status: DC
  Administered 2014-10-10: 14:00:00 via INTRAVENOUS

## 2014-10-10 MED ORDER — INSULIN REGULAR HUMAN 100 UNIT/ML IJ SOLN
INTRAMUSCULAR | Status: DC
  Filled 2014-10-10: qty 2.5

## 2014-10-10 MED ORDER — ONDANSETRON HCL 4 MG/2ML IJ SOLN: 4.0000 mg | Freq: Four times a day (QID) | INTRAMUSCULAR | Status: DC | PRN

## 2014-10-10 MED ORDER — GI COCKTAIL ~~LOC~~
30.0000 mL | Freq: Once | ORAL | Status: AC
  Administered 2014-10-10: 30 mL via ORAL
  Filled 2014-10-10: qty 30

## 2014-10-10 MED ORDER — DEXTROSE-NACL 5-0.45 % IV SOLN
INTRAVENOUS | Status: DC
  Administered 2014-10-10: 18:00:00 via INTRAVENOUS

## 2014-10-10 MED ORDER — SODIUM CHLORIDE 0.9 % IV BOLUS (SEPSIS)
2000.0000 mL | Freq: Once | INTRAVENOUS | Status: AC
  Administered 2014-10-10: 2000 mL via INTRAVENOUS

## 2014-10-10 MED ORDER — SODIUM CHLORIDE 0.9 % IV SOLN
INTRAVENOUS | Status: DC
  Administered 2014-10-10: 4.1 [IU]/h via INTRAVENOUS
  Filled 2014-10-10: qty 2.5

## 2014-10-10 MED ORDER — ENOXAPARIN SODIUM 30 MG/0.3ML ~~LOC~~ SOLN
30.0000 mg | SUBCUTANEOUS | Status: DC
  Filled 2014-10-10 (×2): qty 0.3

## 2014-10-10 MED ORDER — CETYLPYRIDINIUM CHLORIDE 0.05 % MT LIQD
7.0000 mL | Freq: Two times a day (BID) | OROMUCOSAL | Status: DC
  Administered 2014-10-10 – 2014-10-12 (×2): 7 mL via OROMUCOSAL

## 2014-10-10 MED ORDER — PANTOPRAZOLE SODIUM 40 MG IV SOLR
40.0000 mg | INTRAVENOUS | Status: DC
  Administered 2014-10-10 – 2014-10-11 (×2): 40 mg via INTRAVENOUS
  Filled 2014-10-10 (×3): qty 40

## 2014-10-10 MED ORDER — SODIUM CHLORIDE 0.9 % IV BOLUS (SEPSIS): 1000.0000 mL | Freq: Once | INTRAVENOUS | Status: DC

## 2014-10-10 MED ORDER — DEXTROSE-NACL 5-0.45 % IV SOLN: INTRAVENOUS | Status: DC

## 2014-10-10 MED ORDER — GLUCERNA SHAKE PO LIQD
237.0000 mL | Freq: Once | ORAL | Status: DC
  Filled 2014-10-10: qty 237

## 2014-10-10 NOTE — ED Notes (Signed)
Eddie Dibbles, NP from CCM at bedside.

## 2014-10-10 NOTE — ED Notes (Signed)
DR. Darl Householder at bedside.

## 2014-10-10 NOTE — ED Provider Notes (Signed)
CSN: [id number]     Arrival date & time 10/10/14  1020 History   First MD Initiated Contact with Patient 10/10/14 1022     Chief Complaint  Patient presents with  . Hyperglycemia     (Consider location/radiation/quality/duration/timing/severity/associated sxs/prior Treatment) The history is provided by the patient.  Jared Murray is a 39 y.o. male hx of insulin dependent diabetes, psoriasis, here with vomiting, dehydration. Patient ate some bad food over the weekend and then had some diarrhea. Yesterday, started having vomiting more than 10 times. Unable to keep any fluids down. Denies abdominal pain. Denies fevers or chest pain. He has hx of DKA previously. He did take his lantus today. His CBG was 190 yesterday and > 600 today as per EMS.    Past Medical History  Diagnosis Date  . Eczema   . Type 1 diabetes mellitus   . Psoriasis    Past Surgical History  Procedure Laterality Date  . Finger surgery     Family History  Problem Relation Age of Onset  . Hypertension Father   . Diabetes      multiple  . Lupus Cousin   . Stroke Maternal Grandmother   . Stroke Paternal Grandmother    History  Substance Use Topics  . Smoking status: Current Every Day Smoker  . Smokeless tobacco: Not on file  . Alcohol Use: Yes     Comment: recently quit - 7/14     Review of Systems  Gastrointestinal: Positive for vomiting.  All other systems reviewed and are negative.     Allergies  Penicillins  Home Medications   Prior to Admission medications   Medication Sig Start Date End Date Taking? Authorizing Provider  clobetasol cream (TEMOVATE) 4.09 % Apply 1 application topically 2 (two) times daily. 08/04/14  Yes Deepak Advani, MD  insulin glargine (LANTUS) 100 UNIT/ML injection Inject 0.33 mLs (33 Units total) into the skin daily. Patient taking differently: Inject 30 Units into the skin daily.  09/19/14  Yes Lorayne Marek, MD  Insulin Syringe-Needle U-100 (B-D INS SYRINGE  2CC/29GX1/2") 29G X 1/2" 2 ML MISC Check Blood sugar TID and QHS 03/22/14  Yes Olugbemiga E Jegede, MD  NOVOLOG 100 UNIT/ML injection Sliding scale CBG 70 - 120: 0 units CBG 121 - 150: 1 unit,  CBG 151 - 200: 2 units,  CBG 201 - 250: 3 units,  CBG 251 - 300: 5 units,  CBG 301 - 350: 7 units,  CBG 351 - 400: 9 units   CBG > 400: 10 units 09/21/14  Yes Deepak Advani, MD  triamcinolone cream (KENALOG) 0.1 % Apply 1 application topically 2 (two) times daily. 08/04/14  Yes Lorayne Marek, MD  ibuprofen (ADVIL,MOTRIN) 600 MG tablet Take 1 tablet (600 mg total) by mouth every 8 (eight) hours as needed. Patient not taking: Reported on 08/04/2014 05/10/14   Lorayne Marek, MD  pantoprazole (PROTONIX) 40 MG tablet Take 1 tablet (40 mg total) by mouth daily. Patient not taking: Reported on 10/10/2014 07/03/14   Phillips Climes, MD   BP 132/89 mmHg  Pulse 103  Temp(Src) 97.6 F (36.4 C) (Oral)  Resp 19  SpO2 100% Physical Exam  Constitutional: He is oriented to person, place, and time.  Dehydrated   HENT:  Head: Normocephalic.  MM dry   Eyes: Conjunctivae are normal. Pupils are equal, round, and reactive to light.  Neck: Normal range of motion. Neck supple.  Cardiovascular: Regular rhythm and normal heart sounds.   Tachy  Pulmonary/Chest: Effort normal and breath sounds normal. No respiratory distress. He has no wheezes. He has no rales.  Abdominal: Soft. Bowel sounds are normal. He exhibits no distension. There is no tenderness. There is no rebound and no guarding.  Musculoskeletal: Normal range of motion. He exhibits no edema or tenderness.  Neurological: He is alert and oriented to person, place, and time. No cranial nerve deficit. Coordination normal.  Skin: Skin is warm and dry.  Psychiatric: He has a normal mood and affect. His behavior is normal. Judgment and thought content normal.  Nursing note and vitals reviewed.   ED Course  Procedures (including critical care time)  CRITICAL  CARE Performed by: Darl Householder, Corwin Kuiken   Total critical care time: 30 min   Critical care time was exclusive of separately billable procedures and treating other patients.  Critical care was necessary to treat or prevent imminent or life-threatening deterioration.  Critical care was time spent personally by me on the following activities: development of treatment plan with patient and/or surrogate as well as nursing, discussions with consultants, evaluation of patient's response to treatment, examination of patient, obtaining history from patient or surrogate, ordering and performing treatments and interventions, ordering and review of laboratory studies, ordering and review of radiographic studies, pulse oximetry and re-evaluation of patient's condition.   Labs Review Labs Reviewed  CBC WITH DIFFERENTIAL/PLATELET - Abnormal; Notable for the following:    Neutrophils Relative % 91 (*)    Neutro Abs 8.6 (*)    Lymphocytes Relative 7 (*)    Monocytes Relative 2 (*)    All other components within normal limits  COMPREHENSIVE METABOLIC PANEL - Abnormal; Notable for the following:    Potassium 6.1 (*)    CO2 6 (*)    Glucose, Bld 566 (*)    Creatinine, Ser 2.02 (*)    Total Protein 8.9 (*)    AST 48 (*)    Alkaline Phosphatase 119 (*)    Total Bilirubin 1.7 (*)    GFR calc non Af Amer 40 (*)    GFR calc Af Amer 47 (*)    Anion gap 33 (*)    All other components within normal limits  I-STAT VENOUS BLOOD GAS, ED - Abnormal; Notable for the following:    pH, Ven 7.095 (*)    pCO2, Ven 23.3 (*)    pO2, Ven 57.0 (*)    Bicarbonate 7.1 (*)    Acid-base deficit 21.0 (*)    All other components within normal limits  URINALYSIS, ROUTINE W REFLEX MICROSCOPIC  CBG MONITORING, ED    Imaging Review No results found.   EKG Interpretation None      MDM   Final diagnoses:  None    Jared Murray is a 39 y.o. male hx of type 1 diabetes here with vomiting. Concerned for possible DKA.  Will get labs, UA, VBG. Will hydrate and start IV insulin.   12:23 PM Potassium 6.1. Glucose 566, CO2 6. PH 7.0. Given 2 L NS bolus. Started on insulin drip. Didn't give meds for hyperkalemia as K will drop with treatment. Also has acute renal failure. Critical consulted to admit.    Wandra Arthurs, MD 10/10/14 1224

## 2014-10-10 NOTE — ED Notes (Signed)
Pt reports having N/V/D beginning yesterday afternoon and into this morning. Pt reports sister called EMS b/c he "looked dehydrated". Pt is currently taking Lantus and Novolog. Pt reports yesterday his CBG was 190 and he took his Lantus today. EMS reports CBG >600. Pt had 8mg  of Zofran PTA.

## 2014-10-10 NOTE — ED Notes (Signed)
CO2 6 as reported by Thayer Jew from lab.

## 2014-10-10 NOTE — H&P (Signed)
PULMONARY / CRITICAL CARE MEDICINE   Name: Jared Murray MRN: [id number] DOB: September 17, 1975    ADMISSION DATE:  10/10/2014  REFERRING MD :  ER  CHIEF COMPLAINT:  DKA  INITIAL PRESENTATION:  39 yo male smoker with n/v, abd/chest pain from DKA.  STUDIES:   SIGNIFICANT EVENTS: 2/23 Admit   HISTORY OF PRESENT ILLNESS:   39 yo male reports that has started feeling abdominal and chest discomfort 2 days ago.  He denies any difficulty taking his insulin.  He started getting nausea with vomiting.  His chest pain would get better after he vomited.    He denies diarrhea, hematemesis, melena, dysuria, cough, sputum, skin rash, or fever.  He was last in hospital in November 2015 with DKA.  PAST MEDICAL HISTORY :   has a past medical history of Eczema; Type 1 diabetes mellitus; and Psoriasis.  has past surgical history that includes Finger surgery. Prior to Admission medications   Medication Sig Start Date End Date Taking? Authorizing Provider  clobetasol cream (TEMOVATE) 1.76 % Apply 1 application topically 2 (two) times daily. 08/04/14  Yes Deepak Advani, MD  insulin glargine (LANTUS) 100 UNIT/ML injection Inject 0.33 mLs (33 Units total) into the skin daily. Patient taking differently: Inject 30 Units into the skin daily.  09/19/14  Yes Lorayne Marek, MD  Insulin Syringe-Needle U-100 (B-D INS SYRINGE 2CC/29GX1/2") 29G X 1/2" 2 ML MISC Check Blood sugar TID and QHS 03/22/14  Yes Olugbemiga E Jegede, MD  NOVOLOG 100 UNIT/ML injection Sliding scale CBG 70 - 120: 0 units CBG 121 - 150: 1 unit,  CBG 151 - 200: 2 units,  CBG 201 - 250: 3 units,  CBG 251 - 300: 5 units,  CBG 301 - 350: 7 units,  CBG 351 - 400: 9 units   CBG > 400: 10 units 09/21/14  Yes Deepak Advani, MD  triamcinolone cream (KENALOG) 0.1 % Apply 1 application topically 2 (two) times daily. 08/04/14  Yes Lorayne Marek, MD  ibuprofen (ADVIL,MOTRIN) 600 MG tablet Take 1 tablet (600 mg total) by mouth every 8 (eight) hours as  needed. Patient not taking: Reported on 08/04/2014 05/10/14   Lorayne Marek, MD  pantoprazole (PROTONIX) 40 MG tablet Take 1 tablet (40 mg total) by mouth daily. Patient not taking: Reported on 10/10/2014 07/03/14   Phillips Climes, MD   Allergies  Allergen Reactions  . Penicillins     Childhood allergy    FAMILY HISTORY:  has no family status information on file.  SOCIAL HISTORY:  reports that he has been smoking.  He does not have any smokeless tobacco history on file. He reports that he drinks alcohol. He reports that he uses illicit drugs (Marijuana).  REVIEW OF SYSTEMS:   Negative except above  SUBJECTIVE:   VITAL SIGNS: Temp:  [97.6 F (36.4 C)] 97.6 F (36.4 C) (02/23 1033) Pulse Rate:  [89-106] 96 (02/23 1215) Resp:  [17-23] 17 (02/23 1215) BP: (105-145)/(79-106) 144/88 mmHg (02/23 1215) SpO2:  [98 %-100 %] 98 % (02/23 1215) INTAKE / OUTPUT: No intake or output data in the 24 hours ending 10/10/14 1242  PHYSICAL EXAMINATION: General: no distress Neuro:  Alert, normal strength, CN intact HEENT:  No sinus tenderness, pupils reactive, no oral exudate, no LAN Cardiovascular:  Regular, no murmur Lungs:  No wheeze Abdomen:  Soft, non tender, decreased bowel sounds Musculoskeletal:  No edema Skin:  No rashes  LABS:  CBC  Recent Labs Lab 10/10/14 1103  WBC 9.5  HGB 16.6  HCT 48.2  PLT 242   BMET  Recent Labs Lab 10/10/14 1103  NA 135  K 6.1*  CL 96  CO2 6*  BUN 22  CREATININE 2.02*  GLUCOSE 566*   Electrolytes  Recent Labs Lab 10/10/14 1103  CALCIUM 9.0   Sepsis Markers No results for input(s): LATICACIDVEN, PROCALCITON, O2SATVEN in the last 168 hours.   ABG No results for input(s): PHART, PCO2ART, PO2ART in the last 168 hours.   Liver Enzymes  Recent Labs Lab 10/10/14 1103  AST 48*  ALT 49  ALKPHOS 119*  BILITOT 1.7*  ALBUMIN 5.2   Cardiac Enzymes No results for input(s): TROPONINI, PROBNP in the last 168 hours.    Glucose No results for input(s): GLUCAP in the last 168 hours.  Imaging No results found.   ASSESSMENT / PLAN:  PULMONARY A: Acute respiratory distress 2nd to acidosis. Tobacco abuse. P:   F/u CXR Oxygen prn to keep SpO2 > 92% Smoking cessation  CARDIOVASCULAR A:  Sinus tachycardia 2nd to volume depletion. P:  Monitor hemodynamics Check ECG, cardiac enzymes  RENAL A:   AKI from volume depletion >> baseline creatinine 0.54 from 07/03/14. Hyperkalemia. Metabolic acidosis 2nd to DKA. P:   IV fluids F/u BMET q4h Check magnesium, phosphorus Monitor renal fx, urine outpt  GASTROINTESTINAL A:   Gastroenteritis. P:   NPO PRN zofran Protonix for SUP  HEMATOLOGIC A:   No acute issues. P:  F/u CBC Lovenox for DVT prevention  INFECTIOUS A:  No evidence for bacterial infection. P:   Monitor off Abx  Blood cx 2/23 >>   ENDOCRINE A:   DKA. P:   IV insulin, IV fluids per DKA protocol Continue IV insulin until anion gap corrected and bicarb corrected Transition to Dextrose in IV fluid when blood sugar < 250  NEUROLOGIC A:   No acute issues. P:   Monitor mental status  CC time 40 minutes.  Chesley Mires, MD [facility] Pulmonary/Critical Care 10/10/2014, 12:56 PM Pager:  [phone] After 3pm call: [phone]

## 2014-10-10 NOTE — ED Notes (Signed)
Pt reports experiencing some acid reflux.

## 2014-10-10 NOTE — ED Notes (Signed)
CBG: 482 

## 2014-10-10 NOTE — ED Notes (Signed)
Admitting MD at bedside.

## 2014-10-10 NOTE — ED Notes (Signed)
CBG 337, Insulin rate decreased to 5.5 U/hr. Verified with Kandee Keen, RN

## 2014-10-11 DIAGNOSIS — N179 Acute kidney failure, unspecified: Secondary | ICD-10-CM

## 2014-10-11 LAB — BASIC METABOLIC PANEL
ANION GAP: 7 (ref 5–15)
Anion gap: 3 — ABNORMAL LOW (ref 5–15)
Anion gap: 6 (ref 5–15)
BUN: 11 mg/dL (ref 6–23)
BUN: 10 mg/dL (ref 6–23)
BUN: 5 mg/dL — ABNORMAL LOW (ref 6–23)
CALCIUM: 8.5 mg/dL (ref 8.4–10.5)
CHLORIDE: 109 mmol/L (ref 96–112)
CO2: 22 mmol/L (ref 19–32)
CO2: 20 mmol/L (ref 19–32)
CO2: 26 mmol/L (ref 19–32)
Calcium: 8.4 mg/dL (ref 8.4–10.5)
Calcium: 8.6 mg/dL (ref 8.4–10.5)
Chloride: 111 mmol/L (ref 96–112)
Chloride: 99 mmol/L (ref 96–112)
Creatinine, Ser: 0.87 mg/dL (ref 0.50–1.35)
Creatinine, Ser: 0.79 mg/dL (ref 0.50–1.35)
Creatinine, Ser: 0.83 mg/dL (ref 0.50–1.35)
GFR calc Af Amer: >90 mL/min (ref >90)
GFR calc Af Amer: >90 mL/min (ref >90)
GFR calc non Af Amer: >90 mL/min (ref >90)
GFR calc non Af Amer: >90 mL/min (ref >90)
GLUCOSE: 118 mg/dL — AB (ref 70–99)
GLUCOSE: 77 mg/dL (ref 70–99)
Glucose, Bld: 256 mg/dL — ABNORMAL HIGH (ref 70–99)
POTASSIUM: 3.3 mmol/L — AB (ref 3.5–5.1)
POTASSIUM: 3.3 mmol/L — AB (ref 3.5–5.1)
POTASSIUM: 3.9 mmol/L (ref 3.5–5.1)
Sodium: 136 mmol/L (ref 135–145)
Sodium: 135 mmol/L (ref 135–145)
Sodium: 132 mmol/L — ABNORMAL LOW (ref 135–145)

## 2014-10-11 LAB — GLUCOSE, CAPILLARY
GLUCOSE-CAPILLARY: 146 mg/dL — AB (ref 70–99)
GLUCOSE-CAPILLARY: 147 mg/dL — AB (ref 70–99)
GLUCOSE-CAPILLARY: 235 mg/dL — AB (ref 70–99)
GLUCOSE-CAPILLARY: 73 mg/dL (ref 70–99)
GLUCOSE-CAPILLARY: 94 mg/dL (ref 70–99)
GLUCOSE-CAPILLARY: 99 mg/dL (ref 70–99)
Glucose-Capillary: 54 mg/dL — ABNORMAL LOW (ref 70–99)
Glucose-Capillary: 55 mg/dL — ABNORMAL LOW (ref 70–99)
Glucose-Capillary: 73 mg/dL (ref 70–99)
Glucose-Capillary: 75 mg/dL (ref 70–99)
Glucose-Capillary: 79 mg/dL (ref 70–99)
Glucose-Capillary: 82 mg/dL (ref 70–99)
Glucose-Capillary: 81 mg/dL (ref 70–99)
Glucose-Capillary: 152 mg/dL — ABNORMAL HIGH (ref 70–99)
Glucose-Capillary: 117 mg/dL — ABNORMAL HIGH (ref 70–99)
Glucose-Capillary: 185 mg/dL — ABNORMAL HIGH (ref 70–99)

## 2014-10-11 LAB — CBC
HCT: 39.7 % (ref 39.0–52.0)
HEMOGLOBIN: 14 g/dL (ref 13.0–17.0)
MCH: 31.8 pg (ref 26.0–34.0)
MCHC: 35.3 g/dL (ref 30.0–36.0)
MCV: 90.2 fL (ref 78.0–100.0)
Platelets: 193 10*3/uL (ref 150–400)
RBC: 4.4 MIL/uL (ref 4.22–5.81)
RDW: 12.2 % (ref 11.5–15.5)
WBC: 6.2 10*3/uL (ref 4.0–10.5)

## 2014-10-11 MED ORDER — DEXTROSE 50 % IV SOLN
INTRAVENOUS | Status: AC
  Filled 2014-10-11: qty 50

## 2014-10-11 MED ORDER — INSULIN GLARGINE 100 UNIT/ML ~~LOC~~ SOLN
10.0000 [IU] | SUBCUTANEOUS | Status: DC
  Administered 2014-10-11: 10 [IU] via SUBCUTANEOUS
  Filled 2014-10-11 (×2): qty 0.1

## 2014-10-11 MED ORDER — DEXTROSE 50 % IV SOLN
50.0000 mL | Freq: Once | INTRAVENOUS | Status: AC
  Administered 2014-10-11: 50 mL via INTRAVENOUS

## 2014-10-11 MED ORDER — INSULIN ASPART 100 UNIT/ML ~~LOC~~ SOLN
2.0000 [IU] | SUBCUTANEOUS | Status: DC
  Administered 2014-10-11: 6 [IU] via SUBCUTANEOUS
  Administered 2014-10-11: 4 [IU] via SUBCUTANEOUS

## 2014-10-11 MED ORDER — DEXTROSE 10 % IV SOLN: INTRAVENOUS | Status: DC | PRN

## 2014-10-11 MED ORDER — POTASSIUM CHLORIDE CRYS ER 20 MEQ PO TBCR
40.0000 meq | EXTENDED_RELEASE_TABLET | ORAL | Status: AC
  Administered 2014-10-11 (×2): 40 meq via ORAL
  Filled 2014-10-11 (×2): qty 2

## 2014-10-11 NOTE — Progress Notes (Signed)
[facility] ADULT ICU REPLACEMENT PROTOCOL FOR AM LAB REPLACEMENT ONLY  The patient does not apply for the [facility] Adult ICU Electrolyte Replacment Protocol based on the criteria listed below:       Abnormal electrolyte(s):K3.3   If a panic level lab has been reported, has the CCM MD in charge been notified? yes Physician:  D Simonds,MD  Vear Clock 10/11/2014 4:51 AM

## 2014-10-11 NOTE — Progress Notes (Signed)
Spoke with patient regarding DKA.  He states that he thinks he got a "stomach virus" which started with diarrhea and eventually led to vomiting.  He states that he did take his Lantus insulin as prescribed.  He see's MD at [facility] and has insulin at home.  He also has meter and strips.  Briefly discussed the role of dehydration and DKA.  He states that he has no further questions at this time. May benefit from follow-up with CDE at [facility].  Will place order per protocol.  Thanks, Adah Perl, RN, BC-ADM Inpatient Diabetes Coordinator Pager [phone]

## 2014-10-11 NOTE — Progress Notes (Signed)
[facility] Progress Note Patient Name: Jared Murray DOB: 1975-12-08 MRN: [id number]   Date of Service  10/11/2014  HPI/Events of Note  hypoK+  eICU Interventions  Repleted     Intervention Category Intermediate Interventions: Electrolyte abnormality - evaluation and management  Merton Border 10/11/2014, 5:28 AM

## 2014-10-11 NOTE — Progress Notes (Signed)
Patient transferred from [place] room 1 to 5W room 4, after report called to receiving RN.  All questions answered.   Patient's meds, chart, and personal belongings all sent with patient. Patient transferred via wheelchair to new room location.  Safety measures maintained.  Doran Clay, RN

## 2014-10-11 NOTE — Progress Notes (Signed)
Hypoglycemic Event  CBG: 54; 15 min recheck= 55, Treated X2 per glucose stabilizer instruction   Treatment: D50  18 mls X2 per glucose stabilizer instructions; 36 mls total   Symptoms: None  Follow-up CBG: Time:0045 CBG Result: 145  Possible Reasons for Event: Patient on glucose stabilizer  Comments/MD notified: [place] F  Remember to initiate Hypoglycemia Order Set & complete

## 2014-10-11 NOTE — Progress Notes (Signed)
Inpatient Diabetes Program Recommendations  AACE/ADA: New Consensus Statement on Inpatient Glycemic Control (2013)  Target Ranges:  Prepandial:   less than 140 mg/dL      Peak postprandial:   less than 180 mg/dL (1-2 hours)      Critically ill patients:  140 - 180 mg/dL   Reason for Assessment:  Patient admitted for DKA.  He is a patient at [facility].  Diabetes history: Type 1 diabetes Outpatient Diabetes medications: Lantus 33 units daily, Novolog correction scale Current orders for Inpatient glycemic control:  Patient is currently on IV insulin.  Once acidosis cleared, consider adding Lantus 25 units daily and Novolog sensitive tid with meals.  Will follow-up and discuss DKA with patient.  Thanks, Adah Perl, RN, BC-ADM Inpatient Diabetes Coordinator Pager [phone]

## 2014-10-11 NOTE — Progress Notes (Signed)
PULMONARY / CRITICAL CARE MEDICINE   Name: ZEPPELIN COMMISSO MRN: [id number] DOB: [birth year]    ADMISSION DATE:  10/10/2014  REFERRING MD :  ER  CHIEF COMPLAINT:  DKA  INITIAL PRESENTATION:  39 yo male smoker with n/v, abd/chest pain from DKA.  STUDIES:   SIGNIFICANT EVENTS: 2/23 Admit 2/24 AG closed, insulin gtt stopped, transfer to floor.   SUBJECTIVE:  No distress, feels back to normal. Taking small amounts PO at this time.   VITAL SIGNS: Temp:  [97.3 F (36.3 C)-98.2 F (36.8 C)] 98.2 F (36.8 C) (02/24 0729) Pulse Rate:  [65-108] 65 (02/24 0900) Resp:  [10-25] 25 (02/24 0900) BP: (105-145)/(67-106) 113/74 mmHg (02/24 0900) SpO2:  [98 %-100 %] 100 % (02/24 0900) Weight:  [57.5 kg (126 lb 12.2 oz)] 57.5 kg (126 lb 12.2 oz) (02/24 0400) INTAKE / OUTPUT:  Intake/Output Summary (Last 24 hours) at 10/11/14 1009 Last data filed at 10/11/14 0900  Gross per 24 hour  Intake 1709.41 ml  Output    100 ml  Net 1609.41 ml    PHYSICAL EXAMINATION: General: no distress Neuro:  Alert, normal strength, CN intact HEENT:  [place]/AT, PERRL, no JVD Cardiovascular:  Regular, no murmur Lungs:  Clear bilateral breath sounds.  Abdomen:  Soft, non tender, decreased bowel sounds Musculoskeletal:  No edema Skin:  Hypopigmentation to bilateral feet > ankles.   LABS:  CBC  Recent Labs Lab 10/10/14 1103 10/11/14 0314  WBC 9.5 6.2  HGB 16.6 14.0  HCT 48.2 39.7  PLT 242 193   BMET  Recent Labs Lab 10/10/14 2040 10/11/14 0110 10/11/14 0447  NA 138 136 135  K 4.2 3.3* 3.3*  CL 107 111 109  CO2 18* 22 20  BUN 13 11 10   CREATININE 1.15 0.87 0.79  GLUCOSE 232* 118* 77   Electrolytes  Recent Labs Lab 10/10/14 1424  10/10/14 2040 10/11/14 0110 10/11/14 0447  CALCIUM 8.4  < > 8.8 8.5 8.4  MG 2.2  --   --   --   --   PHOS 4.1  --   --   --   --   < > = values in this interval not displayed. Sepsis Markers No results for input(s): LATICACIDVEN, PROCALCITON, O2SATVEN  in the last 168 hours.   ABG No results for input(s): PHART, PCO2ART, PO2ART in the last 168 hours.   Liver Enzymes  Recent Labs Lab 10/10/14 1103  AST 48*  ALT 49  ALKPHOS 119*  BILITOT 1.7*  ALBUMIN 5.2   Cardiac Enzymes  Recent Labs Lab 10/10/14 1424 10/10/14 1840  TROPONINI <0.03 <0.03     Glucose  Recent Labs Lab 10/11/14 0245 10/11/14 0349 10/11/14 0450 10/11/14 0555 10/11/14 0650 10/11/14 0752  GLUCAP 73 73 75 82 79 81    Imaging Portable Chest X-ray (1 View)  10/10/2014   CLINICAL DATA:  Respiratory distress  EXAM: PORTABLE CHEST - 1 VIEW  COMPARISON:  06/30/2014  FINDINGS: The heart size and mediastinal contours are within normal limits. Both lungs are clear. The visualized skeletal structures are unremarkable.  IMPRESSION: No active disease.   Electronically Signed   By: Inez Catalina M.D.   On: 10/10/2014 13:38     ASSESSMENT / PLAN:  PULMONARY A: Acute respiratory distress 2nd to acidosis (resolved) Tobacco abuse P:   Oxygen PRN to keep SpO2 > 92% Smoking cessation counseling  CARDIOVASCULAR A:  Sinus tachycardia 2nd to volume depletion (resolved) P:  Monitor hemodynamics Repeat ECG  RENAL  A:   AKI from volume depletion >> baseline creatinine 0.54 from 07/03/14. (resolved 2/24) Hypokalemia Hyperkalemia (resolved) Metabolic acidosis 2nd to DKA (resolved) P:   Bmet to q 12 hours to daily Monitor renal fx, urine outpt [place] closed  GASTROINTESTINAL A:   Gastroenteritis Elevated AST P:   Clear liquid diet, advance as tolerated.  PRN zofran Protonix for SUP Follow LFT  HEMATOLOGIC A:   No acute issues P:  F/u CBC Lovenox for DVT prevention  INFECTIOUS A:  No evidence for bacterial infection. P:   Monitor off Abx  Blood cx 2/23 >>   ENDOCRINE A:   DKA P:   Transition to Lantus , SSI CBG monitoring q 4 hours  NEUROLOGIC A:   No acute issues P:   Monitor mental status   Georgann Housekeeper, AGACNP-BC [facility]  Pulmonology/Critical Care Pager [phone] or [phone]   .STAFF NOTE: I, Merrie Roof, MD FACP have personally reviewed patient's available data, including medical history, events of note, physical examination and test results as part of my evaluation. I have discussed with resident/NP and other care providers such as pharmacist, RN and RRT. In addition, I personally evaluated patient and elicited key findings of:  lantus given, resolved rapid, caution back to home dose yet of lantus, ssi, off drip, chem in am to traid floor  [employer]. Titus Mould, MD, [place] Pgr: [facility] 10/11/2014 11:56 AM

## 2014-10-11 NOTE — Progress Notes (Signed)
UR Completed.  [phone]  

## 2014-10-12 LAB — BASIC METABOLIC PANEL
ANION GAP: 8 (ref 5–15)
BUN: 6 mg/dL (ref 6–23)
CALCIUM: 8.7 mg/dL (ref 8.4–10.5)
CO2: 24 mmol/L (ref 19–32)
Chloride: 104 mmol/L (ref 96–112)
Creatinine, Ser: 0.65 mg/dL (ref 0.50–1.35)
GFR calc non Af Amer: >90 mL/min (ref >90)
Glucose, Bld: 102 mg/dL — ABNORMAL HIGH (ref 70–99)
Potassium: 3.2 mmol/L — ABNORMAL LOW (ref 3.5–5.1)
SODIUM: 136 mmol/L (ref 135–145)

## 2014-10-12 LAB — CBC
HEMATOCRIT: 37.5 % — AB (ref 39.0–52.0)
Hemoglobin: 13.3 g/dL (ref 13.0–17.0)
MCH: 32.2 pg (ref 26.0–34.0)
MCHC: 35.5 g/dL (ref 30.0–36.0)
MCV: 90.8 fL (ref 78.0–100.0)
PLATELETS: 168 10*3/uL (ref 150–400)
RBC: 4.13 MIL/uL — ABNORMAL LOW (ref 4.22–5.81)
RDW: 12.2 % (ref 11.5–15.5)
WBC: 2.9 10*3/uL — AB (ref 4.0–10.5)

## 2014-10-12 LAB — GLUCOSE, CAPILLARY
GLUCOSE-CAPILLARY: 149 mg/dL — AB (ref 70–99)
Glucose-Capillary: 108 mg/dL — ABNORMAL HIGH (ref 70–99)

## 2014-10-12 MED ORDER — POTASSIUM CHLORIDE CRYS ER 20 MEQ PO TBCR
40.0000 meq | EXTENDED_RELEASE_TABLET | Freq: Once | ORAL | Status: AC
  Administered 2014-10-12: 40 meq via ORAL
  Filled 2014-10-12: qty 2

## 2014-10-12 MED ORDER — INSULIN GLARGINE 100 UNIT/ML ~~LOC~~ SOLN: 30.0000 [IU] | Freq: Every day | SUBCUTANEOUS | Status: DC

## 2014-10-12 MED ORDER — PANTOPRAZOLE SODIUM 40 MG PO TBEC
40.0000 mg | DELAYED_RELEASE_TABLET | Freq: Every day | ORAL | Status: DC
  Administered 2014-10-12: 40 mg via ORAL
  Filled 2014-10-12: qty 1

## 2014-10-12 MED ORDER — PROMETHAZINE HCL 12.5 MG PO TABS: 12.5000 mg | ORAL_TABLET | Freq: Four times a day (QID) | ORAL | Status: DC | PRN

## 2014-10-12 MED ORDER — PANTOPRAZOLE SODIUM 40 MG PO TBEC: 40.0000 mg | DELAYED_RELEASE_TABLET | Freq: Every day | ORAL | Status: DC

## 2014-10-12 MED ORDER — INSULIN GLARGINE 100 UNIT/ML ~~LOC~~ SOLN
30.0000 [IU] | Freq: Every day | SUBCUTANEOUS | Status: DC
  Administered 2014-10-12: 30 [IU] via SUBCUTANEOUS
  Filled 2014-10-12: qty 0.3

## 2014-10-12 MED ORDER — NOVOLOG 100 UNIT/ML ~~LOC~~ SOLN: SUBCUTANEOUS | Status: DC

## 2014-10-12 MED ORDER — INSULIN ASPART 100 UNIT/ML ~~LOC~~ SOLN: 0.0000 [IU] | Freq: Every day | SUBCUTANEOUS | Status: DC

## 2014-10-12 MED ORDER — LIVING WELL WITH DIABETES BOOK
Freq: Once | Status: AC
Start: 2014-10-12 — End: 2014-10-12
  Administered 2014-10-12: 14:00:00
  Filled 2014-10-12 (×2): qty 1

## 2014-10-12 MED ORDER — INSULIN ASPART 100 UNIT/ML ~~LOC~~ SOLN
0.0000 [IU] | Freq: Three times a day (TID) | SUBCUTANEOUS | Status: DC
  Administered 2014-10-12: 7 [IU] via SUBCUTANEOUS

## 2014-10-12 NOTE — Progress Notes (Signed)
NURSING PROGRESS NOTE  Jared Murray [id number] Discharge Data: 10/12/2014 3:24 PM Attending Provider: Mendel Corning, MD OXB:[id number], Vernon Prey, MD     [place] to be D/C'd Home per MD order.  Discussed with the patient the After Visit Summary and all questions fully answered. All IV's discontinued with no bleeding noted. All belongings returned to patient for patient to take home.   Last Vital Signs:  Blood pressure 114/80, pulse 84, temperature 98.6 F (37 C), temperature source Oral, resp. rate 18, height 5\' 10"  (1.778 m), weight 57.5 kg (126 lb 12.2 oz), SpO2 100 %.  Discharge Medication List   Medication List    TAKE these medications        clobetasol cream 0.05 %  Commonly known as:  TEMOVATE  Apply 1 application topically 2 (two) times daily.     ibuprofen 600 MG tablet  Commonly known as:  ADVIL,MOTRIN  Take 1 tablet (600 mg total) by mouth every 8 (eight) hours as needed.     insulin glargine 100 UNIT/ML injection  Commonly known as:  LANTUS  Inject 0.3 mLs (30 Units total) into the skin daily.     Insulin Syringe-Needle U-100 29G X 1/2" 2 ML Misc  Commonly known as:  B-D INS SYRINGE 2CC/29GX1/2"  Check Blood sugar TID and QHS     NOVOLOG 100 UNIT/ML injection  Generic drug:  insulin aspart  Sliding scale CBG 70 - 120: 0 units CBG 121 - 150: 1 unit,  CBG 151 - 200: 2 units,  CBG 201 - 250: 3 units,  CBG 251 - 300: 5 units,  CBG 301 - 350: 7 units,  CBG 351 - 400: 9 units   CBG > 400: 10 units     pantoprazole 40 MG tablet  Commonly known as:  PROTONIX  Take 1 tablet (40 mg total) by mouth daily.     pantoprazole 40 MG tablet  Commonly known as:  PROTONIX  Take 1 tablet (40 mg total) by mouth daily.     promethazine 12.5 MG tablet  Commonly known as:  PHENERGAN  Take 1 tablet (12.5 mg total) by mouth every 6 (six) hours as needed for nausea or vomiting.     triamcinolone cream 0.1 %  Commonly known as:  KENALOG  Apply 1 application topically 2  (two) times daily.

## 2014-10-12 NOTE — Discharge Summary (Signed)
Physician Discharge Summary  Patient ID: Jared Murray MRN: [id number] DOB/AGE: 39-Jul-1977 39 y.o.  Admit date: 10/10/2014 Discharge date: 10/12/2014  Primary Care Physician:  Lorayne Marek, MD  Discharge Diagnoses:    . DKA, type 1 Uncontrolled diabetes mellitus Acute kidney injury from volume depletion Sinus tachycardia secondary to volume depletion Acute gastroenteritis resolved Acute respiratory failure/distress due to acidosis GERD  Consults:   Patient was admitted by critical care service, transitioned care to hospitalist service   Recommendations for Outpatient Follow-up:  Please follow CBGs outpatient, hemoglobin A1c  TESTS THAT NEED FOLLOW-UP Hemoglobin A1c   DIET: Cardiac modified diet    Allergies:   Allergies  Allergen Reactions  . Penicillins     Childhood allergy     Discharge Medications:   Medication List    TAKE these medications        clobetasol cream 0.05 %  Commonly known as:  TEMOVATE  Apply 1 application topically 2 (two) times daily.     ibuprofen 600 MG tablet  Commonly known as:  ADVIL,MOTRIN  Take 1 tablet (600 mg total) by mouth every 8 (eight) hours as needed.     insulin glargine 100 UNIT/ML injection  Commonly known as:  LANTUS  Inject 0.3 mLs (30 Units total) into the skin daily.     Insulin Syringe-Needle U-100 29G X 1/2" 2 ML Misc  Commonly known as:  B-D INS SYRINGE 2CC/29GX1/2"  Check Blood sugar TID and QHS     NOVOLOG 100 UNIT/ML injection  Generic drug:  insulin aspart  Sliding scale CBG 70 - 120: 0 units CBG 121 - 150: 1 unit,  CBG 151 - 200: 2 units,  CBG 201 - 250: 3 units,  CBG 251 - 300: 5 units,  CBG 301 - 350: 7 units,  CBG 351 - 400: 9 units   CBG > 400: 10 units     pantoprazole 40 MG tablet  Commonly known as:  PROTONIX  Take 1 tablet (40 mg total) by mouth daily.     pantoprazole 40 MG tablet  Commonly known as:  PROTONIX  Take 1 tablet (40 mg total) by mouth daily.     promethazine 12.5  MG tablet  Commonly known as:  PHENERGAN  Take 1 tablet (12.5 mg total) by mouth every 6 (six) hours as needed for nausea or vomiting.     triamcinolone cream 0.1 %  Commonly known as:  KENALOG  Apply 1 application topically 2 (two) times daily.        Ambulate Brief H and P: For complete details please refer to admission H and P, but in brief please note patient was admitted by critical care service on 2/23. Patient is a 39 year old male with history of diabetes on insulin, reported that he was feeling nausea, vomiting, abdominal and chest discomfort 2 days prior to admission. He otherwise denied any difficulty taking his insulin. Patient chest pain was improved after he vomited.  Hospital Course:  DKA with uncontrolled diabetes mellitus At the time of admission patient was found to have bicarbonate of 6 with potassium of 6.1, glucose 566, creatinine 2.0, anion gap of 33. Patient was admitted to ICU  by critical care service. Metabolic acidosis was thought to be due to DKA. Patient was placed on aggressive IV fluid hydration and insulin drip.  He was transitioned to subcutaneous Lantus and sliding scale insulin on 10/11/14. Blood sugars have remained stable since then, patient is tolerating oral diet without any difficulty. Gastroenteritis  has resolved. Patient was followed by diabetic coordinator closely. Patient was restarted on home regimen of Lantus and sliding scale insulin. Likely his DKA was triggered by his acute viral gastroenteritis. He was recommended to follow up with his primary care physician at [facility]. Ambulatory referral was done for diabetic teaching outpatient.  Acute kidney injury with dehydration Creatinine was 2.02 and BUN 22 at the time of admission, potassium 6.1. Likely due to DKA. Patient was placed on aggressive IV fluid hydration due to DKA. His creatinine has improved to 0.65 at the time of discharge. Patient is tolerating oral diet without any  difficulty. Acute gastroenteritis has improved.  GERD Patient was placed on PPI. He is tolerating oral diet without any difficulty, no nausea or vomiting.  Acute gastroenteritis Resolved, no nausea or vomiting, tolerating oral diet. No diarrhea.  Day of Discharge BP 99/67 mmHg  Pulse 79  Temp(Src) 98.2 F (36.8 C) (Oral)  Resp 18  Ht 5\' 10"  (1.778 m)  Wt 57.5 kg (126 lb 12.2 oz)  BMI 18.19 kg/m2  SpO2 99%  Physical Exam: General: Alert and awake oriented x3 not in any acute distress. HEENT: anicteric sclera, pupils reactive to light and accommodation CVS: S1-S2 clear no murmur rubs or gallops Chest: clear to auscultation bilaterally, no wheezing rales or rhonchi Abdomen: soft nontender, nondistended, normal bowel sounds Extremities: no cyanosis, clubbing or edema noted bilaterally Neuro: Cranial nerves II-XII intact, no focal neurological deficits   The results of significant diagnostics from this hospitalization (including imaging, microbiology, ancillary and laboratory) are listed below for reference.    LAB RESULTS: Basic Metabolic Panel:  Recent Labs Lab 10/10/14 1424  10/11/14 1947 10/12/14 0540  NA 135  < > 132* 136  K 5.9*  < > 3.9 3.2*  CL 106  < > 99 104  CO2 6*  < > 26 24  GLUCOSE 407*  < > 256* 102*  BUN 17  < > 5* 6  CREATININE 1.78*  < > 0.83 0.65  CALCIUM 8.4  < > 8.6 8.7  MG 2.2  --   --   --   PHOS 4.1  --   --   --   < > = values in this interval not displayed. Liver Function Tests:  Recent Labs Lab 10/10/14 1103  AST 48*  ALT 49  ALKPHOS 119*  BILITOT 1.7*  PROT 8.9*  ALBUMIN 5.2   No results for input(s): LIPASE, AMYLASE in the last 168 hours. No results for input(s): AMMONIA in the last 168 hours. CBC:  Recent Labs Lab 10/10/14 1103 10/11/14 0314 10/12/14 0540  WBC 9.5 6.2 2.9*  NEUTROABS 8.6*  --   --   HGB 16.6 14.0 13.3  HCT 48.2 39.7 37.5*  MCV 93.8 90.2 90.8  PLT 242 193 168   Cardiac Enzymes:  Recent Labs Lab  10/10/14 1424 10/10/14 1840  TROPONINI <0.03 <0.03   BNP: Invalid input(s): POCBNP CBG:  Recent Labs Lab 10/12/14 0013 10/12/14 0412  GLUCAP 149* 108*    Significant Diagnostic Studies:  Portable Chest X-ray (1 View)  10/10/2014   CLINICAL DATA:  Respiratory distress  EXAM: PORTABLE CHEST - 1 VIEW  COMPARISON:  06/30/2014  FINDINGS: The heart size and mediastinal contours are within normal limits. Both lungs are clear. The visualized skeletal structures are unremarkable.  IMPRESSION: No active disease.   Electronically Signed   By: Inez Catalina M.D.   On: 10/10/2014 13:38    2D ECHO:  Disposition and Follow-up:     Discharge Instructions    Ambulatory referral to Nutrition and Diabetic Education    Complete by:  As directed   Type 1 diabetes/DKA admissions     Diet Carb Modified    Complete by:  As directed      Increase activity slowly    Complete by:  As directed             DISPOSITION:  Home   DISCHARGE FOLLOW-UP Follow-up Information    Follow up with Lorayne Marek, MD. Schedule an appointment as soon as possible for a visit in 1 week.   Specialty:  Internal Medicine   Why:  for hospital follow-up   Contact information:   [place] [id number] [phone]        Time spent on Discharge:  35 minutes  Signed:   RAI,RIPUDEEP M.D. [facility] 10/12/2014, 12:16 PM Pager: [phone]

## 2014-10-12 NOTE — Progress Notes (Signed)
Inpatient Diabetes Program Recommendations  AACE/ADA: New Consensus Statement on Inpatient Glycemic Control (2013)  Target Ranges:  Prepandial:   less than 140 mg/dL      Peak postprandial:   less than 180 mg/dL (1-2 hours)      Critically ill patients:  140 - 180 mg/dL   Spoke with patient by phone today regarding recent DKA. He has always taken Lantus when he has been sick but has not been taking Novolog because he wasn't eating.  Discussed the need for some Novolog when he's sick and the need to check CBG often (q 2-3 hours) when he is sick.  He verbalized understanding.  Also encouraged to stay hydrated when he is sick to avoid dehydration.  Encouraged to call MD as soon as he can when he becomes sick for orders and recommendations to avoid future DKA/hospital admission.  Ordered Living Well with Diabetes book.   Gentry Fitz, RN, BA, MHA, CDE Diabetes Coordinator Inpatient Diabetes Program  [phone] (Team Pager) [phone] [facility]) 10/12/2014 12:36 PM

## 2014-10-13 LAB — HEMOGLOBIN A1C
Hgb A1c MFr Bld: 13.4 % — ABNORMAL HIGH (ref 4.8–5.6)
Mean Plasma Glucose: 338 mg/dL

## 2014-10-13 LAB — GLUCOSE, CAPILLARY
Glucose-Capillary: 116 mg/dL — ABNORMAL HIGH (ref 70–99)
Glucose-Capillary: 322 mg/dL — ABNORMAL HIGH (ref 70–99)

## 2014-10-16 LAB — CULTURE, BLOOD (ROUTINE X 2)
Culture: NO GROWTH
Culture: NO GROWTH

## 2014-10-17 ENCOUNTER — Telehealth: Payer: Self-pay

## 2014-10-17 NOTE — Telephone Encounter (Signed)
Attempted to contact patient about lab results Number listed is a non working number Unable to leave message

## 2014-10-17 NOTE — Telephone Encounter (Signed)
-----   Message from Minerva Ends, MD sent at 10/16/2014  1:35 PM EST ----- Neg blood Cx

## 2014-10-25 ENCOUNTER — Ambulatory Visit: Payer: Medicaid Other | Attending: Internal Medicine | Admitting: Internal Medicine

## 2014-10-25 ENCOUNTER — Encounter: Payer: Self-pay | Admitting: Internal Medicine

## 2014-10-25 VITALS — BP 119/78 | HR 92 | Temp 98.0°F | Resp 16 | Wt 145.0 lb

## 2014-10-25 DIAGNOSIS — E1065 Type 1 diabetes mellitus with hyperglycemia: Secondary | ICD-10-CM | POA: Diagnosis present

## 2014-10-25 DIAGNOSIS — E139 Other specified diabetes mellitus without complications: Secondary | ICD-10-CM

## 2014-10-25 DIAGNOSIS — F17200 Nicotine dependence, unspecified, uncomplicated: Secondary | ICD-10-CM | POA: Insufficient documentation

## 2014-10-25 DIAGNOSIS — L309 Dermatitis, unspecified: Secondary | ICD-10-CM | POA: Diagnosis not present

## 2014-10-25 DIAGNOSIS — Z79899 Other long term (current) drug therapy: Secondary | ICD-10-CM | POA: Diagnosis not present

## 2014-10-25 DIAGNOSIS — L409 Psoriasis, unspecified: Secondary | ICD-10-CM | POA: Insufficient documentation

## 2014-10-25 DIAGNOSIS — R739 Hyperglycemia, unspecified: Secondary | ICD-10-CM

## 2014-10-25 DIAGNOSIS — Z794 Long term (current) use of insulin: Secondary | ICD-10-CM | POA: Diagnosis not present

## 2014-10-25 LAB — POCT URINALYSIS DIPSTICK
Bilirubin, UA: NEGATIVE
Bilirubin, UA: NEGATIVE
Glucose, UA: 500
Glucose, UA: 500
KETONES UA: 40
Ketones, UA: 40
LEUKOCYTES UA: NEGATIVE
Leukocytes, UA: NEGATIVE
Nitrite, UA: NEGATIVE
Nitrite, UA: NEGATIVE
Protein, UA: NEGATIVE
Protein, UA: NEGATIVE
Spec Grav, UA: 1.015
Urobilinogen, UA: 0.2
Urobilinogen, UA: 0.2
pH, UA: 5.5
pH, UA: 5

## 2014-10-25 LAB — GLUCOSE, POCT (MANUAL RESULT ENTRY)
POC Glucose: 448 mg/dl — AB (ref 70–99)
POC Glucose: 167 mg/dl — AB (ref 70–99)

## 2014-10-25 MED ORDER — INSULIN ASPART 100 UNIT/ML ~~LOC~~ SOLN
20.0000 [IU] | Freq: Once | SUBCUTANEOUS | Status: AC
  Administered 2014-10-25: 20 [IU] via SUBCUTANEOUS

## 2014-10-25 NOTE — Progress Notes (Signed)
Patient here for follow up from hospital Presents in office today with elevated blood sugar

## 2014-10-25 NOTE — Progress Notes (Signed)
MRN: [id number] Name: Jared Murray  Sex: male Age: 39 y.o. DOB: 06/13/1976  Allergies: Penicillins  Chief Complaint  Patient presents with  . Hospitalization Follow-up    HPI: Patient is 39 y.o. male who has history of type 1 diabetes, recently hospitalized with symptoms of abdominal pain nausea vomiting chest discomfort, EMR reviewed patient was found to be in DKA, was admitted in ICU had metabolic acidosis due to DKA, was treated with aggressive IV hydration and insulin drip subsequently switched Lantus and sliding scale insulin, his symptoms of gastroenteritis resolved, patient also had acute renal insufficiency likely secondary to dehydration which subsequently improved. Today he comes in the office his blood sugar is elevated as per patient he had some breakfast shake, denies any headache dizziness nausea vomiting blurry vision, has occasional urinary frequency denies any dysuria, today his urine shows glucosuria and ketones positive, patient is given IV fluids and insulin. As per patient he is taking Lantus 30 units at home as well as insulin sliding scale.  Past Medical History  Diagnosis Date  . Eczema   . Type 1 diabetes mellitus   . Psoriasis     Past Surgical History  Procedure Laterality Date  . Finger surgery        Medication List       This list is accurate as of: 10/25/14  1:03 PM.  Always use your most recent med list.               clobetasol cream 0.05 %  Commonly known as:  TEMOVATE  Apply 1 application topically 2 (two) times daily.     ibuprofen 600 MG tablet  Commonly known as:  ADVIL,MOTRIN  Take 1 tablet (600 mg total) by mouth every 8 (eight) hours as needed.     insulin glargine 100 UNIT/ML injection  Commonly known as:  LANTUS  Inject 0.3 mLs (30 Units total) into the skin daily.     Insulin Syringe-Needle U-100 29G X 1/2" 2 ML Misc  Commonly known as:  B-D INS SYRINGE 2CC/29GX1/2"  Check Blood sugar TID and QHS     NOVOLOG 100  UNIT/ML injection  Generic drug:  insulin aspart  Sliding scale CBG 70 - 120: 0 units CBG 121 - 150: 1 unit,  CBG 151 - 200: 2 units,  CBG 201 - 250: 3 units,  CBG 251 - 300: 5 units,  CBG 301 - 350: 7 units,  CBG 351 - 400: 9 units   CBG > 400: 10 units     pantoprazole 40 MG tablet  Commonly known as:  PROTONIX  Take 1 tablet (40 mg total) by mouth daily.     pantoprazole 40 MG tablet  Commonly known as:  PROTONIX  Take 1 tablet (40 mg total) by mouth daily.     promethazine 12.5 MG tablet  Commonly known as:  PHENERGAN  Take 1 tablet (12.5 mg total) by mouth every 6 (six) hours as needed for nausea or vomiting.     triamcinolone cream 0.1 %  Commonly known as:  KENALOG  Apply 1 application topically 2 (two) times daily.        Meds ordered this encounter  Medications  . insulin aspart (novoLOG) injection 20 Units    Sig:     Immunization History  Administered Date(s) Administered  . Pneumococcal Polysaccharide-23 03/18/2013, 01/26/2014    Family History  Problem Relation Age of Onset  . Hypertension Father   . Diabetes  multiple  . Lupus Cousin   . Stroke Maternal Grandmother   . Stroke Paternal Grandmother     History  Substance Use Topics  . Smoking status: Current Every Day Smoker  . Smokeless tobacco: Not on file  . Alcohol Use: Yes     Comment: recently quit - 7/14     Review of Systems   As noted in HPI  Filed Vitals:   10/25/14 0927  BP: 119/78  Pulse: 92  Temp: 98 F (36.7 C)  Resp: 16    Physical Exam  Physical Exam  Constitutional: No distress.  Eyes: EOM are normal. Pupils are equal, round, and reactive to light.  Neck: Neck supple.  Cardiovascular: Normal rate and regular rhythm.   Pulmonary/Chest: Breath sounds normal. No respiratory distress. He has no wheezes. He has no rales.  Abdominal: Soft. There is no tenderness. There is no rebound.  Musculoskeletal: He exhibits no edema.    CBC    Component Value Date/Time    WBC 2.9* 10/12/2014 0540   RBC 4.13* 10/12/2014 0540   HGB 13.3 10/12/2014 0540   HCT 37.5* 10/12/2014 0540   PLT 168 10/12/2014 0540   MCV 90.8 10/12/2014 0540   LYMPHSABS 0.7 10/10/2014 1103   MONOABS 0.2 10/10/2014 1103   EOSABS 0.0 10/10/2014 1103   BASOSABS 0.0 10/10/2014 1103    CMP     Component Value Date/Time   NA 136 10/12/2014 0540   K 3.2* 10/12/2014 0540   CL 104 10/12/2014 0540   CO2 24 10/12/2014 0540   GLUCOSE 102* 10/12/2014 0540   BUN 6 10/12/2014 0540   CREATININE 0.65 10/12/2014 0540   CALCIUM 8.7 10/12/2014 0540   PROT 8.9* 10/10/2014 1103   ALBUMIN 5.2 10/10/2014 1103   AST 48* 10/10/2014 1103   ALT 49 10/10/2014 1103   ALKPHOS 119* 10/10/2014 1103   BILITOT 1.7* 10/10/2014 1103   GFRNONAA >90 10/12/2014 0540   GFRAA >90 10/12/2014 0540    No results found for: CHOL  No components found for: HGA1C  Lab Results  Component Value Date/Time   AST 48* 10/10/2014 11:03 AM    Assessment and Plan  Other specified diabetes mellitus without complications - Plan:  Results for orders placed or performed in visit on 10/25/14  Glucose (CBG)  Result Value Ref Range   POC Glucose 448 (A) 70 - 99 mg/dl  Urinalysis Dipstick  Result Value Ref Range   Color, UA yellow    Clarity, UA clear    Glucose, UA 500    Bilirubin, UA neg    Ketones, UA 40    Spec Grav, UA <=1.005    Blood, UA trace    pH, UA 5.5    Protein, UA neg    Urobilinogen, UA 0.2    Nitrite, UA neg    Leukocytes, UA Negative   Glucose (CBG)  Result Value Ref Range   POC Glucose 167 (A) 70 - 99 mg/dl  Urinalysis Dipstick  Result Value Ref Range   Color, UA yellow    Clarity, UA clear    Glucose, UA 500    Bilirubin, UA neg    Ketones, UA 40    Spec Grav, UA 1.015    Blood, UA small    pH, UA 5.0    Protein, UA neg    Urobilinogen, UA 0.2    Nitrite, UA neg    Leukocytes, UA Negative    Patient has hyperglycemia with ketones positive, was given  IV fluids and insulin  subsequently his sugar level improved , insulin aspart (novoLOG) injection 20 Units, Glucose (CBG), Urinalysis Dipstick I have advised patient to increase Lantus to 35 units, continue with NovoLog sliding sliding scale Advised patient for diabetes meal planning, continue fingerstick glucose monitoring, patient will come back in 2 weeks per nurse visit CBG check/blood chemistry., Advise patient to get medical attention if he has any worsening of the symptoms. Patient understands verbalized instructions.   Health Maintenance  -Vaccinations: Up-to-date with her Pneumovax.  Return in about 3 months (around 01/25/2015) for diabetes, CBG check in 2 weeks/Nurse Visit.   This note has been created with Surveyor, quantity. Any transcriptional errors are unintentional.    Lorayne Marek, MD

## 2014-10-25 NOTE — Patient Instructions (Signed)
Diabetes Mellitus and Food It is important for you to manage your blood sugar (glucose) level. Your blood glucose level can be greatly affected by what you eat. Eating healthier foods in the appropriate amounts throughout the day at about the same time each day will help you control your blood glucose level. It can also help slow or prevent worsening of your diabetes mellitus. Healthy eating may even help you improve the level of your blood pressure and reach or maintain a healthy weight.  HOW CAN FOOD AFFECT ME? Carbohydrates Carbohydrates affect your blood glucose level more than any other type of food. Your dietitian will help you determine how many carbohydrates to eat at each meal and teach you how to count carbohydrates. Counting carbohydrates is important to keep your blood glucose at a healthy level, especially if you are using insulin or taking certain medicines for diabetes mellitus. Alcohol Alcohol can cause sudden decreases in blood glucose (hypoglycemia), especially if you use insulin or take certain medicines for diabetes mellitus. Hypoglycemia can be a life-threatening condition. Symptoms of hypoglycemia (sleepiness, dizziness, and disorientation) are similar to symptoms of having too much alcohol.  If your health care provider has given you approval to drink alcohol, do so in moderation and use the following guidelines:  Women should not have more than one drink per day, and men should not have more than two drinks per day. One drink is equal to:  12 oz of beer.  5 oz of wine.  1 oz of hard liquor.  Do not drink on an empty stomach.  Keep yourself hydrated. Have water, diet soda, or unsweetened iced tea.  Regular soda, juice, and other mixers might contain a lot of carbohydrates and should be counted. WHAT FOODS ARE NOT RECOMMENDED? As you make food choices, it is important to remember that all foods are not the same. Some foods have fewer nutrients per serving than other  foods, even though they might have the same number of calories or carbohydrates. It is difficult to get your body what it needs when you eat foods with fewer nutrients. Examples of foods that you should avoid that are high in calories and carbohydrates but low in nutrients include:  Trans fats (most processed foods list trans fats on the Nutrition Facts label).  Regular soda.  Juice.  Candy.  Sweets, such as cake, pie, doughnuts, and cookies.  Fried foods. WHAT FOODS CAN I EAT? Have nutrient-rich foods, which will nourish your body and keep you healthy. The food you should eat also will depend on several factors, including:  The calories you need.  The medicines you take.  Your weight.  Your blood glucose level.  Your blood pressure level.  Your cholesterol level. You also should eat a variety of foods, including:  Protein, such as meat, poultry, fish, tofu, nuts, and seeds (lean animal proteins are best).  Fruits.  Vegetables.  Dairy products, such as milk, cheese, and yogurt (low fat is best).  Breads, grains, pasta, cereal, rice, and beans.  Fats such as olive oil, trans fat-free margarine, canola oil, avocado, and olives. DOES EVERYONE WITH DIABETES MELLITUS HAVE THE SAME MEAL PLAN? Because every person with diabetes mellitus is different, there is not one meal plan that works for everyone. It is very important that you meet with a dietitian who will help you create a meal plan that is just right for you. Document Released: 05/01/2005 Document Revised: 08/09/2013 Document Reviewed: 07/01/2013 Exit[employer] Patient Information 2015 [employer]. This   information is not intended to replace advice given to you by your health care provider. Make sure you discuss any questions you have with your health care provider.  

## 2014-11-13 ENCOUNTER — Ambulatory Visit: Payer: Medicaid Other | Attending: Internal Medicine | Admitting: *Deleted

## 2014-11-13 VITALS — BP 116/74 | HR 83 | Temp 98.4°F | Resp 20

## 2014-11-13 DIAGNOSIS — R8290 Unspecified abnormal findings in urine: Secondary | ICD-10-CM | POA: Diagnosis not present

## 2014-11-13 DIAGNOSIS — E1065 Type 1 diabetes mellitus with hyperglycemia: Secondary | ICD-10-CM | POA: Diagnosis not present

## 2014-11-13 LAB — POCT URINALYSIS DIPSTICK
Blood, UA: NEGATIVE
GLUCOSE UA: 500
Glucose, UA: 1000
KETONES UA: 80
Ketones, UA: 40
Leukocytes, UA: NEGATIVE
Leukocytes, UA: NEGATIVE
Nitrite, UA: NEGATIVE
Nitrite, UA: POSITIVE
Protein, UA: 100
Protein, UA: 30
RBC UA: NEGATIVE
UROBILINOGEN UA: 1
Urobilinogen, UA: 1
pH, UA: 5.5
pH, UA: 5.5

## 2014-11-13 LAB — GLUCOSE, POCT (MANUAL RESULT ENTRY): POC Glucose: 194 mg/dl — AB (ref 70–99)

## 2014-11-13 MED ORDER — INV-SODIUM CHLORIDE 0.9% IV SOLN SWOG S1313
1000.0000 mL | Freq: Once | INTRAVENOUS | Status: AC
  Administered 2014-11-13: 1000 mL via INTRAVENOUS

## 2014-11-13 NOTE — Progress Notes (Signed)
Patient presents for CBG and record review, however, patient did not bring log or meter Med list reviewed; patient reports taking all meds as directed, except injecting 25 units lantus daily at noon. States when he injects 30-35 units his blood sugar drops to < 70 Patient reports AM fasting blood sugars ranging 90-120 Patient reports before lunch blood sugars ranging 180-300 Patient reports before dinner blood sugars ranging 200-230  CBG 214  BP 116/74 P 83 R 20 T  98.4 oral SpO2 98%  U/A Glucose 1000 Bili Moderate Ketone 40 Protein 100  Per PCP: 1000 mL IV NS Increase lantus to 27 units q HS for 4 days. If BS still > 150, increase lantus to 30 units q HS Patient states understanding.  CBG 194 after 1000 mL IV NS infused  Recheck U/A Glucose 500 Bili Large Ketone 80 Protein 30 Nitrite Positive These values were verified by second U/A Patient denies burning, pain, itching with urination; denies urinary frequency or urgency  Per PCP: Urine culture  Patient to return in 2 weeks for CBG and record review  Patient discharged to home in stable condition.

## 2014-11-14 LAB — CULTURE, URINE COMPREHENSIVE
Colony Count: NO GROWTH
ORGANISM ID, BACTERIA: NO GROWTH

## 2014-12-12 IMAGING — CR DG CHEST 1V PORT SAME DAY
1 series · 1 of 1 positions shown · non-contrast
Comparison: 03/16/2013

CLINICAL DATA: Shortness of breath, diabetic ketoacidosis, vomiting

EXAM:
PORTABLE CHEST - 1 VIEW SAME DAY

[AP]
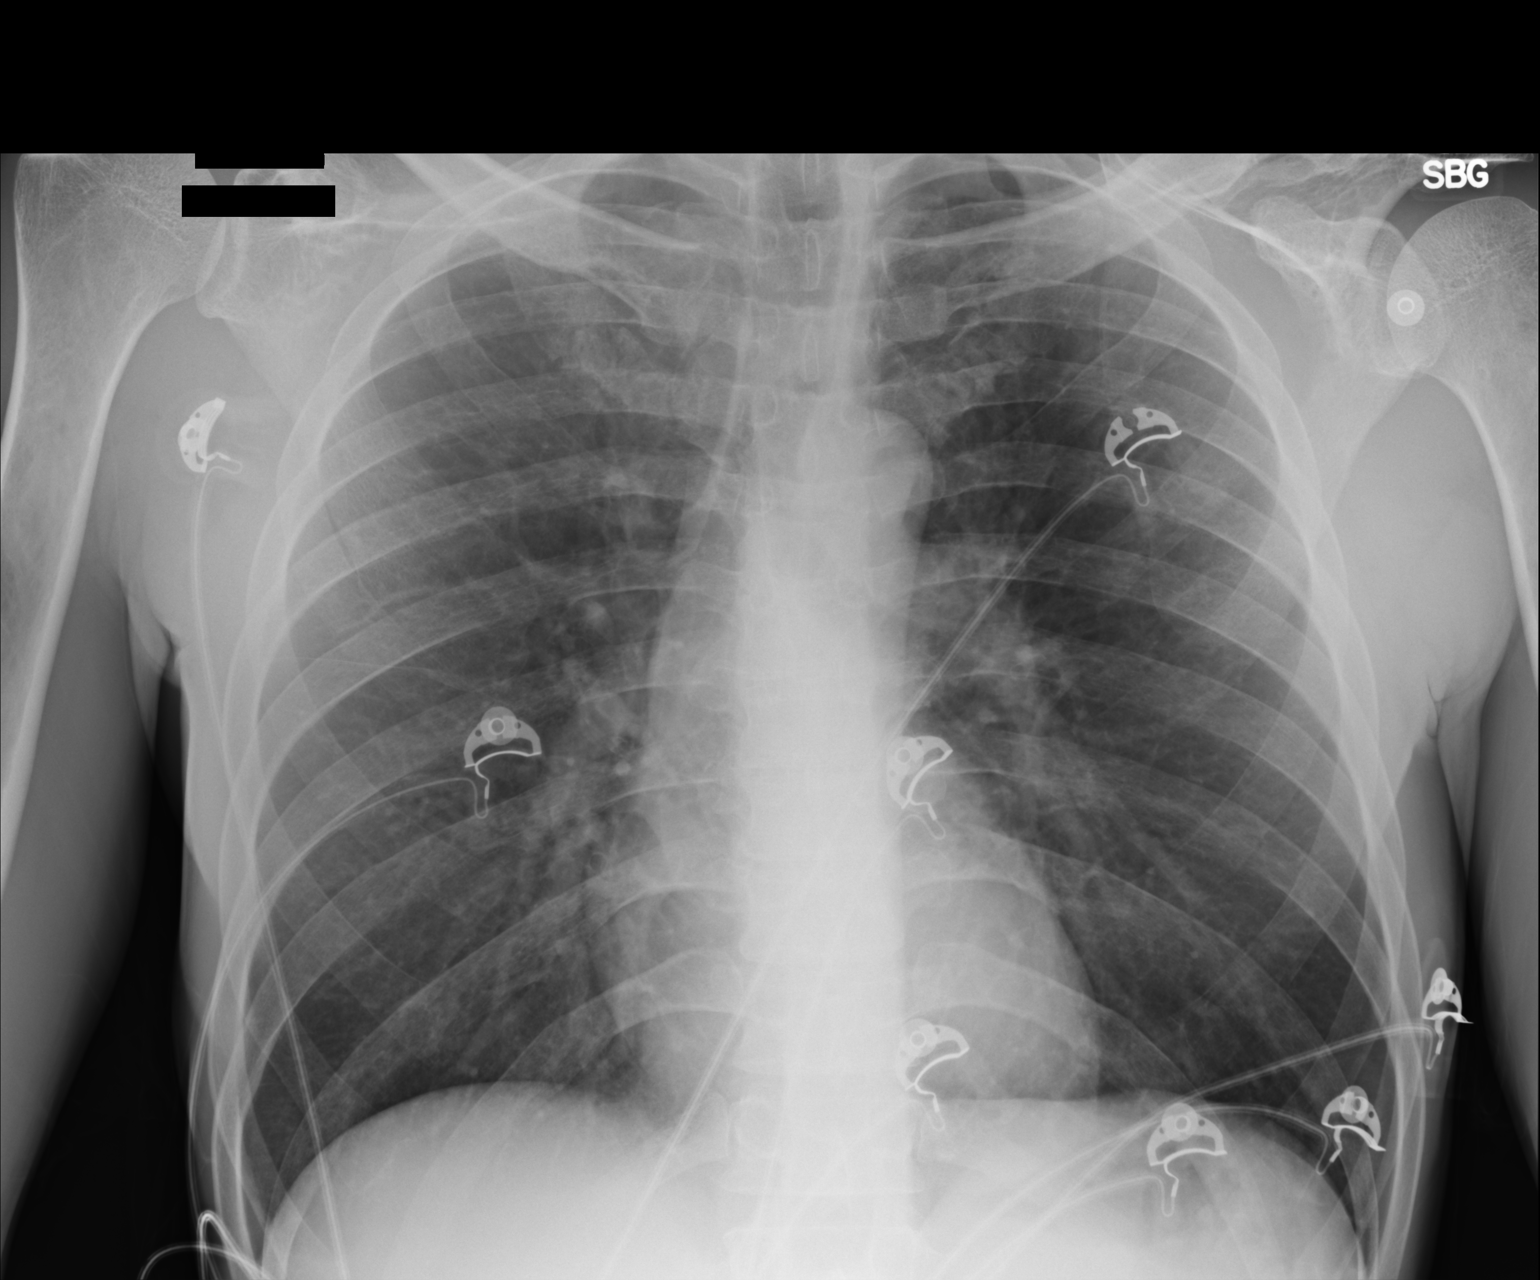

[1 of 1 positions shown; findings below may reference images not displayed]

FINDINGS: The heart size and mediastinal contours are within normal limits.
Both lungs are clear. The visualized skeletal structures are
unremarkable.
IMPRESSION: No active disease.

## 2015-01-22 ENCOUNTER — Encounter (HOSPITAL_COMMUNITY): Payer: Self-pay | Admitting: Emergency Medicine

## 2015-01-22 ENCOUNTER — Inpatient Hospital Stay (HOSPITAL_COMMUNITY): Payer: Medicaid Other

## 2015-01-22 ENCOUNTER — Emergency Department (HOSPITAL_COMMUNITY): Payer: Medicaid Other

## 2015-01-22 ENCOUNTER — Inpatient Hospital Stay (HOSPITAL_COMMUNITY)
Admission: EM | Admit: 2015-01-22 | Discharge: 2015-01-24 | DRG: 637 | Disposition: A | Discharge status: Home / Self Care | Payer: Medicaid Other | Attending: Internal Medicine | Admitting: Internal Medicine

## 2015-01-22 DIAGNOSIS — R112 Nausea with vomiting, unspecified: Secondary | ICD-10-CM | POA: Diagnosis present

## 2015-01-22 DIAGNOSIS — E118 Type 2 diabetes mellitus with unspecified complications: Secondary | ICD-10-CM

## 2015-01-22 DIAGNOSIS — E875 Hyperkalemia: Secondary | ICD-10-CM | POA: Insufficient documentation

## 2015-01-22 DIAGNOSIS — K219 Gastro-esophageal reflux disease without esophagitis: Secondary | ICD-10-CM | POA: Diagnosis present

## 2015-01-22 DIAGNOSIS — F17210 Nicotine dependence, cigarettes, uncomplicated: Secondary | ICD-10-CM | POA: Diagnosis present

## 2015-01-22 DIAGNOSIS — N179 Acute kidney failure, unspecified: Secondary | ICD-10-CM | POA: Diagnosis present

## 2015-01-22 DIAGNOSIS — Z880 Allergy status to penicillin: Secondary | ICD-10-CM

## 2015-01-22 DIAGNOSIS — R6511 Systemic inflammatory response syndrome (SIRS) of non-infectious origin with acute organ dysfunction: Secondary | ICD-10-CM | POA: Diagnosis present

## 2015-01-22 DIAGNOSIS — L409 Psoriasis, unspecified: Secondary | ICD-10-CM | POA: Diagnosis present

## 2015-01-22 DIAGNOSIS — E860 Dehydration: Secondary | ICD-10-CM | POA: Diagnosis present

## 2015-01-22 DIAGNOSIS — Z794 Long term (current) use of insulin: Secondary | ICD-10-CM

## 2015-01-22 DIAGNOSIS — F1290 Cannabis use, unspecified, uncomplicated: Secondary | ICD-10-CM | POA: Diagnosis present

## 2015-01-22 DIAGNOSIS — K210 Gastro-esophageal reflux disease with esophagitis: Secondary | ICD-10-CM

## 2015-01-22 DIAGNOSIS — R739 Hyperglycemia, unspecified: Secondary | ICD-10-CM

## 2015-01-22 DIAGNOSIS — A419 Sepsis, unspecified organism: Secondary | ICD-10-CM

## 2015-01-22 DIAGNOSIS — E0810 Diabetes mellitus due to underlying condition with ketoacidosis without coma: Secondary | ICD-10-CM | POA: Diagnosis not present

## 2015-01-22 DIAGNOSIS — E1010 Type 1 diabetes mellitus with ketoacidosis without coma: Principal | ICD-10-CM | POA: Diagnosis present

## 2015-01-22 DIAGNOSIS — E1310 Other specified diabetes mellitus with ketoacidosis without coma: Secondary | ICD-10-CM | POA: Diagnosis not present

## 2015-01-22 DIAGNOSIS — IMO0002 Reserved for concepts with insufficient information to code with codable children: Secondary | ICD-10-CM | POA: Diagnosis present

## 2015-01-22 DIAGNOSIS — E1110 Type 2 diabetes mellitus with ketoacidosis without coma: Secondary | ICD-10-CM | POA: Diagnosis present

## 2015-01-22 DIAGNOSIS — E1165 Type 2 diabetes mellitus with hyperglycemia: Secondary | ICD-10-CM | POA: Diagnosis present

## 2015-01-22 HISTORY — DX: Gastro-esophageal reflux disease without esophagitis: K21.9

## 2015-01-22 HISTORY — DX: Sepsis, unspecified organism: A41.9

## 2015-01-22 HISTORY — DX: Acute kidney failure, unspecified: N17.9

## 2015-01-22 LAB — URINALYSIS, ROUTINE W REFLEX MICROSCOPIC
BILIRUBIN URINE: NEGATIVE
Glucose, UA: >1000 mg/dL — AB
Ketones, ur: >80 mg/dL — AB
LEUKOCYTES UA: NEGATIVE
Nitrite: NEGATIVE
PH: 5 (ref 5.0–8.0)
Protein, ur: 30 mg/dL — AB
Specific Gravity, Urine: 1.022 (ref 1.005–1.030)
Urobilinogen, UA: 0.2 mg/dL (ref 0.0–1.0)

## 2015-01-22 LAB — BASIC METABOLIC PANEL
ANION GAP: 10 (ref 5–15)
ANION GAP: 8 (ref 5–15)
Anion gap: 30 — ABNORMAL HIGH (ref 5–15)
Anion gap: 21 — ABNORMAL HIGH (ref 5–15)
Anion gap: 13 (ref 5–15)
BUN: 20 mg/dL (ref 6–20)
BUN: 17 mg/dL (ref 6–20)
BUN: 14 mg/dL (ref 6–20)
BUN: 12 mg/dL (ref 6–20)
BUN: 12 mg/dL (ref 6–20)
CALCIUM: 8 mg/dL — AB (ref 8.9–10.3)
CO2: 8 mmol/L — AB (ref 22–32)
CO2: 8 mmol/L — ABNORMAL LOW (ref 22–32)
CO2: 14 mmol/L — ABNORMAL LOW (ref 22–32)
CO2: 18 mmol/L — AB (ref 22–32)
CO2: 19 mmol/L — AB (ref 22–32)
Calcium: 8.4 mg/dL — ABNORMAL LOW (ref 8.9–10.3)
Calcium: 7.8 mg/dL — ABNORMAL LOW (ref 8.9–10.3)
Calcium: 8.1 mg/dL — ABNORMAL LOW (ref 8.9–10.3)
Calcium: 7.8 mg/dL — ABNORMAL LOW (ref 8.9–10.3)
Chloride: 103 mmol/L (ref 101–111)
Chloride: 114 mmol/L — ABNORMAL HIGH (ref 101–111)
Chloride: 115 mmol/L — ABNORMAL HIGH (ref 101–111)
Chloride: 115 mmol/L — ABNORMAL HIGH (ref 101–111)
Chloride: 114 mmol/L — ABNORMAL HIGH (ref 101–111)
Creatinine, Ser: 2.42 mg/dL — ABNORMAL HIGH (ref 0.61–1.24)
Creatinine, Ser: 1.94 mg/dL — ABNORMAL HIGH (ref 0.61–1.24)
Creatinine, Ser: 1.43 mg/dL — ABNORMAL HIGH (ref 0.61–1.24)
Creatinine, Ser: 0.9 mg/dL (ref 0.61–1.24)
Creatinine, Ser: 0.9 mg/dL (ref 0.61–1.24)
GFR calc Af Amer: >60 mL/min (ref >60)
GFR calc Af Amer: >60 mL/min (ref >60)
GFR calc non Af Amer: 32 mL/min — ABNORMAL LOW (ref >60)
GFR calc non Af Amer: 42 mL/min — ABNORMAL LOW (ref >60)
GFR calc non Af Amer: >60 mL/min (ref >60)
GFR calc non Af Amer: >60 mL/min (ref >60)
GFR, EST AFRICAN AMERICAN: 37 mL/min — AB (ref >60)
GFR, EST AFRICAN AMERICAN: 49 mL/min — AB (ref >60)
GLUCOSE: 588 mg/dL — AB (ref 65–99)
GLUCOSE: 351 mg/dL — AB (ref 65–99)
GLUCOSE: 229 mg/dL — AB (ref 65–99)
GLUCOSE: 75 mg/dL (ref 65–99)
Glucose, Bld: 102 mg/dL — ABNORMAL HIGH (ref 65–99)
POTASSIUM: 6 mmol/L — AB (ref 3.5–5.1)
POTASSIUM: 4.7 mmol/L (ref 3.5–5.1)
POTASSIUM: 4.1 mmol/L (ref 3.5–5.1)
POTASSIUM: 3.7 mmol/L (ref 3.5–5.1)
Potassium: 4.1 mmol/L (ref 3.5–5.1)
SODIUM: 142 mmol/L (ref 135–145)
SODIUM: 141 mmol/L (ref 135–145)
Sodium: 141 mmol/L (ref 135–145)
Sodium: 143 mmol/L (ref 135–145)
Sodium: 143 mmol/L (ref 135–145)

## 2015-01-22 LAB — PROCALCITONIN: PROCALCITONIN: 1.36 ng/mL

## 2015-01-22 LAB — CBC WITH DIFFERENTIAL/PLATELET
BASOS PCT: 0 % (ref 0–1)
Basophils Absolute: 0 10*3/uL (ref 0.0–0.1)
EOS PCT: 0 % (ref 0–5)
Eosinophils Absolute: 0 10*3/uL (ref 0.0–0.7)
HCT: 44 % (ref 39.0–52.0)
Hemoglobin: 14.7 g/dL (ref 13.0–17.0)
Lymphocytes Relative: 4 % — ABNORMAL LOW (ref 12–46)
Lymphs Abs: 0.9 10*3/uL (ref 0.7–4.0)
MCH: 33 pg (ref 26.0–34.0)
MCHC: 33.4 g/dL (ref 30.0–36.0)
MCV: 98.9 fL (ref 78.0–100.0)
MONO ABS: 1.1 10*3/uL — AB (ref 0.1–1.0)
Monocytes Relative: 5 % (ref 3–12)
Neutro Abs: 21.9 10*3/uL — ABNORMAL HIGH (ref 1.7–7.7)
Neutrophils Relative %: 91 % — ABNORMAL HIGH (ref 43–77)
Platelets: 393 10*3/uL (ref 150–400)
RBC: 4.45 MIL/uL (ref 4.22–5.81)
RDW: 12.5 % (ref 11.5–15.5)
WBC: 23.9 10*3/uL — ABNORMAL HIGH (ref 4.0–10.5)

## 2015-01-22 LAB — COMPREHENSIVE METABOLIC PANEL
ALT: 26 U/L (ref 17–63)
AST: 43 U/L — AB (ref 15–41)
Albumin: 4.4 g/dL (ref 3.5–5.0)
Alkaline Phosphatase: 169 U/L — ABNORMAL HIGH (ref 38–126)
Anion gap: 37 — ABNORMAL HIGH (ref 5–15)
BUN: 23 mg/dL — ABNORMAL HIGH (ref 6–20)
CO2: 8 mmol/L — AB (ref 22–32)
Calcium: 8.7 mg/dL — ABNORMAL LOW (ref 8.9–10.3)
Chloride: 90 mmol/L — ABNORMAL LOW (ref 101–111)
Creatinine, Ser: 2.98 mg/dL — ABNORMAL HIGH (ref 0.61–1.24)
GFR calc Af Amer: 29 mL/min — ABNORMAL LOW (ref >60)
GFR, EST NON AFRICAN AMERICAN: 25 mL/min — AB (ref >60)
GLUCOSE: 888 mg/dL — AB (ref 65–99)
Potassium: 7 mmol/L (ref 3.5–5.1)
SODIUM: 135 mmol/L (ref 135–145)
TOTAL PROTEIN: 7.9 g/dL (ref 6.5–8.1)
Total Bilirubin: 2.1 mg/dL — ABNORMAL HIGH (ref 0.3–1.2)

## 2015-01-22 LAB — LACTIC ACID, PLASMA: Lactic Acid, Venous: 2.3 mmol/L (ref 0.5–2.0)

## 2015-01-22 LAB — GLUCOSE, CAPILLARY
Glucose-Capillary: 78 mg/dL (ref 65–99)
Glucose-Capillary: 79 mg/dL (ref 65–99)
Glucose-Capillary: 78 mg/dL (ref 65–99)
Glucose-Capillary: 85 mg/dL (ref 65–99)
Glucose-Capillary: 70 mg/dL (ref 65–99)
Glucose-Capillary: 109 mg/dL — ABNORMAL HIGH (ref 65–99)

## 2015-01-22 LAB — RAPID URINE DRUG SCREEN, HOSP PERFORMED
Amphetamines: NOT DETECTED
BENZODIAZEPINES: NOT DETECTED
Barbiturates: NOT DETECTED
COCAINE: NOT DETECTED
OPIATES: NOT DETECTED
TETRAHYDROCANNABINOL: NOT DETECTED

## 2015-01-22 LAB — I-STAT CG4 LACTIC ACID, ED: Lactic Acid, Venous: 5.4 mmol/L (ref 0.5–2.0)

## 2015-01-22 LAB — URINE MICROSCOPIC-ADD ON

## 2015-01-22 LAB — CBG MONITORING, ED
GLUCOSE-CAPILLARY: 391 mg/dL — AB (ref 65–99)
GLUCOSE-CAPILLARY: 314 mg/dL — AB (ref 65–99)
GLUCOSE-CAPILLARY: 208 mg/dL — AB (ref 65–99)
GLUCOSE-CAPILLARY: 204 mg/dL — AB (ref 65–99)
Glucose-Capillary: >600 mg/dL (ref 65–99)
Glucose-Capillary: 503 mg/dL — ABNORMAL HIGH (ref 65–99)
Glucose-Capillary: 426 mg/dL — ABNORMAL HIGH (ref 65–99)
Glucose-Capillary: 271 mg/dL — ABNORMAL HIGH (ref 65–99)
Glucose-Capillary: 199 mg/dL — ABNORMAL HIGH (ref 65–99)
Glucose-Capillary: 173 mg/dL — ABNORMAL HIGH (ref 65–99)
Glucose-Capillary: 127 mg/dL — ABNORMAL HIGH (ref 65–99)

## 2015-01-22 LAB — ETHANOL: Alcohol, Ethyl (B): <5 mg/dL (ref <5)

## 2015-01-22 LAB — PROTIME-INR
INR: 1.19 (ref 0.00–1.49)
Prothrombin Time: 15.2 seconds (ref 11.6–15.2)

## 2015-01-22 LAB — SODIUM, URINE, RANDOM: Sodium, Ur: 53 mmol/L

## 2015-01-22 LAB — CREATININE, URINE, RANDOM: CREATININE, URINE: 34.24 mg/dL

## 2015-01-22 LAB — I-STAT TROPONIN, ED: Troponin i, poc: 0 ng/mL (ref 0.00–0.08)

## 2015-01-22 LAB — MRSA PCR SCREENING: MRSA BY PCR: NEGATIVE

## 2015-01-22 LAB — LIPASE, BLOOD: Lipase: 345 U/L — ABNORMAL HIGH (ref 22–51)

## 2015-01-22 LAB — APTT: aPTT: 25 seconds (ref 24–37)

## 2015-01-22 MED ORDER — PANTOPRAZOLE SODIUM 40 MG PO TBEC
40.0000 mg | DELAYED_RELEASE_TABLET | Freq: Every day | ORAL | Status: DC
  Administered 2015-01-22 – 2015-01-24 (×3): 40 mg via ORAL
  Filled 2015-01-22 (×3): qty 1

## 2015-01-22 MED ORDER — DEXTROSE 50 % IV SOLN
12.0000 mL | Freq: Once | INTRAVENOUS | Status: AC
  Filled 2015-01-22: qty 50

## 2015-01-22 MED ORDER — VANCOMYCIN HCL 10 G IV SOLR: 1500.0000 mg | Freq: Two times a day (BID) | INTRAVENOUS | Status: DC

## 2015-01-22 MED ORDER — ONDANSETRON HCL 4 MG/2ML IJ SOLN
4.0000 mg | Freq: Once | INTRAMUSCULAR | Status: AC
Start: 2015-01-22 — End: 2015-01-22
  Administered 2015-01-22: 4 mg via INTRAVENOUS
  Filled 2015-01-22: qty 2

## 2015-01-22 MED ORDER — SODIUM CHLORIDE 0.9 % IV SOLN
2.0000 g | Freq: Once | INTRAVENOUS | Status: AC
  Administered 2015-01-22: 2 g via INTRAVENOUS
  Filled 2015-01-22: qty 20

## 2015-01-22 MED ORDER — POTASSIUM CHLORIDE 10 MEQ/100ML IV SOLN
10.0000 meq | INTRAVENOUS | Status: AC
  Administered 2015-01-22 (×2): 10 meq via INTRAVENOUS
  Filled 2015-01-22 (×2): qty 100

## 2015-01-22 MED ORDER — NICOTINE 21 MG/24HR TD PT24
21.0000 mg | MEDICATED_PATCH | Freq: Every day | TRANSDERMAL | Status: DC
  Filled 2015-01-22 (×3): qty 1

## 2015-01-22 MED ORDER — SODIUM CHLORIDE 0.9 % IV SOLN: INTRAVENOUS | Status: DC

## 2015-01-22 MED ORDER — SODIUM CHLORIDE 0.9 % IV BOLUS (SEPSIS)
1000.0000 mL | Freq: Once | INTRAVENOUS | Status: AC
Start: 2015-01-22 — End: 2015-01-22
  Administered 2015-01-22: 1000 mL via INTRAVENOUS

## 2015-01-22 MED ORDER — SODIUM CHLORIDE 0.9 % IV BOLUS (SEPSIS)
1000.0000 mL | Freq: Once | INTRAVENOUS | Status: AC
  Administered 2015-01-22: 1000 mL via INTRAVENOUS

## 2015-01-22 MED ORDER — HYDROXYZINE HCL 50 MG/ML IM SOLN
25.0000 mg | Freq: Four times a day (QID) | INTRAMUSCULAR | Status: DC | PRN
  Filled 2015-01-22: qty 0.5

## 2015-01-22 MED ORDER — SODIUM CHLORIDE 0.9 % IV BOLUS (SEPSIS)
2000.0000 mL | Freq: Once | INTRAVENOUS | Status: AC
  Administered 2015-01-22: 2000 mL via INTRAVENOUS

## 2015-01-22 MED ORDER — TRIAMCINOLONE ACETONIDE 0.1 % EX CREA
1.0000 "application " | TOPICAL_CREAM | Freq: Two times a day (BID) | CUTANEOUS | Status: DC
  Administered 2015-01-22 – 2015-01-24 (×3): 1 via TOPICAL
  Filled 2015-01-22: qty 15

## 2015-01-22 MED ORDER — HEPARIN SODIUM (PORCINE) 5000 UNIT/ML IJ SOLN
5000.0000 [IU] | Freq: Three times a day (TID) | INTRAMUSCULAR | Status: DC
  Administered 2015-01-22 – 2015-01-24 (×6): 5000 [IU] via SUBCUTANEOUS
  Filled 2015-01-22 (×8): qty 1

## 2015-01-22 MED ORDER — SODIUM CHLORIDE 0.9 % IV SOLN
INTRAVENOUS | Status: AC
  Administered 2015-01-22: 05:00:00 via INTRAVENOUS

## 2015-01-22 MED ORDER — DEXTROSE 5 % IV SOLN
1.0000 g | Freq: Three times a day (TID) | INTRAVENOUS | Status: DC
  Administered 2015-01-22 – 2015-01-23 (×3): 1 g via INTRAVENOUS
  Filled 2015-01-22 (×6): qty 1

## 2015-01-22 MED ORDER — INSULIN ASPART 100 UNIT/ML IV SOLN
10.0000 [IU] | Freq: Once | INTRAVENOUS | Status: AC
  Administered 2015-01-22: 10 [IU] via INTRAVENOUS
  Filled 2015-01-22: qty 1

## 2015-01-22 MED ORDER — CLOBETASOL PROPIONATE 0.05 % EX CREA
1.0000 "application " | TOPICAL_CREAM | Freq: Two times a day (BID) | CUTANEOUS | Status: DC
  Administered 2015-01-22 – 2015-01-24 (×3): 1 via TOPICAL
  Filled 2015-01-22: qty 15

## 2015-01-22 MED ORDER — SODIUM CHLORIDE 0.9 % IV SOLN
INTRAVENOUS | Status: DC
  Administered 2015-01-22: 05:00:00 via INTRAVENOUS

## 2015-01-22 MED ORDER — DEXTROSE-NACL 5-0.45 % IV SOLN
INTRAVENOUS | Status: DC
  Administered 2015-01-22: 125 mL/h via INTRAVENOUS
  Administered 2015-01-22: 10:00:00 via INTRAVENOUS

## 2015-01-22 MED ORDER — VANCOMYCIN HCL IN DEXTROSE 1-5 GM/200ML-% IV SOLN
1000.0000 mg | INTRAVENOUS | Status: DC
  Administered 2015-01-23: 1000 mg via INTRAVENOUS
  Filled 2015-01-22: qty 200

## 2015-01-22 MED ORDER — DEXTROSE 50 % IV SOLN
INTRAVENOUS | Status: AC
  Administered 2015-01-22: 12 mL
  Filled 2015-01-22: qty 50

## 2015-01-22 MED ORDER — DEXTROSE 10 % IV SOLN
INTRAVENOUS | Status: DC
  Administered 2015-01-22: 75 mL/h via INTRAVENOUS

## 2015-01-22 MED ORDER — DEXTROSE 5 % IV SOLN
1.0000 g | Freq: Once | INTRAVENOUS | Status: AC
  Administered 2015-01-22: 1 g via INTRAVENOUS
  Filled 2015-01-22: qty 1

## 2015-01-22 MED ORDER — VANCOMYCIN HCL 10 G IV SOLR
1500.0000 mg | Freq: Once | INTRAVENOUS | Status: AC
  Administered 2015-01-22: 1500 mg via INTRAVENOUS
  Filled 2015-01-22: qty 1500

## 2015-01-22 MED ORDER — SODIUM CHLORIDE 0.9 % IV SOLN
INTRAVENOUS | Status: DC
  Administered 2015-01-22: 4.4 [IU]/h via INTRAVENOUS
  Administered 2015-01-23: 1.8 [IU]/h via INTRAVENOUS
  Filled 2015-01-22 (×2): qty 2.5

## 2015-01-22 NOTE — ED Notes (Signed)
Dr Claudine Mouton given a copy of lactic acid and chem 8 results

## 2015-01-22 NOTE — ED Notes (Signed)
Checked patient blood sugar it was 208 notified RN of blood sugar

## 2015-01-22 NOTE — ED Notes (Signed)
Checked patient blood sugar it was 199 notified RN of blood sugar

## 2015-01-22 NOTE — ED Provider Notes (Signed)
CSN: [id number]     Arrival date & time 01/22/15  0258 History   First MD Initiated Contact with Patient 01/22/15 0300     This chart was scribed for Everlene Balls, MD by Forrestine Him, ED Scribe. This patient was seen in room A08C/A08C and the patient's care was started 3:04 AM.   Chief Complaint  Patient presents with  . Hyperglycemia   The history is provided by the patient and the EMS personnel. No language interpreter was used.    HPI Comments: BON DOWIS brought in by EMS is a 39 y.o. male with a PMHx of type 1 diabetes who presents to the Emergency Department complaining of constant, ongoing, unchanged hyperglycemia this evening. Per EMS, pts blood sugar read "high" en route to department. Currently pt reports diaphoresis, nausea, and vomiting. No recent fever or chills. Mr. Cueto skipped his insulin dosage this morning. However, 30 units of Lantus taken yesterday as normal. Pt denies any history of DKA after initial diabetes diagnosis. No chest pain or abdominal pain at this time. Pt with known allergy to Penicillins.  Past Medical History  Diagnosis Date  . Eczema   . Type 1 diabetes mellitus   . Psoriasis    Past Surgical History  Procedure Laterality Date  . Finger surgery     Family History  Problem Relation Age of Onset  . Hypertension Father   . Diabetes      multiple  . Lupus Cousin   . Stroke Maternal Grandmother   . Stroke Paternal Grandmother    History  Substance Use Topics  . Smoking status: Current Every Day Smoker  . Smokeless tobacco: Not on file     Comment: Smoking 3 cigs /day  . Alcohol Use: 0.0 oz/week    0 Standard drinks or equivalent per week     Comment: recently quit - 7/14     Review of Systems  Constitutional: Positive for diaphoresis. Negative for fever and chills.  HENT: Negative for congestion.   Respiratory: Negative for cough and shortness of breath.   Cardiovascular: Negative for chest pain.  Gastrointestinal: Positive for  vomiting and diarrhea.  Skin: Negative for rash.  Psychiatric/Behavioral: Negative for confusion.  All other systems reviewed and are negative.     Allergies  Penicillins  Home Medications   Prior to Admission medications   Medication Sig Start Date End Date Taking? Authorizing Provider  clobetasol cream (TEMOVATE) 3.24 % Apply 1 application topically 2 (two) times daily. 08/04/14   Lorayne Marek, MD  ibuprofen (ADVIL,MOTRIN) 600 MG tablet Take 1 tablet (600 mg total) by mouth every 8 (eight) hours as needed. Patient not taking: Reported on 08/04/2014 05/10/14   Lorayne Marek, MD  insulin glargine (LANTUS) 100 UNIT/ML injection Inject 0.3 mLs (30 Units total) into the skin daily. 10/12/14   Ripudeep Krystal Eaton, MD  Insulin Syringe-Needle U-100 (B-D INS SYRINGE 2CC/29GX1/2") 29G X 1/2" 2 ML MISC Check Blood sugar TID and QHS 03/22/14   Olugbemiga E Jegede, MD  NOVOLOG 100 UNIT/ML injection Sliding scale CBG 70 - 120: 0 units CBG 121 - 150: 1 unit,  CBG 151 - 200: 2 units,  CBG 201 - 250: 3 units,  CBG 251 - 300: 5 units,  CBG 301 - 350: 7 units,  CBG 351 - 400: 9 units   CBG > 400: 10 units 10/12/14   Ripudeep K Rai, MD  pantoprazole (PROTONIX) 40 MG tablet Take 1 tablet (40 mg total) by mouth daily.  Patient not taking: Reported on 11/13/2014 07/03/14   Silver Huguenin Elgergawy, MD  pantoprazole (PROTONIX) 40 MG tablet Take 1 tablet (40 mg total) by mouth daily. 10/12/14   Ripudeep Krystal Eaton, MD  promethazine (PHENERGAN) 12.5 MG tablet Take 1 tablet (12.5 mg total) by mouth every 6 (six) hours as needed for nausea or vomiting. 10/12/14   Ripudeep Krystal Eaton, MD  triamcinolone cream (KENALOG) 0.1 % Apply 1 application topically 2 (two) times daily. 08/04/14   Lorayne Marek, MD   Triage Vitals: BP 127/82 mmHg  Pulse 130  Temp(Src) 97.6 F (36.4 C) (Oral)  Resp 25  Ht 5\' 8"  (1.727 m)  Wt 144 lb (65.318 kg)  BMI 21.90 kg/m2  SpO2 100%   Physical Exam  Constitutional: He is oriented to person, place, and time.  Vital signs are normal. He appears well-developed and well-nourished.  Non-toxic appearance. He does not appear ill. He appears distressed.  HENT:  Head: Normocephalic and atraumatic.  Nose: Nose normal.  Mouth/Throat: Oropharynx is clear and moist. No oropharyngeal exudate.  Dry mucous membranes  Eyes: Conjunctivae and EOM are normal. Pupils are equal, round, and reactive to light. No scleral icterus.  Neck: Normal range of motion. Neck supple. No tracheal deviation, no edema, no erythema and normal range of motion present. No thyroid mass and no thyromegaly present.  Cardiovascular: Regular rhythm, S1 normal, S2 normal, normal heart sounds, intact distal pulses and normal pulses.  Tachycardia present.  Exam reveals no gallop and no friction rub.   No murmur heard. Pulses:      Radial pulses are 2+ on the right side, and 2+ on the left side.       Dorsalis pedis pulses are 2+ on the right side, and 2+ on the left side.  Pulmonary/Chest: Effort normal and breath sounds normal. No respiratory distress. He has no wheezes. He has no rhonchi. He has no rales.  Kussmaul respirations   Abdominal: Soft. Normal appearance and bowel sounds are normal. He exhibits no distension, no ascites and no mass. There is no hepatosplenomegaly. There is no tenderness. There is no rebound, no guarding and no CVA tenderness.  Musculoskeletal: Normal range of motion. He exhibits no edema or tenderness.  Lymphadenopathy:    He has no cervical adenopathy.  Neurological: He is alert and oriented to person, place, and time. He has normal strength. No cranial nerve deficit or sensory deficit.  Skin: Skin is warm, dry and intact. No petechiae and no rash noted. He is not diaphoretic. No erythema. No pallor.  Psychiatric: He has a normal mood and affect. His behavior is normal. Judgment normal.  Nursing note and vitals reviewed.   ED Course  Procedures (including critical care time)  DIAGNOSTIC STUDIES: Oxygen  Saturation is 100% on RA, Normal by my interpretation.    COORDINATION OF CARE: 3:05 AM- Will give fluids. Will order CXR, i-stat chem 8, i-stat troponin, CBG, CBC, urinalysis, ethanol, and urine rapid drug screen. Discussed treatment plan with pt at bedside and pt agreed to plan.       Labs Review Labs Reviewed  CBC WITH DIFFERENTIAL/PLATELET - Abnormal; Notable for the following:    WBC 23.9 (*)    Neutrophils Relative % 91 (*)    Neutro Abs 21.9 (*)    Lymphocytes Relative 4 (*)    Monocytes Absolute 1.1 (*)    All other components within normal limits  I-STAT CG4 LACTIC ACID, ED - Abnormal; Notable for the following:  Lactic Acid, Venous 5.40 (*)    All other components within normal limits  CBG MONITORING, ED - Abnormal; Notable for the following:    Glucose-Capillary >600 (*)    All other components within normal limits  URINALYSIS, ROUTINE W REFLEX MICROSCOPIC (NOT AT [facility])  URINE RAPID DRUG SCREEN (HOSP PERFORMED) NOT AT [facility]  ETHANOL  I-STAT CHEM 8, ED  I-STAT TROPOININ, ED    Imaging Review No results found.   EKG Interpretation   Date/Time:  Monday January 22 2015 03:03:37 EDT Ventricular Rate:  128 PR Interval:  108 QRS Duration: 140 QT Interval:  444 QTC Calculation: 648 R Axis:   95 Text Interpretation:  Sinus tachycardia Probable left atrial enlargement  Right bundle branch block Hyperkalemia vs de winter t waves present  Confirmed by Glynn Octave [phone]) on 01/22/2015 3:12:37 AM      MDM   Final diagnoses:  Hyperglycemia   Patient presents in critical condition from hyperglycemia and hyperkalemia.  He was treated aggressively with IVF, insulin and broad spectrum antibiotics for possible infection and WBC over 20.  He continues to improve and appear more awake in the ED.  He will require admission for DKA.  CRITICAL CARE Performed by: Everlene Balls   Total critical care time: 59min DKA  Critical care time was exclusive of separately  billable procedures and treating other patients.  Critical care was necessary to treat or prevent imminent or life-threatening deterioration.  Critical care was time spent personally by me on the following activities: development of treatment plan with patient and/or surrogate as well as nursing, discussions with consultants, evaluation of patient's response to treatment, examination of patient, obtaining history from patient or surrogate, ordering and performing treatments and interventions, ordering and review of laboratory studies, ordering and review of radiographic studies, pulse oximetry and re-evaluation of patient's condition.   I personally performed the services described in this documentation, which was scribed in my presence. The recorded information has been reviewed and is accurate.   Everlene Balls, MD 01/29/15 1529

## 2015-01-22 NOTE — ED Notes (Signed)
Critical Lactic 2.3 per Oscar La

## 2015-01-22 NOTE — Progress Notes (Signed)
ANTIBIOTIC CONSULT NOTE - INITIAL  Pharmacy Consult for Vancomycin and Aztreonam Indication: sepsis  Allergies  Allergen Reactions  . Penicillins     Childhood allergy    Patient Measurements: Height: 5\' 8"  (172.7 cm) Weight: 144 lb (65.318 kg) IBW/kg (Calculated) : 68.4  Vital Signs: Temp: 97.6 F (36.4 C) (06/06 0310) Temp Source: Oral (06/06 0310) BP: 123/106 mmHg (06/06 0415) Pulse Rate: 121 (06/06 0415) Intake/Output from previous day: 06/05 0701 - 06/06 0700 In: 3050 [I.V.:3050] Out: 110 [Urine:110] Intake/Output from this shift: Total I/O In: 3050 [I.V.:3050] Out: 110 [Urine:110]  Labs:  Recent Labs  01/22/15 0307  WBC 23.9*  HGB 14.7  PLT 393  CREATININE 2.98*   Estimated Creatinine Clearance: 31 mL/min (by C-G formula based on Cr of 2.98). No results for input(s): VANCOTROUGH, VANCOPEAK, VANCORANDOM, GENTTROUGH, GENTPEAK, GENTRANDOM, TOBRATROUGH, TOBRAPEAK, TOBRARND, AMIKACINPEAK, AMIKACINTROU, AMIKACIN in the last 72 hours.   Microbiology: No results found for this or any previous visit (from the past 720 hour(s)).  Medical History: Past Medical History  Diagnosis Date  . Eczema   . Type 1 diabetes mellitus   . Psoriasis   . GERD (gastroesophageal reflux disease)     Medications:  See electronic med rec  Assessment: 39 y.o. male presents with hyperglycemia. To continue broad spectrum abx (Vancomycin and Aztreonam) for sepsis. WBC elevated to 23.9. LA 5.4. SCr 2.98, est CrCl 30 ml/min. Afeb. Ceftazidime 1gm and Vanc 1.5gm IV given ~0340  Goal of Therapy:  Vancomycin trough level 15-20 mcg/ml  Plan:  Aztreonam 1gm IV q8h Vancomycin 1gm IV q24h Will f/u renal function, pt's clinical condition, and micro data Vanc trough prn  Sherlon Handing, PharmD, BCPS Clinical pharmacist, pager [phone] 01/22/2015,4:41 AM

## 2015-01-22 NOTE — H&P (Signed)
[facility] History and Physical  DONAL LYNAM ZSW:[id number] DOB: [birth year] DOA: 01/22/2015  Referring physician: ED physician PCP: Lorayne Marek, MD  Specialists:   Chief Complaint: Nausea, vomiting, fatigue  HPI: Jared Murray is a 39 y.o. male with PMH of uncontrolled diabetes mellitus, GERD, psoriasis, eczema, tobacco abuse, drug abuse, who presents with the nausea, vomiting and fatigue.  Patient reports that since yesterday he started having nausea, vomiting and tiredness. He vomited more than 10 times without blood in the vomitus. No diarrhea. He has mild abdominal pain, no fever or chills. No chest pain or shortness of breath. He states that he skipped his insulin dosage this morning. However, 30 units of Lantus taken yesterday as normal. He has chronic skin lesions due to psoriasis.   Currently patient denies fever, chills, running nose, ear pain, headaches, cough, chest pain, SOB, diarrhea, constipation, dysuria, urgency, frequency, hematuria, skin rashes, joint pain or leg swelling. No unilateral weakness, numbness or tingling sensations. No vision change or hearing loss.  In ED, patient was found to have DKA with AG=37,  bicarbonate 8, potassium 7.0 with T-wave peaking on EKG, lactate 5.40, normal temperature, tachycardia, negative troponin, AKI. Patient is admitted to inpatient for further evaluation and treatment.  Where does patient live?   At home    Can patient participate in ADLs?  Yes    Review of Systems:   General: no fevers, chills, no changes in body weight, has poor appetite, has fatigue HEENT: no blurry vision, hearing changes or sore throat Pulm: no dyspnea, coughing, wheezing CV: no chest pain, palpitations Abd: has nausea, vomiting, abdominal pain, no diarrhea, constipation GU: no dysuria, burning on urination, increased urinary frequency, hematuria  Ext: no leg edema Neuro: no unilateral weakness, numbness, or tingling, no vision change or  hearing loss Skin: has skin rash MSK: No muscle spasm, no deformity, no limitation of range of movement in spin Heme: No easy bruising.  Travel history: No recent long distant travel.  Allergy:  Allergies  Allergen Reactions  . Penicillins     Childhood allergy    Past Medical History  Diagnosis Date  . Eczema   . Type 1 diabetes mellitus   . Psoriasis   . GERD (gastroesophageal reflux disease)     Past Surgical History  Procedure Laterality Date  . Finger surgery      Social History:  reports that he has been smoking Cigarettes.  He does not have any smokeless tobacco history on file. He reports that he drinks alcohol. He reports that he uses illicit drugs (Marijuana).  Family History:  Family History  Problem Relation Age of Onset  . Hypertension Father   . Diabetes      multiple  . Lupus Cousin   . Stroke Maternal Grandmother   . Stroke Paternal Grandmother      Prior to Admission medications   Medication Sig Start Date End Date Taking? Authorizing Provider  clobetasol cream (TEMOVATE) 3.22 % Apply 1 application topically 2 (two) times daily. 08/04/14   Lorayne Marek, MD  ibuprofen (ADVIL,MOTRIN) 600 MG tablet Take 1 tablet (600 mg total) by mouth every 8 (eight) hours as needed. Patient not taking: Reported on 08/04/2014 05/10/14   Lorayne Marek, MD  insulin glargine (LANTUS) 100 UNIT/ML injection Inject 0.3 mLs (30 Units total) into the skin daily. 10/12/14   Ripudeep Krystal Eaton, MD  Insulin Syringe-Needle U-100 (B-D INS SYRINGE 2CC/29GX1/2") 29G X 1/2" 2 ML MISC Check Blood sugar TID and QHS  03/22/14   Olugbemiga Essie Christine, MD  NOVOLOG 100 UNIT/ML injection Sliding scale CBG 70 - 120: 0 units CBG 121 - 150: 1 unit,  CBG 151 - 200: 2 units,  CBG 201 - 250: 3 units,  CBG 251 - 300: 5 units,  CBG 301 - 350: 7 units,  CBG 351 - 400: 9 units   CBG > 400: 10 units 10/12/14   Ripudeep K Rai, MD  pantoprazole (PROTONIX) 40 MG tablet Take 1 tablet (40 mg total) by mouth  daily. Patient not taking: Reported on 11/13/2014 07/03/14   Silver Huguenin Elgergawy, MD  pantoprazole (PROTONIX) 40 MG tablet Take 1 tablet (40 mg total) by mouth daily. 10/12/14   Ripudeep Krystal Eaton, MD  promethazine (PHENERGAN) 12.5 MG tablet Take 1 tablet (12.5 mg total) by mouth every 6 (six) hours as needed for nausea or vomiting. 10/12/14   Ripudeep Krystal Eaton, MD  triamcinolone cream (KENALOG) 0.1 % Apply 1 application topically 2 (two) times daily. 08/04/14   Lorayne Marek, MD    Physical Exam: Filed Vitals:   01/22/15 0415 01/22/15 0430 01/22/15 0445 01/22/15 0500  BP: 123/106 88/63 114/79   Pulse: 121 117 112 109  Temp:      TempSrc:      Resp: 24 24 20 23   Height:      Weight:      SpO2: 100% 100% 100% 100%   General: Not in acute distress. Dry mucus and membrane, very tired looking HEENT:       Eyes: PERRL, EOMI, no scleral icterus.       ENT: No discharge from the ears and nose, no pharynx injection, no tonsillar enlargement.        Neck: No JVD, no bruit, no mass felt. Heme: No neck lymph node enlargement. Cardiac: S1/S2, RRR, tachycardia, No murmurs, No gallops or rubs. Pulm: No rales, wheezing, rhonchi or rubs. Abd: Soft, nondistended, nontender, no rebound pain, no organomegaly, BS present. Ext: No pitting leg edema bilaterally. 2+DP/PT pulse bilaterally. Musculoskeletal: No joint deformities, No joint redness or warmth, no limitation of ROM in spin. Skin: Psoriasis skin lesions, particularly to right elbow, ventral abdomen, bilateral ankles. No obvious signs of infection. Neuro: Alert, oriented X3, cranial nerves II-XII grossly intact, muscle strength 5/5 in all extremities, sensation to light touch intact.  Psych: Patient is not psychotic, no suicidal or hemocidal ideation.  Labs on Admission:  Basic Metabolic Panel:  Recent Labs Lab 01/22/15 0307  NA 135  K 7.0*  CL 90*  CO2 8*  GLUCOSE 888*  BUN 23*  CREATININE 2.98*  CALCIUM 8.7*   Liver Function  Tests:  Recent Labs Lab 01/22/15 0307  AST 43*  ALT 26  ALKPHOS 169*  BILITOT 2.1*  PROT 7.9  ALBUMIN 4.4   No results for input(s): LIPASE, AMYLASE in the last 168 hours. No results for input(s): AMMONIA in the last 168 hours. CBC:  Recent Labs Lab 01/22/15 0307  WBC 23.9*  NEUTROABS 21.9*  HGB 14.7  HCT 44.0  MCV 98.9  PLT 393   Cardiac Enzymes: No results for input(s): CKTOTAL, CKMB, CKMBINDEX, TROPONINI in the last 168 hours.  BNP (last 3 results) No results for input(s): BNP in the last 8760 hours.  ProBNP (last 3 results) No results for input(s): PROBNP in the last 8760 hours.  CBG:  Recent Labs Lab 01/22/15 0305 01/22/15 0448  GLUCAP >600* 503*    Radiological Exams on Admission: Dg Chest [facility] D[facility]  01/22/2015   CLINICAL DATA:  Nausea, vomiting, hyperglycemia.  EXAM: PORTABLE CHEST - 1 VIEW  COMPARISON:  10/10/2014  FINDINGS: The cardiomediastinal contours are normal. The lungs are clear. Pulmonary vasculature is normal. No consolidation, pleural effusion, or pneumothorax. No acute osseous abnormalities are seen. Lateral most lower left ribs excluded from the field of view. Chronic change about the left shoulder.  IMPRESSION: No acute pulmonary process.   Electronically Signed   By: Jeb Levering M.D.   On: 01/22/2015 03:30    EKG: Independently reviewed.  Abnormal findings: QTC 648, bifasicular AV block, T-wave peaking.   Assessment/Plan Principal Problem:   DKA (diabetic ketoacidoses) Active Problems:   Psoriasis   Dehydration   Sepsis   Nausea & vomiting   GERD (gastroesophageal reflux disease)   AKI (acute kidney injury)   Type II diabetes mellitus with complication, uncontrolled  DKA: patient has severe DKA with AG of 37 and bicarbonate of 8. Mental status is OK. No obvious source of infection identified - Admit to stepdown  - Received 3L of NS bolus - start DKA protocol with BMP q2h - IVF: NS 150 cc/h; will switch to D5-1/2NS when  CBG<250 - replete K as needed, currently hyperkalemia with K=7.0 - Hydroxyzine prn nausea  - NPO  -check Lipase level  Sepsis or SIRS: She has leukocytosis, tachycardia and lactate 5.40. No obvious source of infection identified. UA negative. -will get Procalcitonin and trend lactic acid level per sepsis protocol -IVF: received 3L of NS bolus in ED, followed by 150 cc/h -Ed started the vancomycin and cefotaxime which will be switched to aztreonam due to allergy to penicillin - blood culture x 2, Ux  Hyperkalemia: Potassium 7.0 with T-wave change. Secondary to DKA and AKi. -Treated with calcium and insulin in emergency room -Expecting correction with insulin drip -BMP q2h   DM-II: Last A1c 13.4 on 10/12/14, poorly controlled. Patient is taking Lantus 30 units daily and sliding scale insulin at home -On insulin gtt for DKA now  Psoriasis: -continue home Temovate and Kenalog topically  GERD: -Protonix  AKI: likely due to prerenal secondary to dehydration and continuation of NSAIDs -IVF as above -Check FeNa  -US-renal -Follow up renal function by BMP -Hold ibuprofen  Tobacco abuse: -Did counseling about importance of quitting smoking -Nicotine patch   DVT ppx: SQ Heparin      Code Status: Full code Family Communication: None at bed side. Disposition Plan: Admit to inpatient   Date of Service 01/22/2015    Ivor Costa [facility] Pager [phone]  If 7PM-7AM, please contact night-coverage [email or web address] Password [email or web address] 01/22/2015, 5:10 AM

## 2015-01-22 NOTE — Progress Notes (Signed)
Pt seen and examined, admitted this am with DKA, bicarb improving, hyperkalemia resolved Will stop IV Vanc and Zosyn, afebrile, i think elevated lactic acid, elevated WBC is reactive and from severe hemoconcentration and dehydration -change bmet to Q4, continue Insulin gtt, and IVF -last Hbaic was 13 in 2/16  Domenic Polite, MD [phone]

## 2015-01-22 NOTE — ED Notes (Signed)
Pt arrives with hyperglycemia, EMS reading HIGH. 30 units of lantus last night. EMS EKG shows ischemia and ST elevation.

## 2015-01-23 LAB — HEPATITIS PANEL, ACUTE
HEP B C IGM: NEGATIVE
Hep A IgM: NEGATIVE
Hepatitis B Surface Ag: NEGATIVE

## 2015-01-23 LAB — GLUCOSE, CAPILLARY
GLUCOSE-CAPILLARY: 107 mg/dL — AB (ref 65–99)
GLUCOSE-CAPILLARY: 126 mg/dL — AB (ref 65–99)
GLUCOSE-CAPILLARY: 138 mg/dL — AB (ref 65–99)
GLUCOSE-CAPILLARY: 149 mg/dL — AB (ref 65–99)
GLUCOSE-CAPILLARY: 69 mg/dL (ref 65–99)
GLUCOSE-CAPILLARY: 85 mg/dL (ref 65–99)
Glucose-Capillary: 105 mg/dL — ABNORMAL HIGH (ref 65–99)
Glucose-Capillary: 129 mg/dL — ABNORMAL HIGH (ref 65–99)
Glucose-Capillary: 133 mg/dL — ABNORMAL HIGH (ref 65–99)
Glucose-Capillary: 139 mg/dL — ABNORMAL HIGH (ref 65–99)
Glucose-Capillary: 148 mg/dL — ABNORMAL HIGH (ref 65–99)
Glucose-Capillary: 151 mg/dL — ABNORMAL HIGH (ref 65–99)
Glucose-Capillary: 153 mg/dL — ABNORMAL HIGH (ref 65–99)
Glucose-Capillary: 183 mg/dL — ABNORMAL HIGH (ref 65–99)
Glucose-Capillary: 189 mg/dL — ABNORMAL HIGH (ref 65–99)
Glucose-Capillary: 52 mg/dL — ABNORMAL LOW (ref 65–99)
Glucose-Capillary: 53 mg/dL — ABNORMAL LOW (ref 65–99)
Glucose-Capillary: 92 mg/dL (ref 65–99)
Glucose-Capillary: 93 mg/dL (ref 65–99)
Glucose-Capillary: 98 mg/dL (ref 65–99)

## 2015-01-23 LAB — BASIC METABOLIC PANEL
Anion gap: 7 (ref 5–15)
Anion gap: 8 (ref 5–15)
BUN: 7 mg/dL (ref 6–20)
BUN: <5 mg/dL — ABNORMAL LOW (ref 6–20)
CHLORIDE: 113 mmol/L — AB (ref 101–111)
CO2: 19 mmol/L — ABNORMAL LOW (ref 22–32)
CO2: 19 mmol/L — AB (ref 22–32)
CREATININE: 0.63 mg/dL (ref 0.61–1.24)
Calcium: 7.7 mg/dL — ABNORMAL LOW (ref 8.9–10.3)
Calcium: 7.7 mg/dL — ABNORMAL LOW (ref 8.9–10.3)
Chloride: 109 mmol/L (ref 101–111)
Creatinine, Ser: 0.73 mg/dL (ref 0.61–1.24)
GFR calc non Af Amer: >60 mL/min (ref >60)
GLUCOSE: 140 mg/dL — AB (ref 65–99)
Glucose, Bld: 117 mg/dL — ABNORMAL HIGH (ref 65–99)
POTASSIUM: 3.4 mmol/L — AB (ref 3.5–5.1)
Potassium: 2.9 mmol/L — ABNORMAL LOW (ref 3.5–5.1)
SODIUM: 139 mmol/L (ref 135–145)
Sodium: 136 mmol/L (ref 135–145)

## 2015-01-23 LAB — URINE CULTURE
COLONY COUNT: NO GROWTH
CULTURE: NO GROWTH

## 2015-01-23 LAB — CBC
HCT: 32.4 % — ABNORMAL LOW (ref 39.0–52.0)
HEMOGLOBIN: 11.6 g/dL — AB (ref 13.0–17.0)
MCH: 32.4 pg (ref 26.0–34.0)
MCHC: 35.8 g/dL (ref 30.0–36.0)
MCV: 90.5 fL (ref 78.0–100.0)
Platelets: 211 10*3/uL (ref 150–400)
RBC: 3.58 MIL/uL — AB (ref 4.22–5.81)
RDW: 11.9 % (ref 11.5–15.5)
WBC: 10.9 10*3/uL — ABNORMAL HIGH (ref 4.0–10.5)

## 2015-01-23 LAB — HEMOGLOBIN A1C
Hgb A1c MFr Bld: 14.3 % — ABNORMAL HIGH (ref 4.8–5.6)
MEAN PLASMA GLUCOSE: 364 mg/dL

## 2015-01-23 LAB — LACTIC ACID, PLASMA: Lactic Acid, Venous: 3.9 mmol/L (ref 0.5–2.0)

## 2015-01-23 MED ORDER — DEXTROSE-NACL 5-0.9 % IV SOLN
INTRAVENOUS | Status: DC
  Administered 2015-01-23: 75 mL/h via INTRAVENOUS

## 2015-01-23 MED ORDER — SODIUM CHLORIDE 0.9 % IV BOLUS (SEPSIS)
1000.0000 mL | Freq: Once | INTRAVENOUS | Status: AC
  Administered 2015-01-23: 1000 mL via INTRAVENOUS

## 2015-01-23 MED ORDER — TRAMADOL HCL 50 MG PO TABS
50.0000 mg | ORAL_TABLET | Freq: Four times a day (QID) | ORAL | Status: DC | PRN
  Administered 2015-01-23: 50 mg via ORAL
  Filled 2015-01-23: qty 1

## 2015-01-23 MED ORDER — POTASSIUM CHLORIDE 10 MEQ/100ML IV SOLN
10.0000 meq | INTRAVENOUS | Status: AC
  Administered 2015-01-23 (×2): 10 meq via INTRAVENOUS
  Filled 2015-01-23 (×2): qty 100

## 2015-01-23 MED ORDER — SODIUM CHLORIDE 0.9 % IV SOLN
INTRAVENOUS | Status: DC
  Administered 2015-01-23 – 2015-01-24 (×2): via INTRAVENOUS

## 2015-01-23 MED ORDER — INSULIN GLARGINE 100 UNIT/ML ~~LOC~~ SOLN
30.0000 [IU] | Freq: Every day | SUBCUTANEOUS | Status: DC
  Administered 2015-01-23: 30 [IU] via SUBCUTANEOUS
  Filled 2015-01-23 (×2): qty 0.3

## 2015-01-23 MED ORDER — VANCOMYCIN HCL IN DEXTROSE 1-5 GM/200ML-% IV SOLN
1000.0000 mg | Freq: Three times a day (TID) | INTRAVENOUS | Status: DC
  Administered 2015-01-23 – 2015-01-24 (×3): 1000 mg via INTRAVENOUS
  Filled 2015-01-23 (×6): qty 200

## 2015-01-23 MED ORDER — ACETAMINOPHEN 325 MG PO TABS: 650.0000 mg | ORAL_TABLET | Freq: Four times a day (QID) | ORAL | Status: DC | PRN

## 2015-01-23 MED ORDER — LIVING WELL WITH DIABETES BOOK
Freq: Once | Status: DC
  Filled 2015-01-23: qty 1

## 2015-01-23 MED ORDER — POTASSIUM CHLORIDE CRYS ER 20 MEQ PO TBCR
60.0000 meq | EXTENDED_RELEASE_TABLET | Freq: Once | ORAL | Status: AC
  Administered 2015-01-23: 60 meq via ORAL
  Filled 2015-01-23: qty 3

## 2015-01-23 MED ORDER — INSULIN ASPART 100 UNIT/ML ~~LOC~~ SOLN: 0.0000 [IU] | Freq: Three times a day (TID) | SUBCUTANEOUS | Status: DC

## 2015-01-23 NOTE — Progress Notes (Signed)
FRAN[facility] SVEC [id number] Admission Data: 01/23/2015 11:23 AM Attending Provider: Domenic Polite, MD  TDD:[id number], Vernon Prey, MD Consults/ Treatment Team:    [name] is a 39 y.o. male patient admitted from ED awake, alert  & orientated  X 3,  Full Code, VSS - Blood pressure 118/77, pulse 77, temperature 98.4 F (36.9 C), temperature source Oral, resp. rate 16, height 5\' 8"  (1.727 m), weight 65.318 kg (144 lb), SpO2 100 %., no c/o shortness of breath, no c/o chest pain, no distress noted.  IV site WDL:  forearm left, condition patent and no redness and antecubital left, condition patent and no redness with a transparent dsg that's clean dry and intact.  Allergies:   Allergies  Allergen Reactions  . Penicillins     Childhood allergy     Past Medical History  Diagnosis Date  . Eczema   . Type 1 diabetes mellitus   . Psoriasis   . GERD (gastroesophageal reflux disease)     History:  obtained from the patient. Tobacco/alcohol: Smokes a couple cigarettes a day,  social drinker  Pt orientation to unit, room and routine. Information packet given to patient/family and safety video watched.  Admission INP armband ID verified with patient/family, and in place. SR up x 2, fall risk assessment complete with Patient and family verbalizing understanding of risks associated with falls. Pt verbalizes an understanding of how to use the call bell and to call for help before getting out of bed.  Skin, clean-dry- intact without evidence of bruising, or skin tears.   No evidence of skin break down noted on exam. no rashes, no ecchymoses, no petechiae. Patient does have psoriasis and ezcema.     Will cont to monitor and assist as needed.  Kathyleen Radice [name], RN 01/23/2015 11:23 AM

## 2015-01-23 NOTE — Progress Notes (Addendum)
Inpatient Diabetes Program Recommendations  AACE/ADA: New Consensus Statement on Inpatient Glycemic Control (2013)  Target Ranges:  Prepandial:   less than 140 mg/dL      Peak postprandial:   less than 180 mg/dL (1-2 hours)      Critically ill patients:  140 - 180 mg/dL   Reason for Visit: Referral received.  Note patient admitted with DKA.    Diabetes history: Type 2 diabetes/ DKA Outpatient Diabetes medications: Lantus 30 units daily, Novolog correction Current orders for Inpatient glycemic control:  Lantus 30 units daily, IV insulin  Note that patient transitioned off insulin drip this morning.  Text paged MD to request Subcutaneous insulin orders.  Spoke with patient regarding potential cause of DKA.  He states that he went fishing Saturday and did not take any Novolog all day/or monitor his CBG's .  States he thinks that he got dehydrated.  When he got home he went to bed and also did not take his Lantus or monitor his CBG.  Discussed importance of taking insulin/CBG's consistently in order to prevent DKA.  He states that he "Did not eat much" and thought his blood sugars would be okay.  Discussed need to monitor and administer insulin as needed.  Also discussed dehydration component of DKA.  Needs continued follow[facility]up at [facility].  He states he has "[facility]" who comes to his home for education.  He has supplies and monitor.  States he has no questions at this time.   Thanks, Adah Perl, RN, BC-ADM Inpatient Diabetes Coordinator Pager [phone] (8a-5p)

## 2015-01-23 NOTE — Progress Notes (Signed)
Notified KBaltazar Najjar of positive blood cultures.  Anaerobic, gram positive cocci in clusters.  Allen Derry, RN

## 2015-01-23 NOTE — Progress Notes (Signed)
Received report from [facility]

## 2015-01-23 NOTE — Clinical Documentation Improvement (Addendum)
  Query #1 Conflicting documentation exists in the current medical record.  Type 1 DM and Type 2 DM is documented.  Please clarify and document which type of DM is correct for this patient.  Please document your response in the progress notes and discharge summary.   Thank You, Erling Conte ,RN Clinical Documentation Specialist:  [phone] [facility]

## 2015-01-23 NOTE — Progress Notes (Signed)
Hypoglycemic Event  CBG:53  Treatment: 15 GM carbohydrate snack  Symptoms: Hungry  Follow-up CBG: Time:2310 CBG Result:92  Possible Reasons for Event: Inadequate meal intake  Comments/MD notified:MD on call     Jared Murray  Remember to initiate Hypoglycemia Order Set & complete

## 2015-01-23 NOTE — Progress Notes (Signed)
Utilization review completed.  L J Avelardo Reesman RN, BSN, CM  [phone] 

## 2015-01-23 NOTE — Progress Notes (Signed)
Hypoglycemic Event  CBG: 52  Treatment: 15 GM carbohydrate snack  Symptoms: Nervous/irritable  Follow-up CBG: Time:1400 CBG Result:149  Possible Reasons for Event: Inadequate meal intake    Jared Murray [name]  Remember to initiate Hypoglycemia Order Set & complete

## 2015-01-23 NOTE — Progress Notes (Signed)
[facility] PROGRESS NOTE  Jared Murray VFI:[id number] DOB: 07-14-1976 DOA: 01/22/2015 PCP: Lorayne Marek, MD  Assessment/Plan: DKA: admitted with severe DKA with AG of 37 and bicarbonate of 8. -improved with IVF and Insulin gtt -anion gap closed overnight, will transition to lantus and SSI -change IVF  -replace K -resume diet -DM coordinator consult  SIRS:  Suspect due to hemoconcentration and DKA -1/2 blood Cx with GPC in clusters -empiric Vanc, likely contaminant, await speciation -no clear source of infection  Hyperkalemia: Potassium 7.0 with T-wave change. Secondary to DKA and AKi. -Treated with calcium and insulin in emergency room -resolved  DM-II: Last A1c 13.4 on 10/12/14, now Hbaic 14.3, doubt compliance  Psoriasis: -continue home Temovate and Kenalog topically  GERD: -Protonix  AKI: likely due to prerenal secondary to dehydration and continuation of NSAIDs -resolved with IVF  Tobacco abuse: -counseled -Nicotine patch  DVT ppx: SQ Heparin   Code Status: Full Code Family Communication: none at bedside Disposition Plan: Tx out of SDU     HPI/Subjective: Feels better, hungry, denies any N/V/D, no abd pain, no cough, congestion  Objective: Filed Vitals:   01/23/15 1308  BP: 128/76  Pulse: 77  Temp: 97.7 F (36.5 C)  Resp: 16    Intake/Output Summary (Last 24 hours) at 01/23/15 1336 Last data filed at 01/23/15 1306  Gross per 24 hour  Intake 2351.44 ml  Output    750 ml  Net 1601.44 ml   Filed Weights   01/22/15 0310  Weight: 65.318 kg (144 lb)    Exam:   General:  AAOx3, no distress  Cardiovascular: S1S2/RRR  Respiratory: CTAB  Abdomen: soft, NT, BS present  Musculoskeletal: no edema c/c  Skin:  Diffuse psoriatic lesions  Data Reviewed: Basic Metabolic Panel:  Recent Labs Lab 01/22/15 1210 01/22/15 1802 01/22/15 2142 01/23/15 0127 01/23/15 0851  NA 142 143 141 139 136  K 4.1 3.7 4.1 3.4* 2.9*  CL  115* 115* 114* 113* 109  CO2 14* 18* 19* 19* 19*  GLUCOSE 229* 75 102* 140* 117*  BUN 14 12 12 7  <5*  CREATININE 1.43* 0.90 0.90 0.73 0.63  CALCIUM 8.0* 8.1* 7.8* 7.7* 7.7*   Liver Function Tests:  Recent Labs Lab 01/22/15 0307  AST 43*  ALT 26  ALKPHOS 169*  BILITOT 2.1*  PROT 7.9  ALBUMIN 4.4    Recent Labs Lab 01/22/15 0521  LIPASE 345*   No results for input(s): AMMONIA in the last 168 hours. CBC:  Recent Labs Lab 01/22/15 0307 01/23/15 0127  WBC 23.9* 10.9*  NEUTROABS 21.9*  --   HGB 14.7 11.6*  HCT 44.0 32.4*  MCV 98.9 90.5  PLT 393 211   Cardiac Enzymes: No results for input(s): CKTOTAL, CKMB, CKMBINDEX, TROPONINI in the last 168 hours. BNP (last 3 results) No results for input(s): BNP in the last 8760 hours.  ProBNP (last 3 results) No results for input(s): PROBNP in the last 8760 hours.  CBG:  Recent Labs Lab 01/23/15 0633 01/23/15 0720 01/23/15 0807 01/23/15 0923 01/23/15 1304  GLUCAP 183* 189* 151* 107* 52*    Recent Results (from the past 240 hour(s))  Culture, blood (routine x 2)     Status: None (Preliminary result)   Collection Time: 01/22/15  3:29 AM  Result Value Ref Range Status   Specimen Description BLOOD RIGHT ARM  Final   Special Requests BOTTLES DRAWN AEROBIC ONLY 4CC  Final   Culture   Final  BLOOD CULTURE RECEIVED NO GROWTH TO DATE CULTURE WILL BE HELD FOR 5 DAYS BEFORE ISSUING A FINAL NEGATIVE REPORT Performed at [facility]    Report Status PENDING  Incomplete  Culture, blood (routine x 2)     Status: None (Preliminary result)   Collection Time: 01/22/15  3:41 AM  Result Value Ref Range Status   Specimen Description BLOOD RIGHT HAND  Final   Special Requests BOTTLES DRAWN AEROBIC AND ANAEROBIC 5CC EA  Final   Culture   Final    GRAM POSITIVE COCCI IN CLUSTERS Note: Gram Stain Report Called to,Read Back By and Verified With: AVERY DANIEL [id number]@240AM  BJENN Performed at [facility]     Report Status PENDING  Incomplete  Urine culture     Status: None   Collection Time: 01/22/15  4:16 AM  Result Value Ref Range Status   Specimen Description URINE, CLEAN CATCH  Final   Special Requests NONE  Final   Colony Count NO GROWTH Performed at 19/06/16   Final   Culture NO GROWTH Performed at [facility]   Final   Report Status 01/23/2015 FINAL  Final  MRSA PCR Screening     Status: None   Collection Time: 01/22/15  5:42 PM  Result Value Ref Range Status   MRSA by PCR NEGATIVE NEGATIVE Final    Comment:        The GeneXpert MRSA Assay (FDA approved for NASAL specimens only), is one component of a comprehensive MRSA colonization surveillance program. It is not intended to diagnose MRSA infection nor to guide or monitor treatment for MRSA infections.      Studies: 19/06/16 Renal  01/22/2015   CLINICAL DATA:  Acute kidney injury. History of diabetes mellitus. Initial encounter.  EXAM: RENAL / URINARY TRACT ULTRASOUND COMPLETE  COMPARISON:  None.  FINDINGS: Right Kidney:  Length: 11.7 cm. There is a small cyst in the interpolar region measuring 1.4 cm maximally. No suspicious finding or hydronephrosis.  Left Kidney:  Length: 11.4 cm. Echogenicity within normal limits. No mass or hydronephrosis visualized.  Bladder:  Appears normal for degree of bladder distention.  IMPRESSION: Small right renal cyst. Otherwise normal renal ultrasound. No hydronephrosis.   Electronically Signed   By: 19/01/2015 M.D.   On: 01/22/2015 07:03   Dg Chest Port 1 View  01/22/2015   CLINICAL DATA:  Nausea, vomiting, hyperglycemia.  EXAM: PORTABLE CHEST - 1 VIEW  COMPARISON:  10/10/2014  FINDINGS: The cardiomediastinal contours are normal. The lungs are clear. Pulmonary vasculature is normal. No consolidation, pleural effusion, or pneumothorax. No acute osseous abnormalities are seen. Lateral most lower left ribs excluded from the field of view. Chronic change about the left shoulder.   IMPRESSION: No acute pulmonary process.   Electronically Signed   By: 10/23/2014 M.D.   On: 01/22/2015 03:30    Scheduled Meds: . clobetasol cream  1 application Topical BID  . heparin  5,000 Units Subcutaneous 3 times per day  . insulin aspart  0-15 Units Subcutaneous TID WC  . insulin glargine  30 Units Subcutaneous Daily  . nicotine  21 mg Transdermal Daily  . pantoprazole  40 mg Oral Daily  . triamcinolone cream  1 application Topical BID  . vancomycin  1,000 mg Intravenous Q8H   Continuous Infusions: . sodium chloride 75 mL/hr at 01/23/15 1209   Antibiotics Given (last 72 hours)    Date/Time Action Medication Dose Rate   01/23/15 0431 Given  vancomycin (VANCOCIN) IVPB 1000 mg/200 mL premix 1,000 mg 200 mL/hr   01/23/15 1310 Given   vancomycin (VANCOCIN) IVPB 1000 mg/200 mL premix 1,000 mg 200 mL/hr      Principal Problem:   DKA (diabetic ketoacidoses) Active Problems:   Psoriasis   Dehydration   Sepsis   Nausea & vomiting   GERD (gastroesophageal reflux disease)   AKI (acute kidney injury)   Type II diabetes mellitus with complication, uncontrolled    Time spent: 4min    Milany Geck  [facility] Pager [phone]. If 7PM-7AM, please contact night-coverage at [email or web address], password [email or web address] 01/23/2015, 1:36 PM  LOS: 1 day

## 2015-01-23 NOTE — Progress Notes (Signed)
ANTIBIOTIC CONSULT NOTE   Pharmacy Consult for Vancomycin Indication: GPC in blood culture  Allergies  Allergen Reactions  . Penicillins     Childhood allergy    Patient Measurements: Height: 5\' 8"  (172.7 cm) Weight: 144 lb (65.318 kg) IBW/kg (Calculated) : 68.4  Vital Signs: Temp: 97.8 F (36.6 C) (06/07 0400) Temp Source: Oral (06/07 0400) BP: 109/77 mmHg (06/07 0400) Pulse Rate: 65 (06/07 0600) Intake/Output from previous day: 06/06 0701 - 06/07 0700 In: 1652.9 [I.V.:1002.9; IV Piggyback:650] Out: 1050 [Urine:1050] Intake/Output from this shift:    Labs:  Recent Labs  01/22/15 0307 01/22/15 0416  01/22/15 1802 01/22/15 2142 01/23/15 0127  WBC 23.9*  --   --   --   --  10.9*  HGB 14.7  --   --   --   --  11.6*  PLT 393  --   --   --   --  211  LABCREA  --  34.24  --   --   --   --   CREATININE 2.98*  --   < > 0.90 0.90 0.73  < > = values in this interval not displayed. Estimated Creatinine Clearance: 115.6 mL/min (by C-G formula based on Cr of 0.73). No results for input(s): VANCOTROUGH, VANCOPEAK, VANCORANDOM, GENTTROUGH, GENTPEAK, GENTRANDOM, TOBRATROUGH, TOBRAPEAK, TOBRARND, AMIKACINPEAK, AMIKACINTROU, AMIKACIN in the last 72 hours.   Microbiology: Recent Results (from the past 720 hour(s))  MRSA PCR Screening     Status: None   Collection Time: 01/22/15  5:42 PM  Result Value Ref Range Status   MRSA by PCR NEGATIVE NEGATIVE Final    Comment:        The GeneXpert MRSA Assay (FDA approved for NASAL specimens only), is one component of a comprehensive MRSA colonization surveillance program. It is not intended to diagnose MRSA infection nor to guide or monitor treatment for MRSA infections.     Medical History: Past Medical History  Diagnosis Date  . Eczema   . Type 1 diabetes mellitus   . Psoriasis   . GERD (gastroesophageal reflux disease)     Medications:  See electronic med rec  Assessment: 39 y.o. male presented with hyperglycemia.  Initially started on broad spectrum abx (Vancomycin and Aztreonam) for sepsis. Antibiotics were supposed to be d/c yesterday but consult just d/c'd and actual abx orders were left active. Now pt with GPC in blood culture so to begin/continue Vancomcycin. SCr down to 0.73, est CrCl > 100 ml/min. WBC down to 10.9. LA down to 2.3. PCT 1.36.  Goal of Therapy:  Vancomycin trough level 15-20 mcg/ml  Plan:  Change Vancomycin to 1gm IV q8h Will f/u renal function, pt's clinical condition, and micro data Vanc trough prn  Sherlon Handing, PharmD, BCPS Clinical pharmacist, pager [phone] 01/23/2015,7:21 AM

## 2015-01-23 NOTE — Progress Notes (Signed)
Hypoglycemic Event  CBG: 69  Treatment: 15 GM carbohydrate snack  Symptoms: None  Follow-up CBG: Time:1815 CBG Result:85  Possible Reasons for Event: Inadequate meal intake  Jared Murray [name]  Remember to initiate Hypoglycemia Order Set & complete

## 2015-01-24 DIAGNOSIS — K219 Gastro-esophageal reflux disease without esophagitis: Secondary | ICD-10-CM

## 2015-01-24 DIAGNOSIS — E1310 Other specified diabetes mellitus with ketoacidosis without coma: Secondary | ICD-10-CM

## 2015-01-24 DIAGNOSIS — E860 Dehydration: Secondary | ICD-10-CM

## 2015-01-24 DIAGNOSIS — N179 Acute kidney failure, unspecified: Secondary | ICD-10-CM

## 2015-01-24 LAB — CBC
HEMATOCRIT: 30.5 % — AB (ref 39.0–52.0)
Hemoglobin: 10.9 g/dL — ABNORMAL LOW (ref 13.0–17.0)
MCH: 32.1 pg (ref 26.0–34.0)
MCHC: 35.7 g/dL (ref 30.0–36.0)
MCV: 89.7 fL (ref 78.0–100.0)
PLATELETS: 192 10*3/uL (ref 150–400)
RBC: 3.4 MIL/uL — ABNORMAL LOW (ref 4.22–5.81)
RDW: 12.2 % (ref 11.5–15.5)
WBC: 3.4 10*3/uL — AB (ref 4.0–10.5)

## 2015-01-24 LAB — GLUCOSE, CAPILLARY
GLUCOSE-CAPILLARY: 111 mg/dL — AB (ref 65–99)
Glucose-Capillary: 62 mg/dL — ABNORMAL LOW (ref 65–99)
Glucose-Capillary: 92 mg/dL (ref 65–99)
Glucose-Capillary: 51 mg/dL — ABNORMAL LOW (ref 65–99)
Glucose-Capillary: 206 mg/dL — ABNORMAL HIGH (ref 65–99)

## 2015-01-24 LAB — BASIC METABOLIC PANEL
ANION GAP: 6 (ref 5–15)
CO2: 24 mmol/L (ref 22–32)
Calcium: 7.7 mg/dL — ABNORMAL LOW (ref 8.9–10.3)
Chloride: 108 mmol/L (ref 101–111)
Creatinine, Ser: 0.44 mg/dL — ABNORMAL LOW (ref 0.61–1.24)
GFR calc Af Amer: >60 mL/min (ref >60)
GLUCOSE: 59 mg/dL — AB (ref 65–99)
Potassium: 3 mmol/L — ABNORMAL LOW (ref 3.5–5.1)
SODIUM: 138 mmol/L (ref 135–145)

## 2015-01-24 LAB — CULTURE, BLOOD (ROUTINE X 2)

## 2015-01-24 MED ORDER — INSULIN GLARGINE 100 UNIT/ML ~~LOC~~ SOLN: 25.0000 [IU] | Freq: Every day | SUBCUTANEOUS | Status: DC

## 2015-01-24 MED ORDER — POTASSIUM CHLORIDE CRYS ER 20 MEQ PO TBCR
40.0000 meq | EXTENDED_RELEASE_TABLET | Freq: Once | ORAL | Status: AC
  Administered 2015-01-24: 40 meq via ORAL
  Filled 2015-01-24: qty 2

## 2015-01-24 MED ORDER — INSULIN ASPART 100 UNIT/ML ~~LOC~~ SOLN
0.0000 [IU] | Freq: Three times a day (TID) | SUBCUTANEOUS | Status: DC
  Administered 2015-01-24: 3 [IU] via SUBCUTANEOUS

## 2015-01-24 MED ORDER — INSULIN ASPART 100 UNIT/ML ~~LOC~~ SOLN: 0.0000 [IU] | Freq: Every day | SUBCUTANEOUS | Status: DC

## 2015-01-24 MED ORDER — INSULIN GLARGINE 100 UNIT/ML ~~LOC~~ SOLN
15.0000 [IU] | Freq: Every day | SUBCUTANEOUS | Status: DC
  Administered 2015-01-24: 15 [IU] via SUBCUTANEOUS
  Filled 2015-01-24: qty 0.15

## 2015-01-24 NOTE — Progress Notes (Signed)
Hypoglycemic Event  CBG: 51  Treatment: 15 GM carbohydrate snack  Symptoms: None  Follow-up CBG: Time:0820 CBG Result:111  Possible Reasons for Event: Inadequate meal intake     Greycen Felter [name]  Remember to initiate Hypoglycemia Order Set & complete

## 2015-01-24 NOTE — Hospital Discharge Follow-Up (Signed)
Transitional Care Clinic Care Coordination Note:  Admit date:  01/22/15 Discharge date: 01/24/15 Discharge Disposition: Home with family Patient contact: [phone]  Emergency contact(s): [phone] ([place]-daughter)  This Case Manager reviewed patient's EMR and determined patient would benefit from post-discharge medical management and chronic care management services through the [facility]. Patient has a history of type 1 diabetes mellitus-admitted with severe DKA, SIRS, bacteremia, hyperkalemia. This Case Manager met with patient to discuss the services and medical management that can be provided at the [facility]. Patient verbalized understanding and agreed to receive post-discharge care at the [facility].   Patient scheduled for Transitional Care appointment on 01/29/15 at 1030 with Dr. Jarold Song.  Clinic information and appointment time provided to patient. Appointment information also placed on AVS.  Assessment:       Home Environment: Patient lives with spouse and children in a private residence.       Support System: Spouse, adult children       Level of functioning: independent with daily activities         Home DME: none       Home care services: no home health care. Patient does have a community Tourist information centre manager with [place]: private vehicle. Patient has his own vehicle and has transportation to appointments.        Food/Nutrition: Patient indicates he has access to the food he needs. He gets $388 of food stamps/month.        Medications: Patient indicates he uses the following pharmacies: [facility], [place], and [facility] for diabetes strips and needles. He indicates he has all needed medications at home as well as all needed diabetes supplies.        PCP:  Dr. Enos [facility] [place]  Patient Education: Discussed patient's compliance with diet and insulin. Patient indicates that he does not always eat a  low carb diet. He indicates his wife likes to cooks a lot of pasta. Thoroughly discussed alternative low carb options.  Patient indicates he checks blood glucose before meals and at night, but he indicates he does not always administer insulin in a timely manner or as he should.  Discussed importance of compliance with insulin. Patient verbalized understanding.            Arranged services:        Services communicated to Carles Collet, RN CM

## 2015-01-24 NOTE — Discharge Summary (Signed)
Physician Discharge Summary  [name] IRJ:[id number] DOB: 01-07-1976 DOA: 01/22/2015  PCP: Lorayne Marek, MD  Admit date: 01/22/2015 Discharge date: 01/24/2015  Recommendations for Outpatient Follow-up:  1. Pt will need to follow up with PCP in 1-2 weeks post discharge 2. Please obtain BMP in one week  Discharge Diagnoses:   DKA: admitted with severe DKA with AG of 37 and bicarbonate of 8. -improved with IVF and Insulin gtt -anion gap closed overnight, will transition to lantus and SSI -change IVF  -replaced K -resume diet which pt tolerated -DM coordinator consult -The patient will go home with Lantus 25 units daily -He was obstructed to check his CBGs before each meal and at bedtime and to take his glycemic log to his primary care provider whom will further adjust his diabetic regimen -After the patient was transitioned to subcutaneous insulin, the patient had a number of CBG is in the 50s-60s. His Lantus was decreased to 15 units daily in the inpatient setting -He will continue on his previous NovoLog sliding scale prior to admission  SIRS: Suspect due to hemoconcentration and DKA -1/2 blood Cx with GPC in clusters-->coagulase-negative Staphylococcus> -empiric Vanc, likely contaminant, await speciation -no clear source of infection  Bacteremia  -Likely contaminant as this was CoNS in one of two sets  Hyperkalemia: Potassium 7.0 with T-wave change. Secondary to DKA and AKi. -Treated with calcium and insulin in emergency room -resolved  DM-II: Last A1c 13.4 on 10/12/14, now Hbaic 14.3, doubt compliance  Psoriasis: -continue home Temovate and Kenalog topically  GERD: -Protonix  AKI: likely due to prerenal secondary to dehydration and continuation of NSAIDs -resolved with IVF -Serum creatinine 0.4 for the day of discharge  Tobacco abuse: -counseled -Nicotine patch   Discharge Condition: stable  Disposition: home  Diet:carb modified Wt Readings from  Last 3 Encounters:  01/22/15 65.318 kg (144 lb)  10/25/14 65.772 kg (145 lb)  10/11/14 57.5 kg (126 lb 12.2 oz)    History of present illness:  39 y.o. male with PMH of uncontrolled diabetes mellitus, GERD, psoriasis, eczema, tobacco abuse, drug abuse, who presents with the nausea, vomiting and fatigue.  Patient reports that since yesterday he started having nausea, vomiting and tiredness. He vomited more than 10 times without blood in the vomitus. No diarrhea. He has mild abdominal pain, no fever or chills. No chest pain or shortness of breath. He states that he skipped his insulin dosage this morning of admission. Marland Kitchen However, given the patient's hemoglobin A1c of 14.3, I doubt that the patient has poor compliance. The patient was started on intravenous fluids and IV insulin. His anion gap closed. The patient was transitioned to subcutaneous insulin. He did have a number of CBGs in the 50-60 range. His subcutaneous Lantus was decreased. His home dose was adjusted. The patient was instructed to check her CBGs before each meal and at bedtime and to keep a glycemic log which she will take to his primary care provider.        Discharge Exam: Filed Vitals:   01/24/15 0413  BP: 121/76  Pulse: 80  Temp: 98 F (36.7 C)  Resp: 18   Filed Vitals:   01/23/15 1308 01/23/15 1734 01/23/15 2043 01/24/15 0413  BP: 128/76 116/87 128/84 121/76  Pulse: 77 76 87 80  Temp: 97.7 F (36.5 C) 98.5 F (36.9 C) 98.5 F (36.9 C) 98 F (36.7 C)  TempSrc: Oral Oral Oral Oral  Resp: 16 18 18 18   Height:  Weight:      SpO2: 100% 100% 100% 100%   General: A&O x 3, NAD, pleasant, cooperative Cardiovascular: RRR, no rub, no gallop, no S3 Respiratory: CTAB, no wheeze, no rhonchi Abdomen:soft, nontender, nondistended, positive bowel sounds Extremities: No edema, No lymphangitis, no petechiae  Discharge Instructions      Discharge Instructions    Diet - low sodium heart healthy    Complete by:  As  directed      Increase activity slowly    Complete by:  As directed             Medication List    STOP taking these medications        ibuprofen 600 MG tablet  Commonly known as:  ADVIL,MOTRIN      TAKE these medications        clobetasol cream 0.05 %  Commonly known as:  TEMOVATE  Apply 1 application topically 2 (two) times daily.     insulin glargine 100 UNIT/ML injection  Commonly known as:  LANTUS  Inject 0.25 mLs (25 Units total) into the skin daily.     Insulin Syringe-Needle U-100 29G X 1/2" 2 ML Misc  Commonly known as:  B-D INS SYRINGE 2CC/29GX1/2"  Check Blood sugar TID and QHS     NOVOLOG 100 UNIT/ML injection  Generic drug:  insulin aspart  Sliding scale CBG 70 - 120: 0 units CBG 121 - 150: 1 unit,  CBG 151 - 200: 2 units,  CBG 201 - 250: 3 units,  CBG 251 - 300: 5 units,  CBG 301 - 350: 7 units,  CBG 351 - 400: 9 units   CBG > 400: 10 units     pantoprazole 40 MG tablet  Commonly known as:  PROTONIX  Take 1 tablet (40 mg total) by mouth daily.     promethazine 12.5 MG tablet  Commonly known as:  PHENERGAN  Take 1 tablet (12.5 mg total) by mouth every 6 (six) hours as needed for nausea or vomiting.     triamcinolone cream 0.1 %  Commonly known as:  KENALOG  Apply 1 application topically 2 (two) times daily.         The results of significant diagnostics from this hospitalization (including imaging, microbiology, ancillary and laboratory) are listed below for reference.    Significant Diagnostic Studies: US Renal  01/22/2015   CLINICAL DATA:  Acute kidney injury. History of diabetes mellitus. Initial encounter.  EXAM: RENAL / URINARY TRACT ULTRASOUND COMPLETE  COMPARISON:  None.  FINDINGS: Right Kidney:  Length: 11.7 cm. There is a small cyst in the interpolar region measuring 1.4 cm maximally. No suspicious finding or hydronephrosis.  Left Kidney:  Length: 11.4 cm. Echogenicity within normal limits. No mass or hydronephrosis visualized.  Bladder:   Appears normal for degree of bladder distention.  IMPRESSION: Small right renal cyst. Otherwise normal renal ultrasound. No hydronephrosis.   Electronically Signed   By: Richardean Sale M.D.   On: 01/22/2015 07:03   Dg Chest Port 1 View  01/22/2015   CLINICAL DATA:  Nausea, vomiting, hyperglycemia.  EXAM: PORTABLE CHEST - 1 VIEW  COMPARISON:  10/10/2014  FINDINGS: The cardiomediastinal contours are normal. The lungs are clear. Pulmonary vasculature is normal. No consolidation, pleural effusion, or pneumothorax. No acute osseous abnormalities are seen. Lateral most lower left ribs excluded from the field of view. Chronic change about the left shoulder.  IMPRESSION: No acute pulmonary process.   Electronically Signed   By: Threasa Beards  Ehinger M.D.   On: 01/22/2015 03:30     Microbiology: Recent Results (from the past 240 hour(s))  Culture, blood (routine x 2)     Status: None (Preliminary result)   Collection Time: 01/22/15  3:29 AM  Result Value Ref Range Status   Specimen Description BLOOD RIGHT ARM  Final   Special Requests BOTTLES DRAWN AEROBIC ONLY 4CC  Final   Culture   Final           BLOOD CULTURE RECEIVED NO GROWTH TO DATE CULTURE WILL BE HELD FOR 5 DAYS BEFORE ISSUING A FINAL NEGATIVE REPORT Performed at [facility]    Report Status PENDING  Incomplete  Culture, blood (routine x 2)     Status: None   Collection Time: 01/22/15  3:41 AM  Result Value Ref Range Status   Specimen Description BLOOD RIGHT HAND  Final   Special Requests BOTTLES DRAWN AEROBIC AND ANAEROBIC 5CC EA  Final   Culture   Final    STAPHYLOCOCCUS SPECIES (COAGULASE NEGATIVE) Note: THE SIGNIFICANCE OF ISOLATING THIS ORGANISM FROM A SINGLE SET OF BLOOD CULTURES WHEN MULTIPLE SETS ARE DRAWN IS UNCERTAIN. PLEASE NOTIFY THE MICROBIOLOGY DEPARTMENT WITHIN ONE WEEK IF SPECIATION AND SENSITIVITIES ARE REQUIRED. Note: Gram Stain Report Called to,Read Back By and Verified With: AVERY DANIEL [id number]@240AM   BJENN Performed at [facility]    Report Status 01/24/2015 FINAL  Final  Urine culture     Status: None   Collection Time: 01/22/15  4:16 AM  Result Value Ref Range Status   Specimen Description URINE, CLEAN CATCH  Final   Special Requests NONE  Final   Colony Count NO GROWTH Performed at [facility]   Final   Culture NO GROWTH Performed at [facility]   Final   Report Status 01/23/2015 FINAL  Final  MRSA PCR Screening     Status: None   Collection Time: 01/22/15  5:42 PM  Result Value Ref Range Status   MRSA by PCR NEGATIVE NEGATIVE Final    Comment:        The GeneXpert MRSA Assay (FDA approved for NASAL specimens only), is one component of a comprehensive MRSA colonization surveillance program. It is not intended to diagnose MRSA infection nor to guide or monitor treatment for MRSA infections.      Labs: Basic Metabolic Panel:  Recent Labs Lab 01/22/15 1802 01/22/15 2142 01/23/15 0127 01/23/15 0851 01/24/15 0704  NA 143 141 139 136 138  K 3.7 4.1 3.4* 2.9* 3.0*  CL 115* 114* 113* 109 108  CO2 18* 19* 19* 19* 24  GLUCOSE 75 102* 140* 117* 59*  BUN 12 12 7  <5* <5*  CREATININE 0.90 0.90 0.73 0.63 0.44*  CALCIUM 8.1* 7.8* 7.7* 7.7* 7.7*   Liver Function Tests:  Recent Labs Lab 01/22/15 0307  AST 43*  ALT 26  ALKPHOS 169*  BILITOT 2.1*  PROT 7.9  ALBUMIN 4.4    Recent Labs Lab 01/22/15 0521  LIPASE 345*   No results for input(s): AMMONIA in the last 168 hours. CBC:  Recent Labs Lab 01/22/15 0307 01/23/15 0127 01/24/15 0704  WBC 23.9* 10.9* 3.4*  NEUTROABS 21.9*  --   --   HGB 14.7 11.6* 10.9*  HCT 44.0 32.4* 30.5*  MCV 98.9 90.5 89.7  PLT 393 211 192   Cardiac Enzymes: No results for input(s): CKTOTAL, CKMB, CKMBINDEX, TROPONINI in the last 168 hours. BNP: Invalid input(s): POCBNP CBG:  Recent Labs Lab 01/23/15 2310  01/24/15 0112 01/24/15 0130 01/24/15 0750 01/24/15 0815  GLUCAP 92 62* 92 51*  111*    Time coordinating discharge:  Greater than 30 minutes  Signed:  Madisynn Plair, DO [facility] Pager: [phone] 01/24/2015, 11:06 AM

## 2015-01-24 NOTE — Progress Notes (Signed)
Nsg Discharge Note  Admit Date:  01/22/2015 Discharge date: 01/24/2015   Lenorris Esmond Harps to be D/C'd Home per MD order.  AVS completed.  Copy for chart, and copy for patient signed, and dated. Patient/caregiver able to verbalize understanding.  Discharge Medication:   Medication List    STOP taking these medications        ibuprofen 600 MG tablet  Commonly known as:  ADVIL,MOTRIN      TAKE these medications        clobetasol cream 0.05 %  Commonly known as:  TEMOVATE  Apply 1 application topically 2 (two) times daily.     insulin glargine 100 UNIT/ML injection  Commonly known as:  LANTUS  Inject 0.25 mLs (25 Units total) into the skin daily.     Insulin Syringe-Needle U-100 29G X 1/2" 2 ML Misc  Commonly known as:  B-D INS SYRINGE 2CC/29GX1/2"  Check Blood sugar TID and QHS     NOVOLOG 100 UNIT/ML injection  Generic drug:  insulin aspart  Sliding scale CBG 70 - 120: 0 units CBG 121 - 150: 1 unit,  CBG 151 - 200: 2 units,  CBG 201 - 250: 3 units,  CBG 251 - 300: 5 units,  CBG 301 - 350: 7 units,  CBG 351 - 400: 9 units   CBG > 400: 10 units     pantoprazole 40 MG tablet  Commonly known as:  PROTONIX  Take 1 tablet (40 mg total) by mouth daily.     promethazine 12.5 MG tablet  Commonly known as:  PHENERGAN  Take 1 tablet (12.5 mg total) by mouth every 6 (six) hours as needed for nausea or vomiting.     triamcinolone cream 0.1 %  Commonly known as:  KENALOG  Apply 1 application topically 2 (two) times daily.        Discharge Assessment: Filed Vitals:   01/24/15 1406  BP: 110/76  Pulse: 76  Temp: 98.3 F (36.8 C)  Resp: 16   Skin has ezcema and psoriasis.  IV catheter discontinued intact. Site without signs and symptoms of complications - no redness or edema noted at insertion site, patient denies c/o pain - only slight tenderness at site.  Dressing with slight pressure applied.  D/c Instructions-Education: Discharge instructions given to patient/family with  verbalized understanding. D/c education completed with patient/family including follow up instructions, medication list, d/c activities limitations if indicated, with other d/c instructions as indicated by MD - patient able to verbalize understanding, all questions fully answered. Patient instructed to return to ED, call 911, or call MD for any changes in condition.  Patient escorted via [place], and D/C home via private auto.  Else Habermann [name], RN 01/24/2015 6:38 PM

## 2015-01-25 ENCOUNTER — Telehealth: Payer: Self-pay

## 2015-01-25 NOTE — Telephone Encounter (Signed)
Nurse looked at case manager's note, called patient at [phone], reached voicemail. Left message for patient to call [place] How[place] at [phone].

## 2015-01-25 NOTE — Telephone Encounter (Signed)
Nurse called patient's number listed on chart, reached automated recording stating, "The number or code you have dialed is incorrect, please check the number or code and dial again".

## 2015-01-25 NOTE — Telephone Encounter (Signed)
Transitional Care Clinic Post-discharge Follow-Up Phone Call:  Date of Discharge: 01/24/15 Principal Discharge Diagnosis(es):  DKA, SIRS, hyperkalemia, acute kidney insufficiency Call Completed: Yes                  With Whom: Patient   Please check all that apply:  X  Patient is knowledgeable of his/her condition(s) and/or treatment. X  Patient is caring for self at home.  ? Patient is receiving assist at home from family and/or caregiver. Family and/or caregiver is knowledgeable of patient's condition(s) and/or treatment. ? Patient is receiving home health services. If so, name of agency.     Medication Reconciliation:  X  Medication list reviewed with patient. X  Patient obtained all discharge medications. Medication list reviewed with patient. Patient indicates he has all medications. He also indicates he has all needed diabetes supplies; he states he was out of lancets and went to [facility] yesterday to get needed lancets.   Activities of Daily Living:  X  Independent ? Needs assist  ? Total Care    Community resources in place for patient:  X  None  ? Home Health/Home DME ? Assisted Living ? Support Group          Patient Education:  Patient indicates he has been doing well since discharge. He indicates his most recent blood glucose reading was 132. This was taken two hours after eating. He indicates he cannot remember what his blood glucose was prior to eating but he covered it with 2 U of Novolog.  Discussed patient's sliding scale orders and Lantus orders.  Patient verbalized understanding.  Also asked patient to keep a blood glucose log and bring it to appointment on 01/29/15. Patient verbalized understanding and indicates he will begin keeping a log of his blood glucose and the insulin administered. Patient also reminded of appointment on 01/29/15 at 1030. Patient verbalized understanding and indicates he will have a ride to his appointment. No other needs identified.

## 2015-01-28 LAB — CULTURE, BLOOD (ROUTINE X 2): CULTURE: NO GROWTH

## 2015-01-29 ENCOUNTER — Ambulatory Visit: Payer: Medicaid Other | Attending: Family Medicine | Admitting: Family Medicine

## 2015-01-29 ENCOUNTER — Encounter: Payer: Self-pay | Admitting: Family Medicine

## 2015-01-29 VITALS — BP 112/78 | HR 90 | Temp 98.1°F | Resp 18 | Ht 70.0 in | Wt 141.0 lb

## 2015-01-29 DIAGNOSIS — A419 Sepsis, unspecified organism: Secondary | ICD-10-CM | POA: Diagnosis not present

## 2015-01-29 DIAGNOSIS — L409 Psoriasis, unspecified: Secondary | ICD-10-CM | POA: Diagnosis not present

## 2015-01-29 DIAGNOSIS — E876 Hypokalemia: Secondary | ICD-10-CM

## 2015-01-29 DIAGNOSIS — L309 Dermatitis, unspecified: Secondary | ICD-10-CM | POA: Diagnosis not present

## 2015-01-29 DIAGNOSIS — E1010 Type 1 diabetes mellitus with ketoacidosis without coma: Secondary | ICD-10-CM | POA: Diagnosis not present

## 2015-01-29 DIAGNOSIS — Z794 Long term (current) use of insulin: Secondary | ICD-10-CM | POA: Diagnosis not present

## 2015-01-29 DIAGNOSIS — K219 Gastro-esophageal reflux disease without esophagitis: Secondary | ICD-10-CM

## 2015-01-29 DIAGNOSIS — E109 Type 1 diabetes mellitus without complications: Secondary | ICD-10-CM | POA: Insufficient documentation

## 2015-01-29 DIAGNOSIS — E1029 Type 1 diabetes mellitus with other diabetic kidney complication: Secondary | ICD-10-CM | POA: Diagnosis not present

## 2015-01-29 DIAGNOSIS — N179 Acute kidney failure, unspecified: Secondary | ICD-10-CM | POA: Diagnosis not present

## 2015-01-29 LAB — GLUCOSE, POCT (MANUAL RESULT ENTRY): POC Glucose: 89 mg/dl (ref 70–99)

## 2015-01-29 MED ORDER — LISINOPRIL 2.5 MG PO TABS: 5.0000 mg | ORAL_TABLET | Freq: Every day | ORAL | Status: DC

## 2015-01-29 MED ORDER — TRIAMCINOLONE ACETONIDE 0.1 % EX CREA: 1.0000 "application " | TOPICAL_CREAM | Freq: Two times a day (BID) | CUTANEOUS | Status: DC

## 2015-01-29 MED ORDER — PANTOPRAZOLE SODIUM 40 MG PO TBEC: 40.0000 mg | DELAYED_RELEASE_TABLET | Freq: Every day | ORAL | Status: DC

## 2015-01-29 MED ORDER — INSULIN GLARGINE 100 UNIT/ML ~~LOC~~ SOLN: 25.0000 [IU] | Freq: Every day | SUBCUTANEOUS | Status: DC

## 2015-01-29 NOTE — Patient Instructions (Signed)
Type 1 Diabetes Mellitus Type 1 diabetes mellitus, often simply referred to as diabetes, is a long-term (chronic) disease. It occurs when the islet cells in the pancreas that make insulin (a hormone) are destroyed and can no longer make insulin. Insulin is needed to move sugars from food into the tissue cells. The tissue cells use the sugars for energy. In people with type 1 diabetes, the sugars build up in the blood instead of going into the tissue cells. As a result, high blood sugar (hyperglycemia) develops. Without insulin, the body breaks down fat cells for the needed energy. This breakdown of fat cells produces acid chemicals (ketones), which increases the acid levels in the body. The effect of either high ketone or high sugar (glucose) levels can be life-threatening.  Type 1 diabetes was also previously called juvenile diabetes. It most often occurs before the age of 30, but it can occur at any age. RISK FACTORS A person is predisposed to developing type 1 diabetes if someone in his or her family has the disease and is exposed to certain additional environmental triggers.  SYMPTOMS  Symptoms of type 1 diabetes may develop gradually over days to weeks or suddenly. The symptoms occur due to hyperglycemia. The symptoms can include:   Increased thirst (polydipsia).  Increased urination (polyuria).  Increased urination during the night (nocturia).  Weight loss. This weight loss may be rapid.  Frequent, recurring infections.  Tiredness (fatigue).  Weakness.  Vision changes, such as blurred vision.  Fruity smell to your breath.  Abdominal pain.  Nausea or vomiting. DIAGNOSIS  Type 1 diabetes is diagnosed when symptoms of diabetes are present and when blood glucose levels are increased. Your blood glucose level may be checked by one or more of the following blood tests:  A fasting blood glucose test. You will not be allowed to eat for at least 8 hours before a blood sample is  taken.  A random blood glucose test. Your blood glucose is checked at any time of the day regardless of when you ate.  A hemoglobin A1c blood glucose test. A hemoglobin A1c test provides information about blood glucose control over the previous 3 months. TREATMENT  Although type 1 diabetes cannot be prevented, it can be managed with insulin, diet, and exercise.  You will need to take insulin daily to keep blood glucose in the desired range.  You will need to match insulin dosing with exercise and healthy food choices. The treatment goal is to maintain the before-meal blood sugar (preprandial glucose) level at 70-130 mg/dL.  HOME CARE INSTRUCTIONS   Have your hemoglobin A1c level checked twice a year.  Perform daily blood glucose monitoring as directed by your health care provider.  Monitor urine ketones when you are ill and as directed by your health care provider.  Take your insulin as directed by your health care provider to maintain your blood glucose level in the desired range.  Never run out of insulin. It is needed every day.  Adjust insulin based on your intake of carbohydrates. Carbohydrates can raise blood glucose levels but need to be included in your diet. Carbohydrates provide vitamins, minerals, and fiber, which are an essential part of a healthy diet. Carbohydrates are found in fruits, vegetables, whole grains, dairy products, legumes, and foods containing added sugars.  Eat healthy foods. Alternate 3 meals with 3 snacks.  Maintain a healthy weight.  Carry a medical alert card or wear your medical alert jewelry.  Carry a 15-gram carbohydrate snack   with you at all times to treat low blood glucose (hypoglycemia). Some examples of 15-gram carbohydrate snacks include:  Glucose tablets, 3 or 4.  Glucose gel, 15-gram tube.  Raisins, 2 tablespoons (24 grams).  Jelly beans, 6.  Animal crackers, 8.  Fruit juice, regular soda, or low-fat milk, 4 ounces (120  mL).  Gummy treats, 9.  Recognize hypoglycemia. Hypoglycemia occurs with blood glucose levels of 70 mg/dL and below. The risk for hypoglycemia increases when fasting or skipping meals, during or after intense exercise, and during sleep. Hypoglycemia symptoms can include:  Tremors or shakes.  Decreased ability to concentrate.  Sweating.  Increased heart rate.  Headache.  Dry mouth.  Hunger.  Irritability.  Anxiety.  Restless sleep.  Altered speech or coordination.  Confusion.  Treat hypoglycemia promptly. If you are alert and able to safely swallow, follow the 15:15 rule:  Take 15-20 grams of rapid-acting glucose or carbohydrate. Rapid-acting options include glucose gel, glucose tablets, or 4 ounces (120 mL) of fruit juice, regular soda, or low-fat milk.  Check your blood glucose level 15 minutes after taking the glucose.  Take 15-20 grams more of glucose if the repeat blood glucose level is still 70 mg/dL or below.  Eat a meal or snack within 1 hour once blood glucose levels return to normal.  Be alert to polyuria and polydipsia, which are early signs of hyperglycemia. An early awareness of hyperglycemia allows for prompt treatment. Treat hyperglycemia as directed by your health care provider.  Exercise regularly as directed by your health care provider. This includes:  Performing resistance training twice a week such as push-ups, sit-ups, lifting weights, or using resistance bands.  Performing 150 minutes of cardio exercises each week such as walking, running, or playing sports.  Staying active and spending no more than 90 minutes at one time being inactive.  Adjust your insulin dosing and food intake as needed if you start a new exercise or sport.  Follow your sick-day plan at any time you are unable to eat or drink as usual.   Do not use any tobacco products including cigarettes, chewing tobacco, or electronic cigarettes. If you need help quitting, ask your  health care provider.  Limit alcohol intake to no more than 1 drink per day for nonpregnant women and 2 drinks per day for men. You should drink alcohol only when you are also eating food. Talk with your health care provider about whether alcohol is safe for you. Tell your health care provider if you drink alcohol several times a week.  Keep all follow-up visits as directed by your health care provider.  Schedule an eye exam within 5 years of diagnosis and then annually.  Perform daily skin and foot care. Examine your skin and feet daily for cuts, bruises, redness, nail problems, bleeding, blisters, or sores. A foot exam by a health care provider should be done annually.  Brush your teeth and gums at least twice a day and floss at least once a day. Follow up with your dentist regularly.  Share your diabetes management plan with your workplace or school.  Stay up-to-date with immunizations. It is recommended that people with diabetes who are over 42 years old get the pneumonia vaccine. In some cases, two separate shots may be given. Ask your health care provider if your pneumonia vaccination is up-to-date.  Learn to manage stress.  Obtain ongoing diabetes education and support as needed.  Participate in or seek rehabilitation as needed to maintain or improve independence  and quality of life. Request a physical or occupational therapy referral if you are having foot or hand numbness, or difficulties with grooming, dressing, eating, or physical activity. SEEK MEDICAL CARE IF:   You are unable to eat food or drink fluids for more than 6 hours.  You have nausea and vomiting for more than 6 hours.  Your blood glucose level is over 240 mg/dL.  There is a change in mental status.  You develop an additional serious illness.  You have diarrhea for more than 6 hours.  You have been sick or have had a fever for a couple of days and are not getting better.  You have pain during any physical  activity. SEEK IMMEDIATE MEDICAL CARE IF:  You have difficulty breathing.  You have moderate to large ketone levels. MAKE SURE YOU:  Understand these instructions.  Will watch your condition.  Will get help right away if you are not doing well or get worse. Document Released: 08/01/2000 Document Revised: 12/19/2013 Document Reviewed: 03/02/2012 [employer] Patient Information 2015 [employer]. This information is not intended to replace advice given to you by your health care provider. Make sure you discuss any questions you have with your health care provider.  

## 2015-01-29 NOTE — Progress Notes (Signed)
Patient here today for hospital follow up for DKA.  Patient states he is feeling "fine."  Patient has no complaints of pain at this time. Patient reports he was "in a rush" this morning and forgot to bring his blood sugar log.  Patient states sugars "range from 70-150." Patient requests refills on Kenalog cream and Lantus.   Patient current CBG: 43.   Patient ready to quit smoking but does not want to become "dependent on Nicotine patch." BP: 112/78, HR: 90.

## 2015-01-29 NOTE — Progress Notes (Signed)
Quick Note:  Labs addressed at office as well as medications and patient was made aware. ______ 

## 2015-01-29 NOTE — Progress Notes (Signed)
Subjective:    Patient ID: Jared Murray, male    DOB: [birth year], 39 y.o.   MRN: [id number]   [place] date: 01/22/2015 Discharge date: 01/24/2015  Date of telephone encounter: 01/24/2015  PCP: Lorayne Marek, MD  HPI  Jared Murray is a 39 year old male with a history of uncontrolled type 1 diabetes mellitus and previous ICU admission for DKA in 09/2014,  gastroesophageal reflux disease, psoriasis, eczema, drug abuse ,who had presented to the emergency room with nausea or vomiting and fatigue.   He had also complained of abdominal pain and his vomitus was negative for blood. On presentation he was found to have an anion gap of 37 and bicarbonate of 8 and was diagnosed with diabetic ketoacidosis and placed on IV fluids and subcutaneous insulin with resulting dropping of his blood sugars to the 50-60 range. He initially had hyperkalemia of 7 which was treated with insulin and calcium and was thought to be secondary to acute kidney injury and DKA but then he subsequently developed  Hypokalemia of 2.9 and had repletion of his potassium. He was also managed for acute kidney as he had a creatinine of 2.42 which trended down to 0.44 on discharge. Prior to this hospitalization he was also admitted between 2/23 and 2/25 for DKA and acute kidney injury and was managed in the intensive care unit.  He was managed for presumptive SIRS , one of his two blood cultures grew Coagulase negative staph and so he was placed on empiric vancomycin but there was no clear source of infection and this was thought to be a likely contaminant. His Lantus dose was decreased prior to discharge and he was also seen by the diabetes coordinator.   Interval History: He has a nutritionist from [facility] who comes to the house to perform dietary education but he admits to not eating right and consuming a lot of carbs even though he is aware of what he should and should not be eating. Has not acute symptoms  at this time.  Healthcare needs: He is independent and is capable of administering his medications himself.  Past Medical History  Diagnosis Date  . Eczema   . Type 1 diabetes mellitus   . Psoriasis   . GERD (gastroesophageal reflux disease)     Past Surgical History  Procedure Laterality Date  . Finger surgery     History   Social History  . Marital Status: Married    Spouse Name: N/A  . Number of Children: N/A  . Years of Education: N/A   Occupational History  . Not on file.   Social History Main Topics  . Smoking status: Current Some Day Smoker    Types: Cigarettes  . Smokeless tobacco: Not on file     Comment: Smoking 5 cigs/week  . Alcohol Use: 0.0 oz/week    0 Standard drinks or equivalent per week     Comment: recently quit - 7/14   . Drug Use: Yes    Special: Marijuana     Comment: "it's been a while"  . Sexual Activity: Not on file   Other Topics Concern  . 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 Penicillins     Childhood allergy     Current Outpatient Prescriptions on File Prior to Visit  Medication Sig Dispense Refill  .  clobetasol cream (TEMOVATE) 5.63 % Apply 1 application topically 2 (two) times daily. 30 g 2  . Insulin Syringe-Needle U-100 (B-D INS SYRINGE 2CC/29GX1/2") 29G X 1/2" 2 ML MISC Check Blood sugar TID and QHS 100 each 12  . NOVOLOG 100 UNIT/ML injection Sliding scale CBG 70 - 120: 0 units CBG 121 - 150: 1 unit,  CBG 151 - 200: 2 units,  CBG 201 - 250: 3 units,  CBG 251 - 300: 5 units,  CBG 301 - 350: 7 units,  CBG 351 - 400: 9 units   CBG > 400: 10 units (Patient taking differently: Inject 1-10 Units into the skin 3 (three) times daily with meals. Sliding scale CBG 70 - 120: 0 units CBG 121 - 150: 1 unit,  CBG 151 - 200: 2 units,  CBG 201 - 250: 3 units,  CBG 251 - 300: 5 units,  CBG 301 - 350: 7 units,  CBG 351 - 400: 9 units   CBG > 400:  10 units) 2 vial 12  . promethazine (PHENERGAN) 12.5 MG tablet Take 1 tablet (12.5 mg total) by mouth every 6 (six) hours as needed for nausea or vomiting. 30 tablet 0   No current facility-administered medications on file prior to visit.        Review of Systems General: negative for fever, weight loss, appetite change Eyes: no visual symptoms. ENT: no ear symptoms, no sinus tenderness, no nasal congestion or sore throat. Neck: no pain  Respiratory: no wheezing, shortness of breath, cough Cardiovascular: no chest pain, no dyspnea on exertion, no pedal edema, no orthopnea. Gastrointestinal: no abdominal pain, no diarrhea, no constipation Genito-Urinary: no urinary frequency, no dysuria, no polyuria. Hematologic: no bruising Endocrine: no cold or heat intolerance Neurological: no headaches, no seizures, no tremors Musculoskeletal: no joint pains, no joint swelling Skin: Positive for skin lesions. Psychological: no depression, no anxiety,        Objective: Filed Vitals:   01/29/15 1022  BP: 112/78  Pulse: 90  Temp: 98.1 F (36.7 C)  TempSrc: Oral  Resp: 18  Height: 5\' 10"  (1.778 m)  Weight: 141 lb (63.957 kg)  SpO2: 99%      Physical Exam  Constitutional: normal appearing,  Eyes: PERRLA HEENT: Head is atraumatic, normal sinuses, normal oropharynx, normal appearing tonsils and palate, tympanic membrane is normal bilaterally. Neck: normal range of motion, no thyromegaly, no JVD Cardiovascular: normal rate and rhythm, normal heart sounds, no murmurs, rub or gallop, no pedal edema Respiratory: clear to auscultation bilaterally, no wheezes, no rales, no rhonchi Abdomen: soft, not tender to palpation, normal bowel sounds, no enlarged organs Extremities: Full ROM, no tenderness in joints Skin: Psoriatic lesions on bilateral elbow with mild epididymal ulceration but no bleeding. Eczema does skin changes on the neck which are hyperpigmented.  Neurological: alert, oriented x3,  cranial nerves I-XII grossly intact , normal motor strength, normal sensation. Psychological: normal mood.   CMP Latest Ref Rng 01/24/2015 01/23/2015 01/23/2015  Glucose 65 - 99 mg/dL 59(L) 117(H) 140(H)  BUN 6 - 20 mg/dL <5(L) <5(L) 7  Creatinine 0.61 - 1.24 mg/dL 0.44(L) 0.63 0.73  Sodium 135 - 145 mmol/L 138 136 139  Potassium 3.5 - 5.1 mmol/L 3.0(L) 2.9(L) 3.4(L)  Chloride 101 - 111 mmol/L 108 109 113(H)  CO2 22 - 32 mmol/L 24 19(L) 19(L)  Calcium 8.9 - 10.3 mg/dL 7.7(L) 7.7(L) 7.7(L)  Total Protein 6.5 - 8.1 g/dL - - -  Total Bilirubin 0.3 - 1.2 mg/dL - - -  Alkaline Phos 38 - 126 U/L - - -  AST 15 - 41 U/L - - -  ALT 17 - 63 U/L - - -          Assessment & Plan:  39 year old male with a history of gastroesophageal reflux disease, psoriasis, eczema, drug abuse , acute kidney injury, uncontrolled type 1 diabetes mellitus with A1c of 14.3 and multiple hospitalizations for diabetic ketoacidosis secondary to noncompliance.  Diabetic ketoacidosis: Poor compliance with dietary regimen is largely contributory to his multiple hospitalizations for diabetic ketoacidosis. I have discussed with him consequences of his actions and he has admitted to making changes.  Type 1 diabetes mellitus: Evidenced by elevated GAD ab of 7.1 and C-peptide <0.10 from 02/2013 Uncontrolled with A1c of 14.3. Blood sugar fluctuates and so his regimen will have to be adjusted carefully. At this time I am making no changes but suggesting he work on a strict ADA diet as his blood sugar is 89 in the clinic today. Blood sugar log will be reviewed at next visit. Low-dose ACE inhibitor added to regimen our recheck now function at next visit.  Psoriasis: Uncontrolled. Continues to use Kenalog cream; skin care discussed.  Sepsis: Resolved  Hypokalemia: Potassium at discharge was 3.0 The addition of ace inhibitor will most likely replete his potassium which will be repeated at his next visit.  GERD: Controlled  on PPI  Acute Kidney injury: Resolved.

## 2015-01-30 LAB — BASIC METABOLIC PANEL
BUN: 6 mg/dL (ref 6–23)
CALCIUM: 9.4 mg/dL (ref 8.4–10.5)
CO2: 29 meq/L (ref 19–32)
Chloride: 101 mEq/L (ref 96–112)
Creat: 0.7 mg/dL (ref 0.50–1.35)
Glucose, Bld: 59 mg/dL — ABNORMAL LOW (ref 70–99)
Potassium: 4.7 mEq/L (ref 3.5–5.3)
Sodium: 140 mEq/L (ref 135–145)

## 2015-01-30 NOTE — Progress Notes (Signed)
Quick Note:  Please inform the patient that labs are normal. Thank you. ______

## 2015-01-30 NOTE — Telephone Encounter (Signed)
-----   Message from [name], MD sent at 01/30/2015  8:30 AM EDT ----- Please inform the patient that labs are normal. Thank you.

## 2015-01-30 NOTE — Telephone Encounter (Signed)
NUrse called patient, reached automated voicemail explaining "The number or code you have dialed is incorrect, please check the number and try again". Nurse called patient at [phone], reached voicemail. Left message for patient to call Jacinta Penalver at [phone].

## 2015-01-31 ENCOUNTER — Inpatient Hospital Stay: Payer: Medicaid Other | Admitting: Internal Medicine

## 2015-01-31 NOTE — Telephone Encounter (Signed)
Nurse called patient, reached voicemail. Left message for patient to call Jaylena Holloway at [phone].   

## 2015-01-31 NOTE — Telephone Encounter (Signed)
-----   Message from [name], MD sent at 01/30/2015  8:30 AM EDT ----- Please inform the patient that labs are normal. Thank you.

## 2015-02-01 NOTE — Telephone Encounter (Signed)
Nurse called patient, patient verified date of birth. Patient aware of normal labs and has no questions at this time.

## 2015-02-01 NOTE — Telephone Encounter (Signed)
-----   Message from [name], MD sent at 01/30/2015  8:30 AM EDT ----- Please inform the patient that labs are normal. Thank you.

## 2015-02-09 ENCOUNTER — Telehealth: Payer: Self-pay

## 2015-02-09 NOTE — Telephone Encounter (Signed)
Call placed to patient to check on medical status/condition and to remind him of [facility] follow-up appointment on 02/12/15 at 1030 with Dr. Jarold Song.  Unable to reach patient at [phone] or [phone]; left voicemails at both numbers requesting return call.

## 2015-02-12 ENCOUNTER — Telehealth: Payer: Self-pay

## 2015-02-12 ENCOUNTER — Encounter: Payer: Self-pay | Admitting: Family Medicine

## 2015-02-12 ENCOUNTER — Ambulatory Visit: Payer: Medicaid Other | Attending: Family Medicine | Admitting: Family Medicine

## 2015-02-12 VITALS — BP 112/78 | HR 100 | Temp 98.1°F | Resp 18 | Ht 70.0 in | Wt 135.2 lb

## 2015-02-12 DIAGNOSIS — Z9119 Patient's noncompliance with other medical treatment and regimen: Secondary | ICD-10-CM | POA: Insufficient documentation

## 2015-02-12 DIAGNOSIS — E1052 Type 1 diabetes mellitus with diabetic peripheral angiopathy with gangrene: Secondary | ICD-10-CM | POA: Diagnosis not present

## 2015-02-12 DIAGNOSIS — L409 Psoriasis, unspecified: Secondary | ICD-10-CM | POA: Insufficient documentation

## 2015-02-12 DIAGNOSIS — M8700 Idiopathic aseptic necrosis of unspecified bone: Secondary | ICD-10-CM

## 2015-02-12 DIAGNOSIS — Z794 Long term (current) use of insulin: Secondary | ICD-10-CM | POA: Diagnosis not present

## 2015-02-12 DIAGNOSIS — E1065 Type 1 diabetes mellitus with hyperglycemia: Secondary | ICD-10-CM | POA: Diagnosis not present

## 2015-02-12 DIAGNOSIS — F17210 Nicotine dependence, cigarettes, uncomplicated: Secondary | ICD-10-CM | POA: Insufficient documentation

## 2015-02-12 DIAGNOSIS — E10649 Type 1 diabetes mellitus with hypoglycemia without coma: Secondary | ICD-10-CM | POA: Diagnosis not present

## 2015-02-12 DIAGNOSIS — Z79899 Other long term (current) drug therapy: Secondary | ICD-10-CM | POA: Insufficient documentation

## 2015-02-12 DIAGNOSIS — M879 Osteonecrosis, unspecified: Secondary | ICD-10-CM | POA: Diagnosis not present

## 2015-02-12 DIAGNOSIS — K219 Gastro-esophageal reflux disease without esophagitis: Secondary | ICD-10-CM

## 2015-02-12 HISTORY — DX: Idiopathic aseptic necrosis of unspecified bone: M87.00

## 2015-02-12 LAB — GLUCOSE, POCT (MANUAL RESULT ENTRY): POC GLUCOSE: 248 mg/dL — AB (ref 70–99)

## 2015-02-12 MED ORDER — TRAMADOL HCL 50 MG PO TABS: 50.0000 mg | ORAL_TABLET | Freq: Three times a day (TID) | ORAL | Status: DC | PRN

## 2015-02-12 NOTE — Progress Notes (Signed)
Subjective:    Patient ID: Jared Murray, male    DOB: [birth year], 39 y.o.   MRN: [id number]   [place] date: 01/22/2015 Discharge date: 01/24/2015  Date of telephone encounter: 01/24/2015  PCP: Lorayne Marek, MD  HPI  Jared Murray is a 39 year old male with a history of uncontrolled type 1 diabetes mellitus (hba1c 14.3 in 01/2015) and previous ICU admission for DKA in 09/2014,  gastroesophageal reflux disease, psoriasis, eczema, drug abuse ,who was seen at the transitional care clinic 2 weeks ago after a hospitalization for diabetic ketoacidosis secondary to noncompliance, sepsis and acute renal  Injury.   He had resolved to be more compliant with his medications and so at his last visit no regimen changes were made. He reports his blood sugars have been in the 120-190 range random and he has also had a hypoglycemic value of 48 which occurred after he had delayed his dinner for couple of hours.  Complains of hip pain which is bilateral and is worse after he cuts the grass  Or does some exercise ; while getting out of the car he feels a sharp pain and has to take his time while swinging his legs across. He states he was told he would need a hip replacement. Hip x-ray from 04/2014 revealed findings compatible with advanced bilateral avascular necrosis subchondral collapse and marked degenerative changes of the bilateral hip.   Past Medical History  Diagnosis Date  . Eczema   . Type 1 diabetes mellitus   . Psoriasis   . GERD (gastroesophageal reflux disease)     Past Surgical History  Procedure Laterality Date  . Finger surgery      Family History  Problem Relation Age of Onset  . Hypertension Father   . Diabetes      multiple  . Lupus Cousin   . Stroke Maternal Grandmother   . Stroke Paternal Grandmother     History   Social History  . Marital Status: Married    Spouse Name: N/A  . Number of Children: N/A  . Years of Education: N/A    Occupational History  . Not on file.   Social History Main Topics  . Smoking status: Current Some Day Smoker    Types: Cigarettes  . Smokeless tobacco: Not on file     Comment: Smoking 5 cigs/week  . Alcohol Use: 0.0 oz/week    0 Standard drinks or equivalent per week     Comment: recently quit - 7/14   . Drug Use: Yes    Special: Marijuana     Comment: "it's been a while"  . Sexual Activity: Not on file   Other Topics Concern  . 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 Penicillins     Childhood allergy    Current Outpatient Prescriptions on File Prior to Visit  Medication Sig Dispense Refill  . clobetasol cream (TEMOVATE) 4.17 % Apply 1 application topically 2 (two) times daily. 30 g 2  . insulin glargine (LANTUS) 100 UNIT/ML injection Inject 0.25 mLs (25 Units total) into the skin daily. 10 mL 2  . Insulin Syringe-Needle U-100 (B-D INS SYRINGE 2CC/29GX1/2") 29G X 1/2" 2 ML MISC Check Blood sugar TID and QHS 100 each 12  . lisinopril (PRINIVIL,ZESTRIL) 2.5 MG tablet Take 2 tablets (5 mg total) by mouth daily.  30 tablet 2  . NOVOLOG 100 UNIT/ML injection Sliding scale CBG 70 - 120: 0 units CBG 121 - 150: 1 unit,  CBG 151 - 200: 2 units,  CBG 201 - 250: 3 units,  CBG 251 - 300: 5 units,  CBG 301 - 350: 7 units,  CBG 351 - 400: 9 units   CBG > 400: 10 units (Patient taking differently: Inject 1-10 Units into the skin 3 (three) times daily with meals. Sliding scale CBG 70 - 120: 0 units CBG 121 - 150: 1 unit,  CBG 151 - 200: 2 units,  CBG 201 - 250: 3 units,  CBG 251 - 300: 5 units,  CBG 301 - 350: 7 units,  CBG 351 - 400: 9 units   CBG > 400: 10 units) 2 vial 12  . pantoprazole (PROTONIX) 40 MG tablet Take 1 tablet (40 mg total) by mouth daily. 30 tablet 2  . promethazine (PHENERGAN) 12.5 MG tablet Take 1 tablet (12.5 mg total) by mouth every 6 (six) hours as needed for nausea or  vomiting. 30 tablet 0  . triamcinolone cream (KENALOG) 0.1 % Apply 1 application topically 2 (two) times daily. 30 g 2   No current facility-administered medications on file prior to visit.     Review of Systems  General: negative for fever, weight loss, appetite change Eyes: no visual symptoms. ENT: no ear symptoms, no sinus tenderness, no nasal congestion or sore throat. Neck: no pain  Respiratory: no wheezing, shortness of breath, cough Cardiovascular: no chest pain, no dyspnea on exertion, no pedal edema, no orthopnea. Gastrointestinal: no abdominal pain, no diarrhea, no constipation Genito-Urinary: no urinary frequency, no dysuria, no polyuria. Hematologic: no bruising Endocrine: no cold or heat intolerance Neurological: no headaches, no seizures, no tremors Musculoskeletal: see hpi     Objective: Filed Vitals:   02/12/15 1020  BP: 112/78  Pulse: 100  Temp: 98.1 F (36.7 C)  TempSrc: Oral  Resp: 18  Height: 5\' 10"  (1.778 m)  Weight: 135 lb 3.2 oz (61.326 kg)  SpO2: 100%      Physical Exam  Constitutional: normal appearing,  Eyes: PERRLA HEENT: Head is atraumatic, normal sinuses, normal oropharynx, normal appearing tonsils and palate, tympanic membrane is normal bilaterally. Neck: normal range of motion, no thyromegaly, no JVD Cardiovascular: normal rate and rhythm, normal heart sounds, no murmurs, rub or gallop, no pedal edema Respiratory: clear to auscultation bilaterally, no wheezes, no rales, no rhonchi Abdomen: soft, not tender to palpation, normal bowel sounds, no enlarged organs Extremities: tenderness in bilateral hips on ROM   EXAM: LEFT HIP - COMPLETE 2+ VIEW  COMPARISON: None.  FINDINGS: There is marked deformity of the left femoral head with subchondral collapse, subchondral sclerosis and left hip joint space narrowing. Additionally there is subchondral sclerosis and cystic change involving the left acetabulum. No evidence for acute fracture  or dislocation.  Similar findings involving the right hip joint. SI joints are unremarkable. Lower lumbar spine unremarkable.  IMPRESSION: Findings most compatible with advanced bilateral avascular necrosis with subchondral collapse and marked degenerative change of the bilateral hips.   Electronically Signed  By: Lovey Newcomer M.D.  On: 04/21/2014 10:35      Assessment & Plan:  39 year old male with a history of GERD, Psoriasis, Eczema, uncontrolled type 1 diabetes mellitus (hba1c 14.3 in 01/2015) and multiple hospitalizations for DKA secondary to non compliance.  Type 1 DM: Uncontrolled with A1c of 14.3, CBG 248 Based on home blood sugar logs  which reveal some improvement I will not make any changes to his regimen at this time. Advised to schedule annual eye exam, up to date on Pneumovax, foot exam performed today.  Avascular necrosis of both hips: I have discussed referring him to orthopedics but he states he cannot afford the surgery at this time I would love to hold off until his disability comes to. In the meantime I am placing him on tramadol as needed for pain.  Psoriasis: Continue topical steroid.  GERD: Controlled on PPI  This note has been created with Surveyor, quantity. Any transcriptional errors are unintentional.

## 2015-02-12 NOTE — Telephone Encounter (Signed)
Called patient to check on his status and remind him of his appointment with Dr Jarold Song today at 1030.  Voice mail messages left on  # [phone] and  # [phone] requesting call back.

## 2015-02-12 NOTE — Patient Instructions (Signed)
Avascular Necrosis  Avascular necrosis is a disease resulting from the temporary or permanent loss of the blood supply to the bones. Without blood, the bone tissue dies and causes the bone to become soft. If the process involves the bone near a joint, it may lead to collapse of the joint surface. This disease is also known as:   Osteonecrosis.   Aseptic necrosis.   Ischemic bone necrosis.  Avascular necrosis most commonly affects the ends (epiphysis) of long bones. The femur, the bone extending from the knee joint to the hip joint, is the bone most commonly involved. The disease may affect 1 bone, more than 1 bone at the same time, more than 1 bone at different times. It affects men and women equally. Avascular necrosis occurs at any age. But it is more common between the ages of 30 and 50 years.  SYMPTOMS   In early stages patients may not have any symptoms. But as the disease progresses, joint pain generally develops. At first there is pain when putting weight on the affected joint, and then when resting. Pain usually develops gradually. It may be mild or severe. As the disease progresses and the bone and surrounding joint surface collapses, pain may develop or increase dramatically. Pain may be severe enough to limit range of motion in the affected joint. The period of time between the first symptoms and loss of joint function is different for each patient. This can range from several months to more than a year. Disability depends on:   What part of the bone is affected.   How large an area is involved.   How effectively the bone repairs itself.   If other illnesses are present.   If you are being treated for cancer with medications (chemotherapy).   Radiation.   The cause of the avascular necrosis.  DIAGNOSIS   The diagnosis of aseptic necrosis is usually made by:   Taking a history.   Doing an exam.   Taking X-rays. (If X-rays are normal, an MRI may be required.)   Sometimes further blood work and  specialized studies may be necessary.  TREATMENT   Treatment for this disease is necessary to maintain joint function. If untreated, most patients will suffer severe pain and limitation in movement within 2 years. Several treatments are available that help prevent further bone and joint damage. They can also reduce pain. To determine the most appropriate treatment, the caregiver considers the following aspects of a patient's disease:   The age of the patient.   The stage of the disease (early or late).   The location and amount of bone affected. It may be a small or large area.   The underlying cause of avascular necrosis.  The goals in treatment are to:   Improve the patient's use of the affected joint.   Stop further damage to the bone.   Improve bone and joint survival.  Your caregiver may use one or more of the following treatments:   Reduced weight bearing. If avascular necrosis is diagnosed early, the caregiver may begin treatment by having the patient limit weight on the affected joint. The caregiver may recommend limiting activities or using crutches. In some cases, reduced weight bearing can slow the damage caused by the disease and permit natural healing. When combined with medication to reduce pain, reduced weight bearing can be an effective way to avoid or delay surgery for some patients. Most patients eventually will need surgery to reconstruct the joint.     Core decompression. Core decompression works best in people who are in the earliest stages of avascular necrosis, before the collapse of the joint. This procedure often can reduce pain and slow the progression of bone and joint destruction in these patients. This surgical procedure removes the inner layer of bone, which:   Reduces pressure within the bone.   Increases blood flow to the bone.   Allows more blood vessels to form.   Reduces pain.   Osteotomy. This surgical procedure re-shapes the bone to reduce stress on the affected area  of the joint. There is a lengthy recovery period. The patient's activities are very limited for 3 to 12 months after an osteotomy. This procedure is most effective for younger patients with advanced avascular necrosis, and those with a large area of affected bone.   Bone Graft. A bone graft may be used to support a joint after core decompression. Bone grafting is surgery that transplants healthy bone from one part of the patient, such as the leg, to the diseased area. Sometimes the bone is taken with it's blood vessels which are attached to local blood vessels near the area of bone collapse. This is called a vascularized bone graft. There is a lengthy recovery period after a bone graft, usually from 6 to 12 months. This procedure is technically complex.   Arthroplasty. Arthroplasty is also known as total joint replacement. Total joint replacement is used in late-stage avascular necrosis, and when the joint is deformed. In this surgery, the diseased joint is replaced with artificial parts. It may be recommended for people who are not good candidates for other treatments, such as patients who may not do well with repeated attempts to preserve the joint. Various types of replacements are available, and patients should discuss specific needs with their caregiver.  New treatments being tried include:   The use of medications.   Electrical stimulation.   Combination therapies to increase the growth of new bone and blood vessels.  Document Released: 01/24/2002 Document Revised: 10/27/2011 Document Reviewed: 10/12/2013  ExitCare Patient Information 2015 [employer]. This information is not intended to replace advice given to you by your health care provider. Make sure you discuss any questions you have with your health care provider.

## 2015-02-12 NOTE — Progress Notes (Signed)
Quick Note:  Labs addressed at office as well as medications and patient was made aware. ______ 

## 2015-02-12 NOTE — Progress Notes (Signed)
Patient here for follow up for DM Type 1. Patient denies any pain today. Patient states he has to have a hip replacement and reports that he has pain in his hips when he tries to do things like cutting the grass. Pain comes and goes. Pain reported as aching pain. Patient would like to see what can be done about the pain.   Patient CBG is 248. Patient has not ate this morning and has not taken any insulin today.

## 2015-02-17 ENCOUNTER — Emergency Department (HOSPITAL_COMMUNITY): Payer: Medicaid Other

## 2015-02-17 ENCOUNTER — Emergency Department (HOSPITAL_COMMUNITY)
Admission: EM | Admit: 2015-02-17 | Discharge: 2015-02-17 | Disposition: A | Discharge status: Home / Self Care | Payer: Medicaid Other | Attending: Emergency Medicine | Admitting: Emergency Medicine

## 2015-02-17 ENCOUNTER — Encounter (HOSPITAL_COMMUNITY): Payer: Self-pay | Admitting: *Deleted

## 2015-02-17 DIAGNOSIS — Z79899 Other long term (current) drug therapy: Secondary | ICD-10-CM | POA: Diagnosis not present

## 2015-02-17 DIAGNOSIS — Z880 Allergy status to penicillin: Secondary | ICD-10-CM | POA: Insufficient documentation

## 2015-02-17 DIAGNOSIS — Z720 Tobacco use: Secondary | ICD-10-CM | POA: Diagnosis not present

## 2015-02-17 DIAGNOSIS — K219 Gastro-esophageal reflux disease without esophagitis: Secondary | ICD-10-CM | POA: Diagnosis not present

## 2015-02-17 DIAGNOSIS — E109 Type 1 diabetes mellitus without complications: Secondary | ICD-10-CM | POA: Insufficient documentation

## 2015-02-17 DIAGNOSIS — R0789 Other chest pain: Secondary | ICD-10-CM | POA: Insufficient documentation

## 2015-02-17 DIAGNOSIS — Z872 Personal history of diseases of the skin and subcutaneous tissue: Secondary | ICD-10-CM | POA: Diagnosis not present

## 2015-02-17 DIAGNOSIS — R079 Chest pain, unspecified: Secondary | ICD-10-CM | POA: Diagnosis present

## 2015-02-17 DIAGNOSIS — Z794 Long term (current) use of insulin: Secondary | ICD-10-CM | POA: Diagnosis not present

## 2015-02-17 LAB — CBC WITH DIFFERENTIAL/PLATELET
Basophils Absolute: 0 10*3/uL (ref 0.0–0.1)
Basophils Relative: 0 % (ref 0–1)
EOS ABS: 0.2 10*3/uL (ref 0.0–0.7)
Eosinophils Relative: 3 % (ref 0–5)
HEMATOCRIT: 39.3 % (ref 39.0–52.0)
HEMOGLOBIN: 13.8 g/dL (ref 13.0–17.0)
Lymphocytes Relative: 14 % (ref 12–46)
Lymphs Abs: 1.1 10*3/uL (ref 0.7–4.0)
MCH: 32 pg (ref 26.0–34.0)
MCHC: 35.1 g/dL (ref 30.0–36.0)
MCV: 91.2 fL (ref 78.0–100.0)
Monocytes Absolute: 0.6 10*3/uL (ref 0.1–1.0)
Monocytes Relative: 8 % (ref 3–12)
Neutro Abs: 5.6 10*3/uL (ref 1.7–7.7)
Neutrophils Relative %: 75 % (ref 43–77)
Platelets: 331 10*3/uL (ref 150–400)
RBC: 4.31 MIL/uL (ref 4.22–5.81)
RDW: 12.8 % (ref 11.5–15.5)
WBC: 7.6 10*3/uL (ref 4.0–10.5)

## 2015-02-17 LAB — I-STAT CHEM 8, ED
BUN: <3 mg/dL — ABNORMAL LOW (ref 6–20)
CREATININE: 0.6 mg/dL — AB (ref 0.61–1.24)
Calcium, Ion: 1.1 mmol/L — ABNORMAL LOW (ref 1.12–1.23)
Chloride: 98 mmol/L — ABNORMAL LOW (ref 101–111)
Glucose, Bld: 210 mg/dL — ABNORMAL HIGH (ref 65–99)
HEMATOCRIT: 39 % (ref 39.0–52.0)
Hemoglobin: 13.3 g/dL (ref 13.0–17.0)
POTASSIUM: 3.8 mmol/L (ref 3.5–5.1)
SODIUM: 136 mmol/L (ref 135–145)
TCO2: 24 mmol/L (ref 0–100)

## 2015-02-17 LAB — BASIC METABOLIC PANEL
Anion gap: 17 — ABNORMAL HIGH (ref 5–15)
BUN: <5 mg/dL — ABNORMAL LOW (ref 6–20)
CO2: 21 mmol/L — ABNORMAL LOW (ref 22–32)
Calcium: 9.3 mg/dL (ref 8.9–10.3)
Chloride: 95 mmol/L — ABNORMAL LOW (ref 101–111)
Creatinine, Ser: 0.74 mg/dL (ref 0.61–1.24)
Glucose, Bld: 352 mg/dL — ABNORMAL HIGH (ref 65–99)
POTASSIUM: 4.1 mmol/L (ref 3.5–5.1)
SODIUM: 133 mmol/L — AB (ref 135–145)

## 2015-02-17 LAB — TROPONIN I

## 2015-02-17 MED ORDER — GI COCKTAIL ~~LOC~~
30.0000 mL | Freq: Once | ORAL | Status: AC
  Administered 2015-02-17: 30 mL via ORAL
  Filled 2015-02-17: qty 30

## 2015-02-17 MED ORDER — KETOROLAC TROMETHAMINE 60 MG/2ML IM SOLN
60.0000 mg | Freq: Once | INTRAMUSCULAR | Status: AC
  Administered 2015-02-17: 60 mg via INTRAMUSCULAR
  Filled 2015-02-17: qty 2

## 2015-02-17 MED ORDER — SODIUM CHLORIDE 0.9 % IV BOLUS (SEPSIS)
2000.0000 mL | Freq: Once | INTRAVENOUS | Status: AC
  Administered 2015-02-17: 2000 mL via INTRAVENOUS

## 2015-02-17 MED ORDER — METHOCARBAMOL 500 MG PO TABS
1000.0000 mg | ORAL_TABLET | Freq: Once | ORAL | Status: AC
  Administered 2015-02-17: 1000 mg via ORAL
  Filled 2015-02-17: qty 2

## 2015-02-17 MED ORDER — METHOCARBAMOL 500 MG PO TABS: 500.0000 mg | ORAL_TABLET | Freq: Two times a day (BID) | ORAL | Status: DC

## 2015-02-17 MED ORDER — NAPROXEN 375 MG PO TABS: 375.0000 mg | ORAL_TABLET | Freq: Two times a day (BID) | ORAL | Status: DC

## 2015-02-17 NOTE — ED Notes (Signed)
Dr. Randal Buba in room.

## 2015-02-17 NOTE — Discharge Instructions (Signed)
Chest Wall Pain °Chest wall pain is pain felt in or around the chest bones and muscles. It may take up to 6 weeks to get better. It may take longer if you are active. Chest wall pain can happen on its own. Other times, things like germs, injury, coughing, or exercise can cause the pain. °HOME CARE  °· Avoid activities that make you tired or cause pain. Try not to use your chest, belly (abdominal), or side muscles. Do not use heavy weights. °· Put ice on the sore area. °¨ Put ice in a plastic bag. °¨ Place a towel between your skin and the bag. °¨ Leave the ice on for 15-20 minutes for the first 2 days. °· Only take medicine as told by your doctor. °GET HELP RIGHT AWAY IF:  °· You have more pain or are very uncomfortable. °· You have a fever. °· Your chest pain gets worse. °· You have new problems. °· You feel sick to your stomach (nauseous) or throw up (vomit). °· You start to sweat or feel lightheaded. °· You have a cough with mucus (phlegm). °· You cough up blood. °MAKE SURE YOU:  °· Understand these instructions. °· Will watch your condition. °· Will get help right away if you are not doing well or get worse. °Document Released: 01/21/2008 Document Revised: 10/27/2011 Document Reviewed: 03/31/2011 °[employer]® Patient Information ©2015 [employer]. This information is not intended to replace advice given to you by your health care provider. Make sure you discuss any questions you have with your health care provider. ° °

## 2015-02-17 NOTE — ED Notes (Signed)
Dr. Chaya Jan in room with patient.

## 2015-02-17 NOTE — ED Notes (Signed)
The pt reports that he has had mid-chest pain  For 2 days.  He has more pain with movement and he thinks he has swelling rt anterior ribs.  He has had some alcohol today

## 2015-02-17 NOTE — ED Notes (Signed)
Patient presents with c/o mid chest pain which began yesterday. No radiation. Small bruise noted on chest. Tender to touch. Pain increases with any movement. Patient rates pain 2/10 at rest, but increases dramatically with movement.

## 2015-02-17 NOTE — ED Provider Notes (Signed)
CSN: [id number]     Arrival date & time 02/17/15  0205 History  This chart was scribed for Newman Waren, MD by Randa Evens, ED Scribe. This patient was seen in room A01C/A01C and the patient's care was started at 2:17 AM.    Chief Complaint  Patient presents with  . Chest Pain   Patient is a 39 y.o. male presenting with chest pain. The history is provided by the patient. No language interpreter was used.  Chest Pain Pain location:  Substernal area Pain quality: dull   Pain radiates to:  Does not radiate Pain radiates to the back: no   Pain severity:  Mild Onset quality:  Gradual Duration:  2 days Timing:  Constant Progression:  Unchanged Chronicity:  New Context: movement   Context: not at rest   Relieved by:  Nothing Worsened by:  Movement Associated symptoms: no back pain, no claudication, no cough, no fever, no lower extremity edema, no nausea, no palpitations and no shortness of breath   Risk factors: no aortic disease and no immobilization    HPI Comments: Jared Murray is a 39 y.o. male who presents to the Emergency Department complaining of CP onset 2 days prior. Pt states that the pain is worse with movement and palpation. Pt states that he tried tramadol with no relief. Pt denies leg swelling or cough. Pt denies illicit drug use. Pt does report ETOH use tonight.   Past Medical History  Diagnosis Date  . Eczema   . Type 1 diabetes mellitus   . Psoriasis   . GERD (gastroesophageal reflux disease)    Past Surgical History  Procedure Laterality Date  . Finger surgery     Family History  Problem Relation Age of Onset  . Hypertension Father   . Diabetes      multiple  . Lupus Cousin   . Stroke Maternal Grandmother   . Stroke Paternal Grandmother    History  Substance Use Topics  . Smoking status: Current Some Day Smoker    Types: Cigarettes  . Smokeless tobacco: Not on file     Comment: Smoking 5 cigs/week  . Alcohol Use: 0.0 oz/week    0 Standard  drinks or equivalent per week     Comment: recently quit - 7/14     Review of Systems  Constitutional: Negative for fever.  Respiratory: Negative for cough and shortness of breath.   Cardiovascular: Positive for chest pain. Negative for palpitations, claudication and leg swelling.  Gastrointestinal: Negative for nausea.  Musculoskeletal: Negative for back pain.  All other systems reviewed and are negative.    Allergies  Penicillins  Home Medications   Prior to Admission medications   Medication Sig Start Date End Date Taking? Authorizing Provider  clobetasol cream (TEMOVATE) 5.42 % Apply 1 application topically 2 (two) times daily. 08/04/14  Yes Deepak Advani, MD  insulin glargine (LANTUS) 100 UNIT/ML injection Inject 0.25 mLs (25 Units total) into the skin daily. 01/29/15  Yes [name], MD  lisinopril (PRINIVIL,ZESTRIL) 2.5 MG tablet Take 2 tablets (5 mg total) by mouth daily. 01/29/15  Yes [name], MD  NOVOLOG 100 UNIT/ML injection Sliding scale CBG 70 - 120: 0 units CBG 121 - 150: 1 unit,  CBG 151 - 200: 2 units,  CBG 201 - 250: 3 units,  CBG 251 - 300: 5 units,  CBG 301 - 350: 7 units,  CBG 351 - 400: 9 units   CBG > 400: 10 units Patient taking differently:  Inject 1-10 Units into the skin 3 (three) times daily with meals. Sliding scale CBG 70 - 120: 0 units CBG 121 - 150: 1 unit,  CBG 151 - 200: 2 units,  CBG 201 - 250: 3 units,  CBG 251 - 300: 5 units,  CBG 301 - 350: 7 units,  CBG 351 - 400: 9 units   CBG > 400: 10 units 10/12/14  Yes Ripudeep K Rai, MD  pantoprazole (PROTONIX) 40 MG tablet Take 1 tablet (40 mg total) by mouth daily. 01/29/15  Yes [name], MD  promethazine (PHENERGAN) 12.5 MG tablet Take 1 tablet (12.5 mg total) by mouth every 6 (six) hours as needed for nausea or vomiting. 10/12/14  Yes Ripudeep Krystal Eaton, MD  traMADol (ULTRAM) 50 MG tablet Take 1 tablet (50 mg total) by mouth every 8 (eight) hours as needed. 02/12/15  Yes [name], MD  triamcinolone  cream (KENALOG) 0.1 % Apply 1 application topically 2 (two) times daily. 01/29/15  Yes [name], MD  Insulin Syringe-Needle U-100 (B-D INS SYRINGE 2CC/29GX1/2") 29G X 1/2" 2 ML MISC Check Blood sugar TID and QHS 03/22/14   Olugbemiga E Jegede, MD   BP 109/86 mmHg  Pulse 111  Temp(Src) 98.2 F (36.8 C) (Oral)  Resp 20  SpO2 99%   Physical Exam  Constitutional: He is oriented to person, place, and time. He appears well-developed and well-nourished. No distress.  HENT:  Head: Normocephalic and atraumatic.  Mouth/Throat: Oropharynx is clear and moist. No oropharyngeal exudate.  Eyes: Conjunctivae and EOM are normal. Pupils are equal, round, and reactive to light.  Neck: Normal range of motion. Neck supple. No tracheal deviation present.  Cardiovascular: Normal rate.   Pulmonary/Chest: Effort normal. No respiratory distress. He exhibits tenderness.  Abdominal: Soft. Bowel sounds are increased. There is no tenderness. There is no rebound and no guarding.  Musculoskeletal: Normal range of motion. He exhibits no edema or tenderness.  Neurological: He is alert and oriented to person, place, and time.  Skin: Skin is warm and dry.  Psychiatric: He has a normal mood and affect. His behavior is normal.  Nursing note and vitals reviewed.   ED Course  Procedures (including critical care time) DIAGNOSTIC STUDIES: Oxygen Saturation is 99% on RA, normal by my interpretation.    COORDINATION OF CARE: 2:39 AM-Discussed treatment plan with pt at bedside and pt agreed to plan.     Labs Review Labs Reviewed  BASIC METABOLIC PANEL - Abnormal; Notable for the following:    Sodium 133 (*)    Chloride 95 (*)    CO2 21 (*)    Glucose, Bld 352 (*)    BUN <5 (*)    Anion gap 17 (*)    All other components within normal limits  TROPONIN I  CBC WITH DIFFERENTIAL/PLATELET    Imaging Review Dg Chest 2 View  02/17/2015   CLINICAL DATA:  Chest pain, 2 days duration.  EXAM: CHEST  2 VIEW  COMPARISON:   01/22/2015  FINDINGS: The heart size and mediastinal contours are within normal limits. Both lungs are clear. The visualized skeletal structures are unremarkable.  IMPRESSION: No active cardiopulmonary disease.   Electronically Signed   By: Andreas Newport M.D.   On: 02/17/2015 02:59     EKG Interpretation   Date/Time:  Saturday February 17 2015 02:12:07 EDT Ventricular Rate:  89 PR Interval:  130 QRS Duration: 130 QT Interval:  370 QTC Calculation: 450 R Axis:   114 Text Interpretation:  Normal sinus rhythm Right bundle branch block  Confirmed by [facility]  MD, Benn Tarver ([id number]) on 02/17/2015 3:02:43 AM      MDM   Final diagnoses:  None   Medications  gi cocktail (Maalox,Lidocaine,Donnatal) (30 mLs Oral Given 02/17/15 0254)  ketorolac (TORADOL) injection 60 mg (60 mg Intramuscular Given 02/17/15 0333)  methocarbamol (ROBAXIN) tablet 1,000 mg (1,000 mg Oral Given 02/17/15 0327)  sodium chloride 0.9 % bolus 2,000 mL (0 mLs Intravenous Stopped 02/17/15 0543)   Symptoms and exam are consistent with MSK pain.  Improved in the ED with medication ACS excluded.  PERC negative wells 0.  Highly doubt PE.    Glucose improved anion gap normalized with 2 liters of IVF.  Safe for discharge.  Take your insulin as prescribed.  Stop all alcohol as this is bad for you and your glucose..  Follow up with your PMD for ongoing care   I personally performed the services described in this documentation, which was scribed in my presence. The recorded information has been reviewed and is accurate.      Veatrice Kells, MD 02/17/15 [phone]

## 2015-02-17 NOTE — ED Notes (Signed)
Patient resting in bed with eyes closed. No distress noted.

## 2015-02-20 ENCOUNTER — Telehealth: Payer: Self-pay

## 2015-02-20 NOTE — Telephone Encounter (Signed)
Called the patient at # [phone] to check on his status.  Voice mail message left requesting a call bck to # [phone] or # [phone].

## 2015-02-21 ENCOUNTER — Telehealth: Payer: Self-pay

## 2015-02-21 NOTE — Telephone Encounter (Signed)
Call placed to patient to follow-up as patient was seen in ED on 02/17/15 for chest pain. Patient indicates he was told pain was from  "pulled muscle in his chest." He indicates pain mostly feeling better, but prescribed medications "not taking pain all away." Pain currently 5/10. Encouraged patient to continue taking medications (naproxen 375 mg bid and robaxin 500 mg bid) as prescribed. Patient verbalized understanding although he indicates if pain does not improve he plans to go back to ED tomorrow. Discussed ED follow-up appointment at clinic tomorrow and patient agreeable; patient scheduled an appointment on 02/22/15 at 1530 with Dr. Annitta Needs.

## 2015-02-21 NOTE — Telephone Encounter (Signed)
Patient called and notified of appointment on 02/22/15 at 1530 with Dr. Annitta Needs. Patient verbalized understanding and appreciative of appointment.

## 2015-02-22 ENCOUNTER — Ambulatory Visit: Payer: Medicaid Other | Attending: Internal Medicine | Admitting: Internal Medicine

## 2015-02-22 ENCOUNTER — Encounter: Payer: Self-pay | Admitting: Internal Medicine

## 2015-02-22 ENCOUNTER — Other Ambulatory Visit: Payer: Self-pay

## 2015-02-22 VITALS — BP 109/75 | HR 122 | Temp 98.5°F | Resp 20 | Ht 70.0 in | Wt 135.8 lb

## 2015-02-22 DIAGNOSIS — E139 Other specified diabetes mellitus without complications: Secondary | ICD-10-CM | POA: Diagnosis not present

## 2015-02-22 DIAGNOSIS — L309 Dermatitis, unspecified: Secondary | ICD-10-CM | POA: Insufficient documentation

## 2015-02-22 DIAGNOSIS — R079 Chest pain, unspecified: Secondary | ICD-10-CM | POA: Diagnosis not present

## 2015-02-22 DIAGNOSIS — R000 Tachycardia, unspecified: Secondary | ICD-10-CM | POA: Diagnosis present

## 2015-02-22 DIAGNOSIS — F17210 Nicotine dependence, cigarettes, uncomplicated: Secondary | ICD-10-CM | POA: Insufficient documentation

## 2015-02-22 DIAGNOSIS — K219 Gastro-esophageal reflux disease without esophagitis: Secondary | ICD-10-CM | POA: Insufficient documentation

## 2015-02-22 DIAGNOSIS — I4510 Unspecified right bundle-branch block: Secondary | ICD-10-CM | POA: Insufficient documentation

## 2015-02-22 DIAGNOSIS — Z794 Long term (current) use of insulin: Secondary | ICD-10-CM | POA: Insufficient documentation

## 2015-02-22 DIAGNOSIS — R0789 Other chest pain: Secondary | ICD-10-CM

## 2015-02-22 DIAGNOSIS — Z79899 Other long term (current) drug therapy: Secondary | ICD-10-CM | POA: Insufficient documentation

## 2015-02-22 LAB — COMPLETE METABOLIC PANEL WITH GFR
ALBUMIN: 4.2 g/dL (ref 3.5–5.2)
AST: 16 U/L (ref 0–37)
Alkaline Phosphatase: 126 U/L — ABNORMAL HIGH (ref 39–117)
BUN: 2 mg/dL — ABNORMAL LOW (ref 6–23)
CHLORIDE: 96 meq/L (ref 96–112)
CO2: 28 mEq/L (ref 19–32)
Calcium: 10.1 mg/dL (ref 8.4–10.5)
Creat: 0.69 mg/dL (ref 0.50–1.35)
Glucose, Bld: 84 mg/dL (ref 70–99)
POTASSIUM: 4.5 meq/L (ref 3.5–5.3)
Sodium: 137 mEq/L (ref 135–145)
Total Bilirubin: 0.4 mg/dL (ref 0.2–1.2)
Total Protein: 7.5 g/dL (ref 6.0–8.3)

## 2015-02-22 LAB — GLUCOSE, POCT (MANUAL RESULT ENTRY): POC GLUCOSE: 289 mg/dL — AB (ref 70–99)

## 2015-02-22 LAB — TSH: TSH: 0.7 u[IU]/mL (ref 0.350–4.500)

## 2015-02-22 MED ORDER — TRIAMCINOLONE ACETONIDE 0.1 % EX CREA: 1.0000 "application " | TOPICAL_CREAM | Freq: Two times a day (BID) | CUTANEOUS | Status: DC

## 2015-02-22 NOTE — Progress Notes (Signed)
Patient here for ED follow up for chest pain.  Patient states his chest feels "sore, like a pulled muscle."  Patient states he cannot get comfortable to sleep (sleeps on stomach) and finds it difficult changing positions.  Patient wants to make sure that it is pulled muscle and not anything more serious.  Patient reports being constipated since last week but had a bowel movement today.  Patient thinks that discomfort could possibly be gas build up as well.  Patient reports not taking Lisinopril since last Thursday because of chest pain.  Patient wants to make sure it is ok for him to start taking it again.  BP: 109/75, HR: 122, O2: 99%, CBG: 289 (patient reports eating hotdog and chips around 1:40pm)  Patient requests larger tube of Kenalog be sent to his pharmacy [facility])

## 2015-02-22 NOTE — Progress Notes (Signed)
MRN: [id number] Name: RAKEEM COLLEY  Sex: male Age: 39 y.o. DOB: 01-23-1976  Allergies: Penicillins  Chief Complaint  Patient presents with  . Hospitalization Follow-up  . Chest Pain    HPI: Patient is 39 y.o. male who has history of diabetes recently went to the emergency room with symptoms of chest pain, EMR reviewed patient was diagnosed with musculoskeletal pain, his EKG shows RBBB, patient was prescribed pain medication naproxen as well as Flexeril which is taking, as per patient is compliant in taking his diabetes medication he has not taken the blood pressure medication, today's blood pressure is on the low side is also tachycardic, repeat EKG today shows sinus tachycardia at the rate of 108 with right bundle branch block, his chest pain is more of worse with movement and palpation denies any orthopnea PND or shortness of breath denies any headache dizziness, since his blood pressure is low I have advised to give him some IV fluids patient is reluctant and has to go today but wants to come back tomorrow.  Past Medical History  Diagnosis Date  . Eczema   . Type 1 diabetes mellitus   . Psoriasis   . GERD (gastroesophageal reflux disease)     Past Surgical History  Procedure Laterality Date  . Finger surgery        Medication List       This list is accurate as of: 02/22/15  4:44 PM.  Always use your most recent med list.               clobetasol cream 0.05 %  Commonly known as:  TEMOVATE  Apply 1 application topically 2 (two) times daily.     insulin glargine 100 UNIT/ML injection  Commonly known as:  LANTUS  Inject 0.25 mLs (25 Units total) into the skin daily.     Insulin Syringe-Needle U-100 29G X 1/2" 2 ML Misc  Commonly known as:  B-D INS SYRINGE 2CC/29GX1/2"  Check Blood sugar TID and QHS     lisinopril 2.5 MG tablet  Commonly known as:  PRINIVIL,ZESTRIL  Take 2 tablets (5 mg total) by mouth daily.     methocarbamol 500 MG tablet  Commonly  known as:  ROBAXIN  Take 1 tablet (500 mg total) by mouth 2 (two) times daily.     naproxen 375 MG tablet  Commonly known as:  NAPROSYN  Take 1 tablet (375 mg total) by mouth 2 (two) times daily.     NOVOLOG 100 UNIT/ML injection  Generic drug:  insulin aspart  Sliding scale CBG 70 - 120: 0 units CBG 121 - 150: 1 unit,  CBG 151 - 200: 2 units,  CBG 201 - 250: 3 units,  CBG 251 - 300: 5 units,  CBG 301 - 350: 7 units,  CBG 351 - 400: 9 units   CBG > 400: 10 units     pantoprazole 40 MG tablet  Commonly known as:  PROTONIX  Take 1 tablet (40 mg total) by mouth daily.     promethazine 12.5 MG tablet  Commonly known as:  PHENERGAN  Take 1 tablet (12.5 mg total) by mouth every 6 (six) hours as needed for nausea or vomiting.     traMADol 50 MG tablet  Commonly known as:  ULTRAM  Take 1 tablet (50 mg total) by mouth every 8 (eight) hours as needed.     triamcinolone cream 0.1 %  Commonly known as:  KENALOG  Apply 1 application topically  2 (two) times daily.        Meds ordered this encounter  Medications  . triamcinolone cream (KENALOG) 0.1 %    Sig: Apply 1 application topically 2 (two) times daily.    Dispense:  80 g    Refill:  2    Immunization History  Administered Date(s) Administered  . Pneumococcal Polysaccharide-23 03/18/2013, 01/26/2014    Family History  Problem Relation Age of Onset  . Hypertension Father   . Diabetes      multiple  . Lupus Cousin   . Stroke Maternal Grandmother   . Stroke Paternal Grandmother     History  Substance Use Topics  . Smoking status: Current Some Day Smoker    Types: Cigarettes  . Smokeless tobacco: Not on file     Comment: Smoking 5 cigs/week  . Alcohol Use: No     Comment: recently quit - 7/14     Review of Systems   As noted in HPI  Filed Vitals:   02/22/15 1603  BP: 109/75  Pulse: 122  Temp: 98.5 F (36.9 C)  Resp: 20    Physical Exam  Physical Exam  Constitutional: No distress.  Eyes: EOM are  normal. Pupils are equal, round, and reactive to light.  Neck: Neck supple.  Cardiovascular: Regular rhythm and normal heart sounds.   No murmur heard. tachycardic  Pulmonary/Chest: Breath sounds normal. No respiratory distress. He has no wheezes. He has no rales.  Palpable tenderness on the lower chest wall  Abdominal: Soft. There is no tenderness. There is no rebound.  Musculoskeletal: He exhibits no edema.    CBC    Component Value Date/Time   WBC 7.6 02/17/2015 0218   RBC 4.31 02/17/2015 0218   HGB 13.3 02/17/2015 0612   HCT 39.0 02/17/2015 0612   PLT 331 02/17/2015 0218   MCV 91.2 02/17/2015 0218   LYMPHSABS 1.1 02/17/2015 0218   MONOABS 0.6 02/17/2015 0218   EOSABS 0.2 02/17/2015 0218   BASOSABS 0.0 02/17/2015 0218    CMP     Component Value Date/Time   NA 136 02/17/2015 0612   K 3.8 02/17/2015 0612   CL 98* 02/17/2015 0612   CO2 21* 02/17/2015 0218   GLUCOSE 210* 02/17/2015 0612   BUN <3* 02/17/2015 0612   CREATININE 0.60* 02/17/2015 0612   CREATININE 0.70 01/29/2015 1123   CALCIUM 9.3 02/17/2015 0218   PROT 7.9 01/22/2015 0307   ALBUMIN 4.4 01/22/2015 0307   AST 43* 01/22/2015 0307   ALT 26 01/22/2015 0307   ALKPHOS 169* 01/22/2015 0307   BILITOT 2.1* 01/22/2015 0307   GFRNONAA >60 02/17/2015 0218   GFRAA >60 02/17/2015 0218    No results found for: CHOL  Lab Results  Component Value Date/Time   HGBA1C 14.3* 01/22/2015 06:02 PM   HGBA1C 8.6 08/04/2014 12:11 PM    Lab Results  Component Value Date/Time   AST 43* 01/22/2015 03:07 AM    Assessment and Plan  Rapid heart rate - Plan: EKG 12-Lead  Shows sinus tachycardia at 108 beats per minute with RBBB,  Will checkTSH, COMPLETE METABOLIC PANEL WITH GFR, Troponin I  Since his blood pressure is on the borderline low side I have advised patient to not take blood pressure medication, again advised for IV hydration patient is reluctant and would like to come back by tomorrow for recheck of his blood  pressure, I have advised patient to get immediate medical attention if she has any worsening symptoms or develop  any new symptoms of headache dizziness chest pain shortness of breath, patient understand and verbalized instructions  Other specified diabetes mellitus without complications - Plan:   Results for orders placed or performed in visit on 02/22/15  Glucose (CBG)  Result Value Ref Range   POC Glucose 289 (A) 70 - 99 mg/dl   His medications were recently optimized currently on Lantus and NovoLog sliding scale, recheck A1c in 3 months.  Musculoskeletal chest pain - Plan: patient to continue with naproxen, Flexeril we'll also recheck Troponin I  Eczema - Plan: triamcinolone cream (KENALOG) 0.1 %   Return in about 3 months (around 05/25/2015) for BP/HR check  nurse visit 02/23/2015.   This note has been created with Surveyor, quantity. Any transcriptional errors are unintentional.    Lorayne Marek, MD

## 2015-02-23 ENCOUNTER — Telehealth: Payer: Self-pay

## 2015-02-23 LAB — TROPONIN I

## 2015-02-23 NOTE — Telephone Encounter (Signed)
-----   Message from Lorayne Marek, MD sent at 02/23/2015  1:00 PM EDT ----- Call and the patient know that his blood work shows improved blood glucose level and his TSH level is normal .

## 2015-02-23 NOTE — Telephone Encounter (Signed)
Unavailable Home number and cell number is incorrect and states Please check the number and try your call again

## 2015-02-28 ENCOUNTER — Telehealth: Payer: Self-pay

## 2015-02-28 NOTE — Telephone Encounter (Signed)
Called the patient [phone]  to check on his status. Voice mail message left requesting a call back to # [phone] or [phone].

## 2015-03-01 ENCOUNTER — Telehealth: Payer: Self-pay | Admitting: *Deleted

## 2015-03-01 NOTE — Telephone Encounter (Signed)
Left HIPAA compliant message for patient to return my call.  RN was calling to check on patient to see if he needed appointment at clinic for chest pain etc.

## 2015-03-03 ENCOUNTER — Encounter (HOSPITAL_COMMUNITY): Payer: Self-pay | Admitting: Emergency Medicine

## 2015-03-03 ENCOUNTER — Emergency Department (HOSPITAL_COMMUNITY)
Admission: EM | Admit: 2015-03-03 | Discharge: 2015-03-03 | Disposition: A | Discharge status: Home / Self Care | Payer: Medicaid Other | Attending: Emergency Medicine | Admitting: Emergency Medicine

## 2015-03-03 DIAGNOSIS — R5383 Other fatigue: Secondary | ICD-10-CM | POA: Diagnosis not present

## 2015-03-03 DIAGNOSIS — Z794 Long term (current) use of insulin: Secondary | ICD-10-CM | POA: Diagnosis not present

## 2015-03-03 DIAGNOSIS — R739 Hyperglycemia, unspecified: Secondary | ICD-10-CM

## 2015-03-03 DIAGNOSIS — Z880 Allergy status to penicillin: Secondary | ICD-10-CM | POA: Insufficient documentation

## 2015-03-03 DIAGNOSIS — R110 Nausea: Secondary | ICD-10-CM | POA: Diagnosis not present

## 2015-03-03 DIAGNOSIS — L03311 Cellulitis of abdominal wall: Secondary | ICD-10-CM | POA: Insufficient documentation

## 2015-03-03 DIAGNOSIS — Z79899 Other long term (current) drug therapy: Secondary | ICD-10-CM | POA: Insufficient documentation

## 2015-03-03 DIAGNOSIS — E1065 Type 1 diabetes mellitus with hyperglycemia: Secondary | ICD-10-CM | POA: Insufficient documentation

## 2015-03-03 DIAGNOSIS — Z720 Tobacco use: Secondary | ICD-10-CM | POA: Diagnosis not present

## 2015-03-03 DIAGNOSIS — K219 Gastro-esophageal reflux disease without esophagitis: Secondary | ICD-10-CM | POA: Insufficient documentation

## 2015-03-03 DIAGNOSIS — Z791 Long term (current) use of non-steroidal anti-inflammatories (NSAID): Secondary | ICD-10-CM | POA: Insufficient documentation

## 2015-03-03 LAB — URINALYSIS, ROUTINE W REFLEX MICROSCOPIC
BILIRUBIN URINE: NEGATIVE
Hgb urine dipstick: NEGATIVE
Ketones, ur: 40 mg/dL — AB
Leukocytes, UA: NEGATIVE
Nitrite: NEGATIVE
Protein, ur: NEGATIVE mg/dL
Specific Gravity, Urine: 1.03 (ref 1.005–1.030)
Urobilinogen, UA: 0.2 mg/dL (ref 0.0–1.0)
pH: 5 (ref 5.0–8.0)

## 2015-03-03 LAB — CBC WITH DIFFERENTIAL/PLATELET
BASOS ABS: 0 10*3/uL (ref 0.0–0.1)
BASOS PCT: 1 % (ref 0–1)
Eosinophils Absolute: 0.2 10*3/uL (ref 0.0–0.7)
Eosinophils Relative: 2 % (ref 0–5)
HCT: 33.9 % — ABNORMAL LOW (ref 39.0–52.0)
Hemoglobin: 11.7 g/dL — ABNORMAL LOW (ref 13.0–17.0)
LYMPHS PCT: 14 % (ref 12–46)
Lymphs Abs: 1.2 10*3/uL (ref 0.7–4.0)
MCH: 31.6 pg (ref 26.0–34.0)
MCHC: 34.5 g/dL (ref 30.0–36.0)
MCV: 91.6 fL (ref 78.0–100.0)
MONOS PCT: 5 % (ref 3–12)
Monocytes Absolute: 0.4 10*3/uL (ref 0.1–1.0)
NEUTROS PCT: 78 % — AB (ref 43–77)
Neutro Abs: 6.7 10*3/uL (ref 1.7–7.7)
Platelets: 535 10*3/uL — ABNORMAL HIGH (ref 150–400)
RBC: 3.7 MIL/uL — ABNORMAL LOW (ref 4.22–5.81)
RDW: 12.9 % (ref 11.5–15.5)
WBC: 8.6 10*3/uL (ref 4.0–10.5)

## 2015-03-03 LAB — CBG MONITORING, ED
GLUCOSE-CAPILLARY: 470 mg/dL — AB (ref 65–99)
GLUCOSE-CAPILLARY: 245 mg/dL — AB (ref 65–99)
Glucose-Capillary: 415 mg/dL — ABNORMAL HIGH (ref 65–99)
Glucose-Capillary: 225 mg/dL — ABNORMAL HIGH (ref 65–99)
Glucose-Capillary: 253 mg/dL — ABNORMAL HIGH (ref 65–99)

## 2015-03-03 LAB — URINE MICROSCOPIC-ADD ON

## 2015-03-03 LAB — I-STAT VENOUS BLOOD GAS, ED
Acid-base deficit: 7 mmol/L — ABNORMAL HIGH (ref 0.0–2.0)
Bicarbonate: 18.7 mEq/L — ABNORMAL LOW (ref 20.0–24.0)
O2 Saturation: 62 %
TCO2: 20 mmol/L (ref 0–100)
pCO2, Ven: 37 mmHg — ABNORMAL LOW (ref 45.0–50.0)
pH, Ven: 7.311 — ABNORMAL HIGH (ref 7.250–7.300)
pO2, Ven: 35 mmHg (ref 30.0–45.0)

## 2015-03-03 LAB — I-STAT CHEM 8, ED
BUN: 9 mg/dL (ref 6–20)
CALCIUM ION: 1.11 mmol/L — AB (ref 1.12–1.23)
CHLORIDE: 98 mmol/L — AB (ref 101–111)
CREATININE: 0.5 mg/dL — AB (ref 0.61–1.24)
GLUCOSE: 235 mg/dL — AB (ref 65–99)
HEMATOCRIT: 34 % — AB (ref 39.0–52.0)
Hemoglobin: 11.6 g/dL — ABNORMAL LOW (ref 13.0–17.0)
POTASSIUM: 3.7 mmol/L (ref 3.5–5.1)
Sodium: 136 mmol/L (ref 135–145)
TCO2: 22 mmol/L (ref 0–100)

## 2015-03-03 LAB — BASIC METABOLIC PANEL
ANION GAP: 15 (ref 5–15)
BUN: 11 mg/dL (ref 6–20)
CO2: 18 mmol/L — ABNORMAL LOW (ref 22–32)
Calcium: 8.4 mg/dL — ABNORMAL LOW (ref 8.9–10.3)
Chloride: 96 mmol/L — ABNORMAL LOW (ref 101–111)
Creatinine, Ser: 1.04 mg/dL (ref 0.61–1.24)
GFR calc non Af Amer: >60 mL/min (ref >60)
Glucose, Bld: 451 mg/dL — ABNORMAL HIGH (ref 65–99)
Potassium: 4.6 mmol/L (ref 3.5–5.1)
Sodium: 129 mmol/L — ABNORMAL LOW (ref 135–145)

## 2015-03-03 MED ORDER — INSULIN ASPART 100 UNIT/ML ~~LOC~~ SOLN: 10.0000 [IU] | Freq: Once | SUBCUTANEOUS | Status: DC

## 2015-03-03 MED ORDER — SODIUM CHLORIDE 0.9 % IV BOLUS (SEPSIS)
2000.0000 mL | Freq: Once | INTRAVENOUS | Status: AC
  Administered 2015-03-03: 2000 mL via INTRAVENOUS

## 2015-03-03 MED ORDER — SODIUM CHLORIDE 0.9 % IV BOLUS (SEPSIS): 1000.0000 mL | Freq: Once | INTRAVENOUS | Status: DC

## 2015-03-03 MED ORDER — CLINDAMYCIN HCL 150 MG PO CAPS: 300.0000 mg | ORAL_CAPSULE | Freq: Four times a day (QID) | ORAL | Status: AC

## 2015-03-03 MED ORDER — INSULIN ASPART 100 UNIT/ML ~~LOC~~ SOLN
10.0000 [IU] | Freq: Once | SUBCUTANEOUS | Status: AC
  Administered 2015-03-03: 10 [IU] via SUBCUTANEOUS
  Filled 2015-03-03: qty 1

## 2015-03-03 MED ORDER — CLINDAMYCIN HCL 300 MG PO CAPS
300.0000 mg | ORAL_CAPSULE | Freq: Once | ORAL | Status: AC
  Administered 2015-03-03: 300 mg via ORAL
  Filled 2015-03-03: qty 2
  Filled 2015-03-03: qty 1

## 2015-03-03 NOTE — ED Provider Notes (Signed)
Plan of nausea onset this morning accompanied by generalized weakness. Blood sugar at home noted the elevated. Other associated symptoms include a painful area over anterior chest wall for the past one week. Patient feels much improved after treatment here. On exam alert no distress lungs clear auscultation heart regular rate and rhythm, mildly tachycardic abdomen nondistended nontender. Skin there is a proximal leg 3 cm also prescribed fluctuant area at anterior chest, minimally tender  Orlie Dakin, MD 03/03/15 2045

## 2015-03-03 NOTE — ED Provider Notes (Signed)
CSN: [id number]     Arrival date & time 03/03/15  1711 History   First MD Initiated Contact with Patient 03/03/15 1713     Chief Complaint  Patient presents with  . Hyperglycemia   (Consider location/radiation/quality/duration/timing/severity/associated sxs/prior Treatment) Patient is a 39 y.o. male presenting with hyperglycemia. The history is provided by the patient.  Hyperglycemia Blood sugar level PTA:  485 Severity:  Severe Onset quality:  Gradual Timing:  Constant Progression:  Worsening Chronicity:  Recurrent Diabetes status:  Controlled with insulin Current diabetic therapy:  Lantus 25 units q HS and sliding scale novolog TID with meals Context comment:  Didn't take his meds last night, changed to this AM Relieved by:  Nothing Ineffective treatments:  Insulin Associated symptoms: fatigue and nausea   Associated symptoms: no abdominal pain, no altered mental status, no chest pain, no confusion, no diaphoresis, no dizziness, no fever, no malaise, no shortness of breath, no vomiting and no weakness   Risk factors: hx of DKA     Past Medical History  Diagnosis Date  . Eczema   . Type 1 diabetes mellitus   . Psoriasis   . GERD (gastroesophageal reflux disease)    Past Surgical History  Procedure Laterality Date  . Finger surgery     Family History  Problem Relation Age of Onset  . Hypertension Father   . Diabetes      multiple  . Lupus Cousin   . Stroke Maternal Grandmother   . Stroke Paternal Grandmother    History  Substance Use Topics  . Smoking status: Current Some Day Smoker    Types: Cigarettes  . Smokeless tobacco: Not on file     Comment: Smoking 5 cigs/week  . Alcohol Use: No     Comment: recently quit - 7/14     Review of Systems  Constitutional: Positive for fatigue. Negative for fever and diaphoresis.  Respiratory: Negative for chest tightness and shortness of breath.   Cardiovascular: Negative for chest pain.  Gastrointestinal: Positive for  nausea. Negative for vomiting and abdominal pain.  Skin: Positive for rash.  Neurological: Negative for dizziness, weakness, light-headedness and headaches.  Psychiatric/Behavioral: Negative for confusion.  All other systems reviewed and are negative.     Allergies  Penicillins  Home Medications   Prior to Admission medications   Medication Sig Start Date End Date Taking? Authorizing Provider  clobetasol cream (TEMOVATE) 4.09 % Apply 1 application topically 2 (two) times daily. 08/04/14   Lorayne Marek, MD  insulin glargine (LANTUS) 100 UNIT/ML injection Inject 0.25 mLs (25 Units total) into the skin daily. 01/29/15   [name], MD  Insulin Syringe-Needle U-100 (B-D INS SYRINGE 2CC/29GX1/2") 29G X 1/2" 2 ML MISC Check Blood sugar TID and QHS 03/22/14   Olugbemiga E Doreene Burke, MD  lisinopril (PRINIVIL,ZESTRIL) 2.5 MG tablet Take 2 tablets (5 mg total) by mouth daily. Patient not taking: Reported on 02/22/2015 01/29/15   [name], MD  methocarbamol (ROBAXIN) 500 MG tablet Take 1 tablet (500 mg total) by mouth 2 (two) times daily. 02/17/15   April Palumbo, MD  naproxen (NAPROSYN) 375 MG tablet Take 1 tablet (375 mg total) by mouth 2 (two) times daily. 02/17/15   April Palumbo, MD  NOVOLOG 100 UNIT/ML injection Sliding scale CBG 70 - 120: 0 units CBG 121 - 150: 1 unit,  CBG 151 - 200: 2 units,  CBG 201 - 250: 3 units,  CBG 251 - 300: 5 units,  CBG 301 - 350: 7 units,  CBG 351 - 400: 9 units   CBG > 400: 10 units Patient taking differently: Inject 1-10 Units into the skin 3 (three) times daily with meals. Sliding scale CBG 70 - 120: 0 units CBG 121 - 150: 1 unit,  CBG 151 - 200: 2 units,  CBG 201 - 250: 3 units,  CBG 251 - 300: 5 units,  CBG 301 - 350: 7 units,  CBG 351 - 400: 9 units   CBG > 400: 10 units 10/12/14   Ripudeep K Rai, MD  pantoprazole (PROTONIX) 40 MG tablet Take 1 tablet (40 mg total) by mouth daily. 01/29/15   [name], MD  promethazine (PHENERGAN) 12.5 MG tablet Take 1 tablet (12.5  mg total) by mouth every 6 (six) hours as needed for nausea or vomiting. 10/12/14   Ripudeep Krystal Eaton, MD  traMADol (ULTRAM) 50 MG tablet Take 1 tablet (50 mg total) by mouth every 8 (eight) hours as needed. 02/12/15   [name], MD  triamcinolone cream (KENALOG) 0.1 % Apply 1 application topically 2 (two) times daily. 02/22/15   Lorayne Marek, MD   BP 116/80 mmHg  Pulse 94  Temp(Src) 98.5 F (36.9 C) (Oral)  Resp 14  SpO2 99%   Physical Exam  Constitutional: He is oriented to person, place, and time. He appears well-developed and well-nourished. He does not appear ill. No distress.  HENT:  Head: Normocephalic and atraumatic.  Nose: Nose normal.  Mouth/Throat: Oropharynx is clear and moist. No oropharyngeal exudate.  Eyes: EOM are normal. Pupils are equal, round, and reactive to light.  Neck: Normal range of motion. Neck supple.  Cardiovascular: Normal rate, regular rhythm, normal heart sounds and intact distal pulses.   No murmur heard. Pulmonary/Chest: Effort normal and breath sounds normal. No respiratory distress. He has no wheezes. He exhibits no tenderness.  Abdominal: Soft. He exhibits no distension. There is no tenderness. There is no guarding.  Musculoskeletal: Normal range of motion. He exhibits no tenderness.  Neurological: He is alert and oriented to person, place, and time. No cranial nerve deficit. Coordination normal.  Skin: Skin is warm and dry. Rash (diffuse psoriasis) noted. He is not diaphoretic. There is erythema. No pallor.  1 cm minimally fluctuant lesion on right inferior chest wall, with 2 cm of surrounding erythema and warmth.  Not draining.  Minimally tender.    Psychiatric: He has a normal mood and affect. His behavior is normal. Judgment and thought content normal.  Nursing note and vitals reviewed.   ED Course  INCISION AND DRAINAGE Date/Time: 03/03/2015 10:00 PM Performed by: Tori Milks Authorized by: Tori Milks Consent: Verbal consent  obtained. Risks and benefits: risks, benefits and alternatives were discussed Consent given by: patient Required items: required blood products, implants, devices, and special equipment available Patient identity confirmed: verbally with patient Time out: Immediately prior to procedure a "time out" was called to verify the correct patient, procedure, equipment, support staff and site/side marked as required. Type: abscess Body area: trunk Location details: chest Anesthesia method: freeze spray. Local anesthetic: co-phenylcaine spray Needle gauge: 18 Incision type: single prick. Complexity: simple Drainage: serosanguinous Drainage amount: scant Packing material: none Patient tolerance: Patient tolerated the procedure well with no immediate complications   (including critical care time) Labs Review Labs Reviewed  CBC WITH DIFFERENTIAL/PLATELET - Abnormal; Notable for the following:    RBC 3.70 (*)    Hemoglobin 11.7 (*)    HCT 33.9 (*)    Platelets 535 (*)  Neutrophils Relative % 78 (*)    All other components within normal limits  BASIC METABOLIC PANEL - Abnormal; Notable for the following:    Sodium 129 (*)    Chloride 96 (*)    CO2 18 (*)    Glucose, Bld 451 (*)    Calcium 8.4 (*)    All other components within normal limits  URINALYSIS, ROUTINE W REFLEX MICROSCOPIC (NOT AT [facility]) - Abnormal; Notable for the following:    Glucose, UA >1000 (*)    Ketones, ur 40 (*)    All other components within normal limits  CBG MONITORING, ED - Abnormal; Notable for the following:    Glucose-Capillary 470 (*)    All other components within normal limits  I-STAT VENOUS BLOOD GAS, ED - Abnormal; Notable for the following:    pH, Ven 7.311 (*)    pCO2, Ven 37.0 (*)    Bicarbonate 18.7 (*)    Acid-base deficit 7.0 (*)    All other components within normal limits  CBG MONITORING, ED - Abnormal; Notable for the following:    Glucose-Capillary 415 (*)    All other components within  normal limits  CBG MONITORING, ED - Abnormal; Notable for the following:    Glucose-Capillary 245 (*)    All other components within normal limits  I-STAT CHEM 8, ED - Abnormal; Notable for the following:    Chloride 98 (*)    Creatinine, Ser 0.50 (*)    Glucose, Bld 235 (*)    Calcium, Ion 1.11 (*)    Hemoglobin 11.6 (*)    HCT 34.0 (*)    All other components within normal limits  CBG MONITORING, ED - Abnormal; Notable for the following:    Glucose-Capillary 225 (*)    All other components within normal limits  CBG MONITORING, ED - Abnormal; Notable for the following:    Glucose-Capillary 253 (*)    All other components within normal limits  URINE MICROSCOPIC-ADD ON  BLOOD GAS, VENOUS    Imaging Review No results found.   EKG Interpretation   Date/Time:  Saturday March 03 2015 17:22:09 EDT Ventricular Rate:  93 PR Interval:  134 QRS Duration: 131 QT Interval:  380 QTC Calculation: 473 R Axis:   86 Text Interpretation:  Sinus rhythm Right bundle branch block ST elev,  probable normal early repol pattern No significant change since last  tracing Confirmed by JACUBOWITZ  MD, SAM [phone]) on 03/03/2015 6:24:12 PM      MDM   Final diagnoses:  Hyperglycemia  Cellulitis of abdominal wall   Pt is a 39 yo M with hx of DM 1, diffuse psoriasis and eczema who presents with hyperglycemia.  Reportedly felt nauseated last night so didn't eat much, then didn't take his typical 25 units of lantus before bed.  Woke this AM and still felt some nausea.  Took his glucose and it was 385.  Then took his typical PM dose of lantus 25 units and a sliding scale novolog.  Had a very small amount of food today, then re-checked his glucose at lunch and found it to be > 400.  Reports he had had a hx of DKA before and this feels different.  He denies chest pain, SOB, continued nausea, or vomiting.  No dysuria.  Has a small fluctuant lesion on his right pectoralis area but otherwise no known rashes or  infections.   Well appearing, in NAD.  Vitals stable.  Will send labs to rule out DKA.  Given 2 L of NS boluses and 10 units of subQ novolog.   1 cm minimally fluctuant lesion on right pectoralis muscle.  Minimally tender.  Surrounding 2 cm of erythema and warmth. Looks like cellulitis around a small abscess.   POC US shows a very minimal fluid collection.  This was lanced with an 18# needle and had minimal drainage but pt felt significant relief in pressure. Given clinda here and will dc with Rx for 7 days of clinida for cellulitis.    Initial glucose 470.  VBG: 7.3/37/35/18.  BMP: Na 129, Cl 96, CO2 18, AG 15.  Somewhat atypical labs, but does not appear to be frank DKA.  Has low CO2 but overall believe his increased gap is 2/2 hyponatremia and hypochloremia.   Repeated a istat Chem 8 after NS boluses and his Na was now 136 and Cl 98. Trended glucose down to 225.   Considered stable for dc home.  Tolerating PO intake.  To take his typical home doses of lantus when he gets home.     If performed, labs, EKGs, and imaging were reviewed and interpreted by myself and my attending, and incorporated in the medical decision making.  Patient was seen with ED Attending, Dr. Gerarda Fraction, MD   Tori Milks, MD 03/04/15 [place], MD 03/04/15 [id number]

## 2015-03-03 NOTE — ED Notes (Signed)
cbg 470

## 2015-03-03 NOTE — Discharge Instructions (Signed)
Blood Glucose Monitoring °Monitoring your blood glucose (also know as blood sugar) helps you to manage your diabetes. It also helps you and your health care provider monitor your diabetes and determine how well your treatment plan is working. °WHY SHOULD YOU MONITOR YOUR BLOOD GLUCOSE? °· It can help you understand how food, exercise, and medicine affect your blood glucose. °· It allows you to know what your blood glucose is at any given moment. You can quickly tell if you are having low blood glucose (hypoglycemia) or high blood glucose (hyperglycemia). °· It can help you and your health care provider know how to adjust your medicines. °· It can help you understand how to manage an illness or adjust medicine for exercise. °WHEN SHOULD YOU TEST? °Your health care provider will help you decide how often you should check your blood glucose. This may depend on the type of diabetes you have, your diabetes control, or the types of medicines you are taking. Be sure to write down all of your blood glucose readings so that this information can be reviewed with your health care provider. See below for examples of testing times that your health care provider may suggest. °Type 1 Diabetes °· Test 4 times a day if you are in good control, using an insulin pump, or perform multiple daily injections. °· If your diabetes is not well controlled or if you are sick, you may need to monitor more often. °· It is a good idea to also monitor: °· Before and after exercise. °· Between meals and 2 hours after a meal. °· Occasionally between 2:00 a.m. and 3:00 a.m. °Type 2 Diabetes °· It can vary with each person, but generally, if you are on insulin, test 4 times a day. °· If you take medicines by mouth (orally), test 2 times a day. °· If you are on a controlled diet, test once a day. °· If your diabetes is not well controlled or if you are sick, you may need to monitor more often. °HOW TO MONITOR YOUR BLOOD GLUCOSE °Supplies  Needed °· Blood glucose meter. °· Test strips for your meter. Each meter has its own strips. You must use the strips that go with your own meter. °· A pricking needle (lancet). °· A device that holds the lancet (lancing device). °· A journal or log book to write down your results. °Procedure °· Wash your hands with soap and water. Alcohol is not preferred. °· Prick the side of your finger (not the tip) with the lancet. °· Gently milk the finger until a small drop of blood appears. °· Follow the instructions that come with your meter for inserting the test strip, applying blood to the strip, and using your blood glucose meter. °Other Areas to Get Blood for Testing °Some meters allow you to use other areas of your body (other than your finger) to test your blood. These areas are called alternative sites. The most common alternative sites are: °· The forearm. °· The thigh. °· The back area of the lower leg. °· The palm of the hand. °The blood flow in these areas is slower. Therefore, the blood glucose values you get may be delayed, and the numbers are different from what you would get from your fingers. Do not use alternative sites if you think you are having hypoglycemia. Your reading will not be accurate. Always use a finger if you are having hypoglycemia. Also, if you cannot feel your lows (hypoglycemia unawareness), always use your fingers for your   blood glucose checks. ADDITIONAL TIPS FOR GLUCOSE MONITORING  Do not reuse lancets.  Always carry your supplies with you.  All blood glucose meters have a 24-hour "hotline" number to call if you have questions or need help.  Adjust (calibrate) your blood glucose meter with a control solution after finishing a few boxes of strips. BLOOD GLUCOSE RECORD KEEPING It is a good idea to keep a daily record or log of your blood glucose readings. Most glucose meters, if not all, keep your glucose records stored in the meter. Some meters come with the ability to download  your records to your home computer. Keeping a record of your blood glucose readings is especially helpful if you are wanting to look for patterns. Make notes to go along with the blood glucose readings because you might forget what happened at that exact time. Keeping good records helps you and your health care provider to work together to achieve good diabetes management.  Document Released: 08/07/2003 Document Revised: 12/19/2013 Document Reviewed: 12/27/2012 [facility] Patient Information 2015 [place], [place]. This information is not intended to replace advice given to you by your health care provider. Make sure you discuss any questions you have with your health care provider.  Cellulitis Cellulitis is an infection of the skin and the tissue beneath it. The infected area is usually red and tender. Cellulitis occurs most often in the arms and lower legs.  CAUSES  Cellulitis is caused by bacteria that enter the skin through cracks or cuts in the skin. The most common types of bacteria that cause cellulitis are staphylococci and streptococci. SIGNS AND SYMPTOMS   Redness and warmth.  Swelling.  Tenderness or pain.  Fever. DIAGNOSIS  Your health care provider can usually determine what is wrong based on a physical exam. Blood tests may also be done. TREATMENT  Treatment usually involves taking an antibiotic medicine. HOME CARE INSTRUCTIONS   Take your antibiotic medicine as directed by your health care provider. Finish the antibiotic even if you start to feel better.  Keep the infected arm or leg elevated to reduce swelling.  Apply a warm cloth to the affected area up to 4 times per day to relieve pain.  Take medicines only as directed by your health care provider.  Keep all follow-up visits as directed by your health care provider. SEEK MEDICAL CARE IF:   You notice red streaks coming from the infected area.  Your red area gets larger or turns dark in color.  Your bone or joint  underneath the infected area becomes painful after the skin has healed.  Your infection returns in the same area or another area.  You notice a swollen bump in the infected area.  You develop new symptoms.  You have a fever. SEEK IMMEDIATE MEDICAL CARE IF:   You feel very sleepy.  You develop vomiting or diarrhea.  You have a general ill feeling (malaise) with muscle aches and pains. MAKE SURE YOU:   Understand these instructions.  Will watch your condition.  Will get help right away if you are not doing well or get worse. Document Released: 05/14/2005 Document Revised: 12/19/2013 Document Reviewed: 10/20/2011 [facility] Patient Information 2015 [place], [place]. This information is not intended to replace advice given to you by your health care provider. Make sure you discuss any questions you have with your health care provider.

## 2015-03-03 NOTE — ED Notes (Signed)
CBG 245. Nurse notified.

## 2015-03-03 NOTE — ED Notes (Signed)
Type 1 diabetic, woke up not feeling well.  Checked CBG it was 385.  Checked again it was 400.  Last time he checked it was 465.  When he checked it this am he took his novolog.  Got about 350 cc normal saline en route with EMS.  Vomited once this am.  Right now feels better.  VS: 112/76, P: 90, RR: 16, spo2 98%.

## 2015-03-03 NOTE — ED Notes (Addendum)
CBG 253 

## 2015-03-03 NOTE — ED Notes (Signed)
CBG 225. 

## 2015-03-05 ENCOUNTER — Telehealth: Payer: Self-pay

## 2015-03-05 NOTE — Telephone Encounter (Signed)
Called the patient to check om his status and to inquire if he needs to schedule a follow up appointment with his PCP.  #t [phone]  - unavailable at this time,the message notes to try again later. # [phone] - voice mail message left requesting a call back to # [phone] or [phone].

## 2015-03-07 ENCOUNTER — Telehealth: Payer: Self-pay | Admitting: Internal Medicine

## 2015-03-07 NOTE — Telephone Encounter (Signed)
[facility] calling to request referrals for pt to Endocrinology (last A1C 14.3), Dermatology, Orthopaedics (different from [facility]), Cardiology and Chiropractor (if possible, for pain). Please f/u with [facility] case worker.

## 2015-03-08 ENCOUNTER — Telehealth: Payer: Self-pay

## 2015-03-08 ENCOUNTER — Encounter: Payer: Self-pay | Admitting: Cardiovascular Disease

## 2015-03-08 DIAGNOSIS — Z0000 Encounter for general adult medical examination without abnormal findings: Secondary | ICD-10-CM

## 2015-03-08 NOTE — Telephone Encounter (Signed)
Returned call to [place] at [facility] not available Unable to leave message-mail box not set up Referrals requested placed in epic

## 2015-03-08 NOTE — Telephone Encounter (Signed)
Call placed to patient to follow-up on status. Patient recently in ED on 03/03/15 for hyperglycemia and cellulitis of abdominal wall. Patient indicates he is "feeling better"; no longer nauseous and blood glucose controlled. He indicates he was told in ED on 03/03/15 he had "cellulitis in his chest;" he confirms he is taking clindamycin as prescribed. Discussed need for follow-up appointment with Dr. Annitta Needs to ensure cellulitis improving.  Patient verbalized understanding. Hospital follow-up appointment arranged on 03/12/15 at 1145 with Dr. Annitta Needs. Patient verbalized understanding and appreciative of call.

## 2015-03-12 ENCOUNTER — Ambulatory Visit: Payer: Medicaid Other | Admitting: Internal Medicine

## 2015-03-12 ENCOUNTER — Telehealth: Payer: Self-pay

## 2015-03-12 NOTE — Telephone Encounter (Signed)
Have made several attempts to contact Jared Murray with [facility] In reference to this patient form for nutrional products Applications states to only fill out if the patient solely depends on supplements for  Protein intake. Trying to find out the patients particular circumstances so that the form can be filled out Left several messages for her to return my call but as on today no return call

## 2015-03-13 ENCOUNTER — Telehealth: Payer: Self-pay

## 2015-03-13 NOTE — Telephone Encounter (Signed)
Called the patient to inquire about his missed appointment yesterday.  He said that he got mixed up with the dates of his appointments and he knows that he needs to see the doctor to have his chest checked.  He noted that he is still taking the antibiotic and has 3 or 4 more pills left and understands that he needs to finish the medication.  He reported that he has been checking his blood sugars TID and the results have been "good" stating that the highest the blood sugars are is the low 200's.  An appointment was scheduled for 03/16/15 @ 1215 w/ Dr Annitta Needs and he said that he will have transportation to the appointment. No other problems/concerns reported.

## 2015-03-14 ENCOUNTER — Ambulatory Visit: Payer: Medicaid Other | Admitting: Internal Medicine

## 2015-03-16 ENCOUNTER — Encounter: Payer: Self-pay | Admitting: Internal Medicine

## 2015-03-16 ENCOUNTER — Ambulatory Visit: Payer: Medicaid Other | Attending: Internal Medicine | Admitting: Internal Medicine

## 2015-03-16 VITALS — BP 107/73 | HR 97 | Temp 98.3°F | Resp 18 | Ht 70.0 in | Wt 134.0 lb

## 2015-03-16 DIAGNOSIS — R739 Hyperglycemia, unspecified: Secondary | ICD-10-CM

## 2015-03-16 DIAGNOSIS — Z794 Long term (current) use of insulin: Secondary | ICD-10-CM | POA: Diagnosis not present

## 2015-03-16 DIAGNOSIS — K219 Gastro-esophageal reflux disease without esophagitis: Secondary | ICD-10-CM | POA: Insufficient documentation

## 2015-03-16 DIAGNOSIS — F17210 Nicotine dependence, cigarettes, uncomplicated: Secondary | ICD-10-CM | POA: Insufficient documentation

## 2015-03-16 DIAGNOSIS — E118 Type 2 diabetes mellitus with unspecified complications: Secondary | ICD-10-CM

## 2015-03-16 DIAGNOSIS — E1165 Type 2 diabetes mellitus with hyperglycemia: Secondary | ICD-10-CM | POA: Insufficient documentation

## 2015-03-16 DIAGNOSIS — R7309 Other abnormal glucose: Secondary | ICD-10-CM

## 2015-03-16 DIAGNOSIS — Z79899 Other long term (current) drug therapy: Secondary | ICD-10-CM | POA: Diagnosis not present

## 2015-03-16 DIAGNOSIS — IMO0002 Reserved for concepts with insufficient information to code with codable children: Secondary | ICD-10-CM

## 2015-03-16 DIAGNOSIS — L03313 Cellulitis of chest wall: Secondary | ICD-10-CM

## 2015-03-16 LAB — GLUCOSE, POCT (MANUAL RESULT ENTRY): POC Glucose: 330 mg/dl — AB (ref 70–99)

## 2015-03-16 LAB — POCT URINALYSIS DIPSTICK
BILIRUBIN UA: NEGATIVE
Blood, UA: NEGATIVE
Glucose, UA: 500
KETONES UA: NEGATIVE
Leukocytes, UA: NEGATIVE
Nitrite, UA: NEGATIVE
Protein, UA: NEGATIVE
Spec Grav, UA: <=1.005
UROBILINOGEN UA: 0.2
pH, UA: 5.5

## 2015-03-16 MED ORDER — SULFAMETHOXAZOLE-TRIMETHOPRIM 800-160 MG PO TABS: 1.0000 | ORAL_TABLET | Freq: Two times a day (BID) | ORAL | Status: DC

## 2015-03-16 MED ORDER — INSULIN GLARGINE 100 UNIT/ML ~~LOC~~ SOLN: 25.0000 [IU] | Freq: Every day | SUBCUTANEOUS | Status: DC

## 2015-03-16 MED ORDER — INSULIN ASPART 100 UNIT/ML ~~LOC~~ SOLN
20.0000 [IU] | Freq: Once | SUBCUTANEOUS | Status: AC
  Administered 2015-03-16: 20 [IU] via SUBCUTANEOUS

## 2015-03-16 NOTE — Progress Notes (Signed)
MRN: [id number] Name: Jared Murray  Sex: male Age: 39 y.o. DOB: 07-Sep-1975  Allergies: Penicillins  Chief Complaint  Patient presents with  . Hospitalization Follow-up    HPI: Patient is 39 y.o. male who has history of diabetes, 2 weeks ago patient went to the emergency room at that time his blood sugar was elevated, along with that he also had a small abscess on his chest which was drained and subsequently patient was started on antibiotic clindamycin for cellulitis as per patient he did not completed the course of antibiotic because he thought it was making his skin more dry,  denies any fever chills but has some pain and redness at the site, as per patient he has not taken the Lantus last night and needs the refill, has been taking his NovoLog on sliding scale today's blood sugar is elevated, denies any headache dizziness chest and shortness of breath has been her, denies any polyuria polydipsia symptoms, is given IV fluids, insulin, subsequently blood sugar improved.  Past Medical History  Diagnosis Date  . Eczema   . Type 1 diabetes mellitus   . Psoriasis   . GERD (gastroesophageal reflux disease)     Past Surgical History  Procedure Laterality Date  . Finger surgery        Medication List       This list is accurate as of: 03/16/15  2:20 PM.  Always use your most recent med list.               clobetasol cream 0.05 %  Commonly known as:  TEMOVATE  Apply 1 application topically 2 (two) times daily.     insulin glargine 100 UNIT/ML injection  Commonly known as:  LANTUS  Inject 0.25 mLs (25 Units total) into the skin daily.     Insulin Syringe-Needle U-100 29G X 1/2" 2 ML Misc  Commonly known as:  B-D INS SYRINGE 2CC/29GX1/2"  Check Blood sugar TID and QHS     lisinopril 2.5 MG tablet  Commonly known as:  PRINIVIL,ZESTRIL  Take 2 tablets (5 mg total) by mouth daily.     methocarbamol 500 MG tablet  Commonly known as:  ROBAXIN  Take 1 tablet (500 mg  total) by mouth 2 (two) times daily.     naproxen 375 MG tablet  Commonly known as:  NAPROSYN  Take 1 tablet (375 mg total) by mouth 2 (two) times daily.     NOVOLOG 100 UNIT/ML injection  Generic drug:  insulin aspart  Sliding scale CBG 70 - 120: 0 units CBG 121 - 150: 1 unit,  CBG 151 - 200: 2 units,  CBG 201 - 250: 3 units,  CBG 251 - 300: 5 units,  CBG 301 - 350: 7 units,  CBG 351 - 400: 9 units   CBG > 400: 10 units     pantoprazole 40 MG tablet  Commonly known as:  PROTONIX  Take 1 tablet (40 mg total) by mouth daily.     promethazine 12.5 MG tablet  Commonly known as:  PHENERGAN  Take 1 tablet (12.5 mg total) by mouth every 6 (six) hours as needed for nausea or vomiting.     sulfamethoxazole-trimethoprim 800-160 MG per tablet  Commonly known as:  BACTRIM DS,SEPTRA DS  Take 1 tablet by mouth 2 (two) times daily.     traMADol 50 MG tablet  Commonly known as:  ULTRAM  Take 1 tablet (50 mg total) by mouth every 8 (eight) hours  as needed.     triamcinolone cream 0.1 %  Commonly known as:  KENALOG  Apply 1 application topically 2 (two) times daily.        Meds ordered this encounter  Medications  . insulin aspart (novoLOG) injection 20 Units    Sig:   . insulin glargine (LANTUS) 100 UNIT/ML injection    Sig: Inject 0.25 mLs (25 Units total) into the skin daily.    Dispense:  10 mL    Refill:  2  . sulfamethoxazole-trimethoprim (BACTRIM DS,SEPTRA DS) 800-160 MG per tablet    Sig: Take 1 tablet by mouth 2 (two) times daily.    Dispense:  20 tablet    Refill:  0    Immunization History  Administered Date(s) Administered  . Pneumococcal Polysaccharide-23 03/18/2013, 01/26/2014    Family History  Problem Relation Age of Onset  . Hypertension Father   . Diabetes      multiple  . Lupus Cousin   . Stroke Maternal Grandmother   . Stroke Paternal Grandmother     History  Substance Use Topics  . Smoking status: Current Some Day Smoker    Types: Cigarettes  .  Smokeless tobacco: Not on file     Comment: Smoking 5 cigs/week  . Alcohol Use: 0.0 oz/week    0 Standard drinks or equivalent per week     Comment: ocassional    Review of Systems   As noted in HPI  Filed Vitals:   03/16/15 1206  BP: 107/73  Pulse: 97  Temp: 98.3 F (36.8 C)  Resp: 18    Physical Exam  Physical Exam  Constitutional: No distress.  Eyes: EOM are normal. Pupils are equal, round, and reactive to light.  Cardiovascular: Normal rate and regular rhythm.   Pulmonary/Chest: Breath sounds normal. No respiratory distress. He has no wheezes. He has no rales.  Localized redness and swelling/lump on  chest wall minimal tender no apparent skin discharge    Labs   Lab Results  Component Value Date   WBC 8.6 03/03/2015   HGB 11.6* 03/03/2015   HCT 34.0* 03/03/2015   PLT 535* 03/03/2015   GLUCOSE 235* 03/03/2015   ALT <8 02/22/2015   AST 16 02/22/2015   NA 136 03/03/2015   K 3.7 03/03/2015   CL 98* 03/03/2015   CREATININE 0.50* 03/03/2015   BUN 9 03/03/2015   CO2 18* 03/03/2015   TSH 0.700 02/22/2015   INR 1.19 01/22/2015   HGBA1C 14.3* 01/22/2015    Lab Results  Component Value Date   HGBA1C 14.3* 01/22/2015   HGBA1C 13.4* 10/12/2014   HGBA1C 8.6 08/04/2014     Assessment and Plan  Type II diabetes mellitus with complication, uncontrolled - Plan:  Results for orders placed or performed in visit on 03/16/15  Glucose (CBG)  Result Value Ref Range   POC Glucose HI 70 - 99 mg/dl  Urinalysis Dipstick  Result Value Ref Range   Color, UA yellow    Clarity, UA clear    Glucose, UA 500    Bilirubin, UA neg    Ketones, UA neg    Spec Grav, UA <=1.005    Blood, UA neg    pH, UA 5.5    Protein, UA neg    Urobilinogen, UA 0.2    Nitrite, UA neg    Leukocytes, UA Negative Negative  Glucose (CBG)  Result Value Ref Range   POC Glucose 330 (A) 70 - 99 mg/dl   Patient has  hypoglycemic, urine ketones are negative, resume back on Lantus, continue  with NovoLog sliding scale, keep the fingerstick log patient will come back in 2 weeks per nurse visit CBG check. , insulin glargine (LANTUS) 100 UNIT/ML injection  Elevated blood sugar - Plan: insulin aspart (novoLOG) injection 20 Units, Urinalysis Dipstick, Insert peripheral IV, Insert peripheral IV, Glucose (CBG)  Cellulitis of chest wall  I Have started patient on Bactrim , will come back in 2 weeks, advise patient to get immediate medical attention if she has any worsening of symptoms or develop new symptoms, he understand and verbalized instructions.    Return for CBG check/ cellulitis check  in 2 weeks/Nurse Visit.   This note has been created with Surveyor, quantity. Any transcriptional errors are unintentional.    Lorayne Marek, MD

## 2015-03-16 NOTE — Progress Notes (Signed)
Patient here for cellulitis. Patient has knot mid chest that is red and swollen.  Patient reports no pain at this time. Only hurts when touched. Patient was given clindamycin at ED. Patient stopped taking medication 2 days ago, patient thinks he's allergic to it.   Current blood sugar is HI, last took Novolog 7 units this morning.  Fasting blood sugar this morning 317.  Patient thinks he's allergic to clindamycin.   Need refills on Lantus.

## 2015-03-16 NOTE — Patient Instructions (Signed)
Diabetes Mellitus and Food It is important for you to manage your blood sugar (glucose) level. Your blood glucose level can be greatly affected by what you eat. Eating healthier foods in the appropriate amounts throughout the day at about the same time each day will help you control your blood glucose level. It can also help slow or prevent worsening of your diabetes mellitus. Healthy eating may even help you improve the level of your blood pressure and reach or maintain a healthy weight.  HOW CAN FOOD AFFECT ME? Carbohydrates Carbohydrates affect your blood glucose level more than any other type of food. Your dietitian will help you determine how many carbohydrates to eat at each meal and teach you how to count carbohydrates. Counting carbohydrates is important to keep your blood glucose at a healthy level, especially if you are using insulin or taking certain medicines for diabetes mellitus. Alcohol Alcohol can cause sudden decreases in blood glucose (hypoglycemia), especially if you use insulin or take certain medicines for diabetes mellitus. Hypoglycemia can be a life-threatening condition. Symptoms of hypoglycemia (sleepiness, dizziness, and disorientation) are similar to symptoms of having too much alcohol.  If your health care provider has given you approval to drink alcohol, do so in moderation and use the following guidelines:  Women should not have more than one drink per day, and men should not have more than two drinks per day. One drink is equal to:  12 oz of beer.  5 oz of wine.  1 oz of hard liquor.  Do not drink on an empty stomach.  Keep yourself hydrated. Have water, diet soda, or unsweetened iced tea.  Regular soda, juice, and other mixers might contain a lot of carbohydrates and should be counted. WHAT FOODS ARE NOT RECOMMENDED? As you make food choices, it is important to remember that all foods are not the same. Some foods have fewer nutrients per serving than other  foods, even though they might have the same number of calories or carbohydrates. It is difficult to get your body what it needs when you eat foods with fewer nutrients. Examples of foods that you should avoid that are high in calories and carbohydrates but low in nutrients include:  Trans fats (most processed foods list trans fats on the Nutrition Facts label).  Regular soda.  Juice.  Candy.  Sweets, such as cake, pie, doughnuts, and cookies.  Fried foods. WHAT FOODS CAN I EAT? Have nutrient-rich foods, which will nourish your body and keep you healthy. The food you should eat also will depend on several factors, including:  The calories you need.  The medicines you take.  Your weight.  Your blood glucose level.  Your blood pressure level.  Your cholesterol level. You also should eat a variety of foods, including:  Protein, such as meat, poultry, fish, tofu, nuts, and seeds (lean animal proteins are best).  Fruits.  Vegetables.  Dairy products, such as milk, cheese, and yogurt (low fat is best).  Breads, grains, pasta, cereal, rice, and beans.  Fats such as olive oil, trans fat-free margarine, canola oil, avocado, and olives. DOES EVERYONE WITH DIABETES MELLITUS HAVE THE SAME MEAL PLAN? Because every person with diabetes mellitus is different, there is not one meal plan that works for everyone. It is very important that you meet with a dietitian who will help you create a meal plan that is just right for you. Document Released: 05/01/2005 Document Revised: 08/09/2013 Document Reviewed: 07/01/2013 Exit[employer] Patient Information 2015 [employer]. This   information is not intended to replace advice given to you by your health care provider. Make sure you discuss any questions you have with your health care provider.  

## 2015-03-23 NOTE — Telephone Encounter (Signed)
[facility] called to check on the status of paperwork that were dropped off . Please f/u at phone number [phone]

## 2015-04-03 ENCOUNTER — Inpatient Hospital Stay (HOSPITAL_COMMUNITY)
Admission: EM | Admit: 2015-04-03 | Discharge: 2015-04-05 | DRG: 871 | Disposition: A | Discharge status: Home / Self Care | Payer: Medicaid Other | Attending: Internal Medicine | Admitting: Internal Medicine

## 2015-04-03 ENCOUNTER — Encounter (HOSPITAL_COMMUNITY): Payer: Self-pay | Admitting: Emergency Medicine

## 2015-04-03 ENCOUNTER — Emergency Department (HOSPITAL_COMMUNITY): Payer: Medicaid Other

## 2015-04-03 DIAGNOSIS — F1010 Alcohol abuse, uncomplicated: Secondary | ICD-10-CM | POA: Diagnosis present

## 2015-04-03 DIAGNOSIS — N179 Acute kidney failure, unspecified: Secondary | ICD-10-CM

## 2015-04-03 DIAGNOSIS — E1010 Type 1 diabetes mellitus with ketoacidosis without coma: Secondary | ICD-10-CM

## 2015-04-03 DIAGNOSIS — Z833 Family history of diabetes mellitus: Secondary | ICD-10-CM | POA: Diagnosis not present

## 2015-04-03 DIAGNOSIS — Z681 Body mass index (BMI) 19 or less, adult: Secondary | ICD-10-CM | POA: Diagnosis not present

## 2015-04-03 DIAGNOSIS — Z794 Long term (current) use of insulin: Secondary | ICD-10-CM

## 2015-04-03 DIAGNOSIS — A419 Sepsis, unspecified organism: Secondary | ICD-10-CM | POA: Diagnosis present

## 2015-04-03 DIAGNOSIS — E860 Dehydration: Secondary | ICD-10-CM | POA: Diagnosis present

## 2015-04-03 DIAGNOSIS — L309 Dermatitis, unspecified: Secondary | ICD-10-CM | POA: Diagnosis present

## 2015-04-03 DIAGNOSIS — L02213 Cutaneous abscess of chest wall: Secondary | ICD-10-CM

## 2015-04-03 DIAGNOSIS — E43 Unspecified severe protein-calorie malnutrition: Secondary | ICD-10-CM | POA: Diagnosis present

## 2015-04-03 DIAGNOSIS — F17210 Nicotine dependence, cigarettes, uncomplicated: Secondary | ICD-10-CM | POA: Diagnosis present

## 2015-04-03 DIAGNOSIS — E1065 Type 1 diabetes mellitus with hyperglycemia: Secondary | ICD-10-CM | POA: Diagnosis present

## 2015-04-03 DIAGNOSIS — I4510 Unspecified right bundle-branch block: Secondary | ICD-10-CM | POA: Diagnosis present

## 2015-04-03 DIAGNOSIS — E1165 Type 2 diabetes mellitus with hyperglycemia: Secondary | ICD-10-CM

## 2015-04-03 DIAGNOSIS — Z880 Allergy status to penicillin: Secondary | ICD-10-CM | POA: Diagnosis not present

## 2015-04-03 DIAGNOSIS — K219 Gastro-esophageal reflux disease without esophagitis: Secondary | ICD-10-CM | POA: Diagnosis present

## 2015-04-03 DIAGNOSIS — IMO0002 Reserved for concepts with insufficient information to code with codable children: Secondary | ICD-10-CM | POA: Diagnosis present

## 2015-04-03 DIAGNOSIS — L409 Psoriasis, unspecified: Secondary | ICD-10-CM | POA: Diagnosis present

## 2015-04-03 DIAGNOSIS — E875 Hyperkalemia: Secondary | ICD-10-CM | POA: Diagnosis present

## 2015-04-03 DIAGNOSIS — Z79899 Other long term (current) drug therapy: Secondary | ICD-10-CM

## 2015-04-03 DIAGNOSIS — E118 Type 2 diabetes mellitus with unspecified complications: Secondary | ICD-10-CM

## 2015-04-03 HISTORY — DX: Cutaneous abscess of chest wall: L02.213

## 2015-04-03 HISTORY — DX: Type 1 diabetes mellitus with ketoacidosis without coma: E10.10

## 2015-04-03 LAB — BASIC METABOLIC PANEL
Anion gap: 13 (ref 5–15)
Anion gap: 9 (ref 5–15)
BUN: 21 mg/dL — ABNORMAL HIGH (ref 6–20)
BUN: 15 mg/dL (ref 6–20)
BUN: 14 mg/dL (ref 6–20)
CHLORIDE: 106 mmol/L (ref 101–111)
CO2: 14 mmol/L — ABNORMAL LOW (ref 22–32)
CO2: 19 mmol/L — ABNORMAL LOW (ref 22–32)
CREATININE: 1.96 mg/dL — AB (ref 0.61–1.24)
CREATININE: 1.37 mg/dL — AB (ref 0.61–1.24)
CREATININE: 0.99 mg/dL (ref 0.61–1.24)
Calcium: 7.9 mg/dL — ABNORMAL LOW (ref 8.9–10.3)
Calcium: 8.2 mg/dL — ABNORMAL LOW (ref 8.9–10.3)
Calcium: 8.1 mg/dL — ABNORMAL LOW (ref 8.9–10.3)
Chloride: 113 mmol/L — ABNORMAL HIGH (ref 101–111)
Chloride: 112 mmol/L — ABNORMAL HIGH (ref 101–111)
GFR calc Af Amer: >60 mL/min (ref >60)
GFR calc Af Amer: >60 mL/min (ref >60)
GFR calc non Af Amer: 42 mL/min — ABNORMAL LOW (ref >60)
GFR, EST AFRICAN AMERICAN: 48 mL/min — AB (ref >60)
GLUCOSE: 202 mg/dL — AB (ref 65–99)
GLUCOSE: 159 mg/dL — AB (ref 65–99)
Glucose, Bld: 522 mg/dL — ABNORMAL HIGH (ref 65–99)
POTASSIUM: 5.3 mmol/L — AB (ref 3.5–5.1)
POTASSIUM: 3.9 mmol/L (ref 3.5–5.1)
Potassium: 4.2 mmol/L (ref 3.5–5.1)
SODIUM: 138 mmol/L (ref 135–145)
SODIUM: 140 mmol/L (ref 135–145)
SODIUM: 140 mmol/L (ref 135–145)

## 2015-04-03 LAB — URINALYSIS, ROUTINE W REFLEX MICROSCOPIC
BILIRUBIN URINE: NEGATIVE
Glucose, UA: >1000 mg/dL — AB
Leukocytes, UA: NEGATIVE
NITRITE: NEGATIVE
PH: 5 (ref 5.0–8.0)
Protein, ur: 30 mg/dL — AB
SPECIFIC GRAVITY, URINE: 1.022 (ref 1.005–1.030)
UROBILINOGEN UA: 0.2 mg/dL (ref 0.0–1.0)

## 2015-04-03 LAB — CBC WITH DIFFERENTIAL/PLATELET
Basophils Absolute: 0 10*3/uL (ref 0.0–0.1)
Basophils Relative: 0 % (ref 0–1)
EOS PCT: 0 % (ref 0–5)
Eosinophils Absolute: 0 10*3/uL (ref 0.0–0.7)
HCT: 42.1 % (ref 39.0–52.0)
Hemoglobin: 13.7 g/dL (ref 13.0–17.0)
LYMPHS PCT: 8 % — AB (ref 12–46)
Lymphs Abs: 1.8 10*3/uL (ref 0.7–4.0)
MCH: 31.9 pg (ref 26.0–34.0)
MCHC: 32.5 g/dL (ref 30.0–36.0)
MCV: 97.9 fL (ref 78.0–100.0)
MONOS PCT: 6 % (ref 3–12)
Monocytes Absolute: 1.3 10*3/uL — ABNORMAL HIGH (ref 0.1–1.0)
NEUTROS ABS: 19.6 10*3/uL — AB (ref 1.7–7.7)
Neutrophils Relative %: 86 % — ABNORMAL HIGH (ref 43–77)
PLATELETS: 353 10*3/uL (ref 150–400)
RBC: 4.3 MIL/uL (ref 4.22–5.81)
RDW: 13.5 % (ref 11.5–15.5)
WBC: 22.7 10*3/uL — AB (ref 4.0–10.5)

## 2015-04-03 LAB — I-STAT VENOUS BLOOD GAS, ED
ACID-BASE DEFICIT: 28 mmol/L — AB (ref 0.0–2.0)
BICARBONATE: 3.2 meq/L — AB (ref 20.0–24.0)
O2 SAT: 87 %
PO2 VEN: 85 mmHg — AB (ref 30.0–45.0)
pCO2, Ven: 16 mmHg — ABNORMAL LOW (ref 45.0–50.0)
pH, Ven: 6.91 — CL (ref 7.250–7.300)

## 2015-04-03 LAB — PROTIME-INR
INR: 1.04 (ref 0.00–1.49)
PROTHROMBIN TIME: 13.8 s (ref 11.6–15.2)

## 2015-04-03 LAB — CBG MONITORING, ED
GLUCOSE-CAPILLARY: 553 mg/dL — AB (ref 65–99)
GLUCOSE-CAPILLARY: 255 mg/dL — AB (ref 65–99)
Glucose-Capillary: 583 mg/dL (ref 65–99)
Glucose-Capillary: 537 mg/dL — ABNORMAL HIGH (ref 65–99)
Glucose-Capillary: 486 mg/dL — ABNORMAL HIGH (ref 65–99)
Glucose-Capillary: 393 mg/dL — ABNORMAL HIGH (ref 65–99)

## 2015-04-03 LAB — MRSA PCR SCREENING: MRSA by PCR: NEGATIVE

## 2015-04-03 LAB — URINE MICROSCOPIC-ADD ON

## 2015-04-03 LAB — APTT: aPTT: 24 seconds (ref 24–37)

## 2015-04-03 LAB — GLUCOSE, CAPILLARY
GLUCOSE-CAPILLARY: 214 mg/dL — AB (ref 65–99)
GLUCOSE-CAPILLARY: 198 mg/dL — AB (ref 65–99)
Glucose-Capillary: 151 mg/dL — ABNORMAL HIGH (ref 65–99)
Glucose-Capillary: 164 mg/dL — ABNORMAL HIGH (ref 65–99)
Glucose-Capillary: 186 mg/dL — ABNORMAL HIGH (ref 65–99)
Glucose-Capillary: 98 mg/dL (ref 65–99)

## 2015-04-03 LAB — LACTIC ACID, PLASMA
Lactic Acid, Venous: 1.9 mmol/L (ref 0.5–2.0)
Lactic Acid, Venous: 1.5 mmol/L (ref 0.5–2.0)

## 2015-04-03 LAB — PROCALCITONIN: PROCALCITONIN: 0.47 ng/mL

## 2015-04-03 MED ORDER — SODIUM CHLORIDE 0.9 % IV SOLN
1000.0000 mL | Freq: Once | INTRAVENOUS | Status: AC
  Administered 2015-04-03: 1000 mL via INTRAVENOUS

## 2015-04-03 MED ORDER — SODIUM CHLORIDE 0.9 % IV SOLN: INTRAVENOUS | Status: DC

## 2015-04-03 MED ORDER — ONDANSETRON HCL 4 MG/2ML IJ SOLN
4.0000 mg | Freq: Four times a day (QID) | INTRAMUSCULAR | Status: DC | PRN
  Administered 2015-04-03: 4 mg via INTRAVENOUS
  Filled 2015-04-03: qty 2

## 2015-04-03 MED ORDER — PANTOPRAZOLE SODIUM 40 MG IV SOLR
40.0000 mg | INTRAVENOUS | Status: DC
  Administered 2015-04-03: 40 mg via INTRAVENOUS
  Filled 2015-04-03 (×2): qty 40

## 2015-04-03 MED ORDER — CLOBETASOL PROPIONATE 0.05 % EX CREA
1.0000 "application " | TOPICAL_CREAM | Freq: Every day | CUTANEOUS | Status: DC
  Administered 2015-04-04 – 2015-04-05 (×2): 1 via TOPICAL
  Filled 2015-04-03: qty 15

## 2015-04-03 MED ORDER — DEXTROSE-NACL 5-0.45 % IV SOLN: INTRAVENOUS | Status: DC

## 2015-04-03 MED ORDER — LORAZEPAM 2 MG/ML IJ SOLN: 1.0000 mg | Freq: Four times a day (QID) | INTRAMUSCULAR | Status: DC | PRN

## 2015-04-03 MED ORDER — SODIUM CHLORIDE 0.9 % IV SOLN
INTRAVENOUS | Status: DC
  Administered 2015-04-03: 5.4 [IU]/h via INTRAVENOUS
  Filled 2015-04-03: qty 2.5

## 2015-04-03 MED ORDER — DEXTROSE 50 % IV SOLN: 50.0000 mL | Freq: Once | INTRAVENOUS | Status: DC

## 2015-04-03 MED ORDER — DEXTROSE 5 % IV SOLN
2.0000 g | Freq: Once | INTRAVENOUS | Status: AC
  Administered 2015-04-03: 2 g via INTRAVENOUS
  Filled 2015-04-03: qty 2

## 2015-04-03 MED ORDER — LIDOCAINE-EPINEPHRINE (PF) 2 %-1:200000 IJ SOLN
10.0000 mL | Freq: Once | INTRAMUSCULAR | Status: AC
  Administered 2015-04-03: 10 mL via INTRADERMAL
  Filled 2015-04-03: qty 20

## 2015-04-03 MED ORDER — METRONIDAZOLE IN NACL 5-0.79 MG/ML-% IV SOLN
500.0000 mg | Freq: Three times a day (TID) | INTRAVENOUS | Status: DC
  Administered 2015-04-04 (×2): 500 mg via INTRAVENOUS
  Filled 2015-04-03 (×4): qty 100

## 2015-04-03 MED ORDER — METRONIDAZOLE IN NACL 5-0.79 MG/ML-% IV SOLN
500.0000 mg | Freq: Once | INTRAVENOUS | Status: AC
  Administered 2015-04-03: 500 mg via INTRAVENOUS
  Filled 2015-04-03: qty 100

## 2015-04-03 MED ORDER — SODIUM CHLORIDE 0.9 % IV SOLN
1250.0000 mg | INTRAVENOUS | Status: DC
  Administered 2015-04-04: 1250 mg via INTRAVENOUS
  Filled 2015-04-03: qty 1250

## 2015-04-03 MED ORDER — LORAZEPAM 1 MG PO TABS: 1.0000 mg | ORAL_TABLET | Freq: Four times a day (QID) | ORAL | Status: DC | PRN

## 2015-04-03 MED ORDER — DEXTROSE 5 % IV SOLN
1.0000 g | Freq: Three times a day (TID) | INTRAVENOUS | Status: DC
  Administered 2015-04-04 (×2): 1 g via INTRAVENOUS
  Filled 2015-04-03 (×5): qty 1

## 2015-04-03 MED ORDER — THIAMINE HCL 100 MG/ML IJ SOLN
100.0000 mg | Freq: Every day | INTRAMUSCULAR | Status: DC
  Administered 2015-04-03: 100 mg via INTRAVENOUS
  Filled 2015-04-03 (×2): qty 1

## 2015-04-03 MED ORDER — VITAMIN B-1 100 MG PO TABS
100.0000 mg | ORAL_TABLET | Freq: Every day | ORAL | Status: DC
  Administered 2015-04-04 – 2015-04-05 (×2): 100 mg via ORAL
  Filled 2015-04-03 (×3): qty 1

## 2015-04-03 MED ORDER — DEXTROSE 10 % IV SOLN: INTRAVENOUS | Status: DC

## 2015-04-03 MED ORDER — CLINDAMYCIN PHOSPHATE 600 MG/50ML IV SOLN
600.0000 mg | Freq: Once | INTRAVENOUS | Status: AC
  Administered 2015-04-03: 600 mg via INTRAVENOUS
  Filled 2015-04-03: qty 50

## 2015-04-03 MED ORDER — VANCOMYCIN HCL IN DEXTROSE 1-5 GM/200ML-% IV SOLN
1000.0000 mg | Freq: Once | INTRAVENOUS | Status: AC
  Administered 2015-04-03: 1000 mg via INTRAVENOUS
  Filled 2015-04-03: qty 200

## 2015-04-03 MED ORDER — SODIUM CHLORIDE 0.9 % IV SOLN
1000.0000 mL | INTRAVENOUS | Status: DC
  Administered 2015-04-03: 1000 mL via INTRAVENOUS

## 2015-04-03 MED ORDER — ENOXAPARIN SODIUM 40 MG/0.4ML ~~LOC~~ SOLN
40.0000 mg | SUBCUTANEOUS | Status: DC
  Administered 2015-04-03 – 2015-04-04 (×2): 40 mg via SUBCUTANEOUS
  Filled 2015-04-03 (×3): qty 0.4

## 2015-04-03 MED ORDER — ADULT MULTIVITAMIN W/MINERALS CH
1.0000 | ORAL_TABLET | Freq: Every day | ORAL | Status: DC
  Administered 2015-04-03 – 2015-04-05 (×3): 1 via ORAL
  Filled 2015-04-03 (×3): qty 1

## 2015-04-03 MED ORDER — DEXTROSE-NACL 5-0.45 % IV SOLN
INTRAVENOUS | Status: DC
  Administered 2015-04-03: 19:00:00 via INTRAVENOUS

## 2015-04-03 MED ORDER — FOLIC ACID 1 MG PO TABS
1.0000 mg | ORAL_TABLET | Freq: Every day | ORAL | Status: DC
  Administered 2015-04-03 – 2015-04-05 (×3): 1 mg via ORAL
  Filled 2015-04-03 (×3): qty 1

## 2015-04-03 NOTE — Progress Notes (Signed)
1840: Pt admitted to 3s16, VSS. Insulin gtt infusing, NS@200ml . Chest abscess dressing covered, small amount of oozing noted. Wound culture pending, awaiting proper swab. Will continue to monitor.

## 2015-04-03 NOTE — ED Notes (Signed)
RN unable to obtain labs. Notified phlebotomy

## 2015-04-03 NOTE — ED Notes (Signed)
CBG 553 

## 2015-04-03 NOTE — ED Provider Notes (Signed)
CSN: [id number]     Arrival date & time 04/03/15  1048 History   First MD Initiated Contact with Patient 04/03/15 1049     Chief Complaint  Patient presents with  . Hyperglycemia     (Consider location/radiation/quality/duration/timing/severity/associated sxs/prior Treatment) HPI   39 year old male with history of type 1 diabetes brought here to the ER for evaluation of abdominal pain. History is difficult as patient appears very uncomfortable. Unknown type I diabetic patient who is been seen in the ED multiple times for DKA in the past. Been having abdominal pain with associate nausea and vomiting including polyuria and polydipsia with lethargy and chest discomfort. He has a house as noted to his anterior chest wall which he does complain of pain and states that it has been there "for a while". He has been taking his insulin. History is limited due to his discomfort.  Past Medical History  Diagnosis Date  . Eczema   . Type 1 diabetes mellitus   . Psoriasis   . GERD (gastroesophageal reflux disease)    Past Surgical History  Procedure Laterality Date  . Finger surgery     Family History  Problem Relation Age of Onset  . Hypertension Father   . Diabetes      multiple  . Lupus Cousin   . Stroke Maternal Grandmother   . Stroke Paternal Grandmother    Social History  Substance Use Topics  . Smoking status: Current Some Day Smoker    Types: Cigarettes  . Smokeless tobacco: Not on file     Comment: Smoking 5 cigs/week  . Alcohol Use: 0.0 oz/week    0 Standard drinks or equivalent per week     Comment: ocassional    Review of Systems  Unable to perform ROS: Mental status change      Allergies  Penicillins  Home Medications   Prior to Admission medications   Medication Sig Start Date End Date Taking? Authorizing Provider  clobetasol cream (TEMOVATE) 5.32 % Apply 1 application topically 2 (two) times daily. Patient taking differently: Apply 1 application topically  daily.  08/04/14   Lorayne Marek, MD  insulin glargine (LANTUS) 100 UNIT/ML injection Inject 0.25 mLs (25 Units total) into the skin daily. 03/16/15   Lorayne Marek, MD  Insulin Syringe-Needle U-100 (B-D INS SYRINGE 2CC/29GX1/2") 29G X 1/2" 2 ML MISC Check Blood sugar TID and QHS 03/22/14   Olugbemiga E Doreene Burke, MD  lisinopril (PRINIVIL,ZESTRIL) 2.5 MG tablet Take 2 tablets (5 mg total) by mouth daily. Patient not taking: Reported on 02/22/2015 01/29/15   [name], MD  methocarbamol (ROBAXIN) 500 MG tablet Take 1 tablet (500 mg total) by mouth 2 (two) times daily. Patient not taking: Reported on 03/16/2015 02/17/15   April Palumbo, MD  naproxen (NAPROSYN) 375 MG tablet Take 1 tablet (375 mg total) by mouth 2 (two) times daily. Patient not taking: Reported on 03/16/2015 02/17/15   April Palumbo, MD  NOVOLOG 100 UNIT/ML injection Sliding scale CBG 70 - 120: 0 units CBG 121 - 150: 1 unit,  CBG 151 - 200: 2 units,  CBG 201 - 250: 3 units,  CBG 251 - 300: 5 units,  CBG 301 - 350: 7 units,  CBG 351 - 400: 9 units   CBG > 400: 10 units Patient taking differently: Inject 1-10 Units into the skin 3 (three) times daily with meals. Sliding scale CBG 70 - 120: 0 units CBG 121 - 150: 1 unit,  CBG 151 - 200: 2 units,  CBG 201 - 250: 3 units,  CBG 251 - 300: 5 units,  CBG 301 - 350: 7 units,  CBG 351 - 400: 9 units   CBG > 400: 10 units 10/12/14   Ripudeep K Rai, MD  pantoprazole (PROTONIX) 40 MG tablet Take 1 tablet (40 mg total) by mouth daily. 01/29/15   [name], MD  promethazine (PHENERGAN) 12.5 MG tablet Take 1 tablet (12.5 mg total) by mouth every 6 (six) hours as needed for nausea or vomiting. 10/12/14   Ripudeep Krystal Eaton, MD  sulfamethoxazole-trimethoprim (BACTRIM DS,SEPTRA DS) 800-160 MG per tablet Take 1 tablet by mouth 2 (two) times daily. 03/16/15   Lorayne Marek, MD  traMADol (ULTRAM) 50 MG tablet Take 1 tablet (50 mg total) by mouth every 8 (eight) hours as needed. 02/12/15   [name], MD  triamcinolone cream  (KENALOG) 0.1 % Apply 1 application topically 2 (two) times daily. 02/22/15   Lorayne Marek, MD   There were no vitals taken for this visit. Physical Exam  Constitutional: He appears well-developed and well-nourished. No distress.  Frail-appearing African-American male, in distress.  HENT:  Head: Atraumatic.  Mouth is dry.  Eyes: Conjunctivae are normal.  Neck: Neck supple.  No nuchal rigidity.  Cardiovascular:  Tachycardia, no murmurs rubs or gallops.  Pulmonary/Chest: He exhibits tenderness (Cutaneous abscess measuring approximately 3 cm in diameter noted to anterior chest adjacent to the xyphoid process).  Kussmal breathing.  Abdominal: Soft. He exhibits no distension. There is tenderness (Mild diffuse abdominal tenderness.).  Neurological: He is alert.  Patient is drowsy, decreased mental alertness, protecting airway. Able to move all 4 extremities  Skin: No rash (psoriatic skin changes noted throughout body most significant in abdomen, bilateral lower legs and feet, and arms) noted.  Psychiatric: He has a normal mood and affect.  Nursing note and vitals reviewed.   ED Course  Procedures (including critical care time)  Pt presents with sxs consistent with DKA.  He is uncomfortable, is lethargic and also c/o abd cramping and chest discomfort.  Pt has a cutaneous abscess to mid chest that will require I&D.  He will benefit from a DKA work up.  IVF and glucostabilizer started.   12:31 PM PH 6.9, reflecting metabolic acidosis from DKA  Potassium is 6.7, no EKG changes.  CBG >700.  Pt receive appropriate IVF bolus.  CXR, UA, labs ordered.  Will monitor closely.  Care discussed with dr. Hillard Danker  2:24 PM Patient became more alert after receiving IV fluid and insulin. He had a cutaneous abscess to his mid chest likely due to either an infected sebaceous cysts versus an infected lipoma. He mentioned that it has been ongoing for the past several weeks.  Currently UA shows greater than 80  ketones consistence with DKA but no signs of urinary tract infection. Chest x-ray without focal infiltrate concerning for pneumonia. I suspect the cutaneous chest abscess may contribute to worsening DKA.  INCISION AND DRAINAGE Performed by: Domenic Moras Consent: Verbal consent obtained. Risks and benefits: risks, benefits and alternatives were discussed Type: abscess  Body area: anterior mid chest, overlying xyphoid process  Anesthesia: local infiltration  Incision was made with a scalpel.  Local anesthetic: lidocaine 2% w epinephrine  Anesthetic total: 3 ml  Complexity: complex Blunt dissection to break up loculations  Drainage: purulent  Drainage amount: small  Packing material: 1/4 in iodoform gauze  Patient tolerance: Patient tolerated the procedure well with no immediate complications.  2:46 PM Pt is hypothermic, will place  pt on Bear Hugger warming blanket.  Will also give pt clindamycin for cellulitis/abscess.    4:36 PM Appreciate consultation from [facility] Dr. Algis Liming who agrees to admit pt to step down, under his care.    CRITICAL CARE Performed by: Domenic Moras Total critical care time: 1 hr Critical care time was exclusive of separately billable procedures and treating other patients. Critical care was necessary to treat or prevent imminent or life-threatening deterioration. Critical care was time spent personally by me on the following activities: development of treatment plan with patient and/or surrogate as well as nursing, discussions with consultants, evaluation of patient's response to treatment, examination of patient, obtaining history from patient or surrogate, ordering and performing treatments and interventions, ordering and review of laboratory studies, ordering and review of radiographic studies, pulse oximetry and re-evaluation of patient's condition.   Labs Review Labs Reviewed  CBC WITH DIFFERENTIAL/PLATELET - Abnormal; Notable for the  following:    WBC 22.7 (*)    Neutro Abs 19.6 (*)    Monocytes Absolute 1.3 (*)    Neutrophils Relative % 86 (*)    Lymphocytes Relative 8 (*)    All other components within normal limits  URINALYSIS, ROUTINE W REFLEX MICROSCOPIC (NOT AT [facility]) - Abnormal; Notable for the following:    Glucose, UA >1000 (*)    Hgb urine dipstick TRACE (*)    Ketones, ur >80 (*)    Protein, ur 30 (*)    All other components within normal limits  BASIC METABOLIC PANEL - Abnormal; Notable for the following:    Potassium 5.3 (*)    CO2 <5 (*)    Glucose, Bld 522 (*)    BUN 21 (*)    Creatinine, Ser 1.96 (*)    Calcium 7.9 (*)    GFR calc non Af Amer 42 (*)    GFR calc Af Amer 48 (*)    All other components within normal limits  URINE MICROSCOPIC-ADD ON - Abnormal; Notable for the following:    Squamous Epithelial / LPF FEW (*)    Bacteria, UA FEW (*)    Casts GRANULAR CAST (*)    All other components within normal limits  CBG MONITORING, ED - Abnormal; Notable for the following:    Glucose-Capillary 553 (*)    All other components within normal limits  CBG MONITORING, ED - Abnormal; Notable for the following:    Glucose-Capillary 583 (*)    All other components within normal limits  CBG MONITORING, ED - Abnormal; Notable for the following:    Glucose-Capillary >600 (*)    All other components within normal limits  I-STAT VENOUS BLOOD GAS, ED - Abnormal; Notable for the following:    pH, Ven 6.910 (*)    pCO2, Ven 16.0 (*)    pO2, Ven 85.0 (*)    Bicarbonate 3.2 (*)    Acid-base deficit 28.0 (*)    All other components within normal limits  CBG MONITORING, ED - Abnormal; Notable for the following:    Glucose-Capillary 537 (*)    All other components within normal limits  CBG MONITORING, ED - Abnormal; Notable for the following:    Glucose-Capillary 486 (*)    All other components within normal limits  URINE CULTURE  BLOOD GAS, VENOUS  I-STAT CHEM 8, ED    Imaging Review Dg Chest  Portable 1 View  04/03/2015   CLINICAL DATA:  Chest pain, abdominal pain, nausea, vomiting.  EXAM: PORTABLE CHEST - 1 VIEW  COMPARISON:  02/17/2015  FINDINGS: The heart size and mediastinal contours are within normal limits. Both lungs are clear. The visualized skeletal structures are unremarkable.  IMPRESSION: No active disease.   Electronically Signed   By: Rolm Baptise M.D.   On: 04/03/2015 11:58   I have personally reviewed and evaluated these images and lab results as part of my medical decision-making.   EKG Interpretation   Date/Time:  Tuesday April 03 2015 10:57:30 EDT Ventricular Rate:  108 PR Interval:  151 QRS Duration: 149 QT Interval:  387 QTC Calculation: 519 R Axis:   -110 Text Interpretation:  Sinus tachycardia Biatrial enlargement RBBB and LAFB  Since last tracing rate faster Confirmed by KNAPP  MD-J, JON ([id number]) on  04/03/2015 11:01:43 AM      MDM   Final diagnoses:  Diabetic ketoacidosis without coma associated with type 1 diabetes mellitus  Cutaneous abscess of chest wall    BP 119/71 mmHg  Pulse 103  Temp(Src) 97.4 F (36.3 C) (Oral)  Resp 16  SpO2 100%  I have reviewed nursing notes and vital signs. I personally viewed the imaging tests through PACS system and agrees with radiologist's intepretation I reviewed available ER/hospitalization records through the EMR     Domenic Moras, [place] 04/03/15 1636

## 2015-04-03 NOTE — Progress Notes (Signed)
ANTIBIOTIC CONSULT NOTE - INITIAL  Pharmacy Consult for vancomycin + aztreonam + flagyl Indication: cellulitis  Allergies  Allergen Reactions  . Penicillins Other (See Comments)    Childhood allergy    Patient Measurements:   Adjusted Body Weight:   Vital Signs: Temp: 97.1 F (36.2 C) (08/16 1744) Temp Source: Oral (08/16 1744) BP: 125/79 mmHg (08/16 1730) Pulse Rate: 102 (08/16 1730) Intake/Output from previous day:   Intake/Output from this shift: Total I/O In: 2000 [I.V.:2000] Out: -   Labs:  Recent Labs  04/03/15 1156 04/03/15 1435  WBC 22.7*  --   HGB 13.7  --   PLT 353  --   CREATININE  --  1.96*   CrCl cannot be calculated (Unknown ideal weight.). No results for input(s): VANCOTROUGH, VANCOPEAK, VANCORANDOM, GENTTROUGH, GENTPEAK, GENTRANDOM, TOBRATROUGH, TOBRAPEAK, TOBRARND, AMIKACINPEAK, AMIKACINTROU, AMIKACIN in the last 72 hours.   Microbiology: No results found for this or any previous visit (from the past 720 hour(s)).  Medical History: Past Medical History  Diagnosis Date  . Eczema   . Type 1 diabetes mellitus   . Psoriasis   . GERD (gastroesophageal reflux disease)     Medications:  Anti-infectives    Start     Dose/Rate Route Frequency Ordered Stop   04/04/15 1800  vancomycin (VANCOCIN) 1,250 mg in sodium chloride 0.9 % 250 mL IVPB     1,250 mg 166.7 mL/hr over 90 Minutes Intravenous Every 24 hours 04/03/15 1745     04/04/15 0200  aztreonam (AZACTAM) 1 g in dextrose 5 % 50 mL IVPB     1 g 100 mL/hr over 30 Minutes Intravenous Every 8 hours 04/03/15 1745     04/04/15 0200  metroNIDAZOLE (FLAGYL) IVPB 500 mg     500 mg 100 mL/hr over 60 Minutes Intravenous Every 8 hours 04/03/15 1745     04/03/15 1745  aztreonam (AZACTAM) 2 g in dextrose 5 % 50 mL IVPB     2 g 100 mL/hr over 30 Minutes Intravenous  Once 04/03/15 1734     04/03/15 1745  metroNIDAZOLE (FLAGYL) IVPB 500 mg     500 mg 100 mL/hr over 60 Minutes Intravenous  Once 04/03/15  1734     04/03/15 1745  vancomycin (VANCOCIN) IVPB 1000 mg/200 mL premix     1,000 mg 200 mL/hr over 60 Minutes Intravenous  Once 04/03/15 1734     04/03/15 1500  clindamycin (CLEOCIN) IVPB 600 mg     600 mg 100 mL/hr over 30 Minutes Intravenous  Once 04/03/15 1446 04/03/15 1526     Assessment: 69 yom presented to the ED with DKA. Also found to have cellulitis and starting broad-spectrum antibiotics. Pt is afebrile and WBC is elevated at 22.7. Scr is also elevated at 1.96. Admitting MD ordered first doses.   Vanc 8/16>> Aztreo 8/16>> Flagyl 8/16>>  Goal of Therapy:  Vancomycin trough level 10-15 mcg/ml  Plan:  - Flagyl 500mg  IV Q8H - Aztreonam 1gm IV Q8H - Vanc 1250mg  IV Q24H - F/u renal fxn, C&S, clinical status and trough at [facility] - F/u ability to de-escalate therapy  Jashun Puertas, Rande Lawman 04/03/2015,5:48 PM

## 2015-04-03 NOTE — H&P (Signed)
History and Physical  Jared Murray LZJ:[id number] DOB: 08/07/1976 DOA: 04/03/2015  Referring physician: Domenic Moras, ED PA-C PCP: Lorayne Marek, MD  Outpatient Specialists:  1. None  Chief Complaint: Abdominal pain, nausea, vomiting and chest pain at site of lump.  HPI: Jared Murray is a 39 y.o. male with history of DM 1 (as per patient diagnosed 2 years ago), recurrent DKA, psoriasis, ongoing tobacco and possible alcohol abuse, GERD, presented to [facility] ED on 04/03/15 with above complaints. Patient gives 2 days history of feeling generally weak, polyuria, polydipsia and increased thirst. Last night he started experiencing diffuse abdominal pain, rated at 5/10 in severity, nonradiating, associated with rapid breathing, nausea and a couple of episodes of nonbloody emesis but no diarrhea. Subjective fevers but no chills. He also noticed increased pain at the site of right anterior chest wall lump. He has had the lump before and treated with antibiotics following which the lump became softer and nontender without redness until sometime yesterday. Patient's wife was at work this morning and hence his kids called his sister who is a Marine scientist. EMS was called and patient was transported to ED where he was found to be in DKA and noted to have right anterior chest wall abscess. While in ED, he had transient hypothermia (resolved), tachycardia, tachypnea. Admitting labs: Potassium 5.3, bicarbonate less than 5, glucose 522, BUN 21, creatinine 1.96, WBC 22.7, chest x-ray without active disease and urine microscopy without features of urinary tract infection. Hospitalist admission was requested. Since treatment in the ED, patient is feeling significantly better.   Review of Systems: All systems reviewed and apart from history of presenting illness, are negative.  Past Medical History  Diagnosis Date  . Eczema   . Type 1 diabetes mellitus   . Psoriasis   . GERD (gastroesophageal reflux disease)     Past Surgical History  Procedure Laterality Date  . Finger surgery     Social History:  reports that he has been smoking Cigarettes.  He does not have any smokeless tobacco history on file. He reports that he drinks alcohol. He reports that he does not use illicit drugs. Married. Lives with spouse and kids. Independent of activities of daily living.  Allergies  Allergen Reactions  . Penicillins Other (See Comments)    Childhood allergy   patient unable to clarify what his penicillin allergy was. He was told by his mother that his skin condition got worse when he took penicillin.  Family History  Problem Relation Age of Onset  . Hypertension Father   . Diabetes      multiple  . Lupus Cousin   . Stroke Maternal Grandmother   . Stroke Paternal Grandmother     Prior to Admission medications   Medication Sig Start Date End Date Taking? Authorizing Provider  clobetasol cream (TEMOVATE) 0.24 % Apply 1 application topically 2 (two) times daily. Patient taking differently: Apply 1 application topically daily.  08/04/14  Yes Deepak Advani, MD  insulin glargine (LANTUS) 100 UNIT/ML injection Inject 0.25 mLs (25 Units total) into the skin daily. 03/16/15  Yes Deepak Advani, MD  NOVOLOG 100 UNIT/ML injection Sliding scale CBG 70 - 120: 0 units CBG 121 - 150: 1 unit,  CBG 151 - 200: 2 units,  CBG 201 - 250: 3 units,  CBG 251 - 300: 5 units,  CBG 301 - 350: 7 units,  CBG 351 - 400: 9 units   CBG > 400: 10 units Patient taking differently: Inject  1-10 Units into the skin 3 (three) times daily with meals. Sliding scale CBG 70 - 120: 0 units CBG 121 - 150: 1 unit,  CBG 151 - 200: 2 units,  CBG 201 - 250: 3 units,  CBG 251 - 300: 5 units,  CBG 301 - 350: 7 units,  CBG 351 - 400: 9 units   CBG > 400: 10 units 10/12/14  Yes Ripudeep K Rai, MD  pantoprazole (PROTONIX) 40 MG tablet Take 1 tablet (40 mg total) by mouth daily. 01/29/15  Yes [name], MD  traMADol (ULTRAM) 50 MG tablet Take 1 tablet (50 mg  total) by mouth every 8 (eight) hours as needed. 02/12/15  Yes [name], MD  Insulin Syringe-Needle U-100 (B-D INS SYRINGE 2CC/29GX1/2") 29G X 1/2" 2 ML MISC Check Blood sugar TID and QHS 03/22/14   Tresa Garter, MD  promethazine (PHENERGAN) 12.5 MG tablet Take 1 tablet (12.5 mg total) by mouth every 6 (six) hours as needed for nausea or vomiting. 10/12/14   Ripudeep Krystal Eaton, MD   Physical Exam: Filed Vitals:   04/03/15 1630 04/03/15 1645 04/03/15 1700 04/03/15 1715  BP: 119/75 112/71 113/72 141/79  Pulse: 109 106 104 107  Temp:      TempSrc:      Resp: 18 18 18 24   SpO2: 100% 100% 100% 100%   temperature 97.33F.   General exam: Moderately built and nourished pleasant young male patient, lying comfortably supine on the gurney in no obvious distress.  Head, eyes and ENT: Nontraumatic and normocephalic. Pupils equally reacting to light and accommodation. Oral mucosa dry. Braided hair  Neck: Supple. No JVD, carotid bruit or thyromegaly.  Lymphatics: No lymphadenopathy.  Respiratory system: Clear to auscultation. No increased work of breathing. Status post I & D of right lower anterior chest wall abscess with packing by ED. Mild redness around incision site. Unfortunately no cultures were sent post drainage.  Cardiovascular system: S1 and S2 heard, RRR. No JVD, murmurs, gallops, clicks or pedal edema.  Gastrointestinal system: Abdomen is nondistended, soft and nontender. Normal bowel sounds heard. No organomegaly or masses appreciated.  Central nervous system: Alert and oriented. No focal neurological deficits.  Extremities: Symmetric 5 x 5 power. Peripheral pulses symmetrically felt.   Skin: No acute findings. Extensive scars from psoriatic skin rash over anterior abdominal wall, bilateral forearm/elbows.  Musculoskeletal system: Negative exam.  Psychiatry: Pleasant and cooperative.   Labs on Admission:  Basic Metabolic Panel:  Recent Labs Lab 04/03/15 1435  NA 138    K 5.3*  CL 106  CO2 <5*  GLUCOSE 522*  BUN 21*  CREATININE 1.96*  CALCIUM 7.9*   Liver Function Tests: No results for input(s): AST, ALT, ALKPHOS, BILITOT, PROT, ALBUMIN in the last 168 hours. No results for input(s): LIPASE, AMYLASE in the last 168 hours. No results for input(s): AMMONIA in the last 168 hours. CBC:  Recent Labs Lab 04/03/15 1156  WBC 22.7*  NEUTROABS 19.6*  HGB 13.7  HCT 42.1  MCV 97.9  PLT 353   Cardiac Enzymes: No results for input(s): CKTOTAL, CKMB, CKMBINDEX, TROPONINI in the last 168 hours.  BNP (last 3 results) No results for input(s): PROBNP in the last 8760 hours. CBG:  Recent Labs Lab 04/03/15 1200 04/03/15 1306 04/03/15 1400 04/03/15 1459 04/03/15 1619  GLUCAP >600* 553* 537* 486* 393*    Radiological Exams on Admission: Dg Chest Portable 1 View  04/03/2015   CLINICAL DATA:  Chest pain, abdominal pain, nausea, vomiting.  EXAM: PORTABLE CHEST - 1 VIEW  COMPARISON:  02/17/2015  FINDINGS: The heart size and mediastinal contours are within normal limits. Both lungs are clear. The visualized skeletal structures are unremarkable.  IMPRESSION: No active disease.   Electronically Signed   By: Rolm Baptise M.D.   On: 04/03/2015 11:58    EKG: Independently reviewed. Sinus tachycardia at 108 bpm, RBBB (old), LAFB. QTC 519 ms.  Assessment/Plan Principal Problem:   DKA, type 1, not at goal Active Problems:   Protein-calorie malnutrition, severe   Psoriasis   Dehydration   AKI (acute kidney injury)   Chest wall abscess   1. DKA, type I, not at goal: Likely precipitated by abscess/sepsis. Admit to stepdown unit. Initiated on DKA protocol in ED-continue same. Was aggressively hydrated. Monitor BMP closely. Once his DKA has resolved, transition to Lantus and sliding scale insulin. 2. Dehydration: Secondary to DKA and GI losses. Aggressive IV fluid hydration. 3. Acute kidney injury: Secondary to dehydration and DKA. IV fluids and follow BMP in  a.m. 4. Hyperkalemia: Secondary to acute kidney injury and DKA. Treat as above and follow BMP closely. 5. Anterior chest wall abscess: Status post incision and drainage by ED-discussed with Mr. Tran-apparently 6-7 mL of pus was drained but unfortunately was not sent for culture. Received a dose of IV clindamycin in ED. Given sepsis-like presentation, will cover with broad-spectrum IV antibiotics including vancomycin, aztreonam and Flagyl. We will get wound culture. 6. Sepsis: Secondary to chest wall abscess. Sepsis protocol initiated. Patient has already received IV fluid bolus for DKA. Antibiotic regimen as above. Wound care. Chest x-ray negative. Urine microscopy not impressive. 7. Leukocytosis: Secondary to sepsis, abscess and DKA. Management as above. CBC follow-up. 8. Chest pain: Most likely secondary to abscess. Improved post incision and drainage. 9. Abdominal pain, nausea and vomiting: Most likely secondary to DKA. No acute findings on exam. Treat per DKA protocol. Monitor clinically. Improving. 10. ? Prolonged QTC: Not sure if QTC is accurate given bundle branch block. Follow EKG in AM. Monitor on telemetry.    DVT prophylaxis: Lovenox Code Status: Full  Family Communication: None at bedside  Disposition Plan: DC home when medically stable, possibly next 2-3 days.   Time spent: 65 minutes.  Vernell Leep, MD, FACP, FHM. [facility] Pager [phone]  If 7PM-7AM, please contact night-coverage [email or web address] Password [facility] 04/03/2015, 5:35 PM

## 2015-04-03 NOTE — ED Notes (Signed)
CBG - 537 

## 2015-04-03 NOTE — ED Notes (Signed)
Admitting MD at bedside.

## 2015-04-03 NOTE — ED Notes (Signed)
Pt acknowledges he brought/ is bringing no belongings except socks.

## 2015-04-03 NOTE — ED Notes (Signed)
CBG reads HIGH 

## 2015-04-03 NOTE — ED Notes (Signed)
Attempted report 

## 2015-04-03 NOTE — ED Provider Notes (Signed)
Patient has a history of diabetes and recurrent diabetic ketoacidosis. Patient has not taken his insulin at least the last day or so. Patient states his blood sugars running in the 300 yesterday. Today it's been up in the 500s. He's had frequent urination, lethargy, thirst and worsening malaise. Physical Exam  BP 148/93 mmHg  Pulse 103  Temp(Src)   Resp 26  SpO2 100%  Physical Exam  Constitutional: He appears well-developed and well-nourished. No distress.  HENT:  Head: Normocephalic and atraumatic.  Right Ear: External ear normal.  Left Ear: External ear normal.  Mouth/Throat: No oropharyngeal exudate.  Mucous membranes are dry  Eyes: Conjunctivae are normal. Right eye exhibits no discharge. Left eye exhibits no discharge. No scleral icterus.  Neck: Neck supple. No tracheal deviation present.  Cardiovascular: Tachycardia present.   Pulmonary/Chest: Effort normal. No stridor. Tachypnea noted. No respiratory distress.  Musculoskeletal: He exhibits no edema.  Neurological: He is alert. Cranial nerve deficit: no gross deficits.  Skin: Skin is warm and dry. No rash noted.  Psychiatric: He has a normal mood and affect.  Nursing note and vitals reviewed.   ED Course  Procedures  EKG Interpretation  Date/Time:  Tuesday April 03 2015 10:57:30 EDT Ventricular Rate:  108 PR Interval:  151 QRS Duration: 149 QT Interval:  387 QTC Calculation: 519 R Axis:   -110 Text Interpretation:  Sinus tachycardia Biatrial enlargement RBBB and LAFB Since last tracing rate faster Confirmed by Tremar Wickens  MD-J, Clarann Helvey ([id number]) on 04/03/2015 11:01:43 AM     CRITICAL CARE Performed by: BSWHQ,PRF Total critical care time: 35 Critical care time was exclusive of separately billable procedures and treating other patients. Critical care was necessary to treat or prevent imminent or life-threatening deterioration. Critical care was time spent personally by me on the following activities: development of treatment  plan with patient and/or surrogate as well as nursing, discussions with consultants, evaluation of patient's response to treatment, examination of patient, obtaining history from patient or surrogate, ordering and performing treatments and interventions, ordering and review of laboratory studies, ordering and review of radiographic studies, pulse oximetry and re-evaluation of patient's condition.  Medications  insulin regular (NOVOLIN R,HUMULIN R) 250 Units in sodium chloride 0.9 % 250 mL (1 Units/mL) infusion (5.4 Units/hr Intravenous New Bag/Given 04/03/15 1206)  0.9 %  sodium chloride infusion (1,000 mLs Intravenous New Bag/Given 04/03/15 1100)    Followed by  0.9 %  sodium chloride infusion (0 mLs Intravenous Stopped 04/03/15 1214)    Followed by  0.9 %  sodium chloride infusion (1,000 mLs Intravenous New Bag/Given 04/03/15 1115)  dextrose 5 %-0.45 % sodium chloride infusion (not administered)    MDM Patient's initial laboratory tests are consistent with diabetic ketoacidosis. He has hyperglycemia, and anion gap metabolic acidosis and hyperkalemia. No acute EKG changes at this point. Plan on IV hydration, insulin infusion and close monitoring of his electrolytes.        Dorie Rank, MD 04/03/15 [phone]

## 2015-04-03 NOTE — ED Notes (Signed)
PA at bedside for I&D of abscess on chest

## 2015-04-03 NOTE — ED Notes (Signed)
Pt complaining of ABD pain n/v. CBG in 300's yesterday. Did not take insulin today. EMS reports CBG 552. Pt complaining of frequent urination, lethargy, thirst.

## 2015-04-04 LAB — GLUCOSE, CAPILLARY
GLUCOSE-CAPILLARY: 87 mg/dL (ref 65–99)
GLUCOSE-CAPILLARY: 98 mg/dL (ref 65–99)
GLUCOSE-CAPILLARY: 58 mg/dL — AB (ref 65–99)
GLUCOSE-CAPILLARY: 114 mg/dL — AB (ref 65–99)
GLUCOSE-CAPILLARY: 67 mg/dL (ref 65–99)
Glucose-Capillary: 100 mg/dL — ABNORMAL HIGH (ref 65–99)
Glucose-Capillary: 85 mg/dL (ref 65–99)
Glucose-Capillary: 86 mg/dL (ref 65–99)
Glucose-Capillary: 88 mg/dL (ref 65–99)
Glucose-Capillary: 115 mg/dL — ABNORMAL HIGH (ref 65–99)
Glucose-Capillary: 71 mg/dL (ref 65–99)
Glucose-Capillary: 221 mg/dL — ABNORMAL HIGH (ref 65–99)

## 2015-04-04 LAB — URINE CULTURE: Culture: NO GROWTH

## 2015-04-04 LAB — BASIC METABOLIC PANEL
Anion gap: 7 (ref 5–15)
BUN: 12 mg/dL (ref 6–20)
CHLORIDE: 112 mmol/L — AB (ref 101–111)
CO2: 20 mmol/L — ABNORMAL LOW (ref 22–32)
CREATININE: 0.86 mg/dL (ref 0.61–1.24)
Calcium: 8.1 mg/dL — ABNORMAL LOW (ref 8.9–10.3)
GFR calc Af Amer: >60 mL/min (ref >60)
GLUCOSE: 91 mg/dL (ref 65–99)
POTASSIUM: 4.2 mmol/L (ref 3.5–5.1)
SODIUM: 139 mmol/L (ref 135–145)

## 2015-04-04 MED ORDER — MORPHINE SULFATE (PF) 2 MG/ML IV SOLN: 1.0000 mg | INTRAVENOUS | Status: DC | PRN

## 2015-04-04 MED ORDER — INSULIN ASPART 100 UNIT/ML ~~LOC~~ SOLN
0.0000 [IU] | Freq: Three times a day (TID) | SUBCUTANEOUS | Status: DC
  Administered 2015-04-05 (×3): 3 [IU] via SUBCUTANEOUS

## 2015-04-04 MED ORDER — INSULIN GLARGINE 100 UNIT/ML ~~LOC~~ SOLN
20.0000 [IU] | Freq: Every day | SUBCUTANEOUS | Status: DC
  Administered 2015-04-04: 20 [IU] via SUBCUTANEOUS
  Filled 2015-04-04 (×2): qty 0.2

## 2015-04-04 MED ORDER — INSULIN GLARGINE 100 UNIT/ML ~~LOC~~ SOLN
16.0000 [IU] | Freq: Every day | SUBCUTANEOUS | Status: DC
  Administered 2015-04-05: 16 [IU] via SUBCUTANEOUS
  Filled 2015-04-04: qty 0.16

## 2015-04-04 MED ORDER — PANTOPRAZOLE SODIUM 40 MG PO TBEC
40.0000 mg | DELAYED_RELEASE_TABLET | Freq: Every day | ORAL | Status: DC
  Administered 2015-04-05: 40 mg via ORAL
  Filled 2015-04-04: qty 1

## 2015-04-04 MED ORDER — DOXYCYCLINE HYCLATE 100 MG PO TABS
100.0000 mg | ORAL_TABLET | Freq: Two times a day (BID) | ORAL | Status: DC
  Administered 2015-04-04 – 2015-04-05 (×2): 100 mg via ORAL
  Filled 2015-04-04 (×4): qty 1

## 2015-04-04 MED ORDER — POTASSIUM CHLORIDE 10 MEQ/100ML IV SOLN
10.0000 meq | INTRAVENOUS | Status: AC
  Administered 2015-04-04 (×2): 10 meq via INTRAVENOUS
  Filled 2015-04-04 (×2): qty 100

## 2015-04-04 MED ORDER — INSULIN GLARGINE 100 UNIT/ML ~~LOC~~ SOLN: 20.0000 [IU] | Freq: Every day | SUBCUTANEOUS | Status: DC

## 2015-04-04 MED ORDER — TRAMADOL HCL 50 MG PO TABS: 50.0000 mg | ORAL_TABLET | Freq: Three times a day (TID) | ORAL | Status: DC | PRN

## 2015-04-04 NOTE — Progress Notes (Signed)
Hypoglycemic Event  CBG: 58   Treatment: 15 GM carbohydrate snack  Symptoms: None  Follow-up CBG: HUDJ:[id number] CBG Result:114  Possible Reasons for Event: Other: Insulin drip just d/c  Comments/MD notified:    Melven Sartorius  Remember to initiate Hypoglycemia Order Set & complete

## 2015-04-04 NOTE — Hospital Discharge Follow-Up (Signed)
[facility] Care Coordination Note:  Admit date:  04/03/15 Discharge date: TBD Discharge Disposition: Home with family Patient contact: [phone] Emergency contact(s): [phone] ([place]-daughter)  This Case Manager reviewed patient's EMR and determined patient would benefit from post-discharge medical management and chronic care management services through the [facility]. Patient has a history of type 1 diabetes mellitus, severe protein-calorie malnutrition, acute kidney injury, chest wall abcess. Patient receiving treatment for DKA, dehydration, acute kidney injury, hyperkalemia, anterior chest wall abscess-s/p I&D in ED, sepsis secondary to chest wall abscess. Patient has had 3 admissions and 2 ED visits in the last 6 months. This Case Manager met with patient to discuss the services and medical management that can be provided at the [facility]. Patient verbalized understanding and agreed to receive post-discharge care at the [facility].   Patient scheduled for Transitional Care appointment on 04/13/15 at 1530 with Dr. Amao.  Clinic information and appointment time provided to patient. Appointment information also placed on AVS.  Assessment:       Home Environment: Patient lives with his spouse and children in a private residence.       Support System: spouse       Level of functioning: independent       Home DME: none.  Patient does indicate he has a glucometer and all needed diabetes supplies.       Home care services: none      Transportation:  Patient states he uses a private vehicle to get to medical appointments. No transportation barriers noted.        Food/Nutrition: Patient indicates he get Food Stamps monthly but is uncertain of amount. Patient indicates he has access to needed food.        Medications: Patient indicates he obtains medications from [facility] or [facility] on [address]. He indicates he is able to  afford Medicaid copays and indicates he is able to obtain his medications.        PCP: Dr. [facility]  Patient Education: Patient familiar with [facility],  But this Case Manager reiterated medical management provided at [facility]. Also, reiterated available resources at [facility]. Patient verbalized understanding.            Arranged services:        Services communicated to Angela Cole, RN CM 

## 2015-04-04 NOTE — Progress Notes (Signed)
Hypoglycemic Event  CBG: 67  Treatment: 15 GM carbohydrate snack  Symptoms: None  Follow-up CBG: Time:1323 CBG Result:115  Possible Reasons for Event: Inadequate meal intake  Comments/MD notified: Patient states after episodes of DKA he isn't very hungry and his glucose has been lower after these episodes previously.     Velvet Bathe E  Remember to initiate Hypoglycemia Order Set & complete

## 2015-04-04 NOTE — Consult Note (Addendum)
WOC wound consult note Reason for Consult: Consult requested for chest wound.  Pt had I&D in the ER for an abscess to middle chest. Wound type: Full thickness Measurement: .3X.3X.6cm with .5cm undermining to wound edges. Wound bed: Moist red woundbed Drainage (amount, consistency, odor) Mod amt pink drainage, no odor.  Periwound: Erythremia surrounding wound to 2 cm, induration and raised around wound edges, tender to touch. Dressing procedure/placement/frequency:  Pt is on systemic IV coverage. Removed previous packing and re-filled with Iodoform packing using swab to fill.  Bedside nurses to change Q day. Pt tolerated with mod amt discomfort. Please re-consult if further assistance is needed.  Thank-you,  Julien Girt MSN, [place], [place], [place], [place]

## 2015-04-04 NOTE — Progress Notes (Signed)
[facility] TEAM 1 - Stepdown/ICU TEAM Progress Note  Jared Murray [id number] DOB: Jul 11, 1976 DOA: 04/03/2015 PCP: Lorayne Marek, MD  Admit HPI / Brief Narrative: 39 y.o. male with history of DM1, recurrent DKA, psoriasis, ongoing tobacco and possible alcohol abuse, and GERD who presented to [facility] ED on 04/03/15 with complaints of 2 days worth of abdominal pain nausea and vomiting as well as a painful lump on his chest. EMS was called and patient was transported to ED where he was found to be in DKA and noted to have a right anterior chest wall abscess. While in ED, he had transient hypothermia (resolved), tachycardia, tachypnea. Admitting labs: Potassium 5.3, bicarbonate less than 5, glucose 522, BUN 21, creatinine 1.96, WBC 22.7, chest x-ray without active disease and urine microscopy without features of urinary tract infection.   HPI/Subjective: The patient is resting comfortably.  He admits to having a poor appetite in his nurse confirms that he has not eaten much today.  He has suffered with significant hypoglycemia today.  He denies chest pain nausea vomiting shortness of breath fevers chills or headache.  Assessment/Plan:  DKA in uncontrolled DM 1 DKA resolved and patient now off insulin drip - diabetes not controlled with patient now having significant hypoglycemia likely due to poor oral intake - we will lower long-acting insulin dose and encourage oral intake  Acute kidney injury Creatinine 1.96 at time of presentation - creatinine has normalized with volume resuscitation  Sepsis due to Anterior chest wall abscess Sepsis physiology has essentially resolved - it appears that wound cultures were not accomplished at the time of the patient's I/D - thus far blood cultures are unrevealing - will narrow antibiotics and follow clinically - patient will need to be taught proper wound care prior to discharge  Code Status: FULL Family Communication: no family present at time of  exam Disposition Plan: Discharge home when hypoglycemia resolved/CBG stabilized - stable for transfer to medical bed  Consultants: None  Procedures: I/D chest wall abscess 8/16 in ER  Antibiotics: Aztreonam 8/16 > Flagyl 8/16 > Clindamycin 8/16 Vancomycin 8/16 >  DVT prophylaxis: Lovenox  Objective: Blood pressure 115/73, pulse 103, temperature 98.3 F (36.8 C), temperature source Oral, resp. rate 19, height 5\' 9"  (1.753 m), weight 54.9 kg (121 lb 0.5 oz), SpO2 100 %.  Intake/Output Summary (Last 24 hours) at 04/04/15 1650 Last data filed at 04/04/15 1454  Gross per 24 hour  Intake 2442.03 ml  Output    675 ml  Net 1767.03 ml   Exam: General: No acute respiratory distress Lungs: Clear to auscultation bilaterally without wheezes or crackles Cardiovascular: Regular rate and rhythm without murmur gallop or rub normal S1 and S2 Abdomen: Nontender, nondistended, soft, bowel sounds positive, no rebound, no ascites, no appreciable mass Extremities: No significant cyanosis, clubbing, or edema bilateral lower extremities Cutaneous: Multiple plaques consistent with psoriasis; Wound at area of xiphoid process inspected and is currently clean and dry with iodoform packing in place without purulent discharge or surrounding induration  Data Reviewed: Basic Metabolic Panel:  Recent Labs Lab 04/03/15 1435 04/03/15 2010 04/03/15 2239 04/04/15 0235  NA 138 140 140 139  K 5.3* 4.2 3.9 4.2  CL 106 113* 112* 112*  CO2 <5* 14* 19* 20*  GLUCOSE 522* 202* 159* 91  BUN 21* 15 14 12   CREATININE 1.96* 1.37* 0.99 0.86  CALCIUM 7.9* 8.2* 8.1* 8.1*    CBC:  Recent Labs Lab 04/03/15 1156  WBC 22.7*  NEUTROABS 19.6*  HGB 13.7  HCT 42.1  MCV 97.9  PLT 353    Liver Function Tests: No results for input(s): AST, ALT, ALKPHOS, BILITOT, PROT, ALBUMIN in the last 168 hours. No results for input(s): LIPASE, AMYLASE in the last 168 hours. No results for input(s): AMMONIA in the last  168 hours.  Coags:  Recent Labs Lab 04/03/15 2010  INR 1.04    Recent Labs Lab 04/03/15 2010  APTT 24   CBG:  Recent Labs Lab 04/04/15 0728 04/04/15 0813 04/04/15 1157 04/04/15 1323 04/04/15 1645  GLUCAP 58* 114* 67 115* 71    Recent Results (from the past 240 hour(s))  Urine culture     Status: None   Collection Time: 04/03/15 11:35 AM  Result Value Ref Range Status   Specimen Description URINE, RANDOM  Final   Special Requests NONE  Final   Culture NO GROWTH 1 DAY  Final   Report Status 04/04/2015 FINAL  Final  MRSA PCR Screening     Status: None   Collection Time: 04/03/15  6:30 PM  Result Value Ref Range Status   MRSA by PCR NEGATIVE NEGATIVE Final    Comment:        The GeneXpert MRSA Assay (FDA approved for NASAL specimens only), is one component of a comprehensive MRSA colonization surveillance program. It is not intended to diagnose MRSA infection nor to guide or monitor treatment for MRSA infections.   Culture, blood (routine x 2)     Status: None (Preliminary result)   Collection Time: 04/03/15  8:10 PM  Result Value Ref Range Status   Specimen Description BLOOD LEFT ANTECUBITAL  Final   Special Requests IN PEDIATRIC BOTTLE 5CC  Final   Culture NO GROWTH < 24 HOURS  Final   Report Status PENDING  Incomplete  Culture, blood (routine x 2)     Status: None (Preliminary result)   Collection Time: 04/03/15  8:18 PM  Result Value Ref Range Status   Specimen Description BLOOD LEFT HAND  Final   Special Requests IN PEDIATRIC BOTTLE 4CC  Final   Culture NO GROWTH < 24 HOURS  Final   Report Status PENDING  Incomplete     Studies:   Recent x-ray studies have been reviewed in detail by the Attending Physician  Scheduled Meds:  Scheduled Meds: . aztreonam  1 g Intravenous Q8H  . clobetasol cream  1 application Topical Daily  . enoxaparin (LOVENOX) injection  40 mg Subcutaneous Q24H  . folic acid  1 mg Oral Daily  . insulin aspart  0-9 Units  Subcutaneous TID WC  . insulin glargine  20 Units Subcutaneous Daily  . metronidazole  500 mg Intravenous Q8H  . multivitamin with minerals  1 tablet Oral Daily  . pantoprazole (PROTONIX) IV  40 mg Intravenous Q24H  . thiamine  100 mg Oral Daily   Or  . thiamine  100 mg Intravenous Daily  . vancomycin  1,250 mg Intravenous Q24H    Time spent on care of this patient: 35 mins   Mykia Holton T , MD   [facility] Office  [phone] Pager - Text Page per Shea Evans as per below:  On-Call/Text Page:      [email or web address]      password [email or web address]  If 7PM-7AM, please contact night-coverage [email or web address] Password [email or web address] 04/04/2015, 4:50 PM   LOS: 1 day

## 2015-04-04 NOTE — Progress Notes (Signed)
UR COMPLETED  

## 2015-04-05 DIAGNOSIS — E1010 Type 1 diabetes mellitus with ketoacidosis without coma: Secondary | ICD-10-CM

## 2015-04-05 DIAGNOSIS — IMO0002 Reserved for concepts with insufficient information to code with codable children: Secondary | ICD-10-CM | POA: Diagnosis present

## 2015-04-05 DIAGNOSIS — L409 Psoriasis, unspecified: Secondary | ICD-10-CM

## 2015-04-05 DIAGNOSIS — E860 Dehydration: Secondary | ICD-10-CM

## 2015-04-05 DIAGNOSIS — L02213 Cutaneous abscess of chest wall: Secondary | ICD-10-CM

## 2015-04-05 DIAGNOSIS — E1065 Type 1 diabetes mellitus with hyperglycemia: Secondary | ICD-10-CM

## 2015-04-05 LAB — CBC
HCT: 34.9 % — ABNORMAL LOW (ref 39.0–52.0)
Hemoglobin: 12.2 g/dL — ABNORMAL LOW (ref 13.0–17.0)
MCH: 31 pg (ref 26.0–34.0)
MCHC: 35 g/dL (ref 30.0–36.0)
MCV: 88.6 fL (ref 78.0–100.0)
PLATELETS: 265 10*3/uL (ref 150–400)
RBC: 3.94 MIL/uL — AB (ref 4.22–5.81)
RDW: 13.2 % (ref 11.5–15.5)
WBC: 5.3 10*3/uL (ref 4.0–10.5)

## 2015-04-05 LAB — GLUCOSE, CAPILLARY
GLUCOSE-CAPILLARY: 212 mg/dL — AB (ref 65–99)
GLUCOSE-CAPILLARY: 210 mg/dL — AB (ref 65–99)
GLUCOSE-CAPILLARY: 167 mg/dL — AB (ref 65–99)
Glucose-Capillary: 72 mg/dL (ref 65–99)
Glucose-Capillary: 65 mg/dL (ref 65–99)
Glucose-Capillary: 234 mg/dL — ABNORMAL HIGH (ref 65–99)

## 2015-04-05 LAB — BASIC METABOLIC PANEL
Anion gap: 9 (ref 5–15)
CHLORIDE: 106 mmol/L (ref 101–111)
CO2: 20 mmol/L — AB (ref 22–32)
CREATININE: 0.62 mg/dL (ref 0.61–1.24)
Calcium: 8.5 mg/dL — ABNORMAL LOW (ref 8.9–10.3)
GFR calc non Af Amer: >60 mL/min (ref >60)
Glucose, Bld: 109 mg/dL — ABNORMAL HIGH (ref 65–99)
Potassium: 3.5 mmol/L (ref 3.5–5.1)
Sodium: 135 mmol/L (ref 135–145)

## 2015-04-05 MED ORDER — INSULIN GLARGINE 100 UNIT/ML ~~LOC~~ SOLN: 16.0000 [IU] | Freq: Every day | SUBCUTANEOUS | Status: DC

## 2015-04-05 MED ORDER — DOXYCYCLINE HYCLATE 100 MG PO TABS: 100.0000 mg | ORAL_TABLET | Freq: Two times a day (BID) | ORAL | Status: DC

## 2015-04-05 NOTE — Progress Notes (Signed)
Utilization Review completed. Jong Rickman RN BSN CM 

## 2015-04-05 NOTE — Consult Note (Addendum)
WOC follow-up: Consult performed yesterday for chest wound; refer to previous consult note. Demonstrated dressing change routine to patient at that time using Iodoform packing. Discussed plan of care with bedside nurse today and she plans to re-demonstrate dressing change to patient today and teach plan of care after discharge. Please re-consult if further assistance is needed.  Thank-you,  Julien Girt MSN, [place], [place], [place], [place]

## 2015-04-05 NOTE — Progress Notes (Signed)
Jared Murray [id number]  Transfer Data: 04/05/2015 11:52 AM  Attending Provider: Allie Bossier, MD  JWT:GRMBOB, Jared Prey, MD  Code Status: Full  Jared Murray Jared Murray is a 39 y.o. male patient transferred from 3s  -No acute distress noted.  -No complaints of shortness of breath.  -No complaints of chest pain.   Allergies: Penicillins  Past Medical History:  has a past medical history of Eczema; Type 1 diabetes mellitus; Psoriasis; and GERD (gastroesophageal reflux disease).  Past Surgical History:  has past surgical history that includes Finger surgery.  Social History:  reports that he has been smoking Cigarettes.  He does not have any smokeless tobacco history on file. He reports that he drinks alcohol. He reports that he does not use illicit drugs.   Patient orientated to room.  Admission inpatient armband information verified with patient/family to include name and date of birth and placed on patient arm. Call light within reach. Patient able to voice and demonstrate understanding of unit orientation instructions.  Will continue to evaluate and treat per MD orders.

## 2015-04-05 NOTE — Progress Notes (Signed)
Jared Murray to be D/C'd Home per MD order.  Discussed with the patient and all questions fully answered.  VSS, Skin clean, dry and intact without evidence of skin break down, no evidence of skin tears noted. IV catheter discontinued intact. Site without signs and symptoms of complications. Dressing and pressure applied.  An After Visit Summary was printed and given to the patient. Patient received prescription.  D/c education completed with patient/family including follow up instructions, medication list, d/c activities limitations if indicated, with other d/c instructions as indicated by MD - patient able to verbalize understanding, all questions fully answered.   Patient instructed to return to ED, call 911, or call MD for any changes in condition.   Patient  D/C home via private auto.  [name] F 04/05/2015 6:10 PM

## 2015-04-05 NOTE — Discharge Summary (Signed)
Physician Discharge Summary  Jared Murray UQJ:[id number] DOB: Apr 27, 1976 DOA: 04/03/2015  PCP: Lorayne Marek, MD  Admit date: 04/03/2015 Discharge date: 04/05/2015  Time spent: 35 minutes  Recommendations for Outpatient Follow-up:  DKA in uncontrolled DM 1 DKA resolved and patient now off insulin drip - diabetes not controlled with patient now having significant hypoglycemia likely due to poor oral intake - we will lower long-acting insulin dose and encourage oral intake -Follow-up with  community health and wellness clinic  Acute kidney injury -Creatinine 1.96 at time of presentation - creatinine has normalized with volume resuscitation  Sepsis due to Anterior chest wall abscess Sepsis physiology has essentially resolved - it appears that wound cultures were not accomplished at the time of the patient's I/D - thus far blood cultures are unrevealing - will narrow antibiotics and follow clinically  - patient has been taught proper wound care prior to discharge -Continue doxycycline for 7 days   Discharge Diagnoses:  Principal Problem:   DKA, type 1, not at goal Active Problems:   Protein-calorie malnutrition, severe   Psoriasis   Dehydration   AKI (acute kidney injury)   Chest wall abscess   Cutaneous abscess of chest wall   Diabetes type 1, uncontrolled   Discharge Condition: Stable  Diet recommendation: American diabetic Association  Filed Weights   04/03/15 1834  Weight: 54.9 kg (121 lb 0.5 oz)    History of present illness:  39 y.o. BM PMHx DM1, recurrent DKA, psoriasis, ongoing tobacco and possible alcohol abuse, and GERD   Presented to [facility] ED on 04/03/15 with complaints of 2 days worth of abdominal pain nausea and vomiting as well as a painful lump on Jared Murray chest. EMS was called and patient was transported to ED where he was found to be in DKA and noted to have a right anterior chest wall abscess. While in ED, he had transient hypothermia (resolved),  tachycardia, tachypnea. Admitting labs: Potassium 5.3, bicarbonate less than 5, glucose 522, BUN 21, creatinine 1.96, WBC 22.7, chest x-ray without active disease and urine microscopy without features of urinary tract infection. I & D of chest wall performed in the ER, patient started on appropriate antibiotic     Procedures: I/D chest wall abscess 8/16 in ER   Antibiotics Aztreonam 8/16 > stopped 8/17 Flagyl 8/16 > stopped 8/17 Clindamycin 8/16>>. Stopped 8/16 Vancomycin 8/16 > stopped 8/17 Doxycycline 8/17>>   Discharge Exam: Filed Vitals:   04/04/15 2303 04/05/15 0456 04/05/15 0807 04/05/15 1158  BP: 109/81 116/73 108/67 122/84  Pulse:   82 79  Temp: 98.4 F (36.9 C) 97.9 F (36.6 C) 98.3 F (36.8 C) 98 F (36.7 C)  TempSrc: Oral Oral Oral Oral  Resp: 11 16 12 20   Height:      Weight:      SpO2: 100%   100%    General: A/O 4, NAD, Cardiovascular: Regular rate, negative murmurs rubs or gallops, normal S1/S2 Chest wall; patient has an incision over the xiphoid process, including, negative sign of infection. Respiratory: Clear to auscultation bilaterally  Discharge Instructions     Medication List    TAKE these medications        clobetasol cream 0.05 %  Commonly known as:  TEMOVATE  Apply 1 application topically 2 (two) times daily.     doxycycline 100 MG tablet  Commonly known as:  VIBRA-TABS  Take 1 tablet (100 mg total) by mouth every 12 (twelve) hours.     insulin glargine 100 UNIT/ML  injection  Commonly known as:  LANTUS  Inject 0.16 mLs (16 Units total) into the skin daily.     Insulin Syringe-Needle U-100 29G X 1/2" 2 ML Misc  Commonly known as:  B-D INS SYRINGE 2CC/29GX1/2"  Check Blood sugar TID and QHS     NOVOLOG 100 UNIT/ML injection  Generic drug:  insulin aspart  Sliding scale CBG 70 - 120: 0 units CBG 121 - 150: 1 unit,  CBG 151 - 200: 2 units,  CBG 201 - 250: 3 units,  CBG 251 - 300: 5 units,  CBG 301 - 350: 7 units,  CBG 351 - 400: 9  units   CBG > 400: 10 units     pantoprazole 40 MG tablet  Commonly known as:  PROTONIX  Take 1 tablet (40 mg total) by mouth daily.     promethazine 12.5 MG tablet  Commonly known as:  PHENERGAN  Take 1 tablet (12.5 mg total) by mouth every 6 (six) hours as needed for nausea or vomiting.     traMADol 50 MG tablet  Commonly known as:  ULTRAM  Take 1 tablet (50 mg total) by mouth every 8 (eight) hours as needed.       Allergies  Allergen Reactions  . Penicillins Other (See Comments)    Childhood allergy   Follow-up Information    Follow up with [facility]     On 04/13/2015.   Why:  [facility] appointment on 04/13/15 at 3:30 pm with Dr. Jarold Song.   Contact information:   [address] [place] [address] [phone]       The results of significant diagnostics from this hospitalization (including imaging, microbiology, ancillary and laboratory) are listed below for reference.    Significant Diagnostic Studies: Dg Chest Portable 1 View  04/03/2015   CLINICAL DATA:  Chest pain, abdominal pain, nausea, vomiting.  EXAM: PORTABLE CHEST - 1 VIEW  COMPARISON:  02/17/2015  FINDINGS: The heart size and mediastinal contours are within normal limits. Both lungs are clear. The visualized skeletal structures are unremarkable.  IMPRESSION: No active disease.   Electronically Signed   By: Rolm Baptise M.D.   On: 04/03/2015 11:58    Microbiology: Recent Results (from the past 240 hour(s))  Urine culture     Status: None   Collection Time: 04/03/15 11:35 AM  Result Value Ref Range Status   Specimen Description URINE, RANDOM  Final   Special Requests NONE  Final   Culture NO GROWTH 1 DAY  Final   Report Status 04/04/2015 FINAL  Final  MRSA PCR Screening     Status: None   Collection Time: 04/03/15  6:30 PM  Result Value Ref Range Status   MRSA by PCR NEGATIVE NEGATIVE Final    Comment:        The GeneXpert MRSA Assay  (FDA approved for NASAL specimens only), is one component of a comprehensive MRSA colonization surveillance program. It is not intended to diagnose MRSA infection nor to guide or monitor treatment for MRSA infections.   Culture, blood (routine x 2)     Status: None (Preliminary result)   Collection Time: 04/03/15  8:10 PM  Result Value Ref Range Status   Specimen Description BLOOD LEFT ANTECUBITAL  Final   Special Requests IN PEDIATRIC BOTTLE 5CC  Final   Culture NO GROWTH 2 DAYS  Final   Report Status PENDING  Incomplete  Culture, blood (routine x 2)  Status: None (Preliminary result)   Collection Time: 04/03/15  8:18 PM  Result Value Ref Range Status   Specimen Description BLOOD LEFT HAND  Final   Special Requests IN PEDIATRIC BOTTLE 4CC  Final   Culture NO GROWTH 2 DAYS  Final   Report Status PENDING  Incomplete     Labs: Basic Metabolic Panel:  Recent Labs Lab 04/03/15 1435 04/03/15 2010 04/03/15 2239 04/04/15 0235 04/05/15 0230  NA 138 140 140 139 135  K 5.3* 4.2 3.9 4.2 3.5  CL 106 113* 112* 112* 106  CO2 <5* 14* 19* 20* 20*  GLUCOSE 522* 202* 159* 91 109*  BUN 21* 15 14 12  <5*  CREATININE 1.96* 1.37* 0.99 0.86 0.62  CALCIUM 7.9* 8.2* 8.1* 8.1* 8.5*   Liver Function Tests: No results for input(s): AST, ALT, ALKPHOS, BILITOT, PROT, ALBUMIN in the last 168 hours. No results for input(s): LIPASE, AMYLASE in the last 168 hours. No results for input(s): AMMONIA in the last 168 hours. CBC:  Recent Labs Lab 04/03/15 1156 04/05/15 0230  WBC 22.7* 5.3  NEUTROABS 19.6*  --   HGB 13.7 12.2*  HCT 42.1 34.9*  MCV 97.9 88.6  PLT 353 265   Cardiac Enzymes: No results for input(s): CKTOTAL, CKMB, CKMBINDEX, TROPONINI in the last 168 hours. BNP: BNP (last 3 results) No results for input(s): BNP in the last 8760 hours.  ProBNP (last 3 results) No results for input(s): PROBNP in the last 8760 hours.  CBG:  Recent Labs Lab 04/05/15 0453 04/05/15 0808  04/05/15 1150 04/05/15 1353 04/05/15 1441  GLUCAP 72 212* 210* 65 167*       Signed:  Dia Crawford, MD [facility] [phone] pager

## 2015-04-06 ENCOUNTER — Telehealth: Payer: Self-pay

## 2015-04-06 NOTE — Telephone Encounter (Signed)
[facility] Post-discharge Follow-Up Phone Call:  Date of Discharge: 04/05/15 Principal Discharge Diagnosis(es): DKA in uncontrolled type 1 diabetes mellitus, acute kidney injury, sepsis due to anterior chest wall abscess Call Completed: Yes  Please check all that apply:  X  Patient is knowledgeable of his/her condition(s) and/or treatment. X Patient is caring for self at home.  ? Patient is receiving assist at home from family and/or caregiver. Family and/or caregiver is knowledgeable of patient's condition(s) and/or treatment. ? Patient is receiving home health services. If so, name of agency.     Medication Reconciliation:  X  Medication list reviewed with patient. X  Patient obtained all discharge medications-No.  Patient had not yet picked up doxycycline (100 mg every 12 hours); however, patient indicated he was currently at [facility] waiting to pick up medication.  Discussed importance of medication compliance and of taking medication exactly as prescribed. Also, reviewed patient's medication list noting Lantus dosage change. Patient aware that patient to inject 16 U of Lantus daily.  He indicated he had all other medications and indicated he was taking them as prescribed. Patient also indicates he has glucometer and diabetes supplies. Patient informed to keep blood glucose log and bring log as well as all medications to appointment on 04/13/15. Patient verbalized understanding.  Activities of Daily Living:  X  Independent ? Needs assist  ? Total Care     Community resources in place for patient:  X  None  ? Home Health/Home DME ? Assisted Living ? Support Group          Patient Education: Patient indicated he was feeling "fine." Reminded patient of [facility] appointment on 04/13/15 at 1530 with Dr. Jarold Song. Patient indicates he has transportation to appointment. Informed patient of importance of medication compliance. Encouraged patient to keep  blood glucose log and bring log as well as all medications to appointment on 04/13/15. Patient verbalized understanding. No additional needs identified.

## 2015-04-08 LAB — CULTURE, BLOOD (ROUTINE X 2)
Culture: NO GROWTH
Culture: NO GROWTH

## 2015-04-12 ENCOUNTER — Telehealth: Payer: Self-pay

## 2015-04-12 NOTE — Telephone Encounter (Signed)
Call placed to patient to check on status and to remind of appointment on 04/13/15 at 1530. Patient indicated he would be at appointment and that he has transportation to appointment.  Reminded patient to bring all medications. Inquired if patient had been keep blood glucose log; he indicated he had. Reminded patient to also bring blood glucose log to appointment. Patient verbalized understanding.  No new/additional needs identified.

## 2015-04-13 ENCOUNTER — Encounter: Payer: Self-pay | Admitting: Family Medicine

## 2015-04-13 ENCOUNTER — Ambulatory Visit: Payer: Medicaid Other | Attending: Family Medicine | Admitting: Family Medicine

## 2015-04-13 VITALS — BP 105/69 | HR 88 | Temp 98.2°F | Resp 18 | Ht 70.0 in | Wt 135.0 lb

## 2015-04-13 DIAGNOSIS — Z794 Long term (current) use of insulin: Secondary | ICD-10-CM | POA: Insufficient documentation

## 2015-04-13 DIAGNOSIS — L309 Dermatitis, unspecified: Secondary | ICD-10-CM

## 2015-04-13 DIAGNOSIS — E118 Type 2 diabetes mellitus with unspecified complications: Secondary | ICD-10-CM | POA: Diagnosis not present

## 2015-04-13 DIAGNOSIS — IMO0002 Reserved for concepts with insufficient information to code with codable children: Secondary | ICD-10-CM

## 2015-04-13 DIAGNOSIS — L02213 Cutaneous abscess of chest wall: Secondary | ICD-10-CM

## 2015-04-13 DIAGNOSIS — M879 Osteonecrosis, unspecified: Secondary | ICD-10-CM | POA: Insufficient documentation

## 2015-04-13 DIAGNOSIS — E1165 Type 2 diabetes mellitus with hyperglycemia: Secondary | ICD-10-CM

## 2015-04-13 DIAGNOSIS — L409 Psoriasis, unspecified: Secondary | ICD-10-CM | POA: Insufficient documentation

## 2015-04-13 DIAGNOSIS — M8700 Idiopathic aseptic necrosis of unspecified bone: Secondary | ICD-10-CM

## 2015-04-13 LAB — GLUCOSE, POCT (MANUAL RESULT ENTRY): POC Glucose: 164 mg/dl — AB (ref 70–99)

## 2015-04-13 MED ORDER — CLOBETASOL PROPIONATE 0.05 % EX CREA: 1.0000 "application " | TOPICAL_CREAM | Freq: Two times a day (BID) | CUTANEOUS | Status: DC

## 2015-04-13 MED ORDER — INSULIN GLARGINE 100 UNIT/ML ~~LOC~~ SOLN: 20.0000 [IU] | Freq: Every day | SUBCUTANEOUS | Status: DC

## 2015-04-13 NOTE — Progress Notes (Signed)
Patient here for follow up.  Patient reports feeling a lot better.  Patient has wound on chest, described as a spot.

## 2015-04-13 NOTE — Progress Notes (Signed)
[facility]   Date Of Telephone encounter : 04/06/15  Admit Date: 04/02/17 Discharge Date: 04/05/15  PCP: Dr Annitta Needs  Jared Murray, is a 39 y.o. male  CZY:[id number]  UXN:[id number]  DOB - 07/07/1976  CC:  Chief Complaint  Patient presents with  . Follow-up       HPI: Jared Murray is a 39 y.o. male with a history of Type 1 DM, GERD, avascular necrosis of the hips, Psoriasis, Eczema, multiple ED presentations for DKA here today for a follow up visit from hospitalization.  He presented to [facility] ED on 04/03/15 with complaints of 2 days worth of abdominal pain nausea and vomiting as well as a painful lump on his chest. EMS was called and patient was transported to ED where he was found to be in DKA and noted to have a right anterior chest wall abscess. While in ED, he had transient hypothermia (resolved), tachycardia, tachypnea. Admitting labs: Potassium 5.3, bicarbonate less than 5, glucose 522, BUN 21, creatinine 1.96, WBC 22.7, chest x-ray without active disease and urine microscopy without features of urinary tract infection.Sepsis protocol was initiated,  I & D of chest wall performed in the ER, and he was placed on Aztreonam and Flagyl as abscess was not sent for culture initially; antibiotics were later switched to Vancomycin and Clindamycin.Blood cultures showed no growth.  He was placed on IVF and insulin drip with subsequent resolution of DKA and normalization of creatinine and electrolytes. He was discharged on Doxycycline for 7 days.  Interval History: He reports fasting sugars have been in the 130-200 range and random sugars have been a little over 200. His chest wall abscess has almost resolved; he has completed his course of Doxycycline. Patient has No headache, No chest pain, No abdominal pain - No Nausea, No new weakness tingling or numbness, No Cough - SOB.  Allergies  Allergen Reactions  . Penicillins Other (See Comments)    Childhood allergy   Past  Medical History  Diagnosis Date  . Eczema   . Type 1 diabetes mellitus   . Psoriasis   . GERD (gastroesophageal reflux disease)    Current Outpatient Prescriptions on File Prior to Visit  Medication Sig Dispense Refill  . clobetasol cream (TEMOVATE) 2.54 % Apply 1 application topically 2 (two) times daily. (Patient taking differently: Apply 1 application topically daily. ) 30 g 2  . insulin glargine (LANTUS) 100 UNIT/ML injection Inject 0.16 mLs (16 Units total) into the skin daily. 10 mL 2  . Insulin Syringe-Needle U-100 (B-D INS SYRINGE 2CC/29GX1/2") 29G X 1/2" 2 ML MISC Check Blood sugar TID and QHS 100 each 12  . NOVOLOG 100 UNIT/ML injection Sliding scale CBG 70 - 120: 0 units CBG 121 - 150: 1 unit,  CBG 151 - 200: 2 units,  CBG 201 - 250: 3 units,  CBG 251 - 300: 5 units,  CBG 301 - 350: 7 units,  CBG 351 - 400: 9 units   CBG > 400: 10 units (Patient taking differently: Inject 1-10 Units into the skin 3 (three) times daily with meals. Sliding scale CBG 70 - 120: 0 units CBG 121 - 150: 1 unit,  CBG 151 - 200: 2 units,  CBG 201 - 250: 3 units,  CBG 251 - 300: 5 units,  CBG 301 - 350: 7 units,  CBG 351 - 400: 9 units   CBG > 400: 10 units) 2 vial 12  . pantoprazole (PROTONIX) 40 MG tablet Take 1 tablet (40 mg  total) by mouth daily. 30 tablet 2  . promethazine (PHENERGAN) 12.5 MG tablet Take 1 tablet (12.5 mg total) by mouth every 6 (six) hours as needed for nausea or vomiting. 30 tablet 0  . traMADol (ULTRAM) 50 MG tablet Take 1 tablet (50 mg total) by mouth every 8 (eight) hours as needed. 40 tablet 1  . doxycycline (VIBRA-TABS) 100 MG tablet Take 1 tablet (100 mg total) by mouth every 12 (twelve) hours. (Patient not taking: Reported on 04/13/2015) 12 tablet 0   No current facility-administered medications on file prior to visit.   Family History  Problem Relation Age of Onset  . Hypertension Father   . Diabetes      multiple  . Lupus Cousin   . Stroke Maternal Grandmother   . Stroke  Paternal Grandmother    Social History   Social History  . Marital Status: Married    Spouse Name: N/A  . Number of Children: N/A  . Years of Education: N/A   Occupational History  . Not on file.   Social History Main Topics  . Smoking status: Current Some Day Smoker    Types: Cigarettes  . Smokeless tobacco: Not on file     Comment: Smoking 2-3 cigs/week  . Alcohol Use: 0.0 oz/week    0 Standard drinks or equivalent per week     Comment: ocassional  . Drug Use: No     Comment: "it's been a while"  . Sexual Activity: Not on file   Other Topics Concern  . Not on file   Social History Narrative   Lives in [place] - works as a Education officer, museum   Married    Review of Systems: Constitutional: Negative for fever, chills, diaphoresis, activity change, appetite change and fatigue. HENT: Negative for ear pain, nosebleeds, congestion, facial swelling, rhinorrhea, neck pain, neck stiffness and ear discharge.  Eyes: Negative for pain, discharge, redness, itching and visual disturbance. Respiratory: Negative for cough, choking, chest tightness, shortness of breath, wheezing and stridor.  Cardiovascular: Negative for chest pain, palpitations and leg swelling. Gastrointestinal: Negative for abdominal distention. Genitourinary: Negative for dysuria, urgency, frequency, hematuria, flank pain, decreased urine volume, difficulty urinating and dyspareunia.  Musculoskeletal: Negative for back pain, joint swelling, arthralgia and gait problem. Neurological: Negative for dizziness, tremors, seizures, syncope, facial asymmetry, speech difficulty, weakness, light-headedness, numbness and headaches.  Hematological: Negative for adenopathy. Does not bruise/bleed easily. Skin: Positive for rash Psychiatric/Behavioral: Negative for hallucinations, behavioral problems, confusion, dysphoric mood, decreased concentration and agitation.    Objective:   Filed Vitals:   04/13/15 1521  BP: 105/69    Pulse: 88  Temp: 98.2 F (36.8 C)  Resp: 18    Physical Exam: Constitutional: Patient appears well-developed and well-nourished. No distress. HENT: Normocephalic, atraumatic, External right and left ear normal. Oropharynx is clear and moist.  Eyes: Conjunctivae and EOM are normal. PERRLA, no scleral icterus. Neck: Normal ROM, No JVD. No tracheal deviation. No thyromegaly. CVS: RRR, S1/S2 +, no murmurs, no gallops, no carotid bruit.  Pulmonary: Anterior chest wall induration which is not tender to palpation and has no discharge, effort and breath sounds normal, no stridor, rhonchi, wheezes, rales.  Abdominal: Soft. BS +, no distension, tenderness, rebound or guarding.  Musculoskeletal: Normal range of motion. No edema and no tenderness.  Lymphadenopathy: No lymphadenopathy noted, cervical, inguinal or axillary Neuro: Alert. Normal reflexes, muscle tone coordination. No cranial nerve deficit. Skin: Multiple psoriatic lesions on both elbows and knees and diffusely distributed and extremities;  contact dermatitis rash on abdomen.  Psychiatric: Normal mood and affect. Behavior, judgment, thought content normal.  Lab Results  Component Value Date   WBC 5.3 04/05/2015   HGB 12.2* 04/05/2015   HCT 34.9* 04/05/2015   MCV 88.6 04/05/2015   PLT 265 04/05/2015   Lab Results  Component Value Date   CREATININE 0.62 04/05/2015   BUN <5* 04/05/2015   NA 135 04/05/2015   K 3.5 04/05/2015   CL 106 04/05/2015   CO2 20* 04/05/2015    Lab Results  Component Value Date   HGBA1C 14.3* 01/22/2015   Lipid Panel  No results found for: CHOL, TRIG, HDL, CHOLHDL, VLDL, LDLCALC     Assessment and plan:  39 year old male with a history of GERD, Psoriasis, Eczema, uncontrolled type 1 diabetes mellitus (hba1c 12.2 in 03/2015) and multiple ED visits for DKA secondary to non compliance recently managed for an anterior chest wall abscess.  Type 1 DM: Uncontrolled with A1c of 12.2, CBG 164 Lantus  increased to 20units as fasting sugars are above goal of 80-120 Advised to schedule annual eye exam, up to date on Pneumovax.  Anterior chest wall abscess: Completed course of doxycycline yesterday. Advised to apply warm compressed of anterior chest wall intermittently.  Avascular necrosis of both hips: He is in the process of getting an appointment to see orthopedics to discuss management options. In the meantime I am placing him on tramadol as needed for pain.  Psoriasis: Continue topical steroid.  GERD: Controlled on PPI    The patient was given clear instructions to go to ER or return to medical center if symptoms don't improve, worsen or new problems develop. The patient verbalized understanding. The patient was told to call to get lab results if they haven't heard anything in the next week.     This note has been created with Surveyor, quantity. Any transcriptional errors are unintentional.    [name], MD. [facility] [phone] 04/13/2015, 3:39 PM

## 2015-04-13 NOTE — Patient Instructions (Signed)
Diabetes Mellitus and Food It is important for you to manage your blood sugar (glucose) level. Your blood glucose level can be greatly affected by what you eat. Eating healthier foods in the appropriate amounts throughout the day at about the same time each day will help you control your blood glucose level. It can also help slow or prevent worsening of your diabetes mellitus. Healthy eating may even help you improve the level of your blood pressure and reach or maintain a healthy weight.  HOW CAN FOOD AFFECT ME? Carbohydrates Carbohydrates affect your blood glucose level more than any other type of food. Your dietitian will help you determine how many carbohydrates to eat at each meal and teach you how to count carbohydrates. Counting carbohydrates is important to keep your blood glucose at a healthy level, especially if you are using insulin or taking certain medicines for diabetes mellitus. Alcohol Alcohol can cause sudden decreases in blood glucose (hypoglycemia), especially if you use insulin or take certain medicines for diabetes mellitus. Hypoglycemia can be a life-threatening condition. Symptoms of hypoglycemia (sleepiness, dizziness, and disorientation) are similar to symptoms of having too much alcohol.  If your health care provider has given you approval to drink alcohol, do so in moderation and use the following guidelines:  Women should not have more than one drink per day, and men should not have more than two drinks per day. One drink is equal to:  12 oz of beer.  5 oz of wine.  1 oz of hard liquor.  Do not drink on an empty stomach.  Keep yourself hydrated. Have water, diet soda, or unsweetened iced tea.  Regular soda, juice, and other mixers might contain a lot of carbohydrates and should be counted. WHAT FOODS ARE NOT RECOMMENDED? As you make food choices, it is important to remember that all foods are not the same. Some foods have fewer nutrients per serving than other  foods, even though they might have the same number of calories or carbohydrates. It is difficult to get your body what it needs when you eat foods with fewer nutrients. Examples of foods that you should avoid that are high in calories and carbohydrates but low in nutrients include:  Trans fats (most processed foods list trans fats on the Nutrition Facts label).  Regular soda.  Juice.  Candy.  Sweets, such as cake, pie, doughnuts, and cookies.  Fried foods. WHAT FOODS CAN I EAT? Have nutrient-rich foods, which will nourish your body and keep you healthy. The food you should eat also will depend on several factors, including:  The calories you need.  The medicines you take.  Your weight.  Your blood glucose level.  Your blood pressure level.  Your cholesterol level. You also should eat a variety of foods, including:  Protein, such as meat, poultry, fish, tofu, nuts, and seeds (lean animal proteins are best).  Fruits.  Vegetables.  Dairy products, such as milk, cheese, and yogurt (low fat is best).  Breads, grains, pasta, cereal, rice, and beans.  Fats such as olive oil, trans fat-free margarine, canola oil, avocado, and olives. DOES EVERYONE WITH DIABETES MELLITUS HAVE THE SAME MEAL PLAN? Because every person with diabetes mellitus is different, there is not one meal plan that works for everyone. It is very important that you meet with a dietitian who will help you create a meal plan that is just right for you. Document Released: 05/01/2005 Document Revised: 08/09/2013 Document Reviewed: 07/01/2013 Exit[employer] Patient Information 2015 [employer]. This   information is not intended to replace advice given to you by your health care provider. Make sure you discuss any questions you have with your health care provider.  

## 2015-04-25 ENCOUNTER — Telehealth: Payer: Self-pay

## 2015-04-25 NOTE — Telephone Encounter (Signed)
Attempted to contact the patient to check on his status. Call placed to # [phone] (M) and the message noted that the person is not available and there was no option to leave a voice mail message.

## 2015-04-27 ENCOUNTER — Telehealth: Payer: Self-pay

## 2015-04-27 NOTE — Telephone Encounter (Signed)
This Case Manager placed call to check on patient's status.  Patient indicated he was doing well, and no new problems identified.  Inquired if patient medications as prescribed. Patient indicated he was taking medications as prescribed and was now taking Lantus 20 U daily. Encouraged patient to continue medication compliance.  Also instructed patient to bring glucose log to appointment. Patient verbalized understanding. Patient reminded of appointment on 04/30/15 at 0930. Patient aware of appointment and indicated he will have transportation.

## 2015-04-30 ENCOUNTER — Encounter: Payer: Self-pay | Admitting: Family Medicine

## 2015-04-30 ENCOUNTER — Ambulatory Visit: Payer: Medicaid Other | Attending: Family Medicine | Admitting: Family Medicine

## 2015-04-30 VITALS — BP 115/81 | HR 95 | Temp 98.1°F | Ht 70.0 in | Wt 131.6 lb

## 2015-04-30 DIAGNOSIS — IMO0002 Reserved for concepts with insufficient information to code with codable children: Secondary | ICD-10-CM

## 2015-04-30 DIAGNOSIS — Z794 Long term (current) use of insulin: Secondary | ICD-10-CM | POA: Insufficient documentation

## 2015-04-30 DIAGNOSIS — L02213 Cutaneous abscess of chest wall: Secondary | ICD-10-CM | POA: Diagnosis not present

## 2015-04-30 DIAGNOSIS — E1065 Type 1 diabetes mellitus with hyperglycemia: Secondary | ICD-10-CM | POA: Diagnosis not present

## 2015-04-30 DIAGNOSIS — K219 Gastro-esophageal reflux disease without esophagitis: Secondary | ICD-10-CM | POA: Insufficient documentation

## 2015-04-30 DIAGNOSIS — M879 Osteonecrosis, unspecified: Secondary | ICD-10-CM | POA: Insufficient documentation

## 2015-04-30 DIAGNOSIS — E118 Type 2 diabetes mellitus with unspecified complications: Secondary | ICD-10-CM | POA: Diagnosis not present

## 2015-04-30 DIAGNOSIS — L409 Psoriasis, unspecified: Secondary | ICD-10-CM

## 2015-04-30 DIAGNOSIS — E1165 Type 2 diabetes mellitus with hyperglycemia: Secondary | ICD-10-CM

## 2015-04-30 DIAGNOSIS — M8700 Idiopathic aseptic necrosis of unspecified bone: Secondary | ICD-10-CM

## 2015-04-30 LAB — GLUCOSE, POCT (MANUAL RESULT ENTRY): POC Glucose: 311 mg/dl — AB (ref 70–99)

## 2015-04-30 LAB — POCT GLYCOSYLATED HEMOGLOBIN (HGB A1C): HEMOGLOBIN A1C: 12.5

## 2015-04-30 MED ORDER — INSULIN GLARGINE 100 UNIT/ML ~~LOC~~ SOLN: 30.0000 [IU] | Freq: Every day | SUBCUTANEOUS | Status: DC

## 2015-04-30 MED ORDER — LISINOPRIL 2.5 MG PO TABS: 2.5000 mg | ORAL_TABLET | Freq: Every day | ORAL | Status: DC

## 2015-04-30 NOTE — Progress Notes (Signed)
[place]   Date Of Telephone encounter : 04/06/15  Admit Date: 04/02/17 Discharge Date: 04/05/15  PCP: None (previously Dr Annitta Needs)  Subjective:    Patient ID: Jared Murray, male    DOB: June 05, 1976, 39 y.o.   MRN: [id number]  HPI Ryota Treece is a 39 year old male with a history of Type 1 DM, GERD, avascular necrosis of the hips, Psoriasis, Eczema, multiple ED presentations for DKA here today for a follow up visit at the [facility]. He was recently hospitalized for DKA and a chest wall abscess which has now resolved. He reports that fasting blood sugars are still elevated in the 280s despite compliance with 20 units of Lantus and NovoLog sliding scale. He is having to use about 9 units before breakfast and about 7 units before lunch and about the same before dinner. He denies hypoglycemic episodes. rom hospitalization. He also has significant pruritus from his psoriasis and still uses his clobetasol cream.   Patient has No headache, No chest pain, No abdominal pain - No Nausea, No new weakness tingling or numbness, No Cough - SOB.  Past Medical History  Diagnosis Date  . Eczema   . Type 1 diabetes mellitus   . Psoriasis   . GERD (gastroesophageal reflux disease)     Past Surgical History  Procedure Laterality Date  . Finger surgery      Social History   Social History  . Marital Status: Married    Spouse Name: N/A  . Number of Children: N/A  . Years of Education: N/A   Occupational History  . Not on file.   Social History Main Topics  . Smoking status: Current Some Day Smoker    Types: Cigarettes  . Smokeless tobacco: Not on file     Comment: Smoking 2-3 cigs/week  . Alcohol Use: 0.0 oz/week    0 Standard drinks or equivalent per week     Comment: ocassional  . Drug Use: No     Comment: "it's been a while"  . Sexual Activity: Not on file   Other Topics Concern  . Not on file   Social History Narrative   Lives in [place]  - works as a Education officer, museum   Married    Allergies  Allergen Reactions  . Penicillins Other (See Comments)    Childhood allergy   Current Outpatient Prescriptions on File Prior to Visit  Medication Sig Dispense Refill  . clobetasol cream (TEMOVATE) 6.22 % Apply 1 application topically 2 (two) times daily. 30 g 2  . insulin glargine (LANTUS) 100 UNIT/ML injection Inject 0.2 mLs (20 Units total) into the skin daily. 10 mL 2  . Insulin Syringe-Needle U-100 (B-D INS SYRINGE 2CC/29GX1/2") 29G X 1/2" 2 ML MISC Check Blood sugar TID and QHS 100 each 12  . NOVOLOG 100 UNIT/ML injection Sliding scale CBG 70 - 120: 0 units CBG 121 - 150: 1 unit,  CBG 151 - 200: 2 units,  CBG 201 - 250: 3 units,  CBG 251 - 300: 5 units,  CBG 301 - 350: 7 units,  CBG 351 - 400: 9 units   CBG > 400: 10 units (Patient taking differently: Inject 1-10 Units into the skin 3 (three) times daily with meals. Sliding scale CBG 70 - 120: 0 units CBG 121 - 150: 1 unit,  CBG 151 - 200: 2 units,  CBG 201 - 250: 3 units,  CBG 251 - 300: 5 units,  CBG 301 - 350: 7  units,  CBG 351 - 400: 9 units   CBG > 400: 10 units) 2 vial 12  . pantoprazole (PROTONIX) 40 MG tablet Take 1 tablet (40 mg total) by mouth daily. 30 tablet 2  . promethazine (PHENERGAN) 12.5 MG tablet Take 1 tablet (12.5 mg total) by mouth every 6 (six) hours as needed for nausea or vomiting. 30 tablet 0  . traMADol (ULTRAM) 50 MG tablet Take 1 tablet (50 mg total) by mouth every 8 (eight) hours as needed. 40 tablet 1   No current facility-administered medications on file prior to visit.     Review of Systems Constitutional: Patient appears well-developed and well-nourished. No distress. HENT: Normocephalic, atraumatic, External right and left ear normal. Oropharynx is clear and moist.  Eyes: Conjunctivae and EOM are normal. PERRLA, no scleral icterus. Neck: Normal ROM, No JVD. No tracheal deviation. No thyromegaly. CVS: RRR, S1/S2 +, no murmurs, no gallops, no carotid  bruit.  Pulmonary: Anterior chest wall induration which is not tender to palpation and has no discharge, effort and breath sounds normal, no stridor, rhonchi, wheezes, rales.  Abdominal: Soft. BS +, no distension, tenderness, rebound or guarding.  Musculoskeletal: Normal range of motion. No edema and no tenderness.  Lymphadenopathy: No lymphadenopathy noted, cervical, inguinal or axillary Neuro: Alert. Normal reflexes, muscle tone coordination. No cranial nerve deficit. Skin: psoriatic rash which is pruritic.  Psychiatric: Normal mood and affect. Behavior, judgment, thought content normal.     Objective: Filed Vitals:   04/30/15 0957  BP: 115/81  Pulse: 95  Temp: 98.1 F (36.7 C)  Height: 5\' 10"  (1.778 m)  Weight: 131 lb 9.6 oz (59.693 kg)  SpO2: 95%      Physical Exam  Constitutional: Patient appears well-developed and well-nourished. No distress. HENT: Normocephalic, atraumatic, External right and left ear normal. Oropharynx is clear and moist.  Eyes: Conjunctivae and EOM are normal. PERRLA, no scleral icterus. Neck: Normal ROM, No JVD. No tracheal deviation. No thyromegaly. CVS: RRR, S1/S2 +, no murmurs, no gallops, no carotid bruit.  Pulmonary: Anterior chest wall induration which is not tender to palpation and has no discharge, effort and breath sounds normal, no stridor, rhonchi, wheezes, rales.  Abdominal: Soft. BS +, no distension, tenderness, rebound or guarding.  Musculoskeletal: Normal range of motion. No edema and no tenderness.  Lymphadenopathy: No lymphadenopathy noted, cervical, inguinal or axillary Neuro: Alert. Normal reflexes, muscle tone coordination. No cranial nerve deficit. Skin: Multiple psoriatic lesions on both elbows and knees and diffusely distributed and extremities; contact dermatitis rash on abdomen.  Psychiatric: Normal mood and affect. Behavior, judgment, thought content normal.       Assessment & Plan:  39 year old male with a history of GERD,  Psoriasis, Eczema, uncontrolled type 1 diabetes mellitus (hba1c 12.5) and multiple ED visits for DKA secondary to non compliance recently managed for an anterior chest wall abscess.  Type 1 DM: Uncontrolled with A1c of 12.2, CBG 164 Lantus increased to 40m units as fasting sugars are above goal of 80-120 CPP called in to discuss diabetic education Foot exam done today; lipids , microalbumin ordered . Advised to schedule annual eye exam, up to date on Pneumovax. we'll review blood sugar log at next visit.  Anterior chest wall abscess: Resolved.  Avascular necrosis of both hips: He is in the process of getting an appointment to see orthopedics to discuss management options. In the meantime I am placing him on tramadol as needed for pain.  Psoriasis: Continue topical steroid.  GERD: Controlled on PPI

## 2015-04-30 NOTE — Patient Instructions (Signed)
Diabetes Mellitus and Food It is important for you to manage your blood sugar (glucose) level. Your blood glucose level can be greatly affected by what you eat. Eating healthier foods in the appropriate amounts throughout the day at about the same time each day will help you control your blood glucose level. It can also help slow or prevent worsening of your diabetes mellitus. Healthy eating may even help you improve the level of your blood pressure and reach or maintain a healthy weight.  HOW CAN FOOD AFFECT ME? Carbohydrates Carbohydrates affect your blood glucose level more than any other type of food. Your dietitian will help you determine how many carbohydrates to eat at each meal and teach you how to count carbohydrates. Counting carbohydrates is important to keep your blood glucose at a healthy level, especially if you are using insulin or taking certain medicines for diabetes mellitus. Alcohol Alcohol can cause sudden decreases in blood glucose (hypoglycemia), especially if you use insulin or take certain medicines for diabetes mellitus. Hypoglycemia can be a life-threatening condition. Symptoms of hypoglycemia (sleepiness, dizziness, and disorientation) are similar to symptoms of having too much alcohol.  If your health care provider has given you approval to drink alcohol, do so in moderation and use the following guidelines:  Women should not have more than one drink per day, and men should not have more than two drinks per day. One drink is equal to:  12 oz of beer.  5 oz of wine.  1 oz of hard liquor.  Do not drink on an empty stomach.  Keep yourself hydrated. Have water, diet soda, or unsweetened iced tea.  Regular soda, juice, and other mixers might contain a lot of carbohydrates and should be counted. WHAT FOODS ARE NOT RECOMMENDED? As you make food choices, it is important to remember that all foods are not the same. Some foods have fewer nutrients per serving than other  foods, even though they might have the same number of calories or carbohydrates. It is difficult to get your body what it needs when you eat foods with fewer nutrients. Examples of foods that you should avoid that are high in calories and carbohydrates but low in nutrients include:  Trans fats (most processed foods list trans fats on the Nutrition Facts label).  Regular soda.  Juice.  Candy.  Sweets, such as cake, pie, doughnuts, and cookies.  Fried foods. WHAT FOODS CAN I EAT? Have nutrient-rich foods, which will nourish your body and keep you healthy. The food you should eat also will depend on several factors, including:  The calories you need.  The medicines you take.  Your weight.  Your blood glucose level.  Your blood pressure level.  Your cholesterol level. You also should eat a variety of foods, including:  Protein, such as meat, poultry, fish, tofu, nuts, and seeds (lean animal proteins are best).  Fruits.  Vegetables.  Dairy products, such as milk, cheese, and yogurt (low fat is best).  Breads, grains, pasta, cereal, rice, and beans.  Fats such as olive oil, trans fat-free margarine, canola oil, avocado, and olives. DOES EVERYONE WITH DIABETES MELLITUS HAVE THE SAME MEAL PLAN? Because every person with diabetes mellitus is different, there is not one meal plan that works for everyone. It is very important that you meet with a dietitian who will help you create a meal plan that is just right for you. Document Released: 05/01/2005 Document Revised: 08/09/2013 Document Reviewed: 07/01/2013 Exit[employer] Patient Information 2015 [employer]. This   information is not intended to replace advice given to you by your health care provider. Make sure you discuss any questions you have with your health care provider.  

## 2015-04-30 NOTE — Progress Notes (Signed)
Patient report no pain today He took 5 units Novolog this am and drank a Glucerna shake around 0930.  His CBG is 311 He takes his Lantus around noon everyday His chest wall abscess is doing better He reports no depression and anxiety

## 2015-05-03 ENCOUNTER — Ambulatory Visit: Payer: Medicaid Other | Admitting: Cardiovascular Disease

## 2015-05-10 ENCOUNTER — Telehealth: Payer: Self-pay

## 2015-05-10 NOTE — Telephone Encounter (Signed)
This Case Manager placed call to patient to check on status and remind him of his appointment on 05/14/15 at 1200 with Dr. Jarold Song. Unable to reach patient at [phone] because when number called a message stated "person you called is unavailable right now."  Call placed to [phone]; unable to reach patient. Voicemail left requesting return call.

## 2015-05-11 ENCOUNTER — Telehealth: Payer: Self-pay

## 2015-05-11 NOTE — Telephone Encounter (Signed)
This Case Manager placed call to patient's listed numbers [phone] and [phone]. Unable to reach patient at either number. Left HIPPA compliant voicemails requesting return call.

## 2015-05-14 ENCOUNTER — Inpatient Hospital Stay (HOSPITAL_COMMUNITY)
Admission: EM | Admit: 2015-05-14 | Discharge: 2015-05-16 | DRG: 638 | Disposition: A | Discharge status: Home / Self Care | Payer: Medicaid Other | Attending: Family Medicine | Admitting: Family Medicine

## 2015-05-14 ENCOUNTER — Emergency Department (HOSPITAL_COMMUNITY): Payer: Medicaid Other

## 2015-05-14 ENCOUNTER — Encounter (HOSPITAL_COMMUNITY): Payer: Self-pay | Admitting: Emergency Medicine

## 2015-05-14 ENCOUNTER — Ambulatory Visit: Payer: Medicaid Other | Admitting: Family Medicine

## 2015-05-14 DIAGNOSIS — Z823 Family history of stroke: Secondary | ICD-10-CM

## 2015-05-14 DIAGNOSIS — E1310 Other specified diabetes mellitus with ketoacidosis without coma: Secondary | ICD-10-CM | POA: Insufficient documentation

## 2015-05-14 DIAGNOSIS — N179 Acute kidney failure, unspecified: Secondary | ICD-10-CM

## 2015-05-14 DIAGNOSIS — E875 Hyperkalemia: Secondary | ICD-10-CM

## 2015-05-14 DIAGNOSIS — L409 Psoriasis, unspecified: Secondary | ICD-10-CM

## 2015-05-14 DIAGNOSIS — E860 Dehydration: Secondary | ICD-10-CM | POA: Diagnosis present

## 2015-05-14 DIAGNOSIS — M879 Osteonecrosis, unspecified: Secondary | ICD-10-CM | POA: Diagnosis present

## 2015-05-14 DIAGNOSIS — L309 Dermatitis, unspecified: Secondary | ICD-10-CM | POA: Diagnosis present

## 2015-05-14 DIAGNOSIS — D72829 Elevated white blood cell count, unspecified: Secondary | ICD-10-CM | POA: Diagnosis present

## 2015-05-14 DIAGNOSIS — E1010 Type 1 diabetes mellitus with ketoacidosis without coma: Principal | ICD-10-CM | POA: Diagnosis present

## 2015-05-14 DIAGNOSIS — M87059 Idiopathic aseptic necrosis of unspecified femur: Secondary | ICD-10-CM | POA: Insufficient documentation

## 2015-05-14 DIAGNOSIS — E872 Acidosis, unspecified: Secondary | ICD-10-CM | POA: Insufficient documentation

## 2015-05-14 DIAGNOSIS — Z8249 Family history of ischemic heart disease and other diseases of the circulatory system: Secondary | ICD-10-CM | POA: Diagnosis not present

## 2015-05-14 DIAGNOSIS — I4510 Unspecified right bundle-branch block: Secondary | ICD-10-CM | POA: Diagnosis present

## 2015-05-14 DIAGNOSIS — Z9119 Patient's noncompliance with other medical treatment and regimen: Secondary | ICD-10-CM | POA: Diagnosis present

## 2015-05-14 DIAGNOSIS — F17210 Nicotine dependence, cigarettes, uncomplicated: Secondary | ICD-10-CM | POA: Diagnosis present

## 2015-05-14 DIAGNOSIS — Z794 Long term (current) use of insulin: Secondary | ICD-10-CM | POA: Diagnosis not present

## 2015-05-14 DIAGNOSIS — K219 Gastro-esophageal reflux disease without esophagitis: Secondary | ICD-10-CM | POA: Diagnosis present

## 2015-05-14 DIAGNOSIS — R1084 Generalized abdominal pain: Secondary | ICD-10-CM | POA: Diagnosis present

## 2015-05-14 DIAGNOSIS — Z9114 Patient's other noncompliance with medication regimen: Secondary | ICD-10-CM | POA: Diagnosis present

## 2015-05-14 DIAGNOSIS — E1110 Type 2 diabetes mellitus with ketoacidosis without coma: Secondary | ICD-10-CM | POA: Diagnosis present

## 2015-05-14 LAB — CBC WITH DIFFERENTIAL/PLATELET
BASOS PCT: 0 %
Basophils Absolute: 0 10*3/uL (ref 0.0–0.1)
EOS PCT: 0 %
Eosinophils Absolute: 0 10*3/uL (ref 0.0–0.7)
HEMATOCRIT: 45 % (ref 39.0–52.0)
HEMOGLOBIN: 14.3 g/dL (ref 13.0–17.0)
LYMPHS ABS: 1.1 10*3/uL (ref 0.7–4.0)
Lymphocytes Relative: 4 %
MCH: 31.7 pg (ref 26.0–34.0)
MCHC: 31.8 g/dL (ref 30.0–36.0)
MCV: 99.8 fL (ref 78.0–100.0)
MONOS PCT: 7 %
Monocytes Absolute: 1.8 10*3/uL — ABNORMAL HIGH (ref 0.1–1.0)
NEUTROS ABS: 23.4 10*3/uL — AB (ref 1.7–7.7)
Neutrophils Relative %: 89 %
Platelets: 440 10*3/uL — ABNORMAL HIGH (ref 150–400)
RBC: 4.51 MIL/uL (ref 4.22–5.81)
RDW: 13.8 % (ref 11.5–15.5)
WBC: 26.3 10*3/uL — ABNORMAL HIGH (ref 4.0–10.5)

## 2015-05-14 LAB — BASIC METABOLIC PANEL
ANION GAP: 22 — AB (ref 5–15)
ANION GAP: 16 — AB (ref 5–15)
ANION GAP: 8 (ref 5–15)
BUN: 21 mg/dL — AB (ref 6–20)
BUN: 18 mg/dL (ref 6–20)
BUN: 15 mg/dL (ref 6–20)
BUN: 11 mg/dL (ref 6–20)
CALCIUM: 7.9 mg/dL — AB (ref 8.9–10.3)
CALCIUM: 8.2 mg/dL — AB (ref 8.9–10.3)
CALCIUM: 7.9 mg/dL — AB (ref 8.9–10.3)
CHLORIDE: 103 mmol/L (ref 101–111)
CO2: 11 mmol/L — AB (ref 22–32)
CO2: 14 mmol/L — AB (ref 22–32)
CO2: 21 mmol/L — AB (ref 22–32)
CREATININE: 2.52 mg/dL — AB (ref 0.61–1.24)
CREATININE: 2.05 mg/dL — AB (ref 0.61–1.24)
CREATININE: 1.23 mg/dL (ref 0.61–1.24)
Calcium: 8.1 mg/dL — ABNORMAL LOW (ref 8.9–10.3)
Chloride: 109 mmol/L (ref 101–111)
Chloride: 114 mmol/L — ABNORMAL HIGH (ref 101–111)
Chloride: 111 mmol/L (ref 101–111)
Creatinine, Ser: 1.59 mg/dL — ABNORMAL HIGH (ref 0.61–1.24)
GFR calc non Af Amer: 31 mL/min — ABNORMAL LOW (ref >60)
GFR calc non Af Amer: 39 mL/min — ABNORMAL LOW (ref >60)
GFR, EST AFRICAN AMERICAN: 36 mL/min — AB (ref >60)
GFR, EST AFRICAN AMERICAN: 46 mL/min — AB (ref >60)
GFR, EST NON AFRICAN AMERICAN: 54 mL/min — AB (ref >60)
Glucose, Bld: 729 mg/dL (ref 65–99)
Glucose, Bld: 331 mg/dL — ABNORMAL HIGH (ref 65–99)
Glucose, Bld: 262 mg/dL — ABNORMAL HIGH (ref 65–99)
Glucose, Bld: 236 mg/dL — ABNORMAL HIGH (ref 65–99)
Potassium: 5.5 mmol/L — ABNORMAL HIGH (ref 3.5–5.1)
Potassium: 3.9 mmol/L (ref 3.5–5.1)
Potassium: 4.5 mmol/L (ref 3.5–5.1)
Potassium: 3.8 mmol/L (ref 3.5–5.1)
SODIUM: 142 mmol/L (ref 135–145)
SODIUM: 144 mmol/L (ref 135–145)
SODIUM: 140 mmol/L (ref 135–145)
Sodium: 141 mmol/L (ref 135–145)

## 2015-05-14 LAB — URINE MICROSCOPIC-ADD ON

## 2015-05-14 LAB — CBC
HCT: 38 % — ABNORMAL LOW (ref 39.0–52.0)
HEMOGLOBIN: 12.6 g/dL — AB (ref 13.0–17.0)
MCH: 30.5 pg (ref 26.0–34.0)
MCHC: 33.2 g/dL (ref 30.0–36.0)
MCV: 92 fL (ref 78.0–100.0)
PLATELETS: 340 10*3/uL (ref 150–400)
RBC: 4.13 MIL/uL — AB (ref 4.22–5.81)
RDW: 13.3 % (ref 11.5–15.5)
WBC: 20.2 10*3/uL — AB (ref 4.0–10.5)

## 2015-05-14 LAB — COMPREHENSIVE METABOLIC PANEL
ALK PHOS: 158 U/L — AB (ref 38–126)
ALT: 24 U/L (ref 17–63)
AST: 40 U/L (ref 15–41)
Albumin: 3.9 g/dL (ref 3.5–5.0)
BILIRUBIN TOTAL: 2.4 mg/dL — AB (ref 0.3–1.2)
BUN: 21 mg/dL — ABNORMAL HIGH (ref 6–20)
CALCIUM: 8.7 mg/dL — AB (ref 8.9–10.3)
CO2: <5 mmol/L — ABNORMAL LOW (ref 22–32)
CREATININE: 2.75 mg/dL — AB (ref 0.61–1.24)
Chloride: 89 mmol/L — ABNORMAL LOW (ref 101–111)
GFR calc non Af Amer: 28 mL/min — ABNORMAL LOW (ref >60)
GFR, EST AFRICAN AMERICAN: 32 mL/min — AB (ref >60)
GLUCOSE: 936 mg/dL — AB (ref 65–99)
Potassium: 7.2 mmol/L (ref 3.5–5.1)
Sodium: 135 mmol/L (ref 135–145)
TOTAL PROTEIN: 7.1 g/dL (ref 6.5–8.1)

## 2015-05-14 LAB — URINALYSIS, ROUTINE W REFLEX MICROSCOPIC
BILIRUBIN URINE: NEGATIVE
Glucose, UA: >1000 mg/dL — AB
Ketones, ur: >80 mg/dL — AB
Leukocytes, UA: NEGATIVE
NITRITE: NEGATIVE
PH: 5 (ref 5.0–8.0)
Protein, ur: 30 mg/dL — AB
SPECIFIC GRAVITY, URINE: 1.022 (ref 1.005–1.030)
Urobilinogen, UA: 0.2 mg/dL (ref 0.0–1.0)

## 2015-05-14 LAB — RAPID URINE DRUG SCREEN, HOSP PERFORMED
AMPHETAMINES: NOT DETECTED
Barbiturates: NOT DETECTED
Benzodiazepines: NOT DETECTED
Cocaine: NOT DETECTED
OPIATES: NOT DETECTED
TETRAHYDROCANNABINOL: NOT DETECTED

## 2015-05-14 LAB — CBG MONITORING, ED
GLUCOSE-CAPILLARY: 452 mg/dL — AB (ref 65–99)
Glucose-Capillary: >600 mg/dL (ref 65–99)
Glucose-Capillary: >600 mg/dL (ref 65–99)

## 2015-05-14 LAB — I-STAT VENOUS BLOOD GAS, ED
Acid-base deficit: 27 mmol/L — ABNORMAL HIGH (ref 0.0–2.0)
Bicarbonate: 4.1 mEq/L — ABNORMAL LOW (ref 20.0–24.0)
O2 Saturation: 68 %
PCO2 VEN: 18.9 mmHg — AB (ref 45.0–50.0)
PH VEN: 6.946 — AB (ref 7.250–7.300)
pO2, Ven: 55 mmHg — ABNORMAL HIGH (ref 30.0–45.0)

## 2015-05-14 LAB — I-STAT CG4 LACTIC ACID, ED
LACTIC ACID, VENOUS: 6.24 mmol/L — AB (ref 0.5–2.0)
Lactic Acid, Venous: 4.79 mmol/L (ref 0.5–2.0)

## 2015-05-14 LAB — BETA-HYDROXYBUTYRIC ACID

## 2015-05-14 LAB — GLUCOSE, CAPILLARY
GLUCOSE-CAPILLARY: 310 mg/dL — AB (ref 65–99)
GLUCOSE-CAPILLARY: 222 mg/dL — AB (ref 65–99)
GLUCOSE-CAPILLARY: 249 mg/dL — AB (ref 65–99)
Glucose-Capillary: 213 mg/dL — ABNORMAL HIGH (ref 65–99)
Glucose-Capillary: 249 mg/dL — ABNORMAL HIGH (ref 65–99)
Glucose-Capillary: 192 mg/dL — ABNORMAL HIGH (ref 65–99)

## 2015-05-14 LAB — MAGNESIUM: Magnesium: 2.8 mg/dL — ABNORMAL HIGH (ref 1.7–2.4)

## 2015-05-14 LAB — PHOSPHORUS: Phosphorus: 9.6 mg/dL — ABNORMAL HIGH (ref 2.5–4.6)

## 2015-05-14 LAB — MRSA PCR SCREENING: MRSA by PCR: POSITIVE — AB

## 2015-05-14 LAB — TROPONIN I: Troponin I: <0.03 ng/mL (ref <0.031)

## 2015-05-14 MED ORDER — MUPIROCIN 2 % EX OINT
1.0000 "application " | TOPICAL_OINTMENT | Freq: Two times a day (BID) | CUTANEOUS | Status: DC
  Administered 2015-05-14 – 2015-05-16 (×3): 1 via NASAL
  Filled 2015-05-14 (×2): qty 22

## 2015-05-14 MED ORDER — INSULIN ASPART 100 UNIT/ML ~~LOC~~ SOLN
10.0000 [IU] | Freq: Once | SUBCUTANEOUS | Status: AC
  Administered 2015-05-14: 10 [IU] via INTRAVENOUS
  Filled 2015-05-14: qty 1

## 2015-05-14 MED ORDER — SODIUM CHLORIDE 0.9 % IV SOLN
1000.0000 mL | Freq: Once | INTRAVENOUS | Status: AC
  Administered 2015-05-14: 1000 mL via INTRAVENOUS

## 2015-05-14 MED ORDER — SODIUM CHLORIDE 0.9 % IV BOLUS (SEPSIS): 1000.0000 mL | Freq: Once | INTRAVENOUS | Status: DC

## 2015-05-14 MED ORDER — SODIUM CHLORIDE 0.9 % IV SOLN
INTRAVENOUS | Status: DC
  Administered 2015-05-14: 12:00:00 via INTRAVENOUS

## 2015-05-14 MED ORDER — CHLORHEXIDINE GLUCONATE CLOTH 2 % EX PADS
6.0000 | MEDICATED_PAD | Freq: Every day | CUTANEOUS | Status: DC
  Administered 2015-05-15 – 2015-05-16 (×2): 6 via TOPICAL

## 2015-05-14 MED ORDER — CLOBETASOL PROPIONATE 0.05 % EX CREA
1.0000 | TOPICAL_CREAM | Freq: Two times a day (BID) | CUTANEOUS | Status: DC
Start: 2015-05-14 — End: 2015-05-14

## 2015-05-14 MED ORDER — ONDANSETRON HCL 4 MG/2ML IJ SOLN
INTRAMUSCULAR | Status: AC
  Administered 2015-05-14: 4 mg
  Filled 2015-05-14: qty 2

## 2015-05-14 MED ORDER — POTASSIUM CHLORIDE 10 MEQ/100ML IV SOLN
10.0000 meq | Freq: Once | INTRAVENOUS | Status: AC
  Administered 2015-05-14: 10 meq via INTRAVENOUS
  Filled 2015-05-14: qty 100

## 2015-05-14 MED ORDER — DEXTROSE-NACL 5-0.45 % IV SOLN: INTRAVENOUS | Status: DC

## 2015-05-14 MED ORDER — SODIUM CHLORIDE 0.9 % IV SOLN
1000.0000 mL | INTRAVENOUS | Status: DC
  Administered 2015-05-14: 1000 mL via INTRAVENOUS

## 2015-05-14 MED ORDER — DEXTROSE-NACL 5-0.45 % IV SOLN
INTRAVENOUS | Status: DC
  Administered 2015-05-14: 23:00:00 via INTRAVENOUS
  Administered 2015-05-14: 250 mL/h via INTRAVENOUS

## 2015-05-14 MED ORDER — ENOXAPARIN SODIUM 30 MG/0.3ML ~~LOC~~ SOLN
30.0000 mg | SUBCUTANEOUS | Status: DC
  Administered 2015-05-14 – 2015-05-15 (×2): 30 mg via SUBCUTANEOUS
  Filled 2015-05-14 (×2): qty 0.3

## 2015-05-14 MED ORDER — SODIUM CHLORIDE 0.9 % IV SOLN
INTRAVENOUS | Status: DC
  Administered 2015-05-14: 5.4 [IU]/h via INTRAVENOUS
  Filled 2015-05-14: qty 2.5

## 2015-05-14 MED ORDER — CALCIUM GLUCONATE 10 % IV SOLN
1.0000 g | Freq: Once | INTRAVENOUS | Status: AC
  Administered 2015-05-14: 1 g via INTRAVENOUS
  Filled 2015-05-14: qty 10

## 2015-05-14 MED ORDER — POTASSIUM CHLORIDE 10 MEQ/100ML IV SOLN
10.0000 meq | INTRAVENOUS | Status: AC
  Administered 2015-05-14 (×2): 10 meq via INTRAVENOUS
  Filled 2015-05-14 (×2): qty 100

## 2015-05-14 MED ORDER — INSULIN REGULAR HUMAN 100 UNIT/ML IJ SOLN
INTRAMUSCULAR | Status: DC
  Filled 2015-05-14: qty 2.5

## 2015-05-14 MED ORDER — TRIAMCINOLONE ACETONIDE 0.5 % EX CREA
TOPICAL_CREAM | Freq: Two times a day (BID) | CUTANEOUS | Status: DC
  Administered 2015-05-14 – 2015-05-15 (×3): via TOPICAL
  Filled 2015-05-14 (×4): qty 15

## 2015-05-14 MED ORDER — PANTOPRAZOLE SODIUM 40 MG IV SOLR
40.0000 mg | Freq: Two times a day (BID) | INTRAVENOUS | Status: DC
  Administered 2015-05-14 – 2015-05-15 (×3): 40 mg via INTRAVENOUS
  Filled 2015-05-14 (×3): qty 40

## 2015-05-14 MED ORDER — SODIUM CHLORIDE 0.9 % IV SOLN: INTRAVENOUS | Status: DC
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
## 2015-05-14 NOTE — ED Notes (Signed)
Attempted report 

## 2015-05-14 NOTE — ED Notes (Signed)
  CBG   >600

## 2015-05-14 NOTE — ED Notes (Signed)
  CBG 452

## 2015-05-14 NOTE — ED Provider Notes (Signed)
CSN: [id number]     Arrival date & time 05/14/15  1059 History   First MD Initiated Contact with Patient 05/14/15 1059     Chief Complaint  Patient presents with  . Hyperglycemia     (Consider location/radiation/quality/duration/timing/severity/associated sxs/prior Treatment) HPI Comments: Patient brought to the ER for evaluation of altered mental status. Patient brought to the ER by ambulance from home. Patient's wife reported that he was not acting like himself this morning. Patient is disoriented and confused, cannot provide much more information. Level V Caveat due to confusion.  Patient does have a history of diabetes. EMS reports that they were told that he has not been taking his medications. His blood sugar was greater than 600 for them.  Patient is a 39 y.o. male presenting with hyperglycemia.  Hyperglycemia   Past Medical History  Diagnosis Date  . Eczema   . Type 1 diabetes mellitus   . Psoriasis   . GERD (gastroesophageal reflux disease)    Past Surgical History  Procedure Laterality Date  . Finger surgery     Family History  Problem Relation Age of Onset  . Hypertension Father   . Diabetes      multiple  . Lupus Cousin   . Stroke Maternal Grandmother   . Stroke Paternal Grandmother    Social History  Substance Use Topics  . Smoking status: Current Some Day Smoker    Types: Cigarettes  . Smokeless tobacco: None     Comment: Smoking 2-3 cigs/week  . Alcohol Use: 0.0 oz/week    0 Standard drinks or equivalent per week     Comment: ocassional    Review of Systems  Unable to perform ROS: Mental status change      Allergies  Penicillins  Home Medications   Prior to Admission medications   Medication Sig Start Date End Date Taking? Authorizing Provider  clobetasol cream (TEMOVATE) 0.73 % Apply 1 application topically 2 (two) times daily. 04/13/15   [name], MD  insulin glargine (LANTUS) 100 UNIT/ML injection Inject 0.3 mLs (30 Units total) into  the skin daily. 04/30/15   [name], MD  Insulin Syringe-Needle U-100 (B-D INS SYRINGE 2CC/29GX1/2") 29G X 1/2" 2 ML MISC Check Blood sugar TID and QHS 03/22/14   Olugbemiga E Doreene Burke, MD  lisinopril (PRINIVIL,ZESTRIL) 2.5 MG tablet Take 1 tablet (2.5 mg total) by mouth daily. 04/30/15   [name], MD  NOVOLOG 100 UNIT/ML injection Sliding scale CBG 70 - 120: 0 units CBG 121 - 150: 1 unit,  CBG 151 - 200: 2 units,  CBG 201 - 250: 3 units,  CBG 251 - 300: 5 units,  CBG 301 - 350: 7 units,  CBG 351 - 400: 9 units   CBG > 400: 10 units Patient taking differently: Inject 1-10 Units into the skin 3 (three) times daily with meals. Sliding scale CBG 70 - 120: 0 units CBG 121 - 150: 1 unit,  CBG 151 - 200: 2 units,  CBG 201 - 250: 3 units,  CBG 251 - 300: 5 units,  CBG 301 - 350: 7 units,  CBG 351 - 400: 9 units   CBG > 400: 10 units 10/12/14   Ripudeep K Rai, MD  pantoprazole (PROTONIX) 40 MG tablet Take 1 tablet (40 mg total) by mouth daily. 01/29/15   [name], MD  promethazine (PHENERGAN) 12.5 MG tablet Take 1 tablet (12.5 mg total) by mouth every 6 (six) hours as needed for nausea or vomiting. 10/12/14  Ripudeep Krystal Eaton, MD  traMADol (ULTRAM) 50 MG tablet Take 1 tablet (50 mg total) by mouth every 8 (eight) hours as needed. 02/12/15   [name], MD   BP 123/74 mmHg  Pulse 124  Temp(Src) 97.4 F (36.3 C) (Temporal)  Resp 27  SpO2 100% Physical Exam  Constitutional: He appears listless. No distress.  HENT:  Head: Normocephalic and atraumatic.  Right Ear: Hearing normal.  Left Ear: Hearing normal.  Nose: Nose normal.  Mouth/Throat: Oropharynx is clear and moist and mucous membranes are normal.  Eyes: Conjunctivae and EOM are normal. Pupils are equal, round, and reactive to light.  Neck: Normal range of motion. Neck supple.  Cardiovascular: Regular rhythm, S1 normal and S2 normal.  Tachycardia present.  Exam reveals no gallop and no friction rub.   No murmur heard. Pulmonary/Chest: Effort  normal and breath sounds normal. Tachypnea noted. No respiratory distress. He exhibits no tenderness.  Abdominal: Soft. Normal appearance and bowel sounds are normal. There is no hepatosplenomegaly. There is no tenderness. There is no rebound, no guarding, no tenderness at McBurney's point and negative Murphy's sign. No hernia.  Musculoskeletal: Normal range of motion.  Neurological: He has normal strength. He appears listless. He is disoriented. No cranial nerve deficit or sensory deficit. Coordination normal. GCS eye subscore is 4. GCS verbal subscore is 4. GCS motor subscore is 6.  Skin: Skin is warm, dry and intact. No rash noted. No cyanosis.  Psychiatric: He has a normal mood and affect. His speech is normal and behavior is normal. Thought content normal.  Nursing note and vitals reviewed.   ED Course  Procedures (including critical care time) Labs Review Labs Reviewed  CBC WITH DIFFERENTIAL/PLATELET - Abnormal; Notable for the following:    WBC 26.3 (*)    Platelets 440 (*)    All other components within normal limits  I-STAT CG4 LACTIC ACID, ED - Abnormal; Notable for the following:    Lactic Acid, Venous 6.24 (*)    All other components within normal limits  CBG MONITORING, ED - Abnormal; Notable for the following:    Glucose-Capillary >600 (*)    All other components within normal limits  I-STAT VENOUS BLOOD GAS, ED - Abnormal; Notable for the following:    pH, Ven 6.946 (*)    pCO2, Ven 18.9 (*)    pO2, Ven 55.0 (*)    Bicarbonate 4.1 (*)    Acid-base deficit 27.0 (*)    All other components within normal limits  COMPREHENSIVE METABOLIC PANEL  TROPONIN I  BETA-HYDROXYBUTYRIC ACID  BLOOD GAS, VENOUS  URINALYSIS, ROUTINE W REFLEX MICROSCOPIC (NOT AT [facility])    Imaging Review No results found. I have personally reviewed and evaluated these images and lab results as part of my medical decision-making.   EKG Interpretation   Date/Time:  Monday May 14 2015  11:01:46 EDT Ventricular Rate:  126 PR Interval:  116 QRS Duration: 164 QT Interval:  455 QTC Calculation: 659 R Axis:   72 Text Interpretation:  Sinus tachycardia Right bundle branch block  Confirmed by POLLINA  MD, CHRISTOPHER [phone]) on 05/14/2015 11:13:11 AM      MDM   Final diagnoses:  Diabetic ketoacidosis associated with other specified diabetes mellitus  Lactic acidosis  Hyperkalemia    Patient presents to the ER for evaluation of altered mental status. Patient is a known diabetic and has not been taking his medications as instructed. He was exhibiting tachycardia, confusion and [place] breathing on arrival. Blood sugar  was greater than 600. EKG is right bundle branch, which is chronic for him, but he does have widening of the QRS. This is likely secondary to hyperkalemia.  Based on the fact that the patient is in DKA, was hesitant to aggressively treat hyperkalemia. Discussed with Dr. Nelda Marseille, on call for critical care. He recommends IV insulin bolus of 10 units followed by insulin drip and continued hydration therapy. Recheck the med after insulin treatment. Will see patient for admission.  CRITICAL CARE Performed by: Orpah Greek   Total critical care time: 77min  Critical care time was exclusive of separately billable procedures and treating other patients.  Critical care was necessary to treat or prevent imminent or life-threatening deterioration.  Critical care was time spent personally by me on the following activities: development of treatment plan with patient and/or surrogate as well as nursing, discussions with consultants, evaluation of patient's response to treatment, examination of patient, obtaining history from patient or surrogate, ordering and performing treatments and interventions, ordering and review of laboratory studies, ordering and review of radiographic studies, pulse oximetry and re-evaluation of patient's condition.   Orpah Greek, MD 05/14/15 1209

## 2015-05-14 NOTE — Progress Notes (Signed)
Patient is MRSA positive per nasal PCR,  Orders made and patient placed on precautions per protocol

## 2015-05-14 NOTE — Progress Notes (Addendum)
Pt arrived from ED per stretcher Pt alert/oriented x 4. CHG bath done on RA. Has  Insulin gtt running & MS IV at 150 cc/hr. Oriented to room.

## 2015-05-14 NOTE — ED Notes (Signed)
From home via [facility], CBG high, AMS, tachy, BP stable, 20g left hand
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
## 2015-05-14 NOTE — Progress Notes (Signed)
Called Internal medical teaching group to clarify order for BMETS frequency and IV fluid rate - clarification received.

## 2015-05-15 ENCOUNTER — Encounter (HOSPITAL_COMMUNITY): Payer: Self-pay | Admitting: General Practice

## 2015-05-15 DIAGNOSIS — E1010 Type 1 diabetes mellitus with ketoacidosis without coma: Principal | ICD-10-CM

## 2015-05-15 DIAGNOSIS — M87059 Idiopathic aseptic necrosis of unspecified femur: Secondary | ICD-10-CM

## 2015-05-15 LAB — BASIC METABOLIC PANEL
ANION GAP: 6 (ref 5–15)
ANION GAP: 7 (ref 5–15)
Anion gap: 6 (ref 5–15)
BUN: 9 mg/dL (ref 6–20)
BUN: 7 mg/dL (ref 6–20)
BUN: 9 mg/dL (ref 6–20)
CALCIUM: 8.1 mg/dL — AB (ref 8.9–10.3)
CHLORIDE: 114 mmol/L — AB (ref 101–111)
CHLORIDE: 110 mmol/L (ref 101–111)
CHLORIDE: 111 mmol/L (ref 101–111)
CO2: 22 mmol/L (ref 22–32)
CO2: 22 mmol/L (ref 22–32)
CO2: 22 mmol/L (ref 22–32)
CREATININE: 0.88 mg/dL (ref 0.61–1.24)
Calcium: 7.9 mg/dL — ABNORMAL LOW (ref 8.9–10.3)
Calcium: 8.1 mg/dL — ABNORMAL LOW (ref 8.9–10.3)
Creatinine, Ser: 1.04 mg/dL (ref 0.61–1.24)
Creatinine, Ser: 0.75 mg/dL (ref 0.61–1.24)
GFR calc Af Amer: >60 mL/min (ref >60)
GFR calc non Af Amer: >60 mL/min (ref >60)
GFR calc non Af Amer: >60 mL/min (ref >60)
GFR calc non Af Amer: >60 mL/min (ref >60)
GLUCOSE: 99 mg/dL (ref 65–99)
GLUCOSE: 231 mg/dL — AB (ref 65–99)
Glucose, Bld: 194 mg/dL — ABNORMAL HIGH (ref 65–99)
POTASSIUM: 3.8 mmol/L (ref 3.5–5.1)
Potassium: 3.5 mmol/L (ref 3.5–5.1)
Potassium: 3.8 mmol/L (ref 3.5–5.1)
SODIUM: 139 mmol/L (ref 135–145)
Sodium: 142 mmol/L (ref 135–145)
Sodium: 139 mmol/L (ref 135–145)

## 2015-05-15 LAB — GLUCOSE, CAPILLARY
GLUCOSE-CAPILLARY: 135 mg/dL — AB (ref 65–99)
GLUCOSE-CAPILLARY: 141 mg/dL — AB (ref 65–99)
GLUCOSE-CAPILLARY: 175 mg/dL — AB (ref 65–99)
GLUCOSE-CAPILLARY: 232 mg/dL — AB (ref 65–99)
GLUCOSE-CAPILLARY: 82 mg/dL (ref 65–99)
Glucose-Capillary: 174 mg/dL — ABNORMAL HIGH (ref 65–99)
Glucose-Capillary: 179 mg/dL — ABNORMAL HIGH (ref 65–99)
Glucose-Capillary: 43 mg/dL — CL (ref 65–99)
Glucose-Capillary: 99 mg/dL (ref 65–99)

## 2015-05-15 LAB — CBC
HCT: 33.2 % — ABNORMAL LOW (ref 39.0–52.0)
HEMOGLOBIN: 11.6 g/dL — AB (ref 13.0–17.0)
MCH: 31.3 pg (ref 26.0–34.0)
MCHC: 34.9 g/dL (ref 30.0–36.0)
MCV: 89.5 fL (ref 78.0–100.0)
Platelets: 275 10*3/uL (ref 150–400)
RBC: 3.71 MIL/uL — AB (ref 4.22–5.81)
RDW: 13.2 % (ref 11.5–15.5)
WBC: 13.5 10*3/uL — ABNORMAL HIGH (ref 4.0–10.5)

## 2015-05-15 LAB — HEMOGLOBIN A1C
Hgb A1c MFr Bld: 14.3 % — ABNORMAL HIGH (ref 4.8–5.6)
MEAN PLASMA GLUCOSE: 364 mg/dL

## 2015-05-15 MED ORDER — INSULIN ASPART 100 UNIT/ML ~~LOC~~ SOLN
0.0000 [IU] | Freq: Three times a day (TID) | SUBCUTANEOUS | Status: DC
  Administered 2015-05-15: 2 [IU] via SUBCUTANEOUS

## 2015-05-15 MED ORDER — SODIUM CHLORIDE 0.9 % IJ SOLN: 3.0000 mL | INTRAMUSCULAR | Status: DC | PRN

## 2015-05-15 MED ORDER — INSULIN GLARGINE 100 UNIT/ML ~~LOC~~ SOLN
20.0000 [IU] | SUBCUTANEOUS | Status: DC
  Administered 2015-05-15: 20 [IU] via SUBCUTANEOUS
  Filled 2015-05-15: qty 0.2

## 2015-05-15 MED ORDER — SODIUM CHLORIDE 0.9 % IJ SOLN
3.0000 mL | Freq: Two times a day (BID) | INTRAMUSCULAR | Status: DC
  Administered 2015-05-15 – 2015-05-16 (×3): 3 mL via INTRAVENOUS

## 2015-05-15 MED ORDER — SODIUM CHLORIDE 0.9 % IV SOLN: 250.0000 mL | INTRAVENOUS | Status: DC | PRN

## 2015-05-15 MED ORDER — INSULIN GLARGINE 100 UNIT/ML ~~LOC~~ SOLN
30.0000 [IU] | SUBCUTANEOUS | Status: DC
  Administered 2015-05-16: 30 [IU] via SUBCUTANEOUS
  Filled 2015-05-15 (×4): qty 0.3

## 2015-05-15 MED ORDER — INSULIN ASPART 100 UNIT/ML ~~LOC~~ SOLN: 0.0000 [IU] | Freq: Every day | SUBCUTANEOUS | Status: DC

## 2015-05-15 MED ORDER — PANTOPRAZOLE SODIUM 40 MG PO TBEC
40.0000 mg | DELAYED_RELEASE_TABLET | Freq: Two times a day (BID) | ORAL | Status: DC
  Administered 2015-05-15 – 2015-05-16 (×2): 40 mg via ORAL
  Filled 2015-05-15 (×2): qty 1

## 2015-05-15 MED ORDER — INSULIN GLARGINE 100 UNIT/ML ~~LOC~~ SOLN
25.0000 [IU] | Freq: Once | SUBCUTANEOUS | Status: DC
  Filled 2015-05-15: qty 0.25

## 2015-05-15 MED ORDER — LISINOPRIL 5 MG PO TABS
2.5000 mg | ORAL_TABLET | Freq: Every day | ORAL | Status: DC
  Administered 2015-05-16: 2.5 mg via ORAL
  Filled 2015-05-15 (×2): qty 1

## 2015-05-15 MED ORDER — INSULIN ASPART 100 UNIT/ML ~~LOC~~ SOLN
0.0000 [IU] | Freq: Three times a day (TID) | SUBCUTANEOUS | Status: DC
  Administered 2015-05-16: 3 [IU] via SUBCUTANEOUS
  Administered 2015-05-16: 15 [IU] via SUBCUTANEOUS
  Administered 2015-05-16: 3 [IU] via SUBCUTANEOUS

## 2015-05-15 MED ORDER — DEXTROSE 50 % IV SOLN
INTRAVENOUS | Status: AC
  Administered 2015-05-15: 50 mL
  Filled 2015-05-15: qty 50

## 2015-05-15 MED ORDER — INSULIN GLARGINE 100 UNIT/ML ~~LOC~~ SOLN: 30.0000 [IU] | Freq: Once | SUBCUTANEOUS | Status: DC

## 2015-05-15 MED ORDER — POTASSIUM CHLORIDE CRYS ER 20 MEQ PO TBCR
20.0000 meq | EXTENDED_RELEASE_TABLET | Freq: Two times a day (BID) | ORAL | Status: AC
  Administered 2015-05-15 (×2): 20 meq via ORAL
  Filled 2015-05-15 (×2): qty 1

## 2015-05-15 MED ORDER — INFLUENZA VAC SPLIT QUAD 0.5 ML IM SUSY
0.5000 mL | PREFILLED_SYRINGE | INTRAMUSCULAR | Status: AC
  Administered 2015-05-16: 0.5 mL via INTRAMUSCULAR
  Filled 2015-05-15: qty 0.5
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
## 2015-05-15 NOTE — Progress Notes (Signed)
Utilization Review Completed.  

## 2015-05-15 NOTE — Progress Notes (Signed)
Family Medicine Teaching Service Daily Progress Note Intern Pager: [phone]  Patient name: Jared Murray Medical record number: [id number] Date of birth: Aug 31, 1975 Age: 39 y.o. Gender: male  Primary Care Provider: Lorayne Marek, MD Consultants: None  Code Status: Full   Pt Overview and Major Events to Date:  9/26: Admitted for DKA  9/27: Transitioned off insulin drip to Lantus   Assessment and Plan: Jared Murray is a 39 y.o. male presenting with DKA . PMH is significant for Type I DM, Eczema, Psoriasis, and bilateral avascular necrosis of hips.  DKA: Likely precipitated by not taking insulin. However, will monitor for other precipitating factors.  Patient admitted with AMS. Admitted with pH of 6.9 on VBG, bicarb of 4.1, anion gap of approximately 50. On UA, >80 ketones. Glucose 936 at arrival. Was started on DKA protocol in the ED. Last HgBA1C 12.5 (04/30/15). Anion gap closed x3 overnight. Last Bicarb 22. -admit to SDU with telemetry; vitals per unit  -CBGs q4h  -UDS ordered --> negative   -repeat BMET this AM -continue to monitor mental status -transitioned to 20 units of lantus and moderate SSI  -consult with diabetes education   Hyperkalemia, Resolved: Secondary to DKA. K+ level of 7.2 and was given 1g of calcium gluconate IV for cardioprotection.  -BMET  - continue to monitor on telemetry -K 3.5 this AM; Kdur 74mEq x2 ordered   AKI, Resolved: Most likely 2/2 to dehydration and DKA. Cr 2.75 at admission. Baseline 0.7-0.9.  -Cr 0.88 on 9/27   Leukocytosis: WBC 26.3 at admission. Most likely 2/2 to ketonemia. No infectious sources identified at admission. UA negative for UTI. CXR did not show any acute abnormalities.  -monitor CBC  -WBC 9/27: 13.5   Ezcema/Psoriasis: Erythematous, scaly plaques present diffusely.  -continue home clobetasol cream   Bilateral Avascular Necrosis of Hips: Most likely secondary to poorly controlled DM. Known to patient's  outpatient physician.  -plan for ortho consult outpatient   FEN/GI: Diet Card Modified, SLIV Prophylaxis: Lovenox 30 mg q24h    Disposition: Home pending control of CBGs   Subjective:  Hypoglycemic this AM. States that he has not eaten but knows that his food will taste like cardboard. Believes this taste usually resolves around lunch time.   Objective: Temp:  [97.4 F (36.3 C)-98.2 F (36.8 C)] 97.7 F (36.5 C) (09/27 0400) Pulse Rate:  [98-125] 98 (09/26 1706) Resp:  [13-35] 13 (09/27 0400) BP: (100-154)/(51-98) 105/75 mmHg (09/27 0400) SpO2:  [100 %] 100 % (09/27 0400) Physical Exam: General: thin AA male lying in bed, frail appearing  Cardiovascular: RRR. No murmurs appreciated.  Respiratory: CTAB  Abdomen: soft, NTND  Extremities: No LE edema.   Laboratory:  Recent Labs Lab 05/14/15 1116 05/14/15 1720 05/15/15 0300  WBC 26.3* 20.2* 13.5*  HGB 14.3 12.6* 11.6*  HCT 45.0 38.0* 33.2*  PLT 440* 340 275    Recent Labs Lab 05/14/15 1116  05/14/15 2256 05/15/15 0030 05/15/15 0300  NA 135  < > 140 139 142  K 7.2*  < > 3.8 3.8 3.5  CL 89*  < > 111 111 114*  CO2 <5*  < > 21* 22 22  BUN 21*  < > 11 9 9   CREATININE 2.75*  < > 1.23 1.04 0.88  CALCIUM 8.7*  < > 7.9* 7.9* 8.1*  PROT 7.1  --   --   --   --   BILITOT 2.4*  --   --   --   --  ALKPHOS 158*  --   --   --   --   ALT 24  --   --   --   --   AST 40  --   --   --   --   GLUCOSE 936*  < > 236* 194* 99  < > = values in this interval not displayed.   Imaging/Diagnostic Tests: EKG (9/26 11:13): Stable right bundle branch block, sinus tachycardia, peaked T waves  Vent. rate 126 BPM PR interval 116 ms QRS duration 164 ms QT/QTc 455/659 ms P-R-T axes 102 72 74  EKG (9/26 12:12) Stable right bundle branch block, sinus tachycardia, peaked T waves  Vent. rate 112 BPM PR interval 136 ms QRS duration 158 ms QT/QTc 387/528 ms P-R-T axes 90 90 61  EKG (9/26 15:09) Stable right bundle branch block,  sinus tachycardia, baseline wandering  Vent. rate 114 BPM PR interval 137 ms QRS duration 133 ms QT/QTc 387/533 ms P-R-T axes 85 86 39  EXAM: PORTABLE CHEST 1 VIEW  COMPARISON: 04/03/2015  FINDINGS: Heart size and pulmonary vascularity normal and the lungs are clear. No acute osseous abnormality. Old posttraumatic changes of the distal left clavicle.  IMPRESSION: No acute abnormalities.  Jared Bang, DO 05/15/2015, 8:10 AM PGY-1, [place] Intern pager: [phone], text pages welcome

## 2015-05-15 NOTE — Care Management Note (Signed)
Case Management Note  Patient Details  Name: Jared Murray MRN: [id number] Date of Birth: 04-23-1976  Subjective/Objective:  Pt lives with spouse and children, primary care is through [facility].  Pt gets his prescriptions filled through Medicaid and has a $3.00 copay - reports meds are filled @ [facility] and [facility].  Per pharmacist @ [facility], they call pt when time for refills and offer to provide the medication with the understanding that pt will pay copay when he is able to.                               Expected Discharge Plan:  Home/Self Care  Discharge planning Services  CM Consult  Status of Service:  In process, will continue to follow  Girard Cooter, RN 05/15/2015, 8:50 AM

## 2015-05-15 NOTE — Hospital Discharge Follow-Up (Signed)
The patient is known to [facility] and has been followed at the [facility]. Attempted to meet with him today and he was very sleepy.  Will schedule a follow up appointment for him at the clinic and continue to follow his hospitalization.  Voice mail message left for [facility], CM with an update.

## 2015-05-16 LAB — CBC
HEMATOCRIT: 35.5 % — AB (ref 39.0–52.0)
HEMOGLOBIN: 12.1 g/dL — AB (ref 13.0–17.0)
MCH: 30.2 pg (ref 26.0–34.0)
MCHC: 34.1 g/dL (ref 30.0–36.0)
MCV: 88.5 fL (ref 78.0–100.0)
Platelets: 287 10*3/uL (ref 150–400)
RBC: 4.01 MIL/uL — AB (ref 4.22–5.81)
RDW: 13.3 % (ref 11.5–15.5)
WBC: 5.1 10*3/uL (ref 4.0–10.5)

## 2015-05-16 LAB — BASIC METABOLIC PANEL
Anion gap: 9 (ref 5–15)
CALCIUM: 8.1 mg/dL — AB (ref 8.9–10.3)
CHLORIDE: 100 mmol/L — AB (ref 101–111)
CO2: 22 mmol/L (ref 22–32)
CREATININE: 0.71 mg/dL (ref 0.61–1.24)
GFR calc Af Amer: >60 mL/min (ref >60)
GFR calc non Af Amer: >60 mL/min (ref >60)
Glucose, Bld: 342 mg/dL — ABNORMAL HIGH (ref 65–99)
Potassium: 3.7 mmol/L (ref 3.5–5.1)
SODIUM: 131 mmol/L — AB (ref 135–145)

## 2015-05-16 LAB — GLUCOSE, CAPILLARY
GLUCOSE-CAPILLARY: 146 mg/dL — AB (ref 65–99)
Glucose-Capillary: 229 mg/dL — ABNORMAL HIGH (ref 65–99)
Glucose-Capillary: 310 mg/dL — ABNORMAL HIGH (ref 65–99)
Glucose-Capillary: 128 mg/dL — ABNORMAL HIGH (ref 65–99)

## 2015-05-16 NOTE — Progress Notes (Signed)
An After Visit Summary was printed and given to the patient.   D/c education completed with patient/family including follow up instructions, medication list, d/c activities limitations if indicated, with other d/c instructions as indicated by MD - patient able to verbalize understanding, all questions fully answered.   Patient instructed to return to ED, call 911, or call MD for any changes in condition.    L'ESPERANCE, RACHEL C 05/16/2015 4:05 PM
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
## 2015-05-16 NOTE — Care Management Note (Addendum)
Case Management Note  Patient Details  Name: Jared Murray MRN: [id number] Date of Birth: 02-May-1976  Subjective/Objective:    Date:  05/16/15 Spoke with patient at the bedside. Introduced self as Tourist information centre manager and explained role in discharge planning and how to be reached. Verified patient lives in town, with spouse. Expressed potential no need for no other DME. Verified patient anticipates to go home with family, at time of discharge and will have  part-time supervision by family  at this time to best of their knowledge. Patient  denied needing help with their medication. Patient drives  Or wife takes him to MD appointments. Verified patient has PCP  [facility].  NCM gave RN reli-on forms for glucometer and strips at [facility] to give to patient.  Plan: CM will continue to follow for discharge planning and [facility] resources.                 Action/Plan:   Expected Discharge Date:                  Expected Discharge Plan:  Home/Self Care  In-House Referral:     Discharge planning Services  CM Consult  Post Acute Care Choice:    Choice offered to:     DME Arranged:    DME Agency:     HH Arranged:    HH Agency:     Status of Service:  In process, will continue to follow  Medicare Important Message Given:    Date Medicare IM Given:    Medicare IM give by:    Date Additional Medicare IM Given:    Additional Medicare Important Message give by:     If discussed at [place] of Stay Meetings, dates discussed:    Additional Comments:  Zenon Mayo, RN 05/16/2015, 11:39 AM

## 2015-05-16 NOTE — Progress Notes (Signed)
Family Medicine Teaching Service Daily Progress Note Intern Pager: [phone]  Patient name: DERREON CONSALVO Medical record number: [id number] Date of birth: November 28, 1975 Age: 39 y.o. 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 male presenting with DKA . PMH is significant for Type I DM, Eczema, Psoriasis, and bilateral avascular necrosis of hips.  DKA: Likely precipitated by not taking insulin.  Last HgBA1C 12.5 (04/30/15). Mental status improved since admission. Seen by diabetes coordinator.  -transferred to the floor  -CBGs q4h  -Lantus increased from 20u to 30u   Hyperkalemia, Resolved: Secondary to DKA. K+ level of 7.2 and was given 1g of calcium gluconate IV for cardioprotection.  -BMET  - continue to monitor on telemetry -K 3.7 (9/28)  AKI, Resolved: Most likely 2/2 to dehydration and DKA. Cr 2.75 at admission. Baseline 0.7-0.9.  -Cr 0.71 on 9/28  Leukocytosis, Resolved WBC 26.3 at admission. Most likely 2/2 to ketonemia. No infectious sources identified at admission. UA negative for UTI. CXR did not show any acute abnormalities.  -monitor CBC  -WBC 9/27: 5.1  Ezcema/Psoriasis: Erythematous, scaly plaques present diffusely.  -continue home clobetasol cream   Bilateral Avascular Necrosis of Hips: Most likely secondary to poorly controlled DM. Known to patient's outpatient physician.  -plan for ortho consult outpatient   FEN/GI: Diet Card Modified, SLIV Prophylaxis: Lovenox 30 mg q24h    Disposition: Home today  Subjective:  Reports that his appetite has improved. He has access to his medications and reports understanding the importance of good follow up.  Objective: Temp:  [98 F (36.7 C)-98.6 F (37 C)] 98.4 F (36.9 C) (09/28 0812) Pulse Rate:   [72-100] 72 (09/28 0812) Resp:  [13-20] 20 (09/28 0812) BP: (114-123)/(78-89) 114/86 mmHg (09/28 0812) SpO2:  [97 %-100 %] 100 % (09/28 0812) Weight:  [121 lb 12.8 oz (55.248 kg)] 121 lb 12.8 oz (55.248 kg) (09/27 1800) Physical Exam: General: thin AA male lying in bed, frail appearing  Cardiovascular: RRR. No murmurs appreciated.  Respiratory: CTAB  Abdomen: soft, NTND  Extremities: No LE edema.   Laboratory:  Recent Labs Lab 05/14/15 1116 05/14/15 1720 05/15/15 0300  WBC 26.3* 20.2* 13.5*  HGB 14.3 12.6* 11.6*  HCT 45.0 38.0* 33.2*  PLT 440* 340 275    Recent Labs Lab 05/14/15 1116  05/15/15 0030 05/15/15 0300 05/15/15 0900  NA 135  < > 139 142 139  K 7.2*  < > 3.8 3.5 3.8  CL 89*  < > 111 114* 110  CO2 <5*  < > 22 22 22   BUN 21*  < > 9 9 7   CREATININE 2.75*  < > 1.04 0.88 0.75  CALCIUM 8.7*  < > 7.9* 8.1* 8.1*  PROT 7.1  --   --   --   --   BILITOT 2.4*  --   --   --   --   ALKPHOS 158*  --   --   --   --   ALT 24  --   --   --   --   AST 40  --   --   --   --   GLUCOSE 936*  < > 194* 99 231*  < > = values in this interval not displayed.  Imaging/Diagnostic Tests: EKG (9/26 11:13): Stable right bundle branch block, sinus tachycardia, peaked T waves  Vent. rate 126 BPM PR interval 116 ms QRS duration 164 ms QT/QTc 455/659 ms P-R-T axes 102 72 74  EKG (9/26 12:12) Stable right bundle branch block, sinus tachycardia, peaked T waves  Vent. rate 112 BPM PR interval 136 ms QRS duration 158 ms QT/QTc 387/528 ms P-R-T axes 90 90 61  EKG (9/26 15:09) Stable right bundle branch block, sinus tachycardia, baseline wandering  Vent. rate 114 BPM PR interval 137 ms QRS duration 133 ms QT/QTc 387/533 ms P-R-T axes 85 86 39  EXAM: PORTABLE CHEST 1 VIEW  COMPARISON: 04/03/2015  FINDINGS: Heart size and pulmonary vascularity normal and the lungs are clear. No acute osseous abnormality. Old posttraumatic changes of the distal left  clavicle.  IMPRESSION: No acute abnormalities.  Nicolette Bang, DO 05/16/2015, 8:36 AM PGY-1, [place] Intern pager: [phone], text pages welcome

## 2015-05-16 NOTE — Hospital Discharge Follow-Up (Signed)
Transitional Care Clinic:  Patient is known to the [facility] at [facility]. Met with patient at bedside to remind of [facility] appointment on 05/21/15 at 1115 with Dr. Jarold Song. Appointment also on AVS. Patient verbalized understanding. Patient aware he will receive a discharge follow-up phone call 24-48 hours post discharge. He indicated the best number to reach him is [phone].  Will continue to follow.

## 2015-05-16 NOTE — Progress Notes (Signed)
Inpatient Diabetes Program Recommendations  AACE/ADA: New Consensus Statement on Inpatient Glycemic Control (2015)  Target Ranges:  Prepandial:   less than 140 mg/dL      Peak postprandial:   less than 180 mg/dL (1-2 hours)      Critically ill patients:  140 - 180 mg/dL   Review of Glycemic Control    Results for Jared Murray, Jared Murray (MRN [id number]) as of 05/16/2015 09:00  Ref. Range 05/15/2015 12:36 05/15/2015 16:35 05/15/2015 23:30 05/16/2015 02:04 05/16/2015 07:42  Glucose-Capillary Latest Ref Range: 65-99 mg/dL 135 (H) 82 174 (H) 229 (H) 310 (H)    Inpatient Diabetes Program Recommendations: FBS > 300 mg/dL.   Insulin - Basal: Lantus increased to 30 units Insulin - Meal Coverage: May benefit from meal coverage insulin - Novolog 3 units tidwc   Will continue to follow. Thank you. Lorenda Peck, RD, LDN, CDE Inpatient Diabetes Coordinator [phone]

## 2015-05-16 NOTE — Discharge Instructions (Signed)
You were hospitalized for Diabetic Ketoacidosis due to uncontrolled blood sugars. It is very important that you take your insulin at home to prevent this from happening again. Please go to your hospital follow up appointment.   Diabetic Ketoacidosis Diabetic ketoacidosis (DKA) is a life-threatening complication of type 1 diabetes. It must be quickly recognized and treated. Treatment requires hospitalization. CAUSES  When there is no insulin in the body, glucose (sugar) cannot be used, and the body breaks down fat for energy. When fat breaks down, acids (ketones) build up in the blood. Very high levels of glucose and high levels of acids lead to severe loss of body fluids (dehydration) and other dangerous chemical changes. This stresses your vital organs and can cause coma or death. SIGNS AND SYMPTOMS   Tiredness (fatigue).  Weight loss.  Excessive thirst.  Ketones in your urine.  Light-headedness.  Fruity or sweet smelling breath.  Excessive urination.  Visual changes.  Confusion or irritability.  Nausea or vomiting.  Rapid breathing.  Stomachache or abdominal pain. DIAGNOSIS  Your health care provider will diagnose DKA based on your history, physical exam, and blood tests. The health care provider will check to see if you have another illness that caused you to go into DKA. Most of this will be done quickly in an emergency room. TREATMENT   Fluid replacement to correct dehydration.  Insulin.  Correction of electrolytes, such as potassium and sodium.  Antibiotic medicines. PREVENTION  Always take your insulin. Do not skip your insulin injections.  If you are sick, treat yourself quickly. Your body often needs more insulin to fight the illness.  Check your blood glucose regularly.  Check urine ketones if your blood glucose is greater than 240 milligrams per deciliter (mg/dL).  Do not use outdated (expired) insulin.  If your blood glucose is high, drink plenty of  fluids. This helps flush out ketones. HOME CARE INSTRUCTIONS   If you are sick, follow the advice of your health care provider.  To prevent dehydration, drink enough water and fluids to keep your urine clear or pale yellow.  If you cannot eat, alternate between drinking fluids with sugar (soda, juices, flavored gelatin) and salty fluids (broth, bouillon).  If you can eat, follow your usual diet and drink sugar-free liquids (water, diet drinks).  Always take your usual dose of insulin. If you cannot eat or if your glucose is getting too low, call your health care provider for further instructions.  Continue to monitor your blood or urine ketones every 3-4 hours around the clock. Set your alarm clock or have someone wake you up. If you are too sick, have someone test it for you.  Rest and avoid exercise. SEEK MEDICAL CARE IF:   You have a fever.  You have ketones in your urine, or your blood glucose is higher than a level your health care provider suggests. You may need extra insulin. Call your health care provider if you need advice on adjusting your insulin.  You cannot drink at least a tablespoon (15 mL) of fluid every 15-20 minutes.  You have been vomiting for more than 2 hours.  You have symptoms of DKA:  Fruity smelling breath.  Breathing faster or slower.  Becoming very sleepy. SEEK IMMEDIATE MEDICAL CARE IF:   You have signs of dehydration:  Decreased urination.  Increased thirst.  Dry skin and mouth.  Light-headedness.  Your blood glucose is very high (as advised by your health care provider) twice in a row.  You faint.  You have chest pain or trouble breathing.  You have a sudden, severe headache.  You have sudden weakness in one arm or one leg.  You have sudden trouble speaking or swallowing.  You have vomiting or diarrhea that is getting worse after 3 hours.  You have abdominal pain. MAKE SURE YOU:   Understand these instructions.  Will watch  your condition.  Will get help right away if you are not doing well or get worse. Document Released: 08/01/2000 Document Revised: 08/09/2013 Document Reviewed: 02/07/2009 [facility] Patient Information 2015 [place], [place]. This information is not intended to replace advice given to you by your health care provider. Make sure you discuss any questions you have with your health care provider.

## 2015-05-17 ENCOUNTER — Telehealth: Payer: Self-pay

## 2015-05-17 NOTE — Telephone Encounter (Signed)
Transitional Care Clinic Post-discharge Follow-Up Phone Call:  Date of Discharge: 05/16/15 Principal Discharge Diagnosis(es): DKA Post-discharge Communication: Call placed to number patient provided [phone]. Spoke with daughter who indicated patient could be reached at [phone]. Call placed to patient. Unable to reach; HIPPA compliant voicemail left requesting return call. Call Completed: No

## 2015-05-18 ENCOUNTER — Telehealth: Payer: Self-pay

## 2015-05-18 NOTE — Telephone Encounter (Signed)
Transitional Care Clinic Post-discharge Follow-Up Phone Call:  Date of Discharge: 05/16/15 Principal Discharge Diagnosis(es): DKA Post-discharge Communication: Attempt #2 to reach patient. Call placed to [phone]; unable to reach. Voicemail left requesting return call. Call Completed: No

## 2015-05-21 ENCOUNTER — Encounter: Payer: Self-pay | Admitting: Family Medicine

## 2015-05-21 ENCOUNTER — Ambulatory Visit: Payer: Medicaid Other | Attending: Family Medicine | Admitting: Family Medicine

## 2015-05-21 VITALS — BP 97/67 | HR 91 | Temp 98.1°F | Ht 70.0 in | Wt 137.7 lb

## 2015-05-21 DIAGNOSIS — M87059 Idiopathic aseptic necrosis of unspecified femur: Secondary | ICD-10-CM

## 2015-05-21 DIAGNOSIS — L409 Psoriasis, unspecified: Secondary | ICD-10-CM

## 2015-05-21 DIAGNOSIS — K219 Gastro-esophageal reflux disease without esophagitis: Secondary | ICD-10-CM | POA: Diagnosis not present

## 2015-05-21 DIAGNOSIS — E1029 Type 1 diabetes mellitus with other diabetic kidney complication: Secondary | ICD-10-CM | POA: Diagnosis not present

## 2015-05-21 NOTE — Progress Notes (Signed)
[place]   Date Of Telephone encounter : 05/17/15, 05/18/15  Admit Date: 05/14/15 Discharge Date: 05/16/15  PCP: None (previously Dr Annitta Needs)  HPI: Jared Murray is a 39 y.o. male with Type 1DM (Hba1c 14.3), multiple hospitalizations for DKA, Psoriasis, GERD, bilateral avascular necrosis of the hip here today for a follow up visit at the transitional care clinic.  He had presented with altered mental status and hyperglycemia of 936. He was admitted with pH of 6.9 on VBG, bicarb of 4.1 and anion gap of approximately 50. On UA, >80 ketones. Was started on DKA protocol in the ED. He had admitted to not taking his insulin for about 48 hours after he had a fight with his wife and walked out of the house. Was hyperkalemic to 7.2 with peaked T waves on EKG. Was given 1g of Calcium gluconate IV for cardioprotection. Potassium decreased with insulin drip and repeat EKG did not demonstrate peaked T waves.  Of note, patient did present with AKI (Cr 2.75). With IVF, AKI resolved. Additionally, patient had WBC of 26.3 at admission. No infectious source noted (afebrile, CXR clear, U/A with a few bacteria but negative for LE and nitrites, asymptomatic) and was likely secondary to to ketonemia.   Once anion gap was closed  he was transitioned to Lantus and SSI, WBC trended down to 5.1 and creatinine down to 0.71.  Interval history: He reports doing well and is now compliant with his medications but has been taking 26 units of Lantus rather than 30 because he has had some low sugars in the 60s. He admits to dietary indiscretion and is resolving to do better. He stopped taking lisinopril because his wife had said his previous chest abscess was a side effect of Lisinopril. His Psoriatic rash is responding well to Triamcinolone he states. Patient has No headache, No chest pain, No abdominal pain - No Nausea, No new weakness tingling or numbness, No Cough - SOB.  Allergies  Allergen Reactions  .  Penicillins Other (See Comments)    Childhood allergy   Past Medical History  Diagnosis Date  . Eczema   . Type 1 diabetes mellitus ([place])   . Psoriasis   . GERD (gastroesophageal reflux disease)    Current Outpatient Prescriptions on File Prior to Visit  Medication Sig Dispense Refill  . clobetasol cream (TEMOVATE) 1.44 % Apply 1 application topically 2 (two) times daily. 30 g 2  . insulin glargine (LANTUS) 100 UNIT/ML injection Inject 0.3 mLs (30 Units total) into the skin daily. 10 mL 2  . Insulin Syringe-Needle U-100 (B-D INS SYRINGE 2CC/29GX1/2") 29G X 1/2" 2 ML MISC Check Blood sugar TID and QHS 100 each 12  . NOVOLOG 100 UNIT/ML injection Sliding scale CBG 70 - 120: 0 units CBG 121 - 150: 1 unit,  CBG 151 - 200: 2 units,  CBG 201 - 250: 3 units,  CBG 251 - 300: 5 units,  CBG 301 - 350: 7 units,  CBG 351 - 400: 9 units   CBG > 400: 10 units (Patient taking differently: Inject 1-10 Units into the skin 3 (three) times daily with meals. Sliding scale CBG 70 - 120: 0 units CBG 121 - 150: 1 unit,  CBG 151 - 200: 2 units,  CBG 201 - 250: 3 units,  CBG 251 - 300: 5 units,  CBG 301 - 350: 7 units,  CBG 351 - 400: 9 units   CBG > 400: 10 units) 2 vial 12  .  pantoprazole (PROTONIX) 40 MG tablet Take 1 tablet (40 mg total) by mouth daily. 30 tablet 2  . traMADol (ULTRAM) 50 MG tablet Take 1 tablet (50 mg total) by mouth every 8 (eight) hours as needed. 40 tablet 1  . lisinopril (PRINIVIL,ZESTRIL) 2.5 MG tablet Take 1 tablet (2.5 mg total) by mouth daily. (Patient not taking: Reported on 05/21/2015) 30 tablet 2   No current facility-administered medications on file prior to visit.   Family History  Problem Relation Age of Onset  . Hypertension Father   . Diabetes      multiple  . Lupus Cousin   . Stroke Maternal Grandmother   . Stroke Paternal Grandmother    Social History   Social History  . Marital Status: Married    Spouse Name: N/A  . Number of Children: N/A  . Years of Education:  N/A   Occupational History  . Not on file.   Social History Main Topics  . Smoking status: Current Some Day Smoker -- 0.25 packs/day for 10 years    Types: Cigarettes  . Smokeless tobacco: Never Used     Comment: Smoking 2-3 cigs/week  . Alcohol Use: 0.0 oz/week    0 Standard drinks or equivalent per week     Comment: ocassional  . Drug Use: No     Comment: "it's been a while"  . Sexual Activity: Not on file   Other Topics Concern  . Not on file   Social History Narrative   Lives in [place] - works as a Education officer, museum   Married    Review of Systems: Constitutional: Negative for fever, chills, diaphoresis, activity change, appetite change and fatigue. HENT: Negative for ear pain, nosebleeds, congestion, facial swelling, rhinorrhea, neck pain, neck stiffness and ear discharge.  Eyes: Negative for pain, discharge, redness, itching and visual disturbance. Respiratory: Negative for cough, choking, chest tightness, shortness of breath, wheezing and stridor.  Cardiovascular: Negative for chest pain, palpitations and leg swelling. Gastrointestinal: Negative for abdominal distention. Genitourinary: Negative for dysuria, urgency, frequency, hematuria, flank pain, decreased urine volume, difficulty urinating and dyspareunia.  Musculoskeletal: Negative for back pain, joint swelling, arthralgias and gait problem. Neurological: Negative for dizziness, tremors, seizures, syncope, facial asymmetry, speech difficulty, weakness, light-headedness, numbness and headaches.  Hematological: Negative for adenopathy. Does not bruise/bleed easily. Skin: Psoriatic skin leisons Psychiatric/Behavioral: Negative for hallucinations, behavioral problems, confusion, dysphoric mood, decreased concentration and agitation.    Objective:   Filed Vitals:   05/21/15 1146  BP: 97/67  Pulse: 91  Temp: 98.1 F (36.7 C)    Physical Exam: Constitutional: Patient appears well-developed and well-nourished. No  distress. HENT: Normocephalic, atraumatic, External right and left ear normal. Oropharynx is clear and moist.  Eyes: Conjunctivae and EOM are normal. PERRLA, no scleral icterus. Neck: Normal ROM, No JVD. No tracheal deviation. No thyromegaly. CVS: RRR, S1/S2 +, no murmurs, no gallops, no carotid bruit.  Pulmonary: Anterior chest wall induration which is not tender to palpation and has no discharge, effort and breath sounds normal, no stridor, rhonchi, wheezes, rales.  Abdominal: Soft. BS +, no distension, tenderness, rebound or guarding.  Musculoskeletal: Normal range of motion. No edema and no tenderness.  Lymphadenopathy: No lymphadenopathy noted, cervical, inguinal or axillary Neuro: Alert. Normal reflexes, muscle tone coordination. No cranial nerve deficit. Skin: Multiple psoriatic lesions on both elbows and knees and diffusely distributed and extremities; contact dermatitis rash on abdomen.  Psychiatric: Normal mood and affect. Behavior, judgment, thought content normal.   Lab Results  Component Value Date   WBC 5.1 05/16/2015   HGB 12.1* 05/16/2015   HCT 35.5* 05/16/2015   MCV 88.5 05/16/2015   PLT 287 05/16/2015   Lab Results  Component Value Date   CREATININE 0.71 05/16/2015   BUN <5* 05/16/2015   NA 131* 05/16/2015   K 3.7 05/16/2015   CL 100* 05/16/2015   CO2 22 05/16/2015    Lab Results  Component Value Date   HGBA1C 14.3* 05/14/2015      Assessment and plan:  39 year old male with a history of GERD, Psoriasis, Eczema, uncontrolled type 1 diabetes mellitus (hba1c 12.5) and multiple ED visits for DKA secondary to non compliance.  Type 1 DM: Uncontrolled with A1c of 14.3 Patient takes 26 rather than 30 units of Lantus due to previous Hypoglycemia I have advised him to resume Lisinopril which he previously stopped as he does not have any of the side effects as he presumes Advised to schedule annual eye exam, up to date on Pneumovax. we'll review blood sugar log at  next visit and adjust insulin regimen accordingly He is refusing seeing the clinical pharmacist for a diabetic education as he states he is well informed and promises to work on his diet   Avascular necrosis of both hips: He is in the process of getting an appointment to see orthopedics to discuss management options. In the meantime he is on tramadol as needed for pain.  Psoriasis: Continue topical steroid. Skin care discussed including avoiding scented products.  GERD: Controlled on PPI  This note has been created with Surveyor, quantity. Any transcriptional errors are unintentional.            [name], MD. [facility] [phone] 05/21/2015, 12:05 PM

## 2015-05-21 NOTE — Patient Instructions (Signed)
Diabetes Mellitus and Food It is important for you to manage your blood sugar (glucose) level. Your blood glucose level can be greatly affected by what you eat. Eating healthier foods in the appropriate amounts throughout the day at about the same time each day will help you control your blood glucose level. It can also help slow or prevent worsening of your diabetes mellitus. Healthy eating may even help you improve the level of your blood pressure and reach or maintain a healthy weight.  HOW CAN FOOD AFFECT ME? Carbohydrates Carbohydrates affect your blood glucose level more than any other type of food. Your dietitian will help you determine how many carbohydrates to eat at each meal and teach you how to count carbohydrates. Counting carbohydrates is important to keep your blood glucose at a healthy level, especially if you are using insulin or taking certain medicines for diabetes mellitus. Alcohol Alcohol can cause sudden decreases in blood glucose (hypoglycemia), especially if you use insulin or take certain medicines for diabetes mellitus. Hypoglycemia can be a life-threatening condition. Symptoms of hypoglycemia (sleepiness, dizziness, and disorientation) are similar to symptoms of having too much alcohol.  If your health care provider has given you approval to drink alcohol, do so in moderation and use the following guidelines:  Women should not have more than one drink per day, and men should not have more than two drinks per day. One drink is equal to:  12 oz of beer.  5 oz of wine.  1 oz of hard liquor.  Do not drink on an empty stomach.  Keep yourself hydrated. Have water, diet soda, or unsweetened iced tea.  Regular soda, juice, and other mixers might contain a lot of carbohydrates and should be counted. WHAT FOODS ARE NOT RECOMMENDED? As you make food choices, it is important to remember that all foods are not the same. Some foods have fewer nutrients per serving than other  foods, even though they might have the same number of calories or carbohydrates. It is difficult to get your body what it needs when you eat foods with fewer nutrients. Examples of foods that you should avoid that are high in calories and carbohydrates but low in nutrients include:  Trans fats (most processed foods list trans fats on the Nutrition Facts label).  Regular soda.  Juice.  Candy.  Sweets, such as cake, pie, doughnuts, and cookies.  Fried foods. WHAT FOODS CAN I EAT? Have nutrient-rich foods, which will nourish your body and keep you healthy. The food you should eat also will depend on several factors, including:  The calories you need.  The medicines you take.  Your weight.  Your blood glucose level.  Your blood pressure level.  Your cholesterol level. You also should eat a variety of foods, including:  Protein, such as meat, poultry, fish, tofu, nuts, and seeds (lean animal proteins are best).  Fruits.  Vegetables.  Dairy products, such as milk, cheese, and yogurt (low fat is best).  Breads, grains, pasta, cereal, rice, and beans.  Fats such as olive oil, trans fat-free margarine, canola oil, avocado, and olives. DOES EVERYONE WITH DIABETES MELLITUS HAVE THE SAME MEAL PLAN? Because every person with diabetes mellitus is different, there is not one meal plan that works for everyone. It is very important that you meet with a dietitian who will help you create a meal plan that is just right for you. Document Released: 05/01/2005 Document Revised: 08/09/2013 Document Reviewed: 07/01/2013 Exit[employer] Patient Information 2015 [employer]. This   information is not intended to replace advice given to you by your health care provider. Make sure you discuss any questions you have with your health care provider.  

## 2015-05-21 NOTE — Progress Notes (Signed)
Hospital follow up. Patient in for high sugar. Has mersa and was treated in the hospital. Not taking meds correctly and not eating correctly. Discharged in 05/16/2015 after 2 days.  Not taking lisinopril because it is causing skin problems.

## 2015-05-24 ENCOUNTER — Telehealth: Payer: Self-pay

## 2015-05-24 NOTE — Telephone Encounter (Signed)
This Case Manager placed call to patient to check on status and to discuss need for Transitional Care Clinic follow-up appointment. Unable to reach patient; voicemail left requesting return call.

## 2015-05-31 ENCOUNTER — Telehealth: Payer: Self-pay

## 2015-05-31 NOTE — Telephone Encounter (Signed)
This Case Manager placed call to patient to check on status and to discuss need for Transitional Care follow-up appointment. Placed call to # [phone]. Unable to reach patient; message stated "person you called is unavailable right now."  In addition, placed call to # [phone]; HIPPA compliant voicemail left requesting return call.

## 2015-06-06 ENCOUNTER — Telehealth: Payer: Self-pay

## 2015-06-06 NOTE — Telephone Encounter (Addendum)
Attempted to contact the patient to check on his status and discuss scheduling a follow up appointment at [facility].  [phone] (M) and a voice mail message was left requesting a call back to # [phone] or [phone]. A call was also placed to # [phone] and there was no option to leave a message.

## 2015-06-11 ENCOUNTER — Telehealth: Payer: Self-pay

## 2015-06-11 NOTE — Telephone Encounter (Signed)
Attempted to contact the patient to check on his status.  Call placed to # [phone] (H) and the message noted that the person is not available now , try the call again later.  A call was also placed to #  [phone] (M) and [place] answered the phone noting that she was not with him at this time, she took the call back # [phone] or [phone] to have him return the call.

## 2015-06-14 ENCOUNTER — Telehealth: Payer: Self-pay

## 2015-06-14 NOTE — Telephone Encounter (Signed)
This Case Manager placed call to patient to check on status and to discuss need for follow-up appointment with Dr. Jarold Song. Unable to reach patient at [phone] as message indicated "person you called is unavailable right now." In addition, call placed to [phone]; unable to reach patient. HIPPA compliant voicemail left requesting return call.

## 2015-06-18 ENCOUNTER — Ambulatory Visit: Payer: Medicaid Other | Admitting: Cardiovascular Disease

## 2015-06-18 ENCOUNTER — Encounter: Payer: Self-pay | Admitting: Cardiovascular Disease

## 2015-06-19 ENCOUNTER — Encounter: Payer: Self-pay | Admitting: Cardiovascular Disease

## 2015-06-24 IMAGING — US US RENAL
1 series · 14 of 25 positions shown · non-contrast
Comparison: None.

CLINICAL DATA: Acute kidney injury. History of diabetes mellitus.
Initial encounter.

EXAM:
RENAL / URINARY TRACT ULTRASOUND COMPLETE

[Series 1: us renal · 0.21mm/px · 14 of 32 slices shown]
[im 1/32]
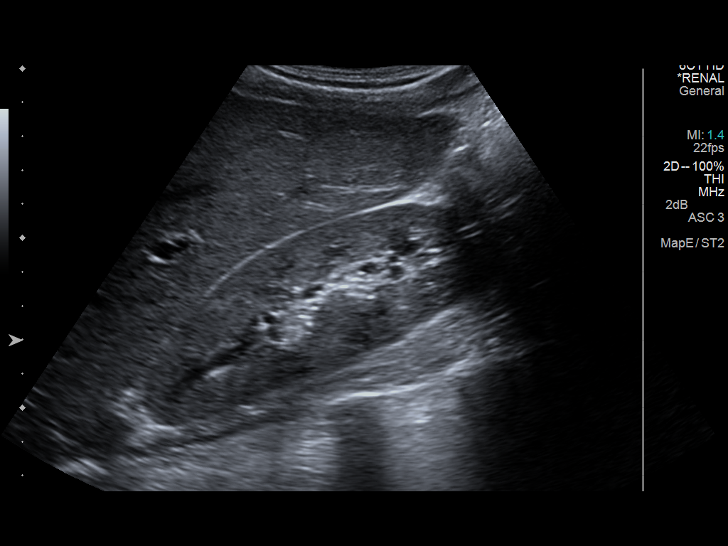
[im 3/32]
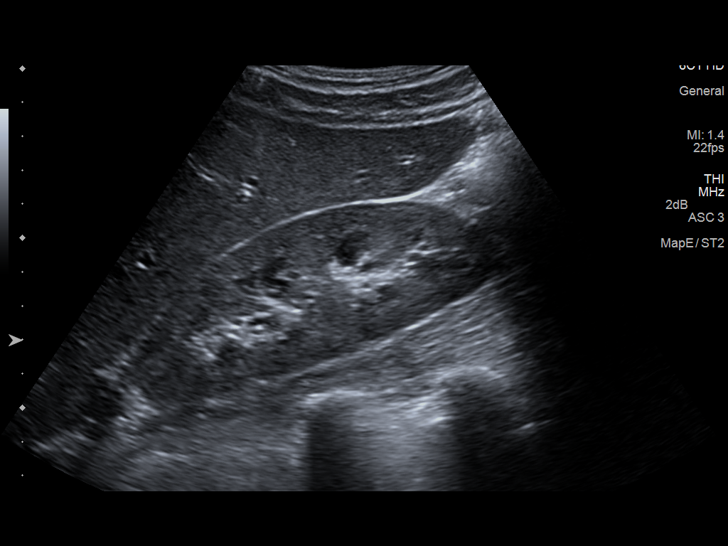
[im 6/32]
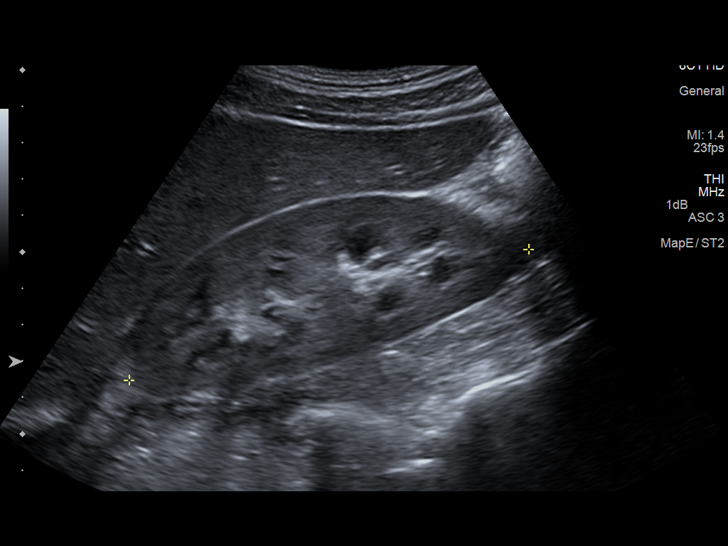
[im 8/32]
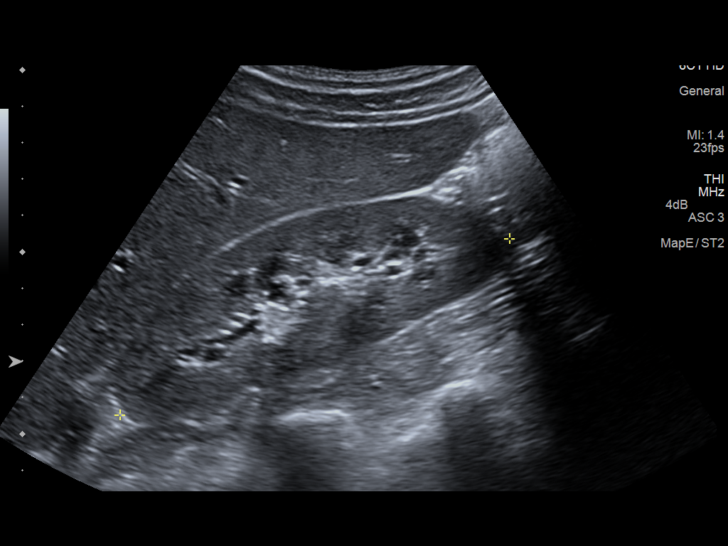
[im 11/32]
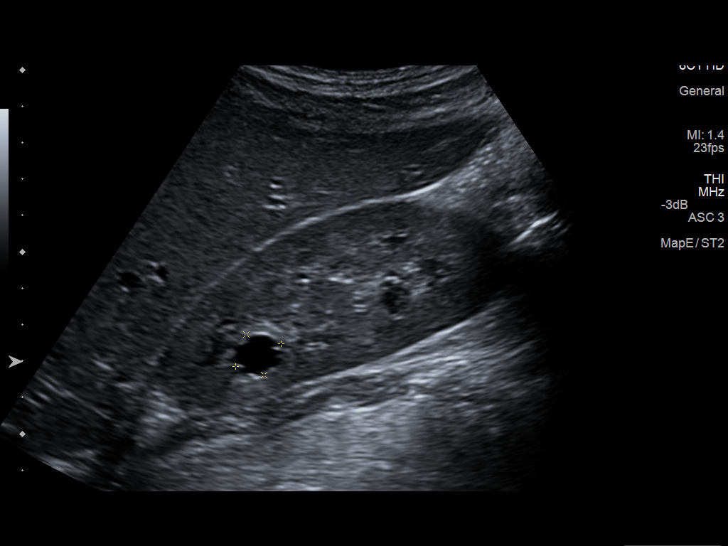
[im 12/32]
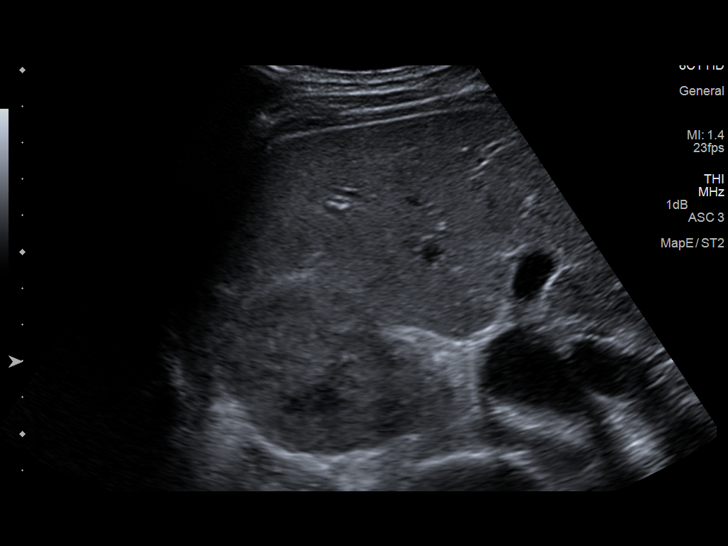
[im 15/32]
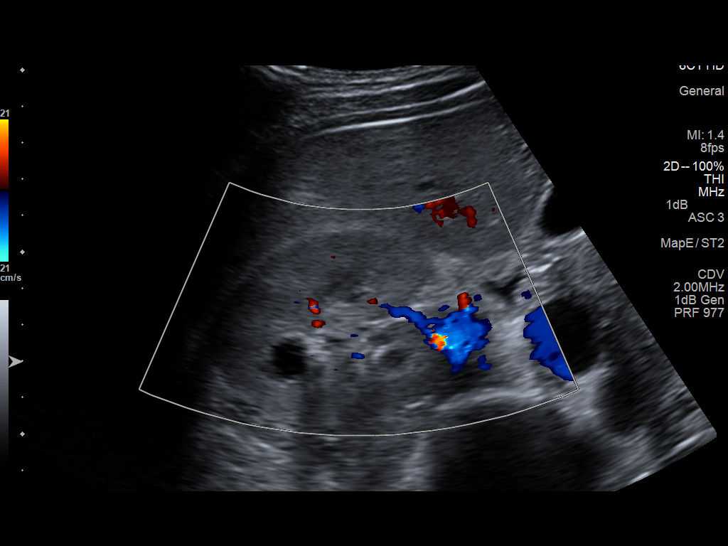
[im 17/32]
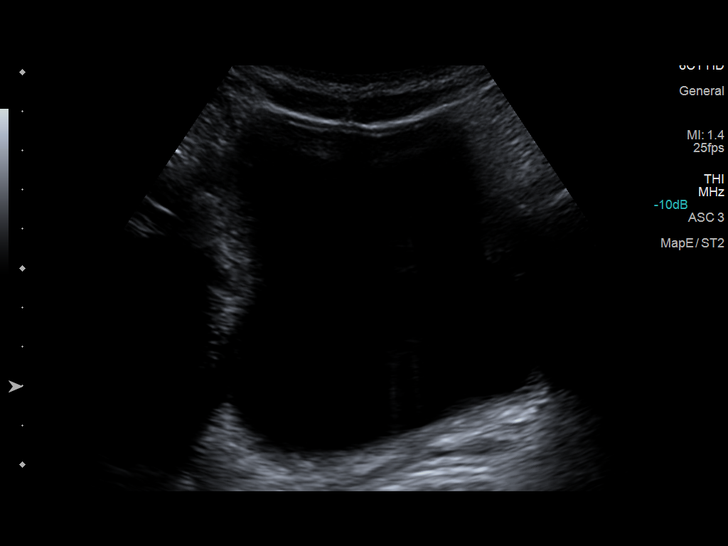
[im 20/32]
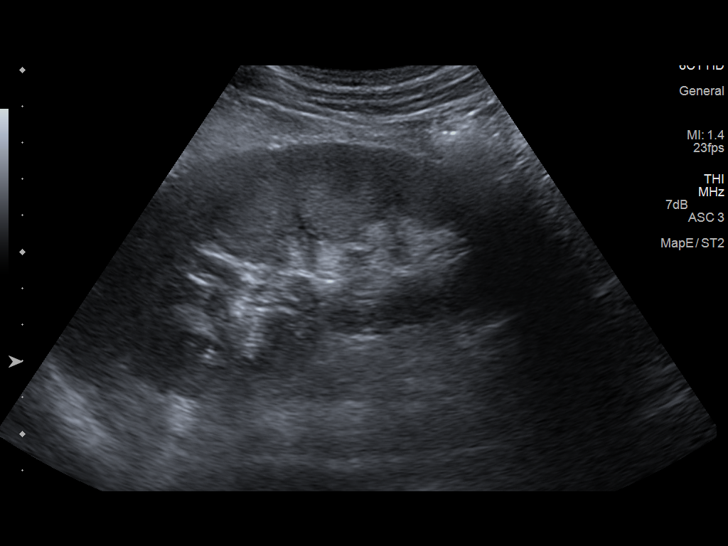
[im 21/32]
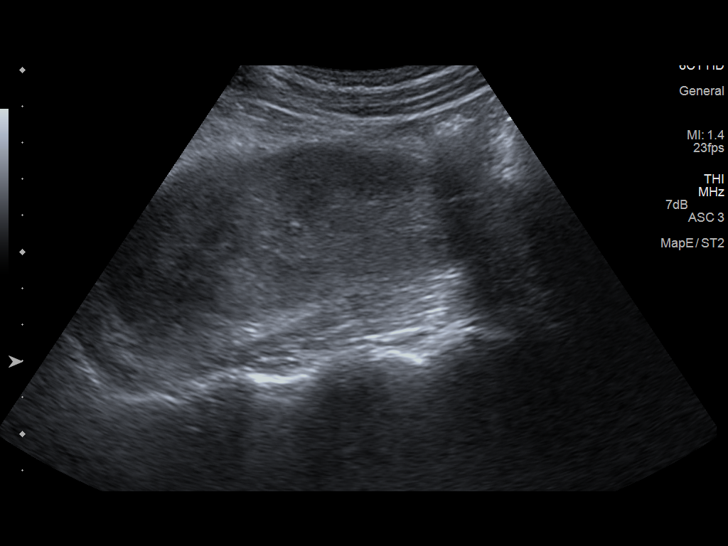
[im 24/32]
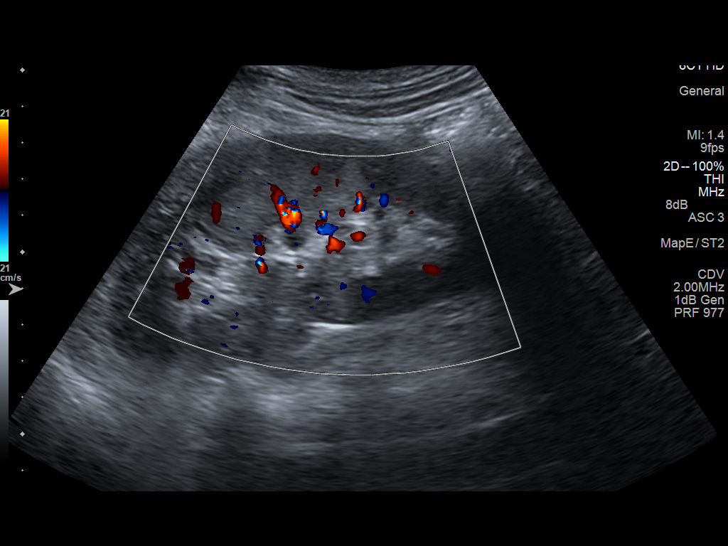
[im 26/32]
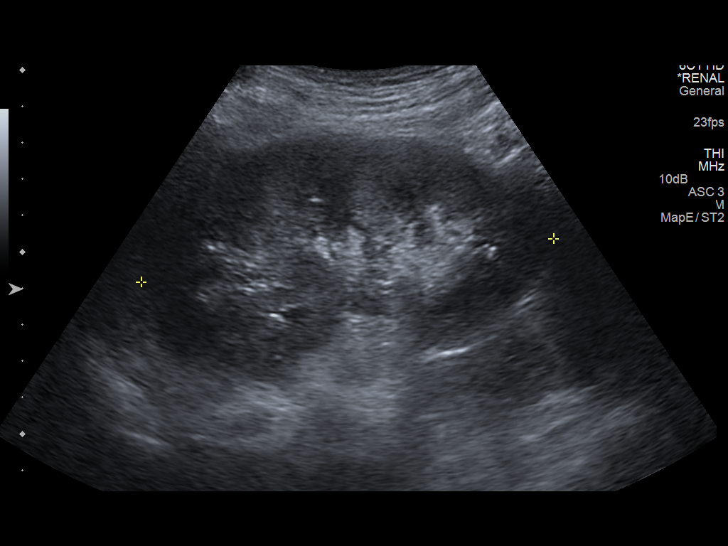
[im 29/32]
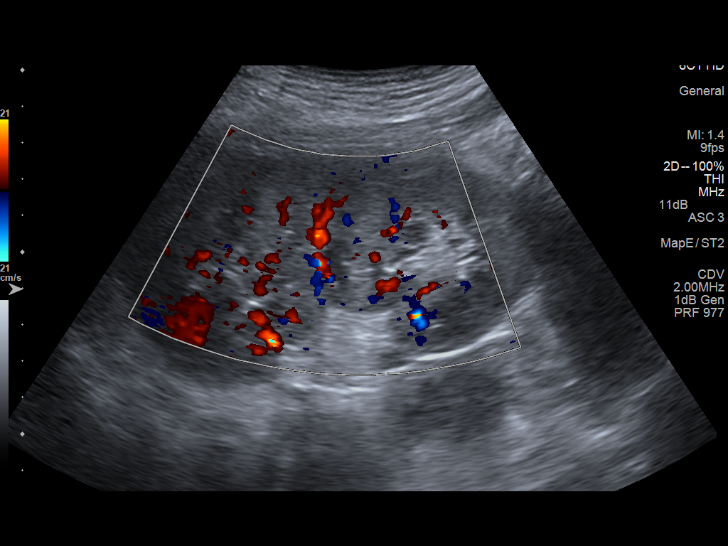
[im 32/32]
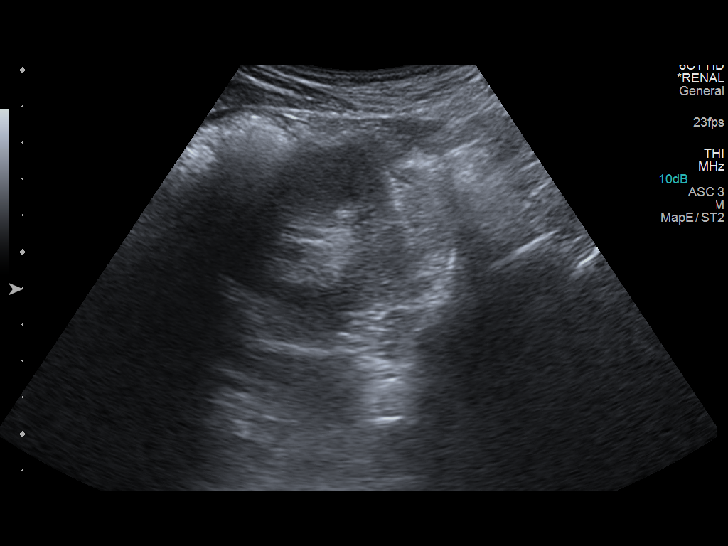

[14 of 25 positions shown; findings below may reference images not displayed]

FINDINGS: Right Kidney:

Length: 11.7 cm. There is a small cyst in the interpolar region
measuring 1.4 cm maximally. No suspicious finding or hydronephrosis.

Left Kidney:

Length: 11.4 cm. Echogenicity within normal limits. No mass or
hydronephrosis visualized.

Bladder:

Appears normal for degree of bladder distention.
IMPRESSION: Small right renal cyst. Otherwise normal renal ultrasound. No
hydronephrosis.

## 2015-06-25 ENCOUNTER — Encounter (HOSPITAL_COMMUNITY): Payer: Self-pay | Admitting: Emergency Medicine

## 2015-06-25 ENCOUNTER — Inpatient Hospital Stay (HOSPITAL_COMMUNITY): Payer: Medicaid Other

## 2015-06-25 ENCOUNTER — Inpatient Hospital Stay (HOSPITAL_COMMUNITY)
Admission: EM | Admit: 2015-06-25 | Discharge: 2015-06-27 | DRG: 638 | Disposition: A | Discharge status: Home / Self Care | Payer: Medicaid Other | Attending: Family Medicine | Admitting: Family Medicine

## 2015-06-25 DIAGNOSIS — Z9111 Patient's noncompliance with dietary regimen: Secondary | ICD-10-CM | POA: Diagnosis not present

## 2015-06-25 DIAGNOSIS — E875 Hyperkalemia: Secondary | ICD-10-CM | POA: Diagnosis present

## 2015-06-25 DIAGNOSIS — M879 Osteonecrosis, unspecified: Secondary | ICD-10-CM | POA: Diagnosis present

## 2015-06-25 DIAGNOSIS — F17210 Nicotine dependence, cigarettes, uncomplicated: Secondary | ICD-10-CM | POA: Diagnosis present

## 2015-06-25 DIAGNOSIS — K219 Gastro-esophageal reflux disease without esophagitis: Secondary | ICD-10-CM | POA: Diagnosis present

## 2015-06-25 DIAGNOSIS — E1010 Type 1 diabetes mellitus with ketoacidosis without coma: Principal | ICD-10-CM | POA: Diagnosis present

## 2015-06-25 DIAGNOSIS — R6510 Systemic inflammatory response syndrome (SIRS) of non-infectious origin without acute organ dysfunction: Secondary | ICD-10-CM | POA: Insufficient documentation

## 2015-06-25 DIAGNOSIS — R5383 Other fatigue: Secondary | ICD-10-CM | POA: Diagnosis present

## 2015-06-25 DIAGNOSIS — L409 Psoriasis, unspecified: Secondary | ICD-10-CM | POA: Diagnosis present

## 2015-06-25 DIAGNOSIS — E876 Hypokalemia: Secondary | ICD-10-CM | POA: Diagnosis not present

## 2015-06-25 DIAGNOSIS — N179 Acute kidney failure, unspecified: Secondary | ICD-10-CM | POA: Diagnosis present

## 2015-06-25 DIAGNOSIS — R402412 Glasgow coma scale score 13-15, at arrival to emergency department: Secondary | ICD-10-CM | POA: Diagnosis present

## 2015-06-25 DIAGNOSIS — Z794 Long term (current) use of insulin: Secondary | ICD-10-CM

## 2015-06-25 DIAGNOSIS — E860 Dehydration: Secondary | ICD-10-CM | POA: Diagnosis present

## 2015-06-25 DIAGNOSIS — Z79899 Other long term (current) drug therapy: Secondary | ICD-10-CM | POA: Diagnosis not present

## 2015-06-25 LAB — I-STAT ARTERIAL BLOOD GAS, ED
Acid-base deficit: 26 mmol/L — ABNORMAL HIGH (ref 0.0–2.0)
BICARBONATE: 3.5 meq/L — AB (ref 20.0–24.0)
O2 SAT: 97 %
pCO2 arterial: 14.6 mmHg — CL (ref 35.0–45.0)
pH, Arterial: 6.99 — CL (ref 7.350–7.450)
pO2, Arterial: 137 mmHg — ABNORMAL HIGH (ref 80.0–100.0)

## 2015-06-25 LAB — BASIC METABOLIC PANEL
ANION GAP: 28 — AB (ref 5–15)
ANION GAP: 14 (ref 5–15)
ANION GAP: 7 (ref 5–15)
ANION GAP: 5 (ref 5–15)
Anion gap: 7 (ref 5–15)
BUN: 23 mg/dL — ABNORMAL HIGH (ref 6–20)
BUN: 24 mg/dL — AB (ref 6–20)
BUN: 17 mg/dL (ref 6–20)
BUN: 14 mg/dL (ref 6–20)
BUN: 12 mg/dL (ref 6–20)
BUN: 11 mg/dL (ref 6–20)
CALCIUM: 8.1 mg/dL — AB (ref 8.9–10.3)
CALCIUM: 7.8 mg/dL — AB (ref 8.9–10.3)
CHLORIDE: 112 mmol/L — AB (ref 101–111)
CHLORIDE: 111 mmol/L (ref 101–111)
CHLORIDE: 112 mmol/L — AB (ref 101–111)
CO2: <7 mmol/L — ABNORMAL LOW (ref 22–32)
CO2: 7 mmol/L — ABNORMAL LOW (ref 22–32)
CO2: 18 mmol/L — ABNORMAL LOW (ref 22–32)
CO2: 24 mmol/L (ref 22–32)
CO2: 24 mmol/L (ref 22–32)
CO2: 21 mmol/L — ABNORMAL LOW (ref 22–32)
CREATININE: 0.98 mg/dL (ref 0.61–1.24)
CREATININE: 0.85 mg/dL (ref 0.61–1.24)
Calcium: 8.7 mg/dL — ABNORMAL LOW (ref 8.9–10.3)
Calcium: 8.1 mg/dL — ABNORMAL LOW (ref 8.9–10.3)
Calcium: 8 mg/dL — ABNORMAL LOW (ref 8.9–10.3)
Calcium: 7.8 mg/dL — ABNORMAL LOW (ref 8.9–10.3)
Chloride: 89 mmol/L — ABNORMAL LOW (ref 101–111)
Chloride: 105 mmol/L (ref 101–111)
Chloride: 112 mmol/L — ABNORMAL HIGH (ref 101–111)
Creatinine, Ser: 3.26 mg/dL — ABNORMAL HIGH (ref 0.61–1.24)
Creatinine, Ser: 2.56 mg/dL — ABNORMAL HIGH (ref 0.61–1.24)
Creatinine, Ser: 1.6 mg/dL — ABNORMAL HIGH (ref 0.61–1.24)
Creatinine, Ser: 1.09 mg/dL (ref 0.61–1.24)
GFR calc Af Amer: 26 mL/min — ABNORMAL LOW (ref >60)
GFR calc Af Amer: 35 mL/min — ABNORMAL LOW (ref >60)
GFR calc Af Amer: >60 mL/min (ref >60)
GFR calc Af Amer: >60 mL/min (ref >60)
GFR calc non Af Amer: 53 mL/min — ABNORMAL LOW (ref >60)
GFR calc non Af Amer: >60 mL/min (ref >60)
GFR calc non Af Amer: >60 mL/min (ref >60)
GFR calc non Af Amer: >60 mL/min (ref >60)
GFR, EST NON AFRICAN AMERICAN: 22 mL/min — AB (ref >60)
GFR, EST NON AFRICAN AMERICAN: 30 mL/min — AB (ref >60)
GLUCOSE: 1009 mg/dL — AB (ref 65–99)
GLUCOSE: 238 mg/dL — AB (ref 65–99)
GLUCOSE: 134 mg/dL — AB (ref 65–99)
GLUCOSE: 103 mg/dL — AB (ref 65–99)
Glucose, Bld: 661 mg/dL (ref 65–99)
Glucose, Bld: 111 mg/dL — ABNORMAL HIGH (ref 65–99)
POTASSIUM: 7 mmol/L — AB (ref 3.5–5.1)
POTASSIUM: 4.4 mmol/L (ref 3.5–5.1)
POTASSIUM: 3.8 mmol/L (ref 3.5–5.1)
POTASSIUM: 3.4 mmol/L — AB (ref 3.5–5.1)
POTASSIUM: 3.5 mmol/L (ref 3.5–5.1)
Potassium: 3.8 mmol/L (ref 3.5–5.1)
SODIUM: 135 mmol/L (ref 135–145)
SODIUM: 140 mmol/L (ref 135–145)
Sodium: 144 mmol/L (ref 135–145)
Sodium: 142 mmol/L (ref 135–145)
Sodium: 141 mmol/L (ref 135–145)
Sodium: 140 mmol/L (ref 135–145)

## 2015-06-25 LAB — I-STAT CG4 LACTIC ACID, ED: LACTIC ACID, VENOUS: 3.87 mmol/L — AB (ref 0.5–2.0)

## 2015-06-25 LAB — URINALYSIS, ROUTINE W REFLEX MICROSCOPIC
Bilirubin Urine: NEGATIVE
Glucose, UA: >1000 mg/dL — AB
LEUKOCYTES UA: NEGATIVE
NITRITE: NEGATIVE
PROTEIN: 30 mg/dL — AB
Specific Gravity, Urine: 1.022 (ref 1.005–1.030)
Urobilinogen, UA: 0.2 mg/dL (ref 0.0–1.0)
pH: 5 (ref 5.0–8.0)

## 2015-06-25 LAB — CBG MONITORING, ED
GLUCOSE-CAPILLARY: 590 mg/dL — AB (ref 65–99)
Glucose-Capillary: >600 mg/dL (ref 65–99)
Glucose-Capillary: 572 mg/dL (ref 65–99)

## 2015-06-25 LAB — I-STAT CHEM 8, ED
BUN: 29 mg/dL — ABNORMAL HIGH (ref 6–20)
BUN: 24 mg/dL — AB (ref 6–20)
CHLORIDE: 108 mmol/L (ref 101–111)
CREATININE: 1.5 mg/dL — AB (ref 0.61–1.24)
Calcium, Ion: 1.02 mmol/L — ABNORMAL LOW (ref 1.12–1.23)
Calcium, Ion: 1.11 mmol/L — ABNORMAL LOW (ref 1.12–1.23)
Chloride: 98 mmol/L — ABNORMAL LOW (ref 101–111)
Creatinine, Ser: 2 mg/dL — ABNORMAL HIGH (ref 0.61–1.24)
Glucose, Bld: >700 mg/dL (ref 65–99)
Glucose, Bld: 596 mg/dL (ref 65–99)
HCT: 45 % (ref 39.0–52.0)
HEMATOCRIT: 51 % (ref 39.0–52.0)
HEMOGLOBIN: 17.3 g/dL — AB (ref 13.0–17.0)
Hemoglobin: 15.3 g/dL (ref 13.0–17.0)
Potassium: 6.6 mmol/L (ref 3.5–5.1)
Potassium: 4.4 mmol/L (ref 3.5–5.1)
SODIUM: 133 mmol/L — AB (ref 135–145)
Sodium: 141 mmol/L (ref 135–145)
TCO2: 5 mmol/L (ref 0–100)
TCO2: 8 mmol/L (ref 0–100)

## 2015-06-25 LAB — GLUCOSE, CAPILLARY
GLUCOSE-CAPILLARY: 347 mg/dL — AB (ref 65–99)
GLUCOSE-CAPILLARY: 288 mg/dL — AB (ref 65–99)
GLUCOSE-CAPILLARY: 235 mg/dL — AB (ref 65–99)
GLUCOSE-CAPILLARY: 168 mg/dL — AB (ref 65–99)
GLUCOSE-CAPILLARY: 160 mg/dL — AB (ref 65–99)
Glucose-Capillary: 451 mg/dL — ABNORMAL HIGH (ref 65–99)
Glucose-Capillary: 206 mg/dL — ABNORMAL HIGH (ref 65–99)

## 2015-06-25 LAB — CBC
HEMATOCRIT: 37.8 % — AB (ref 39.0–52.0)
HEMOGLOBIN: 13 g/dL (ref 13.0–17.0)
MCH: 31.7 pg (ref 26.0–34.0)
MCHC: 34.4 g/dL (ref 30.0–36.0)
MCV: 92.2 fL (ref 78.0–100.0)
Platelets: 327 10*3/uL (ref 150–400)
RBC: 4.1 MIL/uL — AB (ref 4.22–5.81)
RDW: 14.2 % (ref 11.5–15.5)
WBC: 21.4 10*3/uL — AB (ref 4.0–10.5)

## 2015-06-25 LAB — URINE MICROSCOPIC-ADD ON

## 2015-06-25 LAB — CBC WITH DIFFERENTIAL/PLATELET
Basophils Absolute: 0 10*3/uL (ref 0.0–0.1)
Basophils Relative: 0 %
EOS PCT: 0 %
Eosinophils Absolute: 0 10*3/uL (ref 0.0–0.7)
HCT: 47.1 % (ref 39.0–52.0)
HEMOGLOBIN: 14.9 g/dL (ref 13.0–17.0)
LYMPHS PCT: 4 %
Lymphs Abs: 1 10*3/uL (ref 0.7–4.0)
MCH: 32 pg (ref 26.0–34.0)
MCHC: 31.6 g/dL (ref 30.0–36.0)
MCV: 101.3 fL — AB (ref 78.0–100.0)
MONO ABS: 1 10*3/uL (ref 0.1–1.0)
Monocytes Relative: 4 %
NEUTROS PCT: 92 %
Neutro Abs: 23.8 10*3/uL — ABNORMAL HIGH (ref 1.7–7.7)
PLATELETS: 457 10*3/uL — AB (ref 150–400)
RBC: 4.65 MIL/uL (ref 4.22–5.81)
RDW: 14.4 % (ref 11.5–15.5)
WBC: 25.8 10*3/uL — AB (ref 4.0–10.5)

## 2015-06-25 LAB — MRSA PCR SCREENING: MRSA by PCR: NEGATIVE

## 2015-06-25 LAB — PROCALCITONIN: Procalcitonin: 2.44 ng/mL

## 2015-06-25 LAB — LACTIC ACID, PLASMA
LACTIC ACID, VENOUS: 2.2 mmol/L — AB (ref 0.5–2.0)
Lactic Acid, Venous: 1.9 mmol/L (ref 0.5–2.0)

## 2015-06-25 MED ORDER — DEXTROSE-NACL 5-0.45 % IV SOLN: INTRAVENOUS | Status: DC

## 2015-06-25 MED ORDER — ENOXAPARIN SODIUM 40 MG/0.4ML ~~LOC~~ SOLN
40.0000 mg | SUBCUTANEOUS | Status: DC
  Administered 2015-06-25 – 2015-06-26 (×2): 40 mg via SUBCUTANEOUS
  Filled 2015-06-25 (×2): qty 0.4

## 2015-06-25 MED ORDER — ONDANSETRON HCL 4 MG/2ML IJ SOLN
4.0000 mg | Freq: Four times a day (QID) | INTRAMUSCULAR | Status: DC | PRN
  Administered 2015-06-25: 4 mg via INTRAVENOUS
  Filled 2015-06-25: qty 2

## 2015-06-25 MED ORDER — SODIUM CHLORIDE 0.9 % IV SOLN
INTRAVENOUS | Status: DC
  Administered 2015-06-25: 5.4 [IU]/h via INTRAVENOUS
  Filled 2015-06-25: qty 2.5

## 2015-06-25 MED ORDER — SODIUM CHLORIDE 0.9 % IV SOLN: INTRAVENOUS | Status: DC

## 2015-06-25 MED ORDER — LEVOFLOXACIN IN D5W 750 MG/150ML IV SOLN
750.0000 mg | Freq: Once | INTRAVENOUS | Status: DC
  Administered 2015-06-25: 750 mg via INTRAVENOUS
  Filled 2015-06-25: qty 150

## 2015-06-25 MED ORDER — DEXTROSE 5 % IV SOLN
2.0000 g | Freq: Once | INTRAVENOUS | Status: AC
  Administered 2015-06-25: 2 g via INTRAVENOUS
  Filled 2015-06-25: qty 2

## 2015-06-25 MED ORDER — DEXTROSE-NACL 5-0.45 % IV SOLN
INTRAVENOUS | Status: DC
  Administered 2015-06-25: 15:00:00 via INTRAVENOUS

## 2015-06-25 MED ORDER — POTASSIUM CHLORIDE 10 MEQ/100ML IV SOLN
10.0000 meq | INTRAVENOUS | Status: AC
  Administered 2015-06-25 (×2): 10 meq via INTRAVENOUS
  Filled 2015-06-25 (×2): qty 100

## 2015-06-25 MED ORDER — SODIUM CHLORIDE 0.9 % IV SOLN
INTRAVENOUS | Status: DC
  Administered 2015-06-25: 15.6 [IU]/h via INTRAVENOUS
  Filled 2015-06-25: qty 2.5

## 2015-06-25 MED ORDER — SODIUM CHLORIDE 0.9 % IV SOLN
1.0000 g | Freq: Once | INTRAVENOUS | Status: AC
  Administered 2015-06-25: 1 g via INTRAVENOUS
  Filled 2015-06-25: qty 10

## 2015-06-25 MED ORDER — ONDANSETRON HCL 4 MG/2ML IJ SOLN
4.0000 mg | Freq: Once | INTRAMUSCULAR | Status: AC
  Administered 2015-06-25: 4 mg via INTRAVENOUS
  Filled 2015-06-25: qty 2

## 2015-06-25 MED ORDER — SODIUM CHLORIDE 0.9 % IV SOLN
1000.0000 mL | Freq: Once | INTRAVENOUS | Status: AC
  Administered 2015-06-25: 1000 mL via INTRAVENOUS

## 2015-06-25 MED ORDER — SODIUM CHLORIDE 0.9 % IV SOLN
1000.0000 mL | INTRAVENOUS | Status: DC
  Administered 2015-06-25: 1000 mL via INTRAVENOUS

## 2015-06-25 MED ORDER — DEXTROSE 5 % IV SOLN
2.0000 g | Freq: Three times a day (TID) | INTRAVENOUS | Status: DC
  Administered 2015-06-25 – 2015-06-26 (×3): 2 g via INTRAVENOUS
  Filled 2015-06-25 (×4): qty 2

## 2015-06-25 MED ORDER — SODIUM CHLORIDE 0.9 % IV SOLN
INTRAVENOUS | Status: AC
  Administered 2015-06-25: 12:00:00 via INTRAVENOUS

## 2015-06-25 MED ORDER — INSULIN ASPART 100 UNIT/ML IV SOLN
10.0000 [IU] | Freq: Once | INTRAVENOUS | Status: AC
Start: 2015-06-25 — End: 2015-06-25
  Administered 2015-06-25: 10 [IU] via INTRAVENOUS

## 2015-06-25 MED ORDER — SODIUM CHLORIDE 0.9 % IV SOLN
INTRAVENOUS | Status: DC
  Administered 2015-06-25: 10:00:00 via INTRAVENOUS

## 2015-06-25 MED ORDER — SODIUM CHLORIDE 0.9 % IV BOLUS (SEPSIS)
1000.0000 mL | Freq: Once | INTRAVENOUS | Status: AC
  Administered 2015-06-25: 1000 mL via INTRAVENOUS

## 2015-06-25 MED ORDER — ACETAMINOPHEN 325 MG PO TABS: 650.0000 mg | ORAL_TABLET | Freq: Four times a day (QID) | ORAL | Status: DC | PRN

## 2015-06-25 MED ORDER — VANCOMYCIN HCL IN DEXTROSE 1-5 GM/200ML-% IV SOLN
1000.0000 mg | Freq: Once | INTRAVENOUS | Status: AC
  Administered 2015-06-25: 1000 mg via INTRAVENOUS
  Filled 2015-06-25: qty 200

## 2015-06-25 MED ORDER — VANCOMYCIN HCL 500 MG IV SOLR
500.0000 mg | Freq: Two times a day (BID) | INTRAVENOUS | Status: DC
  Administered 2015-06-25 – 2015-06-26 (×2): 500 mg via INTRAVENOUS
  Filled 2015-06-25 (×3): qty 500

## 2015-06-25 MED ORDER — CLOBETASOL PROPIONATE 0.05 % EX CREA
1.0000 "application " | TOPICAL_CREAM | Freq: Two times a day (BID) | CUTANEOUS | Status: DC
  Administered 2015-06-25 – 2015-06-27 (×5): 1 via TOPICAL
  Filled 2015-06-25: qty 15

## 2015-06-25 NOTE — ED Notes (Signed)
Pt. arrived with EMS from home reports elevated blood sugar at home last night with emesis , generalized weakness/fatigue and dry mouth. CBG= 600+ by EMS .

## 2015-06-25 NOTE — ED Notes (Signed)
Pt was given a urinal, but was unable to urinate.

## 2015-06-25 NOTE — ED Provider Notes (Signed)
Medical screening examination/treatment/procedure(s) were conducted as a shared visit with non-physician practitioner(s) and myself.  I personally evaluated the patient during the encounter.   EKG Interpretation   Date/Time:  Monday June 25 2015 06:10:20 EST Ventricular Rate:  133 PR Interval:  103 QRS Duration: 152 QT Interval:  434 QTC Calculation: 646 R Axis:   -177 Text Interpretation:  Sinus tachycardia Consider right atrial enlargement  Right bundle branch block Abnrm T, consider ischemia, anterolateral lds  Confirmed by WARD,  DO, KRISTEN ([id number]) on 06/25/2015 6:15:38 AM      Pt is a 39 y.o. M with h/o IDDM who presents to the emergency department in DKA. Blood sugar greater than 600. He is tachycardic and appears dehydrated on exam. Will give IV fluids, insulin drip. He will need admission.  Pt has Kussmaul's respirations, AMS - mumbling, incoherent. He does open his eyes to voice and will follow commands. Patient will need to be followed closely.  [place], DO 06/25/15 [phone]

## 2015-06-25 NOTE — ED Provider Notes (Signed)
CSN: [id number]     Arrival date & time 06/25/15  0601 History   First MD Initiated Contact with Patient 06/25/15 0615     Chief Complaint  Patient presents with  . Hyperglycemia     (Consider location/radiation/quality/duration/timing/severity/associated sxs/prior Treatment) HPI   Jared Murray Is a 39 year old fragile diabetic who presents emergency Department in DKA. Patient was last admitted for DKA on 05/14/2015. This is a level V caveat due to altered mental status. Initially patient is oriented to person, place, and time. He states that he has been taking his insulin. He has noticed has a urination over the past few days. He denies any symptoms of illness such as dysuria, cough, fever. Patient states that overnight . His status declined. His mother called EMS this morning. Patient is followed by family medicine.  Past Medical History  Diagnosis Date  . Eczema   . Type 1 diabetes mellitus ([place])   . Psoriasis   . GERD (gastroesophageal reflux disease)    Past Surgical History  Procedure Laterality Date  . Finger surgery     Family History  Problem Relation Age of Onset  . Hypertension Father   . Diabetes      multiple  . Lupus Cousin   . Stroke Maternal Grandmother   . Stroke Paternal Grandmother    Social History  Substance Use Topics  . Smoking status: Current Some Day Smoker -- 0.25 packs/day for 10 years    Types: Cigarettes  . Smokeless tobacco: Never Used     Comment: Smoking 2-3 cigs/week  . Alcohol Use: 0.0 oz/week    0 Standard drinks or equivalent per week     Comment: ocassional    Review of Systems  Unable to perform ROS: Mental status change  Hematological: Positive for adenopathy.      Allergies  Penicillins  Home Medications   Prior to Admission medications   Medication Sig Start Date End Date Taking? Authorizing Provider  clobetasol cream (TEMOVATE) 7.84 % Apply 1 application topically 2 (two) times daily. 04/13/15   [name], MD  insulin glargine (LANTUS) 100 UNIT/ML injection Inject 0.3 mLs (30 Units total) into the skin daily. 04/30/15   [name], MD  Insulin Syringe-Needle U-100 (B-D INS SYRINGE 2CC/29GX1/2") 29G X 1/2" 2 ML MISC Check Blood sugar TID and QHS 03/22/14   Olugbemiga E Doreene Burke, MD  lisinopril (PRINIVIL,ZESTRIL) 2.5 MG tablet Take 1 tablet (2.5 mg total) by mouth daily. Patient not taking: Reported on 05/21/2015 04/30/15   [name], MD  NOVOLOG 100 UNIT/ML injection Sliding scale CBG 70 - 120: 0 units CBG 121 - 150: 1 unit,  CBG 151 - 200: 2 units,  CBG 201 - 250: 3 units,  CBG 251 - 300: 5 units,  CBG 301 - 350: 7 units,  CBG 351 - 400: 9 units   CBG > 400: 10 units Patient taking differently: Inject 1-10 Units into the skin 3 (three) times daily with meals. Sliding scale CBG 70 - 120: 0 units CBG 121 - 150: 1 unit,  CBG 151 - 200: 2 units,  CBG 201 - 250: 3 units,  CBG 251 - 300: 5 units,  CBG 301 - 350: 7 units,  CBG 351 - 400: 9 units   CBG > 400: 10 units 10/12/14   Ripudeep K Rai, MD  pantoprazole (PROTONIX) 40 MG tablet Take 1 tablet (40 mg total) by mouth daily. 01/29/15   [name], MD  traMADol (ULTRAM) 50 MG  tablet Take 1 tablet (50 mg total) by mouth every 8 (eight) hours as needed. 02/12/15   [name], MD   There were no vitals taken for this visit. Physical Exam  Constitutional: He is oriented to person, place, and time. He appears lethargic. He appears distressed.  cachectic  HENT:  Head: Normocephalic and atraumatic.  Parched oral mucosa  Neck: Neck supple. No JVD present.  Pulmonary/Chest:  Kussmaul breathing, tachypneic  Abdominal:  Scaphoid abdomen , no tenderness or guarding  Musculoskeletal: Normal range of motion.  Neurological: He is oriented to person, place, and time. He appears lethargic. GCS eye subscore is 3. GCS verbal subscore is 5. GCS motor subscore is 6.  Skin: Skin is dry.  psoriasis  Nursing note and vitals reviewed.   ED Course  .Critical  Care Performed by: Margarita Mail Authorized by: Margarita Mail Total critical care time: 30 minutes Critical care time was exclusive of separately billable procedures and treating other patients. Critical care was necessary to treat or prevent imminent or life-threatening deterioration of the following conditions: dehydration, metabolic crisis and CNS failure or compromise. Critical care was time spent personally by me on the following activities: discussions with consultants, interpretation of cardiac output measurements, evaluation of patient's response to treatment, examination of patient, obtaining history from patient or surrogate, ordering and performing treatments and interventions, ordering and review of laboratory studies, re-evaluation of patient's condition and review of old charts.   (including critical care time) Labs Review Labs Reviewed  CBC WITH DIFFERENTIAL/PLATELET  BASIC METABOLIC PANEL  URINALYSIS, ROUTINE W REFLEX MICROSCOPIC (NOT AT [facility])  CBG MONITORING, ED  I-STAT ARTERIAL BLOOD GAS, ED    Imaging Review No results found. I have personally reviewed and evaluated these images and lab results as part of my medical decision-making.   EKG Interpretation   Date/Time:  Monday June 25 2015 06:10:20 EST Ventricular Rate:  133 PR Interval:  103 QRS Duration: 152 QT Interval:  434 QTC Calculation: 646 R Axis:   -177 Text Interpretation:  Sinus tachycardia Consider right atrial enlargement  Right bundle branch block Abnrm T, consider ischemia, anterolateral lds  Confirmed by WARD,  DO, KRISTEN ([id number]) on 06/25/2015 6:15:38 AM         MDM   Final diagnoses:  None    6:25 AM BP 112/62 mmHg  Pulse 123  Resp 24  SpO2 100%\ EKG shows sinus tachycardia on    6:41 AM Patient evaluated by Dr. Leonides Schanz, GCS now 13 with arousing to voice, confused verbal response, and following simple commands. HR noted to be 190s Leukocytosis is 23.8 Similar to  previous DKA admissions. I discussed with Dr. Leonides Schanz about starting sepsis protocol. Decision made to continue with DKA treatment alone.   6:52 AM BP 112/62 mmHg  Pulse 123  Resp 24  SpO2 100% Patient more alert, asking to drink water.   7:06 AM BP 112/62 mmHg  Pulse 123  Resp 24  SpO2 100%  Patient in AKI, Bicarb is 5, peaked t waves on EKG.   7:16 AM BP 112/62 mmHg  Pulse 123  Resp 24  SpO2 100% Patient glucose 1009.  He has waxing and waning mental status  7:26 AM BP 123/85 mmHg  Pulse 118  Resp 21  SpO2 99%  Patient will be seen here by critical care. ([place]) Vs improving   7:38 AM BP 123/85 mmHg  Pulse 118  Resp 21  SpO2 99% ABG: PH: 6.99 PCO2: 14.6 p02: 137 HCO3: 3.5  BMP shows potassium of 7.0  Bicarb <7  Anion Gap >39    8:58 AM  BP 139/86 mmHg  Pulse 117  Resp 22  SpO2 100% Lactate 3.87 Patient seen by Dr. Nelda Marseille who recommends step down. pateint accepted by Family medicine to stepdown, Pt stable in ED with no significant deterioration in condition.   Margarita Mail, PA-C 06/25/15 0902  Margarita Mail, PA-C 06/25/15 1635

## 2015-06-25 NOTE — ED Notes (Signed)
Lab of i-stat 8 results given to [place]

## 2015-06-25 NOTE — H&P (Signed)
[facility] Admission History and Physical Service Pager: [phone]  Patient name: Jared Murray Medical record number: [id number] Date of birth: [birth year] Age: 39 y.o. Gender: male  Primary Care Provider: Lorayne Marek, MD Consultants: None Code Status: Full  Chief Complaint: DKA  Assessment and Plan: Jared Murray is a 39 y.o. male presenting with DKA. PMH is significant for   T1DM/DKA-  Has poorly controlled T1 DM with last A1c on 05/14/2015 at 14.3. On presentation CBG >600, pH 6.9, AG 39. Unclear what precipitated this episode, as he has no clear infectious etiology with a normal CXR, UA negative for signs of infection. Although he reports compliance with his insulin regimen, he does report that he has not been compliant with diet recommendations - Admit to step down for DKA, Dr Gwendlyn Deutscher Attending - insulin GTT - BMP q2hr, trend gap - MIVF 250 cc/hr  - consider nutrition c/s  - SW for help with obtaining medications  Concern for SIRS- Tachycardia 123 on admission, tachypnea to 26, WBC 25.8, hypothermia to 95 by rectal temp concerning for SIRS. However, as he appeared dry clinically many of his VS derangements could be described by mild dehydration. However Lactate 3.87 in the ED. As above UA and CXR negative for signs of infection - Given concern for hypothemia and potential infection, will start broad spectrum abx,  aztreonam, vanc  - trend lactate - f/u BCx, UCx - f/u CBC  HyperKalemia- K 7 on presentation with down trend to 6.6. EKG on admission showed peaked T waves, s/p calcium gluconate x1 - Repeat EKG in ED showed improvement in T waves - F/u repeat K - admit to telemetry  Concern for AMS-  GCS in ED 13 initially but was noted to [id number]. Mental status noted to be waxing and waning in the ED likely secondary to acidosis of DKA. Was seen by CCM for acidosis and mental status derangement who felt he was stable for dispo to step down as  his mental status was improving - continue to monitor closely - Will contact CCM again if mental status deteriorates  AKI- Cr on admission 3.26 but trended to 2.0, baseline appears <1 however his Cr has trended above 1 on multiple previous instances - follow Cr  Psoriasis- Stable  - Continue home clobetasol cream  Avascular necrosis- Stable with outpatient provider aware of this and has referred him to orthopedics to be worked up as an outpatient - continue home tramadol for pain  FEN/GI: NPO, while on insulin drip Prophylaxis: Lovenox ppx  Disposition: Step down, with telemetry, Dr Gwendlyn Deutscher attending  History of Present Illness:  Jared LAYSON Murray is a 39 y.o. male presented to the ED for concern of dehydration, however upon arriving was found to be in DKA  He reports nausea for 2 hours last night 11/6 starting at 11 pm. He reports " spitting" a small amount of brown liquid but did not feel it was true vomit. He did feel as if he had a fever last night but did not take his temperature.  Otherwise he has been able to tolerate PO, denies N/V outside of that two hour period last night, denies abd pain, dysuria, chest pain, SOB or headache, denies neck tenderness or stiffness He reports compliance with insulin regimen and takes 26 units of lantus daily with sliding scale. He feels he is in DKA because of wife keeps bringing him sugary drinks like kool-aid and making unhealthy, carbohydrate filled foods. This also is  a source of contention and they have had multple arguments over his dietary needs. He feels this stressor is also a cause for his blood sugar being uncontrolled  Significantly, in the ED he was noted to have altered metal status which was waxing and waning in nature, given this with his profound acidosis, CCM was called to see him and they felt he was stable to to sent to step down  Review Of Systems: Per HPI    Patient Active Problem List   Diagnosis Date Noted  . Avascular  necrosis of bone of hip ([place])   . DKA (diabetic ketoacidosis) ([place]) 05/14/2015  . Diabetic ketoacidosis associated with other specified diabetes mellitus ([place])   . Lactic acidosis   . Cutaneous abscess of chest wall   . Diabetes type 1, uncontrolled ([place])   . DKA, type 1, not at goal [facility]) 04/03/2015  . Chest wall abscess 04/03/2015  . Avascular necrosis ([place]) 02/12/2015  . Type 1 diabetes mellitus ([place]) 01/29/2015  . Sepsis ([place]) 01/22/2015  . GERD (gastroesophageal reflux disease) 01/22/2015  . AKI (acute kidney injury) ([place]) 01/22/2015  . Type II diabetes mellitus with complication, uncontrolled ([place]) 01/22/2015  . Hyperkalemia   . Viral gastroenteritis 07/01/2014  . Left hip pain 04/18/2014  . Dehydration 01/23/2014  . Type II or unspecified type diabetes mellitus with unspecified complication, uncontrolled 07/08/2013  . Psoriasis 07/01/2013  . Loss of weight 03/17/2013  . Hypokalemia 03/17/2013  . Eczema 03/17/2013  . Protein-calorie malnutrition, severe ([place]) 03/17/2013    Past Medical History: Past Medical History  Diagnosis Date  . Eczema   . Type 1 diabetes mellitus ([place])   . Psoriasis   . GERD (gastroesophageal reflux disease)     Past Surgical History: Past Surgical History  Procedure Laterality Date  . Finger surgery      Social History: Social History  Substance Use Topics  . Smoking status: Current Some Day Smoker -- 0.25 packs/day for 10 years    Types: Cigarettes  . Smokeless tobacco: Never Used     Comment: Smoking 2-3 cigs/week  . Alcohol Use: 0.0 oz/week    0 Standard drinks or equivalent per week     Comment: ocassional   Additional social history: reports occasional etoh 2 times per week up to 4 cocktail drinks, smokes 9 cigs per week, denies drug use Please also refer to relevant sections of EMR.  Family History: Family History  Problem Relation Age of Onset  . Hypertension Father   . Diabetes      multiple  . Lupus Cousin   . Stroke  Maternal Grandmother   . Stroke Paternal Grandmother     Allergies and Medications: Allergies  Allergen Reactions  . Penicillins Other (See Comments)    Childhood allergy   No current facility-administered medications on file prior to encounter.   Current Outpatient Prescriptions on File Prior to Encounter  Medication Sig Dispense Refill  . insulin glargine (LANTUS) 100 UNIT/ML injection Inject 0.3 mLs (30 Units total) into the skin daily. 10 mL 2  . NOVOLOG 100 UNIT/ML injection Sliding scale CBG 70 - 120: 0 units CBG 121 - 150: 1 unit,  CBG 151 - 200: 2 units,  CBG 201 - 250: 3 units,  CBG 251 - 300: 5 units,  CBG 301 - 350: 7 units,  CBG 351 - 400: 9 units   CBG > 400: 10 units (Patient taking differently: Inject 1-10 Units into the skin 3 (three) times daily with  meals. Sliding scale CBG 70 - 120: 0 units CBG 121 - 150: 1 unit,  CBG 151 - 200: 2 units,  CBG 201 - 250: 3 units,  CBG 251 - 300: 5 units,  CBG 301 - 350: 7 units,  CBG 351 - 400: 9 units   CBG > 400: 10 units) 2 vial 12  . pantoprazole (PROTONIX) 40 MG tablet Take 1 tablet (40 mg total) by mouth daily. 30 tablet 2  . clobetasol cream (TEMOVATE) 9.51 % Apply 1 application topically 2 (two) times daily. (Patient not taking: Reported on 06/25/2015) 30 g 2  . Insulin Syringe-Needle U-100 (B-D INS SYRINGE 2CC/29GX1/2") 29G X 1/2" 2 ML MISC Check Blood sugar TID and QHS 100 each 12  . lisinopril (PRINIVIL,ZESTRIL) 2.5 MG tablet Take 1 tablet (2.5 mg total) by mouth daily. (Patient not taking: Reported on 05/21/2015) 30 tablet 2  . traMADol (ULTRAM) 50 MG tablet Take 1 tablet (50 mg total) by mouth every 8 (eight) hours as needed. (Patient not taking: Reported on 06/25/2015) 40 tablet 1    Objective: BP 139/86 mmHg  Pulse 117  Resp 22  SpO2 100% Exam: General: NAD, lying in bed HEENT: PERRL, dry mucous membranes, NCAT, no nuchal rigidity Cardiovascular: Tachycardic, reg rhythm, no murmurs auscultated Respiratory: CTAB, normal  WOB Abdomen: soft, non tender, no organomegally Skin: flaky appearing plaques over arms, stomach, back and legs, no signs of super infection Neuro: no focal deficits, AOx4 Psych: normal mood and affect  Labs and Imaging: CBC BMET   Recent Labs Lab 06/25/15 0626 06/25/15 0657  WBC 25.8*  --   HGB 14.9 17.3*  HCT 47.1 51.0  PLT 457*  --     Recent Labs Lab 06/25/15 0626 06/25/15 0657  NA 135 133*  K 7.0* 6.6*  CL 89* 98*  CO2 <7*  --   BUN 23* 29*  CREATININE 3.26* 2.00*  GLUCOSE 1009* >700*  CALCIUM 8.7*  --      CXR 11/7  No acute chest abnormality.   Veatrice Bourbon, MD 06/25/2015, 8:37 AM PGY-2, [place] [facility] pager: [phone], text pages welcome

## 2015-06-25 NOTE — Progress Notes (Signed)
FPTS Interim Progress Note  S: Checked on patient this afternoon for re-evaluation following admission this morning. Patient remains on insulin drip protocol. He was sleeping on arrival. Woke up and reports that he continues to do better, still feels "tired", denied any other complaints at this time, no pain, nausea, vomiting, fevers/chills, abdominal pain, SOB. Per earlier evaluations he was reported to be hungry and felt "dehydrated" requesting something to drink.   O: BP 99/71 mmHg  Pulse 96  Temp(Src) 97.7 F (36.5 C) (Oral)  Resp 14  Ht 5\' 10"  (1.778 m)  Wt 119 lb (53.978 kg)  BMI 17.07 kg/m2  SpO2 100%   Gen - sleeping comfortably, wakes up to voice, NAD HEENT - dry cracked lips with dry mucus membranes Heart - RRR, no murmurs Lungs - CTAB Abd - soft, NTND, +active BS Skin - dry, warm Neuro - drowsy upon arousal to voice stimuli, improved alertness from earlier, oriented, follows commands  A/P: Briefly, Jared Murray is a 3 yr M admitted earlier today 11/7 in mod-severe DKA with uncontrolled type 1 diabetes (recent a1c >14). On admission with severe acidosis with pH 6.9 on ABG and AG 39. Has since been on insulin drip protocol for DKA.  # Mod-severe DKA, in setting of uncontrolled T1DM with possible infection of unclear etiology - Improving - Unclear exact trigger for DKA but likely reduced PO and dehydration with underlying possible infection - Clinically remains drowsy but gradually improving mental status from admission now DKA is clearing. Also respirations improving from max RR 27 down to 23. - Improving AG 39 > 28 > 14 (now nearly closed x 1) - Continue on insulin drip and IVF per DKA protocol, anticipate transition IVF to D5 now CBGs < 250s - Continue monitor BMET q 2 hr for now until AG closed x 2, then transition to SQ insulin with Lantus start at 20u daily (home 26 to 30u) with SSI, will likely escalate diet at that time  # SIRs, rule out Sepsis in setting of  DKA - initially on admit with hypothermia, tachycardia, and elevated WBC to 25 - Temp seems to be stabilizing now. HR improving - CXR negative - Following pan-cultures with Blood x 2 and Urine prior to antibiotics - Continue BSA since ED with Vanc IV and Aztreonam (s/p Levaquin x 1 dose in ED) - Tylenol 650 q 6 hr PRN fever - Monitor WBC, fever curve  # Lactic acidosis, with dehydration and SIRs - improving - Likely from dehydration - On admit lactic acid up to 3.87, now clearing with hydration down to 2.2  # AKI, pre-renal with severe dehydration in DKA - Improving - Elevated BUN:Cr consistent with pre-renal etiology of dehydration - Admit SCr up to 3.26 from b/l 0.7, now SCr trending down to 1.6 with IVF rehydration - Continue IVF per DKA protocol - Also on clear liquid diet - Monitor on BMET  # Nausea / Vomiting, secondary to DKA - Zofran PRN  # Hyperkalemia, in setting of DKA - Resolved - On admit up to K 6.6, s/p insulin drip therapy (also given Ca gluconate) as had peaked T wave on initial EKG - K improving down to 4s now 3.8, still correcting DKA and may continue to decline - Add IV K runs 10 mEq x 2 - Monitor on BMET q 2 hr  Olin Hauser, DO 06/25/2015, 5:07 PM PGY-3, [place] [facility] Service pager [phone]

## 2015-06-25 NOTE — Progress Notes (Signed)
Utilization Review Completed.  

## 2015-06-25 NOTE — ED Notes (Signed)
Pt CBG result was "HI". Mortimer Fries - RN was informed.

## 2015-06-25 NOTE — Progress Notes (Signed)
ANTIBIOTIC CONSULT NOTE - INITIAL  Pharmacy Consult for vancomycin + aztreonam Indication: rule out sepsis  Allergies  Allergen Reactions  . Penicillins Other (See Comments)    Childhood allergy    Patient Measurements: Weight: 137 lb 12.6 oz (62.5 kg)  Vital Signs: Temp: 95 F (35 C) (11/07 0914) Temp Source: Rectal (11/07 0914) BP: 136/89 mmHg (11/07 0915) Pulse Rate: 108 (11/07 0915) Intake/Output from previous day:   Intake/Output from this shift: Total I/O In: 1000 [I.V.:1000] Out: -   Labs:  Recent Labs  06/25/15 0626 06/25/15 0657  WBC 25.8*  --   HGB 14.9 17.3*  PLT 457*  --   CREATININE 3.26* 2.00*   Estimated Creatinine Clearance: 43.8 mL/min (by C-G formula based on Cr of 2). No results for input(s): VANCOTROUGH, VANCOPEAK, VANCORANDOM, GENTTROUGH, GENTPEAK, GENTRANDOM, TOBRATROUGH, TOBRAPEAK, TOBRARND, AMIKACINPEAK, AMIKACINTROU, AMIKACIN in the last 72 hours.   Microbiology: No results found for this or any previous visit (from the past 720 hour(s)).  Medical History: Past Medical History  Diagnosis Date  . Eczema   . Type 1 diabetes mellitus ([place])   . Psoriasis   . GERD (gastroesophageal reflux disease)     Assessment: 39 yo m presenting to the ED on 11/7 in DKA.  Patient has SIRS criteria, so pharmacy is consulted to dose vancomycin and aztreonam (PCN allergy) for possible sepsis. Patient has received a 750 mg load of levaquin, a 1 gm load of vancomycin, and a 2 gm load of aztreonam in the ED already.   Wbc 25.8, hypothermic, SCr 3.26>2 (last SCr 0.71 in September), CrCl ~ 43 ml/min.   Levaquin x 1 11/7 Vanc 11/7 >> Aztreonam 11/7 >>  11/7 BCx: sent 11/7 UCx: sent  Goal of Therapy:  Vancomycin trough level 15-20 mcg/ml  Plan:  Vancomycin 500 mg IV q12h Aztreonam 2gm IV q8h Monitor renal function closely F/u cx, CBC, temperature curve De-escalate when clinically possible  Cassie L. Nicole Kindred, PharmD PGY2 Infectious Diseases  Pharmacy Resident Pager: [phone] 06/25/2015 10:22 AM

## 2015-06-26 LAB — CBC
HCT: 33.2 % — ABNORMAL LOW (ref 39.0–52.0)
HEMATOCRIT: 35.1 % — AB (ref 39.0–52.0)
HEMOGLOBIN: 11.6 g/dL — AB (ref 13.0–17.0)
HEMOGLOBIN: 12 g/dL — AB (ref 13.0–17.0)
MCH: 31.1 pg (ref 26.0–34.0)
MCH: 30.7 pg (ref 26.0–34.0)
MCHC: 34.9 g/dL (ref 30.0–36.0)
MCHC: 34.2 g/dL (ref 30.0–36.0)
MCV: 89 fL (ref 78.0–100.0)
MCV: 89.8 fL (ref 78.0–100.0)
PLATELETS: 251 10*3/uL (ref 150–400)
Platelets: 276 10*3/uL (ref 150–400)
RBC: 3.73 MIL/uL — ABNORMAL LOW (ref 4.22–5.81)
RBC: 3.91 MIL/uL — AB (ref 4.22–5.81)
RDW: 13.8 % (ref 11.5–15.5)
RDW: 14.1 % (ref 11.5–15.5)
WBC: 13.2 10*3/uL — AB (ref 4.0–10.5)
WBC: 8.7 10*3/uL (ref 4.0–10.5)

## 2015-06-26 LAB — GLUCOSE, CAPILLARY
GLUCOSE-CAPILLARY: 102 mg/dL — AB (ref 65–99)
GLUCOSE-CAPILLARY: 77 mg/dL (ref 65–99)
GLUCOSE-CAPILLARY: 96 mg/dL (ref 65–99)
Glucose-Capillary: 107 mg/dL — ABNORMAL HIGH (ref 65–99)
Glucose-Capillary: 108 mg/dL — ABNORMAL HIGH (ref 65–99)
Glucose-Capillary: 150 mg/dL — ABNORMAL HIGH (ref 65–99)
Glucose-Capillary: 160 mg/dL — ABNORMAL HIGH (ref 65–99)
Glucose-Capillary: 165 mg/dL — ABNORMAL HIGH (ref 65–99)
Glucose-Capillary: 75 mg/dL (ref 65–99)
Glucose-Capillary: 92 mg/dL (ref 65–99)
Glucose-Capillary: 97 mg/dL (ref 65–99)

## 2015-06-26 LAB — URINE CULTURE: Culture: NO GROWTH

## 2015-06-26 LAB — BASIC METABOLIC PANEL
Anion gap: 7 (ref 5–15)
BUN: 8 mg/dL (ref 6–20)
CALCIUM: 8 mg/dL — AB (ref 8.9–10.3)
CO2: 23 mmol/L (ref 22–32)
CREATININE: 0.76 mg/dL (ref 0.61–1.24)
Chloride: 110 mmol/L (ref 101–111)
GFR calc Af Amer: >60 mL/min (ref >60)
Glucose, Bld: 121 mg/dL — ABNORMAL HIGH (ref 65–99)
POTASSIUM: 3.2 mmol/L — AB (ref 3.5–5.1)
SODIUM: 140 mmol/L (ref 135–145)

## 2015-06-26 MED ORDER — DEXTROSE 5 % IV SOLN
1.0000 g | Freq: Three times a day (TID) | INTRAVENOUS | Status: DC
  Administered 2015-06-26: 1 g via INTRAVENOUS
  Filled 2015-06-26 (×2): qty 1

## 2015-06-26 MED ORDER — INSULIN GLARGINE 100 UNIT/ML ~~LOC~~ SOLN
20.0000 [IU] | Freq: Every day | SUBCUTANEOUS | Status: DC
  Administered 2015-06-26 (×2): 20 [IU] via SUBCUTANEOUS
  Filled 2015-06-26 (×3): qty 0.2

## 2015-06-26 MED ORDER — INSULIN REGULAR HUMAN 100 UNIT/ML IJ SOLN
INTRAMUSCULAR | Status: DC
Start: 2015-06-26 — End: 2015-06-26

## 2015-06-26 MED ORDER — INSULIN ASPART 100 UNIT/ML ~~LOC~~ SOLN
0.0000 [IU] | Freq: Three times a day (TID) | SUBCUTANEOUS | Status: DC
  Administered 2015-06-26: 3 [IU] via SUBCUTANEOUS
  Administered 2015-06-27: 8 [IU] via SUBCUTANEOUS

## 2015-06-26 MED ORDER — SODIUM CHLORIDE 0.9 % IV SOLN
INTRAVENOUS | Status: DC
Start: 2015-06-26 — End: 2015-06-26
  Filled 2015-06-26: qty 2.5

## 2015-06-26 MED ORDER — POTASSIUM CHLORIDE CRYS ER 20 MEQ PO TBCR
40.0000 meq | EXTENDED_RELEASE_TABLET | Freq: Once | ORAL | Status: AC
  Administered 2015-06-26: 40 meq via ORAL
  Filled 2015-06-26: qty 2

## 2015-06-26 MED ORDER — INSULIN ASPART 100 UNIT/ML ~~LOC~~ SOLN: 0.0000 [IU] | Freq: Every day | SUBCUTANEOUS | Status: DC

## 2015-06-26 MED ORDER — SODIUM CHLORIDE 0.9 % IV SOLN
INTRAVENOUS | Status: AC
  Administered 2015-06-26: 03:00:00 via INTRAVENOUS

## 2015-06-26 MED ORDER — VANCOMYCIN HCL 500 MG IV SOLR
500.0000 mg | Freq: Three times a day (TID) | INTRAVENOUS | Status: DC
  Administered 2015-06-26: 500 mg via INTRAVENOUS
  Filled 2015-06-26 (×2): qty 500

## 2015-06-26 NOTE — Progress Notes (Signed)
FPTS Interim Progress Note  Lab result update (see previous interim progress note for further details on plan): Last BMET @ 2320 with continued closed AG, K stable 3.8, SCr normalized 0.85. - New orders placed to transition to SQ insulin, discussed with RN - Start Lantus SQ 20u now, add mod SSI +QHS, routine CBG checks - Discontinue Insulin drip + D5 IVF, about 2 hr after give Lantus, approx 0200 - Adv diet to carb modified - Check BMET routinely at 0500 - Switch IVF to 75 cc/hr NS for about 10 hr after comes off insulin drip, until demonstrates can improve PO - additionally, follow-up blood cultures and fever curve on 11/8, determine course of broad spectrum abx  Olin Hauser, DO 06/26/2015, 12:15 AM PGY-3, [facility] pager [phone]

## 2015-06-26 NOTE — Progress Notes (Signed)
Problem with CBG machine. Blood Glucose results for today are as follows:   800: 75 1200: 107 1700: 165  Contacting point of care to correct this data transfer problem. Milford Cage, RN

## 2015-06-26 NOTE — Hospital Discharge Follow-Up (Signed)
The patient has been followed at the [facility]. Will monitor his hospital course and meet with him to discuss plans for further follow up after discharge.

## 2015-06-26 NOTE — Progress Notes (Signed)
ANTIBIOTIC CONSULT NOTE - follow up Pharmacy Consult for vancomycin + aztreonam Indication: rule out sepsis  Allergies  Allergen Reactions  . Penicillins Other (See Comments)    Childhood allergy    Patient Measurements: Height: 5\' 10"  (177.8 cm) Weight: 119 lb (53.978 kg) IBW/kg (Calculated) : 73  Vital Signs: Temp: 97.8 F (36.6 C) (11/08 0757) Temp Source: Oral (11/08 0757) BP: 110/79 mmHg (11/08 0757) Pulse Rate: 95 (11/08 0757) Intake/Output from previous day: 11/07 0701 - 11/08 0700 In: 4267.7 [P.O.:360; I.V.:3707.7; IV Piggyback:200] Out: 1500 [Urine:1500] Intake/Output from this shift: Total I/O In: 240 [P.O.:240] Out: -   Labs:  Recent Labs  06/25/15 0626  06/25/15 1058 06/25/15 1533  06/25/15 2130 06/25/15 2320 06/26/15 0326  WBC 25.8*  --   --  21.4*  --   --   --  13.2*  HGB 14.9  < > 15.3 13.0  --   --   --  11.6*  PLT 457*  --   --  327  --   --   --  276  CREATININE 3.26*  < > 1.50* 1.60*  < > 0.98 0.85 0.76  < > = values in this interval not displayed. Estimated Creatinine Clearance: 94.7 mL/min (by C-G formula based on Cr of 0.76). No results for input(s): VANCOTROUGH, VANCOPEAK, VANCORANDOM, GENTTROUGH, GENTPEAK, GENTRANDOM, TOBRATROUGH, TOBRAPEAK, TOBRARND, AMIKACINPEAK, AMIKACINTROU, AMIKACIN in the last 72 hours.   Microbiology: Recent Results (from the past 720 hour(s))  Urine culture     Status: None   Collection Time: 06/25/15  9:13 AM  Result Value Ref Range Status   Specimen Description URINE, RANDOM  Final   Special Requests ADDED [id number]  Final   Culture NO GROWTH 1 DAY  Final   Report Status 06/26/2015 FINAL  Final  MRSA PCR Screening     Status: None   Collection Time: 06/25/15 11:33 AM  Result Value Ref Range Status   MRSA by PCR NEGATIVE NEGATIVE Final    Comment:        The GeneXpert MRSA Assay (FDA approved for NASAL specimens only), is one component of a comprehensive MRSA colonization surveillance program. It is  not intended to diagnose MRSA infection nor to guide or monitor treatment for MRSA infections.     Medical History: Past Medical History  Diagnosis Date  . Eczema   . Type 1 diabetes mellitus ([place])   . Psoriasis   . GERD (gastroesophageal reflux disease)     Assessment:  39 yo m presenting to the ED on 11/7 in DKA.  Patient has SIRS criteria, so pharmacy is consulted to dose vancomycin and aztreonam (PCN allergy) for possible sepsis. Wt 54 kg  Wbc 25.8>21.4>13.2, hypothermic af first, now AF. SCr 3.26>2 >1.6>0.76 (last SCr 0.71 in September) lactate 2.2>1.9  11/7 CXR neg  Levaquin x 1 11/7 Vanc 11/7 >> Aztreonam 11/7 >>  11/7 BCx: sent 11/7 UCx:ngf  Goal of Therapy:  Vancomycin trough level 15-20 mcg/ml  Plan:  Increase Vancomycin to 500 mg IV q8h Decrease Aztreonam to 1gm IV q8h Monitor renal function closely F/u cx, CBC, temperature curve De-escalate when clinically possible  Eudelia Bunch, Pharm.D. [phone] 06/26/2015 9:58 AM

## 2015-06-26 NOTE — Progress Notes (Signed)
Family Medicine Teaching Service Daily Progress Note Intern Pager: [phone]  Patient name: Jared Murray Medical record number: [id number] Date of birth: 10-20-1975 Age: 39 y.o. Gender: male  Primary Care Provider: Lorayne Marek, MD Consultants: none Code Status: FULL  Pt Overview and Major Events to Date:  11/7: Admitted for DKA   Assessment and Plan: Jared Murray is a 39 y.o. male presenting with DKA. PMH is significant for type 1 DM,sporiasis.   T1DM/DKA- Has poorly controlled T1 DM with last A1c on 05/14/2015 at 14.3. On presentation CBG >600, pH 6.9, AG 39. Unclear what precipitated this episode, as he has no clear infectious etiology with a normal CXR, UA negative for signs of infection. Report that he has not been compliant with diet recommendations. Switched to sub Q Lantus 20 units 11/8 morning after closing of AG x 3 with CBG 121 since.  - in step down will likely transfer to floor today - switched to sub Q Lantus 20 units 11/8 morning; moderate SSI until patient starts eating  - Hypokalemia this morning 3.2 please see below - SW for help with obtaining medications  Concern for SIRS- Initial presentation: Tachycardia 123 on admission, tachypnea to 26, WBC 25.8, hypothermia to 95 by rectal temp concerning for SIRS. However, as he appeared dry clinically many of his VS derangements could be described by mild dehydration. However Lactate 3.87 in the ED, however has normalized to 1.9. As above UA and CXR negative for signs of infection. Tachycardia improved overnight; one episode 105; temp wnl; WBC elevated although improving 13.2.  - Given concern for hypothemia and potential infection was started on broad spectrum abx,aztreonam, vanc on admission - consider discontinuing antibiotics as no clinical source of infection, tachycardia has resolved, and temperature is wnl, and  leukocytosis is improving,  - f/u BCx: NG 1 day - UC: NG 1 day   Hypokalemia; Initially  HyperKalemia- K 7 on presentation with down trend to 6.6. EKG on admission showed peaked T waves, s/p calcium gluconate x1. 11/6 K 3.2 - Repeat EKG in ED showed improvement in T waves - telemetry - will likely replete with Kdur 40mg  once  - AM BMP  Concern for AMS- NOW RESOLVED. GCS in ED 13 initially but was noted to i67mprove. Mental status noted to be waxing and waning in the ED likely secondary to acidosis of DKA. Was seen by CCM for acidosis and mental status derangement who felt he was stable for dispo to step down as his mental status was improving.  AKI- Cr on admission 3.26 but trended to 2.0, baseline appears <1 however his Cr has trended above 1 on multiple previous instances. RESOLVED -0.76 11/6  Psoriasis- Stable  - Continue home clobetasol cream  Avascular necrosis- Stable with outpatient provider aware of this and has referred him to orthopedics to be worked up as an outpatient - continue home tramadol for pain  FEN/GI: carb-mod diet  Prophylaxis: Lovenox ppx  Disposition: Step down, with telemetry, Dr Gwendlyn Deutscher attending  Subjective:  - patient doing okay today; no nausea, vomiting, abdominal pain; - mouth still feels dry; states he does not have an appetite but this usually occurs the first day he is admitted for DKA.  - no fevers, chills  Objective: Temp:  [95 F (35 C)-98.7 F (37.1 C)] 98 F (36.7 C) (11/08 0439) Pulse Rate:  [85-127] 85 (11/08 0439) Resp:  [12-27] 19 (11/08 0439) BP: (99-140)/(69-96) 125/87 mmHg (11/08 0439) SpO2:  [99 %-100 %] 100 % (  11/08 0439) Weight:  [119 lb (53.978 kg)-137 lb 12.6 oz (62.5 kg)] 119 lb (53.978 kg) (11/07 1100) Physical Exam: General: NAD, lying in bed HEENT: PERRL, dry mucous membranes, NCAT, no nuchal rigidity Cardiovascular: Tachycardic, reg rhythm, no murmurs auscultated Respiratory: CTAB, normal WOB Abdomen: soft, non tender, no organomegally Skin: flaky appearing plaques over arms, stomach, back and legs, no  signs of super infection Neuro: no focal deficits, AOx4 Psych: normal mood and affect  Laboratory:  Recent Labs Lab 06/25/15 0626  06/25/15 1058 06/25/15 1533 06/26/15 0326  WBC 25.8*  --   --  21.4* 13.2*  HGB 14.9  < > 15.3 13.0 11.6*  HCT 47.1  < > 45.0 37.8* 33.2*  PLT 457*  --   --  327 276  < > = values in this interval not displayed.  Recent Labs Lab 06/25/15 2130 06/25/15 2320 06/26/15 0326  NA 141 140 140  K 3.5 3.8 3.2*  CL 112* 112* 110  CO2 24 21* 23  BUN 12 11 8   CREATININE 0.98 0.85 0.76  CALCIUM 7.8* 7.8* 8.0*  GLUCOSE 103* 111* 121*   Imaging/Diagnostic Tests: CXR: no acute chest abnormality   Smiley Houseman, MD 06/26/2015, 6:52 AM PGY-1, [place] Intern pager: [phone], text pages welcome

## 2015-06-27 LAB — BASIC METABOLIC PANEL
Anion gap: 7 (ref 5–15)
CHLORIDE: 104 mmol/L (ref 101–111)
CO2: 25 mmol/L (ref 22–32)
CREATININE: 0.5 mg/dL — AB (ref 0.61–1.24)
Calcium: 8.1 mg/dL — ABNORMAL LOW (ref 8.9–10.3)
GFR calc Af Amer: >60 mL/min (ref >60)
GLUCOSE: 122 mg/dL — AB (ref 65–99)
Potassium: 2.9 mmol/L — ABNORMAL LOW (ref 3.5–5.1)
SODIUM: 136 mmol/L (ref 135–145)

## 2015-06-27 LAB — GLUCOSE, CAPILLARY
GLUCOSE-CAPILLARY: 71 mg/dL (ref 65–99)
GLUCOSE-CAPILLARY: 56 mg/dL — AB (ref 65–99)
Glucose-Capillary: 254 mg/dL — ABNORMAL HIGH (ref 65–99)
Glucose-Capillary: 80 mg/dL (ref 65–99)

## 2015-06-27 MED ORDER — GLUCERNA SHAKE PO LIQD
237.0000 mL | Freq: Three times a day (TID) | ORAL | Status: DC
  Administered 2015-06-27: 237 mL via ORAL

## 2015-06-27 MED ORDER — GLUCERNA SHAKE PO LIQD: 237.0000 mL | Freq: Three times a day (TID) | ORAL | Status: DC

## 2015-06-27 MED ORDER — NOVOLOG 100 UNIT/ML ~~LOC~~ SOLN: SUBCUTANEOUS | Status: DC

## 2015-06-27 MED ORDER — INSULIN GLARGINE 100 UNIT/ML ~~LOC~~ SOLN: 20.0000 [IU] | Freq: Every day | SUBCUTANEOUS | Status: DC

## 2015-06-27 MED ORDER — POTASSIUM CHLORIDE CRYS ER 20 MEQ PO TBCR
40.0000 meq | EXTENDED_RELEASE_TABLET | Freq: Two times a day (BID) | ORAL | Status: DC
  Administered 2015-06-27: 40 meq via ORAL
  Filled 2015-06-27: qty 2

## 2015-06-27 MED ORDER — ACCU-CHEK AVIVA DEVI: Status: AC

## 2015-06-27 NOTE — Hospital Discharge Follow-Up (Signed)
Transitional Care Clinic Care Coordination Note:  Admit date:  06/25/15 Discharge date: TBD Discharge Disposition: TBD Patient contact: [phone] (Jaquitta-spouse's cell) Emergency contact(s): [phone] (Asia-daughter)  This Case Manager reviewed patient's EMR and determined patient would benefit from post-discharge medical management and chronic care management services through the [facility]. Patient has a history of type 1 diabetes mellitus. Admitted for DKA. Patient has had 4 inpatient admissions and 2 ED visits in the last 6 months. This Case Manager met with patient to discuss the services and medical management that can be provided at the [facility]. Patient verbalized understanding and agreed to receive post-discharge care at the [facility].   Patient scheduled for Transitional Care appointment on 07/06/15 at 1445.  Clinic information and appointment time provided to patient. Appointment information also placed on AVS.  Assessment:       Home Environment: Patient lives with his spouse in a private residence.       Support System: Spouse       Level of functioning: Independent       Home DME: none.       Home care services: Patient followed by [facility].                  Transportation: Patient has a vehicle and denied difficulty getting to medical appointments.        Food/Nutrition: Patient indicated his Food Stamps recently "got cut down," and he has not been "eating the way he's supposed to." This was discussed with Jasmine Pang, RN CM who indicated she was updated on this, and patient given pantry resources earlier today. This Case Manager also updated Vesta Mixer, SW at [facility] and [facility] so additional food resources could be given to patient at his appointment on 07/06/15.        Medications: Patient able to afford medication copays. He indicated he was out of glucose strips and needed additional  insulin needles. He indicated he had a Reli[employer] meter, and he had to pay out of pocket for strips.  Informed Jasmine Pang, RN CM of patient's need for glucose strips and insulin syringes at discharge. Informed her that Medicaid covers [employer] meter and diabetes supplies. She indicated she would update Hospitalist.        Identified Barriers: lack of food resources, noncompliance        PCP: Advani-who is no longer at [place]. Will need to establish with a PCP at [place] after 30 days of medical management with the [facility].             Arranged services:        Services communicated to [employer], RN CM

## 2015-06-27 NOTE — Progress Notes (Signed)
Admission note:  Arrival Method: Patient transferred from [facility] in w/c with staff accompanying. Mental Orientation:  A & O x 4. Telemetry: NSR, HR 75, 6E-03, CCMD notified. Assessment: See doc flow sheets. Skin: Dry but intact.  Multiple locations of Psoriasis noted all over the body.  Assessed by two nurses Gwenlyn Perking). IV: Right forearm and left forearm, saline locked. Pain: Denies any currently. Tubes: N/A Safety Measures: Bed low position, call bell and phone within reach. Fall Prevention Safety Plan: Reviewed the plan understood and acknowledged. Admission Screening: Complete 6700 Orientation: Patient has been oriented to the unit, staff and to the room.

## 2015-06-27 NOTE — Progress Notes (Signed)
Patient is supposed to get Accu check Glucometer for which patient can have free strips.  NOt sure where he can get it, called family medicine doctor.  According to doctor if he has enough strips at home he can use it and he can get new one at the follow-up appointment.  Patient was not sure if he has enough strips at home.  Informed the doctor, she said she will work on it and will let me know how he can get the glucometer.  Oncoming nurse made aware about the situation.

## 2015-06-27 NOTE — Progress Notes (Signed)
Initial Nutrition Assessment  DOCUMENTATION CODES:   Severe malnutrition in context of acute illness/injury  INTERVENTION:   Glucerna Shake po TID, each supplement provides 220 kcal and 10 grams of protein  NUTRITION DIAGNOSIS:   Increased nutrient needs related to acute illness as evidenced by estimated needs  GOAL:   Patient will meet greater than or equal to 90% of their needs  MONITOR:   PO intake, Supplement acceptance, Labs, Weight trends, I & O's  REASON FOR ASSESSMENT:   Malnutrition Screening Tool  ASSESSMENT:   39 y.o. Male presenting with DKA. PMH is significant for type 1 DM, sporiasis.   Patient reports his appetite is better.  Was decreased PTA for about a day.  PO intake 0-25% per flowsheet records.  Endorses an approximate 15 lb weight loss in the last 2 weeks (severe for time frame).  Typically drinks Glucerna Shakes at home.  RD to order during hospitalization.  Nutrition-Focused physical exam completed. Findings are mild to moderate fat depletion, moderate to severe muscle depletion, and no edema.   Diet Order:  Diet Carb Modified Fluid consistency:: Thin; Room service appropriate?: Yes  Skin:  Reviewed, no issues  Last BM:  11/7  Height:   Ht Readings from Last 1 Encounters:  06/25/15 5\' 10"  (1.778 m)    Weight:   Wt Readings from Last 1 Encounters:  06/25/15 119 lb (53.978 kg)    Wt Readings from Last 20 Encounters:  06/25/15 119 lb (53.978 kg)  05/21/15 137 lb 11.2 oz (62.46 kg)  05/15/15 121 lb 12.8 oz (55.248 kg)  04/30/15 131 lb 9.6 oz (59.693 kg)  04/13/15 135 lb (61.236 kg)  04/03/15 121 lb 0.5 oz (54.9 kg)  03/16/15 134 lb (60.782 kg)  02/22/15 135 lb 12.8 oz (61.598 kg)  02/12/15 135 lb 3.2 oz (61.326 kg)  01/29/15 141 lb (63.957 kg)  01/22/15 144 lb (65.318 kg)  10/25/14 145 lb (65.772 kg)  10/11/14 126 lb 12.2 oz (57.5 kg)  08/04/14 143 lb 12.8 oz (65.227 kg)  06/30/14 150 lb (68.04 kg)  04/18/14 148 lb 3.2 oz (67.223  kg)  01/25/14 141 lb 6.4 oz (64.139 kg)  07/02/13 130 lb (58.968 kg)  07/01/13 131 lb (59.421 kg)  03/16/13 116 lb 4 oz (52.731 kg)  RD re-weighed 11/9 at 136 lbs  Ideal Body Weight:  75.4 kg  BMI:  19.7 kg/m2  Estimated Nutritional Needs:   Kcal:  1800-2000  Protein:  90-100 gm  Fluid:  1.8-2.0 L  EDUCATION NEEDS:   No education needs identified at this time  Arthur Holms, RD, LDN Pager #: [phone] After-Hours Pager #: [phone]

## 2015-06-27 NOTE — Progress Notes (Signed)
Patient Discharge: Disposition: Patient discharged to home. Education: Reviewed medications, discharge instructions, follow-up appointments, and prescriptions. IV: Discontinued Right hand and left hand IV before discharge. Telemetry: Discontinued before discharge, CCMD notified. Transportation: Patient transported in w/c with staff accompanying with him. Belongings: Patient took all his belongings with him.

## 2015-06-27 NOTE — Progress Notes (Signed)
Family Medicine Teaching Service Daily Progress Note Intern Pager: [phone]  Patient name: Jared Murray Medical record number: [id number] Date of birth: [birth year] Age: 39 y.o. Gender: male  Primary Care Provider: Lorayne Marek, MD Consultants: none Code Status: FULL  Pt Overview and Major Events to Date:  11/7: Admitted for DKA   Assessment and Plan: Jared Murray is a 39 y.o. male presenting with DKA. PMH is significant for type 1 DM,sporiasis.   T1DM/DKA- Has poorly controlled T1 DM with last A1c on 05/14/2015 at 14.3. On presentation CBG >600, pH 6.9, AG 39. Unclear what precipitated this episode, as he has no clear infectious etiology with a normal CXR, UA negative for signs of infection. Report that he has not been compliant with diet recommendations. Home Meds: Lantus 30units; Novolog SSI for meals.  CBGs 71-165. AM CBG 56 - in step down will likely transfer to floor today - Q Lantus 20 units 11/8 morning, consider decreasing to 18 units for tonight due to low AM CBG - moderate SSI (only 3 units over 24 hours, however patient is slowly getting appetite back)  - Hypokalemia this morning 2.9 please see below - SW for help with obtaining medications  Concern for SIRS- Resolved; Initial presentation: Tachycardia 123 on admission, tachypnea to 26, WBC 25.8, hypothermia to 95 by rectal temp concerning for SIRS. However, as he appeared dry clinically many of his VS derangements could be described by mild dehydration. However Lactate 3.87 in the ED, however has normalized to 1.9. As above UA and CXR negative for signs of infection. Tachycardia resolved; temp wnl; WBC wnl.  - discontinued antibiotics 11/8: no clinical source of infection, tachycardia has resolved, and temperature is wnl, and  leukocytosis is improving  - f/u BCx: NG 1 day - UC: NG 1 day (final)  Hypokalemia; Initially HyperKalemia- K 7 on presentation with down trend to 6.6. EKG on admission showed peaked T waves,  s/p calcium gluconate x1. 11/6 K 3.2 > 3.2 >11/9  2.9 - EKG in ED showed improvement in T waves - telemetry - s/p Kdur 39mEq 11/9 - Kdur 40 mEq BID x 1 today  Concern for AMS- NOW RESOLVED. GCS in ED 13 initially but was noted to i80mprove. Mental status noted to be waxing and waning in the ED likely secondary to acidosis of DKA. Was seen by CCM for acidosis and mental status derangement who felt he was stable for dispo to step down as his mental status was improving.  AKI- Cr on admission 3.26 but trended to 2.0, baseline appears <1 however his Cr has trended above 1 on multiple previous instances. RESOLVED -11/6: 0.5  Psoriasis- Stable  - Continue home clobetasol cream  Avascular necrosis- Stable with outpatient provider aware of this and has referred him to orthopedics to be worked up as an outpatient - continue home tramadol for pain  FEN/GI: carb-mod diet  Prophylaxis: Lovenox ppx  Disposition: Step down, with telemetry, Dr Gwendlyn Deutscher attending  Subjective:  - patient states he is doing well - no fevers, chills, abdominal pain, shortness of breath, cough  - states feels like his sugar may be low (informed nursing) - states has has not been eating as much but is hungry this morning.  - per chart review 178ml x 2 of feeding supplement and 25% of lunch eaten over 24 hours.   Objective: Temp:  [97.6 F (36.4 C)-98.7 F (37.1 C)] 98.2 F (36.8 C) (11/09 0341) Pulse Rate:  [84-95] 84 (11/09 0341) Resp:  [  10-14] 12 (11/09 0341) BP: (110-125)/(79-88) 112/79 mmHg (11/09 0341) SpO2:  [100 %] 100 % (11/09 0341) Physical Exam: General: NAD, lying in bed HEENT: PERRL, MM somewhat dry ,  Cardiovascular: regular rate, reg rhythm, no murmurs auscultated Respiratory: CTAB, normal WOB Abdomen: soft, non tender, no organomegally Skin: flaky appearing plaques over arms, stomach, back and legs, no signs of super infection Neuro: no focal deficits, AOx4 Psych: normal mood and  affect  Laboratory:  Recent Labs Lab 06/25/15 1533 06/26/15 0326 06/26/15 1842  WBC 21.4* 13.2* 8.7  HGB 13.0 11.6* 12.0*  HCT 37.8* 33.2* 35.1*  PLT 327 276 251    Recent Labs Lab 06/25/15 2320 06/26/15 0326 06/27/15 0354  NA 140 140 136  K 3.8 3.2* 2.9*  CL 112* 110 104  CO2 21* 23 25  BUN 11 8 <5*  CREATININE 0.85 0.76 0.50*  CALCIUM 7.8* 8.0* 8.1*  GLUCOSE 111* 121* 122*   Imaging/Diagnostic Tests: CXR: no acute chest abnormality   Smiley Houseman, MD 06/27/2015, 7:16 AM PGY-1, [place] Intern pager: [phone], text pages welcome

## 2015-06-27 NOTE — Discharge Instructions (Signed)
You were seen in the hospital for DKA.  - Please take Lantus as prescribed; if your morning blood sugar levels are greater than 250 to 300 before you see your doctor next Friday, we advise that you call your primary care doctors office to get recommendations about what you should do about your Lantus dose   Diabetic Ketoacidosis Diabetic ketoacidosis is a life-threatening complication of diabetes. If it is not treated, it can cause severe dehydration and organ damage and can lead to a coma or death. CAUSES This condition develops when there is not enough of the hormone insulin in the body. Insulin helps the body to break down sugar for energy. Without insulin, the body cannot break down sugar, so it breaks down fats instead. This leads to the production of acids that are called ketones. Ketones are poisonous at high levels. This condition can be triggered by:  Stress on the body that is brought on by an illness.  Medicines that raise blood glucose levels.  Not taking diabetes medicine. SYMPTOMS Symptoms of this condition include:  Fatigue.  Weight loss.  Excessive thirst.  Light-headedness.  Fruity or sweet-smelling breath.  Excessive urination.  Vision changes.  Confusion or irritability.  Nausea.  Vomiting.  Rapid breathing.  Abdominal pain.  Feeling flushed. DIAGNOSIS This condition is diagnosed based on a medical history, a physical exam, and blood tests. You may also have a urine test that checks for ketones. TREATMENT This condition may be treated with:  Fluid replacement. This may be done to correct dehydration.  Insulin injections. These may be given through the skin or through an IV tube.  Electrolyte replacement. Electrolytes, such as potassium and sodium, may be given in pill form or through an IV tube.  Antibiotic medicines. These may be prescribed if your condition was caused by an infection. HOME CARE INSTRUCTIONS Eating and Drinking  Drink  enough fluids to keep your urine clear or pale yellow.  If you cannot eat, alternate between drinking fluids with sugar (such as juice) and salty fluids (such as broth or bouillon).  If you can eat, follow your usual diet and drink sugar-free liquids, such as water. Other Instructions  Take insulin as directed by your health care provider. Do not skip insulin injections. Do not use expired insulin.  If your blood sugar is over 240 mg/dL, monitor your urine ketones every 4-6 hours.  If you were prescribed an antibiotic medicine, finish all of it even if you start to feel better.  Rest and exercise only as directed by your health care provider.  If you get sick, call your health care provider and begin treatment quickly. Your body often needs extra insulin to fight an illness.  Check your blood glucose levels regularly. If your blood glucose is high, drink plenty of fluids. This helps to flush out ketones. SEEK MEDICAL CARE IF:  Your blood glucose level is too high or too low.  You have ketones in your urine.  You have a fever.  You cannot eat.  You cannot tolerate fluids.  You have been vomiting for more than 2 hours.  You continue to have symptoms of this condition.  You develop new symptoms. SEEK IMMEDIATE MEDICAL CARE IF:  Your blood glucose levels continue to be high (elevated).  Your monitor reads "high" even when you are taking insulin.  You faint.  You have chest pain.  You have trouble breathing.  You have a sudden, severe headache.  You have sudden weakness in one  arm or one leg.  You have sudden trouble speaking or swallowing.  You have vomiting or diarrhea that gets worse after 3 hours.  You feel severely fatigued.  You have trouble thinking.  You have abdominal pain.  You are severely dehydrated. Symptoms of severe dehydration include:  Extreme thirst.  Dry mouth.  Blue lips.  Cold hands and feet.  Rapid breathing.   This information  is not intended to replace advice given to you by your health care provider. Make sure you discuss any questions you have with your health care provider.   Document Released: 08/01/2000 Document Revised: 12/19/2014 Document Reviewed: 07/12/2014 Elsevier Interactive Patient Education [employer].

## 2015-06-27 NOTE — Care Management Note (Signed)
Case Management Note  Patient Details  Name: Jared Murray MRN: [id number] Date of Birth: 1975/09/30  Subjective/Objective:        Pt lives with family, is followed by [facility].  Has been using [facility] to purchase Lantus and Novolog Insulin @ $3.00 per vial, requests change to [facility] on [address] as it is more convenient.                        Expected Discharge Plan:  Home/Self Care  Discharge planning Services  CM Consult  Status of Service:  Completed, signed off  Zyen Triggs, Kym Groom, [place] 06/27/2015, 10:50 AM

## 2015-06-28 ENCOUNTER — Telehealth: Payer: Self-pay

## 2015-06-28 ENCOUNTER — Telehealth: Payer: Self-pay | Admitting: Internal Medicine

## 2015-06-28 NOTE — Telephone Encounter (Signed)
Transitional Care Clinic Post-discharge Follow-Up Phone Call:  Date of Discharge: 06/27/15 Principal Discharge Diagnosis(es): DKA Post-discharge Communication: Call placed to patient's main contact number #[phone]. Call disconnected x 2.  Additional call placed to number; unable to reach patient. Voicemail left requesting return call.  In addition, placed call to [phone]; unable to reach patient. Additional voicemail left requesting return call. Call Completed: No

## 2015-06-28 NOTE — Discharge Summary (Signed)
[facility] Discharge Summary  Patient name: Jared Murray record number: [id number] Date of birth: 03-02-1976 Age: 39 y.o. Gender: male Date of Admission: 06/25/2015  Date of Discharge: 06/27/15 Admitting Physician: Kinnie Feil, MD  Primary Care Provider: Lorayne Marek, MD Consultants: none  Indication for Hospitalization: DKA  Discharge Diagnoses/Problem List:  DKA  T1DM Sporiasis Hyperkalemia/Hypokalemia AKI Altered Mental Status Avascular Necrosis  Disposition: home  Discharge Condition: improved   Discharge Exam:  General: NAD, lying in bed HEENT: PERRL, MMM Cardiovascular; RRR, no murmurs auscultated Respiratory: CTAB, normal WOB Abdomen: soft, non tender, no organomegally Skin: flaky appearing plaques over arms, stomach, back and legs, no signs of super infection Neuro: no focal deficits, AOx4 Psych: normal mood and affect  Brief Hospital Course:  DKA:  On presentation, blood glucose was >600, pH 6.9, AG 39. Unclear what precipitated this episode as patient had no clear infectious etiology with an unremarkable CXR and UA negative for signs of infection. Patient did report that he had not been compliant with his diet recommendations and did not titrate Lantus insulin according to his CBGs appropriately as instructed by provider. Patient was initially started on an insulin drip and given IVF until anion gap closed x 2. Then he was transitioned to subcutaneous insulin Lantus 20 units.   Concern for SIRS:  Patient initially presented with tachycardia of 123, tachypnea to 26, leukocytosis of 25.8, hypothermia to 65F by rectal temperature concerning for SIRS. However, dehydration was thought to explain vital sign derangements. Lactate was 3.7 in the ED which normalized during hospitalization. As noted above CXR and UA were unremarkable for signs of infection. Patient received Vancomycin and Aztreonam in the ED; antibiotics were not  continued due to dehydration as a likely explanation rather than infection. Tachycardia and leukocytosis improved through course and patient's temperature return to normal limits. Blood cultures and urine cultures were followed which showed no growth.   Hyperkalemia:  Potassium was 7 on presentation. EKG on admission showed peaked T waves. Patient was given calcium gluconate x 1. Repeat EKG in ED showed improvement in T waves. Potassium trended down and had to be repleted due to some hypokalemia.   Concern for Altered Mental Status: GCS in the ED was 13 initially and was noted to be waxing and waning likely secondary to acidosis of DKA. Patient was seen by CCM who felt that he was stable for the step down unit as his mental status was improving. Mental Status improved and patient returned to baseline after treatment for DKA.   AKI: Creatinine on admission was 3.26. This trended down to baseline with hydration.  Psoriasis: Continued home Clobestasol cream   Avascular Necrosis: chronic - continued home Tramadol   Issues for Follow Up:  - glycemic control with current insulin regimen - consider repeating BMP to check electrolytes   Significant Procedures: none  Significant Labs and Imaging:   Recent Labs Lab 06/25/15 1533 06/26/15 0326 06/26/15 1842  WBC 21.4* 13.2* 8.7  HGB 13.0 11.6* 12.0*  HCT 37.8* 33.2* 35.1*  PLT 327 276 251    Recent Labs Lab 06/25/15 1901 06/25/15 2130 06/25/15 2320 06/26/15 0326 06/27/15 0354  NA 142 141 140 140 136  K 3.4* 3.5 3.8 3.2* 2.9*  CL 111 112* 112* 110 104  CO2 24 24 21* 23 25  GLUCOSE 134* 103* 111* 121* 122*  BUN 14 12 11 8  <5*  CREATININE 1.09 0.98 0.85 0.76 0.50*  CALCIUM 8.0* 7.8* 7.8*  8.0* 8.1*     Results/Tests Pending at Time of Discharge: none  Discharge Medications:    Medication List    STOP taking these medications        lisinopril 2.5 MG tablet  Commonly known as:  PRINIVIL,ZESTRIL     traMADol 50 MG  tablet  Commonly known as:  ULTRAM      TAKE these medications        ACCU-CHEK AVIVA device  Use as instructed     clobetasol cream 0.05 %  Commonly known as:  TEMOVATE  Apply 1 application topically 2 (two) times daily.     feeding supplement (GLUCERNA SHAKE) Liqd  Take 237 mLs by mouth 3 (three) times daily between meals.     insulin glargine 100 UNIT/ML injection  Commonly known as:  LANTUS  Inject 0.2 mLs (20 Units total) into the skin at bedtime.     Insulin Syringe-Needle U-100 29G X 1/2" 2 ML Misc  Commonly known as:  B-D INS SYRINGE 2CC/29GX1/2"  Check Blood sugar TID and QHS     NOVOLOG 100 UNIT/ML injection  Generic drug:  insulin aspart  Sliding scale CBG 70 - 120: 0 units CBG 121 - 150: 1 unit,  CBG 151 - 200: 2 units,  CBG 201 - 250: 3 units,  CBG 251 - 300: 5 units,  CBG 301 - 350: 7 units,  CBG 351 - 400: 9 units   CBG > 400: 10 units     pantoprazole 40 MG tablet  Commonly known as:  PROTONIX  Take 1 tablet (40 mg total) by mouth daily.        Discharge Instructions: Please refer to Patient Instructions section of EMR for full details.  Patient was counseled important signs and symptoms that should prompt return to medical care, changes in medications, dietary instructions, activity restrictions, and follow up appointments.   Follow-Up Appointments: Follow-up Information    Follow up with [facility] On 07/06/2015.   Why:  [facility] appointment on 07/06/15 at 2:45 pm with Dr. Jarold Song.   Contact information:   [place] [phone]      Smiley Houseman, MD 06/29/2015, 9:26 PM PGY-1, [place]

## 2015-06-28 NOTE — Telephone Encounter (Signed)
Patient called and requested a prescription for his test strips. Patient would also like his Glucerna and insulin prescribed by his current PCP. Please f/u

## 2015-06-29 ENCOUNTER — Telehealth: Payer: Self-pay

## 2015-06-29 ENCOUNTER — Other Ambulatory Visit: Payer: Self-pay | Admitting: *Deleted

## 2015-06-29 NOTE — Telephone Encounter (Signed)
[facility] Post-discharge Follow-Up Phone Call:  Date of Discharge: 06/27/15 Principal Discharge Diagnosis(es): DKA Post-discharge Communication: Attempt #2 to reach patient.  Patient indicated while hospitalized that his spouse's number was best number to reach him; however, called placed to spouse's listed number spoke with a male who indicated this was the wrong number.  Also placed calls to # [phone]; unable to reach patient. HIPPA compliant voicemail left requesting return call. Also placed call to [phone]; unable to reach patient. HIPPA compliant voicemail left requesting return call. Call Completed: No

## 2015-06-30 LAB — CULTURE, BLOOD (ROUTINE X 2)
CULTURE: NO GROWTH
Culture: NO GROWTH

## 2015-07-02 ENCOUNTER — Other Ambulatory Visit: Payer: Self-pay | Admitting: Family Medicine

## 2015-07-02 ENCOUNTER — Telehealth: Payer: Self-pay

## 2015-07-02 DIAGNOSIS — E108 Type 1 diabetes mellitus with unspecified complications: Secondary | ICD-10-CM

## 2015-07-02 MED ORDER — GLUCOSE BLOOD VI STRP: ORAL_STRIP | Status: DC

## 2015-07-02 NOTE — Telephone Encounter (Signed)
Transitional [facility] Clinic Post-discharge Follow-Up Phone Call:  Third attempt Date of Discharge:  06/27/2015 Principal Discharge Diagnosis(es): DKA Post-discharge Communication:  Calls placed to # [phone], # [phone] and  # [phone] - voice mail messages left on all numbers requesting a call back to # [phone] or [phone].

## 2015-07-04 ENCOUNTER — Telehealth: Payer: Self-pay

## 2015-07-04 NOTE — Telephone Encounter (Signed)
CMA called patient, patient didn't answer, so I left a message for the patient to call me asap.

## 2015-07-05 IMAGING — CR DG CHEST 1V PORT
1 series · 1 of 1 positions shown · non-contrast
Comparison: Chest x-ray 07/02/2013.

CLINICAL DATA: Cough.

EXAM:
PORTABLE CHEST - 1 VIEW

[AP]
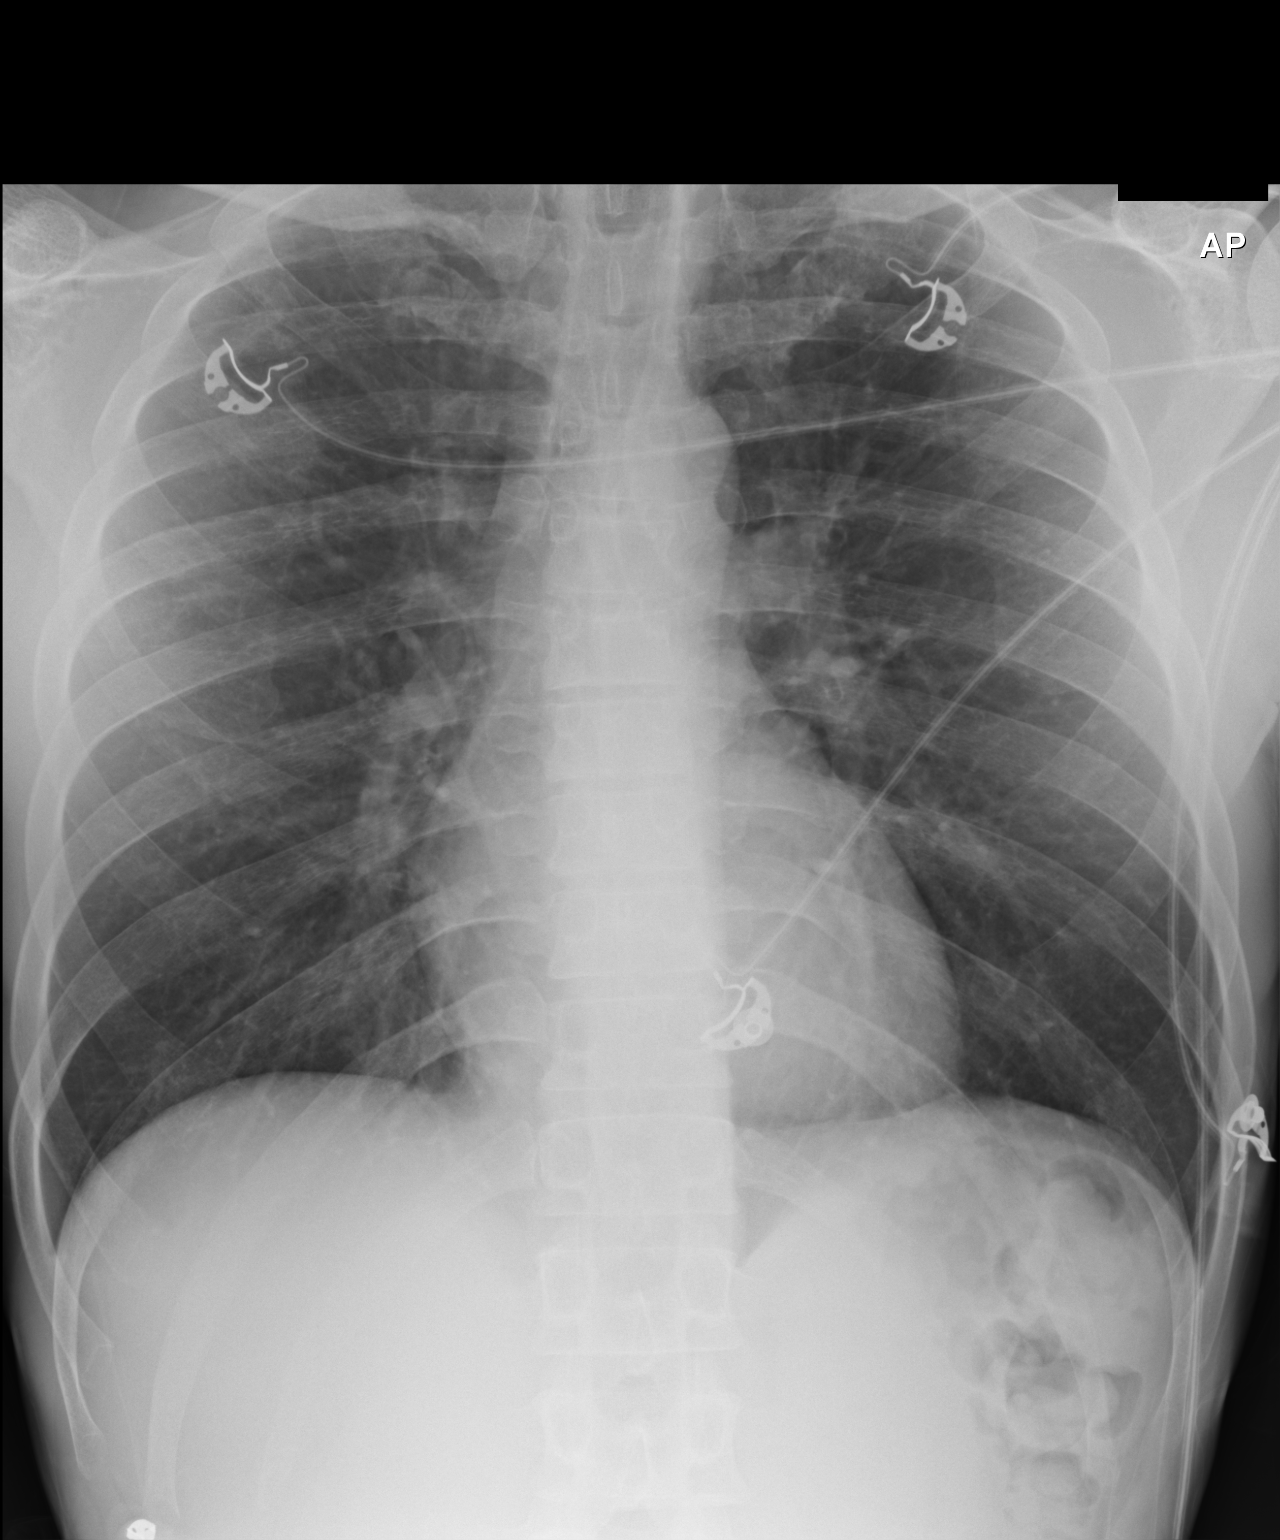

[1 of 1 positions shown; findings below may reference images not displayed]

FINDINGS: Mediastinum and hilar structures are normal. Lungs are clear. Heart
size and pulmonary vascularity normal. No pleural effusion or
pneumothorax.
IMPRESSION: No active disease.

## 2015-07-05 NOTE — Telephone Encounter (Signed)
Transitional [facility] Clinic Post-discharge Follow-Up Phone Call:  Date of Discharge: 06/27/15 Principal Discharge Diagnosis(es): DKA Post-discharge Communication: Attempt #4 to complete post-discharge follow-up phone call.  Also wanted to remind patient of appointment on 07/06/15 at 1445.  Calls placed to [phone] and [phone]. Unable to reach patient at either number. HIPPA compliant voicemails left requesting return phone call. Call Completed: No

## 2015-07-06 ENCOUNTER — Ambulatory Visit: Payer: Medicaid Other | Attending: Family Medicine | Admitting: Family Medicine

## 2015-07-06 ENCOUNTER — Encounter: Payer: Self-pay | Admitting: Family Medicine

## 2015-07-06 VITALS — BP 109/66 | HR 97 | Temp 98.4°F | Resp 18 | Ht 70.0 in | Wt 133.0 lb

## 2015-07-06 DIAGNOSIS — M87059 Idiopathic aseptic necrosis of unspecified femur: Secondary | ICD-10-CM

## 2015-07-06 DIAGNOSIS — L409 Psoriasis, unspecified: Secondary | ICD-10-CM | POA: Diagnosis not present

## 2015-07-06 DIAGNOSIS — E876 Hypokalemia: Secondary | ICD-10-CM

## 2015-07-06 DIAGNOSIS — E109 Type 1 diabetes mellitus without complications: Secondary | ICD-10-CM

## 2015-07-06 LAB — BASIC METABOLIC PANEL
BUN: 7 mg/dL (ref 7–25)
CALCIUM: 9.4 mg/dL (ref 8.6–10.3)
CO2: 26 mmol/L (ref 20–31)
CREATININE: 0.6 mg/dL (ref 0.60–1.35)
Chloride: 100 mmol/L (ref 98–110)
GLUCOSE: 160 mg/dL — AB (ref 65–99)
Potassium: 4.1 mmol/L (ref 3.5–5.3)
Sodium: 137 mmol/L (ref 135–146)

## 2015-07-06 MED ORDER — TRAMADOL HCL 50 MG PO TABS: 100.0000 mg | ORAL_TABLET | Freq: Two times a day (BID) | ORAL | Status: DC | PRN

## 2015-07-06 MED ORDER — GLUCERNA SHAKE PO LIQD: 237.0000 mL | Freq: Three times a day (TID) | ORAL | Status: DC

## 2015-07-06 NOTE — Patient Instructions (Signed)
Diabetes Mellitus and Food It is important for you to manage your blood sugar (glucose) level. Your blood glucose level can be greatly affected by what you eat. Eating healthier foods in the appropriate amounts throughout the day at about the same time each day will help you control your blood glucose level. It can also help slow or prevent worsening of your diabetes mellitus. Healthy eating may even help you improve the level of your blood pressure and reach or maintain a healthy weight.  General recommendations for healthful eating and cooking habits include:  Eating meals and snacks regularly. Avoid going long periods of time without eating to lose weight.  Eating a diet that consists mainly of plant-based foods, such as fruits, vegetables, nuts, legumes, and whole grains.  Using low-heat cooking methods, such as baking, instead of high-heat cooking methods, such as deep frying. Work with your dietitian to make sure you understand how to use the Nutrition Facts information on food labels. HOW CAN FOOD AFFECT ME? Carbohydrates Carbohydrates affect your blood glucose level more than any other type of food. Your dietitian will help you determine how many carbohydrates to eat at each meal and teach you how to count carbohydrates. Counting carbohydrates is important to keep your blood glucose at a healthy level, especially if you are using insulin or taking certain medicines for diabetes mellitus. Alcohol Alcohol can cause sudden decreases in blood glucose (hypoglycemia), especially if you use insulin or take certain medicines for diabetes mellitus. Hypoglycemia can be a life-threatening condition. Symptoms of hypoglycemia (sleepiness, dizziness, and disorientation) are similar to symptoms of having too much alcohol.  If your health care provider has given you approval to drink alcohol, do so in moderation and use the following guidelines:  Women should not have more than one drink per day, and men  should not have more than two drinks per day. One drink is equal to:  12 oz of beer.  5 oz of wine.  1 oz of hard liquor.  Do not drink on an empty stomach.  Keep yourself hydrated. Have water, diet soda, or unsweetened iced tea.  Regular soda, juice, and other mixers might contain a lot of carbohydrates and should be counted. WHAT FOODS ARE NOT RECOMMENDED? As you make food choices, it is important to remember that all foods are not the same. Some foods have fewer nutrients per serving than other foods, even though they might have the same number of calories or carbohydrates. It is difficult to get your body what it needs when you eat foods with fewer nutrients. Examples of foods that you should avoid that are high in calories and carbohydrates but low in nutrients include:  Trans fats (most processed foods list trans fats on the Nutrition Facts label).  Regular soda.  Juice.  Candy.  Sweets, such as cake, pie, doughnuts, and cookies.  Fried foods. WHAT FOODS CAN I EAT? Eat nutrient-rich foods, which will nourish your body and keep you healthy. The food you should eat also will depend on several factors, including:  The calories you need.  The medicines you take.  Your weight.  Your blood glucose level.  Your blood pressure level.  Your cholesterol level. You should eat a variety of foods, including:  Protein.  Lean cuts of meat.  Proteins low in saturated fats, such as fish, egg whites, and beans. Avoid processed meats.  Fruits and vegetables.  Fruits and vegetables that may help control blood glucose levels, such as apples, mangoes, and   yams.  Dairy products.  Choose fat-free or low-fat dairy products, such as milk, yogurt, and cheese.  Grains, bread, pasta, and rice.  Choose whole grain products, such as multigrain bread, whole oats, and brown rice. These foods may help control blood pressure.  Fats.  Foods containing healthful fats, such as nuts,  avocado, olive oil, canola oil, and fish. DOES EVERYONE WITH DIABETES MELLITUS HAVE THE SAME MEAL PLAN? Because every person with diabetes mellitus is different, there is not one meal plan that works for everyone. It is very important that you meet with a dietitian who will help you create a meal plan that is just right for you.   This information is not intended to replace advice given to you by your health care provider. Make sure you discuss any questions you have with your health care provider.   Document Released: 05/01/2005 Document Revised: 08/25/2014 Document Reviewed: 07/01/2013 Elsevier Interactive Patient Education 2016 Elsevier Inc.  

## 2015-07-06 NOTE — Progress Notes (Signed)
[place]   Dates of Telephone Encounters: 05/28/15, 05/29/15, 06/01/15  Admit Date: 06/25/15 Discharge Date: 06/27/15  HPI: Jared Murray is a 39 y.o. male with a history of uncontrolled type  1 DM, Psoriasis, GERD, multiple hospitalizations for DKA and hyperglycemia.  He had presented to the ED with a hyperglycemia of >600, anion gap of 39. Patient initially presented with tachycardia of 123, tachypnea to 26, leukocytosis of 25.8, hypothermia to 23F by rectal temperature concerning for SIRS. However, dehydration was thought to explain vital sign derangements. Lactate was 3.7 in the ED which normalized during hospitalization. CXR and UA were unremarkable for signs of infection. Patient received Vancomycin and Aztreonam in the ED; antibiotics were not continued due to dehydration as a likely explanation rather than infection. Tachycardia and leukocytosis improved through course and patient's temperature returned to normal. Blood cultures and urine cultures were followed which showed no growth.   He received calcium gluconate for hyperkalemia of 7 with EKG changes with resulting hypokalemia which needed repletion.Creatinine on admission was 3.26 and improved to 0.50 on discharge.  Interval History: He admits to non compliance with medication regimens and dietary indiscretion. He complains the tramadol does not help the pain in his hip which is worse when he has to do yard work or some strenuous activity. He is also requesting a refill of Glucerna because he finds himself losing his appetite on many occasions. Patient has No headache, No chest pain, No abdominal pain - No Nausea, No new weakness tingling or numbness, No Cough - SOB.  Allergies  Allergen Reactions  . Penicillins Other (See Comments)    Childhood allergy   Past Medical History  Diagnosis Date  . Eczema   . Type 1 diabetes mellitus ([place])   . Psoriasis   . GERD (gastroesophageal reflux disease)    Current  Outpatient Prescriptions on File Prior to Visit  Medication Sig Dispense Refill  . Blood Glucose Monitoring Suppl (ACCU-CHEK AVIVA) device Use as instructed 1 each 0  . clobetasol cream (TEMOVATE) [id number] % Apply 1 application topically 2 (two) times daily. 30 g 2  . glucose blood test strip Accu-chek aviva. Check blood sugar 4 times daily. Please send future refill requests to PCP Dr. Vernon Prey Advani 100 each 1  . insulin glargine (LANTUS) 100 UNIT/ML injection Inject 0.2 mLs (20 Units total) into the skin at bedtime. 10 mL 11  . Insulin Syringe-Needle U-100 (B-D INS SYRINGE 2CC/29GX1/2") 29G X 1/2" 2 ML MISC Check Blood sugar TID and QHS 100 each 12  . NOVOLOG 100 UNIT/ML injection Sliding scale CBG 70 - 120: 0 units CBG 121 - 150: 1 unit,  CBG 151 - 200: 2 units,  CBG 201 - 250: 3 units,  CBG 251 - 300: 5 units,  CBG 301 - 350: 7 units,  CBG 351 - 400: 9 units   CBG > 400: 10 units 2 vial 12  . pantoprazole (PROTONIX) 40 MG tablet Take 1 tablet (40 mg total) by mouth daily. 30 tablet 2   No current facility-administered medications on file prior to visit.   Family History  Problem Relation Age of Onset  . Hypertension Father   . Diabetes      multiple  . Lupus Cousin   . Stroke Maternal Grandmother   . Stroke Paternal Grandmother    Social History   Social History  . Marital Status: Married    Spouse Name: N/A  . Number of Children: N/A  . Years  of Education: N/A   Occupational History  . Not on file.   Social History Main Topics  . Smoking status: Current Some Day Smoker -- 0.25 packs/day for 10 years    Types: Cigarettes  . Smokeless tobacco: Never Used     Comment: Smoking 2-3 cigs/week  . Alcohol Use: 0.0 oz/week    0 Standard drinks or equivalent per week     Comment: ocassional  . Drug Use: No     Comment: "it's been a while"  . Sexual Activity: Not on file   Other Topics Concern  . 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 HENT: Negative for ear pain, nosebleeds, congestion, facial swelling, rhinorrhea, neck pain, neck stiffness and ear discharge.  Eyes: Negative for pain, discharge, redness, itching and visual disturbance. Respiratory: Negative for cough, choking, chest tightness, shortness of breath, wheezing and stridor.  Cardiovascular: Negative for chest pain, palpitations and leg swelling. Gastrointestinal: Negative for abdominal distention. Genitourinary: Negative for dysuria, urgency, frequency, hematuria, flank pain, decreased urine volume, difficulty urinating and dyspareunia.  Musculoskeletal: Bilateral hip pain. Neurological: Negative for dizziness, tremors, seizures, syncope, facial asymmetry, speech difficulty, weakness, light-headedness, numbness and headaches.  Hematological: Negative for adenopathy. Does not bruise/bleed easily. Psychiatric/Behavioral: Negative for hallucinations, behavioral problems, confusion, dysphoric mood, decreased concentration and agitation.    Objective:   Filed Vitals:   07/06/15 1438  BP: 109/66  Pulse: 97  Temp: 98.4 F (36.9 C)  Resp: 18    Physical Exam: Constitutional: Patient appears well-developed and well-nourished. No distress. HENT: Normocephalic, atraumatic, External right and left ear normal. Oropharynx is clear and moist.  Eyes: Conjunctivae and EOM are normal. PERRLA, no scleral icterus. Neck: Normal ROM. Neck supple. No JVD. No tracheal deviation. No thyromegaly. CVS: RRR, S1/S2 +, no murmurs, no gallops, no carotid bruit.  Pulmonary: Effort and breath sounds normal, no stridor, rhonchi, wheezes, rales.  Abdominal: Soft. BS +,  no distension, tenderness, rebound or guarding.  Musculoskeletal: Normal range of motion. No edema and no tenderness.  Lymphadenopathy: No lymphadenopathy noted, cervical,  inguinal or axillary Neuro: Alert. Normal reflexes, muscle tone coordination. No cranial nerve deficit. Skin: Psoriatic plaques on hands and abdomen  Psychiatric: Normal mood and affect. Behavior, judgment, thought content normal.  Lab Results  Component Value Date   WBC 8.7 06/26/2015   HGB 12.0* 06/26/2015   HCT 35.1* 06/26/2015   MCV 89.8 06/26/2015   PLT 251 06/26/2015   Lab Results  Component Value Date   CREATININE 0.50* 06/27/2015   BUN <5* 06/27/2015   NA 136 06/27/2015   K 2.9* 06/27/2015   CL 104 06/27/2015   CO2 25 06/27/2015    Lab Results  Component Value Date   HGBA1C 14.3* 05/14/2015   Lipid Panel  No results found for: CHOL, TRIG, HDL, CHOLHDL, VLDL, LDLCALC     Assessment and plan:  39 year old male with a history of GERD, Psoriasis, Eczema, uncontrolled type 1 diabetes mellitus (hba1c 12.5) and multiple ED visits for DKA secondary to non compliance.  Type 1 DM: Uncontrolled with A1c of 14.3 largely due to noncompliance and dietary indiscretion. I spent a good amount of time explaining to him the implications of his noncompliance and end organ damage and he promises to do better. Patient takes 16 rather than  20 units of Lantus due to previous Hypoglycemia Not on ACE inhibitor due to low blood pressure. Advised to schedule annual eye exam, up to date on Pneumovax. we'll review blood sugar log at next visit and adjust insulin regimen accordingly  Avascular necrosis of both hips: He is in the process of getting an appointment to see orthopedics to discuss management options. In the meantime he is on tramadol as needed for pain.  Psoriasis: Continue topical steroid. Skin care discussed including avoiding scented products.  GERD: Controlled on PPI  Hypokalemia: Potassium on discharge was 2.9 and I am repeating it today.  This note has been created with Surveyor, quantity. Any transcriptional errors are  unintentional.       [name], MD. [facility] [phone] 07/06/2015, 3:17 PM

## 2015-07-06 NOTE — Progress Notes (Signed)
Pt's here for hospital f/up for DKA. Pt reports that his left side hip is hurting 6/10. When walking and applying pressure makes it hurt. Pt describes pain as tension.  Pt requesting refill on meds.

## 2015-07-07 LAB — MICROALBUMIN / CREATININE URINE RATIO
CREATININE, URINE: 176 mg/dL (ref 20–370)
MICROALB/CREAT RATIO: 14 ug/mg{creat} (ref <30)
Microalb, Ur: 2.5 mg/dL

## 2015-07-16 NOTE — Telephone Encounter (Signed)
CMA checked patient medication list. Patient was seen by another Provider that refilled his test strips, and insulins. Patient's Glucerna was rx'd by our provider, Dr. Jarold Song on 07/06/15.

## 2015-07-17 ENCOUNTER — Telehealth: Payer: Self-pay

## 2015-07-17 NOTE — Telephone Encounter (Signed)
Call placed to the patient to check on his status.  He said that he is doing " fine."  He noted that he has all of his medications and he has been taking them as prescribed.  He said that his blood sugars have been running in the 100's-200's.  He also noted that he is aware of his appointment at [facility] on 07/23/15 @ 1415 and he has no concerns/questions at this time.

## 2015-07-20 ENCOUNTER — Telehealth: Payer: Self-pay

## 2015-07-20 NOTE — Telephone Encounter (Signed)
CMA called patient, patient verified name and DOB. Patient was given lab results with no further questions. Patient verbalized he understood the results.

## 2015-07-20 NOTE — Telephone Encounter (Signed)
This Case Manager placed call to patient to check on status. Patient denied any health concerns. He indicated was checking his blood glucose as directed, and his blood glucose typically ranged from 100-230. He indicated he was being compliant with administering Lantus at bedside and Novolog sliding scale. He also indicated he was being compliant with all other medications. Discussed importance of keeping a log of his blood sugars until his appointment and bringing log to his appointment on 07/23/15. Patient verbalized understanding.  Patient aware of his appointment on 07/23/15 at 1415 and indicated he had a ride to his appointment. No additional concerns identified.

## 2015-07-23 ENCOUNTER — Encounter: Payer: Self-pay | Admitting: Family Medicine

## 2015-07-23 ENCOUNTER — Ambulatory Visit: Payer: Medicaid Other | Attending: Family Medicine | Admitting: Family Medicine

## 2015-07-23 DIAGNOSIS — F17200 Nicotine dependence, unspecified, uncomplicated: Secondary | ICD-10-CM | POA: Diagnosis not present

## 2015-07-23 DIAGNOSIS — M8700 Idiopathic aseptic necrosis of unspecified bone: Secondary | ICD-10-CM

## 2015-07-23 DIAGNOSIS — Z9119 Patient's noncompliance with other medical treatment and regimen: Secondary | ICD-10-CM | POA: Insufficient documentation

## 2015-07-23 DIAGNOSIS — E108 Type 1 diabetes mellitus with unspecified complications: Secondary | ICD-10-CM | POA: Diagnosis not present

## 2015-07-23 DIAGNOSIS — Z794 Long term (current) use of insulin: Secondary | ICD-10-CM | POA: Insufficient documentation

## 2015-07-23 DIAGNOSIS — L309 Dermatitis, unspecified: Secondary | ICD-10-CM | POA: Diagnosis not present

## 2015-07-23 DIAGNOSIS — L409 Psoriasis, unspecified: Secondary | ICD-10-CM | POA: Diagnosis not present

## 2015-07-23 DIAGNOSIS — E1065 Type 1 diabetes mellitus with hyperglycemia: Secondary | ICD-10-CM | POA: Diagnosis not present

## 2015-07-23 DIAGNOSIS — K219 Gastro-esophageal reflux disease without esophagitis: Secondary | ICD-10-CM | POA: Diagnosis not present

## 2015-07-23 DIAGNOSIS — Z880 Allergy status to penicillin: Secondary | ICD-10-CM | POA: Insufficient documentation

## 2015-07-23 LAB — GLUCOSE, POCT (MANUAL RESULT ENTRY): POC Glucose: 323 mg/dl — AB (ref 70–99)

## 2015-07-23 MED ORDER — GLUCOSE BLOOD VI STRP: ORAL_STRIP | Status: DC

## 2015-07-23 NOTE — Progress Notes (Signed)
Patient here for TCC f/up. Patient reports feeling fine today. Pt denies any pain.  Pt requesting refill on glucose strip.

## 2015-07-23 NOTE — Progress Notes (Signed)
CC: TRANSITIONAL CARE CLINIC  HPI: Jared Murray is a 39 y.o. male  is a history of uncontrolled type 1 diabetes mellitus (A1c 14.3), noncompliance, multiple hospital admissions for DKA, avascular necrosis of both hips, psoriasis who is here for follow-up of the transitional care clinic.    He forgot to bring in his blood sugar log today for review but reports that his fasting sugars have been in the 160s however he has been taking anywhere from 16-20 units of Lantus because he has been concerned about hypoglycemia.  Compliance has been a major issue with him and he endorses taking his NovoLog according to the sliding scale. Patient has No headache, No chest pain, No abdominal pain - No Nausea, No new weakness tingling or numbness, No Cough - SOB. he has no other complaints at this time.  Allergies  Allergen Reactions  . Penicillins Other (See Comments)    Childhood allergy   Past Medical History  Diagnosis Date  . Eczema   . Type 1 diabetes mellitus ([place])   . Psoriasis   . GERD (gastroesophageal reflux disease)    Current Outpatient Prescriptions on File Prior to Visit  Medication Sig Dispense Refill  . Blood Glucose Monitoring Suppl (ACCU-CHEK AVIVA) device Use as instructed 1 each 0  . clobetasol cream (TEMOVATE) AB-123456789 % Apply 1 application topically 2 (two) times daily. 30 g 2  . feeding supplement, GLUCERNA SHAKE, (GLUCERNA SHAKE) LIQD Take 237 mLs by mouth 3 (three) times daily between meals. 30 Can 1  . glucose blood test strip Accu-chek aviva. Check blood sugar 4 times daily. Please send future refill requests to PCP Dr. Vernon Prey Advani 100 each 1  . insulin glargine (LANTUS) 100 UNIT/ML injection Inject 0.2 mLs (20 Units total) into the skin at bedtime. 10 mL 11  . Insulin Syringe-Needle U-100 (B-D INS SYRINGE 2CC/29GX1/2") 29G X 1/2" 2 ML MISC Check Blood sugar TID and QHS 100 each 12  . NOVOLOG 100 UNIT/ML injection Sliding scale CBG 70 - 120: 0 units CBG 121 - 150: 1  unit,  CBG 151 - 200: 2 units,  CBG 201 - 250: 3 units,  CBG 251 - 300: 5 units,  CBG 301 - 350: 7 units,  CBG 351 - 400: 9 units   CBG > 400: 10 units 2 vial 12  . pantoprazole (PROTONIX) 40 MG tablet Take 1 tablet (40 mg total) by mouth daily. 30 tablet 2  . traMADol (ULTRAM) 50 MG tablet Take 2 tablets (100 mg total) by mouth every 12 (twelve) hours as needed. 60 tablet 1   No current facility-administered medications on file prior to visit.   Family History  Problem Relation Age of Onset  . Hypertension Father   . Diabetes      multiple  . Lupus Cousin   . Stroke Maternal Grandmother   . Stroke Paternal Grandmother    Social History   Social History  . Marital Status: Married    Spouse Name: N/A  . Number of Children: N/A  . Years of Education: N/A   Occupational History  . Not on file.   Social History Main Topics  . Smoking status: Current Some Day Smoker -- 0.25 packs/day for 10 years    Types: Cigarettes  . Smokeless tobacco: Never Used     Comment: Smoking 2-3 cigs/week  . Alcohol Use: 0.0 oz/week    0 Standard drinks or equivalent per week     Comment: ocassional  . Drug Use:  No     Comment: "it's been a while"  . Sexual Activity: Not on file   Other Topics Concern  . Not on file   Social History Narrative   Lives in [place] - works as a Education officer, museum   Married    Review of Systems: Constitutional: Negative for fever, chills, diaphoresis, activity change, appetite change and fatigue. HENT: Negative for ear pain, nosebleeds, congestion, facial swelling, rhinorrhea, neck pain, neck stiffness and ear discharge.  Eyes: Negative for pain, discharge, redness, itching and visual disturbance. Respiratory: Negative for cough, choking, chest tightness, shortness of breath, wheezing and stridor.  Cardiovascular: Negative for chest pain, palpitations and leg swelling. Gastrointestinal: Negative for abdominal distention. Genitourinary: Negative for dysuria, urgency,  frequency, hematuria, flank pain, decreased urine volume, difficulty urinating and dyspareunia.  Musculoskeletal: Negative for back pain, joint swelling, arthralgias and gait problem. Neurological: Negative for dizziness, tremors, seizures, syncope, facial asymmetry, speech difficulty, weakness, light-headedness, numbness and headaches.  Hematological: Negative for adenopathy. Does not bruise/bleed easily. Psychiatric/Behavioral: Negative for hallucinations, behavioral problems, confusion, dysphoric mood, decreased concentration and agitation.    Objective:   Filed Vitals:   07/23/15 1413  BP: 110/71  Pulse: 96  Temp: 98.4 F (36.9 C)  Resp: 18    Physical Exam: Constitutional: Patient appears well-developed and well-nourished. No distress. HENT: Normocephalic, atraumatic, External right and left ear normal. Oropharynx is clear and moist.  Eyes: Conjunctivae and EOM are normal. PERRLA, no scleral icterus. Neck: Normal ROM. Neck supple. No JVD. No tracheal deviation. No thyromegaly. CVS: RRR, S1/S2 +, no murmurs, no gallops, no carotid bruit.  Pulmonary: Effort and breath sounds normal, no stridor, rhonchi, wheezes, rales.  Abdominal: Soft. BS +,  no distension, tenderness, rebound or guarding.  Musculoskeletal: Normal range of motion. No edema and no tenderness.  Lymphadenopathy: No lymphadenopathy noted, cervical, inguinal or axillary Neuro: Alert. Normal reflexes, muscle tone coordination. No cranial nerve deficit. Skin: Psoriatic rash diffusely distributed in extremities.  Psychiatric: Normal mood and affect. Behavior, judgment, thought content normal.  Lab Results  Component Value Date   WBC 8.7 06/26/2015   HGB 12.0* 06/26/2015   HCT 35.1* 06/26/2015   MCV 89.8 06/26/2015   PLT 251 06/26/2015   Lab Results  Component Value Date   CREATININE 0.60 07/06/2015   BUN 7 07/06/2015   NA 137 07/06/2015   K 4.1 07/06/2015   CL 100 07/06/2015   CO2 26 07/06/2015    Lab  Results  Component Value Date   HGBA1C 14.3* 05/14/2015   Lipid Panel  No results found for: CHOL, TRIG, HDL, CHOLHDL, VLDL, LDLCALC     Assessment and plan:  39 year old male with a history of GERD, Psoriasis, Eczema, uncontrolled type 1 diabetes mellitus (hba1c 14.3) and multiple ED visits for DKA secondary to non compliance.  Type 1 DM: Uncontrolled with A1c of 14.3 largely due to noncompliance and dietary indiscretion. He is not here with his blood sugar log. Current random sugar of 323 is 1 hour postprandial and so I will hold off on giving him insulin in the clinic. Patient takes 16 rather than 20 units of Lantus due to previous Hypoglycemia Not on ACE inhibitor due to low blood pressure. Advised to schedule annual eye exam, up to date on Pneumovax. we'll review blood sugar log at next visit and adjust insulin regimen accordingly  Avascular necrosis of both hips: He is in the process of getting an appointment to see orthopedics to discuss management options. In  the meantime he is on tramadol as needed for pain.  Psoriasis: Continue topical steroid. Skin care discussed including avoiding scented products.  GERD: Controlled on PPI   This note has been created with Surveyor, quantity. Any transcriptional errors are unintentional.           [name], MD. [facility] [phone] 07/23/2015, 2:28 PM

## 2015-08-10 ENCOUNTER — Telehealth: Payer: Self-pay

## 2015-08-10 NOTE — Telephone Encounter (Signed)
This Case Manager placed call to patient to check status and to discuss scheduling a follow-up appointment with Dr. Jarold Song. Patient indicated he was doing well and had no health concerns or questions. Patient indicated he was taking his medications as prescribed and was being compliant with checking blood glucose as prescribed. He indicated he his blood glucose had been ranging from 180-200, and he was also being compliant with sliding scale Novolog. Discussed importance of keeping a log of his blood sugars and bringing log to his next appointment. Patient verbalized understanding. Patient aware that he needs to schedule a follow-up appointment with Dr. Jarold Song so follow-up appointment scheduled for 08/23/15 at 0900.  Patient appreciative of appointment and call.  No additional needs/concerns identified.

## 2015-08-22 ENCOUNTER — Encounter (HOSPITAL_COMMUNITY): Payer: Self-pay | Admitting: Emergency Medicine

## 2015-08-22 ENCOUNTER — Telehealth: Payer: Self-pay

## 2015-08-22 ENCOUNTER — Inpatient Hospital Stay (HOSPITAL_COMMUNITY)
Admission: EM | Admit: 2015-08-22 | Discharge: 2015-08-24 | DRG: 639 | Disposition: A | Discharge status: Home / Self Care | Payer: Medicaid Other | Attending: Internal Medicine | Admitting: Internal Medicine

## 2015-08-22 DIAGNOSIS — F17210 Nicotine dependence, cigarettes, uncomplicated: Secondary | ICD-10-CM | POA: Diagnosis present

## 2015-08-22 DIAGNOSIS — Z794 Long term (current) use of insulin: Secondary | ICD-10-CM | POA: Diagnosis not present

## 2015-08-22 DIAGNOSIS — E1065 Type 1 diabetes mellitus with hyperglycemia: Secondary | ICD-10-CM | POA: Diagnosis not present

## 2015-08-22 DIAGNOSIS — L409 Psoriasis, unspecified: Secondary | ICD-10-CM | POA: Diagnosis present

## 2015-08-22 DIAGNOSIS — E1010 Type 1 diabetes mellitus with ketoacidosis without coma: Secondary | ICD-10-CM

## 2015-08-22 DIAGNOSIS — E875 Hyperkalemia: Secondary | ICD-10-CM | POA: Diagnosis present

## 2015-08-22 DIAGNOSIS — K219 Gastro-esophageal reflux disease without esophagitis: Secondary | ICD-10-CM | POA: Diagnosis present

## 2015-08-22 DIAGNOSIS — R112 Nausea with vomiting, unspecified: Secondary | ICD-10-CM | POA: Diagnosis present

## 2015-08-22 DIAGNOSIS — E0810 Diabetes mellitus due to underlying condition with ketoacidosis without coma: Secondary | ICD-10-CM

## 2015-08-22 DIAGNOSIS — Z79899 Other long term (current) drug therapy: Secondary | ICD-10-CM

## 2015-08-22 DIAGNOSIS — R531 Weakness: Secondary | ICD-10-CM | POA: Diagnosis present

## 2015-08-22 DIAGNOSIS — Z833 Family history of diabetes mellitus: Secondary | ICD-10-CM

## 2015-08-22 DIAGNOSIS — R0602 Shortness of breath: Secondary | ICD-10-CM

## 2015-08-22 LAB — URINALYSIS, ROUTINE W REFLEX MICROSCOPIC
BILIRUBIN URINE: NEGATIVE
HGB URINE DIPSTICK: NEGATIVE
Ketones, ur: >80 mg/dL — AB
Leukocytes, UA: NEGATIVE
Nitrite: NEGATIVE
Protein, ur: NEGATIVE mg/dL
SPECIFIC GRAVITY, URINE: 1.027 (ref 1.005–1.030)
pH: 5 (ref 5.0–8.0)

## 2015-08-22 LAB — I-STAT VENOUS BLOOD GAS, ED
ACID-BASE DEFICIT: 20 mmol/L — AB (ref 0.0–2.0)
BICARBONATE: 7.2 meq/L — AB (ref 20.0–24.0)
O2 Saturation: 84 %
PH VEN: 7.112 — AB (ref 7.250–7.300)
TCO2: 8 mmol/L (ref 0–100)
pCO2, Ven: 22.7 mmHg — ABNORMAL LOW (ref 45.0–50.0)
pO2, Ven: 63 mmHg — ABNORMAL HIGH (ref 30.0–45.0)

## 2015-08-22 LAB — CBC
HCT: 51.6 % (ref 39.0–52.0)
HEMOGLOBIN: 17.9 g/dL — AB (ref 13.0–17.0)
MCH: 32.9 pg (ref 26.0–34.0)
MCHC: 34.7 g/dL (ref 30.0–36.0)
MCV: 94.9 fL (ref 78.0–100.0)
PLATELETS: 302 10*3/uL (ref 150–400)
RBC: 5.44 MIL/uL (ref 4.22–5.81)
RDW: 12.4 % (ref 11.5–15.5)
WBC: 14.1 10*3/uL — ABNORMAL HIGH (ref 4.0–10.5)

## 2015-08-22 LAB — I-STAT CHEM 8, ED
BUN: 21 mg/dL — ABNORMAL HIGH (ref 6–20)
CALCIUM ION: 1.09 mmol/L — AB (ref 1.12–1.23)
CHLORIDE: 106 mmol/L (ref 101–111)
Creatinine, Ser: 0.9 mg/dL (ref 0.61–1.24)
Glucose, Bld: 616 mg/dL (ref 65–99)
HEMATOCRIT: 52 % (ref 39.0–52.0)
HEMOGLOBIN: 17.7 g/dL — AB (ref 13.0–17.0)
Potassium: 6.5 mmol/L (ref 3.5–5.1)
SODIUM: 137 mmol/L (ref 135–145)
TCO2: 9 mmol/L (ref 0–100)

## 2015-08-22 LAB — URINE MICROSCOPIC-ADD ON

## 2015-08-22 LAB — CBG MONITORING, ED: GLUCOSE-CAPILLARY: 506 mg/dL — AB (ref 65–99)

## 2015-08-22 MED ORDER — SODIUM CHLORIDE 0.9 % IV BOLUS (SEPSIS)
1000.0000 mL | Freq: Once | INTRAVENOUS | Status: AC
  Administered 2015-08-22: 1000 mL via INTRAVENOUS

## 2015-08-22 MED ORDER — SODIUM CHLORIDE 0.9 % IV SOLN
INTRAVENOUS | Status: DC
  Administered 2015-08-22: 4.5 [IU]/h via INTRAVENOUS
  Filled 2015-08-22: qty 2.5

## 2015-08-22 NOTE — ED Notes (Addendum)
Pt to ED via [facility] for hyperglycemia.  EMS reports blood sugar was 565.  Pt c/o nausea all day.  Vomiting earlier today. Denies diarrhea. No IV access on arrival.  Pt to ambulatory to bathroom on arrival.

## 2015-08-22 NOTE — ED Notes (Signed)
CBG reading "HIGH". Rn and Dr. Ree Kida.

## 2015-08-22 NOTE — Telephone Encounter (Signed)
This Case Manager placed call to patient to remind him of appointment on 08/23/15 at 0900 with Dr. Jarold Song and to remind patient to bring blood glucose log to his appointment for review. Unable to reach patient; left voicemail requesting return call. Awaiting return call.

## 2015-08-22 NOTE — ED Provider Notes (Signed)
CSN: [id number]     Arrival date & time 08/22/15  1840 History   First MD Initiated Contact with Patient 08/22/15 1907     Chief Complaint  Patient presents with  . Hyperglycemia    Patient is a 40 y.o. male presenting with hyperglycemia. The history is provided by the patient.  Hyperglycemia Severity:  Severe Onset quality:  Sudden Duration:  1 day Timing:  Constant Progression:  Worsening Chronicity:  Recurrent Diabetes status:  Controlled with insulin Relieved by:  Nothing Ineffective treatments:  None tried Associated symptoms: fatigue, nausea, vomiting and weakness   Associated symptoms: no abdominal pain, no chest pain, no fever and no shortness of breath   Risk factors: hx of DKA   Patient reports he has had nausea/vomiting/fatigue starting earlier today He reports his glucose is elevated He reports using his insulin but it "Was too late" He has no pain complaints No CP/abd pain/HA   Past Medical History  Diagnosis Date  . Eczema   . Type 1 diabetes mellitus ([place])   . Psoriasis   . GERD (gastroesophageal reflux disease)    Past Surgical History  Procedure Laterality Date  . Finger surgery     Family History  Problem Relation Age of Onset  . Hypertension Father   . Diabetes      multiple  . Lupus Cousin   . Stroke Maternal Grandmother   . Stroke Paternal Grandmother    Social History  Substance Use Topics  . Smoking status: Current Some Day Smoker -- 0.25 packs/day for 10 years    Types: Cigarettes  . Smokeless tobacco: Never Used     Comment: Smoking 2-3 cigs/week  . Alcohol Use: 0.0 oz/week    0 Standard drinks or equivalent per week     Comment: ocassional    Review of Systems  Constitutional: Positive for fatigue. Negative for fever.  Respiratory: Negative for shortness of breath.   Cardiovascular: Negative for chest pain.  Gastrointestinal: Positive for nausea and vomiting. Negative for abdominal pain.  Neurological: Positive for weakness.   All other systems reviewed and are negative.     Allergies  Penicillins  Home Medications   Prior to Admission medications   Medication Sig Start Date End Date Taking? Authorizing Provider  Blood Glucose Monitoring Suppl (ACCU-CHEK AVIVA) device Use as instructed 06/27/15 06/26/16  Leone Brand, MD  clobetasol cream (TEMOVATE) AB-123456789 % Apply 1 application topically 2 (two) times daily. 04/13/15   [name], MD  feeding supplement, GLUCERNA SHAKE, (GLUCERNA SHAKE) LIQD Take 237 mLs by mouth 3 (three) times daily between meals. 07/06/15   [name], MD  glucose blood test strip Accu-chek aviva. Check blood sugar 3 times daily. 07/23/15   [name], MD  insulin glargine (LANTUS) 100 UNIT/ML injection Inject 0.2 mLs (20 Units total) into the skin at bedtime. 06/27/15   Smiley Houseman, MD  Insulin Syringe-Needle U-100 (B-D INS SYRINGE 2CC/29GX1/2") 29G X 1/2" 2 ML MISC Check Blood sugar TID and QHS 03/22/14   Olugbemiga E Jegede, MD  NOVOLOG 100 UNIT/ML injection Sliding scale CBG 70 - 120: 0 units CBG 121 - 150: 1 unit,  CBG 151 - 200: 2 units,  CBG 201 - 250: 3 units,  CBG 251 - 300: 5 units,  CBG 301 - 350: 7 units,  CBG 351 - 400: 9 units   CBG > 400: 10 units 06/27/15   Smiley Houseman, MD  pantoprazole (PROTONIX) 40 MG tablet Take 1 tablet (  40 mg total) by mouth daily. 01/29/15   [name], MD  traMADol (ULTRAM) 50 MG tablet Take 2 tablets (100 mg total) by mouth every 12 (twelve) hours as needed. 07/06/15   [name], MD   BP 110/73 mmHg  Pulse 99  Temp(Src) 98.5 F (36.9 C) (Oral)  Resp 19  Ht 5\' 10"  (1.778 m)  Wt 63.504 kg  BMI 20.09 kg/m2  SpO2 100% Physical Exam CONSTITUTIONAL: Disheveled, patient smells ketotic HEAD: Normocephalic/atraumatic EYES: EOMI ENMT: Mucous membranes dry NECK: supple no meningeal signs SPINE/BACK:entire spine nontender CV: S1/S2 noted, no murmurs/rubs/gallops noted, tachycardic LUNGS: Lungs are clear to auscultation bilaterally,  no apparent distress ABDOMEN: soft, nontender, no rebound or guarding, bowel sounds noted throughout abdomen GU:no cva tenderness NEURO: Pt is awake/alert/appropriate, moves all extremitiesx4.  No facial droop.  He is watching TV EXTREMITIES: pulses normal/equal, full ROM SKIN: warm, color normal, skin changes of psoriasis noted PSYCH: no abnormalities of mood noted, alert and oriented to situation  ED Course  Procedures  CRITICAL CARE Performed by: Sharyon Cable Total critical care time: 45 minutes Critical care time was exclusive of separately billable procedures and treating other patients. Critical care was necessary to treat or prevent imminent or life-threatening deterioration. Critical care was time spent personally by me on the following activities: development of treatment plan with patient and/or surrogate as well as nursing, discussions with consultants, evaluation of patient's response to treatment, examination of patient, obtaining history from patient or surrogate, ordering and performing treatments and interventions, ordering and review of laboratory studies, ordering and review of radiographic studies, pulse oximetry and re-evaluation of patient's condition. PATIENT IS IN DKA WITH HYPERKALEMIA AND PH OF 7.1 REQUIRING IV FLUIDS AND INSULIN  Labs Review Labs Reviewed  CBC - Abnormal; Notable for the following:    WBC 14.1 (*)    Hemoglobin 17.9 (*)    All other components within normal limits  URINALYSIS, ROUTINE W REFLEX MICROSCOPIC (NOT AT [facility]) - Abnormal; Notable for the following:    Glucose, UA >1000 (*)    Ketones, ur >80 (*)    All other components within normal limits  URINE MICROSCOPIC-ADD ON - Abnormal; Notable for the following:    Squamous Epithelial / LPF 0-5 (*)    Bacteria, UA RARE (*)    All other components within normal limits  CBG MONITORING, ED - Abnormal; Notable for the following:    Glucose-Capillary >600 (*)    All other components within  normal limits  I-STAT CHEM 8, ED - Abnormal; Notable for the following:    Potassium 6.5 (*)    BUN 21 (*)    Glucose, Bld 616 (*)    Calcium, Ion 1.09 (*)    Hemoglobin 17.7 (*)    All other components within normal limits  I-STAT VENOUS BLOOD GAS, ED - Abnormal; Notable for the following:    pH, Ven 7.112 (*)    pCO2, Ven 22.7 (*)    pO2, Ven 63.0 (*)    Bicarbonate 7.2 (*)    Acid-base deficit 20.0 (*)    All other components within normal limits  BLOOD GAS, VENOUS    I have personally reviewed and evaluated these lab results as part of my medical decision-making.   EKG Interpretation   Date/Time:  Wednesday August 22 2015 20:22:26 EST Ventricular Rate:  96 PR Interval:  136 QRS Duration: 131 QT Interval:  380 QTC Calculation: 480 R Axis:   138 Text Interpretation:  Sinus rhythm Biatrial  enlargement Right bundle  branch block similar to prior EKGs Confirmed by [facility]  MD, Michaell Grider  [phone]) on 08/22/2015 8:31:31 PM     Medications  insulin regular (NOVOLIN R,HUMULIN R) 250 Units in sodium chloride 0.9 % 250 mL (1 Units/mL) infusion (not administered)  sodium chloride 0.9 % bolus 1,000 mL (0 mLs Intravenous Stopped 08/22/15 2158)  sodium chloride 0.9 % bolus 1,000 mL (1,000 mLs Intravenous New Bag/Given 08/22/15 2023)  sodium chloride 0.9 % bolus 1,000 mL (1,000 mLs Intravenous New Bag/Given 08/22/15 2200)   9:53 PM Pt in likely DKA Vitals improving with IV fluids Awaiting labs at this time 11:25 PM Pt noted to be in DKA with significant acidosis He is intermittently tachycardic but this improves Clinically he is improved with IV fluids He is also hyperkalemic which should improve with IV insulin D/w dr Hal Hope Will admit to stepdown BP 120/69 mmHg  Pulse 104  Temp(Src) 98.5 F (36.9 C) (Oral)  Resp 20  Ht 5\' 10"  (1.778 m)  Wt 63.504 kg  BMI 20.09 kg/m2  SpO2 100%  MDM   Final diagnoses:  Diabetic ketoacidosis without coma associated with type 1 diabetes  mellitus ([place])    Nursing notes including past medical history and social history reviewed and considered in documentation Labs/vital reviewed myself and considered during evaluation     Ripley Fraise, MD 08/22/15 2328

## 2015-08-22 NOTE — ED Notes (Signed)
IV attempted x2 without success.

## 2015-08-22 NOTE — ED Notes (Signed)
Dr. Wickline at bedside at this time.  

## 2015-08-23 ENCOUNTER — Encounter (HOSPITAL_COMMUNITY): Payer: Self-pay | Admitting: Internal Medicine

## 2015-08-23 ENCOUNTER — Ambulatory Visit: Payer: Medicaid Other | Admitting: Family Medicine

## 2015-08-23 ENCOUNTER — Inpatient Hospital Stay (HOSPITAL_COMMUNITY): Payer: Medicaid Other

## 2015-08-23 DIAGNOSIS — R112 Nausea with vomiting, unspecified: Secondary | ICD-10-CM | POA: Diagnosis present

## 2015-08-23 DIAGNOSIS — E1010 Type 1 diabetes mellitus with ketoacidosis without coma: Principal | ICD-10-CM

## 2015-08-23 DIAGNOSIS — E1065 Type 1 diabetes mellitus with hyperglycemia: Secondary | ICD-10-CM | POA: Diagnosis present

## 2015-08-23 LAB — CBG MONITORING, ED
GLUCOSE-CAPILLARY: 440 mg/dL — AB (ref 65–99)
GLUCOSE-CAPILLARY: 412 mg/dL — AB (ref 65–99)
GLUCOSE-CAPILLARY: 251 mg/dL — AB (ref 65–99)
GLUCOSE-CAPILLARY: 214 mg/dL — AB (ref 65–99)
GLUCOSE-CAPILLARY: 154 mg/dL — AB (ref 65–99)
Glucose-Capillary: 516 mg/dL — ABNORMAL HIGH (ref 65–99)
Glucose-Capillary: 325 mg/dL — ABNORMAL HIGH (ref 65–99)
Glucose-Capillary: 147 mg/dL — ABNORMAL HIGH (ref 65–99)
Glucose-Capillary: 154 mg/dL — ABNORMAL HIGH (ref 65–99)

## 2015-08-23 LAB — BASIC METABOLIC PANEL
ANION GAP: 10 (ref 5–15)
ANION GAP: 10 (ref 5–15)
Anion gap: 15 (ref 5–15)
Anion gap: 11 (ref 5–15)
BUN: 13 mg/dL (ref 6–20)
BUN: 11 mg/dL (ref 6–20)
BUN: 10 mg/dL (ref 6–20)
BUN: 10 mg/dL (ref 6–20)
CHLORIDE: 109 mmol/L (ref 101–111)
CHLORIDE: 107 mmol/L (ref 101–111)
CHLORIDE: 108 mmol/L (ref 101–111)
CHLORIDE: 105 mmol/L (ref 101–111)
CO2: 16 mmol/L — ABNORMAL LOW (ref 22–32)
CO2: 20 mmol/L — AB (ref 22–32)
CO2: 18 mmol/L — AB (ref 22–32)
CO2: 21 mmol/L — AB (ref 22–32)
CREATININE: 1.12 mg/dL (ref 0.61–1.24)
CREATININE: 1.03 mg/dL (ref 0.61–1.24)
CREATININE: 0.96 mg/dL (ref 0.61–1.24)
CREATININE: 0.86 mg/dL (ref 0.61–1.24)
Calcium: 8.5 mg/dL — ABNORMAL LOW (ref 8.9–10.3)
Calcium: 8.1 mg/dL — ABNORMAL LOW (ref 8.9–10.3)
Calcium: 8.2 mg/dL — ABNORMAL LOW (ref 8.9–10.3)
Calcium: 8.2 mg/dL — ABNORMAL LOW (ref 8.9–10.3)
GFR calc non Af Amer: >60 mL/min (ref >60)
GFR calc non Af Amer: >60 mL/min (ref >60)
GFR calc non Af Amer: >60 mL/min (ref >60)
Glucose, Bld: 192 mg/dL — ABNORMAL HIGH (ref 65–99)
Glucose, Bld: 197 mg/dL — ABNORMAL HIGH (ref 65–99)
Glucose, Bld: 219 mg/dL — ABNORMAL HIGH (ref 65–99)
Glucose, Bld: 202 mg/dL — ABNORMAL HIGH (ref 65–99)
POTASSIUM: 4.4 mmol/L (ref 3.5–5.1)
POTASSIUM: 5.2 mmol/L — AB (ref 3.5–5.1)
POTASSIUM: 4.2 mmol/L (ref 3.5–5.1)
POTASSIUM: 4.1 mmol/L (ref 3.5–5.1)
SODIUM: 140 mmol/L (ref 135–145)
SODIUM: 138 mmol/L (ref 135–145)
SODIUM: 136 mmol/L (ref 135–145)
SODIUM: 136 mmol/L (ref 135–145)

## 2015-08-23 LAB — CBC
HEMATOCRIT: 42.9 % (ref 39.0–52.0)
Hemoglobin: 14.8 g/dL (ref 13.0–17.0)
MCH: 32.5 pg (ref 26.0–34.0)
MCHC: 34.5 g/dL (ref 30.0–36.0)
MCV: 94.3 fL (ref 78.0–100.0)
PLATELETS: 243 10*3/uL (ref 150–400)
RBC: 4.55 MIL/uL (ref 4.22–5.81)
RDW: 12.7 % (ref 11.5–15.5)
WBC: 16.2 10*3/uL — AB (ref 4.0–10.5)

## 2015-08-23 LAB — GLUCOSE, CAPILLARY
GLUCOSE-CAPILLARY: 172 mg/dL — AB (ref 65–99)
GLUCOSE-CAPILLARY: 171 mg/dL — AB (ref 65–99)
GLUCOSE-CAPILLARY: 260 mg/dL — AB (ref 65–99)

## 2015-08-23 LAB — I-STAT VENOUS BLOOD GAS, ED
Acid-base deficit: 13 mmol/L — ABNORMAL HIGH (ref 0.0–2.0)
Bicarbonate: 14.5 mEq/L — ABNORMAL LOW (ref 20.0–24.0)
O2 Saturation: 99 %
PCO2 VEN: 38.2 mmHg — AB (ref 45.0–50.0)
PH VEN: 7.188 — AB (ref 7.250–7.300)
PO2 VEN: 167 mmHg — AB (ref 30.0–45.0)
TCO2: 16 mmol/L (ref 0–100)

## 2015-08-23 LAB — TROPONIN I

## 2015-08-23 LAB — CK: Total CK: 68 U/L (ref 49–397)

## 2015-08-23 MED ORDER — ENOXAPARIN SODIUM 40 MG/0.4ML ~~LOC~~ SOLN
40.0000 mg | SUBCUTANEOUS | Status: DC
  Administered 2015-08-23: 40 mg via SUBCUTANEOUS
  Filled 2015-08-23 (×2): qty 0.4

## 2015-08-23 MED ORDER — INSULIN GLARGINE 100 UNIT/ML ~~LOC~~ SOLN: 10.0000 [IU] | Freq: Once | SUBCUTANEOUS | Status: DC

## 2015-08-23 MED ORDER — INSULIN GLARGINE 100 UNIT/ML ~~LOC~~ SOLN
20.0000 [IU] | Freq: Every day | SUBCUTANEOUS | Status: DC
  Filled 2015-08-23: qty 0.2

## 2015-08-23 MED ORDER — SODIUM CHLORIDE 0.9 % IV SOLN
INTRAVENOUS | Status: DC
  Administered 2015-08-23: 7.6 [IU]/h via INTRAVENOUS

## 2015-08-23 MED ORDER — POTASSIUM CHLORIDE 10 MEQ/100ML IV SOLN
10.0000 meq | INTRAVENOUS | Status: DC
  Administered 2015-08-23: 10 meq via INTRAVENOUS
  Filled 2015-08-23: qty 100

## 2015-08-23 MED ORDER — TRAMADOL HCL 50 MG PO TABS: 100.0000 mg | ORAL_TABLET | Freq: Two times a day (BID) | ORAL | Status: DC | PRN

## 2015-08-23 MED ORDER — SODIUM CHLORIDE 0.9 % IV SOLN
INTRAVENOUS | Status: DC
  Administered 2015-08-23 (×2): via INTRAVENOUS

## 2015-08-23 MED ORDER — DEXTROSE-NACL 5-0.45 % IV SOLN
INTRAVENOUS | Status: DC
  Administered 2015-08-23: 08:00:00 via INTRAVENOUS

## 2015-08-23 MED ORDER — INSULIN ASPART 100 UNIT/ML ~~LOC~~ SOLN
0.0000 [IU] | Freq: Every day | SUBCUTANEOUS | Status: DC
  Administered 2015-08-23: 3 [IU] via SUBCUTANEOUS

## 2015-08-23 MED ORDER — INSULIN GLARGINE 100 UNIT/ML ~~LOC~~ SOLN
20.0000 [IU] | Freq: Every day | SUBCUTANEOUS | Status: DC
  Administered 2015-08-23: 20 [IU] via SUBCUTANEOUS
  Filled 2015-08-23 (×2): qty 0.2

## 2015-08-23 MED ORDER — INSULIN ASPART 100 UNIT/ML ~~LOC~~ SOLN
0.0000 [IU] | Freq: Three times a day (TID) | SUBCUTANEOUS | Status: DC
  Administered 2015-08-23 (×2): 3 [IU] via SUBCUTANEOUS
  Administered 2015-08-24: 5 [IU] via SUBCUTANEOUS
  Filled 2015-08-23: qty 1

## 2015-08-23 MED ORDER — CLOBETASOL PROPIONATE 0.05 % EX CREA
1.0000 "application " | TOPICAL_CREAM | Freq: Two times a day (BID) | CUTANEOUS | Status: DC
  Administered 2015-08-23 – 2015-08-24 (×3): 1 via TOPICAL
  Filled 2015-08-23: qty 15

## 2015-08-23 MED ORDER — PANTOPRAZOLE SODIUM 40 MG PO TBEC
40.0000 mg | DELAYED_RELEASE_TABLET | Freq: Every day | ORAL | Status: DC
  Administered 2015-08-24: 40 mg via ORAL
  Filled 2015-08-23: qty 1

## 2015-08-23 MED ORDER — INSULIN GLARGINE 100 UNIT/ML ~~LOC~~ SOLN: 20.0000 [IU] | Freq: Every day | SUBCUTANEOUS | Status: DC

## 2015-08-23 MED ORDER — SODIUM CHLORIDE 0.9 % IV SOLN
INTRAVENOUS | Status: DC
  Administered 2015-08-23: 07:00:00 via INTRAVENOUS

## 2015-08-23 MED ORDER — ENSURE ENLIVE PO LIQD
237.0000 mL | Freq: Once | ORAL | Status: AC
  Administered 2015-08-23: 237 mL via ORAL

## 2015-08-23 NOTE — ED Notes (Signed)
Spoke with Posey Pronto MD advised will change Stepdown bed to Tele bed.

## 2015-08-23 NOTE — ED Notes (Signed)
Pt CBG is 251. Nurse notified

## 2015-08-23 NOTE — ED Notes (Signed)
Pt CBG is 214. Nurse notified.

## 2015-08-23 NOTE — ED Notes (Signed)
Insulin drip decreased to 7.6 ml/hr. CBG 440.

## 2015-08-23 NOTE — H&P (Signed)
[facility] History and Physical  Jared Murray [id number] DOB: 1975-11-16 DOA: 08/22/2015  Referring physician: Dr. Christy Gentles. PCP: Lorayne Marek, MD  Specialists: None.  Chief Complaint: Weakness and nausea vomiting.  HPI: Jared Murray is a 40 y.o. male with history of diabetes mellitus type 1 presence of the ear because of weakness. Patient states since his remodeling he has been having nausea vomiting denies any abdominal pain or diarrhea. Patient states night before last patient had some [place] sandwich. Since yesterday morning patient has been having multiple episodes of nausea vomiting. Patient was brought to the ER by patient's sister since patient was feeling weak. Denies any chest pain or shortness of breath. Patient's blood sugar was found to be elevated and patient states he has been compliant with his medications including long-acting insulin. Patient's anion gap is elevated. Patient has been several IV insulin infusion for DKA and admitted for further management.   Review of Systems: As presented in the history of presenting illness, rest negative.  Past Medical History  Diagnosis Date  . Eczema   . Type 1 diabetes mellitus ([place])   . Psoriasis   . GERD (gastroesophageal reflux disease)    Past Surgical History  Procedure Laterality Date  . Finger surgery     Social History:  reports that he has been smoking Cigarettes.  He has a 2.5 pack-year smoking history. He has never used smokeless tobacco. He reports that he drinks alcohol. He reports that he does not use illicit drugs. Where does patient live home. Can patient participate in ADLs? Yes.  Allergies  Allergen Reactions  . Penicillins Other (See Comments)    Childhood allergy Has patient had a PCN reaction causing immediate rash, facial/tongue/throat swelling, SOB or lightheadedness with hypotension: NO Has patient had a PCN reaction causing severe rash involving mucus membranes or skin  necrosis:NO Has patient had a PCN reaction that required hospitalization NO Has patient had a PCN reaction occurring within the last 10 years: NO If all of the above answers are "NO", then may proceed with Cephalosporin use.    Family History:  Family History  Problem Relation Age of Onset  . Hypertension Father   . Diabetes      multiple  . Lupus Cousin   . Stroke Maternal Grandmother   . Stroke Paternal Grandmother       Prior to Admission medications   Medication Sig Start Date End Date Taking? Authorizing Provider  clobetasol cream (TEMOVATE) AB-123456789 % Apply 1 application topically 2 (two) times daily. 04/13/15  Yes [name], MD  feeding supplement, GLUCERNA SHAKE, (GLUCERNA SHAKE) LIQD Take 237 mLs by mouth 3 (three) times daily between meals. 07/06/15  Yes [name], MD  insulin glargine (LANTUS) 100 UNIT/ML injection Inject 0.2 mLs (20 Units total) into the skin at bedtime. 06/27/15  Yes Smiley Houseman, MD  NOVOLOG 100 UNIT/ML injection Sliding scale CBG 70 - 120: 0 units CBG 121 - 150: 1 unit,  CBG 151 - 200: 2 units,  CBG 201 - 250: 3 units,  CBG 251 - 300: 5 units,  CBG 301 - 350: 7 units,  CBG 351 - 400: 9 units   CBG > 400: 10 units Patient taking differently: Inject 1-10 Units into the skin 3 (three) times daily with meals. Sliding scale CBG 70 - 120: 0 units CBG 121 - 150: 1 unit,  CBG 151 - 200: 2 units,  CBG 201 - 250: 3 units,  CBG 251 - 300:  5 units,  CBG 301 - 350: 7 units,  CBG 351 - 400: 9 units   CBG > 400: 10 units 06/27/15  Yes Smiley Houseman, MD  pantoprazole (PROTONIX) 40 MG tablet Take 1 tablet (40 mg total) by mouth daily. 01/29/15  Yes [name], MD  traMADol (ULTRAM) 50 MG tablet Take 2 tablets (100 mg total) by mouth every 12 (twelve) hours as needed. Patient taking differently: Take 100 mg by mouth every 12 (twelve) hours as needed for moderate pain.  07/06/15  Yes [name], MD  Blood Glucose Monitoring Suppl (ACCU-CHEK AVIVA) device Use as  instructed 06/27/15 06/26/16  Leone Brand, MD  glucose blood test strip Accu-chek aviva. Check blood sugar 3 times daily. 07/23/15   [name], MD  Insulin Syringe-Needle U-100 (B-D INS SYRINGE 2CC/29GX1/2") 29G X 1/2" 2 ML MISC Check Blood sugar TID and QHS 03/22/14   Tresa Garter, MD    Physical Exam: Filed Vitals:   08/23/15 0145 08/23/15 0200 08/23/15 0215 08/23/15 0230  BP: 126/86 125/84 128/83 127/86  Pulse: 104 103 100 103  Temp:      TempSrc:      Resp: 18 19 16 17   Height:      Weight:      SpO2: 100% 100% 100% 100%     General:  Moderately built and nourished.  Eyes: Anicteric no pallor.  ENT: No discharge from the ears eyes nose and mouth.  Neck: No mass felt. No neck rigidity.  Cardiovascular: S1-S2 heard.  Respiratory: No rhonchi or crepitations.  Abdomen: Soft nontender bowel sounds present. No guarding or rigidity.  Skin: No rash.  Musculoskeletal: No edema.  Psychiatric: Appears normal.  Neurologic: Alert awake oriented to time place and person. Moves all extremities.  Labs on Admission:  Basic Metabolic Panel:  Recent Labs Lab 08/22/15 2254  NA 137  K 6.5*  CL 106  GLUCOSE 616*  BUN 21*  CREATININE 0.90   Liver Function Tests: No results for input(s): AST, ALT, ALKPHOS, BILITOT, PROT, ALBUMIN in the last 168 hours. No results for input(s): LIPASE, AMYLASE in the last 168 hours. No results for input(s): AMMONIA in the last 168 hours. CBC:  Recent Labs Lab 08/22/15 1925 08/22/15 2254  WBC 14.1*  --   HGB 17.9* 17.7*  HCT 51.6 52.0  MCV 94.9  --   PLT 302  --    Cardiac Enzymes: No results for input(s): CKTOTAL, CKMB, CKMBINDEX, TROPONINI in the last 168 hours.  BNP (last 3 results) No results for input(s): BNP in the last 8760 hours.  ProBNP (last 3 results) No results for input(s): PROBNP in the last 8760 hours.  CBG:  Recent Labs Lab 08/22/15 1912 08/22/15 2332 08/23/15 0031 08/23/15 0131 08/23/15 0238   GLUCAP >600* 506* 516* 440* 412*    Radiological Exams on Admission: No results found.   Assessment/Plan Principal Problem:   DKA (diabetic ketoacidoses) ([facility]) Active Problems:   Psoriasis   Nausea & vomiting   1. Diabetic ketoacidosis - precipitating cause not clear. Could be noncompliance but patient states he has been compliant with his medications. Check hemoglobin A1c. Continue IV insulin infusion with aggressive hydration until anion gap is corrected. We'll check CK levels and troponin. 2. Nausea vomiting - abdomen appears benign. Check LFTs. Denies abdominal pain. Could be from gastroparesis. 3. Hyperkalemia - probably secondary to DKA anything which will improve with IV insulin infusion. Positive for metabolic panel. 4. History of Psoriasis - on  clobetasol cream.   DVT Prophylaxis Lovenox.  Code Status: Full code.  Family Communication: Discussed with patient.  Disposition Plan: Admit to inpatient.    Sibyl Mikula N. [facility] Pager [phone].  If 7PM-7AM, please contact night-coverage [email or web address] Password [email or web address] 08/23/2015, 4:18 AM

## 2015-08-23 NOTE — Hospital Discharge Follow-Up (Signed)
[facility] at [place]:  Patient known to the [facility] at [place]. Had appointment scheduled for 08/23/15 at 0900. Attempted to meet with patient at bedside to determine if patient agreeable to continued follow-up and medical management with the [facility], and if so, to reschedule appointment. However, patient sleeping so unable to speak with him. Will attempt to follow-up at a later time.  Will continue to follow patient's medical progress.

## 2015-08-23 NOTE — ED Notes (Signed)
Insulin drip increased from 4.5 to 9.1 ml/hr. CBG 516.

## 2015-08-23 NOTE — Progress Notes (Signed)
Utilization review completed. Joleigh Mineau, RN, BSN. 

## 2015-08-23 NOTE — Plan of Care (Signed)
[facility] PLAN OF CARE NOTE  Patient: Jared Murray   [id number]  PCP: Lorayne Marek, MD   DOB: [birth year]  DOA: 08/22/2015   DOS: 08/23/2015   Patient was admitted by my colleague Dr. Hal Hope earlier on 08/23/2015. I have reviewed the H&P as well as assessment and plan and agree with the same, other than Important changes which are listed below.  Plan of care: Discontinue IV insulin, continue IV normal saline Transition to subcutaneous insulin based on sliding scale with home insulin Lantus 20 units daily at bedtime Advance diet carb modified Transferred to telemetry.  Author: Berle Mull, MD [facility] Pager: [phone] 08/23/2015 2:38 PM   If 7PM-7AM, please contact night-coverage at [email or web address], password [facility]

## 2015-08-23 NOTE — ED Notes (Signed)
Patient cbg was 147, Nurse was informed.

## 2015-08-23 NOTE — ED Notes (Signed)
Patient cbg was 154, the Nurse was informed.

## 2015-08-23 NOTE — Progress Notes (Signed)
Arrival Method: via stretcher Mental Status: alert and oriented x 4 Telemetry: applied and CCMD notified, verified by RN and NT Skin: pt refused assessment at this time. Agrees to allow RN to assess him alter Tubes: n/a IV: LAC, NS @ 125 Pain: Denies Family: None present Living Situation: home  Safety Measures: bed in lowest position, call bell in reach, non skid socks on 6E Orientation: oriented to staff and unit

## 2015-08-23 NOTE — ED Notes (Signed)
MD called requested Level of Care to be changed to Tele due to Insulin drip D/C. States once BMP results he will reassess.

## 2015-08-24 DIAGNOSIS — R112 Nausea with vomiting, unspecified: Secondary | ICD-10-CM

## 2015-08-24 LAB — BASIC METABOLIC PANEL
ANION GAP: 7 (ref 5–15)
BUN: 6 mg/dL (ref 6–20)
CALCIUM: 8.1 mg/dL — AB (ref 8.9–10.3)
CHLORIDE: 104 mmol/L (ref 101–111)
CO2: 22 mmol/L (ref 22–32)
Creatinine, Ser: 0.64 mg/dL (ref 0.61–1.24)
GFR calc non Af Amer: >60 mL/min (ref >60)
GLUCOSE: 194 mg/dL — AB (ref 65–99)
Potassium: 3.4 mmol/L — ABNORMAL LOW (ref 3.5–5.1)
Sodium: 133 mmol/L — ABNORMAL LOW (ref 135–145)

## 2015-08-24 LAB — CBC
HEMATOCRIT: 36 % — AB (ref 39.0–52.0)
HEMOGLOBIN: 12.8 g/dL — AB (ref 13.0–17.0)
MCH: 32.4 pg (ref 26.0–34.0)
MCHC: 35.6 g/dL (ref 30.0–36.0)
MCV: 91.1 fL (ref 78.0–100.0)
Platelets: 235 10*3/uL (ref 150–400)
RBC: 3.95 MIL/uL — ABNORMAL LOW (ref 4.22–5.81)
RDW: 12.5 % (ref 11.5–15.5)
WBC: 6.3 10*3/uL (ref 4.0–10.5)

## 2015-08-24 LAB — HEMOGLOBIN A1C
HEMOGLOBIN A1C: 12.4 % — AB (ref 4.8–5.6)
MEAN PLASMA GLUCOSE: 309 mg/dL

## 2015-08-24 LAB — GLUCOSE, CAPILLARY
GLUCOSE-CAPILLARY: 238 mg/dL — AB (ref 65–99)
Glucose-Capillary: 81 mg/dL (ref 65–99)

## 2015-08-24 NOTE — Hospital Discharge Follow-Up (Signed)
Trans[facility] Clinic at [place]:   Patient known to the [facility] at [place]. Met with patient at bedside to determine if patient agreeable to follow-up and medical management with the [facility]itional [facility] Clinic once again. Patient agreeable to Transitional Care Clinic follow-up after discharge. Appointment scheduled for 08/30/15 at 0930 with Dr. Jarold Song. AVS updated. Discussed importance of compliance with medications, including insulin and discussed importance of checking blood glucose as directed. Encouraged patient to keep a log of blood glucose results and to bring record to his appointment on 08/30/15. Patient verbalized understanding. Patient indicated he has glucometer and diabetes supplies at home.  Patient indicated he remains active with Case Management services with [facility] RN CM updated.

## 2015-08-25 NOTE — Discharge Summary (Signed)
[facility] Discharge Summary   Patient: Jared Murray [id number]   PCP: Lorayne Marek, MD DOB: 04/19/1976   Date of admission: 08/22/2015   Date of discharge: 08/24/2015    Discharge Diagnoses:  Principal Problem:   Type 1 diabetes mellitus with hyperglycemia (HCC) Active Problems:   Psoriasis   Nausea & vomiting   Recommendations for Outpatient Follow-up:  1. Please remain compliant with diabetic regimen.)  Diet recommendation: Carb modified diet  Activity: The patient is advised to gradually reintroduce usual activities.  Discharge Condition: good  History of present illness: As per the H and P dictated on admission, "Jared Murray is a 40 y.o. male with history of diabetes mellitus type 1 presence of the ear because of weakness. Patient states since his remodeling he has been having nausea vomiting denies any abdominal pain or diarrhea. Patient states night before last patient had some [place] sandwich. Since yesterday morning patient has been having multiple episodes of nausea vomiting. Patient was brought to the ER by patient's sister since patient was feeling weak. Denies any chest pain or shortness of breath. Patient's blood sugar was found to be elevated and patient states he has been compliant with his medications including long-acting insulin. Patient's anion gap is elevated. Patient has been several IV insulin infusion for DKA and admitted for further management. "  Hospital Course:  Summary of his active problems in the hospital is as following. Principal Problem:   Type 1 diabetes mellitus with hyperglycemia ([place]) Patient presented with blood glucose level more than 600, did not have any anion gap on admission,. Patient was started on IV insulin with hydration. His sugar responded rapidly and he was transitioned to home insulin. Patient mentions that sometimes while he is out working he does not use insulin on a regular basis. Patient was recommended to  remain compliant with his insulin regimen to avoid any long-term complication. Patient verbalized understanding.   All other chronic medical condition were stable during the hospitalization.  Patient was ambulatory without any assistance. On the day of the discharge the patient's glucoses were under control, and no other acute medical condition were reported by patient. the patient was felt safe to be discharge at home with family support.  Procedures and Results:  None   Consultations:  None  Discharge Exam: Filed Weights   08/22/15 1849 08/23/15 2039  Weight: 63.504 kg (140 lb) 59.6 kg (131 lb 6.3 oz)   Filed Vitals:   08/24/15 0438 08/24/15 0852  BP: 100/62 108/78  Pulse: 79 73  Temp: 98.4 F (36.9 C) 98.1 F (36.7 C)  Resp: 18 18   General: Appear in no distress, no Rash; Oral Mucosa moist. Cardiovascular: S1 and S2 Present, no Murmur, no JVD Respiratory: Bilateral Air entry present and Clear to Auscultation, no Crackles, no wheezes Abdomen: Bowel Sound present, Soft and no tenderness Extremities: no Pedal edema, no calf tenderness Neurology: Grossly no focal neuro deficit.  DISCHARGE MEDICATION: Discharge Instructions    Diet Carb Modified    Complete by:  As directed      Discharge instructions    Complete by:  As directed   It is important that you read following instructions as well as go over your medication list with RN to help you understand your care after this hospitalization.  Discharge Instructions: Remain compliant with insulin regimen to improve your A1C.   Please request your primary care physician to go over all Hospital Tests and Procedure/Radiological results at the follow  up,  Please get all Hospital records sent to your PCP by signing hospital release before you go home.   You were cared for by a hospitalist during your hospital stay. If you have any questions about your discharge medications or the care you received while you were in the  hospital after you are discharged, you can call the unit and ask to speak with the hospitalist on call if the hospitalist that took care of you is not available.  Once you are discharged, your primary care physician will handle any further medical issues. Please note that NO REFILLS for any discharge medications will be authorized once you are discharged, as it is imperative that you return to your primary care physician (or establish a relationship with a primary care physician if you do not have one) for your aftercare needs so that they can reassess your need for medications and monitor your lab values. You Must read complete instructions/literature along with all the possible adverse reactions/side effects for all the Medicines you take and that have been prescribed to you. Take any new Medicines after you have completely understood and accept all the possible adverse reactions/side effects. Wear Seat belts while driving. If you have smoked or chewed Tobacco in the last 2 yrs please stop smoking and/or stop any Recreational drug use.     Increase activity slowly    Complete by:  As directed           Discharge Medication List as of 08/24/2015 10:02 AM    CONTINUE these medications which have NOT CHANGED   Details  clobetasol cream (TEMOVATE) [id number] % Apply 1 application topically 2 (two) times daily., Starting 04/13/2015, Until Discontinued, Normal    feeding supplement, GLUCERNA SHAKE, (GLUCERNA SHAKE) LIQD Take 237 mLs by mouth 3 (three) times daily between meals., Starting 07/06/2015, Until Discontinued, Print    insulin glargine (LANTUS) 100 UNIT/ML injection Inject 0.2 mLs (20 Units total) into the skin at bedtime., Starting 06/27/2015, Until Discontinued, Normal    NOVOLOG 100 UNIT/ML injection Sliding scale CBG 70 - 120: 0 units CBG 121 - 150: 1 unit,  CBG 151 - 200: 2 units,  CBG 201 - 250: 3 units,  CBG 251 - 300: 5 units,  CBG 301 - 350: 7 units,  CBG 351 - 400: 9 units   CBG > 400: 10  units, Print    pantoprazole (PROTONIX) 40 MG tablet Take 1 tablet (40 mg total) by mouth daily., Starting 01/29/2015, Until Discontinued, Normal    traMADol (ULTRAM) 50 MG tablet Take 2 tablets (100 mg total) by mouth every 12 (twelve) hours as needed., Starting 07/06/2015, Until Discontinued, Print    Blood Glucose Monitoring Suppl (ACCU-CHEK AVIVA) device Use as instructed, Normal    glucose blood test strip Accu-chek aviva. Check blood sugar 3 times daily., Normal    Insulin Syringe-Needle U-100 (B-D INS SYRINGE 2CC/29GX1/2") 29G X 1/2" 2 ML MISC Check Blood sugar TID and QHS, Normal       Allergies  Allergen Reactions  . Penicillins Other (See Comments)    Childhood allergy Has patient had a PCN reaction causing immediate rash, facial/tongue/throat swelling, SOB or lightheadedness with hypotension: NO Has patient had a PCN reaction causing severe rash involving mucus membranes or skin necrosis:NO Has patient had a PCN reaction that required hospitalization NO Has patient had a PCN reaction occurring within the last 10 years: NO If all of the above answers are "NO", then may proceed with Cephalosporin use.  Follow-up Information    Follow up with [facility] On 08/30/2015.   Why:  Transitional Care Clinic appointment on 08/30/15 at 9:30 am with Dr. Jarold Song.   Contact information:   [address] [place] [phone]      The results of significant diagnostics from this hospitalization (including imaging, microbiology, ancillary and laboratory) are listed below for reference.    Significant Diagnostic Studies: Dg Chest Port 1 View  08/23/2015  CLINICAL DATA:  Shortness of breath.  Hyperglycemia EXAM: PORTABLE CHEST 1 VIEW COMPARISON:  June 25, 2015 FINDINGS: No edema or consolidation. The heart size and pulmonary vascularity are normal. No adenopathy. There is evidence of old trauma with remodeling in the lateral  left clavicle. IMPRESSION: No edema or consolidation. Electronically Signed   By: Lowella Grip III M.D.   On: 08/23/2015 12:11    Microbiology: No results found for this or any previous visit (from the past 240 hour(s)).   Labs: CBC:  Recent Labs Lab 08/22/15 1925 08/22/15 2254 08/23/15 0745 08/24/15 0359  WBC 14.1*  --  16.2* 6.3  HGB 17.9* 17.7* 14.8 12.8*  HCT 51.6 52.0 42.9 36.0*  MCV 94.9  --  94.3 91.1  PLT 302  --  243 [id number]   Basic Metabolic Panel:  Recent Labs Lab 08/23/15 0745 08/23/15 1015 08/23/15 1342 08/23/15 1431 08/24/15 0359  NA 140 138 136 136 133*  K 4.4 5.2* 4.2 4.1 3.4*  CL 109 107 108 105 104  CO2 16* 20* 18* 21* 22  GLUCOSE 192* 197* 219* 202* 194*  BUN 13 11 10 10 6   CREATININE 1.12 1.03 0.96 0.86 0.64  CALCIUM 8.5* 8.1* 8.2* 8.2* 8.1*   Cardiac Enzymes:  Recent Labs Lab 08/23/15 0745  CKTOTAL 68  TROPONINI <0.03   CBG:  Recent Labs Lab 08/23/15 1237 08/23/15 1614 08/23/15 2150 08/24/15 0754 08/24/15 1137  GLUCAP 172* 171* 260* 81 238*   Time spent: 30 minutes  Signed:  Milan Perkins  [facility] 08/24/2015, 5:41 PM

## 2015-08-28 ENCOUNTER — Telehealth: Payer: Self-pay | Admitting: *Deleted

## 2015-08-28 ENCOUNTER — Telehealth: Payer: Self-pay

## 2015-08-28 NOTE — Telephone Encounter (Signed)
Message received from Heath Lark, [facility] Liaison, returning this CM call.  Call then placed to K. Madilyn Fireman and informed her of the phone # [phone] that the patient was reached on earlier today.

## 2015-08-28 NOTE — Telephone Encounter (Signed)
Transitional Care Clinic Post-discharge Follow-Up Phone Call:  Date of Discharge: Principal Discharge Diagnosis(es): Post-discharge Communication: (Clearly document all attempts clearly and date contact made)  Call Completed: Y  N                    With Whom: (Patient, caregiver) Interpreter Needed: Y  N               Language/Dialect:     Please check all that apply:   Patient is knowledgeable of his/her condition(s) and/or treatment.  Patient is caring for self at home.    Medication Reconciliation:   Medication list reviewed with patient.  Patient obtained all discharge medications. If not, why?   Activities of Daily Living:   Independent    Community resources in place for patient:   None            Patient Education: Encouraged patient to continue monitoring his CBG's and take all medications as prescribed. He was reminded of his appointment on Thursday at 0930        Questions/Concerns discussed: none

## 2015-08-28 NOTE — Telephone Encounter (Signed)
Call returned to Heath Lark, [facility] Liaison [phone] regarding contact information for the patient.  Voice mail message left requesting a call back to # [phone] or [phone].

## 2015-08-30 ENCOUNTER — Encounter: Payer: Self-pay | Admitting: Family Medicine

## 2015-08-30 ENCOUNTER — Ambulatory Visit: Payer: Medicaid Other | Attending: Family Medicine | Admitting: Family Medicine

## 2015-08-30 VITALS — BP 111/74 | HR 90 | Temp 98.2°F | Resp 13 | Ht 70.0 in | Wt 132.6 lb

## 2015-08-30 DIAGNOSIS — E1010 Type 1 diabetes mellitus with ketoacidosis without coma: Secondary | ICD-10-CM | POA: Diagnosis not present

## 2015-08-30 DIAGNOSIS — L309 Dermatitis, unspecified: Secondary | ICD-10-CM | POA: Insufficient documentation

## 2015-08-30 DIAGNOSIS — Z9889 Other specified postprocedural states: Secondary | ICD-10-CM | POA: Diagnosis not present

## 2015-08-30 DIAGNOSIS — Z794 Long term (current) use of insulin: Secondary | ICD-10-CM | POA: Diagnosis not present

## 2015-08-30 DIAGNOSIS — Z880 Allergy status to penicillin: Secondary | ICD-10-CM | POA: Insufficient documentation

## 2015-08-30 DIAGNOSIS — Z9114 Patient's other noncompliance with medication regimen: Secondary | ICD-10-CM | POA: Diagnosis not present

## 2015-08-30 DIAGNOSIS — K219 Gastro-esophageal reflux disease without esophagitis: Secondary | ICD-10-CM | POA: Insufficient documentation

## 2015-08-30 DIAGNOSIS — M8700 Idiopathic aseptic necrosis of unspecified bone: Secondary | ICD-10-CM | POA: Diagnosis not present

## 2015-08-30 DIAGNOSIS — F17210 Nicotine dependence, cigarettes, uncomplicated: Secondary | ICD-10-CM | POA: Insufficient documentation

## 2015-08-30 DIAGNOSIS — R112 Nausea with vomiting, unspecified: Secondary | ICD-10-CM | POA: Insufficient documentation

## 2015-08-30 DIAGNOSIS — E1065 Type 1 diabetes mellitus with hyperglycemia: Secondary | ICD-10-CM | POA: Diagnosis not present

## 2015-08-30 DIAGNOSIS — L409 Psoriasis, unspecified: Secondary | ICD-10-CM | POA: Insufficient documentation

## 2015-08-30 LAB — GLUCOSE, POCT (MANUAL RESULT ENTRY): POC GLUCOSE: 177 mg/dL — AB (ref 70–99)

## 2015-08-30 NOTE — Patient Instructions (Signed)
Diabetes Mellitus and Food It is important for you to manage your blood sugar (glucose) level. Your blood glucose level can be greatly affected by what you eat. Eating healthier foods in the appropriate amounts throughout the day at about the same time each day will help you control your blood glucose level. It can also help slow or prevent worsening of your diabetes mellitus. Healthy eating may even help you improve the level of your blood pressure and reach or maintain a healthy weight.  General recommendations for healthful eating and cooking habits include:  Eating meals and snacks regularly. Avoid going long periods of time without eating to lose weight.  Eating a diet that consists mainly of plant-based foods, such as fruits, vegetables, nuts, legumes, and whole grains.  Using low-heat cooking methods, such as baking, instead of high-heat cooking methods, such as deep frying. Work with your dietitian to make sure you understand how to use the Nutrition Facts information on food labels. HOW CAN FOOD AFFECT ME? Carbohydrates Carbohydrates affect your blood glucose level more than any other type of food. Your dietitian will help you determine how many carbohydrates to eat at each meal and teach you how to count carbohydrates. Counting carbohydrates is important to keep your blood glucose at a healthy level, especially if you are using insulin or taking certain medicines for diabetes mellitus. Alcohol Alcohol can cause sudden decreases in blood glucose (hypoglycemia), especially if you use insulin or take certain medicines for diabetes mellitus. Hypoglycemia can be a life-threatening condition. Symptoms of hypoglycemia (sleepiness, dizziness, and disorientation) are similar to symptoms of having too much alcohol.  If your health care provider has given you approval to drink alcohol, do so in moderation and use the following guidelines:  Women should not have more than one drink per day, and men  should not have more than two drinks per day. One drink is equal to:  12 oz of beer.  5 oz of wine.  1 oz of hard liquor.  Do not drink on an empty stomach.  Keep yourself hydrated. Have water, diet soda, or unsweetened iced tea.  Regular soda, juice, and other mixers might contain a lot of carbohydrates and should be counted. WHAT FOODS ARE NOT RECOMMENDED? As you make food choices, it is important to remember that all foods are not the same. Some foods have fewer nutrients per serving than other foods, even though they might have the same number of calories or carbohydrates. It is difficult to get your body what it needs when you eat foods with fewer nutrients. Examples of foods that you should avoid that are high in calories and carbohydrates but low in nutrients include:  Trans fats (most processed foods list trans fats on the Nutrition Facts label).  Regular soda.  Juice.  Candy.  Sweets, such as cake, pie, doughnuts, and cookies.  Fried foods. WHAT FOODS CAN I EAT? Eat nutrient-rich foods, which will nourish your body and keep you healthy. The food you should eat also will depend on several factors, including:  The calories you need.  The medicines you take.  Your weight.  Your blood glucose level.  Your blood pressure level.  Your cholesterol level. You should eat a variety of foods, including:  Protein.  Lean cuts of meat.  Proteins low in saturated fats, such as fish, egg whites, and beans. Avoid processed meats.  Fruits and vegetables.  Fruits and vegetables that may help control blood glucose levels, such as apples, mangoes, and   yams.  Dairy products.  Choose fat-free or low-fat dairy products, such as milk, yogurt, and cheese.  Grains, bread, pasta, and rice.  Choose whole grain products, such as multigrain bread, whole oats, and brown rice. These foods may help control blood pressure.  Fats.  Foods containing healthful fats, such as nuts,  avocado, olive oil, canola oil, and fish. DOES EVERYONE WITH DIABETES MELLITUS HAVE THE SAME MEAL PLAN? Because every person with diabetes mellitus is different, there is not one meal plan that works for everyone. It is very important that you meet with a dietitian who will help you create a meal plan that is just right for you.   This information is not intended to replace advice given to you by your health care provider. Make sure you discuss any questions you have with your health care provider.   Document Released: 05/01/2005 Document Revised: 08/25/2014 Document Reviewed: 07/01/2013 Elsevier Interactive Patient Education 2016 Elsevier Inc.  

## 2015-08-30 NOTE — Progress Notes (Signed)
Patient here to follow up after recent admission for DKA He states ED MD told him the reason for his DKA was likely a stomach virus He states he takes his lantus based on his sugars and if they are low he only takes 16 units instead of the prescribed 20.  He says Dr. Jarold Song told him to do this He also reports not being able to stay asleep at night and wakes up and has trouble falling back asleep He does not need refills

## 2015-08-31 NOTE — Progress Notes (Signed)
[place]  Date of Telephone encounter: 08/28/15  Admit date: 08/22/15 Discharge date: 08/24/15   Subjective:    Patient ID: Jared Murray, male    DOB: 1975/12/13, 40 y.o.   MRN: [id number]  HPI  Jared Murray is a 36 with a history of uncontrolled type 1 diabetes mellitus (A1c 14.3), noncompliance, multiple hospital admissions for DKA, avascular necrosis of both hips, psoriasis who is here for follow-up of the transitional care clinic.    He was recently hospitalized for hyperglycemia thought to be secondary to gastroenteritis. He had presented to the ED with nausea, vomiting and generalized weakness and was found to have an elevated blood sugar of greater than 600 with normal anion gap and so was commenced on IV insulin and IV fluids with resulting improvement in his blood sugar and resolution of GI symptoms. He endorses some follow dietary indiscretion as he ingested lots of high carb foods like past which she states his wife loves cooking a lot bleeding to elevated blood sugars. He has a sheet of paper with him on which he recorded his fasting sugars which are 80, 105, 229, 309 and random sugars in the range of 163 -384; he has been taking anywhere from 16-20 units of Lantus because he has been concerned about hypoglycemia.  Compliance has been a major issue with him and he endorses taking his NovoLog according to the sliding scale.  Patient has No headache, No chest pain, No abdominal pain - No Nausea, No new weakness tingling or numbness, No Cough - SOB. he has no other complaints at this time.  Past Medical History  Diagnosis Date  . Eczema   . Type 1 diabetes mellitus ([place])   . Psoriasis   . GERD (gastroesophageal reflux disease)     Past Surgical History  Procedure Laterality Date  . Finger surgery      Social History   Social History  . Marital Status: Married    Spouse Name: N/A  . Number of Children: N/A  . Years of Education: N/A    Occupational History  . Not on file.   Social History Main Topics  . Smoking status: Current Some Day Smoker -- 0.25 packs/day for 10 years    Types: Cigarettes  . Smokeless tobacco: Never Used     Comment: Smoking 2-3 cigs/week  . Alcohol Use: 0.0 oz/week    0 Standard drinks or equivalent per week     Comment: ocassional  . Drug Use: No     Comment: "it's been a while"  . Sexual Activity: Not on file   Other Topics Concern  . Not on file   Social History Narrative   Lives in [place] - works as a Education officer, museum   Married    Allergies  Allergen Reactions  . Penicillins Other (See Comments)    Childhood allergy Has patient had a PCN reaction causing immediate rash, facial/tongue/throat swelling, SOB or lightheadedness with hypotension: NO Has patient had a PCN reaction causing severe rash involving mucus membranes or skin necrosis:NO Has patient had a PCN reaction that required hospitalization NO Has patient had a PCN reaction occurring within the last 10 years: NO If all of the above answers are "NO", then may proceed with Cephalosporin use.      Review of Systems Constitutional: Negative for fever, chills, diaphoresis, activity change, appetite change and fatigue. HENT: Negative for ear pain, nosebleeds, congestion, facial swelling, rhinorrhea, neck pain, neck stiffness and ear discharge.  Eyes:  Negative for pain, discharge, redness, itching and visual disturbance. Respiratory: Negative for cough, choking, chest tightness, shortness of breath, wheezing and stridor.  Cardiovascular: Negative for chest pain, palpitations and leg swelling. Gastrointestinal: Negative for abdominal distention. Genitourinary: Negative for dysuria, urgency, frequency, hematuria, flank pain, decreased urine volume, difficulty urinating and dyspareunia.  Musculoskeletal: Negative for back pain, joint swelling, arthralgias and gait problem. Neurological: Negative for dizziness, tremors,  seizures, syncope, facial asymmetry, speech difficulty, weakness, light-headedness, numbness and headaches.  Hematological: Negative for adenopathy. Does not bruise/bleed easily. Psychiatric/Behavioral: Negative for hallucinations, behavioral problems, confusion, dysphoric mood, decreased concentration and agitation    Objective: Filed Vitals:   08/30/15 0935  BP: 111/74  Pulse: 90  Temp: 98.2 F (36.8 C)  Resp: 13  Height: '5\' 10"'  (1.778 m)  Weight: 132 lb 9.6 oz (60.147 kg)  SpO2: 98%      Physical Exam Constitutional: Patient appears well-developed and well-nourished. No distress. HENT: Normocephalic, atraumatic, External right and left ear normal. Oropharynx is clear and moist.  Eyes: Conjunctivae and EOM are normal. PERRLA, no scleral icterus. Neck: Normal ROM. Neck supple. No JVD. No tracheal deviation. No thyromegaly. CVS: RRR, S1/S2 +, no murmurs, no gallops, no carotid bruit.  Pulmonary: Effort and breath sounds normal, no stridor, rhonchi, wheezes, rales.  Abdominal: Soft. BS +,  no distension, tenderness, rebound or guarding.  Musculoskeletal: Normal range of motion. No edema and no tenderness.  Lymphadenopathy: No lymphadenopathy noted, cervical, inguinal or axillary Neuro: Alert. Normal reflexes, muscle tone coordination. No cranial nerve deficit. Skin: Psoriatic rash diffusely distributed in extremities.  Psychiatric: Normal mood and affect. Behavior, judgment, thought content normal.       Assessment & Plan:  40 year old male with a history of GERD, Psoriasis, Eczema, uncontrolled type 1 diabetes mellitus (hba1c 14.3) and multiple ED visits for DKA secondary to non compliance.  Type 1 DM: Uncontrolled with A1c of 12.4 largely due to noncompliance and dietary indiscretion. He is scheduled to be seen by the nutritionist from [facility] and will be attending the sessions with his wife. He is not here with his blood sugar log. Patient takes 16 rather than 20 units of  Lantus due to previous Hypoglycemia; I have informed him to increase this to 18 units and also to cover his snacks with insulin with a ratio of 15 g of carbs to 1 unit of insulin in addition to his mealtime coverage. Not on ACE inhibitor due to low blood pressure. Advised to schedule annual eye exam, up to date on Pneumovax. we'll review blood sugar log at next visit and adjust insulin regimen accordingly He will need to be met at his next office visit as his last labs revealed hypokalemia  Avascular necrosis of both hips: He is in the process of getting an appointment to see orthopedics to discuss management options. In the meantime he is on tramadol as needed for pain.  Psoriasis: Continue topical steroid. Skin care discussed including avoiding scented products.  GERD: Controlled on PPI  Nausea and vomiting: Resolved   This note has been created with Surveyor, quantity. Any transcriptional errors are unintentional.

## 2015-09-07 ENCOUNTER — Other Ambulatory Visit: Payer: Self-pay

## 2015-09-07 ENCOUNTER — Telehealth: Payer: Self-pay | Admitting: Internal Medicine

## 2015-09-07 DIAGNOSIS — E108 Type 1 diabetes mellitus with unspecified complications: Secondary | ICD-10-CM

## 2015-09-07 MED ORDER — GLUCOSE BLOOD VI STRP: ORAL_STRIP | Status: DC

## 2015-09-07 NOTE — Telephone Encounter (Signed)
Patient called requesting a refill on glucose test strips.  Patient use [facility] on [employer]. Please follow up.

## 2015-09-10 MED ORDER — GLUCOSE BLOOD VI STRP: ORAL_STRIP | Status: DC

## 2015-09-10 NOTE — Telephone Encounter (Signed)
Refill placed. Patient called and aware

## 2015-09-12 ENCOUNTER — Telehealth: Payer: Self-pay

## 2015-09-12 NOTE — Telephone Encounter (Signed)
Attempted to contact the patient to check on his status and to remind him of his appointment at [facility] tomorrow, 09/13/15 @ 0900.  Call placed to # [phone] (H) and a HIPAA compliant voice mail message was left requesting a call back to # [phone] or [phone]. Call then placed to #  # [phone] (M) and the patient's son answered and stated that it was his ( son's phone) and he said that he would tell his father that I called.

## 2015-09-13 ENCOUNTER — Ambulatory Visit: Payer: Medicaid Other | Attending: Family Medicine | Admitting: Family Medicine

## 2015-09-13 ENCOUNTER — Encounter: Payer: Self-pay | Admitting: Family Medicine

## 2015-09-13 VITALS — BP 125/84 | HR 90 | Temp 98.3°F | Resp 15 | Ht 70.0 in | Wt 131.6 lb

## 2015-09-13 DIAGNOSIS — F17200 Nicotine dependence, unspecified, uncomplicated: Secondary | ICD-10-CM

## 2015-09-13 DIAGNOSIS — Z79899 Other long term (current) drug therapy: Secondary | ICD-10-CM | POA: Diagnosis not present

## 2015-09-13 DIAGNOSIS — E108 Type 1 diabetes mellitus with unspecified complications: Secondary | ICD-10-CM

## 2015-09-13 DIAGNOSIS — Z794 Long term (current) use of insulin: Secondary | ICD-10-CM | POA: Insufficient documentation

## 2015-09-13 DIAGNOSIS — Z880 Allergy status to penicillin: Secondary | ICD-10-CM | POA: Diagnosis not present

## 2015-09-13 DIAGNOSIS — L409 Psoriasis, unspecified: Secondary | ICD-10-CM | POA: Diagnosis not present

## 2015-09-13 DIAGNOSIS — E1065 Type 1 diabetes mellitus with hyperglycemia: Secondary | ICD-10-CM | POA: Diagnosis present

## 2015-09-13 DIAGNOSIS — K219 Gastro-esophageal reflux disease without esophagitis: Secondary | ICD-10-CM | POA: Insufficient documentation

## 2015-09-13 DIAGNOSIS — L309 Dermatitis, unspecified: Secondary | ICD-10-CM | POA: Insufficient documentation

## 2015-09-13 DIAGNOSIS — F17210 Nicotine dependence, cigarettes, uncomplicated: Secondary | ICD-10-CM | POA: Insufficient documentation

## 2015-09-13 DIAGNOSIS — E876 Hypokalemia: Secondary | ICD-10-CM | POA: Insufficient documentation

## 2015-09-13 DIAGNOSIS — Z9889 Other specified postprocedural states: Secondary | ICD-10-CM | POA: Insufficient documentation

## 2015-09-13 DIAGNOSIS — Z9114 Patient's other noncompliance with medication regimen: Secondary | ICD-10-CM | POA: Diagnosis not present

## 2015-09-13 DIAGNOSIS — M87059 Idiopathic aseptic necrosis of unspecified femur: Secondary | ICD-10-CM

## 2015-09-13 DIAGNOSIS — E1029 Type 1 diabetes mellitus with other diabetic kidney complication: Secondary | ICD-10-CM | POA: Diagnosis not present

## 2015-09-13 HISTORY — DX: Nicotine dependence, unspecified, uncomplicated: F17.200

## 2015-09-13 LAB — GLUCOSE, POCT (MANUAL RESULT ENTRY): POC Glucose: 296 mg/dl — AB (ref 70–99)

## 2015-09-13 MED ORDER — NICOTINE 14 MG/24HR TD PT24: 14.0000 mg | MEDICATED_PATCH | Freq: Every day | TRANSDERMAL | Status: DC

## 2015-09-13 MED ORDER — GLUCOSE BLOOD VI STRP: ORAL_STRIP | Status: DC

## 2015-09-13 MED ORDER — INSULIN GLARGINE 100 UNIT/ML ~~LOC~~ SOLN: 18.0000 [IU] | Freq: Every day | SUBCUTANEOUS | Status: DC

## 2015-09-13 MED ORDER — NOVOLOG 100 UNIT/ML ~~LOC~~ SOLN: SUBCUTANEOUS | Status: DC

## 2015-09-13 NOTE — Progress Notes (Signed)
[place]  Date of Telephone encounter: 08/28/15  Admit date: 08/22/15 Discharge date: 08/24/15  Subjective:    Patient ID: Jared Murray, male    DOB: 1976/07/22, 40 y.o.   MRN: [id number]  HPI Jared Murray is a 40 with a history of uncontrolled type 1 diabetes mellitus (A1c 14.3), noncompliance, multiple hospital admissions for DKA, avascular necrosis of both hips, psoriasis who is here for follow-up of the transitional care clinic.  He is currently on Lantus 18 units at bedtime and NovoLog sliding scale and his blood sugar log reveals fasting sugars in the 60-125, 200-300 before lunch and 200-270 before dinner. He usually does not have to take NovoLog prior to his breakfast but usually needs between 5 and 7 units prior to dinner and prior to lunch. He is working on dietary changes to cut back on his carbs; he is yet to see the nutritionist from[facility].  Requested prescription for nicotine patches as he is trying to quit smoking.  Past Medical History  Diagnosis Date  . Eczema   . Type 1 diabetes mellitus ([place])   . Psoriasis   . GERD (gastroesophageal reflux disease)     Past Surgical History  Procedure Laterality Date  . Finger surgery      Social History   Social History  . Marital Status: Married    Spouse Name: N/A  . Number of Children: N/A  . Years of Education: N/A   Occupational History  . Not on file.   Social History Main Topics  . Smoking status: Current Some Day Smoker -- 0.25 packs/day for 10 years    Types: Cigarettes  . Smokeless tobacco: Never Used     Comment: Smoking 2-3 cigs/week  . Alcohol Use: 0.0 oz/week    0 Standard drinks or equivalent per week     Comment: ocassional  . Drug Use: No     Comment: "it's been a while"  . Sexual Activity: Not on file   Other Topics Concern  . Not on file   Social History Narrative   Lives in [place] - works as a Education officer, museum   Married    Allergies  Allergen Reactions  .  Penicillins Other (See Comments)    Childhood allergy Has patient had a PCN reaction causing immediate rash, facial/tongue/throat swelling, SOB or lightheadedness with hypotension: NO Has patient had a PCN reaction causing severe rash involving mucus membranes or skin necrosis:NO Has patient had a PCN reaction that required hospitalization NO Has patient had a PCN reaction occurring within the last 10 years: NO If all of the above answers are "NO", then may proceed with Cephalosporin use.    Current Outpatient Prescriptions on File Prior to Visit  Medication Sig Dispense Refill  . Blood Glucose Monitoring Suppl (ACCU-CHEK AVIVA) device Use as instructed 1 each 0  . clobetasol cream (TEMOVATE) 4.31 % Apply 1 application topically 2 (two) times daily. 30 g 2  . feeding supplement, GLUCERNA SHAKE, (GLUCERNA SHAKE) LIQD Take 237 mLs by mouth 3 (three) times daily between meals. 30 Can 1  . Insulin Syringe-Needle U-100 (B-D INS SYRINGE 2CC/29GX1/2") 29G X 1/2" 2 ML MISC Check Blood sugar TID and QHS 100 each 12  . pantoprazole (PROTONIX) 40 MG tablet Take 1 tablet (40 mg total) by mouth daily. 30 tablet 2  . traMADol (ULTRAM) 50 MG tablet Take 2 tablets (100 mg total) by mouth every 12 (twelve) hours as needed. (Patient taking differently: Take 100 mg by mouth  every 12 (twelve) hours as needed for moderate pain. ) 60 tablet 1  . triamcinolone (KENALOG) 0.025 % cream Apply 1 application topically 2 (two) times daily.     No current facility-administered medications on file prior to visit.     Review of Systems  Constitutional: Negative for activity change and appetite change.  HENT: Negative for sinus pressure and sore throat.   Eyes: Negative for visual disturbance.  Respiratory: Negative for cough, chest tightness and shortness of breath.   Cardiovascular: Negative for chest pain and leg swelling.  Gastrointestinal: Negative for abdominal pain, diarrhea, constipation and abdominal distention.   Endocrine: Negative.   Genitourinary: Negative for dysuria.  Musculoskeletal: Negative for myalgias and joint swelling.  Skin: Negative for rash.  Allergic/Immunologic: Negative.   Neurological: Negative for weakness, light-headedness and numbness.  Psychiatric/Behavioral: Negative for suicidal ideas and dysphoric mood.       Objective: Filed Vitals:   09/13/15 0936  BP: 125/84  Pulse: 90  Temp: 98.3 F (36.8 C)  Resp: 15  Height: '5\' 10"'  (1.778 m)  Weight: 131 lb 9.6 oz (59.693 kg)  SpO2: 97%      Physical Exam Constitutional: Patient appears well-developed and well-nourished. No distress. HENT: Normocephalic, atraumatic, External right and left ear normal. Oropharynx is clear and moist.  Eyes: Conjunctivae and EOM are normal. PERRLA, no scleral icterus. Neck: Normal ROM. Neck supple. No JVD. No tracheal deviation. No thyromegaly. CVS: RRR, S1/S2 +, no murmurs, no gallops, no carotid bruit.  Pulmonary: Effort and breath sounds normal, no stridor, rhonchi, wheezes, rales.  Abdominal: Soft. BS +,  no distension, tenderness, rebound or guarding.  Musculoskeletal: Normal range of motion. No edema and no tenderness.  Lymphadenopathy: No lymphadenopathy noted, cervical, inguinal or axillary Neuro: Alert. Normal reflexes, muscle tone coordination. No cranial nerve deficit. Skin: Psoriatic rash diffusely distributed in extremities.  Psychiatric: Normal mood and affect. Behavior, judgment, thought content normal.       Assessment & Plan:  40 year old male with a history of GERD, Psoriasis, Eczema, uncontrolled type 1 diabetes mellitus (hba1c 14.3) and multiple ED visits for DKA secondary to non compliance.  Type 1 DM: Uncontrolled with A1c of 12.4 largely due to noncompliance and dietary indiscretion. I have switched him from Lovenox sliding scale to a fixed regimen: 5 units before breakfast, 7 units before lunch and 7 units before dinner He is scheduled to be seen by the  nutritionist from [facility] and will be attending the sessions with his wife. Not on ACE inhibitor due to low blood pressure. Advised to schedule annual eye exam, up to date on Pneumovax. He will need to be met at his next office visit as his last labs revealed hypokalemia  Avascular necrosis of both hips: He is in the process of getting an appointment to see orthopedics to discuss management options. In the meantime he is on tramadol as needed for pain.  Psoriasis: Continue topical steroid. Skin care discussed including avoiding scented products.  GERD: Controlled on PPI    This note has been created with Surveyor, quantity. Any transcriptional errors are unintentional.

## 2015-09-13 NOTE — Patient Instructions (Signed)
Tobacco Use Disorder Tobacco use disorder (TUD) is a mental disorder. It is the long-term use of tobacco in spite of related health problems or difficulty with normal life activities. Tobacco is most commonly smoked as cigarettes and less commonly as cigars or pipes. Smokeless chewing tobacco and snuff are also popular. People with TUD get a feeling of extreme pleasure (euphoria) from using tobacco and have a desire to use it again and again. Repeated use of tobacco can cause problems. The addictive effects of tobacco are due mainly tothe ingredient nicotine. Nicotine also causes a rush of adrenaline (epinephrine) in the body. This leads to increased blood pressure, heart rate, and breathing rate. These changes may cause problems for people with high blood pressure, weak hearts, or lung disease. High doses of nicotine in children and pets can lead to seizures and death.  Tobacco contains a number of other unsafe chemicals. These chemicals are especially harmful when inhaled as smoke and can damage almost every organ in the body. Smokers live shorter lives than nonsmokers and are at risk of dying from a number of diseases and cancers. Tobacco smoke can also cause health problems for nonsmokers (due to inhaling secondhand smoke). Smoking is also a fire hazard.  TUD usually starts in the late teenage years and is most common in young adults between the ages of 18 and 25 years. People who start smoking earlier in life are more likely to continue smoking as adults. TUD is somewhat more common in men than women. People with TUD are at higher risk for using alcohol and other drugs of abuse. RISK FACTORS Risk factors for TUD include:   Having family members with the disorder.  Being around people who use tobacco.  Having an existing mental health issue such as schizophrenia, depression, bipolar disorder, ADHD, or posttraumatic stress disorder (PTSD). SIGNS AND SYMPTOMS  People with tobacco use disorder have  two or more of the following signs and symptoms within 12 months:   Use of more tobacco over a longer period than intended.   Not able to cut down or control tobacco use.   A lot of time spent obtaining or using tobacco.   Strong desire or urge to use tobacco (craving). Cravings may last for 6 months or longer after quitting.  Use of tobacco even when use leads to major problems at work, school, or home.   Use of tobacco even when use leads to relationship problems.   Giving up or cutting down on important life activities because of tobacco use.   Repeatedly using tobacco in situations where it puts you or others in physical danger, like smoking in bed.   Use of tobacco even when it is known that a physical or mental problem is likely related to tobacco use.   Physical problems are numerous and may include chronic bronchitis, emphysema, lung and other cancers, gum disease, high blood pressure, heart disease, and stroke.   Mental problems caused by tobacco may include difficulty sleeping and anxiety.  Need to use greater amounts of tobacco to get the same effect. This means you have developed a tolerance.   Withdrawal symptoms as a result of stopping or rapidly cutting back use. These symptoms may last a month or more after quitting and include the following:   Depressed, anxious, or irritable mood.   Difficulty concentrating.   Increased appetite.  Restlessness or trouble sleeping.   Use of tobacco to avoid withdrawal symptoms. DIAGNOSIS  Tobacco use disorder is diagnosed by   your health care provider. A diagnosis may be made by:  Your health care provider asking questions about your tobacco use and any problems it may be causing.  A physical exam.  Lab tests.  You may be referred to a mental health professional or addiction specialist. The severity of tobacco use disorder depends on the number of signs and symptoms you have:   Mild--Two or three  symptoms.  Moderate--Four or five symptoms.   Severe--Six or more symptoms.  TREATMENT  Many people with tobacco use disorder are unable to quit on their own and need help. Treatment options include the following:  Nicotine replacement therapy (NRT). NRT provides nicotine without the other harmful chemicals in tobacco. NRT gradually lowers the dosage of nicotine in the body and reduces withdrawal symptoms. NRT is available in over-the-counter forms (gum, lozenges, and skin patches) as well as prescription forms (mouth inhaler and nasal spray).  Medicines.This may include:  Antidepressant medicine that may reduce nicotine cravings.  A medicine that acts on nicotine receptors in the brain to reduce cravings and withdrawal symptoms. It may also block the effects of tobacco in people with TUD who relapse.  Counseling or talk therapy. A form of talk therapy called behavioral therapy is commonly used to treat people with TUD. Behavioral therapy looks at triggers for tobacco use, how to avoid them, and how to cope with cravings. It is most effective in person or by phone but is also available in self-help forms (books and Internet websites).  Support groups. These provide emotional support, advice, and guidance for quitting tobacco. The most effective treatment for TUD is usually a combination of medicine, talk therapy, and support groups. HOME CARE INSTRUCTIONS  Keep all follow-up visits as directed by your health care provider. This is important.  Take medicines only as directed by your health care provider.  Check with your health care provider before starting new prescription or over-the-counter medicines. SEEK MEDICAL CARE IF:  You are not able to take your medicines as prescribed.  Treatment is not helping your TUD and your symptoms get worse. SEEK IMMEDIATE MEDICAL CARE IF:  You have serious thoughts about hurting yourself or others.  You have trouble breathing, chest pain,  sudden weakness, or sudden numbness in part of your body.   This information is not intended to replace advice given to you by your health care provider. Make sure you discuss any questions you have with your health care provider.   Document Released: 04/09/2004 Document Revised: 08/25/2014 Document Reviewed: 09/30/2013 Elsevier Interactive Patient Education 2016 [employer]  

## 2015-09-13 NOTE — Progress Notes (Signed)
Patient would like to quit smoking and is asking for nicotine patches He is also checking his sugars more that 4 times and he would like to change his prescription so that he can get more strips He is taking 18 units Lantus at night

## 2015-09-27 ENCOUNTER — Telehealth: Payer: Self-pay

## 2015-09-27 NOTE — Telephone Encounter (Signed)
This Case Manager placed call to patient to check on status. Unable to reach patient; voicemail left requesting return call.

## 2015-10-01 IMAGING — CR DG HIP (WITH OR WITHOUT PELVIS) 2-3V*L*
3 series · 3 of 3 positions shown · non-contrast
Comparison: None.

CLINICAL DATA: Chronic pain

EXAM:
LEFT HIP - COMPLETE 2+ VIEW

[t pelvis a.p.]
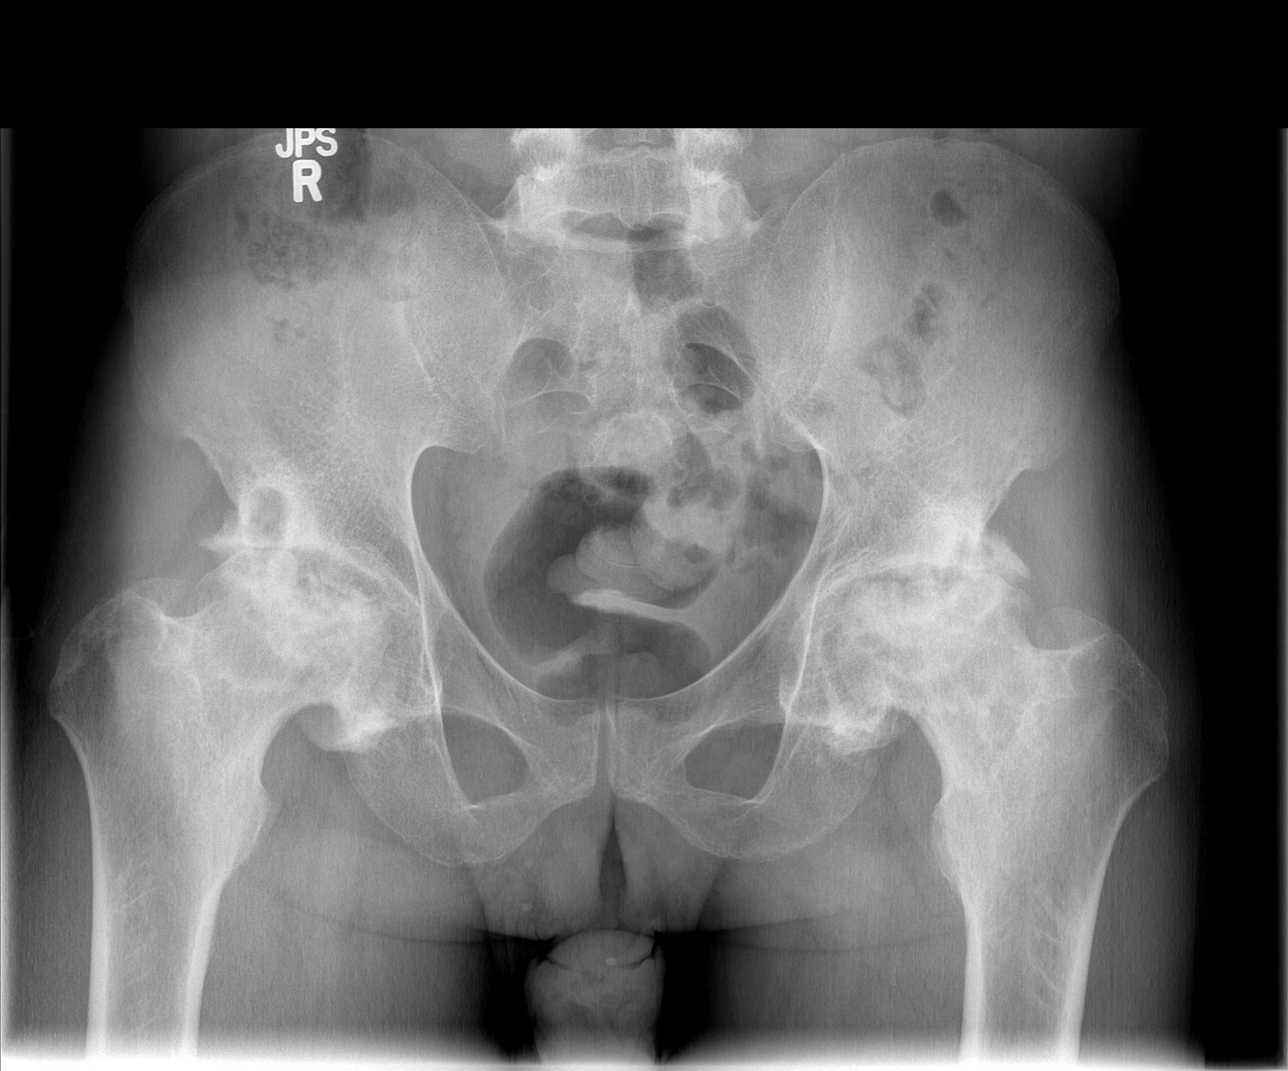

[t hip ap left]
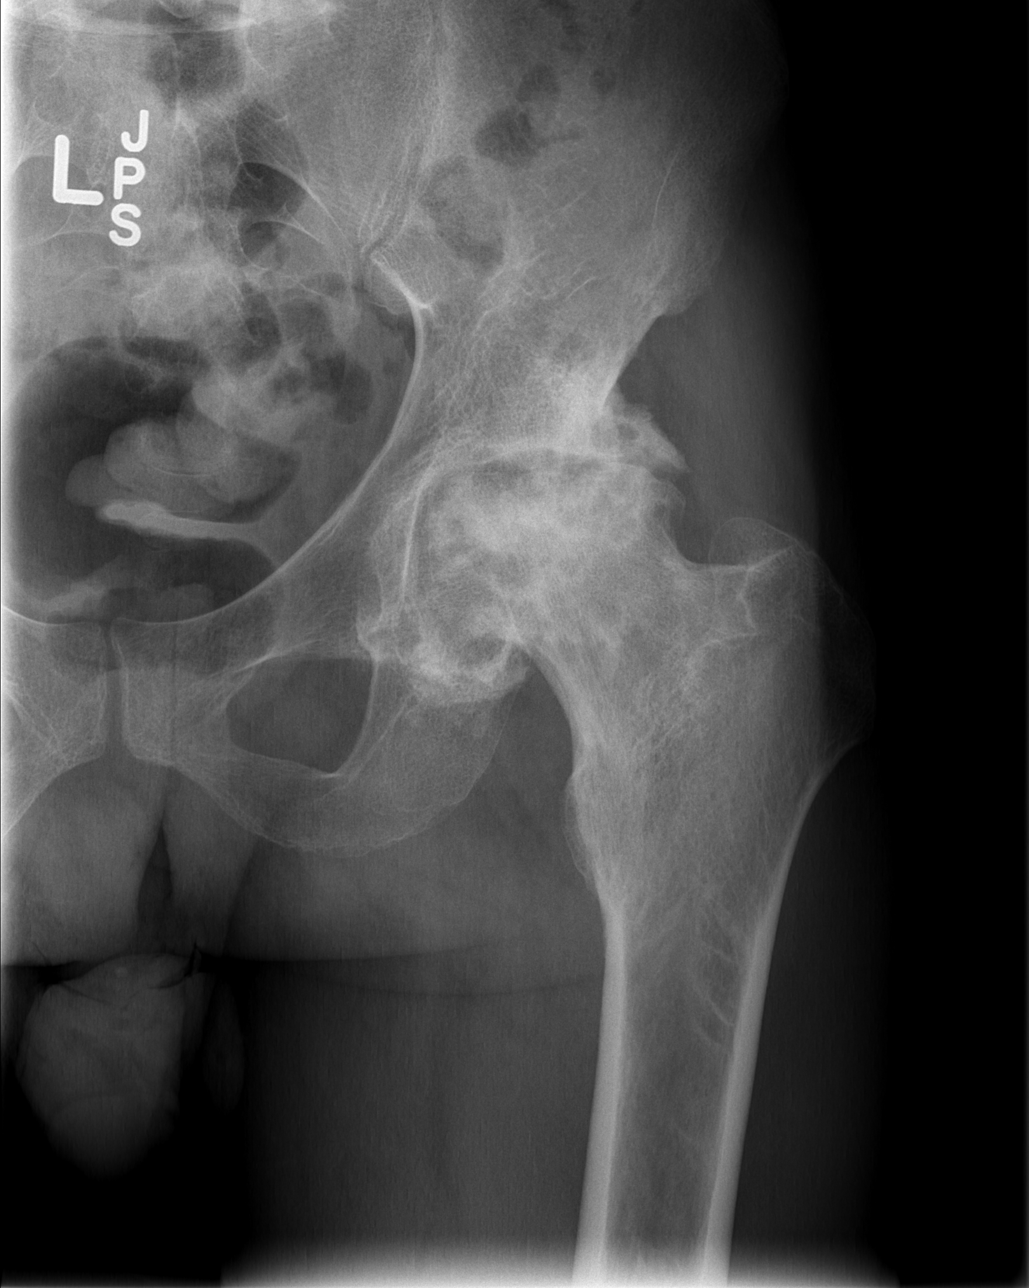

[t hip frog leg left]
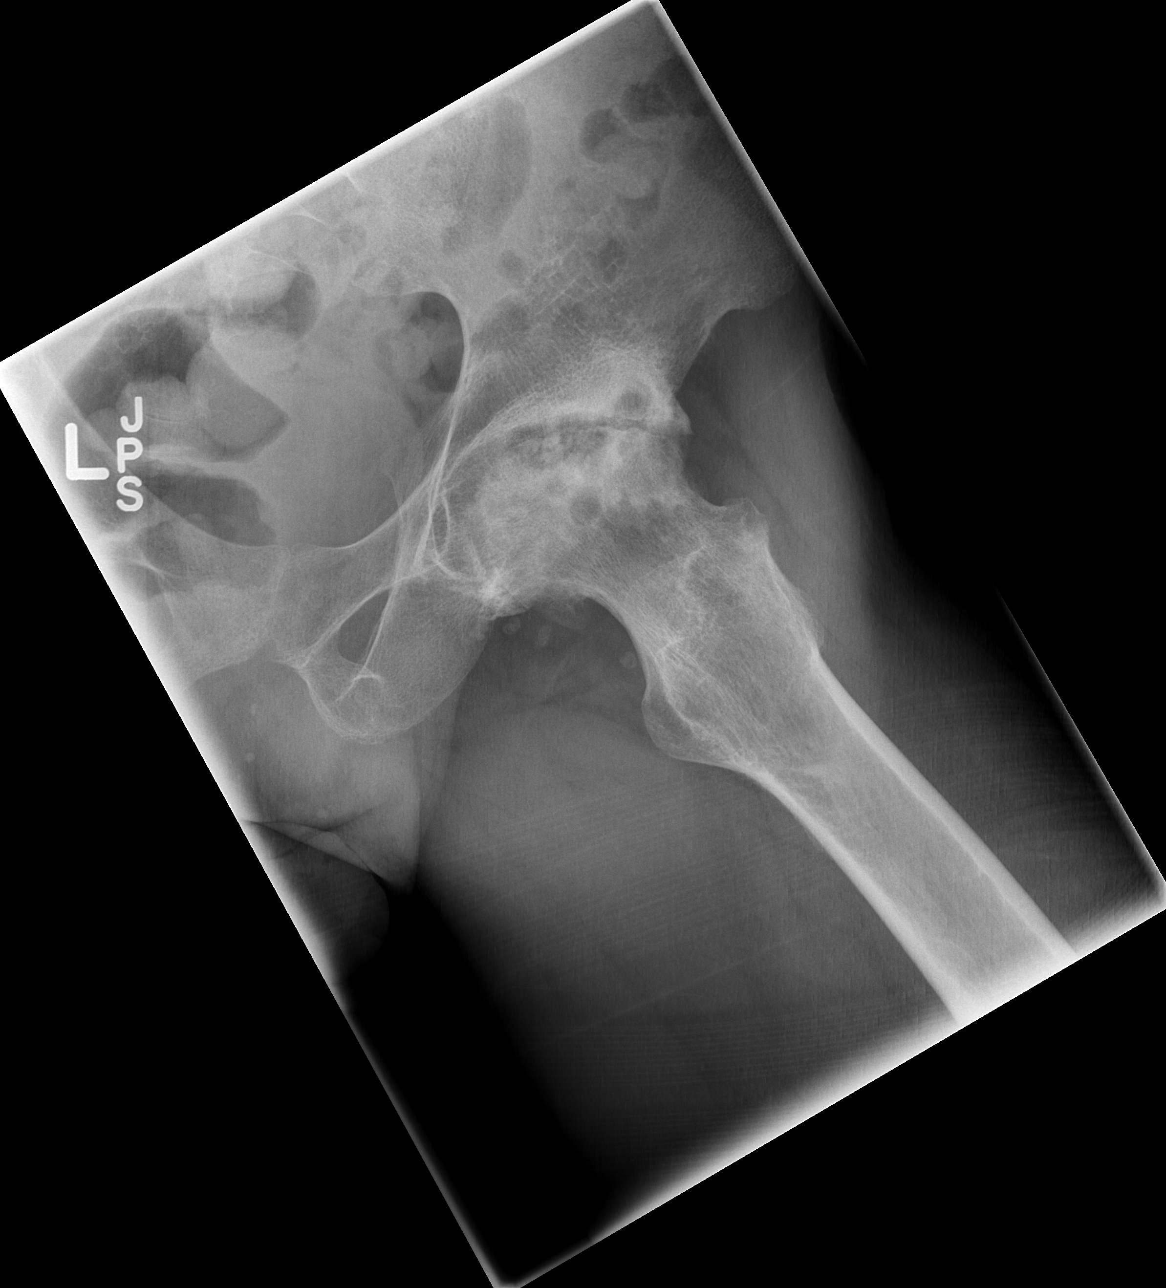

[3 of 3 positions shown; findings below may reference images not displayed]

FINDINGS: There is marked deformity of the left femoral head with subchondral
collapse, subchondral sclerosis and left hip joint space narrowing.
Additionally there is subchondral sclerosis and cystic change
involving the left acetabulum. No evidence for acute fracture or
dislocation.

Similar findings involving the right hip joint. SI joints are
unremarkable. Lower lumbar spine unremarkable.
IMPRESSION: Findings most compatible with advanced bilateral avascular necrosis
with subchondral collapse and marked degenerative change of the
bilateral hips.

## 2015-10-15 ENCOUNTER — Ambulatory Visit: Payer: Medicaid Other | Admitting: Family Medicine

## 2015-10-29 ENCOUNTER — Ambulatory Visit: Payer: Medicaid Other | Admitting: Family Medicine

## 2015-11-02 ENCOUNTER — Inpatient Hospital Stay (HOSPITAL_COMMUNITY)
Admission: EM | Admit: 2015-11-02 | Discharge: 2015-11-04 | DRG: 637 | Disposition: A | Discharge status: Home / Self Care | Payer: Medicaid Other | Attending: Internal Medicine | Admitting: Internal Medicine

## 2015-11-02 ENCOUNTER — Encounter (HOSPITAL_COMMUNITY): Payer: Self-pay | Admitting: Internal Medicine

## 2015-11-02 DIAGNOSIS — E1010 Type 1 diabetes mellitus with ketoacidosis without coma: Principal | ICD-10-CM

## 2015-11-02 DIAGNOSIS — E876 Hypokalemia: Secondary | ICD-10-CM | POA: Diagnosis present

## 2015-11-02 DIAGNOSIS — E860 Dehydration: Secondary | ICD-10-CM | POA: Diagnosis present

## 2015-11-02 DIAGNOSIS — R6510 Systemic inflammatory response syndrome (SIRS) of non-infectious origin without acute organ dysfunction: Secondary | ICD-10-CM | POA: Diagnosis present

## 2015-11-02 DIAGNOSIS — D72829 Elevated white blood cell count, unspecified: Secondary | ICD-10-CM | POA: Diagnosis not present

## 2015-11-02 DIAGNOSIS — E875 Hyperkalemia: Secondary | ICD-10-CM | POA: Diagnosis not present

## 2015-11-02 DIAGNOSIS — E43 Unspecified severe protein-calorie malnutrition: Secondary | ICD-10-CM | POA: Diagnosis not present

## 2015-11-02 DIAGNOSIS — Z794 Long term (current) use of insulin: Secondary | ICD-10-CM

## 2015-11-02 DIAGNOSIS — K219 Gastro-esophageal reflux disease without esophagitis: Secondary | ICD-10-CM | POA: Diagnosis present

## 2015-11-02 DIAGNOSIS — E109 Type 1 diabetes mellitus without complications: Secondary | ICD-10-CM | POA: Diagnosis present

## 2015-11-02 DIAGNOSIS — F17200 Nicotine dependence, unspecified, uncomplicated: Secondary | ICD-10-CM | POA: Diagnosis present

## 2015-11-02 DIAGNOSIS — E1110 Type 2 diabetes mellitus with ketoacidosis without coma: Secondary | ICD-10-CM | POA: Diagnosis present

## 2015-11-02 DIAGNOSIS — L409 Psoriasis, unspecified: Secondary | ICD-10-CM | POA: Diagnosis not present

## 2015-11-02 DIAGNOSIS — E0811 Diabetes mellitus due to underlying condition with ketoacidosis with coma: Secondary | ICD-10-CM | POA: Diagnosis not present

## 2015-11-02 HISTORY — DX: Type 2 diabetes mellitus with ketoacidosis without coma: E11.10

## 2015-11-02 LAB — CBG MONITORING, ED
GLUCOSE-CAPILLARY: 474 mg/dL — AB (ref 65–99)
GLUCOSE-CAPILLARY: 241 mg/dL — AB (ref 65–99)
Glucose-Capillary: 480 mg/dL — ABNORMAL HIGH (ref 65–99)
Glucose-Capillary: 373 mg/dL — ABNORMAL HIGH (ref 65–99)
Glucose-Capillary: 229 mg/dL — ABNORMAL HIGH (ref 65–99)

## 2015-11-02 LAB — I-STAT CHEM 8, ED
BUN: 18 mg/dL (ref 6–20)
CREATININE: 0.8 mg/dL (ref 0.61–1.24)
Calcium, Ion: 1.07 mmol/L — ABNORMAL LOW (ref 1.12–1.23)
Chloride: 108 mmol/L (ref 101–111)
GLUCOSE: 437 mg/dL — AB (ref 65–99)
HCT: 50 % (ref 39.0–52.0)
HEMOGLOBIN: 17 g/dL (ref 13.0–17.0)
POTASSIUM: 6.1 mmol/L — AB (ref 3.5–5.1)
Sodium: 137 mmol/L (ref 135–145)
TCO2: 6 mmol/L (ref 0–100)

## 2015-11-02 LAB — BASIC METABOLIC PANEL
BUN: 14 mg/dL (ref 6–20)
CHLORIDE: 108 mmol/L (ref 101–111)
CREATININE: 1.7 mg/dL — AB (ref 0.61–1.24)
Calcium: 8.5 mg/dL — ABNORMAL LOW (ref 8.9–10.3)
GFR calc Af Amer: 57 mL/min — ABNORMAL LOW (ref >60)
GFR calc non Af Amer: 49 mL/min — ABNORMAL LOW (ref >60)
Glucose, Bld: 316 mg/dL — ABNORMAL HIGH (ref 65–99)
POTASSIUM: 5.1 mmol/L (ref 3.5–5.1)
SODIUM: 140 mmol/L (ref 135–145)

## 2015-11-02 LAB — CBC WITH DIFFERENTIAL/PLATELET
Basophils Absolute: 0 10*3/uL (ref 0.0–0.1)
Basophils Relative: 0 %
Eosinophils Absolute: 0 10*3/uL (ref 0.0–0.7)
Eosinophils Relative: 0 %
HEMATOCRIT: 42.9 % (ref 39.0–52.0)
Hemoglobin: 14.9 g/dL (ref 13.0–17.0)
LYMPHS ABS: 1 10*3/uL (ref 0.7–4.0)
Lymphocytes Relative: 6 %
MCH: 32.6 pg (ref 26.0–34.0)
MCHC: 34.7 g/dL (ref 30.0–36.0)
MCV: 93.9 fL (ref 78.0–100.0)
Monocytes Absolute: 0.7 10*3/uL (ref 0.1–1.0)
Monocytes Relative: 4 %
NEUTROS PCT: 90 %
Neutro Abs: 14.6 10*3/uL — ABNORMAL HIGH (ref 1.7–7.7)
Platelets: 220 10*3/uL (ref 150–400)
RBC: 4.57 MIL/uL (ref 4.22–5.81)
RDW: 13.4 % (ref 11.5–15.5)
WBC: 16.3 10*3/uL — AB (ref 4.0–10.5)

## 2015-11-02 LAB — URINE MICROSCOPIC-ADD ON: RBC / HPF: NONE SEEN RBC/hpf (ref 0–5)

## 2015-11-02 LAB — URINALYSIS, ROUTINE W REFLEX MICROSCOPIC
BILIRUBIN URINE: NEGATIVE
Glucose, UA: >1000 mg/dL — AB
HGB URINE DIPSTICK: NEGATIVE
Leukocytes, UA: NEGATIVE
NITRITE: NEGATIVE
PROTEIN: NEGATIVE mg/dL
Specific Gravity, Urine: 1.024 (ref 1.005–1.030)
pH: 5 (ref 5.0–8.0)

## 2015-11-02 LAB — LIPASE, BLOOD: LIPASE: 86 U/L — AB (ref 11–51)

## 2015-11-02 LAB — I-STAT VENOUS BLOOD GAS, ED
Acid-base deficit: 23 mmol/L — ABNORMAL HIGH (ref 0.0–2.0)
Bicarbonate: 4.4 mEq/L — ABNORMAL LOW (ref 20.0–24.0)
O2 SAT: 71 %
PCO2 VEN: 14.9 mmHg — AB (ref 45.0–50.0)
pH, Ven: 7.079 — CL (ref 7.250–7.300)
pO2, Ven: 50 mmHg — ABNORMAL HIGH (ref 31.0–45.0)

## 2015-11-02 LAB — PROTIME-INR
INR: 1.16 (ref 0.00–1.49)
PROTHROMBIN TIME: 15 s (ref 11.6–15.2)

## 2015-11-02 LAB — PROCALCITONIN: PROCALCITONIN: 0.56 ng/mL

## 2015-11-02 LAB — APTT: aPTT: 21 seconds — ABNORMAL LOW (ref 24–37)

## 2015-11-02 LAB — MAGNESIUM: MAGNESIUM: 2 mg/dL (ref 1.7–2.4)

## 2015-11-02 MED ORDER — NICOTINE 14 MG/24HR TD PT24
14.0000 mg | MEDICATED_PATCH | Freq: Every day | TRANSDERMAL | Status: DC
  Administered 2015-11-04: 14 mg via TRANSDERMAL
  Filled 2015-11-02 (×3): qty 1

## 2015-11-02 MED ORDER — TRAMADOL HCL 50 MG PO TABS
100.0000 mg | ORAL_TABLET | Freq: Two times a day (BID) | ORAL | Status: DC | PRN
Start: 2015-11-02 — End: 2015-11-04

## 2015-11-02 MED ORDER — DEXTROSE-NACL 5-0.45 % IV SOLN: INTRAVENOUS | Status: DC

## 2015-11-02 MED ORDER — HYDROXYZINE HCL 50 MG/ML IM SOLN: 25.0000 mg | Freq: Four times a day (QID) | INTRAMUSCULAR | Status: DC | PRN

## 2015-11-02 MED ORDER — SODIUM CHLORIDE 0.9 % IV SOLN: INTRAVENOUS | Status: DC

## 2015-11-02 MED ORDER — DIPHENHYDRAMINE HCL 50 MG/ML IJ SOLN
25.0000 mg | Freq: Once | INTRAMUSCULAR | Status: AC
  Administered 2015-11-02: 25 mg via INTRAVENOUS
  Filled 2015-11-02: qty 1

## 2015-11-02 MED ORDER — TRIAMCINOLONE ACETONIDE 0.025 % EX CREA
1.0000 "application " | TOPICAL_CREAM | Freq: Two times a day (BID) | CUTANEOUS | Status: DC
  Administered 2015-11-04: 1 via TOPICAL
  Filled 2015-11-02: qty 15

## 2015-11-02 MED ORDER — SODIUM CHLORIDE 0.9 % IV BOLUS (SEPSIS)
1000.0000 mL | Freq: Once | INTRAVENOUS | Status: AC
  Administered 2015-11-02: 1000 mL via INTRAVENOUS

## 2015-11-02 MED ORDER — CLOBETASOL PROPIONATE 0.05 % EX CREA
1.0000 "application " | TOPICAL_CREAM | Freq: Two times a day (BID) | CUTANEOUS | Status: DC
  Administered 2015-11-04: 1 via TOPICAL
  Filled 2015-11-02: qty 15

## 2015-11-02 MED ORDER — DEXTROSE-NACL 5-0.45 % IV SOLN
INTRAVENOUS | Status: DC
  Administered 2015-11-02: 23:00:00 via INTRAVENOUS

## 2015-11-02 MED ORDER — SODIUM CHLORIDE 0.9 % IV BOLUS (SEPSIS): 1000.0000 mL | Freq: Once | INTRAVENOUS | Status: DC

## 2015-11-02 MED ORDER — PANTOPRAZOLE SODIUM 40 MG IV SOLR
40.0000 mg | INTRAVENOUS | Status: DC
  Administered 2015-11-03 (×2): 40 mg via INTRAVENOUS
  Filled 2015-11-02 (×2): qty 40

## 2015-11-02 MED ORDER — ENOXAPARIN SODIUM 40 MG/0.4ML ~~LOC~~ SOLN
40.0000 mg | Freq: Every day | SUBCUTANEOUS | Status: DC
  Filled 2015-11-02 (×2): qty 0.4

## 2015-11-02 MED ORDER — METOCLOPRAMIDE HCL 5 MG/ML IJ SOLN
10.0000 mg | Freq: Once | INTRAMUSCULAR | Status: AC
  Administered 2015-11-02: 10 mg via INTRAVENOUS
  Filled 2015-11-02: qty 2

## 2015-11-02 MED ORDER — SODIUM CHLORIDE 0.9 % IV SOLN
INTRAVENOUS | Status: DC
  Administered 2015-11-02: 4.2 [IU]/h via INTRAVENOUS
  Filled 2015-11-02: qty 2.5

## 2015-11-02 MED ORDER — SODIUM CHLORIDE 0.9 % IV SOLN
INTRAVENOUS | Status: DC
  Filled 2015-11-02: qty 2.5

## 2015-11-02 NOTE — ED Notes (Signed)
Per EMS: Pt complaining of dehydration, nausea, vomiting. Dry mucus membranes. CBG  = 472. Pt taking Lantis, but no Novolog. HX DKA in Oct. Of 2016.

## 2015-11-02 NOTE — ED Provider Notes (Signed)
CSN: [id number]     Arrival date & time 11/02/15  1757 History   First MD Initiated Contact with Patient 11/02/15 1808     Chief Complaint  Patient presents with  . Nausea  . Emesis     (Consider location/radiation/quality/duration/timing/severity/associated sxs/prior Treatment) Patient is a 40 y.o. male presenting with vomiting. The history is provided by the patient and the EMS personnel. The history is limited by the condition of the patient.  Emesis Severity:  Severe Duration:  1 day Timing:  Constant Number of daily episodes:  10 Quality:  Unable to specify Progression:  Worsening Chronicity:  Recurrent Recent urination:  Increased Context: not post-tussive and not self-induced   Relieved by:  Nothing Ineffective treatments:  None tried Associated symptoms: fever (subjective)   Associated symptoms: no abdominal pain, no arthralgias, no chills, no cough, no diarrhea, no headaches, no myalgias, no sore throat and no URI   Risk factors: sick contacts      Jared Murray is a 40 year old male with history of type 1 diabetes, poorly managed, with multiple admissions for DKA, psoriasis, GERD, patient presents to the emergency department for 1 day of nausea and vomiting that began yesterday after he felt worsening reflux and began vomiting.Marland Kitchen He states that he has vomited approximately 10 times since yesterday, emesis is nonbloody nonbilious.  He denies abdominal pain currently, but had some epigastric and chest burning associated with his GERD last night   He had one loose BM this morning, he denies hematochezia, melena.  The pt states that he did not take his novolog over the past day specifically because he was told not to take if he was not going to eat and he has not been eating very much secondary to nausea. He reports that he has been compliant with his Lantus dosing.  He complains of dehydration and dry mouth. He states this does not feel like other times he is admitted for DKA  because he believes he may have a stomach virus and subjective fever.  He has multiple sick contacts.  He denies abdominal pain, chest pain, shortness of breath, diarrhea.  Past Medical History  Diagnosis Date  . Eczema   . Type 1 diabetes mellitus ([place])   . Psoriasis   . GERD (gastroesophageal reflux disease)   . DKA (diabetic ketoacidoses) [facility])    Past Surgical History  Procedure Laterality Date  . Finger surgery     Family History  Problem Relation Age of Onset  . Hypertension Father   . Diabetes      multiple  . Lupus Cousin   . Stroke Maternal Grandmother   . Stroke Paternal Grandmother    Social History  Substance Use Topics  . Smoking status: Current Some Day Smoker -- 0.25 packs/day for 10 years    Types: Cigarettes  . Smokeless tobacco: Never Used     Comment: Smoking 2-3 cigs/week  . Alcohol Use: 0.0 oz/week    0 Standard drinks or equivalent per week     Comment: ocassional    Review of Systems  Constitutional: Positive for fever, activity change, appetite change and fatigue. Negative for chills and diaphoresis.  HENT: Negative.  Negative for sore throat.   Eyes: Negative.   Respiratory: Negative.   Cardiovascular: Negative.   Gastrointestinal: Positive for nausea and vomiting. Negative for abdominal pain, diarrhea, constipation, blood in stool, abdominal distention, anal bleeding and rectal pain.  Endocrine: Negative.   Genitourinary: Negative.   Musculoskeletal: Negative for myalgias  and arthralgias.  Skin: Negative.   Neurological: Positive for weakness and light-headedness. Negative for tremors, syncope and headaches.  Psychiatric/Behavioral: Negative.       Allergies  Latex and Penicillins  Home Medications   Prior to Admission medications   Medication Sig Start Date End Date Taking? Authorizing Provider  clobetasol cream (TEMOVATE) [id number] % Apply 1 application topically 2 (two) times daily. 04/13/15  Yes [name], MD  feeding supplement,  GLUCERNA SHAKE, (GLUCERNA SHAKE) LIQD Take 237 mLs by mouth 3 (three) times daily between meals. 07/06/15  Yes [name], MD  insulin glargine (LANTUS) 100 UNIT/ML injection Inject 0.18 mLs (18 Units total) into the skin at bedtime. 09/13/15  Yes [name], MD  NOVOLOG 100 UNIT/ML injection 5 units subcutaneous before breakfast, 7 units before lunch and 7 units before dinner. 09/13/15  Yes [name], MD  pantoprazole (PROTONIX) 40 MG tablet Take 1 tablet (40 mg total) by mouth daily. 01/29/15  Yes [name], MD  traMADol (ULTRAM) 50 MG tablet Take 2 tablets (100 mg total) by mouth every 12 (twelve) hours as needed. Patient taking differently: Take 100 mg by mouth every 12 (twelve) hours as needed for moderate pain.  07/06/15  Yes [name], MD  triamcinolone (KENALOG) 0.025 % cream Apply 1 application topically 2 (two) times daily.   Yes Historical Provider, MD  Blood Glucose Monitoring Suppl (ACCU-CHEK AVIVA) device Use as instructed 06/27/15 06/26/16  Leone Brand, MD  glucose blood test strip Accu-chek aviva. Check blood sugar 3 times before meals and at bedtime 09/13/15   [name], MD  Insulin Syringe-Needle U-100 (B-D INS SYRINGE 2CC/29GX1/2") 29G X 1/2" 2 ML MISC Check Blood sugar TID and QHS 03/22/14   Olugbemiga E Doreene Burke, MD  nicotine (NICODERM CQ - DOSED IN MG/24 HOURS) 14 mg/24hr patch Place 1 patch (14 mg total) onto the skin daily. Patient not taking: Reported on 11/02/2015 09/13/15   [name], MD   BP 116/87 mmHg  Pulse 97  Temp(Src) 98.3 F (36.8 C) (Oral)  Resp 20  SpO2 100% Physical Exam  Constitutional: He is oriented to person, place, and time. He appears cachectic. He is cooperative. He has a sickly appearance. He appears ill. He appears distressed.  HENT:  Head: Normocephalic and atraumatic.  Right Ear: External ear normal.  Left Ear: External ear normal.  Nose: Nose normal.  Mouth/Throat: Uvula is midline. Mucous membranes are not pale, dry and not  cyanotic.  Eyes: Pupils are equal, round, and reactive to light. Right eye exhibits no discharge. Left eye exhibits no discharge.  Neck: Normal range of motion.  Cardiovascular: Regular rhythm.  Tachycardia present.  Exam reveals no gallop and no friction rub.   No murmur heard. Pulses:      Radial pulses are 2+ on the right side, and 2+ on the left side.  Pulmonary/Chest: No stridor. Tachypnea noted. He has no decreased breath sounds. He has no wheezes. He has no rhonchi. He has no rales.  Kussmaul respirations  Abdominal: Soft. Bowel sounds are normal. There is no tenderness.  Extremely thin abdomen, NTND  Musculoskeletal: Normal range of motion.  Neurological: He is alert and oriented to person, place, and time. No cranial nerve deficit. GCS eye subscore is 4. GCS verbal subscore is 5. GCS motor subscore is 6.  Skin: Skin is warm and dry. No rash noted. No erythema.  Psychiatric: He has a normal mood and affect. His speech is delayed. He is slowed.  Nursing  note and vitals reviewed.   ED Course  Procedures (including critical care time) Labs Review Labs Reviewed  URINALYSIS, ROUTINE W REFLEX MICROSCOPIC (NOT AT [facility]) - Abnormal; Notable for the following:    Glucose, UA >1000 (*)    Ketones, ur >80 (*)    All other components within normal limits  CBC WITH DIFFERENTIAL/PLATELET - Abnormal; Notable for the following:    WBC 16.3 (*)    Neutro Abs 14.6 (*)    All other components within normal limits  URINE MICROSCOPIC-ADD ON - Abnormal; Notable for the following:    Squamous Epithelial / LPF 0-5 (*)    Bacteria, UA RARE (*)    Casts HYALINE CASTS (*)    All other components within normal limits  BASIC METABOLIC PANEL - Abnormal; Notable for the following:    CO2 <7 (*)    Glucose, Bld 316 (*)    Creatinine, Ser 1.70 (*)    Calcium 8.5 (*)    GFR calc non Af Amer 49 (*)    GFR calc Af Amer 57 (*)    All other components within normal limits  BASIC METABOLIC PANEL -  Abnormal; Notable for the following:    CO2 10 (*)    Glucose, Bld 243 (*)    Creatinine, Ser 1.39 (*)    Anion gap 22 (*)    All other components within normal limits  LIPASE, BLOOD - Abnormal; Notable for the following:    Lipase 86 (*)    All other components within normal limits  APTT - Abnormal; Notable for the following:    aPTT 21 (*)    All other components within normal limits  I-STAT VENOUS BLOOD GAS, ED - Abnormal; Notable for the following:    pH, Ven 7.079 (*)    pCO2, Ven 14.9 (*)    pO2, Ven 50.0 (*)    Bicarbonate 4.4 (*)    Acid-base deficit 23.0 (*)    All other components within normal limits  I-STAT CHEM 8, ED - Abnormal; Notable for the following:    Potassium 6.1 (*)    Glucose, Bld 437 (*)    Calcium, Ion 1.07 (*)    All other components within normal limits  CBG MONITORING, ED - Abnormal; Notable for the following:    Glucose-Capillary 480 (*)    All other components within normal limits  CBG MONITORING, ED - Abnormal; Notable for the following:    Glucose-Capillary 474 (*)    All other components within normal limits  CBG MONITORING, ED - Abnormal; Notable for the following:    Glucose-Capillary 373 (*)    All other components within normal limits  CBG MONITORING, ED - Abnormal; Notable for the following:    Glucose-Capillary 229 (*)    All other components within normal limits  CBG MONITORING, ED - Abnormal; Notable for the following:    Glucose-Capillary 241 (*)    All other components within normal limits  CBG MONITORING, ED - Abnormal; Notable for the following:    Glucose-Capillary 227 (*)    All other components within normal limits  CULTURE, BLOOD (ROUTINE X 2)  CULTURE, BLOOD (ROUTINE X 2)  MAGNESIUM  PROCALCITONIN  PROTIME-INR  BASIC METABOLIC PANEL  BASIC METABOLIC PANEL  BASIC METABOLIC PANEL  LACTIC ACID, PLASMA  LACTIC ACID, PLASMA    Imaging Review No results found. I have personally reviewed and evaluated these images and  lab results as part of my medical decision-making.   EKG  Interpretation   Date/Time:  Friday November 02 2015 18:10:50 EDT Ventricular Rate:  104 PR Interval:  139 QRS Duration: 136 QT Interval:  378 QTC Calculation: 497 R Axis:   -87 Text Interpretation:  Sinus tachycardia Biatrial enlargement RBBB and LAFB  Inferior infarct, acute Borderline ST elevation, anterior leads No  significant change was found Confirmed by CAMPOS  MD, KEVIN ([id number]) on  11/02/2015 8:32:42 PM      MDM   Pt with PMHx of type 1 DM, presents to the ER via EMS with complaints of dehydration, N & V with CBG 472.  Pt appears cachectic, tired, and ill appearing, with Kussmal respirations, A&O x 3  Suspect pt is in DKA and will need to be admitted.  IV and labs have been difficult to obtain.  He was given Reglan and Benadryl, without any further emesis.  9:15 PM - UA positive for greater than 80 ketones, VBG significant for pH 7.079.  Chem 8 significant for hyperkalemia. EKG has peaked T waves, pt currently monitored and receiving glucose stabilizer.  A&O x 3. Chemistry pertinent for AG 25 and, AKI with elevated sCR, 1.70. Rectal temp pending. Consulted unassigned for admission, will need step-down bed for DKA.   Delos Haring PA-C given report and shift handoff, will finish getting pt admitted.   CRITICAL CARE Performed by: Delsa Grana Total critical care time: 45 Critical care time was exclusive of separately billable procedures and treating other patients. Critical care was necessary to treat or prevent imminent or life-threatening deterioration. Critical care was time spent personally by me on the following activities: development of treatment plan with patient and/or surrogate as well as nursing, discussions with consultants, evaluation of patient's response to treatment, examination of patient, obtaining history from patient or surrogate, ordering and performing treatments and interventions, ordering and  review of laboratory studies, ordering and review of radiographic studies, pulse oximetry and re-evaluation of patient's condition.  Final diagnoses:  Diabetic ketoacidosis without coma associated with type 1 diabetes mellitus ([place])       Delsa Grana, PA-C 11/03/15 0153  Jola Schmidt, MD 11/05/15 [phone]

## 2015-11-02 NOTE — ED Provider Notes (Signed)
Patient admitted to stepdown for DKA.  [facility] admits, inpatient, stepdown, [facility] Dr. Blaine Hamper, X.  @ 9:30 pm- patient was evaluated. Some shortness of breath but mentating at baseline, says he still doesn't feel well but that he is feeling better. Requests warm blanket and ice chips.  Delos Haring, PA-C 11/02/15 2133  Jola Schmidt, MD 11/02/15 [phone]

## 2015-11-02 NOTE — H&P (Cosign Needed)
[facility] History and Physical  KIMMIE ANDO [id number] DOB: 07-Apr-1976 DOA: 11/02/2015  Referring physician: ED physician PCP: Lorayne Marek, MD  Specialists:   Chief Complaint: Nausea, vomiting  HPI: Jared Murray is a 40 y.o. male with PMH of type 1 diabetes, DKA, psoriasis, eczema, GERD, who presents with nausea and vomiting.  Patient reports that he started having nausea and vomiting since last night. He vomited more than 15 times without blood in the vomitus. He had acid reflux symptoms and mild abdominal pain which has resolved. He does not have fever, chills, diarrhea. No chest pain, cough or shortness of breath. No symptoms of UTI or unilateral weakness. He has decreased oral intake and generalized weakness.   In ED, patient was found to have elevated anion gap of 23 with bicarbonate 6 and potassium 6, WBC 16.3, negative urinalysis for UTI, but positive for ketone, tachycardia, tachypnea. Patient is admitted to inpatient for further evaluation and treatment.  EKG: Independently reviewed. QTC 497, T-wave peaking in precordial leads, bifascicular AV block  Where does patient live?   At home  Can patient participate in ADLs?  Yes  Review of Systems:   General: no fevers, chills, no changes in body weight, has poor appetite, has fatigue HEENT: no blurry vision, hearing changes or sore throat Pulm: no dyspnea, coughing, wheezing CV: no chest pain, no palpitations Abd: has nausea, vomiting, abdominal pain, no diarrhea, constipation GU: no dysuria, burning on urination, increased urinary frequency, hematuria  Ext: no leg edema Neuro: no unilateral weakness, numbness, or tingling, no vision change or hearing loss Skin: no rash MSK: No muscle spasm, no deformity, no limitation of range of movement in spin Heme: No easy bruising.  Travel history: No recent long distant travel.  Allergy:  Allergies  Allergen Reactions  . Latex Itching and Rash  . Penicillins  Other (See Comments)    Childhood allergy Has patient had a PCN reaction causing immediate rash, facial/tongue/throat swelling, SOB or lightheadedness with hypotension: NO Has patient had a PCN reaction causing severe rash involving mucus membranes or skin necrosis:NO Has patient had a PCN reaction that required hospitalization NO Has patient had a PCN reaction occurring within the last 10 years: NO If all of the above answers are "NO", then may proceed with Cephalosporin use.    Past Medical History  Diagnosis Date  . Eczema   . Type 1 diabetes mellitus ([place])   . Psoriasis   . GERD (gastroesophageal reflux disease)   . DKA (diabetic ketoacidoses) [facility])     Past Surgical History  Procedure Laterality Date  . Finger surgery      Social History:  reports that he has been smoking Cigarettes.  He has a 2.5 pack-year smoking history. He has never used smokeless tobacco. He reports that he drinks alcohol. He reports that he does not use illicit drugs.  Family History:  Family History  Problem Relation Age of Onset  . Hypertension Father   . Diabetes      multiple  . Lupus Cousin   . Stroke Maternal Grandmother   . Stroke Paternal Grandmother      Prior to Admission medications   Medication Sig Start Date End Date Taking? Authorizing Provider  clobetasol cream (TEMOVATE) [id number] % Apply 1 application topically 2 (two) times daily. 04/13/15  Yes [name], MD  feeding supplement, GLUCERNA SHAKE, (GLUCERNA SHAKE) LIQD Take 237 mLs by mouth 3 (three) times daily between meals. 07/06/15  Yes [name], MD  insulin glargine (LANTUS) 100 UNIT/ML injection Inject 0.18 mLs (18 Units total) into the skin at bedtime. 09/13/15  Yes [name], MD  NOVOLOG 100 UNIT/ML injection 5 units subcutaneous before breakfast, 7 units before lunch and 7 units before dinner. 09/13/15  Yes [name], MD  pantoprazole (PROTONIX) 40 MG tablet Take 1 tablet (40 mg total) by mouth daily. 01/29/15  Yes  [name], MD  traMADol (ULTRAM) 50 MG tablet Take 2 tablets (100 mg total) by mouth every 12 (twelve) hours as needed. Patient taking differently: Take 100 mg by mouth every 12 (twelve) hours as needed for moderate pain.  07/06/15  Yes [name], MD  triamcinolone (KENALOG) 0.025 % cream Apply 1 application topically 2 (two) times daily.   Yes Historical Provider, MD  Blood Glucose Monitoring Suppl (ACCU-CHEK AVIVA) device Use as instructed 06/27/15 06/26/16  Leone Brand, MD  glucose blood test strip Accu-chek aviva. Check blood sugar 3 times before meals and at bedtime 09/13/15   [name], MD  Insulin Syringe-Needle U-100 (B-D INS SYRINGE 2CC/29GX1/2") 29G X 1/2" 2 ML MISC Check Blood sugar TID and QHS 03/22/14   Olugbemiga E Doreene Burke, MD  nicotine (NICODERM CQ - DOSED IN MG/24 HOURS) 14 mg/24hr patch Place 1 patch (14 mg total) onto the skin daily. Patient not taking: Reported on 11/02/2015 09/13/15   [name], MD    Physical Exam: Filed Vitals:   11/02/15 1945 11/02/15 2030 11/02/15 2045 11/02/15 2144  BP: 140/93 132/91 140/90   Pulse: 105 113 109   Temp:    98.3 F (36.8 C)  TempSrc:    Oral  Resp: 18 27 25    SpO2: 100% 100% 100%    General: Not in acute distress, dry mucus membrane HEENT:       Eyes: PERRL, EOMI, no scleral icterus.       ENT: No discharge from the ears and nose, no pharynx injection, no tonsillar enlargement.        Neck: No JVD, no bruit, no mass felt. Heme: No neck lymph node enlargement. Cardiac: S1/S2, RRR, tachycardia, No murmurs, No gallops or rubs. Pulm: No rales, wheezing, rhonchi or rubs. Abd: Soft, nondistended, nontender, no rebound pain, no organomegaly, BS present. Ext: No pitting leg edema bilaterally. 2+DP/PT pulse bilaterally. Musculoskeletal: No joint deformities, No joint redness or warmth, no limitation of ROM in spin. Skin: No rashes.  Neuro: Alert, oriented X3, cranial nerves II-XII grossly intact, moves all extremities normally.   Psych: Patient is not psychotic, no suicidal or hemocidal ideation.  Labs on Admission:  Basic Metabolic Panel:  Recent Labs Lab 11/02/15 2047  NA 137  K 6.1*  CL 108  GLUCOSE 437*  BUN 18  CREATININE 0.80   Liver Function Tests: No results for input(s): AST, ALT, ALKPHOS, BILITOT, PROT, ALBUMIN in the last 168 hours. No results for input(s): LIPASE, AMYLASE in the last 168 hours. No results for input(s): AMMONIA in the last 168 hours. CBC:  Recent Labs Lab 11/02/15 2041 11/02/15 2047  WBC 16.3*  --   NEUTROABS 14.6*  --   HGB 14.9 17.0  HCT 42.9 50.0  MCV 93.9  --   PLT 220  --    Cardiac Enzymes: No results for input(s): CKTOTAL, CKMB, CKMBINDEX, TROPONINI in the last 168 hours.  BNP (last 3 results) No results for input(s): BNP in the last 8760 hours.  ProBNP (last 3 results) No results for input(s): PROBNP in the last 8760 hours.  CBG:  Recent Labs Lab 11/02/15 1858 11/02/15 2009 11/02/15 2116  GLUCAP 480* 474* 373*    Radiological Exams on Admission: No results found.  Assessment/Plan Principal Problem:   DKA (diabetic ketoacidosis) ([facility]) Active Problems:   Protein-calorie malnutrition, severe ([facility])   Psoriasis   Dehydration   GERD (gastroesophageal reflux disease)   Hyperkalemia   Type 1 diabetes mellitus ([facility])   SIRS (systemic inflammatory response syndrome) ([facility])   Tobacco use disorder   DKA (diabetic ketoacidoses) ([facility])   Leukocytosis   Principal Problem:   DKA (diabetic ketoacidosis) ([place]) Active Problems:   Protein-calorie malnutrition, severe ([facility])   Psoriasis   Dehydration   GERD (gastroesophageal reflux disease)   Hyperkalemia   Type 1 diabetes mellitus ([facility])   SIRS (systemic inflammatory response syndrome) ([facility])   Tobacco use disorder   DKA (diabetic ketoacidoses) ([facility])   Leukocytosis  DKA: patient has severe DKA with AG of 23 and bicarbonate of 6. Mental status is OK. No obvious source of infection identified -  Admit to stepdown  - Received 3L of NS bolus - start DKA protocol with BMP q3h - IVF: NS 125 cc/h; will switch to D5-1/2NS when CBG<250 - replete K as needed, currently hyperkalemia with K=6.1 - Hydroxyzine prn nausea  - NPO  -check Lipase level  SIRS vs. sepsis: he meets criteria for sepsis or SIRS, with leukocytosis and tachycardia. No obvious source of infection identified. UA negative. -will get Procalcitonin and trend lactic acid level per sepsis protocol -IVF: received 3L of NS bolus in ED, followed by 125cc/h - blood culture x 2  Hyperkalemia: Potassium 6.1 with T-wave change. Secondary to DKA and AKi. -Expecting correction with insulin drip -BMP q3h   DM-II: Last A1c 12.4 on 08/23/15, poorly controlled. Patient is taking Lantus 18 units daily and sliding scale insulin at home -On insulin gtt for DKA now  Psoriasis: -continue home Temovate and Kenalog topically  GERD: -Protonix IV  Tobacco abuse: -Did counseling about importance of quitting smoking -Nicotine patch   DVT ppx: SQ Lovenox  Code Status: Full code Family Communication: None at bed side. Disposition Plan: Admit to inpatient   Date of Service 11/02/2015    Ivor Costa [facility] Pager [phone]  If 7PM-7AM, please contact night-coverage [email or web address] Password [email or web address] 11/02/2015, 9:50 PM

## 2015-11-03 ENCOUNTER — Encounter (HOSPITAL_COMMUNITY): Payer: Self-pay

## 2015-11-03 DIAGNOSIS — E43 Unspecified severe protein-calorie malnutrition: Secondary | ICD-10-CM

## 2015-11-03 LAB — CBG MONITORING, ED
GLUCOSE-CAPILLARY: 197 mg/dL — AB (ref 65–99)
GLUCOSE-CAPILLARY: 184 mg/dL — AB (ref 65–99)
GLUCOSE-CAPILLARY: 127 mg/dL — AB (ref 65–99)
GLUCOSE-CAPILLARY: 152 mg/dL — AB (ref 65–99)
GLUCOSE-CAPILLARY: 154 mg/dL — AB (ref 65–99)
Glucose-Capillary: 113 mg/dL — ABNORMAL HIGH (ref 65–99)
Glucose-Capillary: 149 mg/dL — ABNORMAL HIGH (ref 65–99)
Glucose-Capillary: 168 mg/dL — ABNORMAL HIGH (ref 65–99)
Glucose-Capillary: 170 mg/dL — ABNORMAL HIGH (ref 65–99)
Glucose-Capillary: 227 mg/dL — ABNORMAL HIGH (ref 65–99)
Glucose-Capillary: 89 mg/dL (ref 65–99)
Glucose-Capillary: 92 mg/dL (ref 65–99)
Glucose-Capillary: 93 mg/dL (ref 65–99)
Glucose-Capillary: 93 mg/dL (ref 65–99)

## 2015-11-03 LAB — BASIC METABOLIC PANEL
Anion gap: 22 — ABNORMAL HIGH (ref 5–15)
Anion gap: 14 (ref 5–15)
Anion gap: 10 (ref 5–15)
Anion gap: 11 (ref 5–15)
BUN: 10 mg/dL (ref 6–20)
BUN: 12 mg/dL (ref 6–20)
BUN: 8 mg/dL (ref 6–20)
BUN: 8 mg/dL (ref 6–20)
CHLORIDE: 109 mmol/L (ref 101–111)
CHLORIDE: 110 mmol/L (ref 101–111)
CHLORIDE: 110 mmol/L (ref 101–111)
CHLORIDE: 110 mmol/L (ref 101–111)
CO2: 10 mmol/L — ABNORMAL LOW (ref 22–32)
CO2: 17 mmol/L — ABNORMAL LOW (ref 22–32)
CO2: 20 mmol/L — ABNORMAL LOW (ref 22–32)
CO2: 20 mmol/L — ABNORMAL LOW (ref 22–32)
Calcium: 8.9 mg/dL (ref 8.9–10.3)
Calcium: 9 mg/dL (ref 8.9–10.3)
Calcium: 8.8 mg/dL — ABNORMAL LOW (ref 8.9–10.3)
Calcium: 8.6 mg/dL — ABNORMAL LOW (ref 8.9–10.3)
Creatinine, Ser: 1.39 mg/dL — ABNORMAL HIGH (ref 0.61–1.24)
Creatinine, Ser: 0.94 mg/dL (ref 0.61–1.24)
Creatinine, Ser: 0.78 mg/dL (ref 0.61–1.24)
Creatinine, Ser: 0.77 mg/dL (ref 0.61–1.24)
GFR calc Af Amer: >60 mL/min (ref >60)
GFR calc Af Amer: >60 mL/min (ref >60)
GFR calc Af Amer: >60 mL/min (ref >60)
GFR calc Af Amer: >60 mL/min (ref >60)
GFR calc non Af Amer: >60 mL/min (ref >60)
GFR calc non Af Amer: >60 mL/min (ref >60)
GFR calc non Af Amer: >60 mL/min (ref >60)
GFR calc non Af Amer: >60 mL/min (ref >60)
GLUCOSE: 162 mg/dL — AB (ref 65–99)
GLUCOSE: 243 mg/dL — AB (ref 65–99)
GLUCOSE: 93 mg/dL (ref 65–99)
Glucose, Bld: 106 mg/dL — ABNORMAL HIGH (ref 65–99)
POTASSIUM: 3.8 mmol/L (ref 3.5–5.1)
POTASSIUM: 4 mmol/L (ref 3.5–5.1)
POTASSIUM: 4.6 mmol/L (ref 3.5–5.1)
POTASSIUM: 4.7 mmol/L (ref 3.5–5.1)
SODIUM: 141 mmol/L (ref 135–145)
Sodium: 142 mmol/L (ref 135–145)
Sodium: 140 mmol/L (ref 135–145)
Sodium: 140 mmol/L (ref 135–145)

## 2015-11-03 LAB — GLUCOSE, CAPILLARY
Glucose-Capillary: 138 mg/dL — ABNORMAL HIGH (ref 65–99)
Glucose-Capillary: 63 mg/dL — ABNORMAL LOW (ref 65–99)
Glucose-Capillary: 90 mg/dL (ref 65–99)

## 2015-11-03 LAB — LACTIC ACID, PLASMA
LACTIC ACID, VENOUS: 0.9 mmol/L (ref 0.5–2.0)
Lactic Acid, Venous: 1.5 mmol/L (ref 0.5–2.0)

## 2015-11-03 LAB — MRSA PCR SCREENING: MRSA by PCR: NEGATIVE

## 2015-11-03 MED ORDER — INSULIN GLARGINE 100 UNIT/ML ~~LOC~~ SOLN
15.0000 [IU] | Freq: Every day | SUBCUTANEOUS | Status: DC
  Administered 2015-11-03 – 2015-11-04 (×2): 15 [IU] via SUBCUTANEOUS
  Filled 2015-11-03 (×2): qty 0.15

## 2015-11-03 MED ORDER — INSULIN ASPART 100 UNIT/ML ~~LOC~~ SOLN: 0.0000 [IU] | Freq: Every day | SUBCUTANEOUS | Status: DC

## 2015-11-03 MED ORDER — DEXTROSE 50 % IV SOLN
1.0000 | INTRAVENOUS | Status: AC
  Filled 2015-11-03: qty 50

## 2015-11-03 MED ORDER — DEXTROSE-NACL 5-0.45 % IV SOLN
INTRAVENOUS | Status: AC
  Administered 2015-11-03: 11:00:00 via INTRAVENOUS

## 2015-11-03 MED ORDER — INSULIN ASPART 100 UNIT/ML ~~LOC~~ SOLN
0.0000 [IU] | Freq: Three times a day (TID) | SUBCUTANEOUS | Status: DC
  Administered 2015-11-03: 2 [IU] via SUBCUTANEOUS
  Administered 2015-11-03: 3 [IU] via SUBCUTANEOUS
  Administered 2015-11-04: 5 [IU] via SUBCUTANEOUS
  Filled 2015-11-03: qty 1

## 2015-11-03 NOTE — ED Notes (Signed)
cbg 154, Insulin dc and  Iv fluids have been DC, per Dr. Delene Loll orders to dc drip after 2 hours receiving Lantus.

## 2015-11-03 NOTE — Progress Notes (Signed)
[facility] PROGRESS NOTE  Jared Murray [id number] DOB: February 18, 1976 DOA: 11/02/2015 PCP: Jared Marek, MD  Assessment/Plan: DKA:  -admitted with severe DKA with AG of 23 and bicarbonate of 6. -No obvious source of infection identified, missed a dose of lantus night before last -Ag has corrected, resume lantus and SSI -diet  SIRS -due to #1, clinically no evidence of infection and no symptoms to suggest it  Hyperkalemia: Potassium 6.1 with T-wave change. Secondary to DKA and AKi. -resolved  DM-II: Last A1c 12.4 on 08/23/15, poorly controlled.  -on Lantus 18 units daily and sliding scale insulin at home -resume lantus at 15units this am  Psoriasis: -continue home Temovate and Kenalog topically  GERD: -Protonix  Tobacco abuse: -Counseled, Nicotine patch  DVT ppx: SQ Lovenox  Code Status: Full Code Family Communication: none at bedside, d/w pt Disposition Plan: Home tomorrow     HPI/Subjective: Feels thirsty, nausea/vomiting better  Objective: Filed Vitals:   11/03/15 0945 11/03/15 1000  BP: 106/82 122/91  Pulse: 89 96  Temp:    Resp: 16 13   No intake or output data in the 24 hours ending 11/03/15 1037 There were no vitals filed for this visit.  Exam:   General:  AAOx3, no distress  Cardiovascular: S1S2/RRR  Respiratory: CTAB  Abdomen: soft, nT, ND, BS present  Musculoskeletal: no edema  Skin: areas of hypopigmentation noted  Data Reviewed: Basic Metabolic Panel:  Recent Labs Lab 11/02/15 2238 11/03/15 0055 11/03/15 0453 11/03/15 0759 11/03/15 0956  NA 140 142 140 140 141  K 5.1 4.7 4.6 4.0 3.8  CL 108 110 109 110 110  CO2 <7* 10* 17* 20* 20*  GLUCOSE 316* 243* 162* 93 106*  BUN 14 12 10 8 8   CREATININE 1.70* 1.39* 0.94 0.78 0.77  CALCIUM 8.5* 8.9 9.0 8.8* 8.6*  MG 2.0  --   --   --   --    Liver Function Tests: No results for input(s): AST, ALT, ALKPHOS, BILITOT, PROT, ALBUMIN in the last 168 hours.  Recent  Labs Lab 11/02/15 2238  LIPASE 86*   No results for input(s): AMMONIA in the last 168 hours. CBC:  Recent Labs Lab 11/02/15 2041 11/02/15 2047  WBC 16.3*  --   NEUTROABS 14.6*  --   HGB 14.9 17.0  HCT 42.9 50.0  MCV 93.9  --   PLT 220  --    Cardiac Enzymes: No results for input(s): CKTOTAL, CKMB, CKMBINDEX, TROPONINI in the last 168 hours. BNP (last 3 results) No results for input(s): BNP in the last 8760 hours.  ProBNP (last 3 results) No results for input(s): PROBNP in the last 8760 hours.  CBG:  Recent Labs Lab 11/03/15 0605 11/03/15 0703 11/03/15 0804 11/03/15 0903 11/03/15 1009  GLUCAP 127* 92 93 89 93    No results found for this or any previous visit (from the past 240 hour(s)).   Studies: No results found.  Scheduled Meds: . clobetasol cream  1 application Topical BID  . enoxaparin (LOVENOX) injection  40 mg Subcutaneous Daily  . insulin aspart  0-15 Units Subcutaneous TID WC  . insulin aspart  0-5 Units Subcutaneous QHS  . insulin glargine  15 Units Subcutaneous Daily  . nicotine  14 mg Transdermal Daily  . pantoprazole (PROTONIX) IV  40 mg Intravenous Q24H  . triamcinolone  1 application Topical BID   Continuous Infusions: . dextrose 5 % and 0.45% NaCl 150 mL (11/03/15 0747)  . sodium chloride  Antibiotics Given (last 72 hours)    None      Principal Problem:   DKA (diabetic ketoacidosis) ([place]) Active Problems:   Protein-calorie malnutrition, severe (HCC)   Psoriasis   Dehydration   GERD (gastroesophageal reflux disease)   Hyperkalemia   Type 1 diabetes mellitus (HCC)   SIRS (systemic inflammatory response syndrome) (HCC)   Tobacco use disorder   DKA (diabetic ketoacidoses) ([place])   Leukocytosis    Time spent: 58min    Jared Murray  [facility] Pager [phone]. If 7PM-7AM, please contact night-coverage at [email or web address], password [facility] 11/03/2015, 10:37 AM  LOS: 1 day

## 2015-11-03 NOTE — ED Notes (Signed)
Pt's CBG result was 127. Informed Mica - RN.

## 2015-11-03 NOTE — Progress Notes (Signed)
Hypoglycemic Event  CBG: 63  Treatment: 15 GM carbohydrate snack  Symptoms: None  Follow-up CBG: Time:2308 CBG Result:90  Possible Reasons for Event: Unknown  Comments/MD notified:On-call provider K Sophia made aware.     Judeth Horn, Merril Nagy

## 2015-11-03 NOTE — ED Notes (Signed)
Attempted to give Report,  Nurse unavailable

## 2015-11-03 NOTE — ED Notes (Signed)
Pt's lunch tray is at bedside.

## 2015-11-03 NOTE — ED Notes (Signed)
cbg 127 

## 2015-11-03 NOTE — Progress Notes (Signed)
Patient trasfered from ED to 5W07 via wheelchair; alert and oriented x 4; no complaints of pain; IV saline locked in LFA and fluids running in right wrist; skin intact. Orient patient to room and unit; gave patient care guide; instructed how to use the call bell and  fall risk precautions. Will continue to monitor the patient.

## 2015-11-03 NOTE — ED Notes (Signed)
Cbg is 168, restarted the Insulin drip and Gluco Stabilizer per Dr. Delene Loll orders. 1Amp of D50 was held due to pt.;'s Cbg is 168.   Pt. Just received his Lunch Tray

## 2015-11-03 NOTE — ED Notes (Signed)
cbg is 149 

## 2015-11-03 NOTE — ED Notes (Signed)
cbg is 170

## 2015-11-03 NOTE — ED Notes (Signed)
cbg is 184

## 2015-11-03 NOTE — ED Notes (Signed)
hospitalist paged in regards to insulin drip.

## 2015-11-03 NOTE — ED Notes (Signed)
Paged Dr. Broadus John to Cecille Rubin, [place].

## 2015-11-03 NOTE — ED Notes (Signed)
Carb modified lunch tray ordered at 11:18a. Spoke w/Crystal.

## 2015-11-03 NOTE — ED Notes (Addendum)
Spoke with Dr. Broadus John, reported that his last CBG 92, orders received to increase D5%-45% sodium chloride to 150 and collect another BMET.  Dr. Broadus John will be down to assess pt.  Anion Gap has not closed at this time.

## 2015-11-03 NOTE — ED Notes (Signed)
Spoke with Dr. Broadus John, reported to her that pt.s last C BG was 113, and Gluco Stabilizer order to stop the Insulin Drip. And we did.  Orders were received to give 1amp of D50  And then give Lantus insulin restart the Insulin drip and infuse for 2 hours after the lantus is Given.

## 2015-11-04 DIAGNOSIS — E0811 Diabetes mellitus due to underlying condition with ketoacidosis with coma: Secondary | ICD-10-CM

## 2015-11-04 DIAGNOSIS — E860 Dehydration: Secondary | ICD-10-CM

## 2015-11-04 DIAGNOSIS — K219 Gastro-esophageal reflux disease without esophagitis: Secondary | ICD-10-CM

## 2015-11-04 DIAGNOSIS — L409 Psoriasis, unspecified: Secondary | ICD-10-CM

## 2015-11-04 LAB — BASIC METABOLIC PANEL
ANION GAP: 9 (ref 5–15)
BUN: <5 mg/dL — ABNORMAL LOW (ref 6–20)
CHLORIDE: 104 mmol/L (ref 101–111)
CO2: 23 mmol/L (ref 22–32)
Calcium: 8.9 mg/dL (ref 8.9–10.3)
Creatinine, Ser: 0.61 mg/dL (ref 0.61–1.24)
GFR calc non Af Amer: >60 mL/min (ref >60)
GLUCOSE: 125 mg/dL — AB (ref 65–99)
POTASSIUM: 3.4 mmol/L — AB (ref 3.5–5.1)
Sodium: 136 mmol/L (ref 135–145)

## 2015-11-04 LAB — CBC
HEMATOCRIT: 35.5 % — AB (ref 39.0–52.0)
HEMOGLOBIN: 12.9 g/dL — AB (ref 13.0–17.0)
MCH: 31.9 pg (ref 26.0–34.0)
MCHC: 36.3 g/dL — AB (ref 30.0–36.0)
MCV: 87.9 fL (ref 78.0–100.0)
Platelets: 192 10*3/uL (ref 150–400)
RBC: 4.04 MIL/uL — ABNORMAL LOW (ref 4.22–5.81)
RDW: 13.2 % (ref 11.5–15.5)
WBC: 4.9 10*3/uL (ref 4.0–10.5)

## 2015-11-04 LAB — GLUCOSE, CAPILLARY
GLUCOSE-CAPILLARY: 83 mg/dL (ref 65–99)
GLUCOSE-CAPILLARY: 241 mg/dL — AB (ref 65–99)
Glucose-Capillary: 138 mg/dL — ABNORMAL HIGH (ref 65–99)

## 2015-11-04 MED ORDER — POTASSIUM CHLORIDE CRYS ER 20 MEQ PO TBCR
40.0000 meq | EXTENDED_RELEASE_TABLET | Freq: Once | ORAL | Status: AC
  Administered 2015-11-04: 40 meq via ORAL
  Filled 2015-11-04: qty 2

## 2015-11-04 NOTE — Progress Notes (Signed)
Patient was discharged home by MD order; discharged instructions review and give to patient with care notes; IV DIC;  patient will be escorted to the car by nurse tech via wheelchair.  

## 2015-11-04 NOTE — Discharge Summary (Signed)
Jared Murray, is a 40 y.o. male  DOB 1975-11-29  MRN [id number].  Admission date:  11/02/2015  Admitting Physician  Domenic Polite, MD  Discharge Date:  11/04/2015   Primary MD  Lorayne Marek, MD  Recommendations for primary care physician for things to follow:   Follow glycemic control closely, check BMP in 3-4 days   Admission Diagnosis  Diabetic ketoacidosis without coma associated with type 1 diabetes mellitus ([place]) [E10.10]   Discharge Diagnosis  Diabetic ketoacidosis without coma associated with type 1 diabetes mellitus ([place]) [E10.10]     Principal Problem:   DKA (diabetic ketoacidosis) ([place]) Active Problems:   Protein-calorie malnutrition, severe (HCC)   Psoriasis   Dehydration   GERD (gastroesophageal reflux disease)   Hyperkalemia   Type 1 diabetes mellitus (HCC)   SIRS (systemic inflammatory response syndrome) (HCC)   Tobacco use disorder   DKA (diabetic ketoacidoses) (HCC)   Leukocytosis      Past Medical History  Diagnosis Date  . Eczema   . Type 1 diabetes mellitus ([place])   . Psoriasis   . GERD (gastroesophageal reflux disease)   . DKA (diabetic ketoacidoses) [facility])     Past Surgical History  Procedure Laterality Date  . Finger surgery         HPI  from the history and physical done on the day of admission:   Jared Murray is a 40 y.o. male with PMH of type 1 diabetes, DKA, psoriasis, eczema, GERD, who presents with nausea and vomiting.  Patient reports that he started having nausea and vomiting since last night. He vomited more than 15 times without blood in the vomitus. He had acid reflux symptoms and mild abdominal pain which has resolved. He does not have fever, chills, diarrhea. No chest pain, cough or shortness of breath. No symptoms of UTI or unilateral weakness. He  has decreased oral intake and generalized weakness.   In ED, patient was found to have elevated anion gap of 23 with bicarbonate 6 and potassium 6, WBC 16.3, negative urinalysis for UTI, but positive for ketone, tachycardia, tachypnea. Patient is admitted to inpatient for further evaluation and treatment.  EKG: Independently reviewed. QTC 497, T-wave peaking in precordial leads, bifascicular AV block     Hospital Course:     DKAIn a patient with type 2 diabetes mellitus:   Due to noncompliance with his insulin regimen, patient admitted that he takes his insulin about 20 days a month his A1c last checked was 12.4, current A1c is pending. He was treated here with IV insulin drip along with IV fluids per DKA protocol, his gap has closed, his CBGs have been stable on home regimen, will be discharged home on his home regimen, counseled extensively on compliance, he is completely symptom free. Request PCP to monitor his glycemic control and pending A1c.  SIRS -due to #1, clinically no evidence of infection and no symptoms to suggest it.  Hypokalemia. Potassium has been replaced. Currently recheck BMP next visit.  Psoriasis: -continue home Temovate  and Kenalog topically  GERD: -Protonix  Tobacco abuse: -Counseled to quit smoking.     Discharge Condition: Stable  Follow UP  Follow-up Information    Follow up with [facility]. Schedule an appointment as soon as possible for a visit in 3 days.   Contact information:   [address] [place] [phone]       Consults obtained - none  Diet and Activity recommendation: See Discharge Instructions below  Discharge Instructions       Discharge Instructions    Discharge instructions    Complete by:  As directed   Follow with Primary MD Lorayne Marek, MD in 3-4 days   Get CBC, CMP, 2 view Chest X ray checked  by Primary MD next visit.    Activity: As tolerated  with Full fall precautions use walker/cane & assistance as needed   Disposition Home    Diet:   Heart Healthy Low Carb.  Accuchecks 4 times/day, Once in AM empty stomach and then before each meal. Log in all results and show them to your Prim.MD in 3 days. If any glucose reading is under 80 or above 300 call your Prim MD immidiately. Follow Low glucose instructions for glucose under 80 as instructed.   For Heart failure patients - Check your Weight same time everyday, if you gain over 2 pounds, or you develop in leg swelling, experience more shortness of breath or chest pain, call your Primary MD immediately. Follow Cardiac Low Salt Diet and 1.5 lit/day fluid restriction.   On your next visit with your primary care physician please Get Medicines reviewed and adjusted.   Please request your Prim.MD to go over all Hospital Tests and Procedure/Radiological results at the follow up, please get all Hospital records sent to your Prim MD by signing hospital release before you go home.   If you experience worsening of your admission symptoms, develop shortness of breath, life threatening emergency, suicidal or homicidal thoughts you must seek medical attention immediately by calling 911 or calling your MD immediately  if symptoms less severe.  You Must read complete instructions/literature along with all the possible adverse reactions/side effects for all the Medicines you take and that have been prescribed to you. Take any new Medicines after you have completely understood and accpet all the possible adverse reactions/side effects.   Do not drive, operating heavy machinery, perform activities at heights, swimming or participation in water activities or provide baby sitting services if your were admitted for syncope or siezures until you have seen by Primary MD or a Neurologist and advised to do so again.  Do not drive when taking Pain medications.    Do not take more than prescribed Pain,  Sleep and Anxiety Medications  Special Instructions: If you have smoked or chewed Tobacco  in the last 2 yrs please stop smoking, stop any regular Alcohol  and or any Recreational drug use.  Wear Seat belts while driving.   Please note  You were cared for by a hospitalist during your hospital stay. If you have any questions about your discharge medications or the care you received while you were in the hospital after you are discharged, you can call the unit and asked to speak with the hospitalist on call if the hospitalist that took care of you is not available. Once you are discharged, your primary care physician will handle any further medical issues. Please note that NO REFILLS for any discharge  medications will be authorized once you are discharged, as it is imperative that you return to your primary care physician (or establish a relationship with a primary care physician if you do not have one) for your aftercare needs so that they can reassess your need for medications and monitor your lab values.     Increase activity slowly    Complete by:  As directed              Discharge Medications       Medication List    TAKE these medications        ACCU-CHEK AVIVA device  Use as instructed     clobetasol cream 0.05 %  Commonly known as:  TEMOVATE  Apply 1 application topically 2 (two) times daily.     feeding supplement (GLUCERNA SHAKE) Liqd  Take 237 mLs by mouth 3 (three) times daily between meals.     glucose blood test strip  Accu-chek aviva. Check blood sugar 3 times before meals and at bedtime     insulin glargine 100 UNIT/ML injection  Commonly known as:  LANTUS  Inject 0.18 mLs (18 Units total) into the skin at bedtime.     Insulin Syringe-Needle U-100 29G X 1/2" 2 ML Misc  Commonly known as:  B-D INS SYRINGE 2CC/29GX1/2"  Check Blood sugar TID and QHS     nicotine 14 mg/24hr patch  Commonly known as:  NICODERM CQ - dosed in mg/24 hours  Place 1 patch (14 mg  total) onto the skin daily.     NOVOLOG 100 UNIT/ML injection  Generic drug:  insulin aspart  5 units subcutaneous before breakfast, 7 units before lunch and 7 units before dinner.     pantoprazole 40 MG tablet  Commonly known as:  PROTONIX  Take 1 tablet (40 mg total) by mouth daily.     traMADol 50 MG tablet  Commonly known as:  ULTRAM  Take 2 tablets (100 mg total) by mouth every 12 (twelve) hours as needed.     triamcinolone 0.025 % cream  Commonly known as:  KENALOG  Apply 1 application topically 2 (two) times daily.        Major procedures and Radiology Reports - PLEASE review detailed and final reports for all details, in brief -       No results found.  Micro Results      Recent Results (from the past 240 hour(s))  Culture, blood (routine x 2)     Status: None (Preliminary result)   Collection Time: 11/02/15 10:19 PM  Result Value Ref Range Status   Specimen Description BLOOD LEFT FOREARM  Final   Special Requests IN PEDIATRIC BOTTLE 5CC  Final   Culture NO GROWTH < 24 HOURS  Final   Report Status PENDING  Incomplete  MRSA PCR Screening     Status: None   Collection Time: 11/03/15  3:32 PM  Result Value Ref Range Status   MRSA by PCR NEGATIVE NEGATIVE Final    Comment:        The GeneXpert MRSA Assay (FDA approved for NASAL specimens only), is one component of a comprehensive MRSA colonization surveillance program. It is not intended to diagnose MRSA infection nor to guide or monitor treatment for MRSA infections.        Today   Subjective    Jared Murray today has no headache,no chest abdominal pain,no new weakness tingling or numbness, feels much better wants to go home today.  Objective   Blood pressure 122/76, pulse 89, temperature 98.4 F (36.9 C), temperature source Oral, resp. rate 18, height 5\' 10"  (1.778 m), weight 56 kg (123 lb 7.3 oz), SpO2 99 %.   Intake/Output Summary (Last 24 hours) at 11/04/15 1217 Last data filed  at 11/04/15 1056  Gross per 24 hour  Intake    560 ml  Output    950 ml  Net   -390 ml    Exam Awake Alert, Oriented x 3, No new F.N deficits, Normal affect [place].AT,PERRAL Supple Neck,No JVD, No cervical lymphadenopathy appriciated.  Symmetrical Chest wall movement, Good air movement bilaterally, CTAB RRR,No Gallops,Rubs or new Murmurs, No Parasternal Heave +ve B.Sounds, Abd Soft, Non tender, No organomegaly appriciated, No rebound -guarding or rigidity. No Cyanosis, Clubbing or edema, No new Rash or bruise   Data Review   CBC w Diff: Lab Results  Component Value Date   WBC 4.9 11/04/2015   HGB 12.9* 11/04/2015   HCT 35.5* 11/04/2015   PLT 192 11/04/2015   LYMPHOPCT 6 11/02/2015   MONOPCT 4 11/02/2015   EOSPCT 0 11/02/2015   BASOPCT 0 11/02/2015    CMP: Lab Results  Component Value Date   NA 136 11/04/2015   K 3.4* 11/04/2015   CL 104 11/04/2015   CO2 23 11/04/2015   BUN <5* 11/04/2015   CREATININE 0.61 11/04/2015   CREATININE 0.60 07/06/2015   PROT 7.1 05/14/2015   ALBUMIN 3.9 05/14/2015   BILITOT 2.4* 05/14/2015   ALKPHOS 158* 05/14/2015   AST 40 05/14/2015   ALT 24 05/14/2015  . Lab Results  Component Value Date   HGBA1C 12.4* 08/23/2015   CBG (last 3)   Recent Labs  11/03/15 2308 11/04/15 0351 11/04/15 0735  GLUCAP 90 138* 83      Total Time in preparing paper work, data evaluation and todays exam - 35 minutes  Thurnell Lose M.D on 11/04/2015 at 12:17 PM  [facility]   Office  [phone]

## 2015-11-04 NOTE — Discharge Instructions (Signed)
Follow with Primary MD Lorayne Marek, MD in 3-4 days   Get CBC, CMP, 2 view Chest X ray checked  by Primary MD next visit.    Activity: As tolerated with Full fall precautions use walker/cane & assistance as needed   Disposition Home    Diet:   Heart Healthy Low Carb.  Accuchecks 4 times/day, Once in AM empty stomach and then before each meal. Log in all results and show them to your Prim.MD in 3 days. If any glucose reading is under 80 or above 300 call your Prim MD immidiately. Follow Low glucose instructions for glucose under 80 as instructed.   For Heart failure patients - Check your Weight same time everyday, if you gain over 2 pounds, or you develop in leg swelling, experience more shortness of breath or chest pain, call your Primary MD immediately. Follow Cardiac Low Salt Diet and 1.5 lit/day fluid restriction.   On your next visit with your primary care physician please Get Medicines reviewed and adjusted.   Please request your Prim.MD to go over all Hospital Tests and Procedure/Radiological results at the follow up, please get all Hospital records sent to your Prim MD by signing hospital release before you go home.   If you experience worsening of your admission symptoms, develop shortness of breath, life threatening emergency, suicidal or homicidal thoughts you must seek medical attention immediately by calling 911 or calling your MD immediately  if symptoms less severe.  You Must read complete instructions/literature along with all the possible adverse reactions/side effects for all the Medicines you take and that have been prescribed to you. Take any new Medicines after you have completely understood and accpet all the possible adverse reactions/side effects.   Do not drive, operating heavy machinery, perform activities at heights, swimming or participation in water activities or provide baby sitting services if your were admitted for syncope or siezures until you have  seen by Primary MD or a Neurologist and advised to do so again.  Do not drive when taking Pain medications.    Do not take more than prescribed Pain, Sleep and Anxiety Medications  Special Instructions: If you have smoked or chewed Tobacco  in the last 2 yrs please stop smoking, stop any regular Alcohol  and or any Recreational drug use.  Wear Seat belts while driving.   Please note  You were cared for by a hospitalist during your hospital stay. If you have any questions about your discharge medications or the care you received while you were in the hospital after you are discharged, you can call the unit and asked to speak with the hospitalist on call if the hospitalist that took care of you is not available. Once you are discharged, your primary care physician will handle any further medical issues. Please note that NO REFILLS for any discharge medications will be authorized once you are discharged, as it is imperative that you return to your primary care physician (or establish a relationship with a primary care physician if you do not have one) for your aftercare needs so that they can reassess your need for medications and monitor your lab values.

## 2015-11-05 LAB — HEMOGLOBIN A1C
HEMOGLOBIN A1C: 13.5 % — AB (ref 4.8–5.6)
MEAN PLASMA GLUCOSE: 341 mg/dL

## 2015-11-07 LAB — CULTURE, BLOOD (ROUTINE X 2): Culture: NO GROWTH

## 2015-11-08 LAB — CULTURE, BLOOD (ROUTINE X 2): CULTURE: NO GROWTH

## 2015-11-21 ENCOUNTER — Ambulatory Visit: Payer: Medicaid Other | Attending: Family Medicine | Admitting: Family Medicine

## 2015-11-21 ENCOUNTER — Ambulatory Visit (HOSPITAL_BASED_OUTPATIENT_CLINIC_OR_DEPARTMENT_OTHER): Payer: Medicaid Other | Admitting: Clinical

## 2015-11-21 ENCOUNTER — Encounter: Payer: Self-pay | Admitting: Family Medicine

## 2015-11-21 VITALS — BP 124/86 | HR 110 | Temp 98.4°F | Resp 15 | Ht 70.0 in | Wt 132.0 lb

## 2015-11-21 DIAGNOSIS — Z794 Long term (current) use of insulin: Secondary | ICD-10-CM | POA: Insufficient documentation

## 2015-11-21 DIAGNOSIS — Z79899 Other long term (current) drug therapy: Secondary | ICD-10-CM | POA: Diagnosis not present

## 2015-11-21 DIAGNOSIS — Z91199 Patient's noncompliance with other medical treatment and regimen due to unspecified reason: Secondary | ICD-10-CM

## 2015-11-21 DIAGNOSIS — E1165 Type 2 diabetes mellitus with hyperglycemia: Secondary | ICD-10-CM | POA: Diagnosis not present

## 2015-11-21 DIAGNOSIS — E1029 Type 1 diabetes mellitus with other diabetic kidney complication: Secondary | ICD-10-CM | POA: Diagnosis not present

## 2015-11-21 DIAGNOSIS — F411 Generalized anxiety disorder: Secondary | ICD-10-CM

## 2015-11-21 DIAGNOSIS — E1065 Type 1 diabetes mellitus with hyperglycemia: Secondary | ICD-10-CM | POA: Diagnosis not present

## 2015-11-21 DIAGNOSIS — Z9119 Patient's noncompliance with other medical treatment and regimen: Secondary | ICD-10-CM | POA: Diagnosis not present

## 2015-11-21 LAB — GLUCOSE, POCT (MANUAL RESULT ENTRY): POC Glucose: 228 mg/dl — AB (ref 70–99)

## 2015-11-21 MED ORDER — NOVOLOG 100 UNIT/ML ~~LOC~~ SOLN: SUBCUTANEOUS | Status: DC

## 2015-11-21 MED ORDER — INSULIN GLARGINE 100 UNIT/ML ~~LOC~~ SOLN: 18.0000 [IU] | Freq: Every day | SUBCUTANEOUS | Status: DC

## 2015-11-21 MED ORDER — ACCU-CHEK SOFTCLIX LANCET DEV MISC: Status: DC

## 2015-11-21 NOTE — Patient Instructions (Signed)
Diabetes Mellitus and Food It is important for you to manage your blood sugar (glucose) level. Your blood glucose level can be greatly affected by what you eat. Eating healthier foods in the appropriate amounts throughout the day at about the same time each day will help you control your blood glucose level. It can also help slow or prevent worsening of your diabetes mellitus. Healthy eating may even help you improve the level of your blood pressure and reach or maintain a healthy weight.  General recommendations for healthful eating and cooking habits include:  Eating meals and snacks regularly. Avoid going long periods of time without eating to lose weight.  Eating a diet that consists mainly of plant-based foods, such as fruits, vegetables, nuts, legumes, and whole grains.  Using low-heat cooking methods, such as baking, instead of high-heat cooking methods, such as deep frying. Work with your dietitian to make sure you understand how to use the Nutrition Facts information on food labels. HOW CAN FOOD AFFECT ME? Carbohydrates Carbohydrates affect your blood glucose level more than any other type of food. Your dietitian will help you determine how many carbohydrates to eat at each meal and teach you how to count carbohydrates. Counting carbohydrates is important to keep your blood glucose at a healthy level, especially if you are using insulin or taking certain medicines for diabetes mellitus. Alcohol Alcohol can cause sudden decreases in blood glucose (hypoglycemia), especially if you use insulin or take certain medicines for diabetes mellitus. Hypoglycemia can be a life-threatening condition. Symptoms of hypoglycemia (sleepiness, dizziness, and disorientation) are similar to symptoms of having too much alcohol.  If your health care provider has given you approval to drink alcohol, do so in moderation and use the following guidelines:  Women should not have more than one drink per day, and men  should not have more than two drinks per day. One drink is equal to:  12 oz of beer.  5 oz of wine.  1 oz of hard liquor.  Do not drink on an empty stomach.  Keep yourself hydrated. Have water, diet soda, or unsweetened iced tea.  Regular soda, juice, and other mixers might contain a lot of carbohydrates and should be counted. WHAT FOODS ARE NOT RECOMMENDED? As you make food choices, it is important to remember that all foods are not the same. Some foods have fewer nutrients per serving than other foods, even though they might have the same number of calories or carbohydrates. It is difficult to get your body what it needs when you eat foods with fewer nutrients. Examples of foods that you should avoid that are high in calories and carbohydrates but low in nutrients include:  Trans fats (most processed foods list trans fats on the Nutrition Facts label).  Regular soda.  Juice.  Candy.  Sweets, such as cake, pie, doughnuts, and cookies.  Fried foods. WHAT FOODS CAN I EAT? Eat nutrient-rich foods, which will nourish your body and keep you healthy. The food you should eat also will depend on several factors, including:  The calories you need.  The medicines you take.  Your weight.  Your blood glucose level.  Your blood pressure level.  Your cholesterol level. You should eat a variety of foods, including:  Protein.  Lean cuts of meat.  Proteins low in saturated fats, such as fish, egg whites, and beans. Avoid processed meats.  Fruits and vegetables.  Fruits and vegetables that may help control blood glucose levels, such as apples, mangoes, and   yams.  Dairy products.  Choose fat-free or low-fat dairy products, such as milk, yogurt, and cheese.  Grains, bread, pasta, and rice.  Choose whole grain products, such as multigrain bread, whole oats, and brown rice. These foods may help control blood pressure.  Fats.  Foods containing healthful fats, such as nuts,  avocado, olive oil, canola oil, and fish. DOES EVERYONE WITH DIABETES MELLITUS HAVE THE SAME MEAL PLAN? Because every person with diabetes mellitus is different, there is not one meal plan that works for everyone. It is very important that you meet with a dietitian who will help you create a meal plan that is just right for you.   This information is not intended to replace advice given to you by your health care provider. Make sure you discuss any questions you have with your health care provider.   Document Released: 05/01/2005 Document Revised: 08/25/2014 Document Reviewed: 07/01/2013 Elsevier Interactive Patient Education 2016 Elsevier Inc.  

## 2015-11-21 NOTE — Progress Notes (Signed)
ASSESSMENT: Pt currently experiencing symptoms of anxiety. Pt needs to f/u with PCP and [facility]; would benefit from psychoeducation and brief therapeutic interventions regarding coping with symptoms of anxiety.  Stage of Change: contemplative  PLAN: 1. F/U with behavioral health consultant in two weeks 2. Psychiatric Medications: none. 3. Behavioral recommendation(s):   -Consider "worry hour", as discussed in office visit -Consider reading educational material regarding coping with symptoms of anxiety  SUBJECTIVE: Pt. referred by Dr Jarold Song for symptoms of anxiety:  Pt. reports the following symptoms/concerns: Pt states that he is feeling anxious, that he constantly worries about many things in life; his brother says he has anxiety like him, and his wife says he has a difficult time relaxing. Pt would like to try techniques to reduce anxiety without resorting to any additional medicine.  Duration of problem: (Noticed in)past month Severity: mild  OBJECTIVE: Orientation & Cognition: Oriented x3. Thought processes normal and appropriate to situation. Mood: appropriate. Affect: appropriate Appearance: appropriate Risk of harm to self or others: no known risk of harm to self or others Substance use: none Assessments administered: PHQ9: 0/ GAD7: 6  Diagnosis: Anxiety CPT Code: F41.1 -------------------------------------------- Other(s) present in the room: none  Time spent with patient in exam room: 16 minutes, 4:20-4:36pm   Depression screen [facility] 2/9 11/21/2015 09/13/2015 08/30/2015 07/23/2015 07/23/2015  Decreased Interest 0 0 0 1 1  Down, Depressed, Hopeless 0 0 0 0 0  PHQ - 2 Score 0 0 0 1 1  Altered sleeping - - - - -  Tired, decreased energy - - - - -  Feeling bad or failure about yourself  - - - - -  Trouble concentrating - - - - -  Moving slowly or fidgety/restless - - - - -  Suicidal thoughts - - - - -  PHQ-9 Score - - - - -    GAD 7 : Generalized Anxiety Score 11/21/2015 07/23/2015   Nervous, Anxious, on Edge 1 0  Control/stop worrying 1 0  Worry too much - different things 1 0  Trouble relaxing 1 1  Restless 0 0  Easily annoyed or irritable 1 0  Afraid - awful might happen 1 0  Total GAD 7 Score 6 1

## 2015-11-21 NOTE — Progress Notes (Signed)
Admits to being non-compliant with DM insulin States he is under a lot of stress with his family Feels he may have anxiety Needs refills on his insulin

## 2015-11-21 NOTE — Progress Notes (Signed)
Subjective:  Patient ID: Jared Murray, male    DOB: Dec 28, 1975  Age: 40 y.o. MRN: [id number]  CC: Follow-up and Diabetes   HPI Jared Murray is a 40 with a history of uncontrolled type 1 diabetes mellitus (A1c 13.5), noncompliance, multiple hospital admissions for DKA, avascular necrosis of both hips, psoriasis who is here for follow-up from hospitalization from 11/02/15 through 11/04/15 where he was managed for DKA.  He informs me as he always does at follow up appointments that he is ready to do right and take his medications.  He complains of generalized anxiety and states he sometimes forgets to take his medications when he is stressed and that is what happened just before his last hospitalization.  He is not here with his blood sugar log; [facility] had arranged for him to meet with a Nutritionist which he never did due to his wife's schedule. He also has pain in his hips whenever he tries to exercise and this distresses him- we have discussed Ortho referral for management of his avascular necrosis of the hips but he is not ready at this time.   Outpatient Prescriptions Prior to Visit  Medication Sig Dispense Refill  . Blood Glucose Monitoring Suppl (ACCU-CHEK AVIVA) device Use as instructed 1 each 0  . clobetasol cream (TEMOVATE) [id number] % Apply 1 application topically 2 (two) times daily. 30 g 2  . feeding supplement, GLUCERNA SHAKE, (GLUCERNA SHAKE) LIQD Take 237 mLs by mouth 3 (three) times daily between meals. 30 Can 1  . glucose blood test strip Accu-chek aviva. Check blood sugar 3 times before meals and at bedtime 120 each 5  . Insulin Syringe-Needle U-100 (B-D INS SYRINGE 2CC/29GX1/2") 29G X 1/2" 2 ML MISC Check Blood sugar TID and QHS 100 each 12  . pantoprazole (PROTONIX) 40 MG tablet Take 1 tablet (40 mg total) by mouth daily. 30 tablet 2  . triamcinolone (KENALOG) 0.025 % cream Apply 1 application topically 2 (two) times daily.    . insulin glargine (LANTUS) 100 UNIT/ML  injection Inject 0.18 mLs (18 Units total) into the skin at bedtime. 10 mL 11  . NOVOLOG 100 UNIT/ML injection 5 units subcutaneous before breakfast, 7 units before lunch and 7 units before dinner. 2 vial 12  . nicotine (NICODERM CQ - DOSED IN MG/24 HOURS) 14 mg/24hr patch Place 1 patch (14 mg total) onto the skin daily. (Patient not taking: Reported on 11/02/2015) 28 patch 1  . traMADol (ULTRAM) 50 MG tablet Take 2 tablets (100 mg total) by mouth every 12 (twelve) hours as needed. (Patient not taking: Reported on 11/21/2015) 60 tablet 1   No facility-administered medications prior to visit.    ROS Review of Systems  Constitutional: Negative for activity change and appetite change.  HENT: Negative for sinus pressure and sore throat.   Eyes: Negative for visual disturbance.  Respiratory: Negative for cough, chest tightness and shortness of breath.   Cardiovascular: Negative for chest pain and leg swelling.  Gastrointestinal: Negative for abdominal pain, diarrhea, constipation and abdominal distention.  Endocrine: Negative.   Genitourinary: Negative for dysuria.  Musculoskeletal: Negative for myalgias and joint swelling.       Bilateral hip pain  Skin: Positive for rash (Psoriatic lesions on body.).  Allergic/Immunologic: Negative.   Neurological: Negative for weakness, light-headedness and numbness.  Psychiatric/Behavioral: Negative for suicidal ideas and dysphoric mood.    Objective:  BP 124/86 mmHg  Pulse 110  Temp(Src) 98.4 F (36.9 C)  Resp 15  Ht  5\' 10"  (1.778 m)  Wt 132 lb (59.875 kg)  BMI 18.94 kg/m2  SpO2 97%  BP/Weight 11/21/2015 11/04/2015 [id number]  Systolic BP A999333 123456 -  Diastolic BP 86 77 -  Wt. (Lbs) 132 - 123.46  BMI 18.94 - 17.71      Physical Exam  Constitutional: He is oriented to person, place, and time. He appears well-developed and well-nourished.  Cardiovascular: Normal heart sounds and intact distal pulses.  Tachycardia present.   No murmur  heard. Pulmonary/Chest: Effort normal and breath sounds normal. He has no wheezes. He has no rales. He exhibits no tenderness.  Abdominal: Soft. Bowel sounds are normal. He exhibits no distension and no mass. There is no tenderness.  Musculoskeletal: Normal range of motion.  Neurological: He is alert and oriented to person, place, and time.  Skin:  Hypopigmented patches on dorsum of both hands, psoriatic lesions diffusely distributed on body surface.  Psychiatric:  Anxious   Lab Results  Component Value Date   HGBA1C 13.5* 11/03/2015    CMP Latest Ref Rng 11/04/2015 11/03/2015 11/03/2015  Glucose 65 - 99 mg/dL 125(H) 106(H) 93  BUN 6 - 20 mg/dL <5(L) 8 8  Creatinine 0.61 - 1.24 mg/dL 0.61 0.77 0.78  Sodium 135 - 145 mmol/L 136 141 140  Potassium 3.5 - 5.1 mmol/L 3.4(L) 3.8 4.0  Chloride 101 - 111 mmol/L 104 110 110  CO2 22 - 32 mmol/L 23 20(L) 20(L)  Calcium 8.9 - 10.3 mg/dL 8.9 8.6(L) 8.8(L)      Assessment & Plan:   1. Type 1 diabetes mellitus with hyperglycemia (HCC) Uncontrolled with Hba1c of 13.5 No regimen changes as poor control is due to non compliance Will review blood sugar log at next visit I have discussed the use of alarms and reminders with regards to aiding compliance BMET at next visit - Glucose (CBG)  2. Generalized anxiety disorder Refuses initiation of medications- would love to place him on Buspar He would like to discuss with his wife first LCSW called in for counseling session  3. Non compliance with medical treatment Discussed implications of non compliance , complications of Diabetes and he will be working on this  4. 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  Dispense: 10 mL; Refill: 5 - NOVOLOG 100 UNIT/ML injection; 5 units subcutaneous before breakfast, 7 units before lunch and 7 units before dinner.  Dispense: 2  vial; Refill: 6 - Lancet Devices (ACCU-CHEK SOFTCLIX) lancets; Use as instructed three times daily.  Dispense: 1 each; Refill: 5   Meds ordered this encounter  Medications  . insulin glargine (LANTUS) 100 UNIT/ML injection    Sig: Inject 0.18 mLs (18 Units total) into the skin at bedtime.    Dispense:  10 mL    Refill:  5  . NOVOLOG 100 UNIT/ML injection    Sig: 5 units subcutaneous before breakfast, 7 units before lunch and 7 units before dinner.    Dispense:  2 vial    Refill:  6    Please dispense as vial. Thanks  . Lancet Devices (ACCU-CHEK SOFTCLIX) lancets    Sig: Use as instructed three times daily.    Dispense:  1 each    Refill:  5    Follow-up: Return in about 2 weeks (around 12/05/2015) for Follow-up of diabetes mellitus.   [name] MD

## 2015-11-27 ENCOUNTER — Telehealth: Payer: Self-pay | Admitting: Family Medicine

## 2015-11-27 NOTE — Telephone Encounter (Signed)
Patient called requesting a refill for test strips and lancets. Please follow up.

## 2015-11-29 ENCOUNTER — Telehealth: Payer: Self-pay | Admitting: Family Medicine

## 2015-11-29 NOTE — Telephone Encounter (Signed)
This Case Manager placed return call to patient to discuss needed referral. Patient indicated he needs to be referred to an Orthopedic Surgeon for bilateral avascular necrosis of hips. Will route to Dr. Jarold Song for consideration.

## 2015-11-29 NOTE — Telephone Encounter (Signed)
Patient called wanting to be referred to an orthopedic. Please follow up.

## 2015-12-01 ENCOUNTER — Other Ambulatory Visit: Payer: Self-pay | Admitting: Family Medicine

## 2015-12-01 DIAGNOSIS — M87059 Idiopathic aseptic necrosis of unspecified femur: Secondary | ICD-10-CM

## 2015-12-07 ENCOUNTER — Encounter: Payer: Self-pay | Admitting: Family Medicine

## 2015-12-07 ENCOUNTER — Ambulatory Visit: Payer: Medicaid Other | Attending: Family Medicine | Admitting: Family Medicine

## 2015-12-07 ENCOUNTER — Encounter: Payer: Self-pay | Admitting: Clinical

## 2015-12-07 DIAGNOSIS — Z794 Long term (current) use of insulin: Secondary | ICD-10-CM | POA: Insufficient documentation

## 2015-12-07 DIAGNOSIS — E1029 Type 1 diabetes mellitus with other diabetic kidney complication: Secondary | ICD-10-CM | POA: Diagnosis not present

## 2015-12-07 DIAGNOSIS — Z79899 Other long term (current) drug therapy: Secondary | ICD-10-CM | POA: Diagnosis not present

## 2015-12-07 DIAGNOSIS — M879 Osteonecrosis, unspecified: Secondary | ICD-10-CM | POA: Insufficient documentation

## 2015-12-07 DIAGNOSIS — F411 Generalized anxiety disorder: Secondary | ICD-10-CM | POA: Diagnosis not present

## 2015-12-07 DIAGNOSIS — E1021 Type 1 diabetes mellitus with diabetic nephropathy: Secondary | ICD-10-CM | POA: Diagnosis present

## 2015-12-07 DIAGNOSIS — M87059 Idiopathic aseptic necrosis of unspecified femur: Secondary | ICD-10-CM

## 2015-12-07 DIAGNOSIS — J302 Other seasonal allergic rhinitis: Secondary | ICD-10-CM | POA: Diagnosis not present

## 2015-12-07 DIAGNOSIS — Z9119 Patient's noncompliance with other medical treatment and regimen: Secondary | ICD-10-CM | POA: Insufficient documentation

## 2015-12-07 LAB — GLUCOSE, POCT (MANUAL RESULT ENTRY): POC Glucose: 394 mg/dL — AB (ref 70–99)

## 2015-12-07 MED ORDER — ACETAMINOPHEN-CODEINE #3 300-30 MG PO TABS: 1.0000 | ORAL_TABLET | Freq: Three times a day (TID) | ORAL | Status: DC | PRN

## 2015-12-07 MED ORDER — BUSPIRONE HCL 10 MG PO TABS: 10.0000 mg | ORAL_TABLET | Freq: Three times a day (TID) | ORAL | Status: DC

## 2015-12-07 MED ORDER — OLOPATADINE HCL 0.1 % OP SOLN: 1.0000 [drp] | Freq: Two times a day (BID) | OPHTHALMIC | Status: DC

## 2015-12-07 MED ORDER — INSULIN GLARGINE 100 UNIT/ML ~~LOC~~ SOLN: 25.0000 [IU] | Freq: Every day | SUBCUTANEOUS | Status: DC

## 2015-12-07 NOTE — Patient Instructions (Signed)
Diabetes Mellitus and Food It is important for you to manage your blood sugar (glucose) level. Your blood glucose level can be greatly affected by what you eat. Eating healthier foods in the appropriate amounts throughout the day at about the same time each day will help you control your blood glucose level. It can also help slow or prevent worsening of your diabetes mellitus. Healthy eating may even help you improve the level of your blood pressure and reach or maintain a healthy weight.  General recommendations for healthful eating and cooking habits include:  Eating meals and snacks regularly. Avoid going long periods of time without eating to lose weight.  Eating a diet that consists mainly of plant-based foods, such as fruits, vegetables, nuts, legumes, and whole grains.  Using low-heat cooking methods, such as baking, instead of high-heat cooking methods, such as deep frying. Work with your dietitian to make sure you understand how to use the Nutrition Facts information on food labels. HOW CAN FOOD AFFECT ME? Carbohydrates Carbohydrates affect your blood glucose level more than any other type of food. Your dietitian will help you determine how many carbohydrates to eat at each meal and teach you how to count carbohydrates. Counting carbohydrates is important to keep your blood glucose at a healthy level, especially if you are using insulin or taking certain medicines for diabetes mellitus. Alcohol Alcohol can cause sudden decreases in blood glucose (hypoglycemia), especially if you use insulin or take certain medicines for diabetes mellitus. Hypoglycemia can be a life-threatening condition. Symptoms of hypoglycemia (sleepiness, dizziness, and disorientation) are similar to symptoms of having too much alcohol.  If your health care provider has given you approval to drink alcohol, do so in moderation and use the following guidelines:  Women should not have more than one drink per day, and men  should not have more than two drinks per day. One drink is equal to:  12 oz of beer.  5 oz of wine.  1 oz of hard liquor.  Do not drink on an empty stomach.  Keep yourself hydrated. Have water, diet soda, or unsweetened iced tea.  Regular soda, juice, and other mixers might contain a lot of carbohydrates and should be counted. WHAT FOODS ARE NOT RECOMMENDED? As you make food choices, it is important to remember that all foods are not the same. Some foods have fewer nutrients per serving than other foods, even though they might have the same number of calories or carbohydrates. It is difficult to get your body what it needs when you eat foods with fewer nutrients. Examples of foods that you should avoid that are high in calories and carbohydrates but low in nutrients include:  Trans fats (most processed foods list trans fats on the Nutrition Facts label).  Regular soda.  Juice.  Candy.  Sweets, such as cake, pie, doughnuts, and cookies.  Fried foods. WHAT FOODS CAN I EAT? Eat nutrient-rich foods, which will nourish your body and keep you healthy. The food you should eat also will depend on several factors, including:  The calories you need.  The medicines you take.  Your weight.  Your blood glucose level.  Your blood pressure level.  Your cholesterol level. You should eat a variety of foods, including:  Protein.  Lean cuts of meat.  Proteins low in saturated fats, such as fish, egg whites, and beans. Avoid processed meats.  Fruits and vegetables.  Fruits and vegetables that may help control blood glucose levels, such as apples, mangoes, and   yams.  Dairy products.  Choose fat-free or low-fat dairy products, such as milk, yogurt, and cheese.  Grains, bread, pasta, and rice.  Choose whole grain products, such as multigrain bread, whole oats, and brown rice. These foods may help control blood pressure.  Fats.  Foods containing healthful fats, such as nuts,  avocado, olive oil, canola oil, and fish. DOES EVERYONE WITH DIABETES MELLITUS HAVE THE SAME MEAL PLAN? Because every person with diabetes mellitus is different, there is not one meal plan that works for everyone. It is very important that you meet with a dietitian who will help you create a meal plan that is just right for you.   This information is not intended to replace advice given to you by your health care provider. Make sure you discuss any questions you have with your health care provider.   Document Released: 05/01/2005 Document Revised: 08/25/2014 Document Reviewed: 07/01/2013 Elsevier Interactive Patient Education 2016 Elsevier Inc.  

## 2015-12-07 NOTE — Progress Notes (Signed)
Subjective:  Patient ID: Jared Murray, male    DOB: August 21, 1975  Age: 40 y.o. MRN: [id number]  CC: Referral; Follow-up; and Diabetes   HPI Jared Murray is a 69 with a history of uncontrolled type 1 diabetes mellitus (A1c 13.5), noncompliance, multiple hospital admissions for DKA, avascular necrosis of both hips, psoriasis here for follow-up visit. Blood sugar is 394 and he informs me breakfast was less than an hour ago and he took 3 units of insulin as per his sliding scale prior to breakfast as his fasting blood sugar was in the 200s however he endorses some random blood sugars which have been elevated in the 300-400 range. Denies hypoglycemia.  Had called the office requesting referral to orthopedics for bilateral avascular necrosis of the hips and referral was made to [facility]. He takes tramadol for pain but complains of sedation and worsening of reflux symptoms for tramadol.  Also complains of itchy eyes for the last 1 month but denies rhinorrhea, nasal congestion or sore throat.  Outpatient Prescriptions Prior to Visit  Medication Sig Dispense Refill  . Blood Glucose Monitoring Suppl (ACCU-CHEK AVIVA) device Use as instructed 1 each 0  . clobetasol cream (TEMOVATE) [id number] % Apply 1 application topically 2 (two) times daily. 30 g 2  . feeding supplement, GLUCERNA SHAKE, (GLUCERNA SHAKE) LIQD Take 237 mLs by mouth 3 (three) times daily between meals. 30 Can 1  . glucose blood test strip Accu-chek aviva. Check blood sugar 3 times before meals and at bedtime 120 each 5  . Insulin Syringe-Needle U-100 (B-D INS SYRINGE 2CC/29GX1/2") 29G X 1/2" 2 ML MISC Check Blood sugar TID and QHS 100 each 12  . Lancet Devices (ACCU-CHEK SOFTCLIX) lancets Use as instructed three times daily. 1 each 5  . nicotine (NICODERM CQ - DOSED IN MG/24 HOURS) 14 mg/24hr patch Place 1 patch (14 mg total) onto the skin daily. 28 patch 1  . NOVOLOG 100 UNIT/ML injection 5 units subcutaneous before  breakfast, 7 units before lunch and 7 units before dinner. 2 vial 6  . pantoprazole (PROTONIX) 40 MG tablet Take 1 tablet (40 mg total) by mouth daily. 30 tablet 2  . triamcinolone (KENALOG) 0.025 % cream Apply 1 application topically 2 (two) times daily.    . insulin glargine (LANTUS) 100 UNIT/ML injection Inject 0.18 mLs (18 Units total) into the skin at bedtime. 10 mL 5  . traMADol (ULTRAM) 50 MG tablet Take 2 tablets (100 mg total) by mouth every 12 (twelve) hours as needed. (Patient not taking: Reported on 12/07/2015) 60 tablet 1   No facility-administered medications prior to visit.    ROS Review of Systems Constitutional: Negative for activity change and appetite change.  HENT: Negative for sinus pressure and sore throat.   Eyes: Negative for visual disturbance.  Respiratory: Negative for cough, chest tightness and shortness of breath.   Cardiovascular: Negative for chest pain and leg swelling.  Gastrointestinal: Negative for abdominal pain, diarrhea, constipation and abdominal distention.  Endocrine: Negative.   Genitourinary: Negative for dysuria.  Musculoskeletal: Negative for myalgias and joint swelling.       Bilateral hip pain  Skin: Positive for rash (Psoriatic lesions on body.).  Allergic/Immunologic: Negative.   Neurological: Negative for weakness, light-headedness and numbness.  Psychiatric/Behavioral: Negative for suicidal ideas and dysphoric mood.   Objective:  BP 110/75 mmHg  Pulse 102  Temp(Src) 98.1 F (36.7 C) (Oral)  Resp 18  Ht 5\' 10"  (1.778 m)  Wt 130 lb 9.6  oz (59.24 kg)  BMI 18.74 kg/m2  SpO2 98%  BP/Weight 12/07/2015 11/21/2015 123456  Systolic BP A999333 A999333 123456  Diastolic BP 75 86 77  Wt. (Lbs) 130.6 132 -  BMI 18.74 18.94 -      Physical Exam Constitutional: He is oriented to person, place, and time. He appears well-developed and well-nourished.  Cardiovascular: Normal heart sounds and intact distal pulses.  Tachycardia present.   No murmur  heard. Pulmonary/Chest: Effort normal and breath sounds normal. He has no wheezes. He has no rales. He exhibits no tenderness.  Abdominal: Soft. Bowel sounds are normal. He exhibits no distension and no mass. There is no tenderness.  Musculoskeletal: mild tenderness on ROM of hips  Neurological: He is alert and oriented to person, place, and time.  Skin:  Hypopigmented patches on dorsum of both hands, psoriatic lesions diffusely distributed on body surface.  Psychiatric:  Anxious   Assessment & Plan:   1. Avascular necrosis of bone of hip, unspecified laterality ([place]) Referred to [facility] and I have provided the number to the patient so he can be in touch with the practice. - acetaminophen-codeine (TYLENOL #3) 300-30 MG tablet; Take 1 tablet by mouth every 8 (eight) hours as needed for moderate pain.  Dispense: 60 tablet; Refill: 1  2. Seasonal allergies - olopatadine (PATANOL) 0.1 % ophthalmic solution; Place 1 drop into both eyes 2 (two) times daily.  Dispense: 5 mL; Refill: 2  3. 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  Dispense: 10 mL; Refill: 5 - Ambulatory referral to Ophthalmology  4. Generalized anxiety disorder - busPIRone (BUSPAR) 10 MG tablet; Take 1 tablet (10 mg total) by mouth 3 (three) times daily.  Dispense: 90 tablet; Refill: 3   Meds ordered this encounter  Medications  . olopatadine (PATANOL) 0.1 % ophthalmic solution    Sig: Place 1 drop into both  eyes 2 (two) times daily.    Dispense:  5 mL    Refill:  2  . busPIRone (BUSPAR) 10 MG tablet    Sig: Take 1 tablet (10 mg total) by mouth 3 (three) times daily.    Dispense:  90 tablet    Refill:  3  . acetaminophen-codeine (TYLENOL #3) 300-30 MG tablet    Sig: Take 1 tablet by mouth every 8 (eight) hours as needed for moderate pain.    Dispense:  60 tablet    Refill:  1  . insulin glargine (LANTUS) 100 UNIT/ML injection    Sig: Inject 0.25 mLs (25 Units total) into the skin at bedtime.    Dispense:  10 mL    Refill:  5    Follow-up: Return in about 1 month (around 01/06/2016) for Follow-up on diabetes mellitus.   [name] MD

## 2015-12-07 NOTE — Progress Notes (Signed)
Depression screen [facility] 2/9 12/07/2015 11/21/2015 09/13/2015 08/30/2015 07/23/2015  Decreased Interest 0 0 0 0 1  Down, Depressed, Hopeless 0 0 0 0 0  PHQ - 2 Score 0 0 0 0 1  Altered sleeping 1 - - - -  Tired, decreased energy 0 - - - -  Change in appetite 0 - - - -  Feeling bad or failure about yourself  0 - - - -  Trouble concentrating 0 - - - -  Moving slowly or fidgety/restless 0 - - - -  Suicidal thoughts 0 - - - -  PHQ-9 Score 1 - - - -    GAD 7 : Generalized Anxiety Score 12/07/2015 11/21/2015 07/23/2015  Nervous, Anxious, on Edge 1 1 0  Control/stop worrying 1 1 0  Worry too much - different things 1 1 0  Trouble relaxing 1 1 1   Restless 0 0 0  Easily annoyed or irritable 1 1 0  Afraid - awful might happen 1 1 0  Total GAD 7 Score 6 6 1

## 2015-12-07 NOTE — Progress Notes (Signed)
Patient's here for f/up DM and referral to orthopedic.  Patient c/o itchy, runny eyes.  Patient having anxiety off and on.  Patient reports taken his insulin at 9:30 this morning.

## 2015-12-10 ENCOUNTER — Other Ambulatory Visit: Payer: Self-pay | Admitting: Pharmacist

## 2015-12-10 IMAGING — CR DG ABD PORTABLE 1V
1 series · 1 of 1 positions shown · non-contrast
Comparison: None.

CLINICAL DATA: Initial evaluation for acute abdominal pain and
vomiting for 1 day

EXAM:
PORTABLE ABDOMEN - 1 VIEW

[AP]
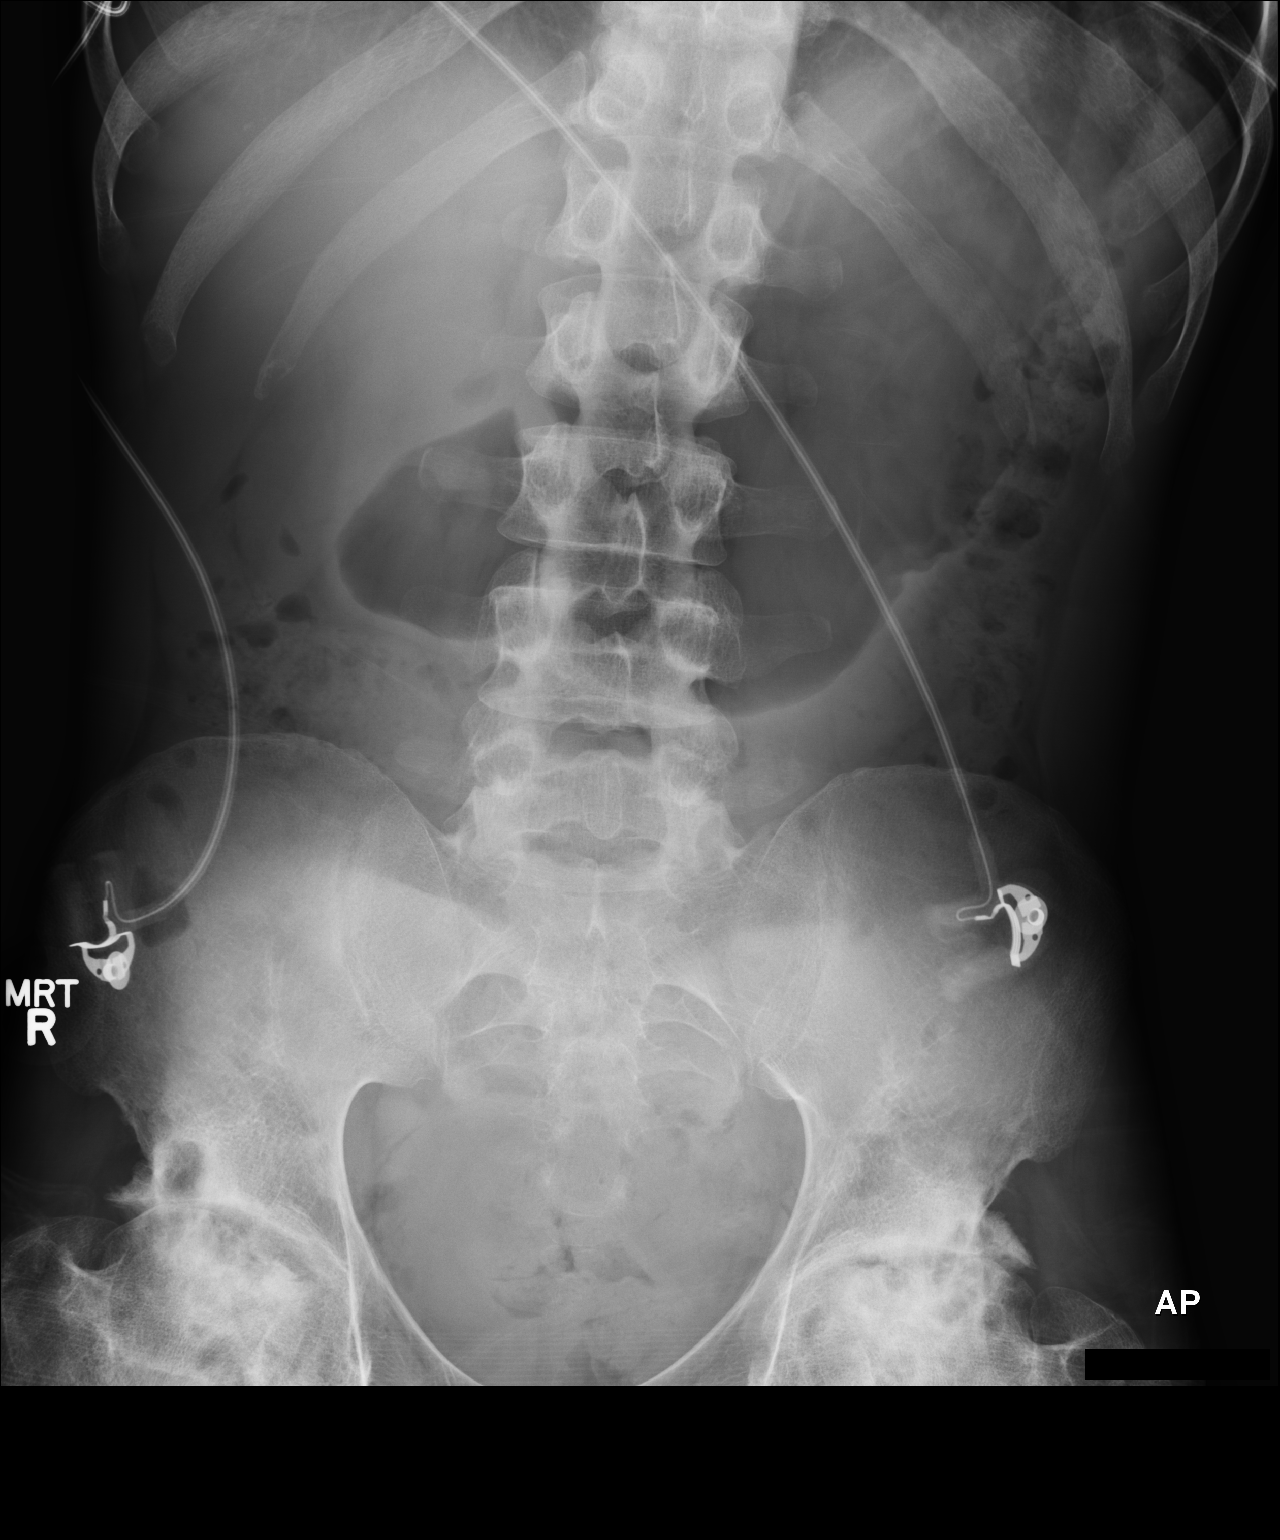

[1 of 1 positions shown; findings below may reference images not displayed]

FINDINGS: Moderate gaseous gastric distention. No other abnormally dilated
loops of bowel. No abnormal opacities over the abdomen or pelvis.
Markedly severe bilateral hip arthritis.
IMPRESSION: Gastric distention

Markedly severe hip arthritis.

## 2015-12-10 IMAGING — CR DG CHEST 1V PORT
2 series · 2 of 2 positions shown · non-contrast
Comparison: 01/23/2014

CLINICAL DATA: Initial evaluation for diabetic ketoacidosis,
patient noncompliance with insulin, patient complaining of nausea
vomiting diarrhea and dehydration shortness of breath chest pain dry
mouth, patient smokes

EXAM:
PORTABLE CHEST - 1 VIEW

[AP (1 of 2)]
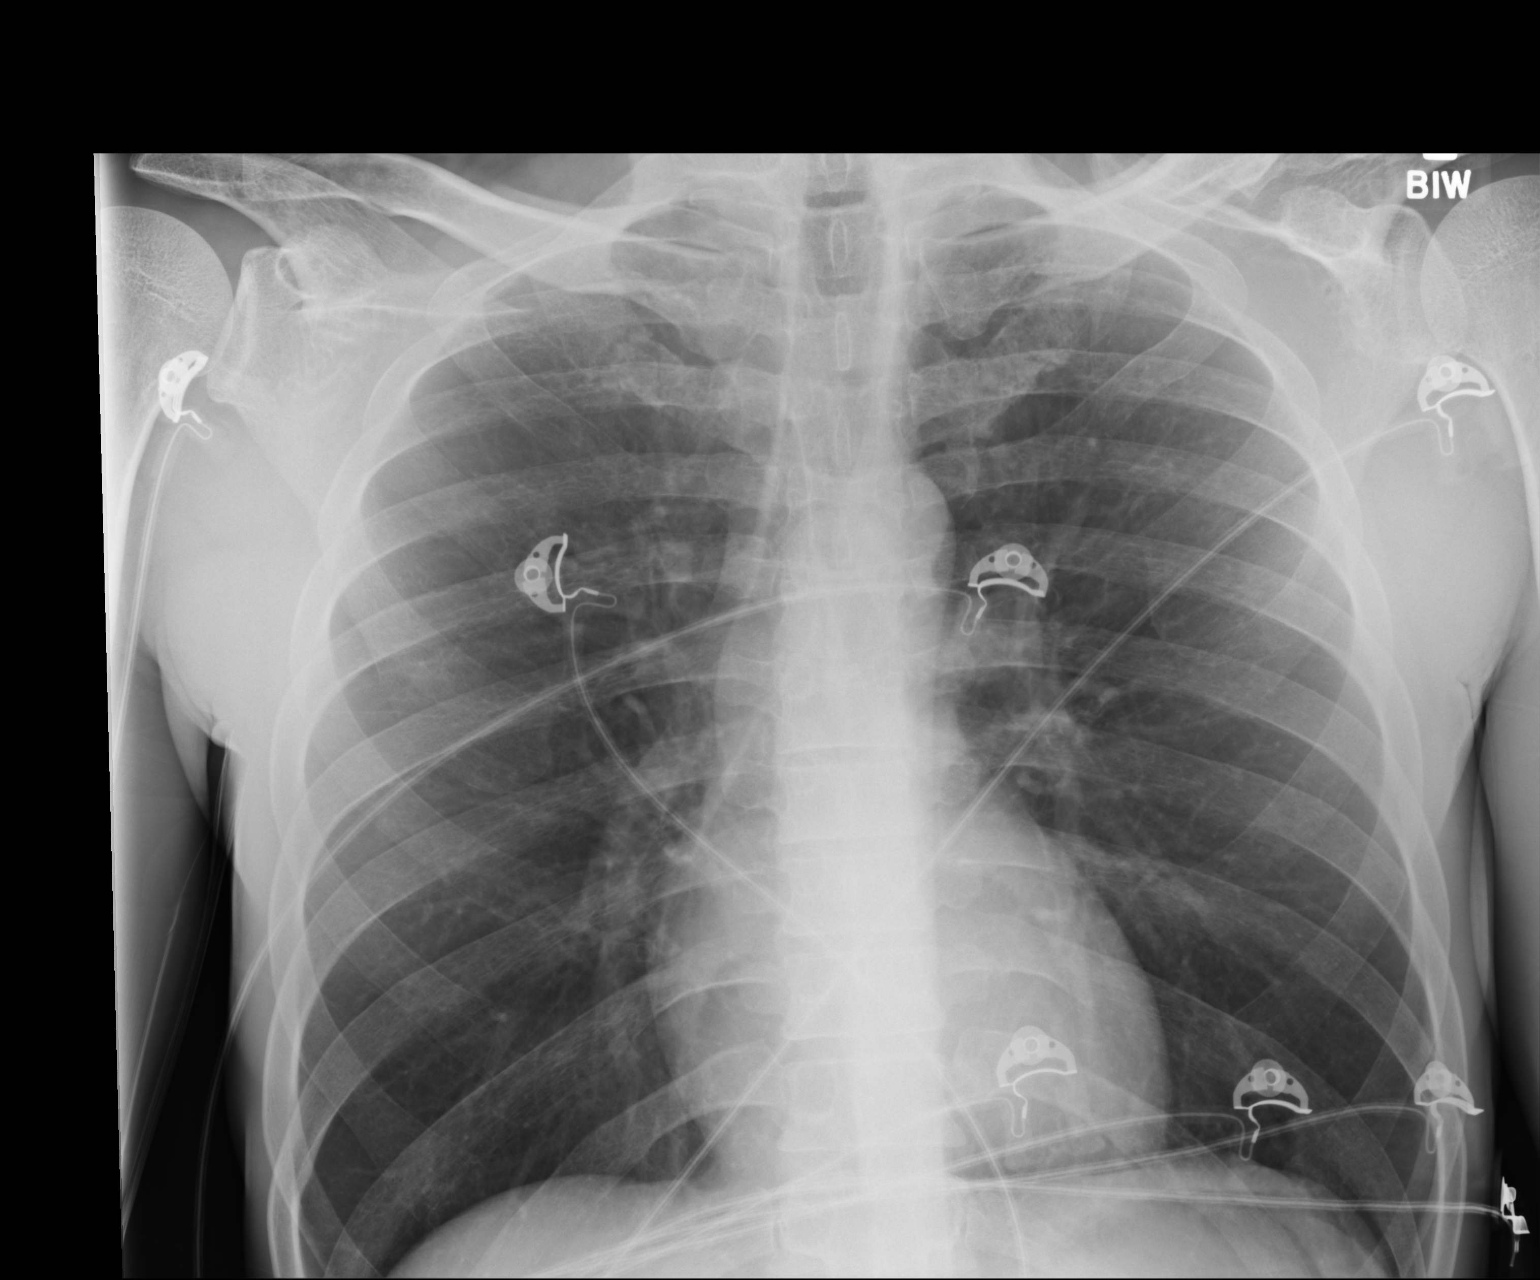

[AP (2 of 2)]
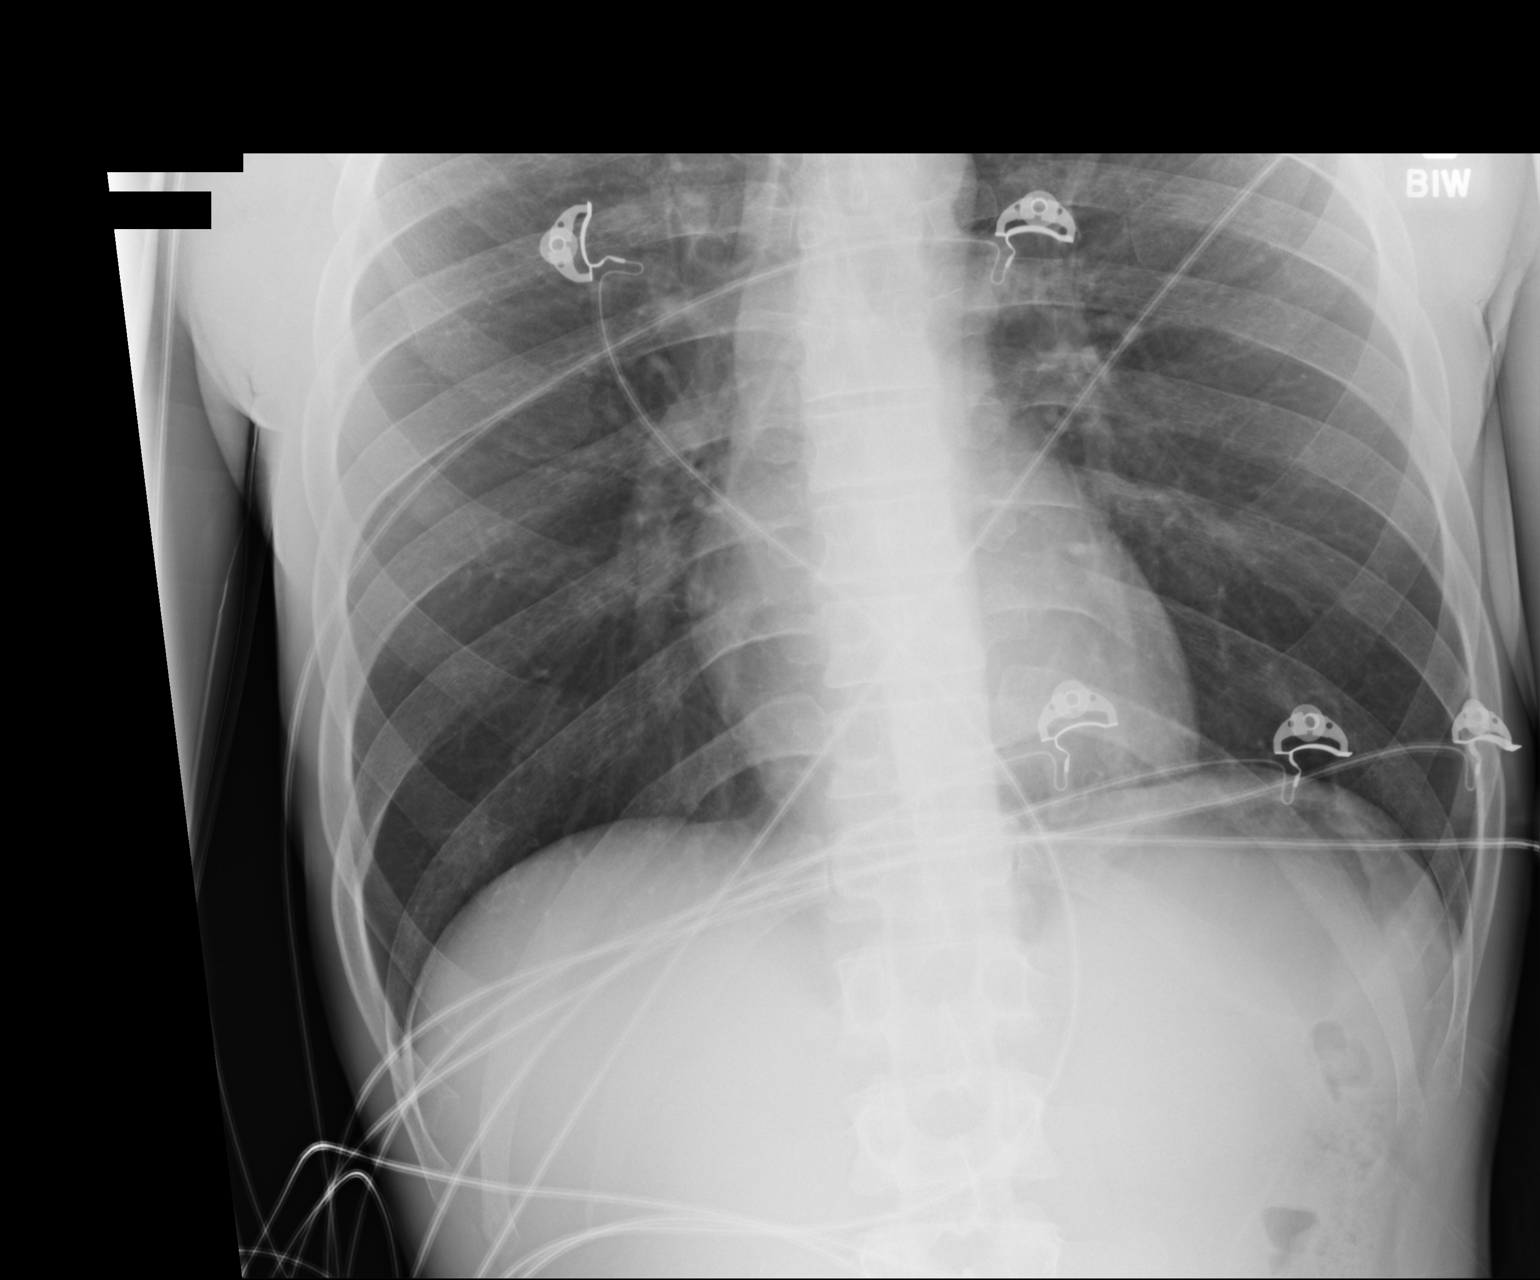

[2 of 2 positions shown; findings below may reference images not displayed]

FINDINGS: The heart size and mediastinal contours are within normal limits.
Both lungs are clear. The visualized skeletal structures are
unremarkable.
IMPRESSION: No active disease.

## 2015-12-10 MED ORDER — OLOPATADINE HCL 0.2 % OP SOLN: OPHTHALMIC | Status: DC

## 2015-12-17 ENCOUNTER — Other Ambulatory Visit: Payer: Medicaid Other

## 2015-12-19 ENCOUNTER — Ambulatory Visit: Payer: Medicaid Other | Attending: Family Medicine

## 2015-12-19 DIAGNOSIS — E1029 Type 1 diabetes mellitus with other diabetic kidney complication: Secondary | ICD-10-CM

## 2015-12-20 LAB — COMPLETE METABOLIC PANEL WITH GFR
ALBUMIN: 4.8 g/dL (ref 3.6–5.1)
ALT: 14 U/L (ref 9–46)
AST: 22 U/L (ref 10–40)
Alkaline Phosphatase: 127 U/L — ABNORMAL HIGH (ref 40–115)
BUN: 11 mg/dL (ref 7–25)
CO2: 19 mmol/L — AB (ref 20–31)
Calcium: 9.5 mg/dL (ref 8.6–10.3)
Chloride: 97 mmol/L — ABNORMAL LOW (ref 98–110)
Creat: 0.75 mg/dL (ref 0.60–1.35)
GFR, Est African American: >89 mL/min (ref >=60)
GLUCOSE: 405 mg/dL — AB (ref 65–99)
Potassium: 4.3 mmol/L (ref 3.5–5.3)
SODIUM: 133 mmol/L — AB (ref 135–146)
TOTAL PROTEIN: 7.4 g/dL (ref 6.1–8.1)
Total Bilirubin: 1.5 mg/dL — ABNORMAL HIGH (ref 0.2–1.2)

## 2015-12-20 LAB — LIPID PANEL
Cholesterol: 155 mg/dL (ref 125–200)
HDL: 68 mg/dL (ref >=40)
LDL CALC: 75 mg/dL (ref <130)
Total CHOL/HDL Ratio: 2.3 Ratio (ref <=5.0)
Triglycerides: 60 mg/dL (ref <150)
VLDL: 12 mg/dL (ref <30)

## 2015-12-20 LAB — MICROALBUMIN, URINE

## 2015-12-20 NOTE — Telephone Encounter (Signed)
Pt. Called stating that he was referred to [facility] and he actually wants to be referred to [facility]. Please f/u with pt.

## 2015-12-21 NOTE — Telephone Encounter (Signed)
I'm sorry but in the referral they didn't mention where to go . I will sent the referral to [place]   Thank you

## 2015-12-29 ENCOUNTER — Encounter (HOSPITAL_COMMUNITY): Payer: Self-pay | Admitting: *Deleted

## 2015-12-29 ENCOUNTER — Inpatient Hospital Stay (HOSPITAL_COMMUNITY)
Admission: EM | Admit: 2015-12-29 | Discharge: 2015-12-31 | DRG: 638 | Disposition: A | Discharge status: Home / Self Care | Payer: Medicaid Other | Attending: Internal Medicine | Admitting: Internal Medicine

## 2015-12-29 DIAGNOSIS — E10649 Type 1 diabetes mellitus with hypoglycemia without coma: Secondary | ICD-10-CM | POA: Diagnosis not present

## 2015-12-29 DIAGNOSIS — E0810 Diabetes mellitus due to underlying condition with ketoacidosis without coma: Secondary | ICD-10-CM | POA: Diagnosis not present

## 2015-12-29 DIAGNOSIS — R112 Nausea with vomiting, unspecified: Secondary | ICD-10-CM | POA: Diagnosis present

## 2015-12-29 DIAGNOSIS — Z79899 Other long term (current) drug therapy: Secondary | ICD-10-CM | POA: Diagnosis not present

## 2015-12-29 DIAGNOSIS — F17210 Nicotine dependence, cigarettes, uncomplicated: Secondary | ICD-10-CM | POA: Diagnosis present

## 2015-12-29 DIAGNOSIS — N179 Acute kidney failure, unspecified: Secondary | ICD-10-CM | POA: Diagnosis present

## 2015-12-29 DIAGNOSIS — E1029 Type 1 diabetes mellitus with other diabetic kidney complication: Secondary | ICD-10-CM

## 2015-12-29 DIAGNOSIS — E876 Hypokalemia: Secondary | ICD-10-CM | POA: Diagnosis present

## 2015-12-29 DIAGNOSIS — Z794 Long term (current) use of insulin: Secondary | ICD-10-CM | POA: Diagnosis not present

## 2015-12-29 DIAGNOSIS — Z833 Family history of diabetes mellitus: Secondary | ICD-10-CM

## 2015-12-29 DIAGNOSIS — E1110 Type 2 diabetes mellitus with ketoacidosis without coma: Secondary | ICD-10-CM | POA: Diagnosis present

## 2015-12-29 DIAGNOSIS — E1010 Type 1 diabetes mellitus with ketoacidosis without coma: Principal | ICD-10-CM | POA: Diagnosis present

## 2015-12-29 LAB — CBC WITH DIFFERENTIAL/PLATELET
Basophils Absolute: 0 10*3/uL (ref 0.0–0.1)
Basophils Relative: 0 %
EOS ABS: 0 10*3/uL (ref 0.0–0.7)
EOS PCT: 0 %
HCT: 50.4 % (ref 39.0–52.0)
Hemoglobin: 17.6 g/dL — ABNORMAL HIGH (ref 13.0–17.0)
LYMPHS ABS: 1.5 10*3/uL (ref 0.7–4.0)
Lymphocytes Relative: 13 %
MCH: 33 pg (ref 26.0–34.0)
MCHC: 34.9 g/dL (ref 30.0–36.0)
MCV: 94.6 fL (ref 78.0–100.0)
MONOS PCT: 4 %
Monocytes Absolute: 0.5 10*3/uL (ref 0.1–1.0)
NEUTROS PCT: 83 %
Neutro Abs: 9.4 10*3/uL — ABNORMAL HIGH (ref 1.7–7.7)
PLATELETS: 330 10*3/uL (ref 150–400)
RBC: 5.33 MIL/uL (ref 4.22–5.81)
RDW: 12.7 % (ref 11.5–15.5)
WBC: 11.3 10*3/uL — ABNORMAL HIGH (ref 4.0–10.5)

## 2015-12-29 LAB — BASIC METABOLIC PANEL
Anion gap: 16 — ABNORMAL HIGH (ref 5–15)
Anion gap: 10 (ref 5–15)
BUN: 16 mg/dL (ref 6–20)
BUN: 13 mg/dL (ref 6–20)
CHLORIDE: 102 mmol/L (ref 101–111)
CHLORIDE: 107 mmol/L (ref 101–111)
CO2: 22 mmol/L (ref 22–32)
CO2: 26 mmol/L (ref 22–32)
CREATININE: 1.07 mg/dL (ref 0.61–1.24)
CREATININE: 0.77 mg/dL (ref 0.61–1.24)
Calcium: 8.7 mg/dL — ABNORMAL LOW (ref 8.9–10.3)
Calcium: 9 mg/dL (ref 8.9–10.3)
GFR calc Af Amer: >60 mL/min (ref >60)
GFR calc Af Amer: >60 mL/min (ref >60)
GFR calc non Af Amer: >60 mL/min (ref >60)
GFR calc non Af Amer: >60 mL/min (ref >60)
GLUCOSE: 248 mg/dL — AB (ref 65–99)
GLUCOSE: 109 mg/dL — AB (ref 65–99)
Potassium: 4.1 mmol/L (ref 3.5–5.1)
Potassium: 3.9 mmol/L (ref 3.5–5.1)
SODIUM: 140 mmol/L (ref 135–145)
Sodium: 143 mmol/L (ref 135–145)

## 2015-12-29 LAB — URINALYSIS, ROUTINE W REFLEX MICROSCOPIC
Bilirubin Urine: NEGATIVE
Glucose, UA: >1000 mg/dL — AB
Hgb urine dipstick: NEGATIVE
Ketones, ur: >80 mg/dL — AB
LEUKOCYTES UA: NEGATIVE
NITRITE: NEGATIVE
PH: 5 (ref 5.0–8.0)
Protein, ur: NEGATIVE mg/dL
SPECIFIC GRAVITY, URINE: 1.028 (ref 1.005–1.030)

## 2015-12-29 LAB — BLOOD GAS, VENOUS
Acid-base deficit: 10.4 mmol/L — ABNORMAL HIGH (ref 0.0–2.0)
Bicarbonate: 15.4 mEq/L — ABNORMAL LOW (ref 20.0–24.0)
O2 Saturation: 72.5 %
PCO2 VEN: 34.5 mmHg — AB (ref 45.0–50.0)
PH VEN: 7.271 (ref 7.250–7.300)
PO2 VEN: 45 mmHg (ref 31.0–45.0)
Patient temperature: 98.6
TCO2: 13.4 mmol/L (ref 0–100)

## 2015-12-29 LAB — GLUCOSE, CAPILLARY: Glucose-Capillary: 165 mg/dL — ABNORMAL HIGH (ref 65–99)

## 2015-12-29 LAB — COMPREHENSIVE METABOLIC PANEL
ALT: 30 U/L (ref 17–63)
ANION GAP: 27 — AB (ref 5–15)
AST: 44 U/L — ABNORMAL HIGH (ref 15–41)
Albumin: 5.3 g/dL — ABNORMAL HIGH (ref 3.5–5.0)
Alkaline Phosphatase: 167 U/L — ABNORMAL HIGH (ref 38–126)
BUN: 19 mg/dL (ref 6–20)
CHLORIDE: 99 mmol/L — AB (ref 101–111)
CO2: 15 mmol/L — AB (ref 22–32)
CREATININE: 1.39 mg/dL — AB (ref 0.61–1.24)
Calcium: 9.6 mg/dL (ref 8.9–10.3)
GFR calc non Af Amer: >60 mL/min (ref >60)
Glucose, Bld: 415 mg/dL — ABNORMAL HIGH (ref 65–99)
POTASSIUM: 4.3 mmol/L (ref 3.5–5.1)
SODIUM: 141 mmol/L (ref 135–145)
Total Bilirubin: 1.8 mg/dL — ABNORMAL HIGH (ref 0.3–1.2)
Total Protein: 8.5 g/dL — ABNORMAL HIGH (ref 6.5–8.1)

## 2015-12-29 LAB — CBG MONITORING, ED
GLUCOSE-CAPILLARY: 398 mg/dL — AB (ref 65–99)
GLUCOSE-CAPILLARY: 363 mg/dL — AB (ref 65–99)
Glucose-Capillary: 266 mg/dL — ABNORMAL HIGH (ref 65–99)

## 2015-12-29 LAB — URINE MICROSCOPIC-ADD ON
RBC / HPF: NONE SEEN RBC/hpf (ref 0–5)
WBC UA: NONE SEEN WBC/hpf (ref 0–5)

## 2015-12-29 LAB — MRSA PCR SCREENING: MRSA by PCR: NEGATIVE

## 2015-12-29 MED ORDER — DEXTROSE-NACL 5-0.45 % IV SOLN: INTRAVENOUS | Status: DC

## 2015-12-29 MED ORDER — DIPHENOXYLATE-ATROPINE 2.5-0.025 MG/5ML PO LIQD
10.0000 mL | Freq: Four times a day (QID) | ORAL | Status: DC | PRN
  Administered 2015-12-29: 10 mL via ORAL
  Filled 2015-12-29: qty 10

## 2015-12-29 MED ORDER — DEXTROSE-NACL 5-0.45 % IV SOLN
INTRAVENOUS | Status: DC
  Administered 2015-12-29: 19:00:00 via INTRAVENOUS

## 2015-12-29 MED ORDER — ONDANSETRON HCL 4 MG/2ML IJ SOLN: 4.0000 mg | Freq: Four times a day (QID) | INTRAMUSCULAR | Status: DC | PRN

## 2015-12-29 MED ORDER — SODIUM CHLORIDE 0.9 % IV SOLN
INTRAVENOUS | Status: DC
  Administered 2015-12-29: 3 [IU]/h via INTRAVENOUS
  Filled 2015-12-29: qty 2.5

## 2015-12-29 MED ORDER — SODIUM CHLORIDE 0.9 % IV SOLN: INTRAVENOUS | Status: DC

## 2015-12-29 MED ORDER — BUSPIRONE HCL 5 MG PO TABS
10.0000 mg | ORAL_TABLET | Freq: Three times a day (TID) | ORAL | Status: DC
  Administered 2015-12-29: 10 mg via ORAL
  Filled 2015-12-29 (×2): qty 1
  Filled 2015-12-29: qty 2
  Filled 2015-12-29: qty 1
  Filled 2015-12-29: qty 2
  Filled 2015-12-29 (×2): qty 1

## 2015-12-29 MED ORDER — INSULIN GLARGINE 100 UNIT/ML ~~LOC~~ SOLN
15.0000 [IU] | Freq: Every day | SUBCUTANEOUS | Status: DC
  Administered 2015-12-30: 15 [IU] via SUBCUTANEOUS
  Filled 2015-12-29: qty 0.15

## 2015-12-29 MED ORDER — SODIUM CHLORIDE 0.9 % IV SOLN: INTRAVENOUS | Status: AC

## 2015-12-29 MED ORDER — SODIUM CHLORIDE 0.9 % IV BOLUS (SEPSIS)
1000.0000 mL | Freq: Once | INTRAVENOUS | Status: AC
Start: 2015-12-29 — End: 2015-12-29
  Administered 2015-12-29: 1000 mL via INTRAVENOUS

## 2015-12-29 MED ORDER — SODIUM CHLORIDE 0.9 % IV BOLUS (SEPSIS)
1000.0000 mL | Freq: Once | INTRAVENOUS | Status: AC
  Administered 2015-12-29: 1000 mL via INTRAVENOUS

## 2015-12-29 MED ORDER — ENOXAPARIN SODIUM 40 MG/0.4ML ~~LOC~~ SOLN
40.0000 mg | SUBCUTANEOUS | Status: DC
  Filled 2015-12-29 (×2): qty 0.4

## 2015-12-29 MED ORDER — SODIUM CHLORIDE 0.9 % IV SOLN
INTRAVENOUS | Status: DC
  Administered 2015-12-29: 18:00:00 via INTRAVENOUS

## 2015-12-29 MED ORDER — POTASSIUM CHLORIDE 10 MEQ/100ML IV SOLN
10.0000 meq | INTRAVENOUS | Status: AC
  Administered 2015-12-29 (×2): 10 meq via INTRAVENOUS
  Filled 2015-12-29 (×2): qty 100

## 2015-12-29 NOTE — ED Notes (Signed)
Bed: [id number] Expected date:  Expected time:  Means of arrival:  Comments: hyperglycemia

## 2015-12-29 NOTE — ED Notes (Signed)
Per EMS, pt with hx of type 1 DM complains of hyperglycemia, n/v/d. CBG 460 for EMS. Pt states he took his insulin this morning.

## 2015-12-29 NOTE — ED Notes (Signed)
20 min timer started.  

## 2015-12-29 NOTE — H&P (Addendum)
History and Physical    Jared Murray [id number] DOB: Jan 01, 1976 DOA: 12/29/2015  PCP: [name], MD  Patient coming from: Home  Chief Complaint: Nausea vomiting diarrhea  HPI: Jared Murray is a 40 y.o. male with medical history significant of type 1 diabetes mellitus, having a history of requiring hospitalizations for DKA, was last hospitalized from 11/02/2015 through 11/04/2015 for diabetic ketoacidosis. He reports missing his insulin yesterday. Overnight developed multiple episodes of nausea, vomiting and diarrhea characterized as nonbloody. He also reports generalized weakness, malaise, feeling ill. He thinks he may have had sick contacts recently. He denies fevers, chills, chest pain, shortness of breath.   ED Course: In the emergency department lab work revealed blood sugar of 415 with an anion gap of 27, bicarbonate 15 and the presence of ketones on urinalysis. He was started on IV insulin and IV fluids.  Review of Systems: As per HPI otherwise 10 point review of systems negative.   Past Medical History  Diagnosis Date  . Eczema   . Type 1 diabetes mellitus ([place])   . Psoriasis   . GERD (gastroesophageal reflux disease)   . DKA (diabetic ketoacidoses) [facility])     Past Surgical History  Procedure Laterality Date  . Finger surgery       reports that he has been smoking Cigarettes.  He has a 2.5 pack-year smoking history. He has never used smokeless tobacco. He reports that he drinks alcohol. He reports that he does not use illicit drugs.  Allergies  Allergen Reactions  . Latex Itching and Rash  . Penicillins Other (See Comments)    Childhood allergy Has patient had a PCN reaction causing immediate rash, facial/tongue/throat swelling, SOB or lightheadedness with hypotension: NO Has patient had a PCN reaction causing severe rash involving mucus membranes or skin necrosis:NO Has patient had a PCN reaction that required hospitalization NO Has patient had a PCN  reaction occurring within the last 10 years: NO If all of the above answers are "NO", then may proceed with Cephalosporin use.    Family History  Problem Relation Age of Onset  . Hypertension Father   . Diabetes      multiple  . Lupus Cousin   . Stroke Maternal Grandmother   . Stroke Paternal Grandmother      Prior to Admission medications   Medication Sig Start Date End Date Taking? Authorizing Provider  acetaminophen-codeine (TYLENOL #3) 300-30 MG tablet Take 1 tablet by mouth every 8 (eight) hours as needed for moderate pain. 12/07/15  Yes [name], MD  busPIRone (BUSPAR) 10 MG tablet Take 1 tablet (10 mg total) by mouth 3 (three) times daily. Patient taking differently: Take 10 mg by mouth daily as needed (anxiety).  12/07/15  Yes [name], MD  clobetasol cream (TEMOVATE) AB-123456789 % Apply 1 application topically 2 (two) times daily. 04/13/15  Yes [name], MD  feeding supplement, GLUCERNA SHAKE, (GLUCERNA SHAKE) LIQD Take 237 mLs by mouth 3 (three) times daily between meals. 07/06/15  Yes [name], MD  insulin glargine (LANTUS) 100 UNIT/ML injection Inject 0.25 mLs (25 Units total) into the skin at bedtime. 12/07/15  Yes [name], MD  NOVOLOG 100 UNIT/ML injection 5 units subcutaneous before breakfast, 7 units before lunch and 7 units before dinner. Patient taking differently: 1-10 Units. Using a sliding scale 11/21/15  Yes [name], MD  pantoprazole (PROTONIX) 40 MG tablet Take 1 tablet (40 mg total) by mouth daily. 01/29/15  Yes [name], MD  Blood Glucose Monitoring Suppl (ACCU-CHEK AVIVA) device Use as instructed 06/27/15 06/26/16  Leone Brand, MD  glucose blood test strip Accu-chek aviva. Check blood sugar 3 times before meals and at bedtime 09/13/15   [name], MD  Insulin Syringe-Needle U-100 (B-D INS SYRINGE 2CC/29GX1/2") 29G X 1/2" 2 ML MISC Check Blood sugar TID and QHS 03/22/14   Tresa Garter, MD  Lancet Devices [facility]) lancets Use as  instructed three times daily. 11/21/15   [name], MD  nicotine (NICODERM CQ - DOSED IN MG/24 HOURS) 14 mg/24hr patch Place 1 patch (14 mg total) onto the skin daily. Patient not taking: Reported on 12/29/2015 09/13/15   [name], MD  Olopatadine HCl 0.2 % SOLN Place 1 drop in each eye twice daily Patient not taking: Reported on 12/29/2015 12/10/15   [name], MD    Physical Exam: Filed Vitals:   12/29/15 1404  BP: 121/90  Pulse: 86  Temp: 97.7 F (36.5 C)  TempSrc: Oral  Resp: 16  SpO2: 100%    Constitutional: Ill-appearing though no acute distress awake and alert Filed Vitals:   12/29/15 1404  BP: 121/90  Pulse: 86  Temp: 97.7 F (36.5 C)  TempSrc: Oral  Resp: 16  SpO2: 100%   Eyes: PERRL, lids and conjunctivae normal ENMT: Dry oral mucosa  Neck: normal, supple, no masses, no thyromegaly Respiratory: clear to auscultation bilaterally, no wheezing, no crackles. Normal respiratory effort. No accessory muscle use.  Cardiovascular: Tachycardic, Regular rate and rhythm, no murmurs / rubs / gallops. No extremity edema. 2+ pedal pulses. No carotid bruits.  Abdomen: no tenderness, no masses palpated. No hepatosplenomegaly. Bowel sounds positive.  Musculoskeletal: no clubbing / cyanosis. No joint deformity upper and lower extremities. Good ROM, no contractures. Normal muscle tone.  Skin: no rashes, lesions, ulcers. No induration Neurologic: CN 2-12 grossly intact. Sensation intact, DTR normal. Strength 5/5 in all 4.  Psychiatric: Normal judgment and insight. Alert and oriented x 3. Normal mood.   Labs on Admission: I have personally reviewed following labs and imaging studies  CBC:  Recent Labs Lab 12/29/15 1432  WBC 11.3*  NEUTROABS 9.4*  HGB 17.6*  HCT 50.4  MCV 94.6  PLT [id number]   Basic Metabolic Panel:  Recent Labs Lab 12/29/15 1432  NA 141  K 4.3  CL 99*  CO2 15*  GLUCOSE 415*  BUN 19  CREATININE 1.39*  CALCIUM 9.6   GFR: CrCl cannot be calculated  (Unknown ideal weight.). Liver Function Tests:  Recent Labs Lab 12/29/15 1432  AST 44*  ALT 30  ALKPHOS 167*  BILITOT 1.8*  PROT 8.5*  ALBUMIN 5.3*   No results for input(s): LIPASE, AMYLASE in the last 168 hours. No results for input(s): AMMONIA in the last 168 hours. Coagulation Profile: No results for input(s): INR, PROTIME in the last 168 hours. Cardiac Enzymes: No results for input(s): CKTOTAL, CKMB, CKMBINDEX, TROPONINI in the last 168 hours. BNP (last 3 results) No results for input(s): PROBNP in the last 8760 hours. HbA1C: No results for input(s): HGBA1C in the last 72 hours. CBG:  Recent Labs Lab 12/29/15 1422  GLUCAP 398*   Lipid Profile: No results for input(s): CHOL, HDL, LDLCALC, TRIG, CHOLHDL, LDLDIRECT in the last 72 hours. Thyroid Function Tests: No results for input(s): TSH, T4TOTAL, FREET4, T3FREE, THYROIDAB in the last 72 hours. Anemia Panel: No results for input(s): VITAMINB12, FOLATE, FERRITIN, TIBC, IRON, RETICCTPCT in the last 72 hours. Urine analysis:    Component  Value Date/Time   COLORURINE YELLOW 12/29/2015 1411   APPEARANCEUR CLEAR 12/29/2015 1411   LABSPEC 1.028 12/29/2015 1411   PHURINE 5.0 12/29/2015 1411   GLUCOSEU >1000* 12/29/2015 1411   HGBUR NEGATIVE 12/29/2015 1411   BILIRUBINUR NEGATIVE 12/29/2015 1411   BILIRUBINUR neg 03/16/2015 1230   KETONESUR >80* 12/29/2015 1411   PROTEINUR NEGATIVE 12/29/2015 1411   PROTEINUR neg 03/16/2015 1230   UROBILINOGEN 0.2 06/25/2015 0913   UROBILINOGEN 0.2 03/16/2015 1230   NITRITE NEGATIVE 12/29/2015 1411   NITRITE neg 03/16/2015 1230   LEUKOCYTESUR NEGATIVE 12/29/2015 1411   Sepsis Labs: !!!!!!!!!!!!!!!!!!!!!!!!!!!!!!!!!!!!!!!!!!!! @LABRCNTIP (procalcitonin:4,lacticidven:4) )No results found for this or any previous visit (from the past 240 hour(s)).   Radiological Exams on Admission: No results found.  EKG: Independently reviewed.   Assessment/Plan Principal Problem:   Diabetic  ketoacidosis without coma associated with type 1 diabetes mellitus ([place]) Active Problems:   AKI (acute kidney injury) ([place])   Nausea & vomiting   DKA (diabetic ketoacidoses) ([place])  1.  Diabetic ketoacidosis. Mr. Scibelli having a history of type 1 diabetes, having previous hospitalizations for DKA, presented with complaints of nausea vomiting and diarrhea. Lab work in the emergency room showing a glucose of 4:15 with an anion gap of 27, bicarbonate 15 and presence of ketones in urine. He reports missing his insulin dose yesterday. He is on Lantus 25 units subcutaneous at nighttime. Will place him on the diabetic unit acidosis protocol, treat with IV fluids, IV insulin, valleculae replacement. Follow serial BMPs. Admit to the step down unit for close monitoring.  2.  Nausea/vomiting/diarrhea. Mr. Dornbush reporting having multiple episodes of nausea, vomiting, diarrhea. He thinks he may have had sick contacts this past week. I suspect symptoms could be related to infectious gastroenteritis. Infection could have precipitated DKA, although he has had previous hospitalizations for DKA in setting of noncompliance. Will provide supportive care, IV fluids, as needed lomotil. Will be Nothing by mouth for now.  3.  Acute kidney injury. Lab work showing elevated creatinine 1.39, previously 0.75 on 12/19/2015, likely secondary to GI losses from nausea, vomiting, diarrhea. Provide IV fluid resuscitation   DVT prophylaxis: Lovenox Code Status: Full code Family Communication: Family not present Disposition Plan: Will require greater at least 2 nights hospitalization Consults called: Admission status: Will admit to the step down unit   Kelvin Cellar MD [facility] Pager [phone]  If 7PM-7AM, please contact night-coverage [email or web address] Password [facility]  12/29/2015, 3:47 PM

## 2015-12-29 NOTE — ED Provider Notes (Signed)
CSN: [id number]     Arrival date & time 12/29/15  1351 History   First MD Initiated Contact with Patient 12/29/15 1359     Chief Complaint  Patient presents with  . Hyperglycemia     (Consider location/radiation/quality/duration/timing/severity/associated sxs/prior Treatment) Patient is a 40 y.o. male presenting with hyperglycemia.  Hyperglycemia Associated symptoms: fatigue, increased thirst, nausea, polyuria and vomiting   Associated symptoms: no abdominal pain, no chest pain, no dysuria, no fever and no shortness of breath    40 year old male with a history of diabetes type 1 presents with concern for nausea vomiting and diarrhea beginning last night. Patient reports suspected sick contacts when visiting clinics for regular appts this week as well as family member with diarrhea. At 3 AM, developed nausea vomiting and diarrhea, reporting 10 episodes of small volume diarrhea, and 10 episodes of vomiting. No black/bloody stool. No recent antibiotics. Reports for not taking his insulin last night, however had been compliant. Took his insulin last night at 3 AM, and again this morning, however his glucose increased to 460s on EMS arrival.   Past Medical History  Diagnosis Date  . Eczema   . Type 1 diabetes mellitus ([place])   . Psoriasis   . GERD (gastroesophageal reflux disease)   . DKA (diabetic ketoacidoses) [facility])    Past Surgical History  Procedure Laterality Date  . Finger surgery     Family History  Problem Relation Age of Onset  . Hypertension Father   . Diabetes      multiple  . Lupus Cousin   . Stroke Maternal Grandmother   . Stroke Paternal Grandmother    Social History  Substance Use Topics  . Smoking status: Current Some Day Smoker -- 0.25 packs/day for 10 years    Types: Cigarettes  . Smokeless tobacco: Never Used     Comment: Smoking 2-3 cigs/week  . Alcohol Use: 0.0 oz/week    0 Standard drinks or equivalent per week     Comment: ocassional    Review of  Systems  Constitutional: Positive for fatigue. Negative for fever.  HENT: Negative for sore throat.   Eyes: Negative for visual disturbance.  Respiratory: Negative for shortness of breath.   Cardiovascular: Negative for chest pain.  Gastrointestinal: Positive for nausea, vomiting and diarrhea. Negative for abdominal pain.  Endocrine: Positive for polydipsia and polyuria.  Genitourinary: Negative for dysuria and difficulty urinating.  Musculoskeletal: Negative for back pain and neck stiffness.  Skin: Negative for rash.  Neurological: Negative for syncope and headaches.      Allergies  Latex and Penicillins  Home Medications   Prior to Admission medications   Medication Sig Start Date End Date Taking? Authorizing Provider  acetaminophen-codeine (TYLENOL #3) 300-30 MG tablet Take 1 tablet by mouth every 8 (eight) hours as needed for moderate pain. 12/07/15  Yes [name], MD  busPIRone (BUSPAR) 10 MG tablet Take 1 tablet (10 mg total) by mouth 3 (three) times daily. Patient taking differently: Take 10 mg by mouth daily as needed (anxiety).  12/07/15  Yes [name], MD  clobetasol cream (TEMOVATE) AB-123456789 % Apply 1 application topically 2 (two) times daily. 04/13/15  Yes [name], MD  feeding supplement, GLUCERNA SHAKE, (GLUCERNA SHAKE) LIQD Take 237 mLs by mouth 3 (three) times daily between meals. 07/06/15  Yes [name], MD  insulin glargine (LANTUS) 100 UNIT/ML injection Inject 0.25 mLs (25 Units total) into the skin at bedtime. 12/07/15  Yes [name], MD  NOVOLOG 100 UNIT/ML  injection 5 units subcutaneous before breakfast, 7 units before lunch and 7 units before dinner. Patient taking differently: 1-10 Units. Using a sliding scale 11/21/15  Yes [name], MD  pantoprazole (PROTONIX) 40 MG tablet Take 1 tablet (40 mg total) by mouth daily. 01/29/15  Yes [name], MD  Blood Glucose Monitoring Suppl (ACCU-CHEK AVIVA) device Use as instructed 06/27/15 06/26/16  Leone Brand, MD  glucose blood test strip Accu-chek aviva. Check blood sugar 3 times before meals and at bedtime 09/13/15   [name], MD  Insulin Syringe-Needle U-100 (B-D INS SYRINGE 2CC/29GX1/2") 29G X 1/2" 2 ML MISC Check Blood sugar TID and QHS 03/22/14   Tresa Garter, MD  Lancet Devices [facility]) lancets Use as instructed three times daily. 11/21/15   [name], MD  nicotine (NICODERM CQ - DOSED IN MG/24 HOURS) 14 mg/24hr patch Place 1 patch (14 mg total) onto the skin daily. Patient not taking: Reported on 12/29/2015 09/13/15   [name], MD  Olopatadine HCl 0.2 % SOLN Place 1 drop in each eye twice daily Patient not taking: Reported on 12/29/2015 12/10/15   [name], MD   BP 101/71 mmHg  Pulse 75  Temp(Src) 97.4 F (36.3 C) (Oral)  Resp 14  Ht 5\' 10"  (1.778 m)  Wt 121 lb 4.1 oz (55 kg)  BMI 17.40 kg/m2  SpO2 99% Physical Exam  Constitutional: He is oriented to person, place, and time. He appears well-developed and well-nourished. No distress.  HENT:  Head: Normocephalic and atraumatic.  Mouth/Throat: Mucous membranes are dry.  Eyes: Conjunctivae and EOM are normal.  Neck: Normal range of motion.  Cardiovascular: Normal rate, regular rhythm, normal heart sounds and intact distal pulses.  Exam reveals no gallop and no friction rub.   No murmur heard. Pulmonary/Chest: Effort normal and breath sounds normal. No respiratory distress. He has no wheezes. He has no rales.  Abdominal: Soft. He exhibits no distension. There is no tenderness. There is no guarding.  Musculoskeletal: He exhibits no edema.  Neurological: He is alert and oriented to person, place, and time.  Skin: Skin is warm and dry. He is not diaphoretic.  Nursing note and vitals reviewed.   ED Course  Procedures (including critical care time) Labs Review Labs Reviewed  CBC WITH DIFFERENTIAL/PLATELET - Abnormal; Notable for the following:    WBC 11.3 (*)    Hemoglobin 17.6 (*)    Neutro Abs 9.4  (*)    All other components within normal limits  COMPREHENSIVE METABOLIC PANEL - Abnormal; Notable for the following:    Chloride 99 (*)    CO2 15 (*)    Glucose, Bld 415 (*)    Creatinine, Ser 1.39 (*)    Total Protein 8.5 (*)    Albumin 5.3 (*)    AST 44 (*)    Alkaline Phosphatase 167 (*)    Total Bilirubin 1.8 (*)    Anion gap 27 (*)    All other components within normal limits  URINALYSIS, ROUTINE W REFLEX MICROSCOPIC (NOT AT [facility]) - Abnormal; Notable for the following:    Glucose, UA >1000 (*)    Ketones, ur >80 (*)    All other components within normal limits  BLOOD GAS, VENOUS - Abnormal; Notable for the following:    pCO2, Ven 34.5 (*)    Bicarbonate 15.4 (*)    Acid-base deficit 10.4 (*)    All other components within normal limits  URINE MICROSCOPIC-ADD ON - Abnormal; Notable for  the following:    Squamous Epithelial / LPF 0-5 (*)    Bacteria, UA RARE (*)    All other components within normal limits  BASIC METABOLIC PANEL - Abnormal; Notable for the following:    Glucose, Bld 248 (*)    Calcium 8.7 (*)    Anion gap 16 (*)    All other components within normal limits  BASIC METABOLIC PANEL - Abnormal; Notable for the following:    Glucose, Bld 109 (*)    All other components within normal limits  GLUCOSE, CAPILLARY - Abnormal; Notable for the following:    Glucose-Capillary 165 (*)    All other components within normal limits  CBG MONITORING, ED - Abnormal; Notable for the following:    Glucose-Capillary 398 (*)    All other components within normal limits  CBG MONITORING, ED - Abnormal; Notable for the following:    Glucose-Capillary 363 (*)    All other components within normal limits  CBG MONITORING, ED - Abnormal; Notable for the following:    Glucose-Capillary 266 (*)    All other components within normal limits  MRSA PCR SCREENING  BASIC METABOLIC PANEL  BASIC METABOLIC PANEL  BASIC METABOLIC PANEL  BASIC METABOLIC PANEL    Imaging Review No  results found. I have personally reviewed and evaluated these images and lab results as part of my medical decision-making.   EKG Interpretation None      CRITICAL CARE: DKA Performed by: Alvino Chapel   Total critical care time: 30 minutes  Critical care time was exclusive of separately billable procedures and treating other patients.  Critical care was necessary to treat or prevent imminent or life-threatening deterioration.  Critical care was time spent personally by me on the following activities: development of treatment plan with patient and/or surrogate as well as nursing, discussions with consultants, evaluation of patient's response to treatment, examination of patient, obtaining history from patient or surrogate, ordering and performing treatments and interventions, ordering and review of laboratory studies, ordering and review of radiographic studies, pulse oximetry and re-evaluation of patient's condition.  MDM   Final diagnoses:  Diabetic ketoacidosis without coma associated with type 1 diabetes mellitus ([place])    40 year old male with a history of diabetes type 1 presents with concern for nausea vomiting and diarrhea beginning last night. Patient reports suspected sick contacts when visiting clinics for regular appts this week as well as family member with diarrhea. No recent antibiotics and have low suspicion for C. difficile. Abdominal exam is benign and doubt acute intra-abdominal process. Patient most likely with a viral gastroenteritis.  Labs obtained given hx of DM show signs of DKA with bicarb of 15, and AG of 27 and ketones in urine.  K 4.3. Insulin gtt initiated and pt admitted to stepdown.    Gareth Morgan, MD 12/30/15 [phone]

## 2015-12-30 DIAGNOSIS — R112 Nausea with vomiting, unspecified: Secondary | ICD-10-CM

## 2015-12-30 LAB — BASIC METABOLIC PANEL
ANION GAP: 9 (ref 5–15)
BUN: 12 mg/dL (ref 6–20)
CALCIUM: 8.7 mg/dL — AB (ref 8.9–10.3)
CO2: 24 mmol/L (ref 22–32)
CREATININE: 0.68 mg/dL (ref 0.61–1.24)
Chloride: 108 mmol/L (ref 101–111)
GLUCOSE: 108 mg/dL — AB (ref 65–99)
Potassium: 3.3 mmol/L — ABNORMAL LOW (ref 3.5–5.1)
Sodium: 141 mmol/L (ref 135–145)

## 2015-12-30 LAB — GLUCOSE, CAPILLARY
GLUCOSE-CAPILLARY: 101 mg/dL — AB (ref 65–99)
GLUCOSE-CAPILLARY: 102 mg/dL — AB (ref 65–99)
GLUCOSE-CAPILLARY: 108 mg/dL — AB (ref 65–99)
GLUCOSE-CAPILLARY: 113 mg/dL — AB (ref 65–99)
GLUCOSE-CAPILLARY: 121 mg/dL — AB (ref 65–99)
GLUCOSE-CAPILLARY: 188 mg/dL — AB (ref 65–99)
GLUCOSE-CAPILLARY: 242 mg/dL — AB (ref 65–99)
GLUCOSE-CAPILLARY: 252 mg/dL — AB (ref 65–99)
GLUCOSE-CAPILLARY: 49 mg/dL — AB (ref 65–99)
GLUCOSE-CAPILLARY: 99 mg/dL (ref 65–99)
Glucose-Capillary: 199 mg/dL — ABNORMAL HIGH (ref 65–99)
Glucose-Capillary: 107 mg/dL — ABNORMAL HIGH (ref 65–99)
Glucose-Capillary: 128 mg/dL — ABNORMAL HIGH (ref 65–99)

## 2015-12-30 MED ORDER — INSULIN ASPART 100 UNIT/ML ~~LOC~~ SOLN
0.0000 [IU] | Freq: Three times a day (TID) | SUBCUTANEOUS | Status: DC
  Administered 2015-12-30: 2 [IU] via SUBCUTANEOUS
  Administered 2015-12-30: 3 [IU] via SUBCUTANEOUS

## 2015-12-30 MED ORDER — INSULIN GLARGINE 100 UNIT/ML ~~LOC~~ SOLN
25.0000 [IU] | Freq: Every day | SUBCUTANEOUS | Status: DC
  Administered 2015-12-30: 25 [IU] via SUBCUTANEOUS
  Filled 2015-12-30: qty 0.25

## 2015-12-30 MED ORDER — POTASSIUM CHLORIDE CRYS ER 20 MEQ PO TBCR
40.0000 meq | EXTENDED_RELEASE_TABLET | Freq: Once | ORAL | Status: AC
  Administered 2015-12-30: 40 meq via ORAL
  Filled 2015-12-30: qty 2

## 2015-12-30 MED ORDER — SODIUM CHLORIDE 0.9 % IV SOLN
INTRAVENOUS | Status: DC
  Administered 2015-12-30: 75 mL via INTRAVENOUS
  Administered 2015-12-30: 03:00:00 via INTRAVENOUS

## 2015-12-30 NOTE — Progress Notes (Signed)
Hypoglycemic Event  CBG: 49 @ 07:51  Treatment: 15 GM carbohydrate snack  Symptoms: None  Follow-up CBG: Time: 08:13 CBG Result:101  Possible Reasons for Event: Inadequate meal intake  Comments/MD notified:    Adele Dan

## 2015-12-30 NOTE — Plan of Care (Signed)
Problem: Education: Goal: Knowledge of disease or condition will improve Outcome: Completed/Met Date Met:  12/30/15 Education provided regarding diagnosis, risk and prevention.

## 2015-12-30 NOTE — Progress Notes (Signed)
PROGRESS NOTE  Jared Murray  [id number] DOB: 12/06/1975 DOA: 12/29/2015 PCP: [name], MD Outpatient Specialists:  Subjective: Feels much better, denies any nausea vomiting this morning.  Brief Narrative:  40 year old male presented with nausea, vomiting and diarrhea was found to have DKA  Assessment & Plan:   Principal Problem:   Diabetic ketoacidosis without coma associated with type 1 diabetes mellitus ([place]) Active Problems:   AKI (acute kidney injury) ([place])   Nausea & vomiting   DKA (diabetic ketoacidoses) ([place])   Diabetic ketoacidosis Presented with complaints of nausea vomiting and diarrhea. Has history of insulin-dependent diabetes. ED lab work showed glucose of 4:15, anion gap of 27 and bicarbonate of 15. Along with presence of ketones. Admitted to stepdown treated with intensive IV insulin therapy. Ketoacidosis resolved, patient back to subcutaneous insulin  Nausea/vomiting/diarrhea Multiple episodes of nausea, vomiting, diarrhea. He thinks he may have had sick contacts this past week.  I suspect symptoms could be related to infectious gastroenteritis. Infection could have precipitated DKA. Although he has had previous hospitalizations for DKA in setting of noncompliance, he reported adherence to insulin this time.  Acute kidney injury Lab work showing elevated creatinine 1.39, this is resolved with IV fluid hydration creatinine 0.6 today.  Hypokalemia Repleted with oral supplements   DVT prophylaxis:  Code Status: Full Code Family Communication:  Disposition Plan:  Diet: Diet Carb Modified Fluid consistency:: Thin; Room service appropriate?: Yes  Consultants:   None  Procedures:   None  Antimicrobials:   None   Objective: Filed Vitals:   12/30/15 0431 12/30/15 0600 12/30/15 0700 12/30/15 0800  BP:  100/64  126/80  Pulse:  81 75 73  Temp: 98.1 F (36.7 C)     TempSrc: Oral     Resp:  15 15 14   Height:      Weight:      SpO2:   100% 100% 96%    Intake/Output Summary (Last 24 hours) at 12/30/15 0953 Last data filed at 12/30/15 0800  Gross per 24 hour  Intake 1520.66 ml  Output      0 ml  Net 1520.66 ml   Filed Weights   12/29/15 1755  Weight: 55 kg (121 lb 4.1 oz)    Examination: General exam: Appears calm and comfortable  Respiratory system: Clear to auscultation. Respiratory effort normal. Cardiovascular system: S1 & S2 heard, RRR. No JVD, murmurs, rubs, gallops or clicks. No pedal edema. Gastrointestinal system: Abdomen is nondistended, soft and nontender. No organomegaly or masses felt. Normal bowel sounds heard. Central nervous system: Alert and oriented. No focal neurological deficits. Extremities: Symmetric 5 x 5 power. Skin: No rashes, lesions or ulcers Psychiatry: Judgement and insight appear normal. Mood & affect appropriate.   Data Reviewed: I have personally reviewed following labs and imaging studies  CBC:  Recent Labs Lab 12/29/15 1432  WBC 11.3*  NEUTROABS 9.4*  HGB 17.6*  HCT 50.4  MCV 94.6  PLT [id number]   Basic Metabolic Panel:  Recent Labs Lab 12/29/15 1432 12/29/15 1800 12/29/15 2121 12/30/15 0139  NA 141 140 143 141  K 4.3 4.1 3.9 3.3*  CL 99* 102 107 108  CO2 15* 22 26 24   GLUCOSE 415* 248* 109* 108*  BUN 19 16 13 12   CREATININE 1.39* 1.07 0.77 0.68  CALCIUM 9.6 8.7* 9.0 8.7*   GFR: Estimated Creatinine Clearance: 96.4 mL/min (by C-G formula based on Cr of 0.68). Liver Function Tests:  Recent Labs Lab 12/29/15 1432  AST 44*  ALT 30  ALKPHOS 167*  BILITOT 1.8*  PROT 8.5*  ALBUMIN 5.3*   No results for input(s): LIPASE, AMYLASE in the last 168 hours. No results for input(s): AMMONIA in the last 168 hours. Coagulation Profile: No results for input(s): INR, PROTIME in the last 168 hours. Cardiac Enzymes: No results for input(s): CKTOTAL, CKMB, CKMBINDEX, TROPONINI in the last 168 hours. BNP (last 3 results) No results for input(s): PROBNP in the last  8760 hours. HbA1C: No results for input(s): HGBA1C in the last 72 hours. CBG:  Recent Labs Lab 12/30/15 0003 12/30/15 0111 12/30/15 0207 12/30/15 0751 12/30/15 0813  GLUCAP 121* 113* 102* 49* 101*   Lipid Profile: No results for input(s): CHOL, HDL, LDLCALC, TRIG, CHOLHDL, LDLDIRECT in the last 72 hours. Thyroid Function Tests: No results for input(s): TSH, T4TOTAL, FREET4, T3FREE, THYROIDAB in the last 72 hours. Anemia Panel: No results for input(s): VITAMINB12, FOLATE, FERRITIN, TIBC, IRON, RETICCTPCT in the last 72 hours. Urine analysis:    Component Value Date/Time   COLORURINE YELLOW 12/29/2015 1411   APPEARANCEUR CLEAR 12/29/2015 1411   LABSPEC 1.028 12/29/2015 1411   PHURINE 5.0 12/29/2015 1411   GLUCOSEU >1000* 12/29/2015 1411   HGBUR NEGATIVE 12/29/2015 1411   BILIRUBINUR NEGATIVE 12/29/2015 1411   BILIRUBINUR neg 03/16/2015 1230   KETONESUR >80* 12/29/2015 1411   PROTEINUR NEGATIVE 12/29/2015 1411   PROTEINUR neg 03/16/2015 1230   UROBILINOGEN 0.2 06/25/2015 0913   UROBILINOGEN 0.2 03/16/2015 1230   NITRITE NEGATIVE 12/29/2015 1411   NITRITE neg 03/16/2015 1230   LEUKOCYTESUR NEGATIVE 12/29/2015 1411   Sepsis Labs: @LABRCNTIP (procalcitonin:4,lacticidven:4)  ) Recent Results (from the past 240 hour(s))  MRSA PCR Screening     Status: None   Collection Time: 12/29/15  5:59 PM  Result Value Ref Range Status   MRSA by PCR NEGATIVE NEGATIVE Final    Comment:        The GeneXpert MRSA Assay (FDA approved for NASAL specimens only), is one component of a comprehensive MRSA colonization surveillance program. It is not intended to diagnose MRSA infection nor to guide or monitor treatment for MRSA infections.      Invalid input(s): PROCALCITONIN, [place]   Radiology Studies: No results found.      Scheduled Meds: . busPIRone  10 mg Oral TID  . enoxaparin (LOVENOX) injection  40 mg Subcutaneous Q24H  . insulin aspart  0-9 Units  Subcutaneous TID WC  . insulin glargine  25 Units Subcutaneous QHS   Continuous Infusions: . sodium chloride 100 mL/hr at 12/30/15 0233     LOS: 1 day    Time spent: 35 minutes    Adana Marik A, MD [facility] Pager [phone]  If 7PM-7AM, please contact night-coverage [email or web address] Password [facility] 12/30/2015, 9:53 AM

## 2015-12-31 LAB — CBC
HEMATOCRIT: 40.9 % (ref 39.0–52.0)
Hemoglobin: 14.2 g/dL (ref 13.0–17.0)
MCH: 31.8 pg (ref 26.0–34.0)
MCHC: 34.7 g/dL (ref 30.0–36.0)
MCV: 91.5 fL (ref 78.0–100.0)
Platelets: 281 10*3/uL (ref 150–400)
RBC: 4.47 MIL/uL (ref 4.22–5.81)
RDW: 12.2 % (ref 11.5–15.5)
WBC: 5.7 10*3/uL (ref 4.0–10.5)

## 2015-12-31 LAB — GLUCOSE, CAPILLARY: Glucose-Capillary: 61 mg/dL — ABNORMAL LOW (ref 65–99)

## 2015-12-31 LAB — BASIC METABOLIC PANEL
Anion gap: 9 (ref 5–15)
CHLORIDE: 104 mmol/L (ref 101–111)
CO2: 25 mmol/L (ref 22–32)
Calcium: 8.5 mg/dL — ABNORMAL LOW (ref 8.9–10.3)
Creatinine, Ser: 0.48 mg/dL — ABNORMAL LOW (ref 0.61–1.24)
GFR calc Af Amer: >60 mL/min (ref >60)
GFR calc non Af Amer: >60 mL/min (ref >60)
GLUCOSE: 63 mg/dL — AB (ref 65–99)
POTASSIUM: 3.2 mmol/L — AB (ref 3.5–5.1)
SODIUM: 138 mmol/L (ref 135–145)

## 2015-12-31 MED ORDER — POTASSIUM CHLORIDE CRYS ER 20 MEQ PO TBCR
40.0000 meq | EXTENDED_RELEASE_TABLET | Freq: Four times a day (QID) | ORAL | Status: DC
  Administered 2015-12-31: 40 meq via ORAL
  Filled 2015-12-31: qty 2

## 2015-12-31 MED ORDER — INSULIN GLARGINE 100 UNIT/ML ~~LOC~~ SOLN: 24.0000 [IU] | Freq: Every day | SUBCUTANEOUS | Status: DC

## 2015-12-31 MED ORDER — NOVOLOG 100 UNIT/ML ~~LOC~~ SOLN: SUBCUTANEOUS | Status: DC

## 2015-12-31 NOTE — Discharge Summary (Signed)
Physician Discharge Summary  SKYLIN ROOME [id number] DOB: [birth year] DOA: 12/29/2015  PCP: [name], MD  Admit date: 12/29/2015 Discharge date: 12/31/2015  Time spent: 40 minutes  Recommendations for Outpatient Follow-up:  1. Follow-up with primary care physician within one week. 2. Check BMP, follow potassium level in 1 week.   Discharge Diagnoses:  Principal Problem:   Diabetic ketoacidosis without coma associated with type 1 diabetes mellitus ([place]) Active Problems:   AKI (acute kidney injury) ([place])   Nausea & vomiting   DKA (diabetic ketoacidoses) ([place])   Discharge Condition: Stable  Diet recommendation: Heart healthy  Filed Weights   12/29/15 1755  Weight: 55 kg (121 lb 4.1 oz)    History of present illness:  Jared Murray is a 40 y.o. male with medical history significant of type 1 diabetes mellitus, having a history of requiring hospitalizations for DKA, was last hospitalized from 11/02/2015 through 11/04/2015 for diabetic ketoacidosis. He reports missing his insulin yesterday. Overnight developed multiple episodes of nausea, vomiting and diarrhea characterized as nonbloody. He also reports generalized weakness, malaise, feeling ill. He thinks he may have had sick contacts recently. He denies fevers, chills, chest pain, shortness of breath.   ED Course: In the emergency department lab work revealed blood sugar of 415 with an anion gap of 27, bicarbonate 15 and the presence of ketones on urinalysis. He was started on IV insulin and IV fluids.  Hospital Course:   Diabetic ketoacidosis Presented with complaints of nausea vomiting and diarrhea. Has history of insulin-dependent diabetes. ED lab work showed glucose of 415, anion gap of 27 and bicarbonate of 15. Along with presence of ketones. Initially admitted to stepdown and treated with intensive IV insulin therapy. Ketoacidosis resolved, patient back to subcutaneous insulin.  Diabetes mellitus type 2,  insulin-dependent uncontrolled Hemoglobin A1c on 11/03/2015 was 13.5 which correlate with mean plasma glucose of 351. Although patient reported compliance with insulin I think he still has adherence issues. In the hospital his Lantus restarted 25 units and he developed mild hypoglycemia with blood sugar of 61. I spoke in length with the patient about adherence to his insulin, he was unclear about his sliding scale. Placed on NovoLog 5 units with breakfast 7 units with both lunch and dinner, Lantus insulin decreased to 24 units.  Nausea/vomiting/diarrhea Multiple episodes of nausea, vomiting, diarrhea. He thinks he may have had sick contacts this past week.  I suspect symptoms could be related to infectious gastroenteritis. Infection could have precipitated DKA. Although he has had previous hospitalizations for DKA in setting of noncompliance.  Acute kidney injury Lab work showing elevated creatinine 1.39, this is resolved with IV fluid hydration creatinine 0.6 today.  Hypokalemia In the morning of discharge potassium still 3.2, he received total of 80 mEq of potassium prior to discharge.  Procedures:  None  Consultations:  None  Discharge Exam: Filed Vitals:   12/31/15 0237 12/31/15 0555  BP: 126/89 127/88  Pulse: 84 86  Temp: 98.4 F (36.9 C) 98.1 F (36.7 C)  Resp: 14 16  General: Alert and awake, oriented x3, not in any acute distress. HEENT: anicteric sclera, pupils reactive to light and accommodation, EOMI CVS: S1-S2 clear, no murmur rubs or gallops Chest: clear to auscultation bilaterally, no wheezing, rales or rhonchi Abdomen: soft nontender, nondistended, normal bowel sounds, no organomegaly Extremities: no cyanosis, clubbing or edema noted bilaterally Neuro: Cranial nerves II-XII intact, no focal neurological deficits  Discharge Instructions   Discharge Instructions    Diet -  low sodium heart healthy    Complete by:  As directed      Increase activity slowly     Complete by:  As directed           Current Discharge Medication List    CONTINUE these medications which have CHANGED   Details  insulin glargine (LANTUS) 100 UNIT/ML injection Inject 0.24 mLs (24 Units total) into the skin at bedtime. 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 Qty: 60 tablet, Refills: 1   Associated Diagnoses: Avascular necrosis of bone of hip, unspecified laterality (HCC)    busPIRone (BUSPAR) 10 MG tablet Take 1 tablet (10 mg total) by mouth 3 (three) times daily. Qty: 90 tablet, Refills: 3   Associated Diagnoses: Generalized anxiety disorder    clobetasol cream (TEMOVATE) [id number] % Apply 1 application topically 2 (two) times daily. Qty: 30 g, Refills: 2   Associated Diagnoses: Eczema    pantoprazole (PROTONIX) 40 MG tablet Take 1 tablet (40 mg total) by mouth daily. Qty: 30 tablet, Refills: 2    Blood Glucose Monitoring Suppl (ACCU-CHEK AVIVA) device Use as instructed Qty: 1 each, Refills: 0    glucose blood test strip Accu-chek aviva. Check blood sugar 3 times before meals and at bedtime Qty: 120 each, Refills: 5   Associated Diagnoses: Type 1 diabetes mellitus with complication (HCC)    Insulin Syringe-Needle U-100 (B-D INS SYRINGE 2CC/29GX1/2") 29G X 1/2" 2 ML MISC Check Blood sugar TID and QHS Qty: 100 each, Refills: 12   Associated Diagnoses: Type II or unspecified type diabetes mellitus without mention of complication, uncontrolled    Lancet Devices (ACCU-CHEK  SOFTCLIX) lancets Use as instructed three times daily. 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 Qty: 28 patch, Refills: 1   Associated Diagnoses: Tobacco use disorder    Olopatadine HCl 0.2 % SOLN Place 1 drop in each eye twice daily Qty: 2.5 mL, Refills: 2      STOP taking these medications     feeding supplement, GLUCERNA SHAKE, (GLUCERNA SHAKE) LIQD        Allergies  Allergen Reactions  . Latex Itching and Rash  . Penicillins Other (See Comments)    Childhood allergy Has patient had a PCN reaction causing immediate rash, facial/tongue/throat swelling, SOB or lightheadedness with hypotension: NO Has patient had a PCN reaction causing severe rash involving mucus membranes or skin necrosis:NO Has patient had a PCN reaction that required hospitalization NO Has patient had a PCN reaction occurring within the last 10 years: NO If all of the above answers are "NO", then may proceed with Cephalosporin use.   Follow-up Information    Follow up with [name], MD In 1 week.   Specialty:  Family Medicine   Contact information:   [place]  [place] [address] [phone]        The results of significant diagnostics from this hospitalization (including imaging, microbiology, ancillary and laboratory) are listed below for reference.    Significant Diagnostic Studies: No results found.  Microbiology: Recent Results (from the past 240 hour(s))  MRSA PCR Screening     Status: None   Collection Time: 12/29/15  5:59 PM  Result Value Ref Range Status   MRSA by PCR NEGATIVE NEGATIVE Final    Comment:        The GeneXpert MRSA Assay (FDA approved for NASAL specimens only), is one component of a comprehensive MRSA colonization surveillance program. It is not intended to diagnose  MRSA infection nor to guide or monitor treatment for MRSA infections.      Labs: Basic Metabolic Panel:  Recent Labs Lab 12/29/15 1432 12/29/15 1800 12/29/15 2121 12/30/15 0139 12/31/15 0409  NA 141 140 143 141 138  K 4.3 4.1 3.9 3.3* 3.2*  CL 99* 102 107 108 104  CO2 15* 22 26 24 25   GLUCOSE 415* 248* 109* 108* 63*  BUN 19 16 13 12  <5*  CREATININE 1.39* 1.07 0.77 0.68 0.48*  CALCIUM 9.6 8.7* 9.0 8.7* 8.5*   Liver Function Tests:  Recent Labs Lab 12/29/15 1432  AST 44*  ALT 30  ALKPHOS 167*  BILITOT 1.8*  PROT 8.5*  ALBUMIN 5.3*   No results for input(s): LIPASE, AMYLASE in the last 168 hours. No results for input(s): AMMONIA in the last 168 hours. CBC:  Recent Labs Lab 12/29/15 1432 12/31/15 0409  WBC 11.3* 5.7  NEUTROABS 9.4*  --   HGB 17.6* 14.2  HCT 50.4 40.9  MCV 94.6 91.5  PLT 330 281   Cardiac Enzymes: No results for input(s): CKTOTAL, CKMB, CKMBINDEX, TROPONINI in the last 168 hours. BNP: BNP (last 3 results) No results for input(s): BNP in the last 8760 hours.  ProBNP (last 3 results) No results for input(s): PROBNP in the last 8760 hours.  CBG:  Recent Labs Lab 12/30/15 0813 12/30/15 1212 12/30/15 1713 12/30/15 2122 12/31/15 0810  GLUCAP 101* 188* 242* 99 61*       Signed:  Tanairi Cypert A MD.  [facility] 12/31/2015, 10:38 AM

## 2016-01-07 ENCOUNTER — Encounter: Payer: Self-pay | Admitting: Family Medicine

## 2016-01-07 ENCOUNTER — Ambulatory Visit: Payer: Medicaid Other | Attending: Family Medicine | Admitting: Family Medicine

## 2016-01-07 VITALS — BP 101/67 | HR 97 | Temp 98.2°F | Resp 16 | Ht 70.0 in | Wt 130.6 lb

## 2016-01-07 DIAGNOSIS — Z9114 Patient's other noncompliance with medication regimen: Secondary | ICD-10-CM

## 2016-01-07 DIAGNOSIS — Z791 Long term (current) use of non-steroidal anti-inflammatories (NSAID): Secondary | ICD-10-CM | POA: Diagnosis not present

## 2016-01-07 DIAGNOSIS — E10649 Type 1 diabetes mellitus with hypoglycemia without coma: Secondary | ICD-10-CM | POA: Diagnosis not present

## 2016-01-07 DIAGNOSIS — L409 Psoriasis, unspecified: Secondary | ICD-10-CM | POA: Diagnosis not present

## 2016-01-07 DIAGNOSIS — E1029 Type 1 diabetes mellitus with other diabetic kidney complication: Secondary | ICD-10-CM | POA: Diagnosis not present

## 2016-01-07 DIAGNOSIS — Z794 Long term (current) use of insulin: Secondary | ICD-10-CM | POA: Insufficient documentation

## 2016-01-07 DIAGNOSIS — Z79899 Other long term (current) drug therapy: Secondary | ICD-10-CM | POA: Diagnosis not present

## 2016-01-07 DIAGNOSIS — M879 Osteonecrosis, unspecified: Secondary | ICD-10-CM | POA: Insufficient documentation

## 2016-01-07 DIAGNOSIS — E1010 Type 1 diabetes mellitus with ketoacidosis without coma: Secondary | ICD-10-CM | POA: Insufficient documentation

## 2016-01-07 DIAGNOSIS — Z9119 Patient's noncompliance with other medical treatment and regimen: Secondary | ICD-10-CM | POA: Insufficient documentation

## 2016-01-07 LAB — GLUCOSE, POCT (MANUAL RESULT ENTRY): POC Glucose: 156 mg/dl — AB (ref 70–99)

## 2016-01-07 MED ORDER — NOVOLOG 100 UNIT/ML ~~LOC~~ SOLN: 0.0000 [IU] | Freq: Three times a day (TID) | SUBCUTANEOUS | Status: DC

## 2016-01-07 NOTE — Progress Notes (Signed)
Patient's here for month f/up DM.  Patient states he's having slight pain in his hips and legs that is manageable.

## 2016-01-07 NOTE — Patient Instructions (Signed)
Diabetes Mellitus and Food It is important for you to manage your blood sugar (glucose) level. Your blood glucose level can be greatly affected by what you eat. Eating healthier foods in the appropriate amounts throughout the day at about the same time each day will help you control your blood glucose level. It can also help slow or prevent worsening of your diabetes mellitus. Healthy eating may even help you improve the level of your blood pressure and reach or maintain a healthy weight.  General recommendations for healthful eating and cooking habits include:  Eating meals and snacks regularly. Avoid going long periods of time without eating to lose weight.  Eating a diet that consists mainly of plant-based foods, such as fruits, vegetables, nuts, legumes, and whole grains.  Using low-heat cooking methods, such as baking, instead of high-heat cooking methods, such as deep frying. Work with your dietitian to make sure you understand how to use the Nutrition Facts information on food labels. HOW CAN FOOD AFFECT ME? Carbohydrates Carbohydrates affect your blood glucose level more than any other type of food. Your dietitian will help you determine how many carbohydrates to eat at each meal and teach you how to count carbohydrates. Counting carbohydrates is important to keep your blood glucose at a healthy level, especially if you are using insulin or taking certain medicines for diabetes mellitus. Alcohol Alcohol can cause sudden decreases in blood glucose (hypoglycemia), especially if you use insulin or take certain medicines for diabetes mellitus. Hypoglycemia can be a life-threatening condition. Symptoms of hypoglycemia (sleepiness, dizziness, and disorientation) are similar to symptoms of having too much alcohol.  If your health care provider has given you approval to drink alcohol, do so in moderation and use the following guidelines:  Women should not have more than one drink per day, and men  should not have more than two drinks per day. One drink is equal to:  12 oz of beer.  5 oz of wine.  1 oz of hard liquor.  Do not drink on an empty stomach.  Keep yourself hydrated. Have water, diet soda, or unsweetened iced tea.  Regular soda, juice, and other mixers might contain a lot of carbohydrates and should be counted. WHAT FOODS ARE NOT RECOMMENDED? As you make food choices, it is important to remember that all foods are not the same. Some foods have fewer nutrients per serving than other foods, even though they might have the same number of calories or carbohydrates. It is difficult to get your body what it needs when you eat foods with fewer nutrients. Examples of foods that you should avoid that are high in calories and carbohydrates but low in nutrients include:  Trans fats (most processed foods list trans fats on the Nutrition Facts label).  Regular soda.  Juice.  Candy.  Sweets, such as cake, pie, doughnuts, and cookies.  Fried foods. WHAT FOODS CAN I EAT? Eat nutrient-rich foods, which will nourish your body and keep you healthy. The food you should eat also will depend on several factors, including:  The calories you need.  The medicines you take.  Your weight.  Your blood glucose level.  Your blood pressure level.  Your cholesterol level. You should eat a variety of foods, including:  Protein.  Lean cuts of meat.  Proteins low in saturated fats, such as fish, egg whites, and beans. Avoid processed meats.  Fruits and vegetables.  Fruits and vegetables that may help control blood glucose levels, such as apples, mangoes, and   yams.  Dairy products.  Choose fat-free or low-fat dairy products, such as milk, yogurt, and cheese.  Grains, bread, pasta, and rice.  Choose whole grain products, such as multigrain bread, whole oats, and brown rice. These foods may help control blood pressure.  Fats.  Foods containing healthful fats, such as nuts,  avocado, olive oil, canola oil, and fish. DOES EVERYONE WITH DIABETES MELLITUS HAVE THE SAME MEAL PLAN? Because every person with diabetes mellitus is different, there is not one meal plan that works for everyone. It is very important that you meet with a dietitian who will help you create a meal plan that is just right for you.   This information is not intended to replace advice given to you by your health care provider. Make sure you discuss any questions you have with your health care provider.   Document Released: 05/01/2005 Document Revised: 08/25/2014 Document Reviewed: 07/01/2013 Elsevier Interactive Patient Education 2016 Elsevier Inc.  

## 2016-01-07 NOTE — Progress Notes (Signed)
Subjective:  Patient ID: Jared Murray, male    DOB: [birth year]  Age: 40 y.o. MRN: [id number]  CC: Follow-up and Diabetes   HPI Jared Murray is a 40 with a history of uncontrolled type 1 diabetes mellitus (A1c 13.5), noncompliance, multiple hospital admissions for DKA, avascular necrosis of both hips, psoriasis here for follow-up visit.  He had a recent hospitalization at [facility] from 12/29/15 through 12/31/15 for DKA after he had presented with blood sugars in the 400s (he had gastroenteritis and had skipped his night dose of Lantus), iron gap of 27, bicarbonate 15 and ketonuria. He was placed on IV fluids, IV insulin with resulting improvement in blood sugars after which he was transitioned to subcutaneous insulin. His condition improved and he was discharged on 24 units of Lantus and a fixed regimen of NovoLog ( 5 units before breakfast, 7 units before lunch and dinner).  Interval History; He has his blood sugar log with him today which reveals fasting sugars in the 57-125 range and random sugars of 112 -348 and he has been taking 18 units of Lantus (he reduced his dose by 2 units by the day due to hypoglycemia of up to 49 and he says). He has also not been taking the fixed regimen of NovoLog but has been using the sliding scale. He  still endorses dietary indiscretion and eats high carb meals and informs me that he knows he has to work on his diet and will do better.  Outpatient Prescriptions Prior to Visit  Medication Sig Dispense Refill  . acetaminophen-codeine (TYLENOL #3) 300-30 MG tablet Take 1 tablet by mouth every 8 (eight) hours as needed for moderate pain. 60 tablet 1  . Blood Glucose Monitoring Suppl (ACCU-CHEK AVIVA) device Use as instructed 1 each 0  . busPIRone (BUSPAR) 10 MG tablet Take 1 tablet (10 mg total) by mouth 3 (three) times daily. (Patient taking differently: Take 10 mg by mouth daily as needed (anxiety). ) 90 tablet 3  . clobetasol cream  (TEMOVATE) [id number] % Apply 1 application topically 2 (two) times daily. 30 g 2  . glucose blood test strip Accu-chek aviva. Check blood sugar 3 times before meals and at bedtime 120 each 5  . insulin glargine (LANTUS) 100 UNIT/ML injection Inject 0.24 mLs (24 Units total) into the skin at bedtime. 10 mL 5  . Insulin Syringe-Needle U-100 (B-D INS SYRINGE 2CC/29GX1/2") 29G X 1/2" 2 ML MISC Check Blood sugar TID and QHS 100 each 12  . Lancet Devices (ACCU-CHEK SOFTCLIX) lancets Use as instructed three times daily. 1 each 5  . nicotine (NICODERM CQ - DOSED IN MG/24 HOURS) 14 mg/24hr patch Place 1 patch (14 mg total) onto the skin daily. 28 patch 1  . pantoprazole (PROTONIX) 40 MG tablet Take 1 tablet (40 mg total) by mouth daily. 30 tablet 2  . NOVOLOG 100 UNIT/ML injection 5 units subcutaneous before breakfast, 7 units before lunch and 7 units before dinner. 2 vial 6  . Olopatadine HCl 0.2 % SOLN Place 1 drop in each eye twice daily (Patient not taking: Reported on 12/29/2015) 2.5 mL 2   No facility-administered medications prior to visit.    ROS Review of Systems Constitutional: Negative for activity change and appetite change.  HENT: Negative for sinus pressure and sore throat.   Eyes: Negative for visual disturbance.  Respiratory: Negative for cough, chest tightness and shortness of breath.   Cardiovascular: Negative for chest pain and leg swelling.  Gastrointestinal:  Negative for abdominal pain, diarrhea, constipation and abdominal distention.  Endocrine: Negative.   Genitourinary: Negative for dysuria.  Musculoskeletal: Negative for myalgias and joint swelling.       Bilateral hip pain  Skin: Positive for rash (Psoriatic lesions on body.), hypopigmented lesions on hands Allergic/Immunologic: Negative.   Neurological: Negative for weakness, light-headedness and numbness.  Psychiatric/Behavioral: Negative for suicidal ideas and dysphoric mood.   Objective:  BP 101/67 mmHg  Pulse 97   Temp(Src) 98.2 F (36.8 C) (Oral)  Resp 16  Ht 5\' 10"  (1.778 m)  Wt 130 lb 9.6 oz (59.24 kg)  BMI 18.74 kg/m2  SpO2 98%  BP/Weight 01/07/2016 12/31/2015 0000000  Systolic BP 99991111 [id number] -  Diastolic BP 67 88 -  Wt. (Lbs) 130.6 - 121.25  BMI 18.74 - 17.4      Physical Exam Constitutional: He is oriented to person, place, and time. He appears well-developed and well-nourished.  Cardiovascular: Normal heart rate, heart sounds and intact distal pulses.    No murmur heard. Pulmonary/Chest: Effort normal and breath sounds normal. He has no wheezes. He has no rales. He exhibits no tenderness.  Abdominal: Soft. Bowel sounds are normal. He exhibits no distension and no mass. There is no tenderness.  Musculoskeletal: mild tenderness on ROM of hips  Neurological: He is alert and oriented to person, place, and time.  Skin:  Hypopigmented patches on dorsum of both hands, psoriatic lesions diffusely distributed on body surface.  Psychiatric:  Normal mood  Assessment & Plan:   1. Diabetic ketoacidosis without coma associated with type 1 diabetes mellitus ([place]) Controlled with A1c of 13.5 from 10/2015 due to noncompliance Scheduled to see a registered dietitian at the end of the week He does make poor dietary choices which is also largely contributing to the poor control He is currently on 18 units of Lantus at bedtime and has been advised to remain on the NovoLog sliding scale past at 60 was regimen of NovoLog has produced some hypoglycemia. Glucose (CBG)  2. Non compliance w medication regimen Discussed implications of poor care aherence and he states he is willing to be more compliant just like he says that all that time with no change in lifestyle.   3. Type 1 diabetes mellitus with other kidney complication (HCC)  NOVOLOG 100 UNIT/ML injection; Inject 0-12 Units into the skin 3 (three) times daily with meals. As per sliding scale  Dispense: 2 vial; Refill: 6   Meds ordered this  encounter  Medications  . NOVOLOG 100 UNIT/ML injection    Sig: Inject 0-12 Units into the skin 3 (three) times daily with meals. As per sliding scale    Dispense:  2 vial    Refill:  6    Please dispense as vial. Thanks    Follow-up: Return in about 2 weeks (around 01/21/2016) for follow up of Diabetes Mellitus.   [name] MD

## 2016-01-21 ENCOUNTER — Ambulatory Visit: Payer: Medicaid Other | Attending: Family Medicine | Admitting: Family Medicine

## 2016-01-21 ENCOUNTER — Encounter: Payer: Self-pay | Admitting: Family Medicine

## 2016-01-21 VITALS — BP 121/82 | HR 91 | Temp 98.3°F | Resp 18 | Ht 70.0 in | Wt 131.0 lb

## 2016-01-21 DIAGNOSIS — M879 Osteonecrosis, unspecified: Secondary | ICD-10-CM | POA: Diagnosis not present

## 2016-01-21 DIAGNOSIS — M87059 Idiopathic aseptic necrosis of unspecified femur: Secondary | ICD-10-CM | POA: Diagnosis not present

## 2016-01-21 DIAGNOSIS — E876 Hypokalemia: Secondary | ICD-10-CM | POA: Diagnosis not present

## 2016-01-21 DIAGNOSIS — Z794 Long term (current) use of insulin: Secondary | ICD-10-CM | POA: Diagnosis not present

## 2016-01-21 DIAGNOSIS — Z79899 Other long term (current) drug therapy: Secondary | ICD-10-CM | POA: Diagnosis not present

## 2016-01-21 DIAGNOSIS — E1029 Type 1 diabetes mellitus with other diabetic kidney complication: Secondary | ICD-10-CM | POA: Insufficient documentation

## 2016-01-21 LAB — BASIC METABOLIC PANEL
BUN: 7 mg/dL (ref 7–25)
CALCIUM: 9.3 mg/dL (ref 8.6–10.3)
CO2: 19 mmol/L — AB (ref 20–31)
Chloride: 101 mmol/L (ref 98–110)
Creat: 0.59 mg/dL — ABNORMAL LOW (ref 0.60–1.35)
GLUCOSE: 297 mg/dL — AB (ref 65–99)
Potassium: 4.1 mmol/L (ref 3.5–5.3)
SODIUM: 134 mmol/L — AB (ref 135–146)

## 2016-01-21 LAB — POCT GLYCOSYLATED HEMOGLOBIN (HGB A1C): Hemoglobin A1C: 10.5

## 2016-01-21 LAB — GLUCOSE, POCT (MANUAL RESULT ENTRY): POC Glucose: 388 mg/dl — AB (ref 70–99)

## 2016-01-21 MED ORDER — INSULIN GLARGINE 100 UNIT/ML ~~LOC~~ SOLN: 20.0000 [IU] | Freq: Every day | SUBCUTANEOUS | Status: DC

## 2016-01-21 NOTE — Progress Notes (Signed)
Patient is here for FU DM  Patient has taken 18 units of novolog this morning at 9 am. Patient has not eaten.  Patient complains of left hip pain which requires surgery.

## 2016-01-21 NOTE — Progress Notes (Signed)
Subjective:  Patient ID: Jared Murray, male    DOB: 1976-02-29  Age: 40 y.o. MRN: [id number]  CC: No chief complaint on file.   HPI Jared Murray is a 40 year old male with a history of uncontrolled type 1 diabetes mellitus (A1c 10.5 from today), noncompliance, multiple hospital admissions for DKA, avascular necrosis of both hips, psoriasis who comes into the clinic for a follow-up visit. His blood sugar is elevated at 388 and he admits to forgetting to take his Lantus last night but did take NovoLog this morning and also his missed dose of Lantus.  He was seen by orthopedics for his avascular necrosis of the hip and surgery is planned for next month but he was informed his A1c would have to be at 7. He was also recently seen by [facility] and will be scheduled for nutrition classes.  Outpatient Prescriptions Prior to Visit  Medication Sig Dispense Refill  . acetaminophen-codeine (TYLENOL #3) 300-30 MG tablet Take 1 tablet by mouth every 8 (eight) hours as needed for moderate pain. 60 tablet 1  . Blood Glucose Monitoring Suppl (ACCU-CHEK AVIVA) device Use as instructed 1 each 0  . busPIRone (BUSPAR) 10 MG tablet Take 1 tablet (10 mg total) by mouth 3 (three) times daily. (Patient taking differently: Take 10 mg by mouth daily as needed (anxiety). ) 90 tablet 3  . clobetasol cream (TEMOVATE) [id number] % Apply 1 application topically 2 (two) times daily. 30 g 2  . glucose blood test strip Accu-chek aviva. Check blood sugar 3 times before meals and at bedtime 120 each 5  . hydrocortisone 2.5 % cream Apply topically 2 (two) times daily.    . Insulin Syringe-Needle U-100 (B-D INS SYRINGE 2CC/29GX1/2") 29G X 1/2" 2 ML MISC Check Blood sugar TID and QHS 100 each 12  . Lancet Devices (ACCU-CHEK SOFTCLIX) lancets Use as instructed three times daily. 1 each 5  . NOVOLOG 100 UNIT/ML injection Inject 0-12 Units into the skin 3 (three) times daily with meals. As per sliding scale 2 vial 6  . pantoprazole  (PROTONIX) 40 MG tablet Take 1 tablet (40 mg total) by mouth daily. 30 tablet 2  . insulin glargine (LANTUS) 100 UNIT/ML injection Inject 0.24 mLs (24 Units total) into the skin at bedtime. 10 mL 5  . nicotine (NICODERM CQ - DOSED IN MG/24 HOURS) 14 mg/24hr patch Place 1 patch (14 mg total) onto the skin daily. (Patient not taking: Reported on 01/21/2016) 28 patch 1  . Olopatadine HCl 0.2 % SOLN Place 1 drop in each eye twice daily (Patient not taking: Reported on 01/21/2016) 2.5 mL 2   No facility-administered medications prior to visit.    ROS Review of Systems Constitutional: Negative for activity change and appetite change.  HENT: Negative for sinus pressure and sore throat.   Eyes: Negative for visual disturbance.  Respiratory: Negative for cough, chest tightness and shortness of breath.   Cardiovascular: Negative for chest pain and leg swelling.  Gastrointestinal: Negative for abdominal pain, diarrhea, constipation and abdominal distention.  Endocrine: Negative.   Genitourinary: Negative for dysuria.  Musculoskeletal: Negative for myalgias and joint swelling.       Bilateral hip pain  Skin: Positive for rash (Psoriatic lesions on body.), hypopigmented lesions on hands Allergic/Immunologic: Negative.   Neurological: Negative for weakness, light-headedness and numbness.  Psychiatric/Behavioral: Negative for suicidal ideas and dysphoric mood.   Objective:  BP 121/82 mmHg  Pulse 91  Temp(Src) 98.3 F (36.8 C) (Oral)  Resp 18  Ht 5\' 10"  (1.778 m)  Wt 131 lb (59.421 kg)  BMI 18.80 kg/m2  SpO2 98%  BP/Weight 01/21/2016 01/07/2016 [id number]  Systolic BP 123XX123 [id number]  Diastolic BP 82 67 88  Wt. (Lbs) 131 130.6 -  BMI 18.8 18.74 -      Physical Exam Constitutional: He is oriented to person, place, and time. He appears well-developed and well-nourished.  Cardiovascular: Normal heart rate, heart sounds and intact distal pulses.    No murmur heard. Pulmonary/Chest: Effort normal and  breath sounds normal. He has no wheezes. He has no rales. He exhibits no tenderness.  Abdominal: Soft. Bowel sounds are normal. He exhibits no distension and no mass. There is no tenderness.  Musculoskeletal: mild tenderness on ROM of hips  Neurological: He is alert and oriented to person, place, and time.  Skin:  Hypopigmented patches on dorsum of both hands, psoriatic lesions diffusely distributed on body surface.  Psychiatric:  Normal mood  Assessment & Plan:   1. 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 Closely followed by [facility] and will be attending nutrition classes. - POCT A1C - Glucose (CBG) - insulin glargine (LANTUS) 100 UNIT/ML injection; Inject 0.2 mLs (20 Units total) into the skin at bedtime.  Dispense: 10 mL; Refill: 5  2. Avascular necrosis of bone of hip, unspecified laterality ([place]) Seen by orthopedics and scheduled for surgery next month which will depend on glycemic control.  3. Hypokalemia - Basic Metabolic Panel   Meds ordered this encounter  Medications  . insulin glargine (LANTUS) 100 UNIT/ML injection    Sig: Inject 0.2 mLs (20 Units total) into the skin at bedtime.    Dispense:  10 mL    Refill:  5    Follow-up: Return in about 6 weeks (around 03/03/2016) for Follow-up on diabetes mellitus.   [name] MD

## 2016-01-21 NOTE — Patient Instructions (Signed)
Diabetes Mellitus and Food It is important for you to manage your blood sugar (glucose) level. Your blood glucose level can be greatly affected by what you eat. Eating healthier foods in the appropriate amounts throughout the day at about the same time each day will help you control your blood glucose level. It can also help slow or prevent worsening of your diabetes mellitus. Healthy eating may even help you improve the level of your blood pressure and reach or maintain a healthy weight.  General recommendations for healthful eating and cooking habits include:  Eating meals and snacks regularly. Avoid going long periods of time without eating to lose weight.  Eating a diet that consists mainly of plant-based foods, such as fruits, vegetables, nuts, legumes, and whole grains.  Using low-heat cooking methods, such as baking, instead of high-heat cooking methods, such as deep frying. Work with your dietitian to make sure you understand how to use the Nutrition Facts information on food labels. HOW CAN FOOD AFFECT ME? Carbohydrates Carbohydrates affect your blood glucose level more than any other type of food. Your dietitian will help you determine how many carbohydrates to eat at each meal and teach you how to count carbohydrates. Counting carbohydrates is important to keep your blood glucose at a healthy level, especially if you are using insulin or taking certain medicines for diabetes mellitus. Alcohol Alcohol can cause sudden decreases in blood glucose (hypoglycemia), especially if you use insulin or take certain medicines for diabetes mellitus. Hypoglycemia can be a life-threatening condition. Symptoms of hypoglycemia (sleepiness, dizziness, and disorientation) are similar to symptoms of having too much alcohol.  If your health care provider has given you approval to drink alcohol, do so in moderation and use the following guidelines:  Women should not have more than one drink per day, and men  should not have more than two drinks per day. One drink is equal to:  12 oz of beer.  5 oz of wine.  1 oz of hard liquor.  Do not drink on an empty stomach.  Keep yourself hydrated. Have water, diet soda, or unsweetened iced tea.  Regular soda, juice, and other mixers might contain a lot of carbohydrates and should be counted. WHAT FOODS ARE NOT RECOMMENDED? As you make food choices, it is important to remember that all foods are not the same. Some foods have fewer nutrients per serving than other foods, even though they might have the same number of calories or carbohydrates. It is difficult to get your body what it needs when you eat foods with fewer nutrients. Examples of foods that you should avoid that are high in calories and carbohydrates but low in nutrients include:  Trans fats (most processed foods list trans fats on the Nutrition Facts label).  Regular soda.  Juice.  Candy.  Sweets, such as cake, pie, doughnuts, and cookies.  Fried foods. WHAT FOODS CAN I EAT? Eat nutrient-rich foods, which will nourish your body and keep you healthy. The food you should eat also will depend on several factors, including:  The calories you need.  The medicines you take.  Your weight.  Your blood glucose level.  Your blood pressure level.  Your cholesterol level. You should eat a variety of foods, including:  Protein.  Lean cuts of meat.  Proteins low in saturated fats, such as fish, egg whites, and beans. Avoid processed meats.  Fruits and vegetables.  Fruits and vegetables that may help control blood glucose levels, such as apples, mangoes, and   yams.  Dairy products.  Choose fat-free or low-fat dairy products, such as milk, yogurt, and cheese.  Grains, bread, pasta, and rice.  Choose whole grain products, such as multigrain bread, whole oats, and brown rice. These foods may help control blood pressure.  Fats.  Foods containing healthful fats, such as nuts,  avocado, olive oil, canola oil, and fish. DOES EVERYONE WITH DIABETES MELLITUS HAVE THE SAME MEAL PLAN? Because every person with diabetes mellitus is different, there is not one meal plan that works for everyone. It is very important that you meet with a dietitian who will help you create a meal plan that is just right for you.   This information is not intended to replace advice given to you by your health care provider. Make sure you discuss any questions you have with your health care provider.   Document Released: 05/01/2005 Document Revised: 08/25/2014 Document Reviewed: 07/01/2013 Elsevier Interactive Patient Education 2016 Elsevier Inc.  

## 2016-02-08 ENCOUNTER — Other Ambulatory Visit: Payer: Self-pay | Admitting: Family Medicine

## 2016-03-15 ENCOUNTER — Other Ambulatory Visit: Payer: Self-pay | Admitting: Family Medicine

## 2016-03-15 DIAGNOSIS — E108 Type 1 diabetes mellitus with unspecified complications: Secondary | ICD-10-CM

## 2016-03-15 NOTE — Telephone Encounter (Signed)
Rx Request 

## 2016-03-21 IMAGING — CR DG CHEST 1V PORT
2 series · 2 of 2 positions shown · non-contrast
Comparison: 06/30/2014

CLINICAL DATA: Respiratory distress

EXAM:
PORTABLE CHEST - 1 VIEW

[AP (1 of 2)]
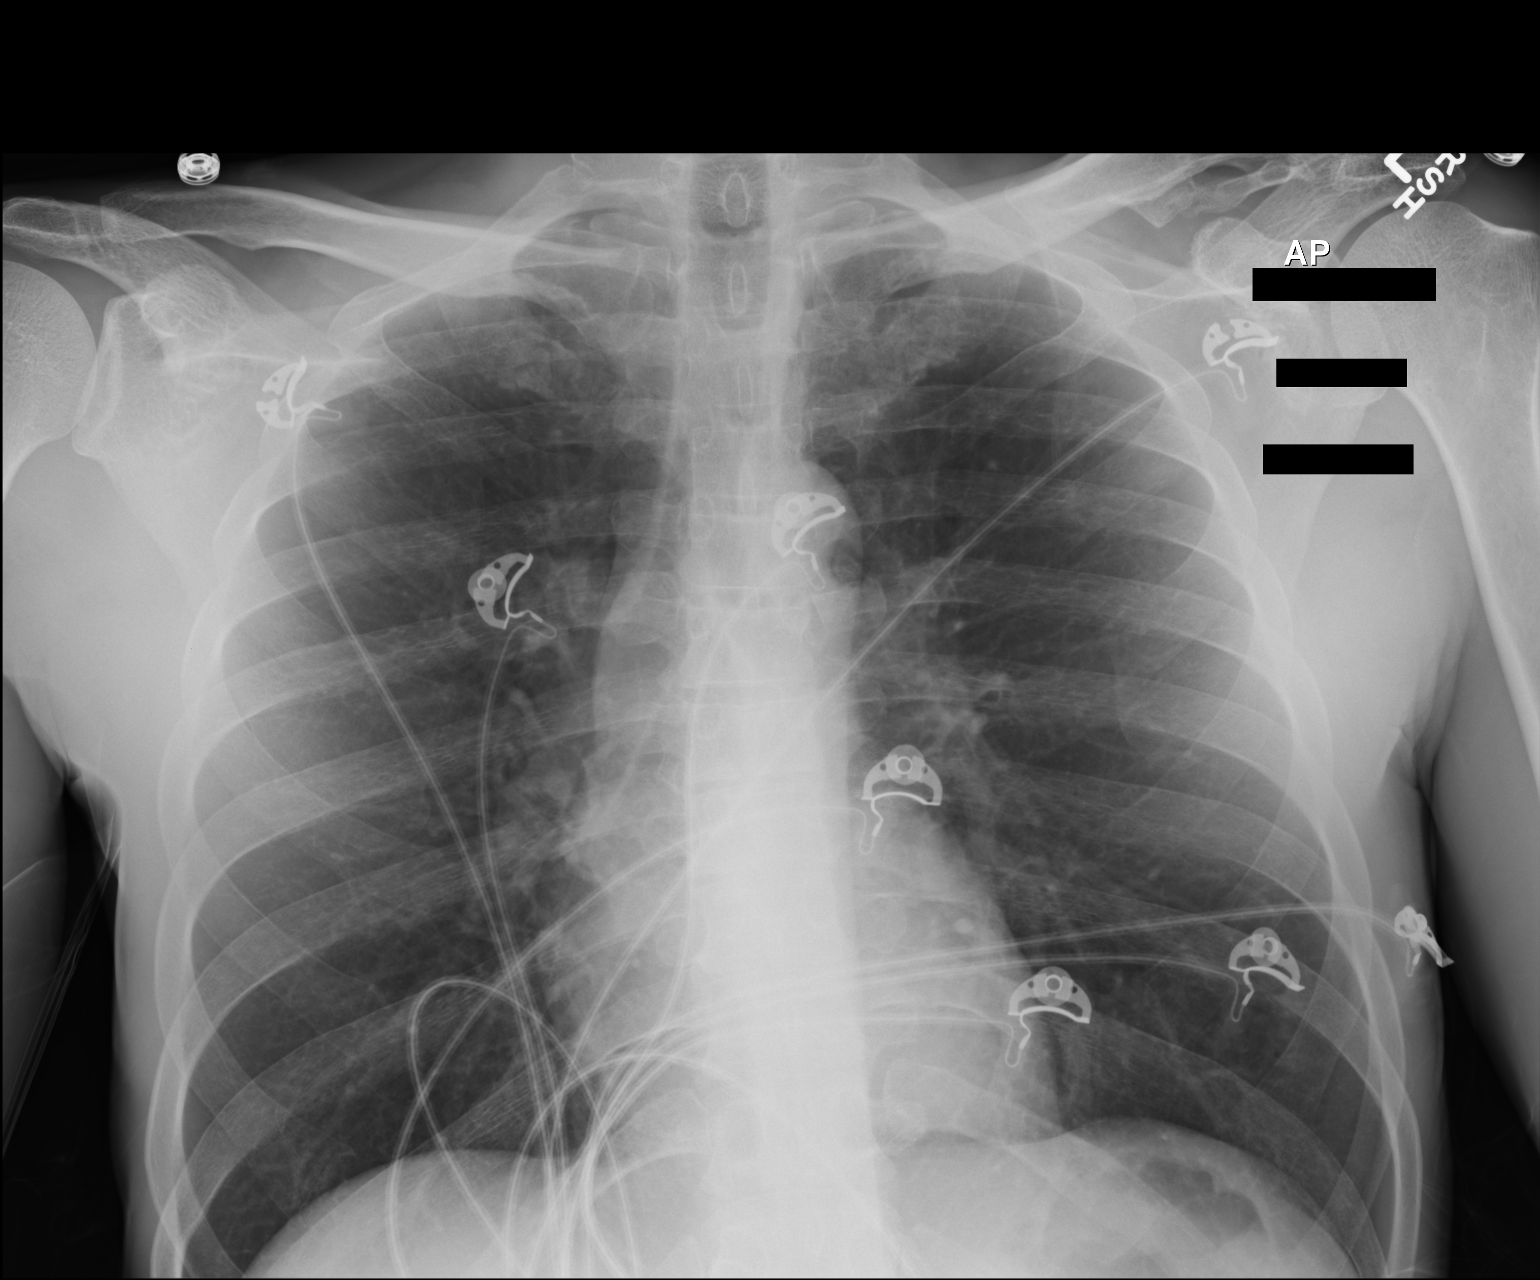

[AP (2 of 2)]
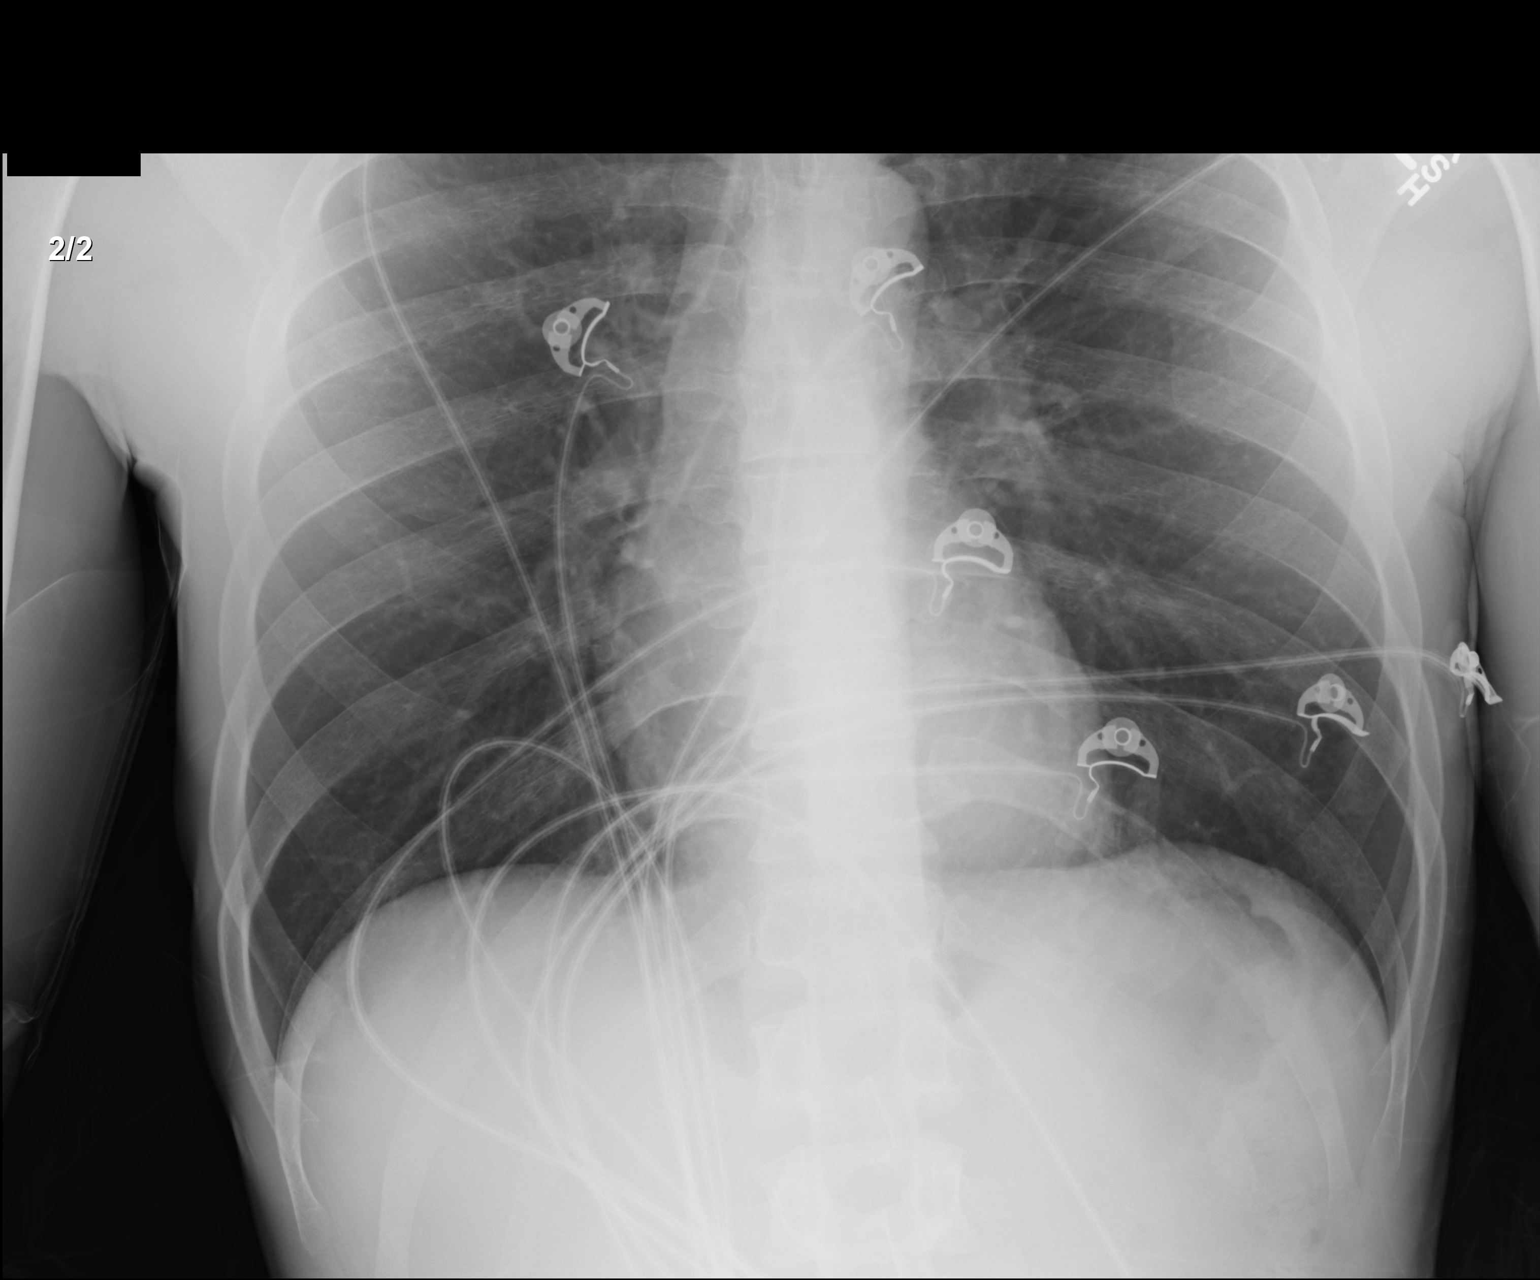

[2 of 2 positions shown; findings below may reference images not displayed]

FINDINGS: The heart size and mediastinal contours are within normal limits.
Both lungs are clear. The visualized skeletal structures are
unremarkable.
IMPRESSION: No active disease.

## 2016-03-27 ENCOUNTER — Other Ambulatory Visit: Payer: Self-pay | Admitting: Family Medicine

## 2016-03-27 DIAGNOSIS — M87059 Idiopathic aseptic necrosis of unspecified femur: Secondary | ICD-10-CM

## 2016-03-31 ENCOUNTER — Inpatient Hospital Stay (HOSPITAL_COMMUNITY)
Admission: EM | Admit: 2016-03-31 | Discharge: 2016-04-02 | DRG: 638 | Disposition: A | Discharge status: Home / Self Care | Payer: Medicaid Other | Attending: Internal Medicine | Admitting: Internal Medicine

## 2016-03-31 ENCOUNTER — Encounter (HOSPITAL_COMMUNITY): Payer: Self-pay | Admitting: *Deleted

## 2016-03-31 ENCOUNTER — Observation Stay (HOSPITAL_COMMUNITY): Payer: Medicaid Other

## 2016-03-31 DIAGNOSIS — Z8249 Family history of ischemic heart disease and other diseases of the circulatory system: Secondary | ICD-10-CM

## 2016-03-31 DIAGNOSIS — Z91040 Latex allergy status: Secondary | ICD-10-CM

## 2016-03-31 DIAGNOSIS — E1010 Type 1 diabetes mellitus with ketoacidosis without coma: Secondary | ICD-10-CM | POA: Diagnosis not present

## 2016-03-31 DIAGNOSIS — Z79899 Other long term (current) drug therapy: Secondary | ICD-10-CM | POA: Diagnosis not present

## 2016-03-31 DIAGNOSIS — I309 Acute pericarditis, unspecified: Secondary | ICD-10-CM

## 2016-03-31 DIAGNOSIS — E875 Hyperkalemia: Secondary | ICD-10-CM | POA: Diagnosis present

## 2016-03-31 DIAGNOSIS — F411 Generalized anxiety disorder: Secondary | ICD-10-CM | POA: Diagnosis present

## 2016-03-31 DIAGNOSIS — Z794 Long term (current) use of insulin: Secondary | ICD-10-CM | POA: Diagnosis not present

## 2016-03-31 DIAGNOSIS — R17 Unspecified jaundice: Secondary | ICD-10-CM | POA: Diagnosis present

## 2016-03-31 DIAGNOSIS — K219 Gastro-esophageal reflux disease without esophagitis: Secondary | ICD-10-CM | POA: Diagnosis present

## 2016-03-31 DIAGNOSIS — Z880 Allergy status to penicillin: Secondary | ICD-10-CM | POA: Diagnosis not present

## 2016-03-31 DIAGNOSIS — E1029 Type 1 diabetes mellitus with other diabetic kidney complication: Secondary | ICD-10-CM

## 2016-03-31 DIAGNOSIS — F17210 Nicotine dependence, cigarettes, uncomplicated: Secondary | ICD-10-CM | POA: Diagnosis present

## 2016-03-31 DIAGNOSIS — Z833 Family history of diabetes mellitus: Secondary | ICD-10-CM | POA: Diagnosis not present

## 2016-03-31 DIAGNOSIS — E1065 Type 1 diabetes mellitus with hyperglycemia: Secondary | ICD-10-CM | POA: Diagnosis present

## 2016-03-31 DIAGNOSIS — E1110 Type 2 diabetes mellitus with ketoacidosis without coma: Secondary | ICD-10-CM | POA: Diagnosis present

## 2016-03-31 DIAGNOSIS — R7989 Other specified abnormal findings of blood chemistry: Secondary | ICD-10-CM | POA: Diagnosis not present

## 2016-03-31 DIAGNOSIS — R739 Hyperglycemia, unspecified: Secondary | ICD-10-CM

## 2016-03-31 DIAGNOSIS — IMO0002 Reserved for concepts with insufficient information to code with codable children: Secondary | ICD-10-CM | POA: Diagnosis present

## 2016-03-31 DIAGNOSIS — D72829 Elevated white blood cell count, unspecified: Secondary | ICD-10-CM | POA: Diagnosis present

## 2016-03-31 HISTORY — DX: Acute pericarditis, unspecified: I30.9

## 2016-03-31 LAB — CBC WITH DIFFERENTIAL/PLATELET
BASOS PCT: 0 %
Basophils Absolute: 0 10*3/uL (ref 0.0–0.1)
Eosinophils Absolute: 0 10*3/uL (ref 0.0–0.7)
Eosinophils Relative: 0 %
HEMATOCRIT: 48 % (ref 39.0–52.0)
HEMOGLOBIN: 16.4 g/dL (ref 13.0–17.0)
LYMPHS ABS: 1.2 10*3/uL (ref 0.7–4.0)
LYMPHS PCT: 7 %
MCH: 32.7 pg (ref 26.0–34.0)
MCHC: 34.2 g/dL (ref 30.0–36.0)
MCV: 95.6 fL (ref 78.0–100.0)
MONO ABS: 0.4 10*3/uL (ref 0.1–1.0)
MONOS PCT: 2 %
NEUTROS ABS: 15 10*3/uL — AB (ref 1.7–7.7)
NEUTROS PCT: 91 %
Platelets: 259 10*3/uL (ref 150–400)
RBC: 5.02 MIL/uL (ref 4.22–5.81)
RDW: 14.8 % (ref 11.5–15.5)
WBC: 16.6 10*3/uL — ABNORMAL HIGH (ref 4.0–10.5)

## 2016-03-31 LAB — COMPREHENSIVE METABOLIC PANEL
ALBUMIN: 4.6 g/dL (ref 3.5–5.0)
ALT: 19 U/L (ref 17–63)
AST: 31 U/L (ref 15–41)
Alkaline Phosphatase: 151 U/L — ABNORMAL HIGH (ref 38–126)
BUN: 21 mg/dL — AB (ref 6–20)
CHLORIDE: 97 mmol/L — AB (ref 101–111)
CREATININE: 1.88 mg/dL — AB (ref 0.61–1.24)
Calcium: 8.9 mg/dL (ref 8.9–10.3)
GFR calc Af Amer: 50 mL/min — ABNORMAL LOW (ref >60)
GFR, EST NON AFRICAN AMERICAN: 43 mL/min — AB (ref >60)
GLUCOSE: 573 mg/dL — AB (ref 65–99)
POTASSIUM: 5.9 mmol/L — AB (ref 3.5–5.1)
Sodium: 134 mmol/L — ABNORMAL LOW (ref 135–145)
Total Bilirubin: 3.5 mg/dL — ABNORMAL HIGH (ref 0.3–1.2)
Total Protein: 7.6 g/dL (ref 6.5–8.1)

## 2016-03-31 LAB — I-STAT CHEM 8, ED
BUN: 22 mg/dL — AB (ref 6–20)
CALCIUM ION: 0.94 mmol/L — AB (ref 1.13–1.30)
Chloride: 103 mmol/L (ref 101–111)
Creatinine, Ser: 0.8 mg/dL (ref 0.61–1.24)
Glucose, Bld: 590 mg/dL (ref 65–99)
HEMATOCRIT: 50 % (ref 39.0–52.0)
Hemoglobin: 17 g/dL (ref 13.0–17.0)
POTASSIUM: 5.7 mmol/L — AB (ref 3.5–5.1)
Sodium: 132 mmol/L — ABNORMAL LOW (ref 135–145)
TCO2: 9 mmol/L (ref 0–100)

## 2016-03-31 LAB — URINALYSIS, ROUTINE W REFLEX MICROSCOPIC
Bilirubin Urine: NEGATIVE
Hgb urine dipstick: NEGATIVE
LEUKOCYTES UA: NEGATIVE
Nitrite: NEGATIVE
PROTEIN: NEGATIVE mg/dL
Specific Gravity, Urine: 1.028 (ref 1.005–1.030)
pH: 5 (ref 5.0–8.0)

## 2016-03-31 LAB — CBG MONITORING, ED
GLUCOSE-CAPILLARY: 512 mg/dL — AB (ref 65–99)
GLUCOSE-CAPILLARY: 379 mg/dL — AB (ref 65–99)
Glucose-Capillary: >600 mg/dL (ref 65–99)
Glucose-Capillary: 420 mg/dL — ABNORMAL HIGH (ref 65–99)
Glucose-Capillary: 272 mg/dL — ABNORMAL HIGH (ref 65–99)

## 2016-03-31 LAB — I-STAT VENOUS BLOOD GAS, ED
Acid-base deficit: 23 mmol/L — ABNORMAL HIGH (ref 0.0–2.0)
BICARBONATE: 5.5 meq/L — AB (ref 20.0–24.0)
O2 Saturation: 98 %
PCO2 VEN: 18.8 mmHg — AB (ref 45.0–50.0)
PH VEN: 7.072 — AB (ref 7.250–7.300)
PO2 VEN: 153 mmHg — AB (ref 31.0–45.0)
TCO2: 6 mmol/L (ref 0–100)

## 2016-03-31 LAB — RAPID URINE DRUG SCREEN, HOSP PERFORMED
AMPHETAMINES: NOT DETECTED
BENZODIAZEPINES: NOT DETECTED
Barbiturates: NOT DETECTED
COCAINE: NOT DETECTED
OPIATES: NOT DETECTED
Tetrahydrocannabinol: NOT DETECTED

## 2016-03-31 LAB — BASIC METABOLIC PANEL
Anion gap: 23 — ABNORMAL HIGH (ref 5–15)
BUN: 19 mg/dL (ref 6–20)
CHLORIDE: 105 mmol/L (ref 101–111)
CO2: 8 mmol/L — AB (ref 22–32)
CREATININE: 1.66 mg/dL — AB (ref 0.61–1.24)
Calcium: 8.6 mg/dL — ABNORMAL LOW (ref 8.9–10.3)
GFR calc non Af Amer: 50 mL/min — ABNORMAL LOW (ref >60)
GFR, EST AFRICAN AMERICAN: 59 mL/min — AB (ref >60)
Glucose, Bld: 420 mg/dL — ABNORMAL HIGH (ref 65–99)
POTASSIUM: 4.9 mmol/L (ref 3.5–5.1)
Sodium: 136 mmol/L (ref 135–145)

## 2016-03-31 LAB — SEDIMENTATION RATE: SED RATE: 2 mm/h (ref 0–16)

## 2016-03-31 LAB — TROPONIN I: Troponin I: 0.06 ng/mL (ref <0.03)

## 2016-03-31 LAB — URINE MICROSCOPIC-ADD ON
BACTERIA UA: NONE SEEN
RBC / HPF: NONE SEEN RBC/hpf (ref 0–5)
WBC, UA: NONE SEEN WBC/hpf (ref 0–5)

## 2016-03-31 LAB — C-REACTIVE PROTEIN: CRP: 1.9 mg/dL — AB (ref <1.0)

## 2016-03-31 LAB — LACTIC ACID, PLASMA: Lactic Acid, Venous: 2.3 mmol/L (ref 0.5–1.9)

## 2016-03-31 LAB — BETA-HYDROXYBUTYRIC ACID

## 2016-03-31 LAB — LIPASE, BLOOD: LIPASE: 99 U/L — AB (ref 11–51)

## 2016-03-31 MED ORDER — ONDANSETRON HCL 4 MG/2ML IJ SOLN
4.0000 mg | Freq: Once | INTRAMUSCULAR | Status: AC
  Administered 2016-03-31: 4 mg via INTRAVENOUS
  Filled 2016-03-31: qty 2

## 2016-03-31 MED ORDER — SODIUM CHLORIDE 0.9 % IV BOLUS (SEPSIS)
1000.0000 mL | Freq: Once | INTRAVENOUS | Status: AC
  Administered 2016-03-31: 1000 mL via INTRAVENOUS

## 2016-03-31 MED ORDER — PANTOPRAZOLE SODIUM 40 MG PO TBEC
40.0000 mg | DELAYED_RELEASE_TABLET | Freq: Every day | ORAL | Status: DC
  Administered 2016-03-31 – 2016-04-02 (×3): 40 mg via ORAL
  Filled 2016-03-31 (×3): qty 1

## 2016-03-31 MED ORDER — IBUPROFEN 400 MG PO TABS
800.0000 mg | ORAL_TABLET | Freq: Three times a day (TID) | ORAL | Status: DC
  Administered 2016-03-31 – 2016-04-02 (×5): 800 mg via ORAL
  Filled 2016-03-31: qty 1
  Filled 2016-03-31: qty 2
  Filled 2016-03-31 (×2): qty 4
  Filled 2016-03-31: qty 2

## 2016-03-31 MED ORDER — ASPIRIN 81 MG PO CHEW
324.0000 mg | CHEWABLE_TABLET | Freq: Once | ORAL | Status: AC
Start: 2016-03-31 — End: 2016-03-31
  Administered 2016-03-31: 324 mg via ORAL
  Filled 2016-03-31: qty 4

## 2016-03-31 MED ORDER — HYDROCORTISONE 1 % EX CREA
1.0000 "application " | TOPICAL_CREAM | Freq: Two times a day (BID) | CUTANEOUS | Status: DC
  Administered 2016-04-01 – 2016-04-02 (×2): 1 via TOPICAL
  Filled 2016-03-31 (×2): qty 28

## 2016-03-31 MED ORDER — NICOTINE 14 MG/24HR TD PT24
14.0000 mg | MEDICATED_PATCH | Freq: Every day | TRANSDERMAL | Status: DC
  Filled 2016-03-31 (×2): qty 1

## 2016-03-31 MED ORDER — INSULIN REGULAR HUMAN 100 UNIT/ML IJ SOLN
INTRAMUSCULAR | Status: DC
  Administered 2016-03-31: 3.6 [IU]/h via INTRAVENOUS

## 2016-03-31 MED ORDER — SODIUM CHLORIDE 0.9 % IV SOLN
INTRAVENOUS | Status: AC
  Administered 2016-03-31: 1000 mL via INTRAVENOUS

## 2016-03-31 MED ORDER — CLOBETASOL PROPIONATE 0.05 % EX CREA
1.0000 "application " | TOPICAL_CREAM | Freq: Two times a day (BID) | CUTANEOUS | Status: DC | PRN
  Filled 2016-03-31: qty 15

## 2016-03-31 MED ORDER — SODIUM CHLORIDE 0.9 % IV SOLN
INTRAVENOUS | Status: DC
  Administered 2016-03-31: 21:00:00 via INTRAVENOUS

## 2016-03-31 MED ORDER — SODIUM CHLORIDE 0.9 % IV SOLN
INTRAVENOUS | Status: DC
  Administered 2016-03-31: 1000 mL via INTRAVENOUS

## 2016-03-31 MED ORDER — BUSPIRONE HCL 5 MG PO TABS
10.0000 mg | ORAL_TABLET | Freq: Three times a day (TID) | ORAL | Status: DC
  Administered 2016-04-01 – 2016-04-02 (×2): 10 mg via ORAL
  Filled 2016-03-31 (×2): qty 2
  Filled 2016-03-31: qty 1

## 2016-03-31 MED ORDER — FAMOTIDINE IN NACL 20-0.9 MG/50ML-% IV SOLN
20.0000 mg | Freq: Two times a day (BID) | INTRAVENOUS | Status: DC
  Administered 2016-03-31 – 2016-04-02 (×4): 20 mg via INTRAVENOUS
  Filled 2016-03-31 (×5): qty 50

## 2016-03-31 MED ORDER — ENOXAPARIN SODIUM 40 MG/0.4ML ~~LOC~~ SOLN
40.0000 mg | SUBCUTANEOUS | Status: DC
  Filled 2016-03-31: qty 0.4

## 2016-03-31 MED ORDER — DEXTROSE-NACL 5-0.45 % IV SOLN
INTRAVENOUS | Status: DC
  Administered 2016-04-01: 1000 mL via INTRAVENOUS
  Administered 2016-04-01: 07:00:00 via INTRAVENOUS

## 2016-03-31 MED ORDER — SODIUM CHLORIDE 0.9 % IV SOLN
INTRAVENOUS | Status: DC
  Administered 2016-03-31: 4.5 [IU]/h via INTRAVENOUS
  Filled 2016-03-31: qty 2.5

## 2016-03-31 NOTE — ED Notes (Signed)
Patient transported to Ultrasound 

## 2016-03-31 NOTE — ED Provider Notes (Signed)
[facility] DEPT Provider Note   CSN: [id number] Arrival date & time: 03/31/16  1715     History   Chief Complaint Chief Complaint  Patient presents with  . Hyperglycemia    HPI Jared Murray is a 40 y.o. male.  HPI  This patient with a history of diabetes, and DKA, presents for feelings of fatigue. This started yesterday. He's had some nausea, without emesis. Denies any fevers, urinary symptoms, shortness of breath, chest pain. Last took his insulin this morning. Has had increased thirst and increased urination.  Past Medical History:  Diagnosis Date  . DKA (diabetic ketoacidoses) ([place])   . Eczema   . GERD (gastroesophageal reflux disease)   . Psoriasis   . Type 1 diabetes mellitus [facility])     Patient Active Problem List   Diagnosis Date Noted  . Non compliance w medication regimen 01/07/2016  . Generalized anxiety disorder 11/21/2015  . Non compliance with medical treatment 11/21/2015  . DKA (diabetic ketoacidoses) ([place]) 11/02/2015  . Leukocytosis 11/02/2015  . Tobacco use disorder 09/13/2015  . Nausea & vomiting 08/23/2015  . Type 1 diabetes mellitus with hyperglycemia ([place])   . Diabetic ketoacidosis without coma associated with type 1 diabetes mellitus ([place])   . SIRS (systemic inflammatory response syndrome) (HCC)   . Avascular necrosis of bone of hip ([place])   . DKA (diabetic ketoacidosis) ([place]) 05/14/2015  . Diabetic ketoacidosis associated with other specified diabetes mellitus ([place])   . Lactic acidosis   . Cutaneous abscess of chest wall   . Diabetes type 1, uncontrolled ([place])   . DKA, type 1, not at goal [facility]) 04/03/2015  . Chest wall abscess 04/03/2015  . Avascular necrosis ([place]) 02/12/2015  . Type 1 diabetes mellitus ([place]) 01/29/2015  . Sepsis ([place]) 01/22/2015  . GERD (gastroesophageal reflux disease) 01/22/2015  . AKI (acute kidney injury) ([place]) 01/22/2015  . Hyperkalemia   . Viral gastroenteritis 07/01/2014  . Left hip pain 04/18/2014  .  Dehydration 01/23/2014  . Psoriasis 07/01/2013  . Loss of weight 03/17/2013  . Hypokalemia 03/17/2013  . Eczema 03/17/2013  . Protein-calorie malnutrition, severe ([place]) 03/17/2013    Past Surgical History:  Procedure Laterality Date  . FINGER SURGERY         Home Medications    Prior to Admission medications   Medication Sig Start Date End Date Taking? Authorizing Provider  ACCU-CHEK AVIVA PLUS test strip USE TO CHECK BLOOD SUGAR FOUR TIMES DAILY BEFORE MEALS AND AT BEDTIME 03/17/16   [name], MD  acetaminophen-codeine (TYLENOL #3) 300-30 MG tablet TAKE 1 TABLET BY MOUTH EVERY 8 HOURS AS NEEDED FOR MODERATE PAIN 03/28/16   [name], MD  Blood Glucose Monitoring Suppl (ACCU-CHEK AVIVA) device Use as instructed 06/27/15 06/26/16  Leone Brand, MD  busPIRone (BUSPAR) 10 MG tablet Take 1 tablet (10 mg total) by mouth 3 (three) times daily. Patient taking differently: Take 10 mg by mouth daily as needed (anxiety).  12/07/15   [name], MD  clobetasol cream (TEMOVATE) [id number] % Apply 1 application topically 2 (two) times daily. 04/13/15   [name], MD  hydrocortisone 2.5 % cream Apply topically 2 (two) times daily.    Historical Provider, MD  insulin glargine (LANTUS) 100 UNIT/ML injection Inject 0.2 mLs (20 Units total) into the skin at bedtime. 01/21/16   [name], MD  Insulin Syringe-Needle U-100 (B-D INS SYRINGE 2CC/29GX1/2") 29G X 1/2" 2 ML MISC Check Blood sugar TID and QHS 03/22/14   Olugbemiga E  Doreene Burke, MD  Lancet Devices [facility]) lancets Use as instructed three times daily. 11/21/15   [name], MD  nicotine (NICODERM CQ - DOSED IN MG/24 HOURS) 14 mg/24hr patch Place 1 patch (14 mg total) onto the skin daily. Patient not taking: Reported on 01/21/2016 09/13/15   [name], MD  NOVOLOG 100 UNIT/ML injection Inject 0-12 Units into the skin 3 (three) times daily with meals. As per sliding scale 01/07/16   [name], MD  Olopatadine HCl 0.2 % SOLN Place 1 drop  in each eye twice daily Patient not taking: Reported on 01/21/2016 12/10/15   [name], MD  pantoprazole (PROTONIX) 40 MG tablet TAKE 1 TABLET BY MOUTH DAILY 02/08/16   [name], MD    Family History Family History  Problem Relation Age of Onset  . Hypertension Father   . Diabetes      multiple  . Lupus Cousin   . Stroke Maternal Grandmother   . Stroke Paternal Grandmother     Social History Social History  Substance Use Topics  . Smoking status: Current Some Day Smoker    Packs/day: 0.25    Years: 10.00    Types: Cigarettes  . Smokeless tobacco: Never Used     Comment: Smoking 2-3 cigs/week  . Alcohol use 0.0 oz/week     Comment: ocassional     Allergies   Latex and Penicillins   Review of Systems Review of Systems  Constitutional: Negative for chills and fever.  HENT: Negative for ear pain and sore throat.   Eyes: Negative for pain and visual disturbance.  Respiratory: Negative for cough and shortness of breath.   Cardiovascular: Negative for chest pain and palpitations.  Gastrointestinal: Positive for nausea and vomiting. Negative for abdominal pain.  Endocrine: Positive for polydipsia and polyuria.  Genitourinary: Negative for dysuria and hematuria.  Musculoskeletal: Negative for arthralgias and back pain.  Skin: Negative for color change and rash.  Neurological: Negative for seizures and syncope.  All other systems reviewed and are negative.    Physical Exam Updated Vital Signs BP 132/81 (BP Location: Right Arm)   Pulse 109   Temp 97.7 F (36.5 C) (Oral)   Ht 5\' 10"  (1.778 m)   Wt 60.3 kg   SpO2 98%   BMI 19.08 kg/m   Physical Exam  Constitutional: He appears well-developed and well-nourished.  Non-toxic appearance. He has a sickly appearance. He appears ill.  HENT:  Head: Normocephalic and atraumatic.  Dry MM  Eyes: Conjunctivae are normal.  Neck: Neck supple.  Cardiovascular: Normal rate and regular rhythm.   No murmur  heard. Pulmonary/Chest: Effort normal and breath sounds normal. No respiratory distress.  Abdominal: Soft. There is no tenderness.  Musculoskeletal: He exhibits no edema.  Neurological: He is alert.  Skin: Skin is warm and dry.  Psychiatric: He has a normal mood and affect.  Nursing note and vitals reviewed.    ED Treatments / Results  Labs (all labs ordered are listed, but only abnormal results are displayed) Labs Reviewed  CBG MONITORING, ED - Abnormal; Notable for the following:       Result Value   Glucose-Capillary >600 (*)    All other components within normal limits    EKG  EKG Interpretation None       Radiology No results found.  Procedures Procedures (including critical care time)  Medications Ordered in ED Medications - No data to display   Initial Impression / Assessment and Plan / ED Course  I have reviewed the triage vital signs and the nursing notes.  Pertinent labs & imaging results that were available during my care of the patient were reviewed by me and considered in my medical decision making (see chart for details).  Clinical Course    Patient presents for n/v with high CBG.  Labs show AG metabolic acidosis.  Concerned for DKA.  Fluids + insulin started.  Abdominal exam benign.  He did have mildly elevated lipase.  Glucosuria.  No infectious signs to suggest etiology.  Patient endorsed compliance with insulin.  Hospitalist consulted and patient admitted.  HDS during his stay here.   Final Clinical Impressions(s) / ED Diagnoses   Final diagnoses:  None    New Prescriptions New Prescriptions   No medications on file     Levada Schilling, MD 03/31/16 [place], MD 04/01/16 [phone]

## 2016-03-31 NOTE — ED Notes (Signed)
Admitting at bedside 

## 2016-03-31 NOTE — ED Notes (Signed)
Patient transported to X-ray 

## 2016-03-31 NOTE — H&P (Signed)
History and Physical    OUSMANE Murray PTW:[id number] DOB: [birth year] DOA: 03/31/2016  PCP: [name], MD   Patient coming from: Home  Chief Complaint: Nausea, vomiting, polyuria   HPI: Jared Murray is a 40 y.o. male with medical history significant for type 1 diabetes mellitus, GERD, psoriasis, and eczema who presents the emergency department with nausea, vomiting, and polyuria which began early in the morning of his presentation. Patient was in his usual state of health when going to bed last night and reports strict adherence to his insulin regimen. He woke approximately 3 AM this morning with nausea, vomiting, and polyuria. He denies fevers or chills, cough, or dyspnea. He also denies abdominal pain, diarrhea, melena, or hematochezia. Patient reports some epigastric pain which she attributes to vomiting, describing it as localized to the lower central chest, waxing and waning, worse with laying on his side, and with no alleviating factors identified. Patient denies any recent alcohol or illicit drug use. He denies any new rash or wound. He denies recent rhinorrhea or sore throat. There is no headache, neck stiffness, phono- or photophobia, or focal numbness or weakness.   ED Course: Upon arrival to the ED, patient is found to be afebrile, saturating well on room air, tachycardic low 100s, and with vitals otherwise stable. 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 Chest x-ray is negative for acute cardiopulmonary disease. CMP features a hyperkalemia to 5.9, bicarbonate of less than 7, and serum glucose of 573. CBC features a leukocytosis to 70,017 with neutrophilic predominance. Troponin is elevated to a value of 0.06. Patient was given 2 L of normal saline and started on insulin infusion in the emergency department. He was treated symptomatically with Zofran. He remained  hemodynamically stable and will be observed in the stepdown unit for ongoing evaluation and management of DKA in a type I diabetic, possibly precipitated by acute pericarditis.  Review of Systems:  All other systems reviewed and apart from HPI, are negative.  Past Medical History:  Diagnosis Date  . DKA (diabetic ketoacidoses) ([place])   . Eczema   . GERD (gastroesophageal reflux disease)   . Psoriasis   . Type 1 diabetes mellitus ([place])     Past Surgical History:  Procedure Laterality Date  . FINGER SURGERY       reports that he has been smoking Cigarettes.  He has a 2.50 pack-year smoking history. He has never used smokeless tobacco. He reports that he drinks alcohol. He reports that he does not use drugs.  Allergies  Allergen Reactions  . Latex Itching and Rash  . Penicillins Other (See Comments)    Childhood allergy Has patient had a PCN reaction causing immediate rash, facial/tongue/throat swelling, SOB or lightheadedness with hypotension: NO Has patient had a PCN reaction causing severe rash involving mucus membranes or skin necrosis:NO Has patient had a PCN reaction that required hospitalization NO Has patient had a PCN reaction occurring within the last 10 years: NO If all of the above answers are "NO", then may proceed with Cephalosporin use.    Family History  Problem Relation Age of Onset  . Hypertension Father   . Diabetes      multiple  . Lupus Cousin   . Stroke Maternal Grandmother   . Stroke Paternal Grandmother      Prior to Admission medications   Medication Sig Start Date End Date Taking? Authorizing Provider  acetaminophen-codeine (TYLENOL #3) 300-30 MG tablet TAKE 1 TABLET BY MOUTH EVERY 8 HOURS AS NEEDED FOR MODERATE PAIN 03/28/16  Yes [name], MD  busPIRone (BUSPAR) 10 MG tablet Take 1 tablet (10 mg total) by mouth 3 (three) times daily. Patient taking differently: Take 10 mg by mouth daily as needed (anxiety).  12/07/15  Yes [name], MD    insulin glargine (LANTUS) 100 UNIT/ML injection Inject 0.2 mLs (20 Units total) into the skin at bedtime. 01/21/16  Yes [name], MD  NOVOLOG 100 UNIT/ML injection Inject 0-12 Units into the skin 3 (three) times daily with meals. As per sliding scale 01/07/16  Yes [name], MD  pantoprazole (PROTONIX) 40 MG tablet TAKE 1 TABLET BY MOUTH DAILY Patient taking differently: TAKE 1 TABLET BY MOUTH DAILY as needed for acid reflux 02/08/16  Yes [name], MD  ACCU-CHEK AVIVA PLUS test strip USE TO CHECK BLOOD SUGAR FOUR TIMES DAILY BEFORE MEALS AND AT BEDTIME 03/17/16   [name], MD  Blood Glucose Monitoring Suppl (ACCU-CHEK AVIVA) device Use as instructed 06/27/15 06/26/16  Leone Brand, MD  clobetasol cream (TEMOVATE) 5.40 % Apply 1 application topically 2 (two) times daily. Patient taking differently: Apply 1 application topically 2 (two) times daily as needed.  04/13/15   [name], MD  hydrocortisone 2.5 % cream Apply 1 application topically 2 (two) times daily as needed.     Historical Provider, MD  Insulin Syringe-Needle U-100 (B-D INS SYRINGE 2CC/29GX1/2") 29G X 1/2" 2 ML MISC Check Blood sugar TID and QHS 03/22/14   Tresa Garter, MD  Lancet Devices [facility]) lancets Use as instructed three times daily. 11/21/15   [name], MD  nicotine (NICODERM CQ - DOSED IN MG/24 HOURS) 14 mg/24hr patch Place 1 patch (14 mg total) onto the skin daily. Patient not taking: Reported on 01/21/2016 09/13/15   [name], MD  Olopatadine HCl 0.2 % SOLN Place 1 drop in each eye twice daily Patient not taking: Reported on 01/21/2016 12/10/15   [name], MD    Physical Exam: Vitals:   03/31/16 1900 03/31/16 1915 03/31/16 1950 03/31/16 2000  BP: 117/72 121/67 141/88 136/85  Pulse: 106 106 102 102  Resp: 24 24 (!) 29 23  Temp:      TempSrc:      SpO2: 100% 100% 100% 100%  Weight:      Height:          Constitutional: NAD, calm, in apparent discomfort Eyes: PERTLA, lids and  conjunctivae normal ENMT: Mucous membranes are dry. Posterior pharynx clear of any exudate or lesions.   Neck: normal, supple, no masses, no thyromegaly Respiratory: clear to auscultation bilaterally, no wheezing, no crackles. Normal respiratory effort.   Cardiovascular: Rate ~100 and regular. No extremity edema. No significant JVD. Abdomen: No distension, no tenderness, no masses palpated. Bowel sounds normal.  Musculoskeletal: no clubbing / cyanosis. No joint deformity upper and lower extremities. Normal muscle tone.  Skin: Post-inflammatory hypopigmentation and lichenification at extensor surfaces. Warm, dry, well-perfused. Neurologic: CN 2-12 grossly intact. Sensation intact, DTR normal. Strength 5/5 in all 4 limbs.  Psychiatric: Normal judgment and insight. Alert and oriented x 3. Normal mood and affect.     Labs on Admission: I have personally reviewed following labs and imaging studies  CBC:  Recent Labs Lab 03/31/16 1833 03/31/16 1839  WBC 16.6*  --   NEUTROABS 15.0*  --   HGB 16.4 17.0  HCT 48.0 50.0  MCV 95.6  --  PLT 259  --    Basic Metabolic Panel:  Recent Labs Lab 03/31/16 1834 03/31/16 1839  NA 134* 132*  K 5.9* 5.7*  CL 97* 103  CO2 <7*  --   GLUCOSE 573* 590*  BUN 21* 22*  CREATININE 1.88* 0.80  CALCIUM 8.9  --    GFR: Estimated Creatinine Clearance: 105.7 mL/min (by C-G formula based on SCr of 0.8 mg/dL). Liver Function Tests:  Recent Labs Lab 03/31/16 1834  AST 31  ALT 19  ALKPHOS 151*  BILITOT 3.5*  PROT 7.6  ALBUMIN 4.6    Recent Labs Lab 03/31/16 1834  LIPASE 99*   No results for input(s): AMMONIA in the last 168 hours. Coagulation Profile: No results for input(s): INR, PROTIME in the last 168 hours. Cardiac Enzymes:  Recent Labs Lab 03/31/16 1828  TROPONINI 0.06*   BNP (last 3 results) No results for input(s): PROBNP in the last 8760 hours. HbA1C: No results for input(s): HGBA1C in the last 72 hours. CBG:  Recent  Labs Lab 03/31/16 1715 03/31/16 1959  GLUCAP >600* 512*   Lipid Profile: No results for input(s): CHOL, HDL, LDLCALC, TRIG, CHOLHDL, LDLDIRECT in the last 72 hours. Thyroid Function Tests: No results for input(s): TSH, T4TOTAL, FREET4, T3FREE, THYROIDAB in the last 72 hours. Anemia Panel: No results for input(s): VITAMINB12, FOLATE, FERRITIN, TIBC, IRON, RETICCTPCT in the last 72 hours. Urine analysis:    Component Value Date/Time   COLORURINE YELLOW 03/31/2016 1850   APPEARANCEUR CLEAR 03/31/2016 1850   LABSPEC 1.028 03/31/2016 1850   PHURINE 5.0 03/31/2016 1850   GLUCOSEU >1000 (A) 03/31/2016 1850   HGBUR NEGATIVE 03/31/2016 1850   BILIRUBINUR NEGATIVE 03/31/2016 1850   BILIRUBINUR neg 03/16/2015 1230   KETONESUR >80 (A) 03/31/2016 1850   PROTEINUR NEGATIVE 03/31/2016 1850   UROBILINOGEN 0.2 06/25/2015 0913   NITRITE NEGATIVE 03/31/2016 1850   LEUKOCYTESUR NEGATIVE 03/31/2016 1850   Sepsis Labs: '@LABRCNTIP' (procalcitonin:4,lacticidven:4) )No results found for this or any previous visit (from the past 240 hour(s)).   Radiological Exams on Admission: Dg Chest 1 View  Result Date: 03/31/2016 CLINICAL DATA:  Shortness of breath and nausea for 1 day. Diabetic ketoacidosis EXAM: CHEST 1 VIEW COMPARISON:  August 23, 2015 FINDINGS: Lungs are clear. Heart size and pulmonary vascularity are normal. No adenopathy. No bone lesions. IMPRESSION: No edema or consolidation. Electronically Signed   By: Lowella Grip III M.D.   On: 03/31/2016 19:53    EKG: Independently reviewed. Sinus tachycardia (rate 107), RBBB & LpFB (chronic), ST-elevations diffusely c/w acute pericarditis  Assessment/Plan  1. Type 1 DM with DKA  - Presents with CBG >600, bicarb <7, ketones in urine - Uncertain precipitant; pt reports strict adherence to insulin regimen; possibly secondary to acute pericarditis as below  - Given 2 L NS as bolus in ED and started on insulin infusion  - Continue aggressive  fluid-resuscitation and insulin infusion until parameters met for transition to sq insulin  - Serial chem panel q4h until DKA and associated electrolyte derangements has resolved    2. Elevated troponin, EKG changes  - Suspect acute pericarditis based on EKG findings  - Trend troponin; monitor on telemetry  - Check inflammatory markers  - TTE ordered  - Doubt a bacterial etiology, but blood cxs incubating  - ASA 324 given - Advil 800 TID   3. GERD  - Continue Protonix  - Add Pepcid d/t N/V, NPO, and high-dose NSAID's   4. Anxiety  - Stable, continue buspar at  current-dosing    5. Hyperbilirubinemia  - T bili 3.5, higher than priors  - No RUQ pain, but given uncertain precipitant for DKA, will check RUQ [place]    DVT prophylaxis: sq Lovenox  Code Status: Full  Family Communication: Discussed with patient  Disposition Plan: Observe on stepdown  Consults called: None Admission status: Observation    Vianne Bulls, MD [facility] Pager [phone]  If 7PM-7AM, please contact night-coverage [email or web address] Password [facility]  03/31/2016, 8:14 PM

## 2016-03-31 NOTE — ED Provider Notes (Addendum)
Patient with multiple episodes of vomiting onset approximately 24 hours ago with general malaise and feels dehydrated. He denies noncompliance with his Lantus insulin he did not take any NovoLog today as he he's been unable to eat other associated symptoms include subjective fever denies cough denies abdominal pain denies chest pain denies headache on exam alert chronically ill-appearing HEENT exam he does members dry lungs clear auscultation heart regular rate and rhythm abdomen nondistended nontender extremities without edema skin warm dry ED ECG REPORT   Date: 03/31/2016  Rate: 105  Rhythm: sinus tachycardia  QRS Axis: normal  Intervals: normal  ST/T Wave abnormalities: nonspecific T wave changes  Conduction Disutrbances:right bundle branch block  Narrative Interpretation:   Old EKG Reviewed: unchanged from 11/02/2015 No evidence of pericarditis I have personally reviewed the EKG tracing and disagree with the computerized printout as noted.  8 PM feels much improved after treatment with intravenous fluids, intravenous insulin. Hospitalist service consulted. He will be admitted to stepdown unit Results for orders placed or performed during the hospital encounter of 03/31/16  CBC with Differential (PNL)  Result Value Ref Range   WBC 16.6 (H) 4.0 - 10.5 K/uL   RBC 5.02 4.22 - 5.81 MIL/uL   Hemoglobin 16.4 13.0 - 17.0 g/dL   HCT 48.0 39.0 - 52.0 %   MCV 95.6 78.0 - 100.0 fL   MCH 32.7 26.0 - 34.0 pg   MCHC 34.2 30.0 - 36.0 g/dL   RDW 14.8 11.5 - 15.5 %   Platelets 259 150 - 400 K/uL   Neutrophils Relative % 91 %   Neutro Abs 15.0 (H) 1.7 - 7.7 K/uL   Lymphocytes Relative 7 %   Lymphs Abs 1.2 0.7 - 4.0 K/uL   Monocytes Relative 2 %   Monocytes Absolute 0.4 0.1 - 1.0 K/uL   Eosinophils Relative 0 %   Eosinophils Absolute 0.0 0.0 - 0.7 K/uL   Basophils Relative 0 %   Basophils Absolute 0.0 0.0 - 0.1 K/uL  Beta-hydroxybutyric acid  Result Value Ref Range   Beta-Hydroxybutyric Acid  >8.00 (H) 0.05 - 0.27 mmol/L  Urinalysis, Routine w reflex microscopic (not at [facility])  Result Value Ref Range   Color, Urine YELLOW YELLOW   APPearance CLEAR CLEAR   Specific Gravity, Urine 1.028 1.005 - 1.030   pH 5.0 5.0 - 8.0   Glucose, UA >1000 (A) NEGATIVE mg/dL   Hgb urine dipstick NEGATIVE NEGATIVE   Bilirubin Urine NEGATIVE NEGATIVE   Ketones, ur >80 (A) NEGATIVE mg/dL   Protein, ur NEGATIVE NEGATIVE mg/dL   Nitrite NEGATIVE NEGATIVE   Leukocytes, UA NEGATIVE NEGATIVE  Comprehensive metabolic panel  Result Value Ref Range   Sodium 134 (L) 135 - 145 mmol/L   Potassium 5.9 (H) 3.5 - 5.1 mmol/L   Chloride 97 (L) 101 - 111 mmol/L   CO2 <7 (L) 22 - 32 mmol/L   Glucose, Bld 573 (HH) 65 - 99 mg/dL   BUN 21 (H) 6 - 20 mg/dL   Creatinine, Ser 1.88 (H) 0.61 - 1.24 mg/dL   Calcium 8.9 8.9 - 10.3 mg/dL   Total Protein 7.6 6.5 - 8.1 g/dL   Albumin 4.6 3.5 - 5.0 g/dL   AST 31 15 - 41 U/L   ALT 19 17 - 63 U/L   Alkaline Phosphatase 151 (H) 38 - 126 U/L   Total Bilirubin 3.5 (H) 0.3 - 1.2 mg/dL   GFR calc non Af Amer 43 (L) >60 mL/min   GFR calc Af  Amer 50 (L) >60 mL/min   Anion gap NOT CALCULATED 5 - 15  Lipase, blood  Result Value Ref Range   Lipase 99 (H) 11 - 51 U/L  Troponin I  Result Value Ref Range   Troponin I 0.06 (HH) <0.03 ng/mL  Urine microscopic-add on  Result Value Ref Range   Squamous Epithelial / LPF 0-5 (A) NONE SEEN   WBC, UA NONE SEEN 0 - 5 WBC/hpf   RBC / HPF NONE SEEN 0 - 5 RBC/hpf   Bacteria, UA NONE SEEN NONE SEEN  CBG monitoring, ED  Result Value Ref Range   Glucose-Capillary >600 (HH) 65 - 99 mg/dL  I-Stat Chem 8, ED  (not at [facility])  Result Value Ref Range   Sodium 132 (L) 135 - 145 mmol/L   Potassium 5.7 (H) 3.5 - 5.1 mmol/L   Chloride 103 101 - 111 mmol/L   BUN 22 (H) 6 - 20 mg/dL   Creatinine, Ser 0.80 0.61 - 1.24 mg/dL   Glucose, Bld 590 (HH) 65 - 99 mg/dL   Calcium, Ion 0.94 (L) 1.13 - 1.30 mmol/L   TCO2 9 0 - 100 mmol/L   Hemoglobin  17.0 13.0 - 17.0 g/dL   HCT 50.0 39.0 - 52.0 %   Comment NOTIFIED PHYSICIAN   I-Stat Venous Blood Gas, ED  ([facility], [facility])  Result Value Ref Range   pH, Ven 7.072 (LL) 7.250 - 7.300   pCO2, Ven 18.8 (L) 45.0 - 50.0 mmHg   pO2, Ven 153.0 (H) 31.0 - 45.0 mmHg   Bicarbonate 5.5 (L) 20.0 - 24.0 mEq/L   TCO2 6 0 - 100 mmol/L   O2 Saturation 98.0 %   Acid-base deficit 23.0 (H) 0.0 - 2.0 mmol/L   Patient temperature HIDE    Sample type VENOUS    Comment NOTIFIED PHYSICIAN   CBG monitoring, ED  Result Value Ref Range   Glucose-Capillary 512 (HH) 65 - 99 mg/dL   Dg Chest 1 View  Result Date: 03/31/2016 CLINICAL DATA:  Shortness of breath and nausea for 1 day. Diabetic ketoacidosis EXAM: CHEST 1 VIEW COMPARISON:  August 23, 2015 FINDINGS: Lungs are clear. Heart size and pulmonary vascularity are normal. No adenopathy. No bone lesions. IMPRESSION: No edema or consolidation. Electronically Signed   By: Lowella Grip III M.D.   On: 03/31/2016 19:53  Diagnosis diabetic ketoacidosis   CRITICAL CARE Performed by: Orlie Dakin Total critical care time: 30 minutes Critical care time was exclusive of separately billable procedures and treating other patients. Critical care was necessary to treat or prevent imminent or life-threatening deterioration. Critical care was time spent personally by me on the following activities: development of treatment plan with patient and/or surrogate as well as nursing, discussions with consultants, evaluation of patient's response to treatment, examination of patient, obtaining history from patient or surrogate, ordering and performing treatments and interventions, ordering and review of laboratory studies, ordering and review of radiographic studies, pulse oximetry and re-evaluation of patient's condition. Orlie Dakin, MD 03/31/16 2008    Orlie Dakin, MD 03/31/16 2012

## 2016-03-31 NOTE — ED Triage Notes (Signed)
Patient came in by [facility] with hyperglycemia, weakness, and nausea and vomiting; FSBS was 590 upon arrival to ED. Patient alert. Patient has hx of type 1 DM. Patient states he has been taking his insulin as prescribed. Last dose of insulin was around 11am this AM. Wife noticed patient was weak and called EMS. V/S stable. Patient denies pain. Nausea and vomiting started this AM. EMS gave zofran and 500 ml bolus. 20 G IV in left forearm. Abnormal EKG; patient denies any cardiac hx.

## 2016-03-31 NOTE — ED Notes (Signed)
Pt able to eat via M.Supplas verbal order to RN; Public affairs consultant ordered

## 2016-03-31 NOTE — ED Notes (Signed)
Dinner tray ordered, heart healthy diet 

## 2016-04-01 ENCOUNTER — Encounter (HOSPITAL_COMMUNITY): Payer: Self-pay

## 2016-04-01 ENCOUNTER — Inpatient Hospital Stay (HOSPITAL_COMMUNITY): Payer: Medicaid Other

## 2016-04-01 LAB — BASIC METABOLIC PANEL
ANION GAP: 9 (ref 5–15)
ANION GAP: 5 (ref 5–15)
ANION GAP: 7 (ref 5–15)
Anion gap: 9 (ref 5–15)
BUN: 14 mg/dL (ref 6–20)
BUN: 12 mg/dL (ref 6–20)
BUN: 11 mg/dL (ref 6–20)
BUN: 8 mg/dL (ref 6–20)
CALCIUM: 7.9 mg/dL — AB (ref 8.9–10.3)
CALCIUM: 8.3 mg/dL — AB (ref 8.9–10.3)
CALCIUM: 8.3 mg/dL — AB (ref 8.9–10.3)
CO2: 16 mmol/L — AB (ref 22–32)
CO2: 19 mmol/L — ABNORMAL LOW (ref 22–32)
CO2: 20 mmol/L — AB (ref 22–32)
CO2: 19 mmol/L — ABNORMAL LOW (ref 22–32)
CREATININE: 0.71 mg/dL (ref 0.61–1.24)
Calcium: 8.1 mg/dL — ABNORMAL LOW (ref 8.9–10.3)
Chloride: 112 mmol/L — ABNORMAL HIGH (ref 101–111)
Chloride: 113 mmol/L — ABNORMAL HIGH (ref 101–111)
Chloride: 111 mmol/L (ref 101–111)
Chloride: 105 mmol/L (ref 101–111)
Creatinine, Ser: 1.23 mg/dL (ref 0.61–1.24)
Creatinine, Ser: 0.96 mg/dL (ref 0.61–1.24)
Creatinine, Ser: 0.8 mg/dL (ref 0.61–1.24)
GFR calc Af Amer: >60 mL/min (ref >60)
GFR calc Af Amer: >60 mL/min (ref >60)
GFR calc Af Amer: >60 mL/min (ref >60)
GFR calc Af Amer: >60 mL/min (ref >60)
GLUCOSE: 210 mg/dL — AB (ref 65–99)
GLUCOSE: 114 mg/dL — AB (ref 65–99)
Glucose, Bld: 106 mg/dL — ABNORMAL HIGH (ref 65–99)
Glucose, Bld: 222 mg/dL — ABNORMAL HIGH (ref 65–99)
POTASSIUM: 3.9 mmol/L (ref 3.5–5.1)
POTASSIUM: 3.8 mmol/L (ref 3.5–5.1)
Potassium: 4.3 mmol/L (ref 3.5–5.1)
Potassium: 4.4 mmol/L (ref 3.5–5.1)
SODIUM: 133 mmol/L — AB (ref 135–145)
Sodium: 137 mmol/L (ref 135–145)
Sodium: 137 mmol/L (ref 135–145)
Sodium: 138 mmol/L (ref 135–145)

## 2016-04-01 LAB — CBG MONITORING, ED
GLUCOSE-CAPILLARY: 102 mg/dL — AB (ref 65–99)
GLUCOSE-CAPILLARY: 109 mg/dL — AB (ref 65–99)
GLUCOSE-CAPILLARY: 197 mg/dL — AB (ref 65–99)
GLUCOSE-CAPILLARY: 218 mg/dL — AB (ref 65–99)
GLUCOSE-CAPILLARY: 66 mg/dL (ref 65–99)
Glucose-Capillary: 183 mg/dL — ABNORMAL HIGH (ref 65–99)
Glucose-Capillary: 146 mg/dL — ABNORMAL HIGH (ref 65–99)

## 2016-04-01 LAB — GLUCOSE, CAPILLARY
GLUCOSE-CAPILLARY: 91 mg/dL (ref 65–99)
GLUCOSE-CAPILLARY: 94 mg/dL (ref 65–99)
GLUCOSE-CAPILLARY: 97 mg/dL (ref 65–99)
GLUCOSE-CAPILLARY: 200 mg/dL — AB (ref 65–99)
GLUCOSE-CAPILLARY: 131 mg/dL — AB (ref 65–99)
Glucose-Capillary: 97 mg/dL (ref 65–99)
Glucose-Capillary: 111 mg/dL — ABNORMAL HIGH (ref 65–99)
Glucose-Capillary: 129 mg/dL — ABNORMAL HIGH (ref 65–99)
Glucose-Capillary: 146 mg/dL — ABNORMAL HIGH (ref 65–99)

## 2016-04-01 LAB — TROPONIN I
Troponin I: <0.03 ng/mL (ref <0.03)
Troponin I: <0.03 ng/mL (ref <0.03)

## 2016-04-01 LAB — LACTIC ACID, PLASMA: LACTIC ACID, VENOUS: 1.4 mmol/L (ref 0.5–1.9)

## 2016-04-01 LAB — HIV ANTIBODY (ROUTINE TESTING W REFLEX): HIV SCREEN 4TH GENERATION: NONREACTIVE

## 2016-04-01 LAB — MRSA PCR SCREENING: MRSA by PCR: NEGATIVE

## 2016-04-01 MED ORDER — INSULIN GLARGINE 100 UNIT/ML ~~LOC~~ SOLN
20.0000 [IU] | Freq: Every day | SUBCUTANEOUS | Status: DC
  Administered 2016-04-01: 20 [IU] via SUBCUTANEOUS
  Filled 2016-04-01 (×2): qty 0.2

## 2016-04-01 MED ORDER — DEXTROSE 50 % IV SOLN
14.0000 mL | Freq: Once | INTRAVENOUS | Status: AC
  Administered 2016-04-01: 14 mL via INTRAVENOUS
  Filled 2016-04-01: qty 50

## 2016-04-01 MED ORDER — INSULIN ASPART 100 UNIT/ML ~~LOC~~ SOLN
0.0000 [IU] | Freq: Three times a day (TID) | SUBCUTANEOUS | Status: DC
  Administered 2016-04-01 – 2016-04-02 (×2): 3 [IU] via SUBCUTANEOUS

## 2016-04-01 NOTE — ED Notes (Signed)
Spoke with MD Lorin Mercy regarding continued need for insulin drip for this patient. At this time MD advises that patient's CO2 readings are to high (currently at 19) to remove insulin drip.

## 2016-04-01 NOTE — Progress Notes (Signed)
PROGRESS NOTE  Jared Murray [id number] DOB: [birth year] DOA: 03/31/2016 PCP: [name], MD  HPI/Recap of past 24 hours: Jared Murray is a 40 y.o. male with medical history significant for type 1 diabetes mellitus, GERD, psoriasis, and eczema who presents the emergency department with nausea, vomiting, and polyuria which began early in the morning of his presentation. He was diagnosed with DKA and possible pericarditis, was started on insulin drip and aggressive fluid resuscitation. He has had no chest pain, and his anion gap, AKI and hyperglycemia quickly resolved.   This AM, he is resting comfortably in the stepdown unit. Denies any nausea, pain, cough, diarrhea, etc.    Assessment/Plan: Principal Problem:   Diabetic ketoacidosis without coma associated with type 1 diabetes mellitus ([place]) - on insulin drip and NPO, with D5 1/2NS running - gap almost closed, check BMP now - if gap closed, will d/c IVF/insulin drip, start liquid diet and dose Lantus - diabetic educator consult - A1c will be checked  Active Problems:   GERD (gastroesophageal reflux disease)   Hyperkalemia - resolved   Diabetes type 1, uncontrolled (H[facility])   Leukocytosis   Generalized anxiety disorder   Acute pericarditis - cont NSAIDs, TTE pending.  DVT ppx - Lovenox  Code Status: FULL   Family Communication: None present    Disposition Plan: Home in 24 hours.   Consultants:  None   Procedures:  None   Antimicrobials:  None    Objective: Vitals:   04/01/16 0500 04/01/16 0530 04/01/16 0600 04/01/16 0715  BP: 101/71 110/84 110/91   Pulse: 96 90 90   Resp: 16 16 17    Temp:    98.4 F (36.9 C)  TempSrc:    Oral  SpO2: 100% 100% 100%   Weight:      Height:        Intake/Output Summary (Last 24 hours) at 04/01/16 0957 Last data filed at 04/01/16 0700  Gross per 24 hour  Intake          3896.87 ml  Output              625 ml  Net          3271.87 ml   Filed Weights   03/31/16 1724  Weight: 60.3 kg (133 lb)    Exam: General:  Alert, oriented, calm, in no acute distress Eyes: pupils round and reactive to light and accomodation, clear sclerea Neck: supple, no masses, trachea mildline  Cardiovascular: RRR, no murmurs or rubs, no peripheral edema  Respiratory: clear to auscultation bilaterally, no wheezes, no crackles  Abdomen: soft, nontender, nondistended, normal bowel tones heard  Skin: dry, no rashes  Musculoskeletal: no joint effusions, normal range of motion  Psychiatric: appropriate affect, normal speech  Neurologic: extraocular muscles intact, clear speech, moving all extremities with intact sensorium    Data Reviewed: CBC:  Recent Labs Lab 03/31/16 1833 03/31/16 1839  WBC 16.6*  --   NEUTROABS 15.0*  --   HGB 16.4 17.0  HCT 48.0 50.0  MCV 95.6  --   PLT 259  --    Basic Metabolic Panel:  Recent Labs Lab 03/31/16 1834 03/31/16 1839 03/31/16 2130 04/01/16 0158 04/01/16 0456  NA 134* 132* 136 137 137  K 5.9* 5.7* 4.9 4.3 3.9  CL 97* 103 105 112* 113*  CO2 <7*  --  8* 16* 19*  GLUCOSE 573* 590* 420* 210* 114*  BUN 21* 22* 19 14 12   CREATININE 1.88* 0.80 1.66*  1.23 0.96  CALCIUM 8.9  --  8.6* 7.9* 8.1*   GFR: Estimated Creatinine Clearance: 88.1 mL/min (by C-G formula based on SCr of 0.96 mg/dL). Liver Function Tests:  Recent Labs Lab 03/31/16 1834  AST 31  ALT 19  ALKPHOS 151*  BILITOT 3.5*  PROT 7.6  ALBUMIN 4.6    Recent Labs Lab 03/31/16 1834  LIPASE 99*   No results for input(s): AMMONIA in the last 168 hours. Coagulation Profile: No results for input(s): INR, PROTIME in the last 168 hours. Cardiac Enzymes:  Recent Labs Lab 03/31/16 1828 04/01/16 0200 04/01/16 0456  TROPONINI 0.06* <0.03 <0.03   BNP (last 3 results) No results for input(s): PROBNP in the last 8760 hours. HbA1C: No results for input(s): HGBA1C in the last 72 hours. CBG:  Recent Labs Lab 04/01/16 0505 04/01/16 0617  04/01/16 0641 04/01/16 0758 04/01/16 0906  GLUCAP 102* 66 109* 91 97   Lipid Profile: No results for input(s): CHOL, HDL, LDLCALC, TRIG, CHOLHDL, LDLDIRECT in the last 72 hours. Thyroid Function Tests: No results for input(s): TSH, T4TOTAL, FREET4, T3FREE, THYROIDAB in the last 72 hours. Anemia Panel: No results for input(s): VITAMINB12, FOLATE, FERRITIN, TIBC, IRON, RETICCTPCT in the last 72 hours. Urine analysis:    Component Value Date/Time   COLORURINE YELLOW 03/31/2016 1850   APPEARANCEUR CLEAR 03/31/2016 1850   LABSPEC 1.028 03/31/2016 1850   PHURINE 5.0 03/31/2016 1850   GLUCOSEU >1000 (A) 03/31/2016 1850   HGBUR NEGATIVE 03/31/2016 1850   BILIRUBINUR NEGATIVE 03/31/2016 1850   BILIRUBINUR neg 03/16/2015 1230   KETONESUR >80 (A) 03/31/2016 1850   PROTEINUR NEGATIVE 03/31/2016 1850   UROBILINOGEN 0.2 06/25/2015 0913   NITRITE NEGATIVE 03/31/2016 1850   LEUKOCYTESUR NEGATIVE 03/31/2016 1850   Sepsis Labs: @LABRCNTIP (procalcitonin:4,lacticidven:4)  )No results found for this or any previous visit (from the past 240 hour(s)).    Studies: Dg Chest 1 View  Result Date: 03/31/2016 CLINICAL DATA:  Shortness of breath and nausea for 1 day. Diabetic ketoacidosis EXAM: CHEST 1 VIEW COMPARISON:  August 23, 2015 FINDINGS: Lungs are clear. Heart size and pulmonary vascularity are normal. No adenopathy. No bone lesions. IMPRESSION: No edema or consolidation. Electronically Signed   By: Lowella Grip III M.D.   On: 03/31/2016 19:53   US Abdomen Limited Ruq  Result Date: 03/31/2016 CLINICAL DATA:  Bilaterally EXAM: US ABDOMEN LIMITED - RIGHT UPPER QUADRANT COMPARISON:  None. FINDINGS: Gallbladder: No gallstones or wall thickening visualized. There is no pericholecystic fluid. No sonographic Murphy sign noted by sonographer. Common bile duct: Diameter: 1 mm. There is no intrahepatic or extrahepatic biliary duct dilatation. Liver: No focal lesion identified. Within normal limits in  parenchymal echogenicity. IMPRESSION: Study within normal limits. Electronically Signed   By: Lowella Grip III M.D.   On: 03/31/2016 21:16    Scheduled Meds: . busPIRone  10 mg Oral TID  . enoxaparin (LOVENOX) injection  40 mg Subcutaneous Q24H  . famotidine (PEPCID) IV  20 mg Intravenous Q12H  . hydrocortisone cream  1 application Topical BID  . ibuprofen  800 mg Oral TID  . nicotine  14 mg Transdermal Daily  . pantoprazole  40 mg Oral Daily    Continuous Infusions: . sodium chloride Stopped (04/01/16 0521)  . dextrose 5 % and 0.45% NaCl 100 mL/hr at 04/01/16 0700  . insulin (NOVOLIN-R) infusion Stopped (04/01/16 0900)     LOS: 1 day   Time spent: 25 minutes  Brystal Kildow Marry Guan, MD [facility] Pager [phone]  If 7PM-7AM, please contact night-coverage [email or web address] Password [facility] 04/01/2016, 9:57 AM

## 2016-04-01 NOTE — Progress Notes (Signed)
Pt to tx to [place] per MD order, pt VSS, pt not on tele, pt verbalized understanding of tx, pt to update family, report called to receiving RN, all questions answered, nursing will cont to monitor

## 2016-04-02 ENCOUNTER — Inpatient Hospital Stay (HOSPITAL_COMMUNITY): Payer: Medicaid Other

## 2016-04-02 DIAGNOSIS — R7989 Other specified abnormal findings of blood chemistry: Secondary | ICD-10-CM

## 2016-04-02 LAB — HEMOGLOBIN A1C
HEMOGLOBIN A1C: 11.9 % — AB (ref 4.8–5.6)
Mean Plasma Glucose: 295 mg/dL

## 2016-04-02 LAB — GLUCOSE, CAPILLARY
GLUCOSE-CAPILLARY: 167 mg/dL — AB (ref 65–99)
GLUCOSE-CAPILLARY: 55 mg/dL — AB (ref 65–99)
GLUCOSE-CAPILLARY: 74 mg/dL (ref 65–99)
Glucose-Capillary: 68 mg/dL (ref 65–99)
Glucose-Capillary: 75 mg/dL (ref 65–99)
Glucose-Capillary: 172 mg/dL — ABNORMAL HIGH (ref 65–99)

## 2016-04-02 LAB — ECHOCARDIOGRAM COMPLETE
HEIGHTINCHES: 70 in
Weight: 2128 oz

## 2016-04-02 LAB — ANTINUCLEAR ANTIBODIES, IFA: ANA Ab, IFA: NEGATIVE

## 2016-04-02 LAB — TROPONIN I
Troponin I: <0.03 ng/mL (ref <0.03)
Troponin I: <0.03 ng/mL (ref <0.03)

## 2016-04-02 MED ORDER — INSULIN GLARGINE 100 UNIT/ML ~~LOC~~ SOLN: 20.0000 [IU] | Freq: Every day | SUBCUTANEOUS | 5 refills | Status: DC

## 2016-04-02 NOTE — Hospital Discharge Follow-Up (Signed)
Transitional Care Clinic Care Coordination Note:  Admit date:  03/31/16 Discharge date: ?04/02/16 Discharge Disposition: Home when stable Patient contact: [phone] Emergency contact(s): none  This Case Manager reviewed patient's EMR and determined patient would benefit from post-discharge medical management and chronic care management services through the [facility]. Patient has a history of type 1 diabetes mellitus. Admitted for DKA. This Case Manager met with patient to discuss the services and medical management that can be provided at the [facility]. Patient verbalized understanding and agreed to receive post-discharge care at the [facility].   Patient scheduled for Transitional Care appointment on-To be determined. Patient plans to discuss appointment options with his spouse prior to appointment being scheduled.  Clinic information and appointment time provided to patient. Appointment information also placed on AVS.  Assessment:       Home Environment: Patient lives with his spouse in a private residence.       Support System: Spouse       Level of functioning: Independent       Home DME: none       Home care services: Patient indicates he is still followed by [facility].       Transportation: Patient has a vehicle and typically drives to his medical appointments. However, he indicates his spouse is starting a new job, and he wants to speak with her about appointment options prior to appointment being made so he can ensure he has transportation. Informed patient he may benefit from completing transportation assessment with [facility] in case transportation needed. Also informed patient that [place] able to provide transportation to his appointment if needed. Patient appreciative of information.        Food/Nutrition: Patient indicates he has access to needed food.  Medications: Patient gets his medications from [facility] on [address]. Patient denies problems obtaining his medications. Patient indicated he has a glucometer and diabetes testing supplies at home. Patient also indicates he has his insulin at home and reports compliance.        Identified Barriers: possible transportation needs        PCP: Dr. Zenaida [facility]  Patient Education:            Arranged services:        Services communicated to Whitman Hero, RN CM

## 2016-04-02 NOTE — Progress Notes (Signed)
Initial Nutrition Assessment  DOCUMENTATION CODES:   Severe malnutrition in context of chronic illness  INTERVENTION:   -Glucerna Shake po TID, each supplement provides 220 kcal and 10 grams of protein -Provided pt with Glucerna coupons per his request  NUTRITION DIAGNOSIS:   Malnutrition related to chronic illness as evidenced by severe depletion of body fat, severe depletion of muscle mass.  GOAL:   Patient will meet greater than or equal to 90% of their needs  MONITOR:   PO intake, Supplement acceptance, Labs, Weight trends, Skin, I & O's  REASON FOR ASSESSMENT:   Malnutrition Screening Tool    ASSESSMENT:   Jared Murray is a 40 y.o. male with medical history significant for type 1 diabetes mellitus, GERD, psoriasis, and eczema who presents the emergency department with nausea, vomiting, and polyuria which began early in the morning of his presentation.  Pt admitted with DKA (type 1 DM).   Hx obtained from pt at bedside. He reveals that he was diagnosed with DM approximately 3 years ago. He reports overall good DM control over the past 2 years ("once I got the hang of things). He confirms home medication regimen of 20 units lantus q HS and sliding scale Novolog. PCP is Jar[facility] and pt reports no difficulty otbaining or administering medications, although pt reports he prefers to give injections in arm instead of his stomach. Encouraged pt to rotate injection sites when administering insulin.   Pt reports he generally consumes 2 meals per day and one snack (meals consist of baked protein, starch, and vegetable, and snack is typically peanut butter and crackers or fruit). Pt reports that he usually prepares his own food at home is was able to teach back DM diet principles to this RD. He voiced interest in attending DM classes, but reports he and his wife have not had the time to attend yet.   Pt confirms he lost weight prior to DM  diagnosis, but has had difficulty gaining wt back. He has been using Glucerna supplements intermittently, but often does not have funds to afford them consistently. Pt also occasionally buys the generic brand supplements. Discussed ways that pt could inrcease protein in diet to preserve lean muscle mass, while adding additional calories and protein without compromising blood sugar control. Also provided Glucerna coupons per his request.   Nutrition-Focused physical exam completed. Findings are moderate to severe fat depletion, moderate to severe muscle depletion, and no edema.   Case discussed with RN. Confirmed potential d/c to home today.   Labs reviewed. Last Hgb A1c: 11.9.   Diet Order:  Diet Carb Modified Fluid consistency: Thin; Room service appropriate? Yes Diet - low sodium heart healthy  Skin:  Reviewed, no issues  Last BM:  03/31/16  Height:   Ht Readings from Last 1 Encounters:  03/31/16 5\' 10"  (1.778 m)    Weight:   Wt Readings from Last 1 Encounters:  03/31/16 133 lb (60.3 kg)    Ideal Body Weight:  75.5 kg  BMI:  Body mass index is 19.08 kg/m.  Estimated Nutritional Needs:   Kcal:  1800-2000  Protein:  90-105 grams  Fluid:  1.8-2.0 L  EDUCATION NEEDS:   Education needs addressed  Lyrica Mcclarty A. Jimmye Norman, RD, LDN, CDE Pager: [phone] After hours Pager: [phone]

## 2016-04-02 NOTE — Discharge Summary (Signed)
Physician Discharge Summary  Jared Murray [id number] DOB: 04-01-1976 DOA: 03/31/2016  PCP: [name], MD  Admit date: 03/31/2016 Discharge date: 04/02/2016  Time spent: 35 minutes  Recommendations for Outpatient Follow-up:  1. Please follow up on blood sugars, he was admitted for DKA, given new script for Lantus 20 units [place] q hs   Discharge Diagnoses:  Principal Problem:   Diabetic ketoacidosis without coma associated with type 1 diabetes mellitus ([place]) Active Problems:   GERD (gastroesophageal reflux disease)   Hyperkalemia   Diabetes type 1, uncontrolled ([place])   DKA (diabetic ketoacidoses) (HCC)   Leukocytosis   Generalized anxiety disorder   Acute pericarditis   Discharge Condition: Stable  Diet recommendation: Carb Modified   Filed Weights   03/31/16 1724  Weight: 60.3 kg (133 lb)    History of present illness:  Jared Murray is a 40 y.o. male with medical history significant for type 1 diabetes mellitus, GERD, psoriasis, and eczema who presents the emergency department with nausea, vomiting, and polyuria which began early in the morning of his presentation. Patient was in his usual state of health when going to bed last night and reports strict adherence to his insulin regimen. He woke approximately 3 AM this morning with nausea, vomiting, and polyuria. He denies fevers or chills, cough, or dyspnea. He also denies abdominal pain, diarrhea, melena, or hematochezia. Patient reports some epigastric pain which she attributes to vomiting, describing it as localized to the lower central chest, waxing and waning, worse with laying on his side, and with no alleviating factors identified. Patient denies any recent alcohol or illicit drug use. He denies any new rash or wound. He denies recent rhinorrhea or sore throat. There is no headache, neck stiffness, phono- or photophobia, or focal numbness or weakness.   Hospital Course:  Jared Murray a 39 y.o.malewith  medical history significant for type 1 diabetes mellitus, GERD, psoriasis, and eczema who presented to the emergency department on 03/31/2016 with nausea, vomiting, and polyuria which began early in the morning of his presentation. He was diagnosed with DKA, having BS of 420, anion gap of 23, was started on insulin drip and aggressive fluid resuscitation. Overnight his anion gap closed as he was transitioned to his basal insulin. There was question of pericarditis although he did not report chest pain or shortness of breath. 2D echo did not show pericardial effusion and troponins where undetectable.    Given clinical improvement he was discharged in stable condition on 04/02/2016.   Discharge Exam: Vitals:   04/02/16 0017 04/02/16 0405  BP: 117/86 123/83  Pulse: 98 87  Resp: 18 18  Temp: 98.5 F (36.9 C) 98.8 F (37.1 C)    General: He is awake and alert, nontoxic appearing, looking forward to going home today Cardiovascular: RRR, NL S1S2 Respiratory: Normal inspiratory effort, no wheezing crackles or rales Abdomen: Soft, nontender nondistended  Discharge Instructions   Discharge Instructions    Call MD for:    Complete by:  As directed   Call MD for:  difficulty breathing, headache or visual disturbances    Complete by:  As directed   Call MD for:  extreme fatigue    Complete by:  As directed   Call MD for:  hives    Complete by:  As directed   Call MD for:  persistant dizziness or light-headedness    Complete by:  As directed   Call MD for:  persistant nausea and vomiting    Complete  by:  As directed   Call MD for:  redness, tenderness, or signs of infection (pain, swelling, redness, odor or green/yellow discharge around incision site)    Complete by:  As directed   Call MD for:  severe uncontrolled pain    Complete by:  As directed   Call MD for:  temperature >100.4    Complete by:  As directed   Diet - low sodium heart healthy    Complete by:  As directed   Increase activity slowly     Complete by:  As directed     Current Discharge Medication List    CONTINUE these medications which have CHANGED   Details  insulin glargine (LANTUS) 100 UNIT/ML injection Inject 0.2 mLs (20 Units total) into the skin at bedtime. 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 Qty: 90 tablet, Refills: 3   Associated Diagnoses: Generalized anxiety disorder    NOVOLOG 100 UNIT/ML injection Inject 0-12 Units into the skin 3 (three) times daily with meals. 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 Qty: 30 g, Refills: 2   Associated Diagnoses: Eczema    hydrocortisone 2.5 % cream Apply 1 application topically 2 (two) times daily as needed.     Insulin Syringe-Needle U-100 (B-D INS SYRINGE 2CC/29GX1/2") 29G X 1/2" 2 ML MISC Check Blood sugar TID and QHS Qty: 100 each, Refills: 12   Associated Diagnoses: Type II or unspecified type diabetes mellitus without  mention of complication, uncontrolled    Lancet Devices (ACCU-CHEK SOFTCLIX) lancets Use as instructed three times daily. 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 Qty: 28 patch, Refills: 1   Associated Diagnoses: Tobacco use disorder    Olopatadine HCl 0.2 % SOLN Place 1 drop in each eye twice daily Qty: 2.5 mL, Refills: 2       Allergies  Allergen Reactions  . Latex Itching and Rash  . Penicillins Other (See Comments)    Childhood allergy Has patient had a PCN reaction causing immediate rash, facial/tongue/throat swelling, SOB or lightheadedness with hypotension: NO Has patient had a PCN reaction causing severe rash involving mucus membranes or skin necrosis:NO Has patient had a PCN reaction that required hospitalization NO Has  patient had a PCN reaction occurring within the last 10 years: NO If all of the above answers are "NO", then may proceed with Cephalosporin use.   Follow-up Information    [name], MD Follow up in 1 week(s).   Specialty:  Family Medicine Contact information: [place] [address] [phone]            The results of significant diagnostics from this hospitalization (including imaging, microbiology, ancillary and laboratory) are listed below for reference.    Significant Diagnostic Studies: Dg Chest 1 View  Result Date: 03/31/2016 CLINICAL DATA:  Shortness of breath and nausea for 1 day. Diabetic ketoacidosis EXAM: CHEST 1 VIEW COMPARISON:  August 23, 2015 FINDINGS: Lungs are clear. Heart size and pulmonary vascularity are normal. No adenopathy. No bone lesions. IMPRESSION: No edema or consolidation. Electronically Signed   By: Lowella Grip III M.D.   On: 03/31/2016 19:53   US Abdomen Limited Ruq  Result Date: 03/31/2016 CLINICAL  DATA:  Bilaterally EXAM: US ABDOMEN LIMITED - RIGHT UPPER QUADRANT COMPARISON:  None. FINDINGS: Gallbladder: No gallstones or wall thickening visualized. There is no pericholecystic fluid. No sonographic Murphy sign noted by sonographer. Common bile duct: Diameter: 1 mm. There is no intrahepatic or extrahepatic biliary duct dilatation. Liver: No focal lesion identified. Within normal limits in parenchymal echogenicity. IMPRESSION: Study within normal limits. Electronically Signed   By: Lowella Grip III M.D.   On: 03/31/2016 21:16    Microbiology: Recent Results (from the past 240 hour(s))  Culture, blood (Routine X 2) w Reflex to ID Panel     Status: None (Preliminary result)   Collection Time: 03/31/16  8:20 PM  Result Value Ref Range Status   Specimen Description BLOOD RIGHT ANTECUBITAL  Final   Special Requests IN PEDIATRIC BOTTLE 2CC  Final   Culture NO GROWTH < 24 HOURS  Final   Report Status PENDING  Incomplete  Culture, blood (Routine X 2) w Reflex to ID Panel     Status: None (Preliminary result)   Collection Time: 03/31/16  8:27 PM  Result Value Ref Range Status   Specimen Description BLOOD RIGHT ARM  Final   Special Requests IN PEDIATRIC BOTTLE 2CC  Final   Culture NO GROWTH < 24 HOURS  Final   Report Status PENDING  Incomplete  MRSA PCR Screening     Status: None   Collection Time: 04/01/16  9:12 AM  Result Value Ref Range Status   MRSA by PCR NEGATIVE NEGATIVE Final    Comment:        The GeneXpert MRSA Assay (FDA approved for NASAL specimens only), is one component of a comprehensive MRSA colonization surveillance program. It is not intended to diagnose MRSA infection nor to guide or monitor treatment for MRSA infections.      Labs: Basic Metabolic Panel:  Recent Labs Lab 03/31/16 2130 04/01/16 0158 04/01/16 0456 04/01/16 0935 04/01/16 1641  NA 136 137 137 138 133*  K 4.9 4.3 3.9 4.4 3.8  CL 105 112* 113* 111 105  CO2 8* 16* 19* 20* 19*  GLUCOSE  420* 210* 114* 106* 222*  BUN 19 14 12 11 8   CREATININE 1.66* 1.23 0.96 0.80 0.71  CALCIUM 8.6* 7.9* 8.1* 8.3* 8.3*   Liver Function Tests:  Recent Labs Lab 03/31/16 1834  AST 31  ALT 19  ALKPHOS 151*  BILITOT 3.5*  PROT 7.6  ALBUMIN 4.6    Recent Labs Lab 03/31/16 1834  LIPASE 99*  No results for input(s): AMMONIA in the last 168 hours. CBC:  Recent Labs Lab 03/31/16 1833 03/31/16 1839  WBC 16.6*  --   NEUTROABS 15.0*  --   HGB 16.4 17.0  HCT 48.0 50.0  MCV 95.6  --   PLT 259  --    Cardiac Enzymes:  Recent Labs Lab 04/01/16 0456 04/01/16 0935 04/01/16 1641 04/01/16 2301 04/02/16 0706  TROPONINI <0.03 <0.03 <0.03 <0.03 <0.03   BNP: BNP (last 3 results) No results for input(s): BNP in the last 8760 hours.  ProBNP (last 3 results) No results for input(s): PROBNP in the last 8760 hours.  CBG:  Recent Labs Lab 04/02/16 0059 04/02/16 0227 04/02/16 0850 04/02/16 0911 04/02/16 1143  GLUCAP 74 167* 68 75 172*       Signed:  Kelvin Cellar MD.  [facility] 04/02/2016, 1:22 PM

## 2016-04-02 NOTE — Progress Notes (Signed)
  Echocardiogram 2D Echocardiogram has been performed.  Jared Murray 04/02/2016, 10:06 AM

## 2016-04-02 NOTE — Care Management Note (Signed)
Case Management Note  Patient Details  Name: Jared Murray MRN: [id number] Date of Birth: 03/01/1976  Subjective/Objective:    Admitted with DKA, ? pericarditis, hx of   type 1 diabetes mellitus, GERD, psoriasis, and eczema.          Action/Plan: Plan is to d/c to home today contingent on echo results. CM to f/u with any d/c needs.  Expected Discharge Date:   04/02/2016    Expected Discharge Plan:  Home/Self Care  In-House Referral:     Discharge planning Services  CM Consult  Post Acute Care Choice:    Choice offered to:     DME Arranged:    DME Agency:     HH Arranged:    HH Agency:     Status of Service:  Completed, signed off  If discussed at [employer] of Stay Meetings, dates discussed:    Additional Comments:  Sharin Mons, RN 04/02/2016, 10:37 AM

## 2016-04-02 NOTE — Progress Notes (Signed)
Hypoglycemic Event  CBG: 68 (0850)  Treatment: 15 GM carbohydrate snack  Symptoms: None  Follow-up CBG: Time:0911 CBG Result:75  Possible Reasons for Event: Unknown  Comments/MD notified: No new orders were placed    Jared Murray

## 2016-04-02 NOTE — Progress Notes (Signed)
Pt BP elevated 135/94 (1451), 126/93 (1601). Pt reported it was due to being anxious about leaving and wife was waiting for him outside. Pt refused to wait for nurse to notify MD.

## 2016-04-02 NOTE — Progress Notes (Signed)
NURSING PROGRESS NOTE  ROWNAN KENAN [id number] Discharge Data: 04/02/2016 4:17 PM Attending Provider: Kelvin Cellar, MD [id number], Jarold Song, MD     [place] to be D/C'd Home per MD order.  Discussed with the patient the After Visit Summary and all questions fully answered. All IV's discontinued with no bleeding noted. All belongings returned to patient for patient to take home.   Last Vital Signs:  Blood pressure (!) 126/93, pulse 77, temperature 97.6 F (36.4 C), temperature source Oral, resp. rate 20, height 5\' 10"  (1.778 m), weight 60.3 kg (133 lb), SpO2 99 %.  Discharge Medication List   Medication List    TAKE these medications   ACCU-CHEK AVIVA device Use as instructed   ACCU-CHEK AVIVA PLUS test strip Generic drug:  glucose blood USE TO CHECK BLOOD SUGAR FOUR TIMES DAILY BEFORE MEALS AND AT BEDTIME   accu-chek softclix lancets Use as instructed three times daily.   acetaminophen-codeine 300-30 MG tablet Commonly known as:  TYLENOL #3 TAKE 1 TABLET BY MOUTH EVERY 8 HOURS AS NEEDED FOR MODERATE PAIN   busPIRone 10 MG tablet Commonly known as:  BUSPAR Take 1 tablet (10 mg total) by mouth 3 (three) times daily. What changed:  when to take this  reasons to take this   clobetasol cream 0.05 % Commonly known as:  TEMOVATE Apply 1 application topically 2 (two) times daily. What changed:  when to take this  reasons to take this   hydrocortisone 2.5 % cream Apply 1 application topically 2 (two) times daily as needed.   insulin glargine 100 UNIT/ML injection Commonly known as:  LANTUS Inject 0.2 mLs (20 Units total) into the skin at bedtime.   Insulin Syringe-Needle U-100 29G X 1/2" 2 ML Misc Commonly known as:  B-D INS SYRINGE 2CC/29GX1/2" Check Blood sugar TID and QHS   nicotine 14 mg/24hr patch Commonly known as:  NICODERM CQ - dosed in mg/24 hours Place 1 patch (14 mg total) onto the skin daily.   NOVOLOG 100 UNIT/ML injection Generic  drug:  insulin aspart Inject 0-12 Units into the skin 3 (three) times daily with meals. As per sliding scale   Olopatadine HCl 0.2 % Soln Place 1 drop in each eye twice daily   pantoprazole 40 MG tablet Commonly known as:  PROTONIX TAKE 1 TABLET BY MOUTH DAILY What changed:  See the new instructions.

## 2016-04-03 ENCOUNTER — Telehealth: Payer: Self-pay

## 2016-04-03 NOTE — Telephone Encounter (Signed)
Transitional Care Clinic Post-discharge Follow-Up Phone Call:  Date of Discharge: 04/02/16 Principal Discharge Diagnosis(es): Diabetic ketoacidosis without coma associated with type 1 diabetes mellitus Call Completed: Yes                   With Whom: Patient     Please check all that apply:  X  Patient is knowledgeable of his/her condition(s) and/or treatment. X Patient is caring for self at home.  ? Patient is receiving assist at home from family and/or caregiver. Family and/or caregiver is knowledgeable of patient's condition(s) and/or treatment. ? Patient is receiving home health services. If so, name of agency.     Medication Reconciliation:  ? Medication list reviewed with patient. X  Patient obtained all discharge medications-Yes. This Case Manager reviewed with patient that his Lantus dose has changed. Patient's Lantus dose is now 20 Units at bedtime. Patient indicated he was aware of this change. He indicated his blood glucose was "118 this morning." Inquired about his blood glucose reading last night, but patient indicated he was unable to remember. Instructed patient to begin keeping a blood glucose log so it can be reviewed by Dr. Jarold Song at his appointment. Patient verbalized understanding. Attempted to review patient's complete medication list with him, but he indicated he did not need to review list. Advised patient to bring all his medications to his appointment.   Activities of Daily Living:  X  Independent ? Needs assist (describe; ? home DME used) ? [facility] (describe, ? home DME used)   Community resources in place for patient:  X  [facility] left for Heath Lark, liaison with [place], to inform patient will be followed by the [facility] and [facility] ? Home Health/Home DME ? Assisted Living ? Support Group        Questions/Concerns discussed: This Case Manager placed  call to patient to check on status. Patient indicated he was doing "fine" and had no complaints or health concerns at this time. Informed patient that a [facility] appointment needs to be scheduled, and patient indicated he will have a vehicle he can use to get to appointment on 04/10/16. Appointment scheduled for 04/10/16 at 1130 with Dr. Jarold Song. Patient appreciative of appointment and indicated he will have a way to get to his appointment. No additional needs/concerns identified.

## 2016-04-05 LAB — CULTURE, BLOOD (ROUTINE X 2)
CULTURE: NO GROWTH
CULTURE: NO GROWTH

## 2016-04-09 ENCOUNTER — Telehealth: Payer: Self-pay

## 2016-04-09 NOTE — Telephone Encounter (Signed)
Attempted to contact the patient to inquire how is he is feeling and to remind him of his appointment at the [facility] tomorrow, 04/10/16 @ 1130. Call placed to [phone] and a HIPAA compliant voicemail message was left requesting a call back to # [phone] or [phone].

## 2016-04-09 NOTE — Telephone Encounter (Signed)
Call placed to Heath Lark, [facility] Liaison at [facility] # [phone] and confirmed that the patient is still active with [facility] and the nurse just spoke to him yesterday and he informed her that he is aware that he has an appointment at [facility] on 04/10/16 and he has transportation to the clinic.

## 2016-04-10 ENCOUNTER — Ambulatory Visit: Payer: Medicaid Other | Attending: Family Medicine | Admitting: Family Medicine

## 2016-04-10 ENCOUNTER — Encounter: Payer: Self-pay | Admitting: Family Medicine

## 2016-04-10 VITALS — BP 93/63 | HR 115 | Temp 98.1°F | Ht 70.0 in | Wt 127.8 lb

## 2016-04-10 DIAGNOSIS — M8788 Other osteonecrosis, other site: Secondary | ICD-10-CM | POA: Diagnosis not present

## 2016-04-10 DIAGNOSIS — L309 Dermatitis, unspecified: Secondary | ICD-10-CM

## 2016-04-10 DIAGNOSIS — Z794 Long term (current) use of insulin: Secondary | ICD-10-CM | POA: Insufficient documentation

## 2016-04-10 DIAGNOSIS — E1010 Type 1 diabetes mellitus with ketoacidosis without coma: Secondary | ICD-10-CM

## 2016-04-10 DIAGNOSIS — M87059 Idiopathic aseptic necrosis of unspecified femur: Secondary | ICD-10-CM

## 2016-04-10 DIAGNOSIS — E1029 Type 1 diabetes mellitus with other diabetic kidney complication: Secondary | ICD-10-CM

## 2016-04-10 DIAGNOSIS — L409 Psoriasis, unspecified: Secondary | ICD-10-CM | POA: Diagnosis not present

## 2016-04-10 DIAGNOSIS — E1065 Type 1 diabetes mellitus with hyperglycemia: Secondary | ICD-10-CM | POA: Diagnosis present

## 2016-04-10 DIAGNOSIS — E108 Type 1 diabetes mellitus with unspecified complications: Secondary | ICD-10-CM

## 2016-04-10 LAB — GLUCOSE, POCT (MANUAL RESULT ENTRY): POC GLUCOSE: 217 mg/dL — AB (ref 70–99)

## 2016-04-10 MED ORDER — CLOBETASOL PROPIONATE 0.05 % EX CREA: 1.0000 "application " | TOPICAL_CREAM | Freq: Two times a day (BID) | CUTANEOUS | 2 refills | Status: DC

## 2016-04-10 MED ORDER — PANTOPRAZOLE SODIUM 40 MG PO TBEC: 40.0000 mg | DELAYED_RELEASE_TABLET | Freq: Every day | ORAL | 2 refills | Status: DC

## 2016-04-10 MED ORDER — ACCU-CHEK SOFTCLIX LANCET DEV MISC: 5 refills | Status: DC

## 2016-04-10 MED ORDER — GLUCOSE BLOOD VI STRP: ORAL_STRIP | 5 refills | Status: DC

## 2016-04-10 NOTE — Patient Instructions (Signed)
Diabetes Mellitus and Food It is important for you to manage your blood sugar (glucose) level. Your blood glucose level can be greatly affected by what you eat. Eating healthier foods in the appropriate amounts throughout the day at about the same time each day will help you control your blood glucose level. It can also help slow or prevent worsening of your diabetes mellitus. Healthy eating may even help you improve the level of your blood pressure and reach or maintain a healthy weight.  General recommendations for healthful eating and cooking habits include:  Eating meals and snacks regularly. Avoid going long periods of time without eating to lose weight.  Eating a diet that consists mainly of plant-based foods, such as fruits, vegetables, nuts, legumes, and whole grains.  Using low-heat cooking methods, such as baking, instead of high-heat cooking methods, such as deep frying. Work with your dietitian to make sure you understand how to use the Nutrition Facts information on food labels. HOW CAN FOOD AFFECT ME? Carbohydrates Carbohydrates affect your blood glucose level more than any other type of food. Your dietitian will help you determine how many carbohydrates to eat at each meal and teach you how to count carbohydrates. Counting carbohydrates is important to keep your blood glucose at a healthy level, especially if you are using insulin or taking certain medicines for diabetes mellitus. Alcohol Alcohol can cause sudden decreases in blood glucose (hypoglycemia), especially if you use insulin or take certain medicines for diabetes mellitus. Hypoglycemia can be a life-threatening condition. Symptoms of hypoglycemia (sleepiness, dizziness, and disorientation) are similar to symptoms of having too much alcohol.  If your health care provider has given you approval to drink alcohol, do so in moderation and use the following guidelines:  Women should not have more than one drink per day, and men  should not have more than two drinks per day. One drink is equal to:  12 oz of beer.  5 oz of wine.  1 oz of hard liquor.  Do not drink on an empty stomach.  Keep yourself hydrated. Have water, diet soda, or unsweetened iced tea.  Regular soda, juice, and other mixers might contain a lot of carbohydrates and should be counted. WHAT FOODS ARE NOT RECOMMENDED? As you make food choices, it is important to remember that all foods are not the same. Some foods have fewer nutrients per serving than other foods, even though they might have the same number of calories or carbohydrates. It is difficult to get your body what it needs when you eat foods with fewer nutrients. Examples of foods that you should avoid that are high in calories and carbohydrates but low in nutrients include:  Trans fats (most processed foods list trans fats on the Nutrition Facts label).  Regular soda.  Juice.  Candy.  Sweets, such as cake, pie, doughnuts, and cookies.  Fried foods. WHAT FOODS CAN I EAT? Eat nutrient-rich foods, which will nourish your body and keep you healthy. The food you should eat also will depend on several factors, including:  The calories you need.  The medicines you take.  Your weight.  Your blood glucose level.  Your blood pressure level.  Your cholesterol level. You should eat a variety of foods, including:  Protein.  Lean cuts of meat.  Proteins low in saturated fats, such as fish, egg whites, and beans. Avoid processed meats.  Fruits and vegetables.  Fruits and vegetables that may help control blood glucose levels, such as apples, mangoes, and   yams.  Dairy products.  Choose fat-free or low-fat dairy products, such as milk, yogurt, and cheese.  Grains, bread, pasta, and rice.  Choose whole grain products, such as multigrain bread, whole oats, and brown rice. These foods may help control blood pressure.  Fats.  Foods containing healthful fats, such as nuts,  avocado, olive oil, canola oil, and fish. DOES EVERYONE WITH DIABETES MELLITUS HAVE THE SAME MEAL PLAN? Because every person with diabetes mellitus is different, there is not one meal plan that works for everyone. It is very important that you meet with a dietitian who will help you create a meal plan that is just right for you.   This information is not intended to replace advice given to you by your health care provider. Make sure you discuss any questions you have with your health care provider.   Document Released: 05/01/2005 Document Revised: 08/25/2014 Document Reviewed: 07/01/2013 Elsevier Interactive Patient Education 2016 Elsevier Inc.  

## 2016-04-10 NOTE — Progress Notes (Signed)
Hypotensive/tachycardia Hand rash- dermatology appt on 04/16/16

## 2016-04-11 NOTE — Progress Notes (Signed)
[facility]itional [facility]  Date of telephone encounter: 04/03/16  Hospitalization Dates: 8/14 - 04/02/16   Subjective:    Patient ID: Jared Murray, male    DOB: [birth year], 40 y.o.   MRN: [id number]  HPI Jared Murray is a 40 year old male with a history of uncontrolled type 1 diabetes mellitus (A1c 11.9 from today), noncompliance, multiple hospital admissions for DKA, avascular necrosis of both hips, psoriasis who comes into the clinic for a follow-up visit.  He had presented with nausea, vomiting, polyuria and a blood sugar of 421 and anion gap of 23. He was placed on an insulin drip and IV fluids. There was a question of pericarditis even though he had no chest pains and troponins were less than 0.03 but he underwent a 2-D echo which revealed normal ejection fraction with no evidence of pericarditis. He was subsequently discharged on 20 units of Lantus.  Compliance with a diabetic diet has been one of his major struggles as he has never been able to set up  meetings with the nutritionist due to his wife's schedule as he would like his wife to be in attendance with him since she makes his meals.  Fasting sugars are under 120 as per the patient but his random sugars reach from 300-400 and increase his Lantus usually resulted early morning hypoglycemia and so he is hesitant to go up on his dose of Lantus. He was seen by orthopedics for his avascular necrosis of the hip and surgery is on hold pending lycemic control. Appointment with Dermatology comes up on 04/16/16 for management of psoriasis.  Past Medical History:  Diagnosis Date  . DKA (diabetic ketoacidoses) ([place])   . Eczema   . GERD (gastroesophageal reflux disease)   . Psoriasis   . Type 1 diabetes mellitus ([place])     Past Surgical History:  Procedure Laterality Date  . FINGER SURGERY      Allergies  Allergen Reactions  . Latex Itching and Rash  . Penicillins Other (See Comments)    Childhood allergy Has patient  had a PCN reaction causing immediate rash, facial/tongue/throat swelling, SOB or lightheadedness with hypotension: NO Has patient had a PCN reaction causing severe rash involving mucus membranes or skin necrosis:NO Has patient had a PCN reaction that required hospitalization NO Has patient had a PCN reaction occurring within the last 10 years: NO If all of the above answers are "NO", then may proceed with Cephalosporin use.     Review of Systems Constitutional: Negative for activity change and appetite change.  HENT: Negative for sinus pressure and sore throat.   Eyes: Negative for visual disturbance.  Respiratory: Negative for cough, chest tightness and shortness of breath.   Cardiovascular: Negative for chest pain and leg swelling.  Gastrointestinal: Negative for abdominal pain, diarrhea, constipation and abdominal distention.  Endocrine: Negative.   Genitourinary: Negative for dysuria.  Musculoskeletal: Negative for myalgias and joint swelling.       Bilateral hip pain  Skin: Positive for rash (Psoriatic lesions on body.), hypopigmented lesions on hands Allergic/Immunologic: Negative.   Neurological: Negative for weakness, light-headedness and numbness.  Psychiatric/Behavioral: Negative for suicidal ideas and dysphoric mood.     Objective: Vitals:   04/10/16 1127  BP: 93/63  Pulse: (!) 115  Temp: 98.1 F (36.7 C)  TempSrc: Oral  SpO2: 99%  Weight: 127 lb 12.8 oz (58 kg)  Height: 5\' 10"  (1.778 m)      Physical Exam Constitutional: He is oriented to  person, place, and time. He appears well-developed and well-nourished.  Cardiovascular: Tachycardic heart rate, normal heart sounds and intact distal pulses.    No murmur heard. Pulmonary/Chest: Effort normal and breath sounds normal. He has no wheezes. He has no rales. He exhibits no tenderness.  Abdominal: Soft. Bowel sounds are normal. He exhibits no distension and no mass. There is no tenderness.  Musculoskeletal: mild  tenderness on ROM of hips  Neurological: He is alert and oriented to person, place, and time.  Skin:  Hypopigmented patches on dorsum of both hands, psoriatic lesions diffusely distributed on body surface.  Psychiatric:  Normal mood       Assessment & Plan:  1. 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 Continue Lantus 20 units at bedtime and continue NovoLog sliding scale Compliance emphasized Referred to endocrinology. - POCT A1C - Glucose (CBG) - insulin glargine (LANTUS) 100 UNIT/ML injection; Inject 0.2 mLs (20 Units total) into the skin at bedtime.  Dispense: 10 mL; Refill: 5  2. Avascular necrosis of bone of hip, unspecified laterality ([place]) Seen by orthopedics and surgery will depend on glycemic control.  3. Psoriasis Has an upcoming appointment with dermatology. Meanwhile continue trumps along cream

## 2016-04-17 ENCOUNTER — Telehealth: Payer: Self-pay

## 2016-04-17 NOTE — Telephone Encounter (Signed)
This Case Manager placed call to patient to check status. Call placed to [phone]; unable to reach patient. HIPPA compliant voicemail left requesting return call.

## 2016-04-23 ENCOUNTER — Telehealth: Payer: Self-pay

## 2016-04-23 NOTE — Telephone Encounter (Signed)
This Case Manager placed call to patient to check on status and to remind him of upcoming Transitional Care follow-up appointment on 04/24/16 at 1130. Unable to reach patient; HIPPA compliant voicemail left requesting return call.

## 2016-04-23 NOTE — Telephone Encounter (Signed)
This Case Manager received return call from patient. Patient reminded of upcoming appointment on 04/24/16 at 1130 with Dr. Jarold Song. Patient indicated he planned to be at his upcoming appointment. Patient indicated he is keeping a log of his blood glucose readings. Reminded patient to bring his blood glucose log and his medications to his upcoming appointment so Dr. Jarold Song can review. Patient verbalized understanding. No additional needs/concerns identified.

## 2016-04-24 ENCOUNTER — Ambulatory Visit: Payer: Medicaid Other | Attending: Family Medicine | Admitting: Family Medicine

## 2016-04-24 ENCOUNTER — Encounter: Payer: Self-pay | Admitting: Family Medicine

## 2016-04-24 VITALS — BP 99/68 | HR 98 | Temp 98.4°F | Ht 70.0 in | Wt 126.0 lb

## 2016-04-24 DIAGNOSIS — E1065 Type 1 diabetes mellitus with hyperglycemia: Secondary | ICD-10-CM | POA: Insufficient documentation

## 2016-04-24 DIAGNOSIS — K219 Gastro-esophageal reflux disease without esophagitis: Secondary | ICD-10-CM | POA: Diagnosis not present

## 2016-04-24 DIAGNOSIS — L409 Psoriasis, unspecified: Secondary | ICD-10-CM

## 2016-04-24 DIAGNOSIS — Z880 Allergy status to penicillin: Secondary | ICD-10-CM | POA: Insufficient documentation

## 2016-04-24 DIAGNOSIS — M87059 Idiopathic aseptic necrosis of unspecified femur: Secondary | ICD-10-CM | POA: Insufficient documentation

## 2016-04-24 DIAGNOSIS — Z91040 Latex allergy status: Secondary | ICD-10-CM | POA: Insufficient documentation

## 2016-04-24 LAB — GLUCOSE, POCT (MANUAL RESULT ENTRY): POC Glucose: 219 mg/dl — AB (ref 70–99)

## 2016-04-24 MED ORDER — ACETAMINOPHEN-CODEINE #3 300-30 MG PO TABS: ORAL_TABLET | ORAL | 1 refills | Status: DC

## 2016-04-24 NOTE — Progress Notes (Signed)
Stopped taking buspar

## 2016-04-24 NOTE — Patient Instructions (Signed)
Diabetes Mellitus and Food It is important for you to manage your blood sugar (glucose) level. Your blood glucose level can be greatly affected by what you eat. Eating healthier foods in the appropriate amounts throughout the day at about the same time each day will help you control your blood glucose level. It can also help slow or prevent worsening of your diabetes mellitus. Healthy eating may even help you improve the level of your blood pressure and reach or maintain a healthy weight.  General recommendations for healthful eating and cooking habits include:  Eating meals and snacks regularly. Avoid going long periods of time without eating to lose weight.  Eating a diet that consists mainly of plant-based foods, such as fruits, vegetables, nuts, legumes, and whole grains.  Using low-heat cooking methods, such as baking, instead of high-heat cooking methods, such as deep frying. Work with your dietitian to make sure you understand how to use the Nutrition Facts information on food labels. HOW CAN FOOD AFFECT ME? Carbohydrates Carbohydrates affect your blood glucose level more than any other type of food. Your dietitian will help you determine how many carbohydrates to eat at each meal and teach you how to count carbohydrates. Counting carbohydrates is important to keep your blood glucose at a healthy level, especially if you are using insulin or taking certain medicines for diabetes mellitus. Alcohol Alcohol can cause sudden decreases in blood glucose (hypoglycemia), especially if you use insulin or take certain medicines for diabetes mellitus. Hypoglycemia can be a life-threatening condition. Symptoms of hypoglycemia (sleepiness, dizziness, and disorientation) are similar to symptoms of having too much alcohol.  If your health care provider has given you approval to drink alcohol, do so in moderation and use the following guidelines:  Women should not have more than one drink per day, and men  should not have more than two drinks per day. One drink is equal to:  12 oz of beer.  5 oz of wine.  1 oz of hard liquor.  Do not drink on an empty stomach.  Keep yourself hydrated. Have water, diet soda, or unsweetened iced tea.  Regular soda, juice, and other mixers might contain a lot of carbohydrates and should be counted. WHAT FOODS ARE NOT RECOMMENDED? As you make food choices, it is important to remember that all foods are not the same. Some foods have fewer nutrients per serving than other foods, even though they might have the same number of calories or carbohydrates. It is difficult to get your body what it needs when you eat foods with fewer nutrients. Examples of foods that you should avoid that are high in calories and carbohydrates but low in nutrients include:  Trans fats (most processed foods list trans fats on the Nutrition Facts label).  Regular soda.  Juice.  Candy.  Sweets, such as cake, pie, doughnuts, and cookies.  Fried foods. WHAT FOODS CAN I EAT? Eat nutrient-rich foods, which will nourish your body and keep you healthy. The food you should eat also will depend on several factors, including:  The calories you need.  The medicines you take.  Your weight.  Your blood glucose level.  Your blood pressure level.  Your cholesterol level. You should eat a variety of foods, including:  Protein.  Lean cuts of meat.  Proteins low in saturated fats, such as fish, egg whites, and beans. Avoid processed meats.  Fruits and vegetables.  Fruits and vegetables that may help control blood glucose levels, such as apples, mangoes, and   yams.  Dairy products.  Choose fat-free or low-fat dairy products, such as milk, yogurt, and cheese.  Grains, bread, pasta, and rice.  Choose whole grain products, such as multigrain bread, whole oats, and brown rice. These foods may help control blood pressure.  Fats.  Foods containing healthful fats, such as nuts,  avocado, olive oil, canola oil, and fish. DOES EVERYONE WITH DIABETES MELLITUS HAVE THE SAME MEAL PLAN? Because every person with diabetes mellitus is different, there is not one meal plan that works for everyone. It is very important that you meet with a dietitian who will help you create a meal plan that is just right for you.   This information is not intended to replace advice given to you by your health care provider. Make sure you discuss any questions you have with your health care provider.   Document Released: 05/01/2005 Document Revised: 08/25/2014 Document Reviewed: 07/01/2013 Elsevier Interactive Patient Education 2016 Elsevier Inc.  

## 2016-04-24 NOTE — Progress Notes (Signed)
Transitional Care Clinic  Date of telephone encounter: 04/03/16  Hospitalization Dates: 8/14 - 04/02/16   Subjective:    Patient ID: Jared Murray, male    DOB: 05-22-1976, 40 y.o.   MRN: [id number]  HPI Jared Murray is a 40 year old male with a history of uncontrolled type 1 diabetes mellitus (A1c 11.9 from today), noncompliance, multiple hospital admissions for DKA, avascular necrosis of both hips, psoriasis who comes into the clinic for a follow-up visit.  I had referred him to endocrine at his last office visit and it appears referral was sent to [facility] physicians but he is yet to obtain an appointment from them. He informs me his random sugars have been in the 150-300 range however he forgot his blood sugar log with him. Of note he recently received a steroid injection from his dermatologist for treatment of psoriasis.   Compliance with a diabetic diet has been one of his major struggles as he has never been able to set up  meetings with the nutritionist due to his wife's schedule as he would like his wife to be in attendance with him since she makes his meals.   He was seen by orthopedics for his avascular necrosis of the hip and surgery is on hold pending glycemic control.    Past Medical History:  Diagnosis Date  . DKA (diabetic ketoacidoses) ([place])   . Eczema   . GERD (gastroesophageal reflux disease)   . Psoriasis   . Substance abuse   . Type 1 diabetes mellitus ([name])     Past Surgical History:  Procedure Laterality Date  . FINGER SURGERY      Allergies  Allergen Reactions  . Latex Itching and Rash  . Penicillins Other (See Comments)    Childhood allergy Has patient had a PCN reaction causing immediate rash, facial/tongue/throat swelling, SOB or lightheadedness with hypotension: NO Has patient had a PCN reaction causing severe rash involving mucus membranes or skin necrosis:NO Has patient had a PCN reaction that required hospitalization NO Has patient  had a PCN reaction occurring within the last 10 years: NO If all of the above answers are "NO", then may proceed with Cephalosporin use.      Review of Systems   Constitutional: Negative for activity change and appetite change.  HENT: Negative for sinus pressure and sore throat.   Eyes: Negative for visual disturbance.  Respiratory: Negative for cough, chest tightness and shortness of breath.   Cardiovascular: Negative for chest pain and leg swelling.  Gastrointestinal: Negative for abdominal pain, diarrhea, constipation and abdominal distention.  Endocrine: Negative.   Genitourinary: Negative for dysuria.  Musculoskeletal: Negative for myalgias and joint swelling.       Bilateral hip pain  Skin: Positive for rash (Psoriatic lesions on body.), hypopigmented lesions on hands Allergic/Immunologic: Negative.   Neurological: Negative for weakness, light-headedness and numbness.  Psychiatric/Behavioral: Negative for suicidal ideas and dysphoric mood.   Objective: Vitals:   04/24/16 1132  BP: 99/68  Pulse: 98  Temp: 98.4 F (36.9 C)  TempSrc: Oral  SpO2: 99%  Weight: 126 lb (57.2 kg)  Height: 5\' 10"  (1.778 m)      Physical Exam Constitutional: He is oriented to person, place, and time. He appears well-developed and well-nourished.  Cardiovascular: Tachycardic heart rate, normal heart sounds and intact distal pulses.    No murmur heard. Pulmonary/Chest: Effort normal and breath sounds normal. He has no wheezes. He has no rales. He exhibits no tenderness.  Abdominal:  Soft. Bowel sounds are normal. He exhibits no distension and no mass. There is no tenderness.  Musculoskeletal: mild tenderness on ROM of hips  Neurological: He is alert and oriented to person, place, and time.  Skin:  Hypopigmented patches on dorsum of both hands, psoriatic lesions diffusely distributed on body surface.  Psychiatric:  Normal mood       Assessment & Plan:  1. Type 1 diabetes mellitus with  other kidney complication (HCC) Uncontrolled with A1c of 11.9 due to noncompliance and dietary indiscretion. Continue Lantus 20 units at bedtime and continue NovoLog sliding scale Compliance emphasized Provided the number to endocrinology-Dr. Buddy Duty with [name] - POCT A1C - Glucose (CBG) - insulin glargine (LANTUS) 100 UNIT/ML injection; Inject 0.2 mLs (20 Units total) into the skin at bedtime.  Dispense: 10 mL; Refill: 5  2. Avascular necrosis of bone of hip, unspecified laterality ([place]) Seen by orthopedics and surgery will depend on glycemic control.  3. Psoriasis He reports improvement in symptoms after receiving steroid injection.  This note has been created with Surveyor, quantity. Any transcriptional errors are unintentional.

## 2016-07-03 IMAGING — CR DG CHEST 1V PORT
1 series · 1 of 1 positions shown · non-contrast
Comparison: 10/10/2014

CLINICAL DATA: Nausea, vomiting, hyperglycemia.

EXAM:
PORTABLE CHEST - 1 VIEW

[AP]
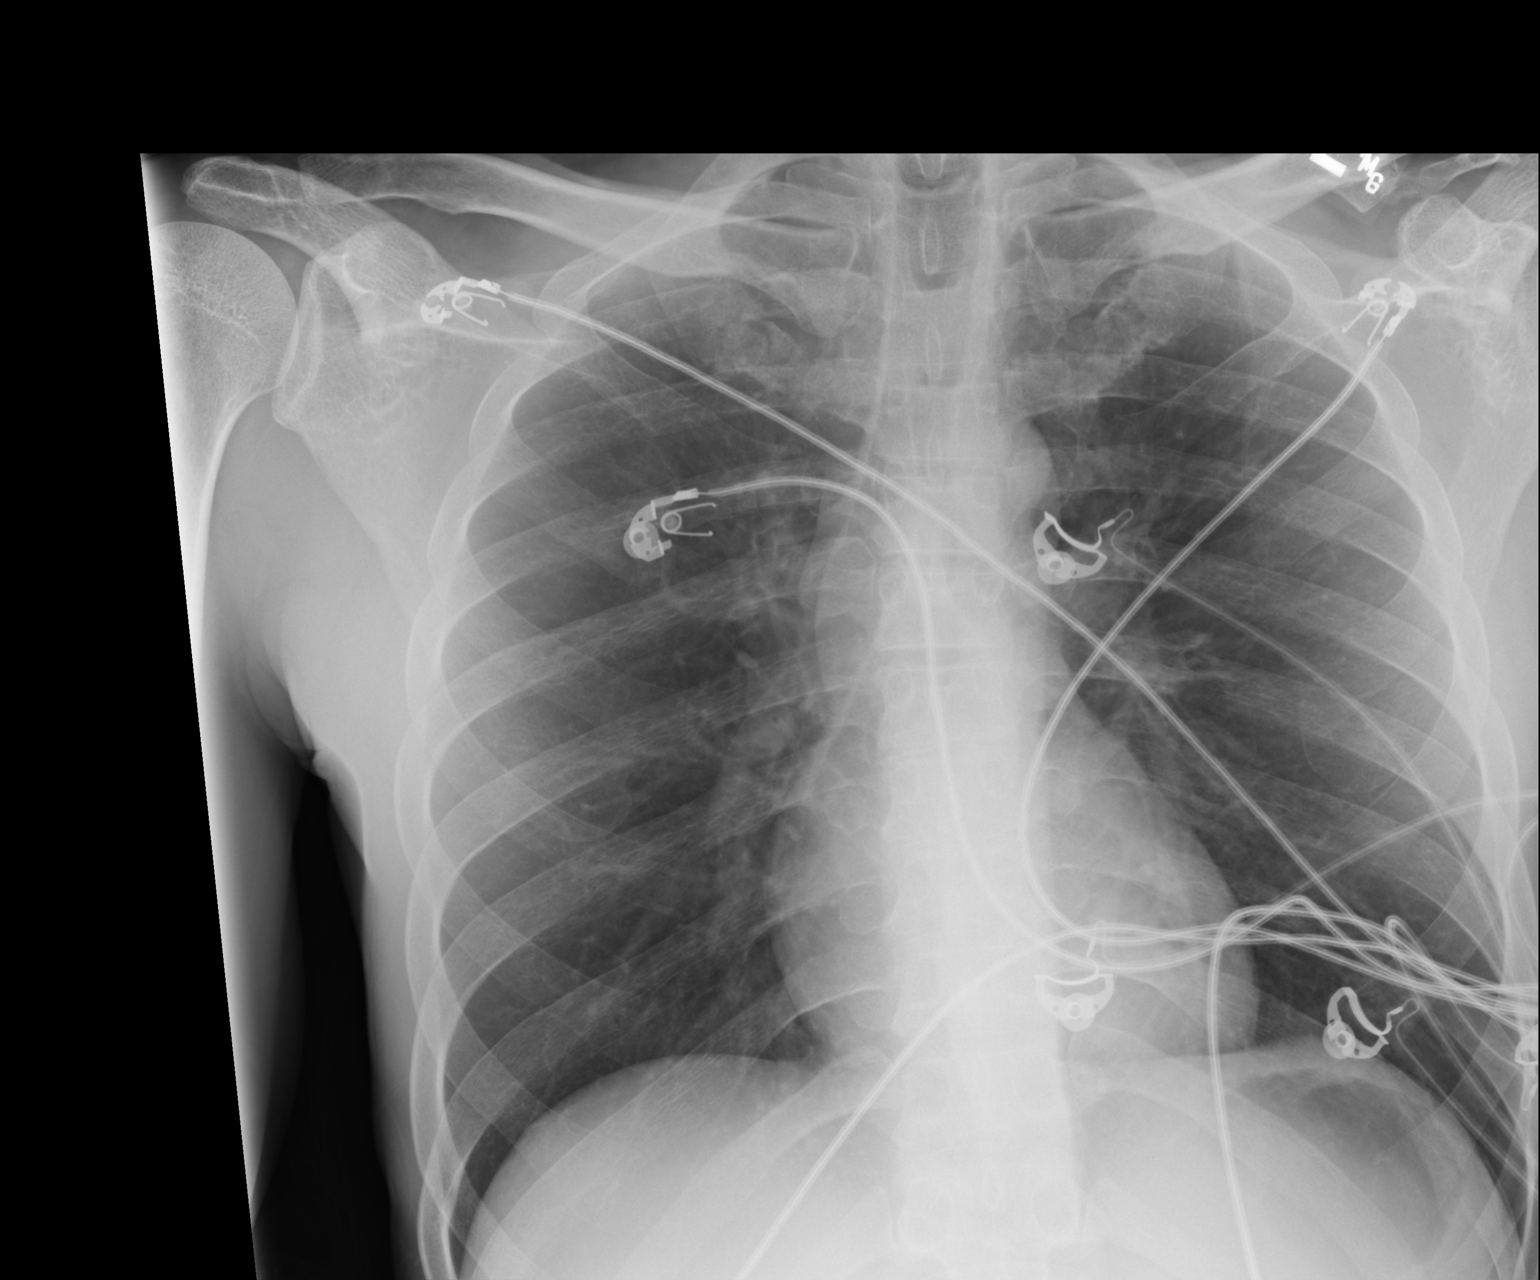

[1 of 1 positions shown; findings below may reference images not displayed]

FINDINGS: The cardiomediastinal contours are normal. The lungs are clear.
Pulmonary vasculature is normal. No consolidation, pleural effusion,
or pneumothorax. No acute osseous abnormalities are seen. Lateral
most lower left ribs excluded from the field of view. Chronic change
about the left shoulder.
IMPRESSION: No acute pulmonary process.

## 2016-07-14 ENCOUNTER — Telehealth: Payer: Self-pay | Admitting: Family Medicine

## 2016-07-14 ENCOUNTER — Other Ambulatory Visit: Payer: Self-pay | Admitting: Family Medicine

## 2016-07-14 DIAGNOSIS — L309 Dermatitis, unspecified: Secondary | ICD-10-CM

## 2016-07-14 DIAGNOSIS — M87059 Idiopathic aseptic necrosis of unspecified femur: Secondary | ICD-10-CM

## 2016-07-14 NOTE — Telephone Encounter (Signed)
Pt. Called requesting a refill on Tylenol # 3. Please f/u °

## 2016-07-15 MED ORDER — ACETAMINOPHEN-CODEINE #3 300-30 MG PO TABS: ORAL_TABLET | ORAL | 1 refills | Status: DC

## 2016-07-15 NOTE — Telephone Encounter (Signed)
Writer called patient, who was not home. Spoke with patient's wife and let her know that patient's prescription was ready for pick up. Patient's wife told to bring ID if coming to pick up prescription.

## 2016-07-15 NOTE — Telephone Encounter (Signed)
Ready for pick up

## 2016-07-29 IMAGING — DX DG CHEST 2V
2 series · 2 of 2 positions shown · non-contrast
Comparison: 01/22/2015

CLINICAL DATA: Chest pain, 2 days duration.

EXAM:
CHEST  2 VIEW

[chest pa]
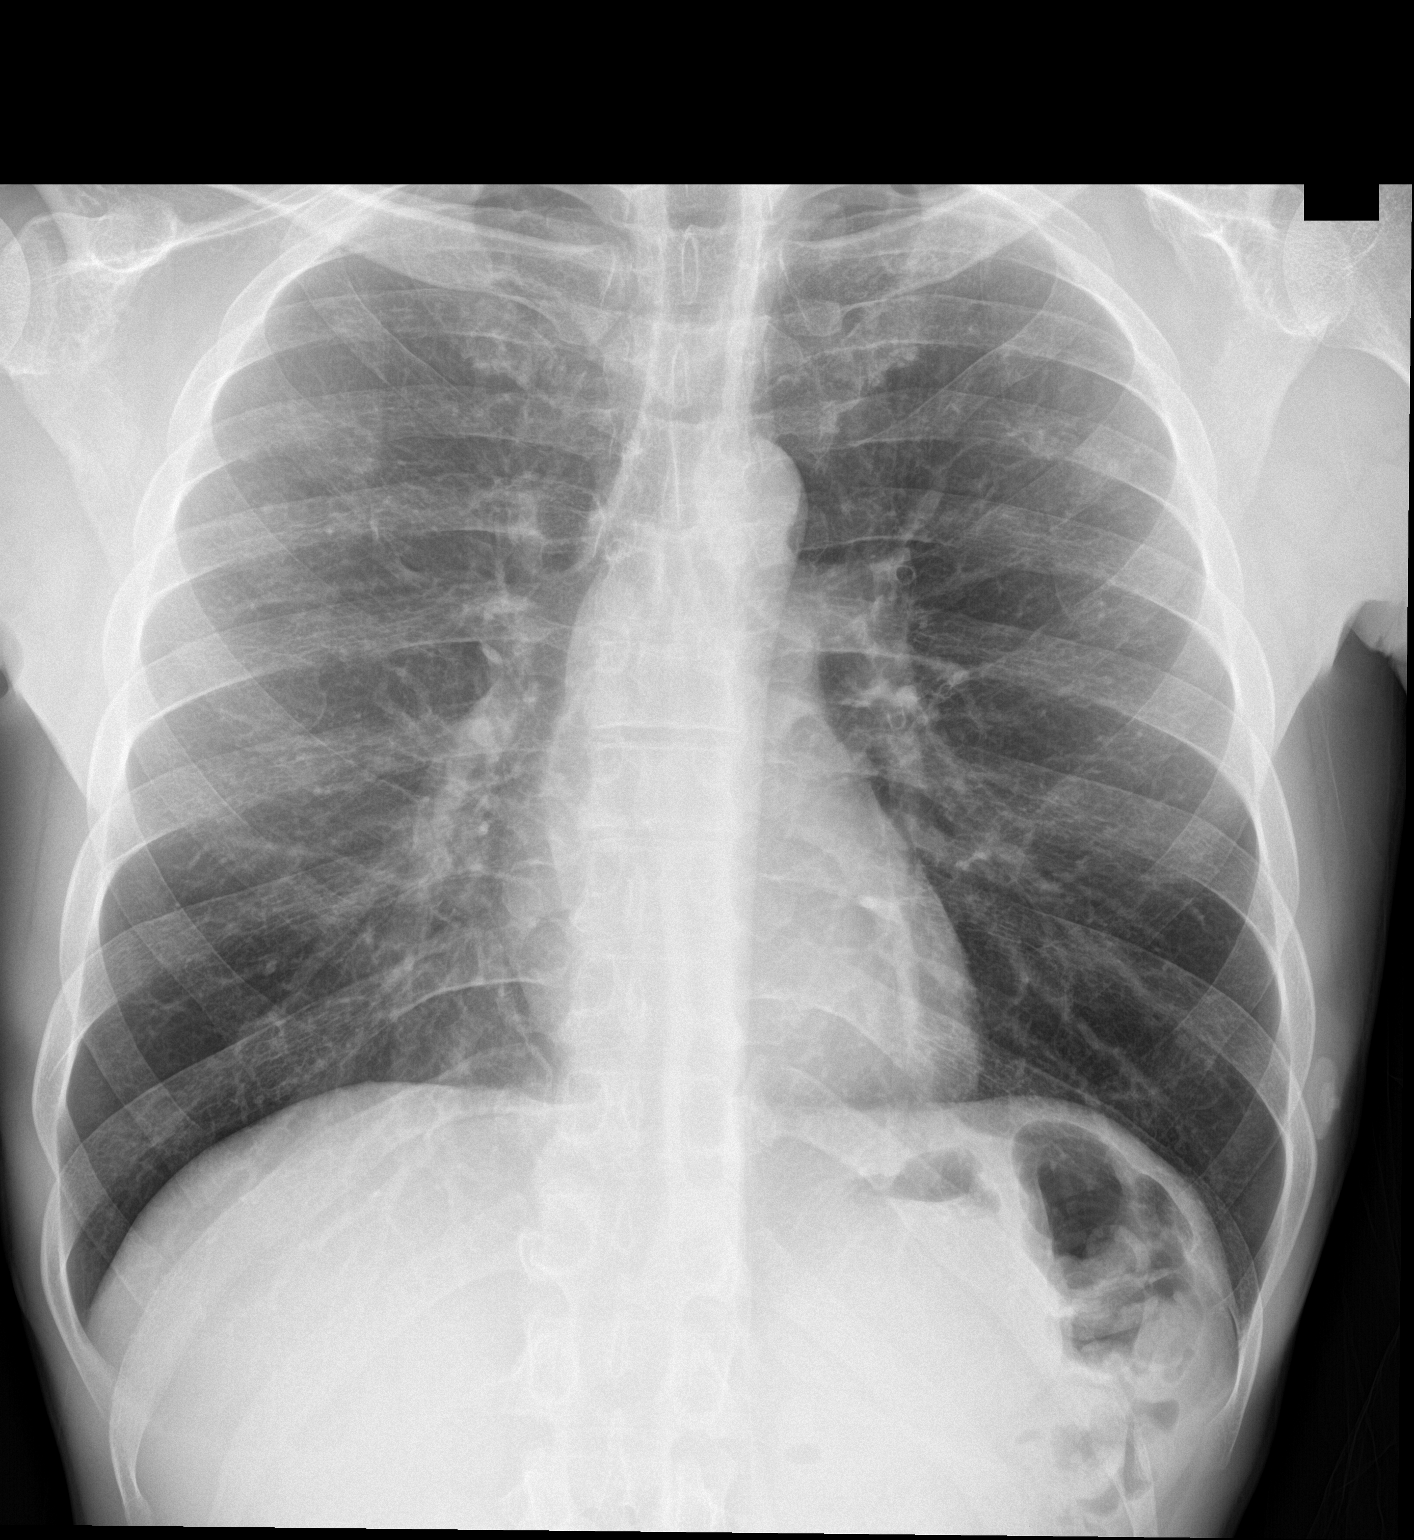

[chest lat]
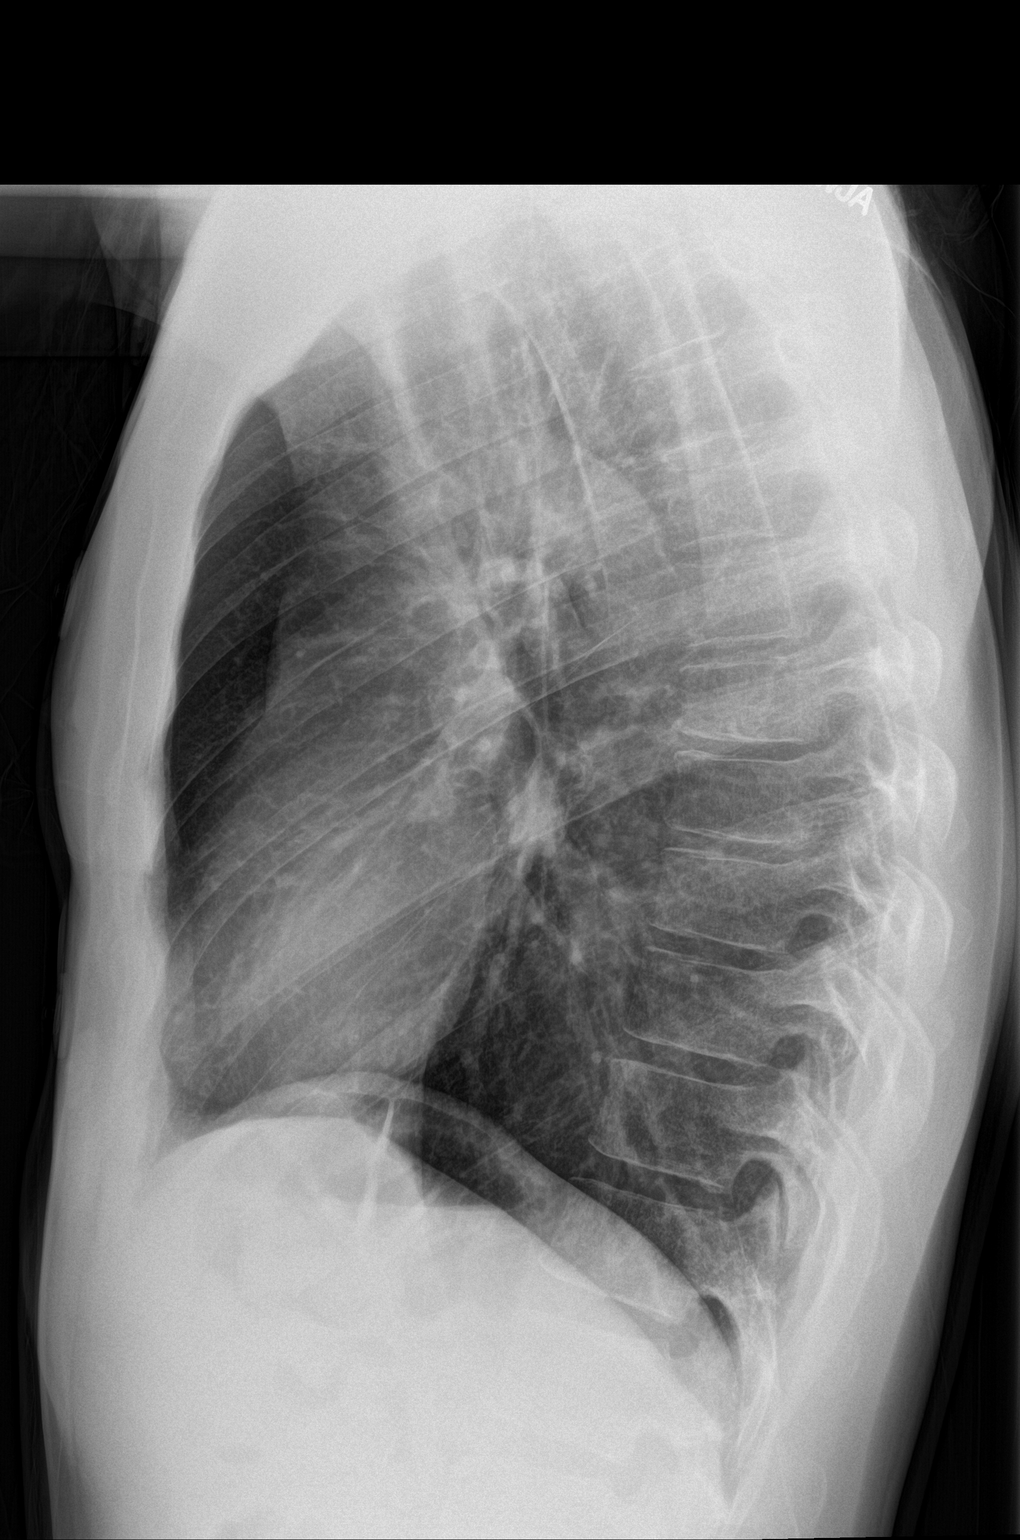

[2 of 2 positions shown; findings below may reference images not displayed]

FINDINGS: The heart size and mediastinal contours are within normal limits.
Both lungs are clear. The visualized skeletal structures are
unremarkable.
IMPRESSION: No active cardiopulmonary disease.

## 2016-08-13 ENCOUNTER — Other Ambulatory Visit: Payer: Self-pay | Admitting: Family Medicine

## 2016-08-13 DIAGNOSIS — M87059 Idiopathic aseptic necrosis of unspecified femur: Secondary | ICD-10-CM

## 2016-08-14 ENCOUNTER — Other Ambulatory Visit: Payer: Self-pay | Admitting: Pharmacist

## 2016-08-14 ENCOUNTER — Telehealth: Payer: Self-pay

## 2016-08-14 DIAGNOSIS — L309 Dermatitis, unspecified: Secondary | ICD-10-CM

## 2016-08-14 MED ORDER — CLOBETASOL PROPIONATE 0.05 % EX CREA: TOPICAL_CREAM | CUTANEOUS | 0 refills | Status: DC

## 2016-08-14 NOTE — Telephone Encounter (Signed)
Writer called patient to inform him that the tylenol #3 prescription is available for pick up.  Patient states that [facility] was confused and he still had a refill left for the tylenol #3 at the pharmacy which he filled today.  Patient is requesting a refill for the clobetasol cream for his eczema. Writer spoke with Erline Levine who will refill the cream and send it to his [facility] pharmacy.  Writer placed the tylenol #3 prescription at the front desk in the medication binder.  Patient is aware and will pick it up when he is running low on the med. Patient requesting a three month f/u appt which he states he has called for several times and schedulers were unable to schedule.  Writer leaving a note with schedulers to set patient up for an appt.

## 2016-08-31 IMAGING — US US ABDOMEN LIMITED
1 series · 14 of 25 positions shown · non-contrast
Comparison: None.

CLINICAL DATA: Bilaterally

EXAM:
US ABDOMEN LIMITED - RIGHT UPPER QUADRANT

[Series 1: us abdomen limited · 0.12mm/px · 14 of 50 slices shown]
[im 1/50]
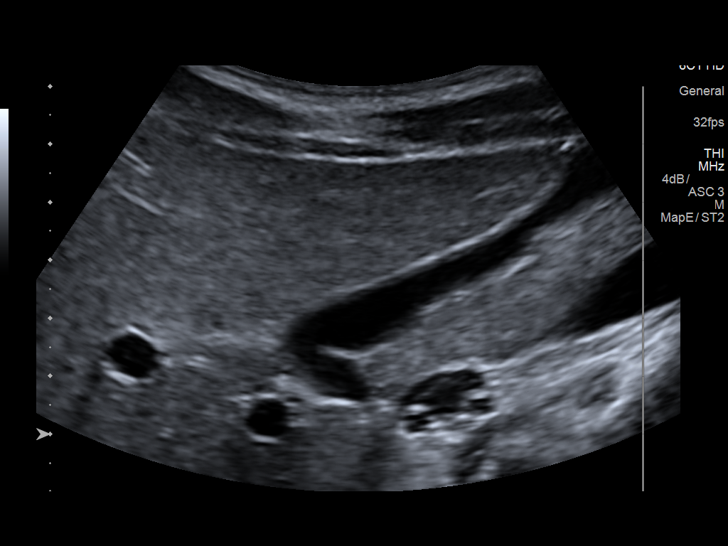
[im 5/50]
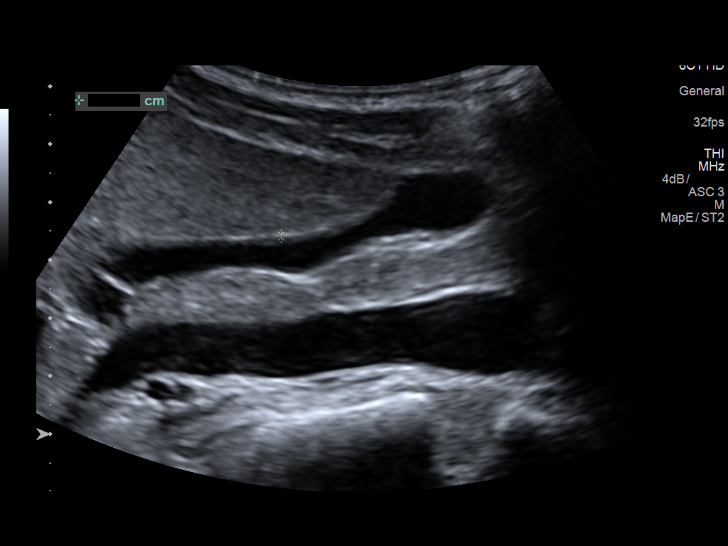
[im 9/50]
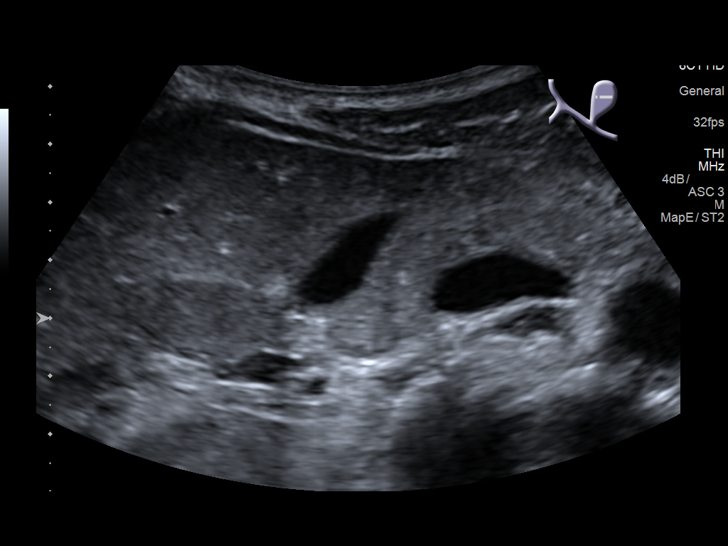
[im 13/50]
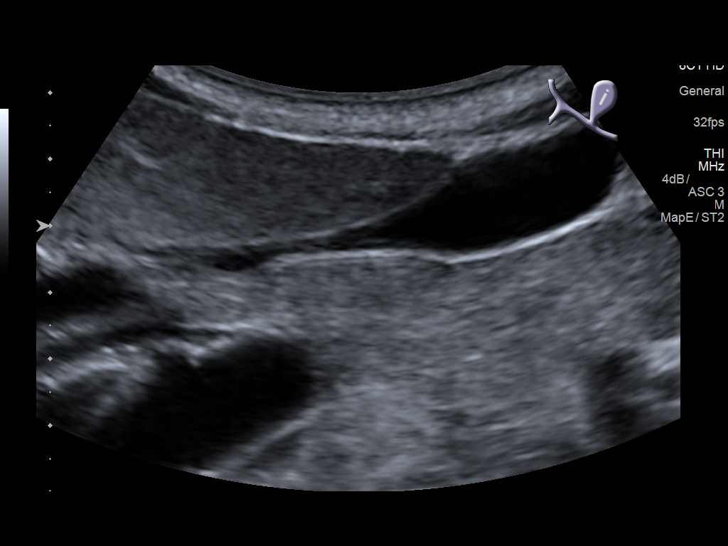
[im 17/50]
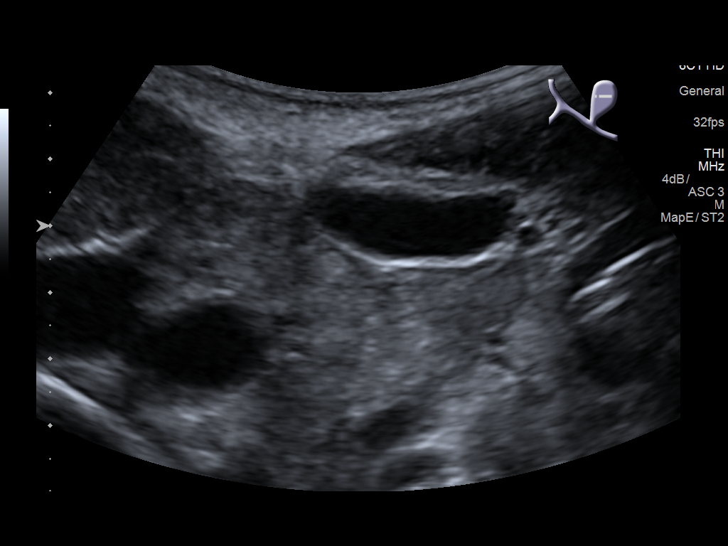
[im 19/50]
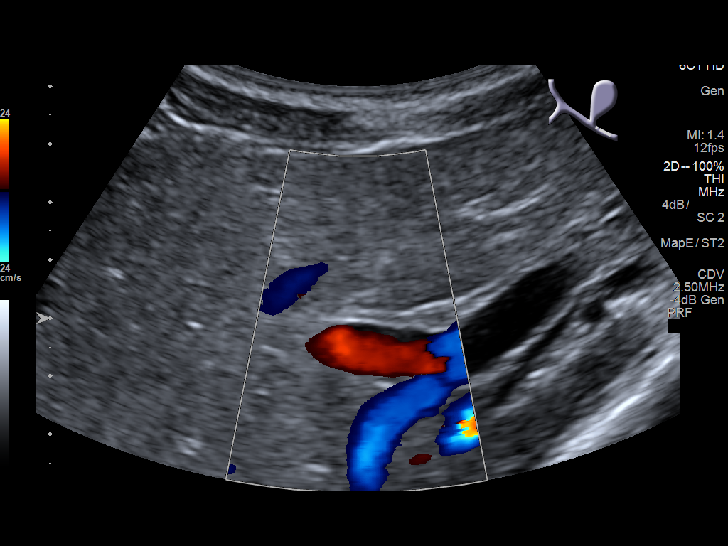
[im 23/50]
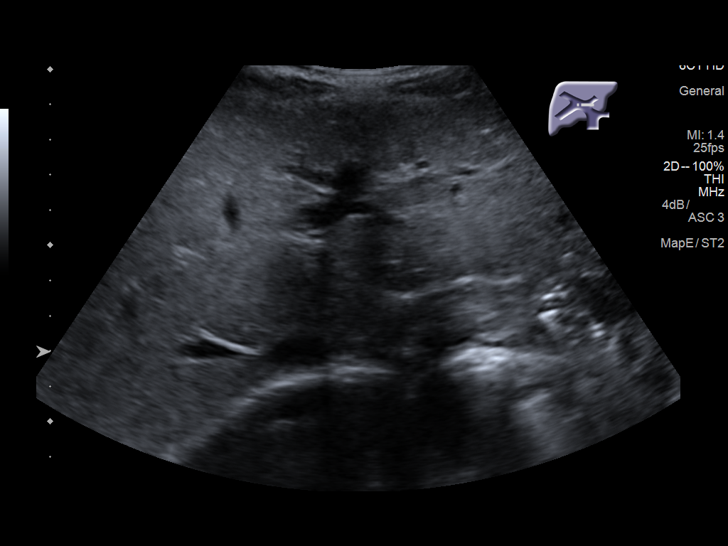
[im 27/50]
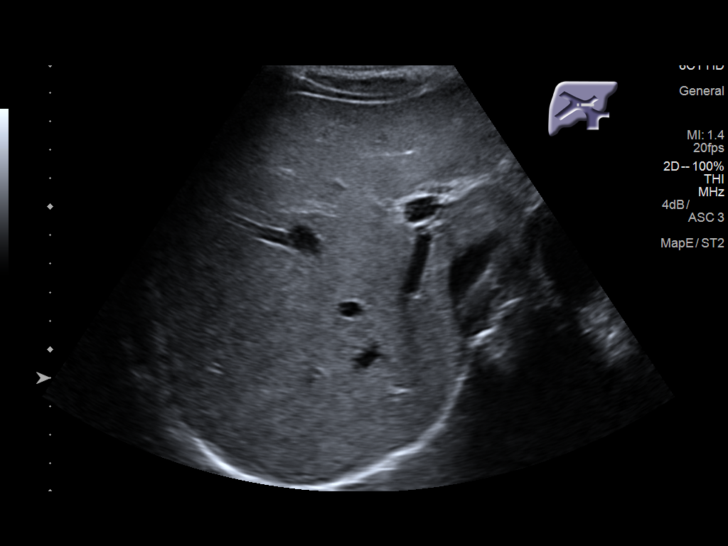
[im 31/50]
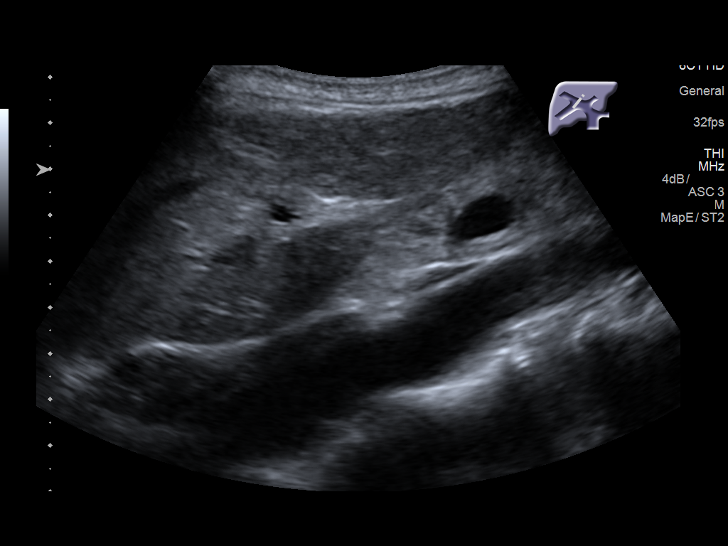
[im 33/50]
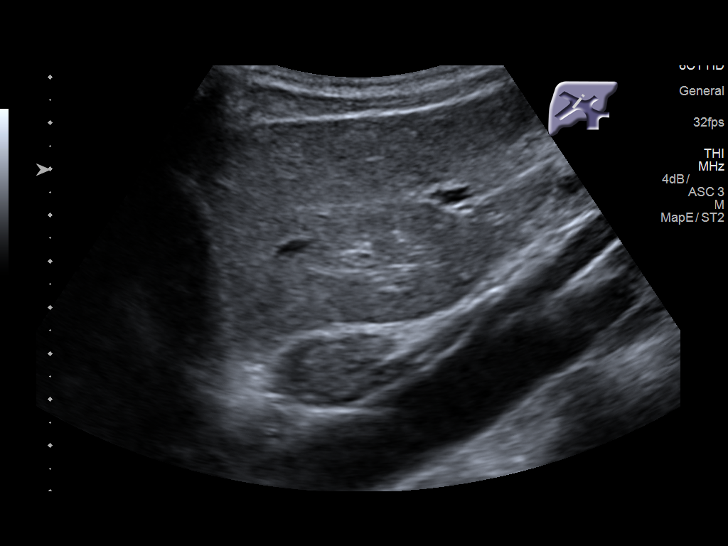
[im 37/50]
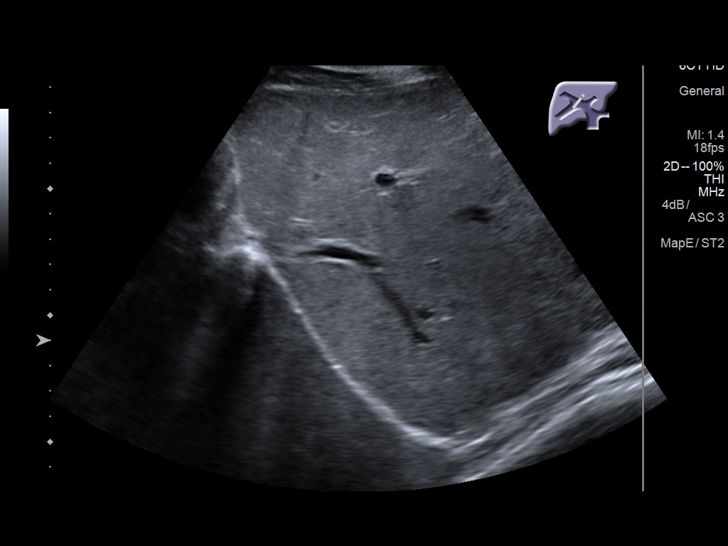
[im 41/50]
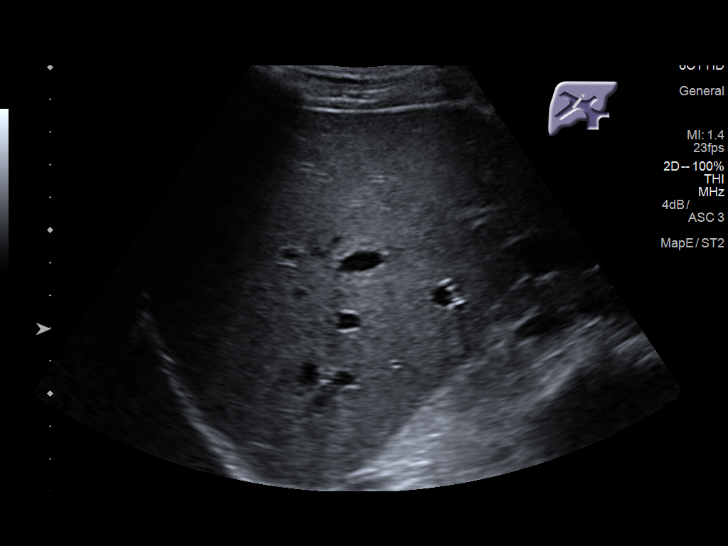
[im 45/50]
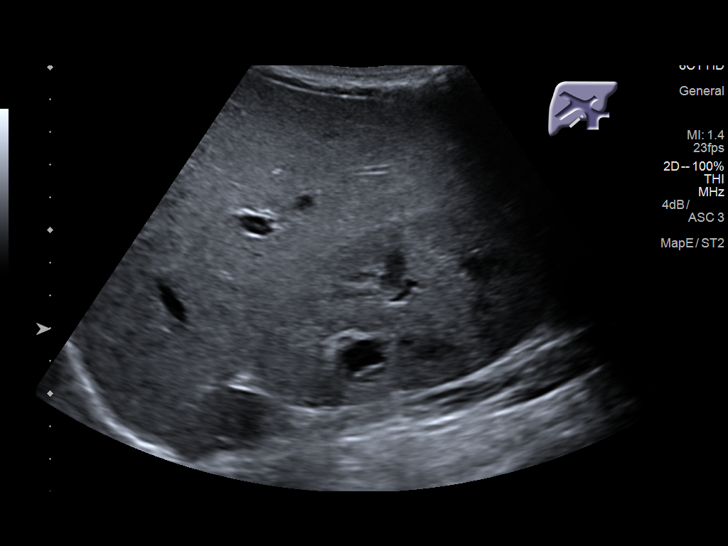
[im 50/50]
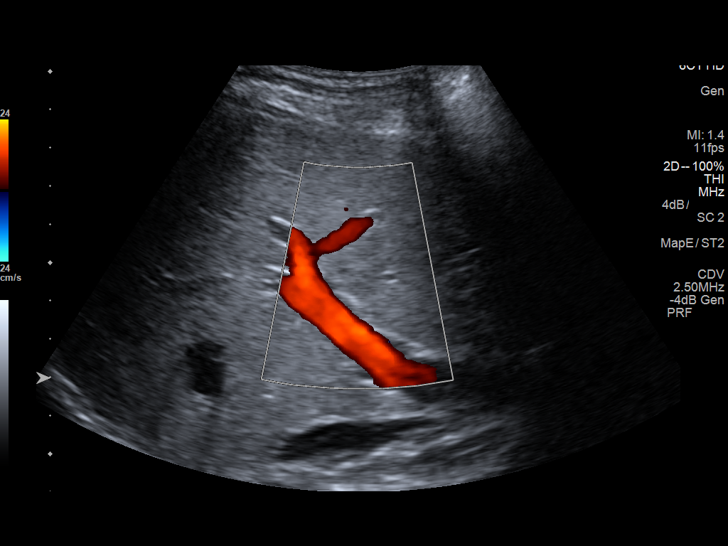

[14 of 25 positions shown; findings below may reference images not displayed]

FINDINGS: Gallbladder:

No gallstones or wall thickening visualized. There is no
pericholecystic fluid. No sonographic Murphy sign noted by
sonographer.

Common bile duct:

Diameter: 1 mm. There is no intrahepatic or extrahepatic biliary
duct dilatation.

Liver:

No focal lesion identified. Within normal limits in parenchymal
echogenicity.
IMPRESSION: Study within normal limits.

## 2016-09-12 IMAGING — CR DG CHEST 1V PORT
1 series · 1 of 1 positions shown · non-contrast
Comparison: 02/17/2015

CLINICAL DATA: Chest pain, abdominal pain, nausea, vomiting.

EXAM:
PORTABLE CHEST - 1 VIEW

[AP]
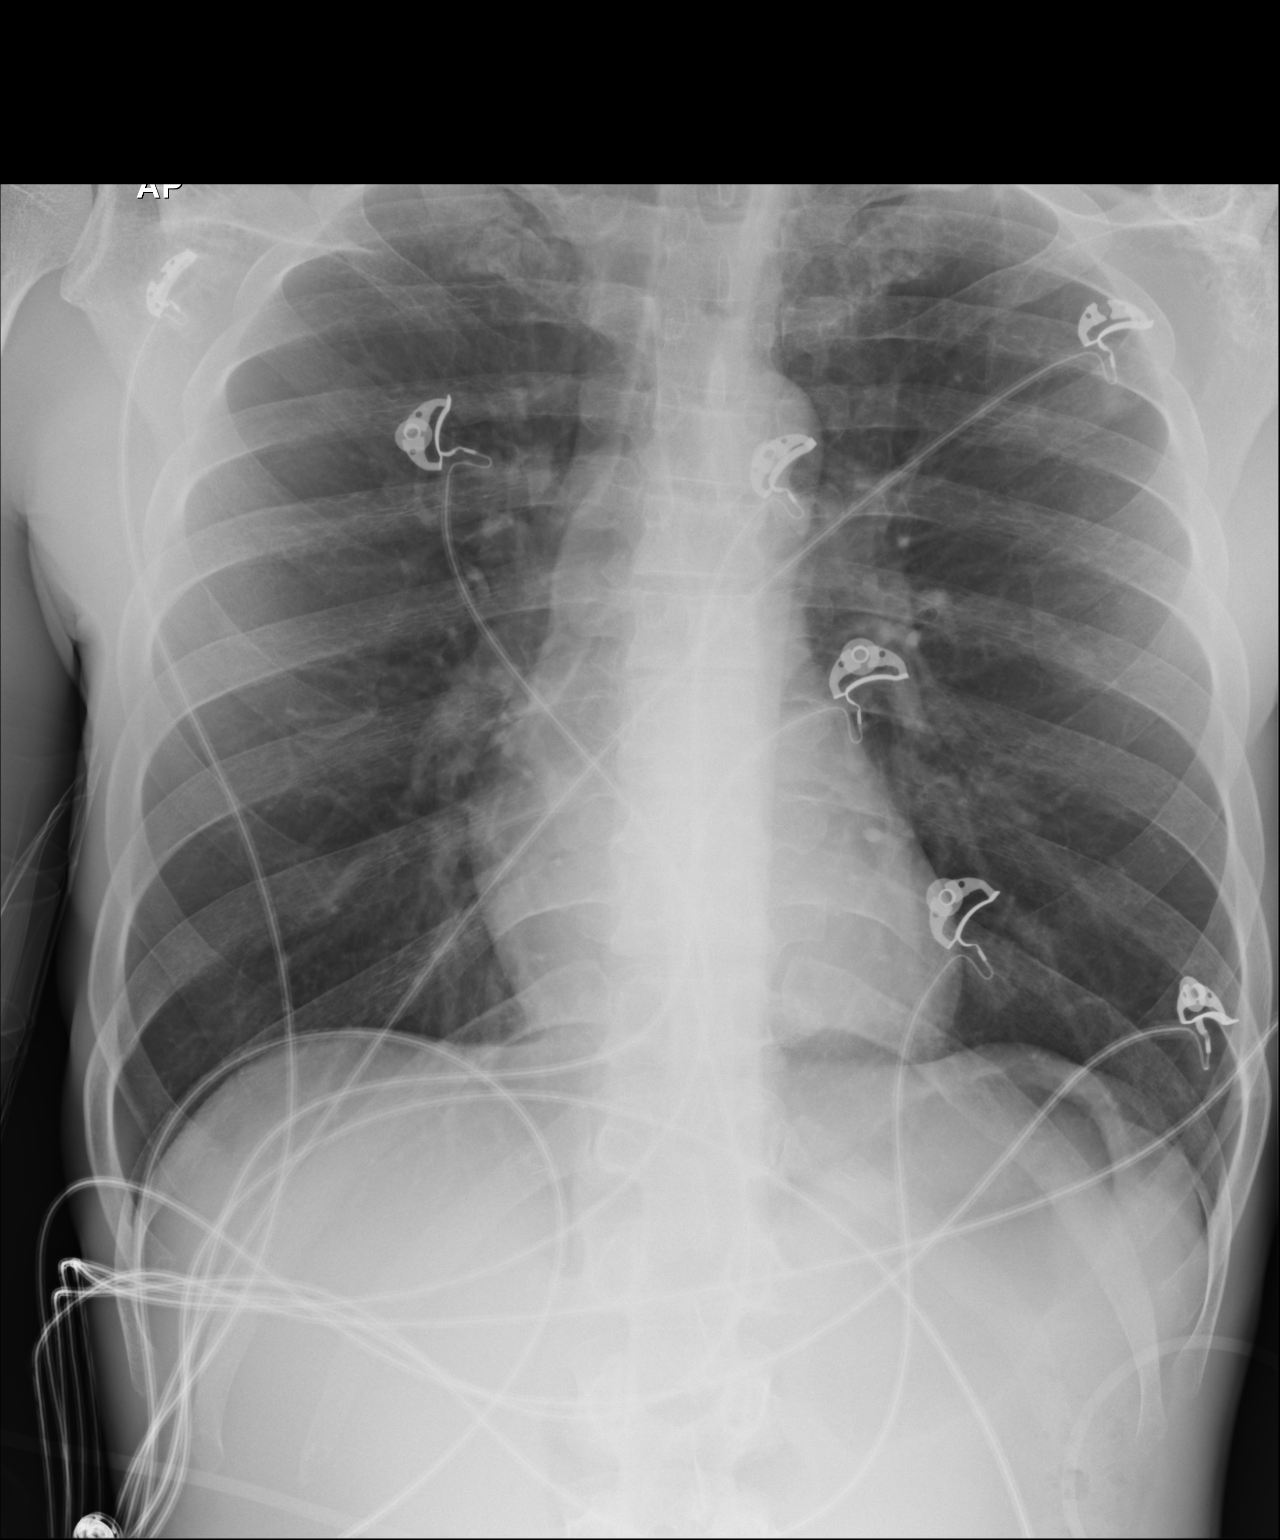

[1 of 1 positions shown; findings below may reference images not displayed]

FINDINGS: The heart size and mediastinal contours are within normal limits.
Both lungs are clear. The visualized skeletal structures are
unremarkable.
IMPRESSION: No active disease.

## 2016-09-18 ENCOUNTER — Ambulatory Visit: Payer: Medicaid Other | Attending: Family Medicine | Admitting: Family Medicine

## 2016-09-18 ENCOUNTER — Encounter: Payer: Self-pay | Admitting: Licensed Clinical Social Worker

## 2016-09-18 ENCOUNTER — Encounter: Payer: Self-pay | Admitting: Family Medicine

## 2016-09-18 VITALS — BP 95/65 | HR 96 | Temp 98.4°F | Ht 70.0 in | Wt 125.4 lb

## 2016-09-18 DIAGNOSIS — E1065 Type 1 diabetes mellitus with hyperglycemia: Secondary | ICD-10-CM

## 2016-09-18 DIAGNOSIS — K219 Gastro-esophageal reflux disease without esophagitis: Secondary | ICD-10-CM | POA: Diagnosis not present

## 2016-09-18 DIAGNOSIS — Z9114 Patient's other noncompliance with medication regimen: Secondary | ICD-10-CM | POA: Diagnosis not present

## 2016-09-18 DIAGNOSIS — E1029 Type 1 diabetes mellitus with other diabetic kidney complication: Secondary | ICD-10-CM

## 2016-09-18 DIAGNOSIS — M87059 Idiopathic aseptic necrosis of unspecified femur: Secondary | ICD-10-CM

## 2016-09-18 DIAGNOSIS — G4700 Insomnia, unspecified: Secondary | ICD-10-CM | POA: Diagnosis not present

## 2016-09-18 DIAGNOSIS — F320 Major depressive disorder, single episode, mild: Secondary | ICD-10-CM | POA: Diagnosis not present

## 2016-09-18 DIAGNOSIS — Z794 Long term (current) use of insulin: Secondary | ICD-10-CM | POA: Diagnosis not present

## 2016-09-18 DIAGNOSIS — K529 Noninfective gastroenteritis and colitis, unspecified: Secondary | ICD-10-CM

## 2016-09-18 DIAGNOSIS — M879 Osteonecrosis, unspecified: Secondary | ICD-10-CM | POA: Insufficient documentation

## 2016-09-18 DIAGNOSIS — L309 Dermatitis, unspecified: Secondary | ICD-10-CM

## 2016-09-18 DIAGNOSIS — R110 Nausea: Secondary | ICD-10-CM

## 2016-09-18 DIAGNOSIS — G4709 Other insomnia: Secondary | ICD-10-CM

## 2016-09-18 LAB — BASIC METABOLIC PANEL
BUN: 12 mg/dL (ref 7–25)
CO2: 31 mmol/L (ref 20–31)
Calcium: 9.6 mg/dL (ref 8.6–10.3)
Chloride: 96 mmol/L — ABNORMAL LOW (ref 98–110)
Creat: 0.92 mg/dL (ref 0.60–1.35)
Glucose, Bld: 249 mg/dL — ABNORMAL HIGH (ref 65–99)
Potassium: 4.2 mmol/L (ref 3.5–5.3)
Sodium: 137 mmol/L (ref 135–146)

## 2016-09-18 LAB — POCT GLYCOSYLATED HEMOGLOBIN (HGB A1C): Hemoglobin A1C: 11.4

## 2016-09-18 LAB — GLUCOSE, POCT (MANUAL RESULT ENTRY): POC GLUCOSE: 205 mg/dL — AB (ref 70–99)

## 2016-09-18 MED ORDER — INSULIN GLARGINE 100 UNIT/ML ~~LOC~~ SOLN: 15.0000 [IU] | Freq: Two times a day (BID) | SUBCUTANEOUS | 5 refills | Status: DC

## 2016-09-18 MED ORDER — PANTOPRAZOLE SODIUM 40 MG PO TBEC: 40.0000 mg | DELAYED_RELEASE_TABLET | Freq: Every day | ORAL | 5 refills | Status: DC

## 2016-09-18 MED ORDER — ONDANSETRON HCL 4 MG/2ML IJ SOLN
4.0000 mg | Freq: Once | INTRAMUSCULAR | Status: AC
  Administered 2016-09-18: 4 mg via INTRAMUSCULAR

## 2016-09-18 MED ORDER — GABAPENTIN 300 MG PO CAPS: 300.0000 mg | ORAL_CAPSULE | Freq: Two times a day (BID) | ORAL | 3 refills | Status: DC

## 2016-09-18 MED ORDER — NOVOLOG 100 UNIT/ML ~~LOC~~ SOLN: 0.0000 [IU] | Freq: Three times a day (TID) | SUBCUTANEOUS | 6 refills | Status: DC

## 2016-09-18 MED ORDER — ACETAMINOPHEN-CODEINE #3 300-30 MG PO TABS: ORAL_TABLET | ORAL | 1 refills | Status: DC

## 2016-09-18 MED ORDER — CLOBETASOL PROPIONATE 0.05 % EX CREA: TOPICAL_CREAM | CUTANEOUS | 1 refills | Status: DC

## 2016-09-18 MED ORDER — TRAZODONE HCL 100 MG PO TABS: 100.0000 mg | ORAL_TABLET | Freq: Every evening | ORAL | 3 refills | Status: DC | PRN

## 2016-09-18 MED ORDER — PROMETHAZINE HCL 25 MG PO TABS: 25.0000 mg | ORAL_TABLET | Freq: Three times a day (TID) | ORAL | 0 refills | Status: DC | PRN

## 2016-09-18 NOTE — Progress Notes (Signed)
Separated from wife over the holidays- thinks the N/V might be emotional Doesn't feel the tylenol #3 are helping with hip pain Refills on clobetasol cream and pantoprazole Want to see [facility]

## 2016-09-18 NOTE — Progress Notes (Signed)
Subjective:  Patient ID: Jared Murray, male    DOB: 1976/04/13  Age: 41 y.o. MRN: [id number]  CC: Diabetes; Nausea; Emesis (5-6 times over the past 2 days.); referrals (ortho- hips); Insomnia; and Depression   HPI Jared Murray is a 41 year old male with a history of uncontrolled type 1 diabetes mellitus (A1c 11.4 from today), noncompliance, multiple hospital admissions for DKA, avascular necrosis of both hips, psoriasis who comes into the clinic for a follow-up visit.  He has not been compliant with a diabetic diet but informs me that his fasting sugars have been controlled but denies hypoglycemia; he has resisted increase in Lantus doses in the past due to fear of hypoglycemia.Does not have blood sugar log with him. He is currently depressed as he states he has separated from his wife and is living with his brother who does not cook a diabetic diet.  He would like to be referred to orthopedic due to pain from his bilateral avascular necrosis of the hips. He complains of nausea, vomiting and diarrhea for the last 36 hours and states his family also have similar symptoms. Denies fevers and has not used any antiemetic.  Past Medical History:  Diagnosis Date  . DKA (diabetic ketoacidoses) ([place])   . Eczema   . GERD (gastroesophageal reflux disease)   . Psoriasis   . Substance abuse   . Type 1 diabetes mellitus ([place])     Past Surgical History:  Procedure Laterality Date  . FINGER SURGERY      Allergies  Allergen Reactions  . Latex Itching and Rash  . Penicillins Other (See Comments)    Childhood allergy Has patient had a PCN reaction causing immediate rash, facial/tongue/throat swelling, SOB or lightheadedness with hypotension: NO Has patient had a PCN reaction causing severe rash involving mucus membranes or skin necrosis:NO Has patient had a PCN reaction that required hospitalization NO Has patient had a PCN reaction occurring within the last 10 years: NO If all of the  above answers are "NO", then may proceed with Cephalosporin use.     Outpatient Medications Prior to Visit  Medication Sig Dispense Refill  . glucose blood (ACCU-CHEK AVIVA PLUS) test strip USE TO CHECK BLOOD SUGAR FOUR TIMES DAILY BEFORE MEALS AND AT BEDTIME 120 each 5  . hydrocortisone 2.5 % cream Apply 1 application topically 2 (two) times daily as needed.     . Insulin Syringe-Needle U-100 (B-D INS SYRINGE 2CC/29GX1/2") 29G X 1/2" 2 ML MISC Check Blood sugar TID and QHS 100 each 12  . Lancet Devices (ACCU-CHEK SOFTCLIX) lancets Use as instructed three times daily. 1 each 5  . acetaminophen-codeine (TYLENOL #3) 300-30 MG tablet TAKE 1 TABLET BY MOUTH EVERY 8 HOURS AS NEEDED FOR MODERATE PAIN 60 tablet 0  . clobetasol cream (TEMOVATE) 0.05 % APPLY EXTERNALLY TO THE AFFECTED AREA TWICE DAILY 30 g 0  . insulin glargine (LANTUS) 100 UNIT/ML injection Inject 0.2 mLs (20 Units total) into the skin at bedtime. 10 mL 5  . NOVOLOG 100 UNIT/ML injection Inject 0-12 Units into the skin 3 (three) times daily with meals. As per sliding scale 2 vial 6  . pantoprazole (PROTONIX) 40 MG tablet Take 1 tablet (40 mg total) by mouth daily. (Patient not taking: Reported on 09/18/2016) 30 tablet 2   No facility-administered medications prior to visit.     ROS Review of Systems Constitutional: Negative for activity change and appetite change.  HENT: Negative for sinus pressure and sore throat.  Eyes: Negative for visual disturbance.  Respiratory: Negative for cough, chest tightness and shortness of breath.   Cardiovascular: Negative for chest pain and leg swelling.  Gastrointestinal: see hpi  Endocrine: Negative.   Genitourinary: Negative for dysuria.  Musculoskeletal: Negative for myalgias and joint swelling.       Bilateral hip pain  Skin: Positive for rash (Psoriatic lesions on body.), hypopigmented lesions on hands Allergic/Immunologic: Negative.   Neurological: Negative for weakness,  light-headedness and numbness.  Psychiatric/Behavioral: Negative for suicidal ideas and positive for dysphoric mood.   Objective:  BP 95/65 (BP Location: Right Arm, Patient Position: Sitting, Cuff Size: Large)   Pulse 96   Temp 98.4 F (36.9 C) (Oral)   Ht 5\' 10"  (1.778 m)   Wt 125 lb 6.4 oz (56.9 kg)   SpO2 100%   BMI 17.99 kg/m   BP/Weight 09/18/2016 04/24/2016 [id number]  Systolic BP 95 99 93  Diastolic BP 65 68 63  Wt. (Lbs) 125.4 126 127.8  BMI 17.99 18.08 18.34      Physical Exam Constitutional: He is oriented to person, place, and time. He appears well-developed and well-nourished.  Cardiovascular: Tachycardic heart rate, normal heart sounds and intact distal pulses.    No murmur heard. Pulmonary/Chest: Effort normal and breath sounds normal. He has no wheezes. He has no rales. He exhibits no tenderness.  Abdominal: Soft. Bowel sounds are normal. He exhibits no distension and no mass. There is no tenderness.  Musculoskeletal: mild tenderness on ROM of hips  Neurological: He is alert and oriented to person, place, and time.  Skin:  Hypopigmented patches on dorsum of both hands, psoriatic lesions diffusely distributed on body surface.  Psychiatric:  Dysphoric mood  Lab Results  Component Value Date   HGBA1C 11.4 09/18/2016    Assessment & Plan:   1. Type 1 diabetes mellitus with hyperglycemia (HCC) Uncontrolled with A1c of 11.4 Due to noncompliance Change the Lantus regimen from 20 units daily at bedtime to 15 units 2 times daily as he states fasting sugars are under 120 daily - Glucose (CBG) - HgB A1c - NOVOLOG 100 UNIT/ML injection; Inject 0-12 Units into the skin 3 (three) times daily with meals. As per sliding scale  Dispense: 2 vial; Refill: 6 - insulin glargine (LANTUS) 100 UNIT/ML injection; Inject 0.15 mLs (15 Units total) into the skin 2 (two) times daily.  Dispense: 30 mL; Refill: 5  2. Gastroenteritis Likely viral - Basic Metabolic Panel -  promethazine (PHENERGAN) 25 MG tablet; Take 1 tablet (25 mg total) by mouth every 8 (eight) hours as needed for nausea or vomiting.  Dispense: 20 tablet; Refill: 0  3. Avascular necrosis of bone of hip, unspecified laterality (HCC) - acetaminophen-codeine (TYLENOL #3) 300-30 MG tablet; TAKE 1 TABLET BY MOUTH EVERY 8 HOURS AS NEEDED FOR MODERATE PAIN  Dispense: 60 tablet; Refill: 1 - Ambulatory referral to Orthopedic Surgery  4. Eczema, unspecified type - clobetasol cream (TEMOVATE) 0.05 %; APPLY EXTERNALLY TO THE AFFECTED AREA TWICE DAILY  Dispense: 30 g; Refill: 1  5. 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 Depression, major, single episode, mild ([place]) He refuses antidepressant LCSW called in for counseling with the patient  7. Other insomnia - traZODone (DESYREL) 100 MG tablet; Take 1 tablet (100 mg total) by mouth at bedtime as needed for sleep.  Dispense: 30 tablet; Refill: 3   Meds ordered this encounter  Medications  . acetaminophen-codeine (TYLENOL #3) 300-30 MG tablet    Sig:  TAKE 1 TABLET BY MOUTH EVERY 8 HOURS AS NEEDED FOR MODERATE PAIN    Dispense:  60 tablet    Refill:  1  . clobetasol cream (TEMOVATE) 0.05 %    Sig: APPLY EXTERNALLY TO THE AFFECTED AREA TWICE DAILY    Dispense:  30 g    Refill:  1  . NOVOLOG 100 UNIT/ML injection    Sig: Inject 0-12 Units into the skin 3 (three) times daily with meals. As per sliding scale    Dispense:  2 vial    Refill:  6    Please dispense as vial. Thanks  . pantoprazole (PROTONIX) 40 MG tablet    Sig: Take 1 tablet (40 mg total) by mouth daily.    Dispense:  30 tablet    Refill:  5  . insulin glargine (LANTUS) 100 UNIT/ML injection    Sig: Inject 0.15 mLs (15 Units total) into the skin 2 (two) times daily.    Dispense:  30 mL    Refill:  5    Discontinue previous dose  . promethazine (PHENERGAN) 25 MG tablet    Sig: Take 1 tablet (25 mg total) by mouth every 8 (eight) hours as needed for nausea  or vomiting.    Dispense:  20 tablet    Refill:  0  . gabapentin (NEURONTIN) 300 MG capsule    Sig: Take 1 capsule (300 mg total) by mouth 2 (two) times daily.    Dispense:  60 capsule    Refill:  3  . traZODone (DESYREL) 100 MG tablet    Sig: Take 1 tablet (100 mg total) by mouth at bedtime as needed for sleep.    Dispense:  30 tablet    Refill:  3    Follow-up: Return in about 1 month (around 10/16/2016) for Complete physical exam.   [name] MD

## 2016-09-18 NOTE — BH Specialist Note (Signed)
Session Start time: 11:15 AM   End Time: 11:55 AM Total Time:  35 minutes Type of Service: [place]: No.   Interpreter Name & Language: N/A # [facility] Visits July 2017-June 2018: 1st   SUBJECTIVE: Jared Murray is a 41 y.o. male  Pt. was referred by Dr. Jarold Song for:  anxiety and depression. Pt. reports the following symptoms/concerns: overwhelming feelings of sadness and worry, hip pain, low energy, difficulty sleeping, racing thoughts, withdrawn behavior, and irritabiliity Duration of problem:  "A while" Severity: severe Previous treatment: Pt has no hx of receiving behavioral health services   OBJECTIVE: Mood: Anxious and Dysphoric & Affect: Depressed and Tearful Risk of harm to self or others: Pt denied SI/HI Assessments administered: PHQ-9; GAD-7  LIFE CONTEXT:  Family & Social: Pt is married and has two adult children. Pt has been residing with his older brother for 2 months due to strain in his marriage School/ Work: Pt is unemployed. He receives Physicist, medical and has filed for disability twice. Pt currently has a Chief Executive Officer and is awaiting hearing date for appeal Self-Care: Pt reports drinking alcohol "occasionally" and smoking cigarettes (approx 10 daily) Life changes: Pt is experiencing conflict in marriage resulting in him staying with brother What is important to pt/family (values): Family, Good Health, Independence   GOALS ADDRESSED:  Decrease symptoms of depression Decrease symptoms of anxiety  INTERVENTIONS: Solution Focused, Strength-based and Supportive   ASSESSMENT:  Pt currently experiencing depression and anxiety triggered by hip pain and ongoing conflict in marriage. Pt reports overwhelming feelings of sadness and worry, hip pain, low energy, difficulty sleeping, racing thoughts, withdrawn behavior, and irritability. Pt is currently receiving emotional support from adult siblings. Pt may benefit from psychoeducation,  psychotherapy, and medication management. [place] educated pt on the cycle of depression and anxiety and discussed how substance use can negatively impact pt's mental and physical health. Pt and LCSWA discussed realistic healthy coping skills that can assist in a decrease symptoms, in addition, to importance of medication compliance to effectively manage diabetes. Pt is open to initiating behavorial health services and was provided community resources for crisis intervention and therapy. Pt verbalized no interest in medication management.       PLAN: 1. F/U with behavioral health clinician: Pt was encouraged to contact [place] if symptoms worsen or fail to improve to schedule behavioral appointments at [facility]. 2. Behavioral Health meds: None reported 3. Behavioral recommendations: LCSWA recommends that pt apply healthy coping skills discussed and initiate behavioral health services. Pt is encouraged to schedule follow up appointment with LCSWA 4. Referral: Brief Counseling/Psychotherapy, [employer], Problem-solving teaching/coping strategies, Psychoeducation and Supportive Counseling 5. From scale of 1-10, how likely are you to follow plan: 7/10   Rebekah Chesterfield, MSW, [place] Worker 09/19/16 10:30 AM  Warmhandoff:   Warm Hand Off Completed.

## 2016-10-10 ENCOUNTER — Telehealth: Payer: Self-pay | Admitting: Family Medicine

## 2016-10-10 NOTE — Telephone Encounter (Signed)
Pt. Came into facility requesting for a new Rx for Tylenol # 3. Pt. States that on 09/18/16 he was given the Rx and he lost it.  Please f/u with pt.

## 2016-10-23 IMAGING — CR DG CHEST 1V PORT
1 series · 1 of 1 positions shown · non-contrast
Comparison: 04/03/2015

CLINICAL DATA: Shortness of breath.

EXAM:
PORTABLE CHEST 1 VIEW

[AP]
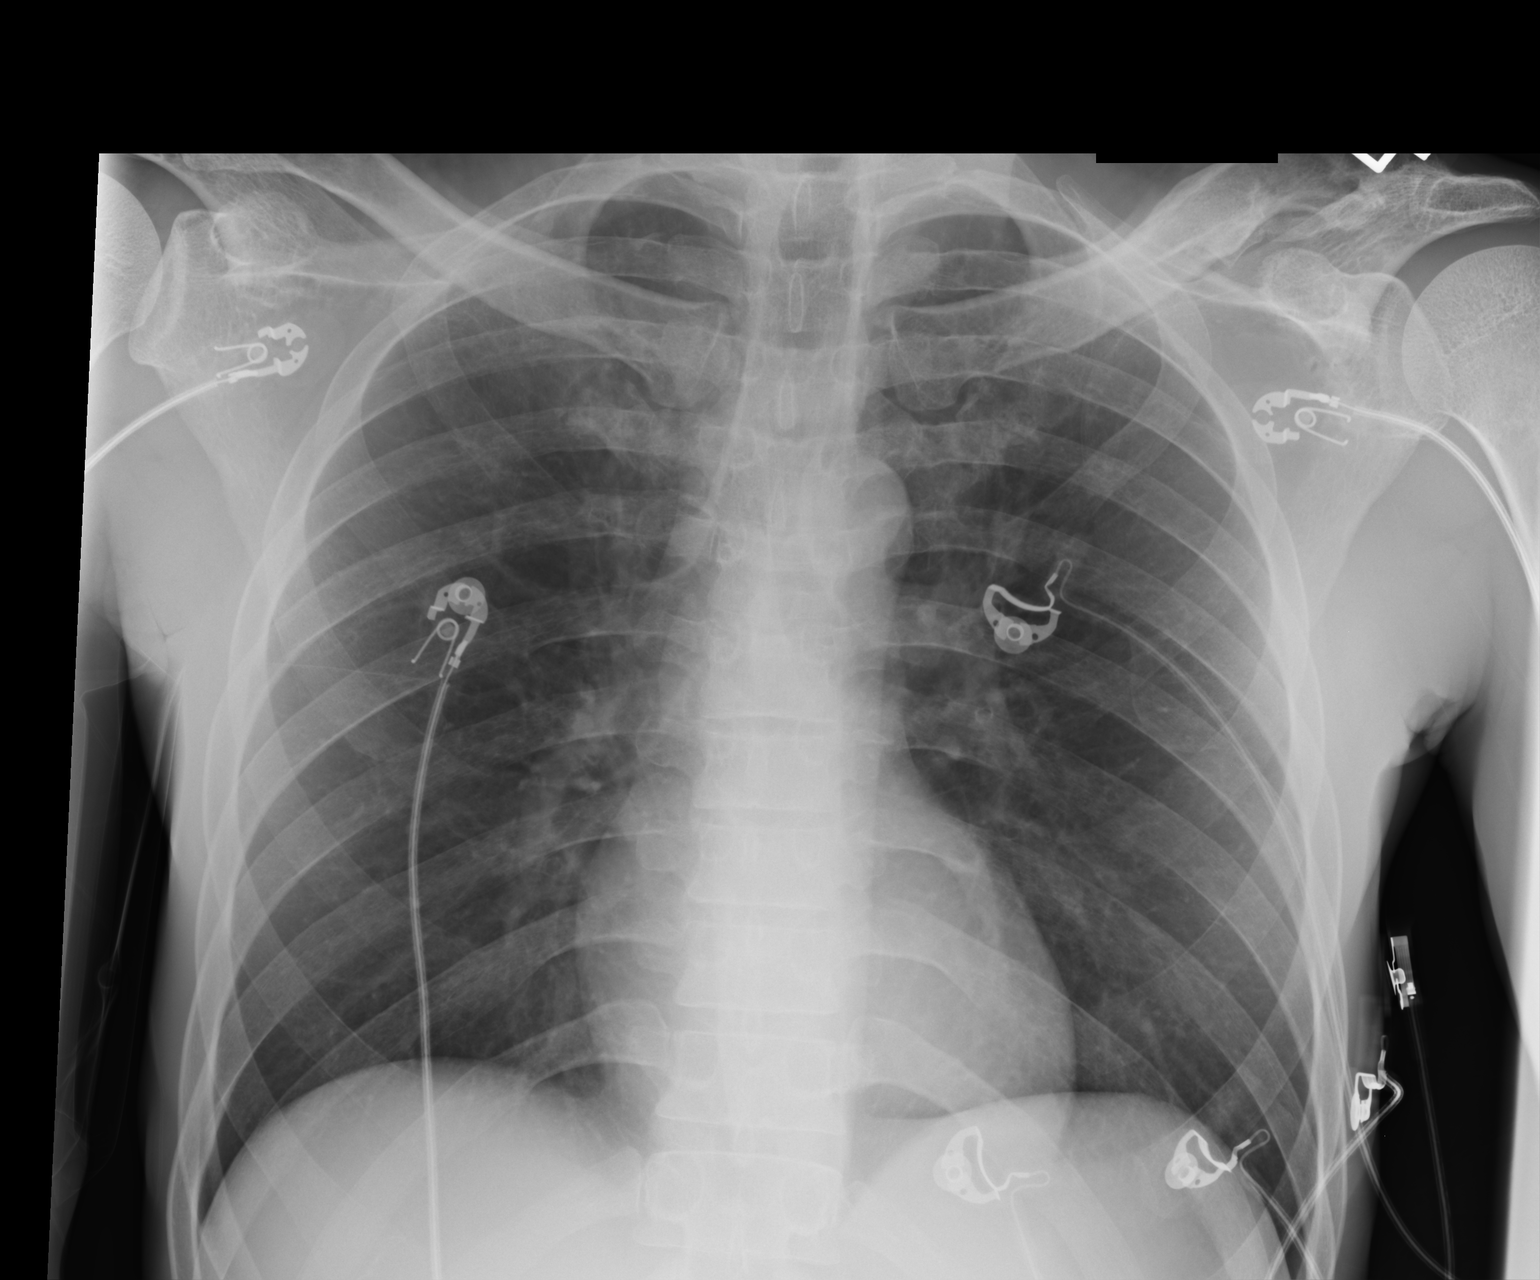

[1 of 1 positions shown; findings below may reference images not displayed]

FINDINGS: Heart size and pulmonary vascularity normal and the lungs are clear.
No acute osseous abnormality. Old posttraumatic changes of the
distal left clavicle.
IMPRESSION: No acute abnormalities.

## 2016-11-11 ENCOUNTER — Ambulatory Visit: Payer: Medicaid Other | Attending: Family Medicine | Admitting: Family Medicine

## 2016-11-11 ENCOUNTER — Encounter: Payer: Self-pay | Admitting: Family Medicine

## 2016-11-11 VITALS — BP 114/78 | HR 94 | Temp 98.3°F | Ht 70.0 in | Wt 127.8 lb

## 2016-11-11 DIAGNOSIS — N528 Other male erectile dysfunction: Secondary | ICD-10-CM

## 2016-11-11 DIAGNOSIS — K219 Gastro-esophageal reflux disease without esophagitis: Secondary | ICD-10-CM | POA: Diagnosis not present

## 2016-11-11 DIAGNOSIS — K529 Noninfective gastroenteritis and colitis, unspecified: Secondary | ICD-10-CM

## 2016-11-11 DIAGNOSIS — M879 Osteonecrosis, unspecified: Secondary | ICD-10-CM | POA: Diagnosis not present

## 2016-11-11 DIAGNOSIS — M87059 Idiopathic aseptic necrosis of unspecified femur: Secondary | ICD-10-CM | POA: Diagnosis not present

## 2016-11-11 DIAGNOSIS — Z794 Long term (current) use of insulin: Secondary | ICD-10-CM | POA: Insufficient documentation

## 2016-11-11 DIAGNOSIS — E1065 Type 1 diabetes mellitus with hyperglycemia: Secondary | ICD-10-CM | POA: Insufficient documentation

## 2016-11-11 DIAGNOSIS — Z9119 Patient's noncompliance with other medical treatment and regimen: Secondary | ICD-10-CM | POA: Diagnosis not present

## 2016-11-11 DIAGNOSIS — L409 Psoriasis, unspecified: Secondary | ICD-10-CM | POA: Insufficient documentation

## 2016-11-11 DIAGNOSIS — N529 Male erectile dysfunction, unspecified: Secondary | ICD-10-CM | POA: Diagnosis not present

## 2016-11-11 DIAGNOSIS — L309 Dermatitis, unspecified: Secondary | ICD-10-CM

## 2016-11-11 LAB — GLUCOSE, POCT (MANUAL RESULT ENTRY): POC GLUCOSE: 224 mg/dL — AB (ref 70–99)

## 2016-11-11 MED ORDER — GABAPENTIN 300 MG PO CAPS: 300.0000 mg | ORAL_CAPSULE | Freq: Two times a day (BID) | ORAL | 3 refills | Status: DC

## 2016-11-11 MED ORDER — ACETAMINOPHEN-CODEINE #3 300-30 MG PO TABS: ORAL_TABLET | ORAL | 1 refills | Status: DC

## 2016-11-11 MED ORDER — CLOBETASOL PROPIONATE 0.05 % EX CREA: TOPICAL_CREAM | CUTANEOUS | 1 refills | Status: DC

## 2016-11-11 MED ORDER — PANTOPRAZOLE SODIUM 40 MG PO TBEC: 40.0000 mg | DELAYED_RELEASE_TABLET | Freq: Every day | ORAL | 5 refills | Status: DC

## 2016-11-11 MED ORDER — INSULIN GLARGINE 100 UNIT/ML ~~LOC~~ SOLN: 22.0000 [IU] | Freq: Two times a day (BID) | SUBCUTANEOUS | 5 refills | Status: DC

## 2016-11-11 MED ORDER — METOCLOPRAMIDE HCL 10 MG PO TABS: 10.0000 mg | ORAL_TABLET | Freq: Three times a day (TID) | ORAL | 1 refills | Status: DC | PRN

## 2016-11-11 MED ORDER — SILDENAFIL CITRATE 50 MG PO TABS: 50.0000 mg | ORAL_TABLET | Freq: Every day | ORAL | 0 refills | Status: DC | PRN

## 2016-11-11 NOTE — Patient Instructions (Signed)
Carbohydrate Counting for Diabetes Mellitus, Adult Carbohydrate counting is a method for keeping track of how many carbohydrates you eat. Eating carbohydrates naturally increases the amount of sugar (glucose) in the blood. Counting how many carbohydrates you eat helps keep your blood glucose within normal limits, which helps you manage your diabetes (diabetes mellitus). It is important to know how many carbohydrates you can safely have in each meal. This is different for every person. A diet and nutrition specialist (registered dietitian) can help you make a meal plan and calculate how many carbohydrates you should have at each meal and snack. Carbohydrates are found in the following foods:  Grains, such as breads and cereals.  Dried beans and soy products.  Starchy vegetables, such as potatoes, peas, and corn.  Fruit and fruit juices.  Milk and yogurt.  Sweets and snack foods, such as cake, cookies, candy, chips, and soft drinks. How do I count carbohydrates? There are two ways to count carbohydrates in food. You can use either of the methods or a combination of both. Reading "Nutrition Facts" on packaged food  The "Nutrition Facts" list is included on the labels of almost all packaged foods and beverages in the [place] It includes:  The serving size.  Information about nutrients in each serving, including the grams (g) of carbohydrate per serving. To use the "Nutrition Facts":  Decide how many servings you will have.  Multiply the number of servings by the number of carbohydrates per serving.  The resulting number is the total amount of carbohydrates that you will be having. Learning standard serving sizes of other foods  When you eat foods containing carbohydrates that are not packaged or do not include "Nutrition Facts" on the label, you need to measure the servings in order to count the amount of carbohydrates:  Measure the foods that you will eat with a food scale or measuring  cup, if needed.  Decide how many standard-size servings you will eat.  Multiply the number of servings by 15. Most carbohydrate-rich foods have about 15 g of carbohydrates per serving.  For example, if you eat 8 oz (170 g) of strawberries, you will have eaten 2 servings and 30 g of carbohydrates (2 servings x 15 g = 30 g).  For foods that have more than one food mixed, such as soups and casseroles, you must count the carbohydrates in each food that is included. The following list contains standard serving sizes of common carbohydrate-rich foods. Each of these servings has about 15 g of carbohydrates:   hamburger bun or  English muffin.   oz (15 mL) syrup.   oz (14 g) jelly.  1 slice of bread.  1 six-inch tortilla.  3 oz (85 g) cooked rice or pasta.  4 oz (113 g) cooked dried beans.  4 oz (113 g) starchy vegetable, such as peas, corn, or potatoes.  4 oz (113 g) hot cereal.  4 oz (113 g) mashed potatoes or  of a large baked potato.  4 oz (113 g) canned or frozen fruit.  4 oz (120 mL) fruit juice.  4-6 crackers.  6 chicken nuggets.  6 oz (170 g) unsweetened dry cereal.  6 oz (170 g) plain fat-free yogurt or yogurt sweetened with artificial sweeteners.  8 oz (240 mL) milk.  8 oz (170 g) fresh fruit or one small piece of fruit.  24 oz (680 g) popped popcorn. Example of carbohydrate counting Sample meal  3 oz (85 g) chicken breast.  6 oz (  170 g) brown rice.  4 oz (113 g) corn.  8 oz (240 mL) milk.  8 oz (170 g) strawberries with sugar-free whipped topping. Carbohydrate calculation 1. Identify the foods that contain carbohydrates:  Rice.  Corn.  Milk.  Strawberries. 2. Calculate how many servings you have of each food:  2 servings rice.  1 serving corn.  1 serving milk.  1 serving strawberries. 3. Multiply each number of servings by 15 g:  2 servings rice x 15 g = 30 g.  1 serving corn x 15 g = 15 g.  1 serving milk x 15 g = 15  g.  1 serving strawberries x 15 g = 15 g. 4. Add together all of the amounts to find the total grams of carbohydrates eaten:  30 g + 15 g + 15 g + 15 g = 75 g of carbohydrates total. This information is not intended to replace advice given to you by your health care provider. Make sure you discuss any questions you have with your health care provider. Document Released: 08/04/2005 Document Revised: 02/22/2016 Document Reviewed: 01/16/2016 Elsevier Interactive Patient Education  2017 Elsevier Inc.  

## 2016-11-11 NOTE — Progress Notes (Signed)
Subjective:  Patient ID: Jared Murray, male    DOB: [birth year]  Age: 41 y.o. MRN: [id number]  CC: Diabetes; Anxiety; GI referral; and Erectile Dysfunction   HPI Jared Murray is a 41 year old male with a history of uncontrolled type 1 diabetes mellitus (A1c 11.4 from today), noncompliance, multiple hospital admissions for DKA, avascular necrosis of both hips, psoriasis who comes into the clinic for a follow-up visit.  He recently had an episode of gastroenteritis for which he was seen at the ED in [facility] where he received IV fluids and antiemetics. His mom is requesting a referral to gastroenterologist due to the symptoms. On further questioning patient endorses occasional nausea with bad smells,some early satiety but this is not common. Denies abdominal pain, reflux, diarrhea or vomiting. Occasionally feels bloated.  He was recently seen by orthopedics for his bilateral avascular necrosis of the hip and total hip replacement put on hold pending control of his diabetes. He complains of erectile dysfunction which he would like treated.  With regards to his diabetes mellitus he has been noncompliant with a diabetic diet, maintains dietary indiscretion.  Past Medical History:  Diagnosis Date  . DKA (diabetic ketoacidoses) ([place])   . Eczema   . GERD (gastroesophageal reflux disease)   . Psoriasis   . Substance abuse   . Type 1 diabetes mellitus ([place])     Past Surgical History:  Procedure Laterality Date  . FINGER SURGERY      Allergies  Allergen Reactions  . Latex Itching and Rash  . Penicillins Other (See Comments)    Childhood allergy Has patient had a PCN reaction causing immediate rash, facial/tongue/throat swelling, SOB or lightheadedness with hypotension: NO Has patient had a PCN reaction causing severe rash involving mucus membranes or skin necrosis:NO Has patient had a PCN reaction that required hospitalization NO Has patient had a PCN reaction occurring  within the last 10 years: NO If all of the above answers are "NO", then may proceed with Cephalosporin use.     Outpatient Medications Prior to Visit  Medication Sig Dispense Refill  . glucose blood (ACCU-CHEK AVIVA PLUS) test strip USE TO CHECK BLOOD SUGAR FOUR TIMES DAILY BEFORE MEALS AND AT BEDTIME 120 each 5  . hydrocortisone 2.5 % cream Apply 1 application topically 2 (two) times daily as needed.     . Insulin Syringe-Needle U-100 (B-D INS SYRINGE 2CC/29GX1/2") 29G X 1/2" 2 ML MISC Check Blood sugar TID and QHS 100 each 12  . Lancet Devices (ACCU-CHEK SOFTCLIX) lancets Use as instructed three times daily. 1 each 5  . NOVOLOG 100 UNIT/ML injection Inject 0-12 Units into the skin 3 (three) times daily with meals. As per sliding scale 2 vial 6  . traZODone (DESYREL) 100 MG tablet Take 1 tablet (100 mg total) by mouth at bedtime as needed for sleep. 30 tablet 3  . acetaminophen-codeine (TYLENOL #3) 300-30 MG tablet TAKE 1 TABLET BY MOUTH EVERY 8 HOURS AS NEEDED FOR MODERATE PAIN 60 tablet 1  . clobetasol cream (TEMOVATE) 0.05 % APPLY EXTERNALLY TO THE AFFECTED AREA TWICE DAILY 30 g 1  . gabapentin (NEURONTIN) 300 MG capsule Take 1 capsule (300 mg total) by mouth 2 (two) times daily. 60 capsule 3  . insulin glargine (LANTUS) 100 UNIT/ML injection Inject 0.15 mLs (15 Units total) into the skin 2 (two) times daily. 30 mL 5  . pantoprazole (PROTONIX) 40 MG tablet Take 1 tablet (40 mg total) by mouth daily. 30 tablet  5  . promethazine (PHENERGAN) 25 MG tablet Take 1 tablet (25 mg total) by mouth every 8 (eight) hours as needed for nausea or vomiting. 20 tablet 0   No facility-administered medications prior to visit.     ROS Review of Systems Constitutional: Negative for activity change and appetite change.  HENT: Negative for sinus pressure and sore throat.   Eyes: Negative for visual disturbance.  Respiratory: Negative for cough, chest tightness and shortness of breath.   Cardiovascular:  Negative for chest pain and leg swelling.  Gastrointestinal: see hpi  Endocrine: Negative.   Genitourinary: Negative for dysuria.  Musculoskeletal: Negative for myalgias and joint swelling.       Bilateral hip pain  Skin: Positive for rash (Psoriatic lesions on body.), hypopigmented lesions on hands Allergic/Immunologic: Negative.   Neurological: Negative for weakness, light-headedness and numbness.  Psychiatric/Behavioral: Negative for suicidal ideas and positive for dysphoric mood.  Objective:  BP 114/78 (BP Location: Right Arm, Patient Position: Sitting, Cuff Size: Small)   Pulse 94   Temp 98.3 F (36.8 C) (Oral)   Ht 5\' 10"  (1.778 m)   Wt 127 lb 12.8 oz (58 kg)   SpO2 100%   BMI 18.34 kg/m   BP/Weight 11/11/2016 0/03/6577 11/22/9627  Systolic BP 528 95 99  Diastolic BP 78 65 68  Wt. (Lbs) 127.8 125.4 126  BMI 18.34 17.99 18.08      Physical Exam Constitutional: He is oriented to person, place, and time. He appears well-developed and well-nourished.  Cardiovascular: Tachycardic heart rate, normal heart sounds and intact distal pulses.    No murmur heard. Pulmonary/Chest: Effort normal and breath sounds normal. He has no wheezes. He has no rales. He exhibits no tenderness.  Abdominal: Soft. Bowel sounds are normal. He exhibits no distension and no mass. There is no tenderness.  Musculoskeletal: mild tenderness on ROM of hips  Neurological: He is alert and oriented to person, place, and time.  Skin:  Hypopigmented patches on dorsum of both hands, psoriatic lesions diffusely distributed on body surface.  Psychiatric:  Normal mood  Lab Results  Component Value Date   HGBA1C 11.4 09/18/2016     Assessment & Plan:  1. Psoriasis Continue topical steroid  2. Avascular necrosis of bone of hip, unspecified laterality ([place]) Hip replacement pending control of diabetes mellitus - acetaminophen-codeine (TYLENOL #3) 300-30 MG tablet; TAKE 1 TABLET BY MOUTH EVERY 8 HOURS AS  NEEDED FOR MODERATE PAIN  Dispense: 60 tablet; Refill: 1  3. Type 1 diabetes mellitus with hyperglycemia (HCC) Uncontrolled with A1c of 11.4 Largely due to noncompliance Increased dose of Lantus to 20 units twice daily and he has been advised to increase to 22 units in the event of hyperglycemia Continue NovoLog sliding scale Reglan added for possible gastroparesis Review blood sugar log at next visit Compliance with diabetic diet has been emphasized - Glucose (CBG) - insulin glargine (LANTUS) 100 UNIT/ML injection; Inject 0.22 mLs (22 Units total) into the skin 2 (two) times daily.  Dispense: 30 mL; Refill: 5  4. Gastroenteritis Resolved - metoCLOPramide (REGLAN) 10 MG tablet; Take 1 tablet (10 mg total) by mouth every 8 (eight) hours as needed for nausea.  Dispense: 60 tablet; Refill: 1  5. Psoriasis - clobetasol cream (TEMOVATE) 0.05 %; APPLY EXTERNALLY TO THE AFFECTED AREA TWICE DAILY  Dispense: 30 g; Refill: 1  6. Other male erectile dysfunction Placed on Viagra.   Meds ordered this encounter  Medications  . clobetasol cream (TEMOVATE) 0.05 %  Sig: APPLY EXTERNALLY TO THE AFFECTED AREA TWICE DAILY    Dispense:  30 g    Refill:  1  . acetaminophen-codeine (TYLENOL #3) 300-30 MG tablet    Sig: TAKE 1 TABLET BY MOUTH EVERY 8 HOURS AS NEEDED FOR MODERATE PAIN    Dispense:  60 tablet    Refill:  1  . gabapentin (NEURONTIN) 300 MG capsule    Sig: Take 1 capsule (300 mg total) by mouth 2 (two) times daily.    Dispense:  60 capsule    Refill:  3  . metoCLOPramide (REGLAN) 10 MG tablet    Sig: Take 1 tablet (10 mg total) by mouth every 8 (eight) hours as needed for nausea.    Dispense:  60 tablet    Refill:  1  . sildenafil (VIAGRA) 50 MG tablet    Sig: Take 1 tablet (50 mg total) by mouth daily as needed for erectile dysfunction. At least 24 hours between doses    Dispense:  10 tablet    Refill:  0  . pantoprazole (PROTONIX) 40 MG tablet    Sig: Take 1 tablet (40 mg  total) by mouth daily.    Dispense:  30 tablet    Refill:  5  . insulin glargine (LANTUS) 100 UNIT/ML injection    Sig: Inject 0.22 mLs (22 Units total) into the skin 2 (two) times daily.    Dispense:  30 mL    Refill:  5    Discontinue previous dose    Follow-up: Return in about 6 weeks (around 12/23/2016) for Follow-up on diabetes mellitus.   [name] MD

## 2016-11-11 NOTE — Progress Notes (Signed)
Was nauseated with vomiting and diarrhea.  This lasted 3-4 days and now today he is fine.  Medication refills- gabapentin, tylenol #3, temovate cream, pantoprazole  BS 224- ate candy one hour prior

## 2016-11-12 ENCOUNTER — Telehealth: Payer: Self-pay | Admitting: Family Medicine

## 2016-11-12 NOTE — Telephone Encounter (Signed)
Writer spoke with patient and discussed the fact that insurance does not cover viagra and that there is no medical reason that patient was prescribed this medication. Patient stated understanding.

## 2016-11-12 NOTE — Telephone Encounter (Signed)
Pt. Sister called stating that pt. Was given an Rx for Viagra and when he went to [facility] the pharmacy stated that they needed authorization from his PCP. Please fr/u with pt.

## 2016-12-04 IMAGING — CR DG CHEST 1V PORT
1 series · 1 of 1 positions shown · non-contrast
Comparison: 05/14/2015

CLINICAL DATA: Chest pain and shortness of breath since this
morning.

EXAM:
PORTABLE CHEST 1 VIEW

[AP]
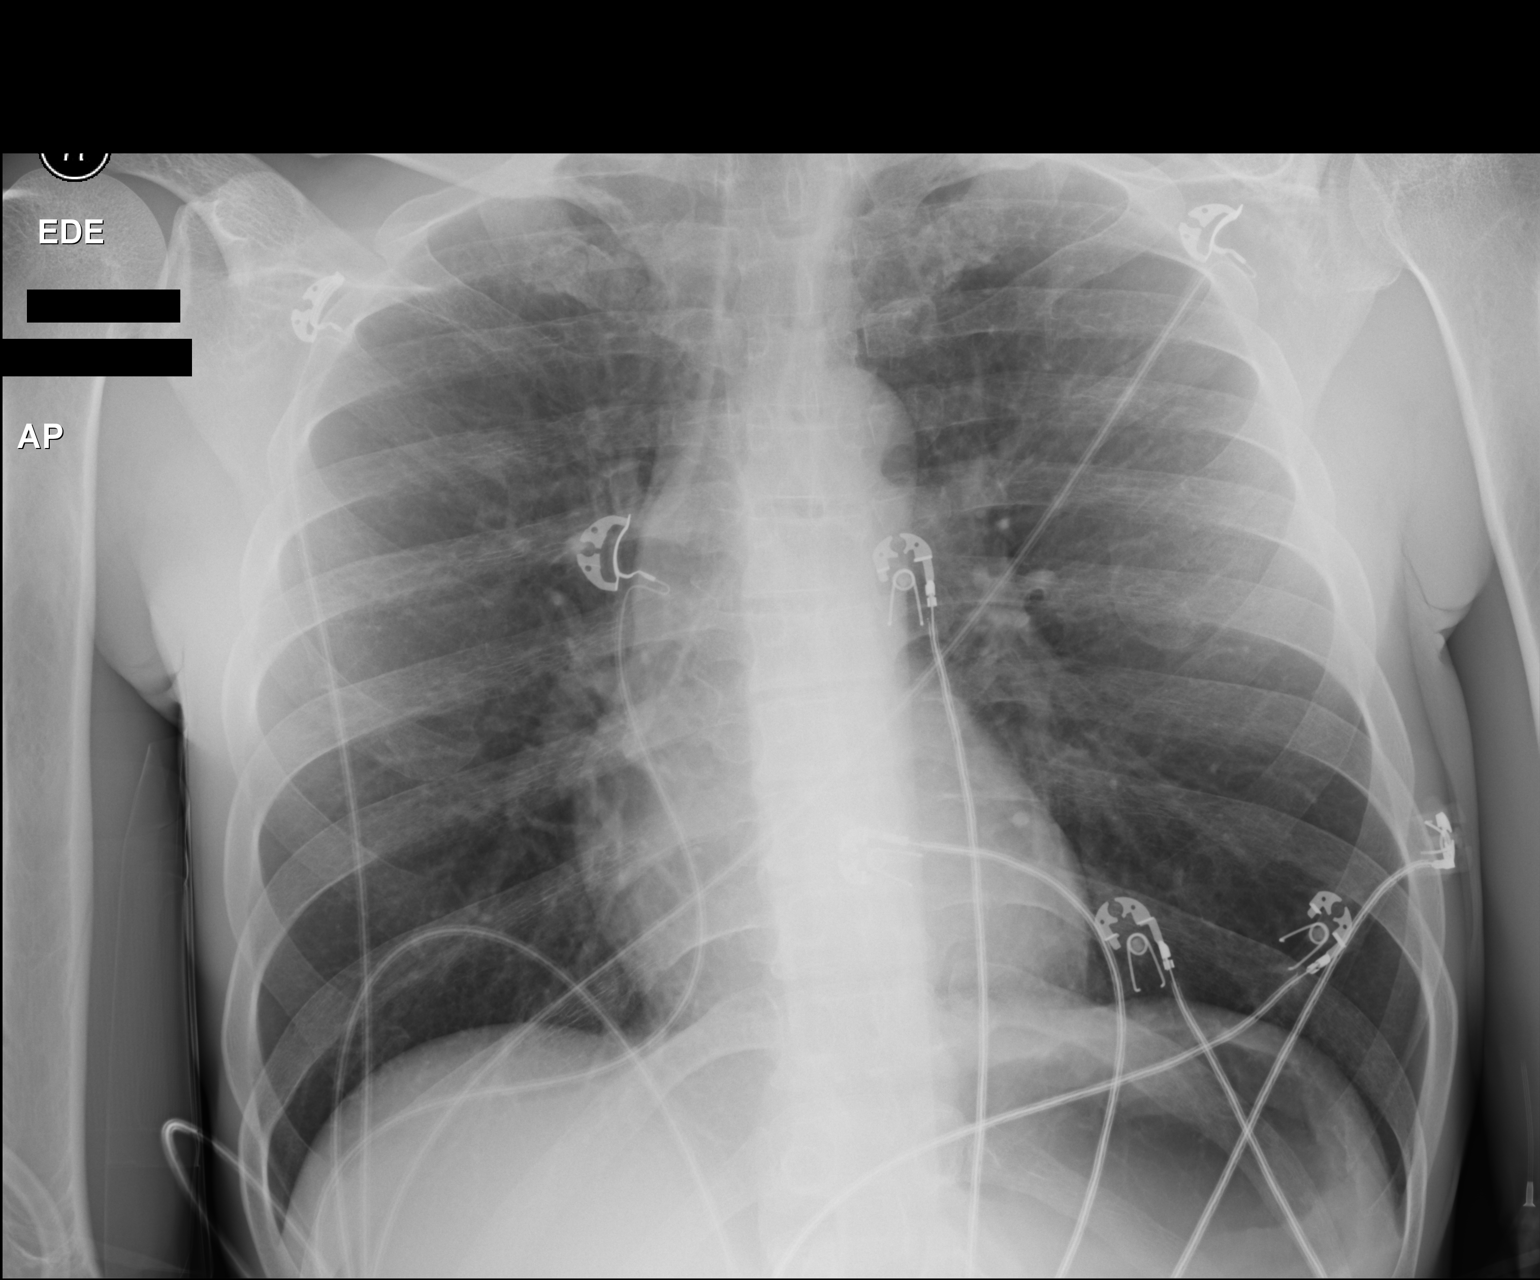

[1 of 1 positions shown; findings below may reference images not displayed]

FINDINGS: Both lungs are clear. Heart and mediastinum are within normal
limits. Trachea is midline. No acute bone abnormality. Negative for
a pneumothorax.
IMPRESSION: No acute chest abnormality.

## 2016-12-24 ENCOUNTER — Encounter: Payer: Self-pay | Admitting: Family Medicine

## 2016-12-24 ENCOUNTER — Ambulatory Visit: Payer: Medicaid Other | Attending: Family Medicine | Admitting: Family Medicine

## 2016-12-24 VITALS — BP 110/77 | HR 102 | Temp 98.1°F | Wt 128.2 lb

## 2016-12-24 DIAGNOSIS — L409 Psoriasis, unspecified: Secondary | ICD-10-CM | POA: Insufficient documentation

## 2016-12-24 DIAGNOSIS — Z91040 Latex allergy status: Secondary | ICD-10-CM | POA: Insufficient documentation

## 2016-12-24 DIAGNOSIS — E1065 Type 1 diabetes mellitus with hyperglycemia: Secondary | ICD-10-CM

## 2016-12-24 DIAGNOSIS — Z794 Long term (current) use of insulin: Secondary | ICD-10-CM | POA: Diagnosis not present

## 2016-12-24 DIAGNOSIS — N529 Male erectile dysfunction, unspecified: Secondary | ICD-10-CM | POA: Diagnosis not present

## 2016-12-24 DIAGNOSIS — Z9119 Patient's noncompliance with other medical treatment and regimen: Secondary | ICD-10-CM | POA: Insufficient documentation

## 2016-12-24 DIAGNOSIS — M170 Bilateral primary osteoarthritis of knee: Secondary | ICD-10-CM | POA: Insufficient documentation

## 2016-12-24 DIAGNOSIS — E1010 Type 1 diabetes mellitus with ketoacidosis without coma: Secondary | ICD-10-CM | POA: Diagnosis present

## 2016-12-24 DIAGNOSIS — K219 Gastro-esophageal reflux disease without esophagitis: Secondary | ICD-10-CM | POA: Diagnosis not present

## 2016-12-24 DIAGNOSIS — M8788 Other osteonecrosis, other site: Secondary | ICD-10-CM | POA: Diagnosis not present

## 2016-12-24 DIAGNOSIS — Z880 Allergy status to penicillin: Secondary | ICD-10-CM | POA: Insufficient documentation

## 2016-12-24 DIAGNOSIS — M87059 Idiopathic aseptic necrosis of unspecified femur: Secondary | ICD-10-CM

## 2016-12-24 DIAGNOSIS — M1990 Unspecified osteoarthritis, unspecified site: Secondary | ICD-10-CM

## 2016-12-24 HISTORY — DX: Unspecified osteoarthritis, unspecified site: M19.90

## 2016-12-24 LAB — GLUCOSE, POCT (MANUAL RESULT ENTRY): POC Glucose: 137 mg/dl — AB (ref 70–99)

## 2016-12-24 LAB — POCT GLYCOSYLATED HEMOGLOBIN (HGB A1C): Hemoglobin A1C: 10.6

## 2016-12-24 MED ORDER — INSULIN GLARGINE 100 UNIT/ML ~~LOC~~ SOLN: 25.0000 [IU] | Freq: Two times a day (BID) | SUBCUTANEOUS | 5 refills | Status: DC

## 2016-12-24 MED ORDER — SILDENAFIL CITRATE 50 MG PO TABS: 50.0000 mg | ORAL_TABLET | Freq: Every day | ORAL | 3 refills | Status: DC | PRN

## 2016-12-24 MED ORDER — ACETAMINOPHEN-CODEINE #3 300-30 MG PO TABS: ORAL_TABLET | ORAL | 1 refills | Status: DC

## 2016-12-24 MED ORDER — ATORVASTATIN CALCIUM 20 MG PO TABS: 20.0000 mg | ORAL_TABLET | Freq: Every day | ORAL | 3 refills | Status: DC

## 2016-12-24 MED ORDER — MELOXICAM 7.5 MG PO TABS: 7.5000 mg | ORAL_TABLET | Freq: Every day | ORAL | 2 refills | Status: DC

## 2016-12-24 MED FILL — ACETAMINOPHEN/COD #3 TABLET: 300-30 | 20 days supply | Qty: 60 | Fill #0

## 2016-12-24 MED FILL — !VIAGRA 50 MG TABLET: 50 | 30 days supply | Qty: 10 | Fill #0

## 2016-12-24 NOTE — Progress Notes (Signed)
Subjective:  Patient ID: Jared Murray, male    DOB: 03/02/1976  Age: 41 y.o. MRN: [id number]  CC: Diabetes   HPI Jared Murray  is a 41 year old male with a history of uncontrolled type 1 diabetes mellitus (A1c 10.6 from today), noncompliance, multiple hospital admissions for DKA, avascular necrosis of both hips, psoriasis who comes into the clinic for a follow-up visit.  He has not been compliant with his Lantus as he sometimes forgets to take it. Compliant with a diabetic diet has also been a challenge as he lives with his brother who eats out most of the time and eats unhealthy and he has to rely on whatever foods are available at home to cook. Sugars are still in the 200s with the lowest at 101 and he denies hypoglycemia. Not up to date on eye exams; last eye exam was last year.  His surgery for avascular necrosis has been on hold pending optimal control of his diabetes and he continues to complain of pain in his hip. In addition he has bilateral knee pain which have worsened with the cold weather and is not totally controlled on Tylenol 3. He informs me he has arthritis in his knees.  He is currently stressed due to problems with his wife and he is undergoing counseling; he thinks this problems affect his ability to remember take his medications. Requesting Viagra as he states erectile dysfunction has played a huge role in his marital problems.  Past Medical History:  Diagnosis Date  . DKA (diabetic ketoacidoses) ([place])   . Eczema   . GERD (gastroesophageal reflux disease)   . Psoriasis   . Substance abuse   . Type 1 diabetes mellitus ([place])     Past Surgical History:  Procedure Laterality Date  . FINGER SURGERY      Allergies  Allergen Reactions  . Latex Itching and Rash  . Penicillins Other (See Comments)    Childhood allergy Has patient had a PCN reaction causing immediate rash, facial/tongue/throat swelling, SOB or lightheadedness with hypotension: NO Has  patient had a PCN reaction causing severe rash involving mucus membranes or skin necrosis:NO Has patient had a PCN reaction that required hospitalization NO Has patient had a PCN reaction occurring within the last 10 years: NO If all of the above answers are "NO", then may proceed with Cephalosporin use.    Outpatient Medications Prior to Visit  Medication Sig Dispense Refill  . clobetasol cream (TEMOVATE) 0.05 % APPLY EXTERNALLY TO THE AFFECTED AREA TWICE DAILY 30 g 1  . gabapentin (NEURONTIN) 300 MG capsule Take 1 capsule (300 mg total) by mouth 2 (two) times daily. 60 capsule 3  . glucose blood (ACCU-CHEK AVIVA PLUS) test strip USE TO CHECK BLOOD SUGAR FOUR TIMES DAILY BEFORE MEALS AND AT BEDTIME 120 each 5  . hydrocortisone 2.5 % cream Apply 1 application topically 2 (two) times daily as needed.     . Insulin Syringe-Needle U-100 (B-D INS SYRINGE 2CC/29GX1/2") 29G X 1/2" 2 ML MISC Check Blood sugar TID and QHS 100 each 12  . Lancet Devices (ACCU-CHEK SOFTCLIX) lancets Use as instructed three times daily. 1 each 5  . metoCLOPramide (REGLAN) 10 MG tablet Take 1 tablet (10 mg total) by mouth every 8 (eight) hours as needed for nausea. 60 tablet 1  . NOVOLOG 100 UNIT/ML injection Inject 0-12 Units into the skin 3 (three) times daily with meals. As per sliding scale 2 vial 6  . pantoprazole (PROTONIX) 40  MG tablet Take 1 tablet (40 mg total) by mouth daily. 30 tablet 5  . traZODone (DESYREL) 100 MG tablet Take 1 tablet (100 mg total) by mouth at bedtime as needed for sleep. 30 tablet 3  . acetaminophen-codeine (TYLENOL #3) 300-30 MG tablet TAKE 1 TABLET BY MOUTH EVERY 8 HOURS AS NEEDED FOR MODERATE PAIN 60 tablet 1  . insulin glargine (LANTUS) 100 UNIT/ML injection Inject 0.22 mLs (22 Units total) into the skin 2 (two) times daily. 30 mL 5  . sildenafil (VIAGRA) 50 MG tablet Take 1 tablet (50 mg total) by mouth daily as needed for erectile dysfunction. At least 24 hours between doses (Patient not  taking: Reported on 12/24/2016) 10 tablet 0   No facility-administered medications prior to visit.     ROS Review of Systems Constitutional: Negative for activity change and appetite change.  HENT: Negative for sinus pressure and sore throat.   Eyes: Negative for visual disturbance.  Respiratory: Negative for cough, chest tightness and shortness of breath.   Cardiovascular: Negative for chest pain and leg swelling.  Gastrointestinal: see hpi  Endocrine: Negative.   Genitourinary: Negative for dysuria.  Musculoskeletal: Negative for myalgias and joint swelling.       Bilateral hip pain  Skin: Positive for rash and skin discoloration Allergic/Immunologic: Negative.   Neurological: Negative for weakness, light-headedness and numbness.  Psychiatric/Behavioral: Negative for suicidal ideas and positive for dysphoric mood.  Objective:  BP 110/77   Pulse (!) 102   Temp 98.1 F (36.7 C) (Oral)   Wt 128 lb 3.2 oz (58.2 kg)   SpO2 99%   BMI 18.39 kg/m   BP/Weight 12/24/2016 2/37/6283 08/23/1759  Systolic BP 607 371 95  Diastolic BP 77 78 65  Wt. (Lbs) 128.2 127.8 125.4  BMI 18.39 18.34 17.99      Physical Exam Constitutional: He is oriented to person, place, and time. He appears well-developed and well-nourished.  Cardiovascular: Tachycardic heart rate, normal heart sounds and intact distal pulses.    No murmur heard. Pulmonary/Chest: Effort normal and breath sounds normal. He has no wheezes. He has no rales. He exhibits no tenderness.  Abdominal: Soft. Bowel sounds are normal. He exhibits no distension and no mass. There is no tenderness.  Musculoskeletal: mild tenderness on ROM of hips , crepitus in both knees on ROM Neurological: He is alert and oriented to person, place, and time.  Skin:  Hypopigmented patches on dorsum of both hands, psoriatic lesions diffusely distributed on body surface.  Psychiatric:  Normal mood  Lab Results  Component Value Date   HGBA1C 10.6  12/24/2016     Assessment & Plan:   1. Type 1 diabetes mellitus with hyperglycemia (HCC) Uncontrolled with A1c of 10.6 which has improved from 11.4 previously Poor control largely secondary to underlying stress and patient forgetting to take his medications. Increased dose of Lantus Continue NovoLog sliding scale Emphasized compliance with medications and diet - POCT glucose (manual entry) - POCT glycosylated hemoglobin (Hb A1C) - atorvastatin (LIPITOR) 20 MG tablet; Take 1 tablet (20 mg total) by mouth daily.  Dispense: 30 tablet; Refill: 3 - insulin glargine (LANTUS) 100 UNIT/ML injection; Inject 0.25 mLs (25 Units total) into the skin 2 (two) times daily.  Dispense: 30 mL; Refill: 5 - CMP14+EGFR; Future - Lipid panel; Future - Microalbumin/Creatinine Ratio, Urine; Future  2. Avascular necrosis of bone of hip, unspecified laterality ([place]) Surgery on hold pending optimal glycemic control - acetaminophen-codeine (TYLENOL #3) 300-30 MG tablet; TAKE 1 TABLET  BY MOUTH EVERY 8 HOURS AS NEEDED FOR MODERATE PAIN  Dispense: 60 tablet; Refill: 1  3. Erectile dysfunction, unspecified erectile dysfunction type Placed on Viagra  4. Psoriasis Improving Continue topical steroid  5. Osteoarthritis of both knees, unspecified osteoarthritis type Placed on meloxicam   Meds ordered this encounter  Medications  . meloxicam (MOBIC) 7.5 MG tablet    Sig: Take 1 tablet (7.5 mg total) by mouth daily.    Dispense:  30 tablet    Refill:  2  . acetaminophen-codeine (TYLENOL #3) 300-30 MG tablet    Sig: TAKE 1 TABLET BY MOUTH EVERY 8 HOURS AS NEEDED FOR MODERATE PAIN    Dispense:  60 tablet    Refill:  1  . atorvastatin (LIPITOR) 20 MG tablet    Sig: Take 1 tablet (20 mg total) by mouth daily.    Dispense:  30 tablet    Refill:  3  . sildenafil (VIAGRA) 50 MG tablet    Sig: Take 1 tablet (50 mg total) by mouth daily as needed for erectile dysfunction. At least 24 hours between doses     Dispense:  30 tablet    Refill:  3  . insulin glargine (LANTUS) 100 UNIT/ML injection    Sig: Inject 0.25 mLs (25 Units total) into the skin 2 (two) times daily.    Dispense:  30 mL    Refill:  5    Discontinue previous dose    Follow-up: Return in about 3 months (around 03/26/2017) for Follow-up on diabetes mellitus.   [name] MD

## 2017-01-01 ENCOUNTER — Ambulatory Visit: Payer: Medicaid Other | Attending: Family Medicine

## 2017-01-01 DIAGNOSIS — E1065 Type 1 diabetes mellitus with hyperglycemia: Secondary | ICD-10-CM | POA: Diagnosis present

## 2017-01-01 NOTE — Progress Notes (Signed)
Patient here for lab visit only 

## 2017-01-02 LAB — CMP14+EGFR
ALBUMIN: 4.8 g/dL (ref 3.5–5.5)
ALK PHOS: 132 IU/L — AB (ref 39–117)
ALT: 62 IU/L — AB (ref 0–44)
AST: 78 IU/L — ABNORMAL HIGH (ref 0–40)
Albumin/Globulin Ratio: 2.2 (ref 1.2–2.2)
BUN / CREAT RATIO: 11 (ref 9–20)
BUN: 8 mg/dL (ref 6–24)
Bilirubin Total: 1.3 mg/dL — ABNORMAL HIGH (ref 0.0–1.2)
CALCIUM: 9.9 mg/dL (ref 8.7–10.2)
CO2: 28 mmol/L (ref 18–29)
Chloride: 95 mmol/L — ABNORMAL LOW (ref 96–106)
Creatinine, Ser: 0.72 mg/dL — ABNORMAL LOW (ref 0.76–1.27)
GFR calc non Af Amer: 117 mL/min/{1.73_m2} (ref >59)
GFR, EST AFRICAN AMERICAN: 135 mL/min/{1.73_m2} (ref >59)
Globulin, Total: 2.2 g/dL (ref 1.5–4.5)
Glucose: 262 mg/dL — ABNORMAL HIGH (ref 65–99)
Potassium: 4.3 mmol/L (ref 3.5–5.2)
Sodium: 138 mmol/L (ref 134–144)
TOTAL PROTEIN: 7 g/dL (ref 6.0–8.5)

## 2017-01-02 LAB — MICROALBUMIN / CREATININE URINE RATIO
Creatinine, Urine: 365.4 mg/dL
MICROALBUM., U, RANDOM: 1137 ug/mL
Microalb/Creat Ratio: 311.2 mg/g creat — ABNORMAL HIGH (ref 0.0–30.0)

## 2017-01-02 LAB — LIPID PANEL
CHOLESTEROL TOTAL: 134 mg/dL (ref 100–199)
Chol/HDL Ratio: 3 ratio (ref 0.0–5.0)
HDL: 44 mg/dL (ref >39)
LDL CALC: 74 mg/dL (ref 0–99)
Triglycerides: 82 mg/dL (ref 0–149)
VLDL CHOLESTEROL CAL: 16 mg/dL (ref 5–40)

## 2017-01-08 ENCOUNTER — Observation Stay (HOSPITAL_COMMUNITY)
Admission: EM | Admit: 2017-01-08 | Discharge: 2017-01-09 | Disposition: A | Discharge status: Home / Self Care | Payer: Medicaid Other | Attending: Oncology | Admitting: Oncology

## 2017-01-08 ENCOUNTER — Encounter (HOSPITAL_COMMUNITY): Payer: Self-pay

## 2017-01-08 DIAGNOSIS — Z91128 Patient's intentional underdosing of medication regimen for other reason: Secondary | ICD-10-CM | POA: Insufficient documentation

## 2017-01-08 DIAGNOSIS — F411 Generalized anxiety disorder: Secondary | ICD-10-CM | POA: Diagnosis not present

## 2017-01-08 DIAGNOSIS — F17210 Nicotine dependence, cigarettes, uncomplicated: Secondary | ICD-10-CM | POA: Insufficient documentation

## 2017-01-08 DIAGNOSIS — R197 Diarrhea, unspecified: Secondary | ICD-10-CM | POA: Insufficient documentation

## 2017-01-08 DIAGNOSIS — E1110 Type 2 diabetes mellitus with ketoacidosis without coma: Secondary | ICD-10-CM | POA: Diagnosis present

## 2017-01-08 DIAGNOSIS — L409 Psoriasis, unspecified: Secondary | ICD-10-CM | POA: Insufficient documentation

## 2017-01-08 DIAGNOSIS — M87852 Other osteonecrosis, left femur: Secondary | ICD-10-CM | POA: Diagnosis not present

## 2017-01-08 DIAGNOSIS — E1010 Type 1 diabetes mellitus with ketoacidosis without coma: Secondary | ICD-10-CM | POA: Diagnosis present

## 2017-01-08 DIAGNOSIS — K219 Gastro-esophageal reflux disease without esophagitis: Secondary | ICD-10-CM | POA: Insufficient documentation

## 2017-01-08 DIAGNOSIS — M87851 Other osteonecrosis, right femur: Secondary | ICD-10-CM | POA: Insufficient documentation

## 2017-01-08 DIAGNOSIS — T383X6A Underdosing of insulin and oral hypoglycemic [antidiabetic] drugs, initial encounter: Secondary | ICD-10-CM | POA: Insufficient documentation

## 2017-01-08 DIAGNOSIS — Z79899 Other long term (current) drug therapy: Secondary | ICD-10-CM | POA: Diagnosis not present

## 2017-01-08 DIAGNOSIS — Z794 Long term (current) use of insulin: Secondary | ICD-10-CM | POA: Insufficient documentation

## 2017-01-08 DIAGNOSIS — E0810 Diabetes mellitus due to underlying condition with ketoacidosis without coma: Secondary | ICD-10-CM

## 2017-01-08 LAB — BASIC METABOLIC PANEL
Anion gap: 14 (ref 5–15)
Anion gap: 14 (ref 5–15)
BUN: 14 mg/dL (ref 6–20)
BUN: 13 mg/dL (ref 6–20)
CALCIUM: 8.9 mg/dL (ref 8.9–10.3)
CALCIUM: 9 mg/dL (ref 8.9–10.3)
CHLORIDE: 108 mmol/L (ref 101–111)
CO2: 18 mmol/L — AB (ref 22–32)
CO2: 19 mmol/L — ABNORMAL LOW (ref 22–32)
CREATININE: 0.99 mg/dL (ref 0.61–1.24)
CREATININE: 1.08 mg/dL (ref 0.61–1.24)
Chloride: 109 mmol/L (ref 101–111)
GFR calc Af Amer: >60 mL/min (ref >60)
GFR calc Af Amer: >60 mL/min (ref >60)
GLUCOSE: 219 mg/dL — AB (ref 65–99)
GLUCOSE: 139 mg/dL — AB (ref 65–99)
POTASSIUM: 3 mmol/L — AB (ref 3.5–5.1)
Potassium: 3.3 mmol/L — ABNORMAL LOW (ref 3.5–5.1)
Sodium: 140 mmol/L (ref 135–145)
Sodium: 142 mmol/L (ref 135–145)

## 2017-01-08 LAB — COMPREHENSIVE METABOLIC PANEL
ALK PHOS: 123 U/L (ref 38–126)
ALT: 44 U/L (ref 17–63)
AST: 50 U/L — ABNORMAL HIGH (ref 15–41)
Albumin: 4.5 g/dL (ref 3.5–5.0)
Anion gap: 21 — ABNORMAL HIGH (ref 5–15)
BILIRUBIN TOTAL: 2.5 mg/dL — AB (ref 0.3–1.2)
BUN: 14 mg/dL (ref 6–20)
CO2: 18 mmol/L — ABNORMAL LOW (ref 22–32)
CREATININE: 1.14 mg/dL (ref 0.61–1.24)
Calcium: 9.2 mg/dL (ref 8.9–10.3)
Chloride: 99 mmol/L — ABNORMAL LOW (ref 101–111)
Glucose, Bld: 376 mg/dL — ABNORMAL HIGH (ref 65–99)
Potassium: 4.1 mmol/L (ref 3.5–5.1)
SODIUM: 138 mmol/L (ref 135–145)
TOTAL PROTEIN: 6.9 g/dL (ref 6.5–8.1)

## 2017-01-08 LAB — URINALYSIS, ROUTINE W REFLEX MICROSCOPIC
Bacteria, UA: NONE SEEN
Bilirubin Urine: NEGATIVE
Glucose, UA: >=500 mg/dL — AB
Hgb urine dipstick: NEGATIVE
Ketones, ur: 80 mg/dL — AB
Leukocytes, UA: NEGATIVE
Nitrite: NEGATIVE
PH: 5 (ref 5.0–8.0)
Protein, ur: NEGATIVE mg/dL
RBC / HPF: NONE SEEN RBC/hpf (ref 0–5)
SPECIFIC GRAVITY, URINE: 1.027 (ref 1.005–1.030)

## 2017-01-08 LAB — CBC WITH DIFFERENTIAL/PLATELET
BASOS ABS: 0 10*3/uL (ref 0.0–0.1)
Basophils Relative: 0 %
EOS ABS: 0 10*3/uL (ref 0.0–0.7)
EOS PCT: 1 %
HCT: 38.2 % — ABNORMAL LOW (ref 39.0–52.0)
Hemoglobin: 13.1 g/dL (ref 13.0–17.0)
Lymphocytes Relative: 21 %
Lymphs Abs: 1 10*3/uL (ref 0.7–4.0)
MCH: 29.4 pg (ref 26.0–34.0)
MCHC: 34.3 g/dL (ref 30.0–36.0)
MCV: 85.7 fL (ref 78.0–100.0)
Monocytes Absolute: 0.3 10*3/uL (ref 0.1–1.0)
Monocytes Relative: 5 %
Neutro Abs: 3.5 10*3/uL (ref 1.7–7.7)
Neutrophils Relative %: 73 %
PLATELETS: 279 10*3/uL (ref 150–400)
RBC: 4.46 MIL/uL (ref 4.22–5.81)
RDW: 12.1 % (ref 11.5–15.5)
WBC: 4.7 10*3/uL (ref 4.0–10.5)

## 2017-01-08 LAB — CBC
HCT: 38.7 % — ABNORMAL LOW (ref 39.0–52.0)
Hemoglobin: 13.5 g/dL (ref 13.0–17.0)
MCH: 30.1 pg (ref 26.0–34.0)
MCHC: 34.9 g/dL (ref 30.0–36.0)
MCV: 86.4 fL (ref 78.0–100.0)
PLATELETS: 252 10*3/uL (ref 150–400)
RBC: 4.48 MIL/uL (ref 4.22–5.81)
RDW: 12.3 % (ref 11.5–15.5)
WBC: 15.7 10*3/uL — ABNORMAL HIGH (ref 4.0–10.5)

## 2017-01-08 LAB — MRSA PCR SCREENING: MRSA by PCR: NEGATIVE

## 2017-01-08 LAB — GLUCOSE, CAPILLARY
GLUCOSE-CAPILLARY: 123 mg/dL — AB (ref 65–99)
GLUCOSE-CAPILLARY: 106 mg/dL — AB (ref 65–99)
Glucose-Capillary: 111 mg/dL — ABNORMAL HIGH (ref 65–99)

## 2017-01-08 LAB — CBG MONITORING, ED
GLUCOSE-CAPILLARY: 357 mg/dL — AB (ref 65–99)
GLUCOSE-CAPILLARY: 334 mg/dL — AB (ref 65–99)
Glucose-Capillary: 341 mg/dL — ABNORMAL HIGH (ref 65–99)
Glucose-Capillary: 365 mg/dL — ABNORMAL HIGH (ref 65–99)
Glucose-Capillary: 276 mg/dL — ABNORMAL HIGH (ref 65–99)
Glucose-Capillary: 177 mg/dL — ABNORMAL HIGH (ref 65–99)

## 2017-01-08 MED ORDER — ATORVASTATIN CALCIUM 20 MG PO TABS
20.0000 mg | ORAL_TABLET | Freq: Every day | ORAL | Status: DC
  Administered 2017-01-09: 20 mg via ORAL
  Filled 2017-01-08 (×2): qty 1

## 2017-01-08 MED ORDER — SODIUM CHLORIDE 0.9% FLUSH: 3.0000 mL | Freq: Two times a day (BID) | INTRAVENOUS | Status: DC

## 2017-01-08 MED ORDER — DEXTROSE-NACL 5-0.45 % IV SOLN
INTRAVENOUS | Status: DC
  Administered 2017-01-08: 20:00:00 via INTRAVENOUS

## 2017-01-08 MED ORDER — METOCLOPRAMIDE HCL 10 MG PO TABS: 10.0000 mg | ORAL_TABLET | Freq: Three times a day (TID) | ORAL | Status: DC | PRN

## 2017-01-08 MED ORDER — ACETAMINOPHEN 325 MG PO TABS
650.0000 mg | ORAL_TABLET | Freq: Four times a day (QID) | ORAL | Status: DC | PRN
Start: 2017-01-08 — End: 2017-01-08

## 2017-01-08 MED ORDER — POTASSIUM CHLORIDE 10 MEQ/100ML IV SOLN
10.0000 meq | INTRAVENOUS | Status: AC
Start: 2017-01-08 — End: 2017-01-09
  Administered 2017-01-08 (×3): 10 meq via INTRAVENOUS
  Filled 2017-01-08 (×3): qty 100

## 2017-01-08 MED ORDER — SODIUM CHLORIDE 0.9 % IV SOLN
INTRAVENOUS | Status: AC
  Administered 2017-01-08: 3.7 [IU]/h via INTRAVENOUS
  Filled 2017-01-08: qty 1

## 2017-01-08 MED ORDER — TRAZODONE HCL 50 MG PO TABS: 100.0000 mg | ORAL_TABLET | Freq: Every evening | ORAL | Status: DC | PRN

## 2017-01-08 MED ORDER — SODIUM CHLORIDE 0.9 % IV SOLN
INTRAVENOUS | Status: DC
  Administered 2017-01-08: 3 [IU]/h via INTRAVENOUS
  Filled 2017-01-08: qty 1

## 2017-01-08 MED ORDER — SODIUM CHLORIDE 0.9 % IV BOLUS (SEPSIS)
1000.0000 mL | Freq: Once | INTRAVENOUS | Status: AC
  Administered 2017-01-08: 1000 mL via INTRAVENOUS

## 2017-01-08 MED ORDER — SODIUM CHLORIDE 0.9 % IV SOLN: INTRAVENOUS | Status: DC

## 2017-01-08 MED ORDER — PANTOPRAZOLE SODIUM 40 MG PO TBEC
40.0000 mg | DELAYED_RELEASE_TABLET | Freq: Every day | ORAL | Status: DC
  Administered 2017-01-09: 40 mg via ORAL
  Filled 2017-01-08: qty 1

## 2017-01-08 MED ORDER — ONDANSETRON HCL 4 MG/2ML IJ SOLN: 4.0000 mg | Freq: Four times a day (QID) | INTRAMUSCULAR | Status: DC | PRN

## 2017-01-08 MED ORDER — INSULIN ASPART 100 UNIT/ML ~~LOC~~ SOLN: 0.0000 [IU] | SUBCUTANEOUS | Status: DC

## 2017-01-08 MED ORDER — POTASSIUM CHLORIDE IN NACL 40-0.9 MEQ/L-% IV SOLN
INTRAVENOUS | Status: AC
  Administered 2017-01-08 – 2017-01-09 (×2): 150 mL/h via INTRAVENOUS
  Filled 2017-01-08 (×2): qty 1000

## 2017-01-08 MED ORDER — ACETAMINOPHEN 650 MG RE SUPP
650.0000 mg | Freq: Four times a day (QID) | RECTAL | Status: DC | PRN
Start: 2017-01-08 — End: 2017-01-08

## 2017-01-08 MED ORDER — DEXTROSE-NACL 5-0.45 % IV SOLN: INTRAVENOUS | Status: DC

## 2017-01-08 MED ORDER — ENOXAPARIN SODIUM 40 MG/0.4ML ~~LOC~~ SOLN
40.0000 mg | SUBCUTANEOUS | Status: DC
  Administered 2017-01-08: 40 mg via SUBCUTANEOUS
  Filled 2017-01-08 (×2): qty 0.4

## 2017-01-08 MED ORDER — ENOXAPARIN SODIUM 40 MG/0.4ML ~~LOC~~ SOLN: 40.0000 mg | SUBCUTANEOUS | Status: DC

## 2017-01-08 MED ORDER — ACETAMINOPHEN-CODEINE #3 300-30 MG PO TABS: 1.0000 | ORAL_TABLET | Freq: Three times a day (TID) | ORAL | Status: DC | PRN

## 2017-01-08 MED ORDER — POTASSIUM CHLORIDE 10 MEQ/100ML IV SOLN: 10.0000 meq | INTRAVENOUS | Status: DC

## 2017-01-08 MED ORDER — GABAPENTIN 300 MG PO CAPS
300.0000 mg | ORAL_CAPSULE | Freq: Two times a day (BID) | ORAL | Status: DC
  Administered 2017-01-08 – 2017-01-09 (×2): 300 mg via ORAL
  Filled 2017-01-08 (×2): qty 1

## 2017-01-08 MED ORDER — ONDANSETRON HCL 4 MG PO TABS: 4.0000 mg | ORAL_TABLET | Freq: Four times a day (QID) | ORAL | Status: DC | PRN

## 2017-01-08 MED ORDER — INSULIN GLARGINE 100 UNIT/ML ~~LOC~~ SOLN
25.0000 [IU] | Freq: Every day | SUBCUTANEOUS | Status: DC
  Administered 2017-01-08 – 2017-01-09 (×2): 25 [IU] via SUBCUTANEOUS
  Filled 2017-01-08 (×2): qty 0.25

## 2017-01-08 MED ORDER — SODIUM CHLORIDE 0.9 % IV SOLN
INTRAVENOUS | Status: DC
  Administered 2017-01-08: 17:00:00 via INTRAVENOUS

## 2017-01-08 NOTE — ED Notes (Signed)
Paged MD again. Incorrect bed placement order placed.

## 2017-01-08 NOTE — ED Notes (Signed)
Bedside commode placed at pts bedside -- pt independent to bedside commode

## 2017-01-08 NOTE — ED Provider Notes (Signed)
[place] DEPT Provider Note   CSN: [id number] Arrival date & time: 01/08/17  9150     History   Chief Complaint Chief Complaint  Patient presents with  . Diarrhea  . Hyperglycemia    HPI Jared Murray is a 41 y.o. male.  Patient is a 41 year old male with a history of diabetes, insulin-dependent, who presents with nausea and vomiting. He states he's been out of his insulin needles for 2 days. He started having nausea vomiting and some loose stools this morning. He denies abdominal pain. No fevers. No chest pain or shortness of breath. He's been admitted multiple times in the past for DKA.      Past Medical History:  Diagnosis Date  . DKA (diabetic ketoacidoses) ([place])   . Eczema   . GERD (gastroesophageal reflux disease)   . Psoriasis   . Substance abuse   . Type 1 diabetes mellitus [facility])     Patient Active Problem List   Diagnosis Date Noted  . Osteoarthritis 12/24/2016  . Hyperbilirubinemia   . Acute pericarditis 03/31/2016  . Non compliance w medication regimen 01/07/2016  . Generalized anxiety disorder 11/21/2015  . Non compliance with medical treatment 11/21/2015  . DKA (diabetic ketoacidoses) ([place]) 11/02/2015  . Leukocytosis 11/02/2015  . Tobacco use disorder 09/13/2015  . Nausea & vomiting 08/23/2015  . Type 1 diabetes mellitus with hyperglycemia ([place])   . Diabetic ketoacidosis without coma associated with type 1 diabetes mellitus ([place])   . SIRS (systemic inflammatory response syndrome) (HCC)   . Avascular necrosis of bone of hip ([place])   . DKA (diabetic ketoacidosis) ([place]) 05/14/2015  . Diabetic ketoacidosis associated with other specified diabetes mellitus   . Lactic acidosis   . Cutaneous abscess of chest wall   . Diabetes type 1, uncontrolled ([place])   . DKA, type 1, not at goal [facility]) 04/03/2015  . Chest wall abscess 04/03/2015  . Avascular necrosis ([place]) 02/12/2015  . Type 1 diabetes mellitus ([place]) 01/29/2015  . Sepsis ([place]) 01/22/2015  .  GERD (gastroesophageal reflux disease) 01/22/2015  . AKI (acute kidney injury) ([place]) 01/22/2015  . Hyperkalemia   . Viral gastroenteritis 07/01/2014  . Left hip pain 04/18/2014  . Dehydration 01/23/2014  . Psoriasis 07/01/2013  . Loss of weight 03/17/2013  . Hypokalemia 03/17/2013  . Eczema 03/17/2013  . Protein-calorie malnutrition, severe ([place]) 03/17/2013    Past Surgical History:  Procedure Laterality Date  . FINGER SURGERY         Home Medications    Prior to Admission medications   Medication Sig Start Date End Date Taking? Authorizing Provider  acetaminophen-codeine (TYLENOL #3) 300-30 MG tablet TAKE 1 TABLET BY MOUTH EVERY 8 HOURS AS NEEDED FOR MODERATE PAIN 12/24/16  Yes Amao, Charlane Ferretti, MD  clobetasol cream (TEMOVATE) 0.05 % APPLY EXTERNALLY TO THE AFFECTED AREA TWICE DAILY 11/11/16  Yes [name], MD  gabapentin (NEURONTIN) 300 MG capsule Take 1 capsule (300 mg total) by mouth 2 (two) times daily. 11/11/16  Yes Amao, Charlane Ferretti, MD  glucose blood (ACCU-CHEK AVIVA PLUS) test strip USE TO CHECK BLOOD SUGAR FOUR TIMES DAILY BEFORE MEALS AND AT BEDTIME 04/10/16  Yes Amao, Enobong, MD  hydrocortisone 2.5 % cream Apply 1 application topically 2 (two) times daily as needed.    Yes [provider]  insulin glargine (LANTUS) 100 UNIT/ML injection Inject 0.25 mLs (25 Units total) into the skin 2 (two) times daily. 12/24/16  Yes [name], MD  Insulin Syringe-Needle U-100 (B-D INS SYRINGE 2CC/29GX1/2")  29G X 1/2" 2 ML MISC Check Blood sugar TID and QHS 03/22/14  Yes Jegede, Marlena Clipper, MD  Lancet Devices [facility]) lancets Use as instructed three times daily. 04/10/16  Yes [name], MD  metoCLOPramide (REGLAN) 10 MG tablet Take 1 tablet (10 mg total) by mouth every 8 (eight) hours as needed for nausea. 11/11/16  Yes Amao, Charlane Ferretti, MD  NOVOLOG 100 UNIT/ML injection Inject 0-12 Units into the skin 3 (three) times daily with meals. As per sliding scale 09/18/16  Yes  Amao, Enobong, MD  pantoprazole (PROTONIX) 40 MG tablet Take 1 tablet (40 mg total) by mouth daily. 11/11/16  Yes [name], MD  sildenafil (VIAGRA) 50 MG tablet Take 1 tablet (50 mg total) by mouth daily as needed for erectile dysfunction. At least 24 hours between doses 12/24/16  Yes Amao, Charlane Ferretti, MD  traZODone (DESYREL) 100 MG tablet Take 1 tablet (100 mg total) by mouth at bedtime as needed for sleep. 09/18/16  Yes [name], MD  atorvastatin (LIPITOR) 20 MG tablet Take 1 tablet (20 mg total) by mouth daily. 12/24/16   [name], MD  meloxicam (MOBIC) 7.5 MG tablet Take 1 tablet (7.5 mg total) by mouth daily. 12/24/16   [name], MD    Family History Family History  Problem Relation Age of Onset  . Hypertension Father   . Diabetes Unknown        multiple  . Lupus Cousin   . Stroke Maternal Grandmother   . Stroke Paternal Grandmother     Social History Social History  Substance Use Topics  . Smoking status: Current Some Day Smoker    Packs/day: 0.25    Years: 10.00    Types: Cigarettes  . Smokeless tobacco: Never Used  . Alcohol use 0.6 - 1.2 oz/week    1 - 2 Shots of liquor per week     Comment: last time tuesday     Allergies   Latex and Penicillins   Review of Systems Review of Systems  Constitutional: Negative for chills, diaphoresis, fatigue and fever.  HENT: Negative for congestion, rhinorrhea and sneezing.   Eyes: Negative.   Respiratory: Negative for cough, chest tightness and shortness of breath.   Cardiovascular: Negative for chest pain and leg swelling.  Gastrointestinal: Positive for nausea and vomiting. Negative for abdominal pain, blood in stool and diarrhea.  Genitourinary: Negative for difficulty urinating, flank pain, frequency and hematuria.  Musculoskeletal: Negative for arthralgias and back pain.  Skin: Negative for rash.  Neurological: Negative for dizziness, speech difficulty, weakness, numbness and headaches.     Physical  Exam Updated Vital Signs BP 101/67   Pulse 84   Temp 98 F (36.7 C) (Oral)   Resp 13   Ht 5\' 10"  (1.778 m)   Wt 59 kg (130 lb)   SpO2 98%   BMI 18.65 kg/m   Physical Exam  Constitutional: He is oriented to person, place, and time. He appears well-developed and well-nourished.  HENT:  Head: Normocephalic and atraumatic.  Smells of ketones  Eyes: Pupils are equal, round, and reactive to light.  Neck: Normal range of motion. Neck supple.  Cardiovascular: Normal rate, regular rhythm and normal heart sounds.   Pulmonary/Chest: Effort normal and breath sounds normal. No respiratory distress. He has no wheezes. He has no rales. He exhibits no tenderness.  Abdominal: Soft. Bowel sounds are normal. There is no tenderness. There is no rebound and no guarding.  Musculoskeletal: Normal range of motion. He exhibits no  edema.  Lymphadenopathy:    He has no cervical adenopathy.  Neurological: He is alert and oriented to person, place, and time.  Skin: Skin is warm and dry. No rash noted.  Psychiatric: He has a normal mood and affect.     ED Treatments / Results  Labs (all labs ordered are listed, but only abnormal results are displayed) Labs Reviewed  COMPREHENSIVE METABOLIC PANEL - Abnormal; Notable for the following:       Result Value   Chloride 99 (*)    CO2 18 (*)    Glucose, Bld 376 (*)    AST 50 (*)    Total Bilirubin 2.5 (*)    Anion gap 21 (*)    All other components within normal limits  CBC WITH DIFFERENTIAL/PLATELET - Abnormal; Notable for the following:    HCT 38.2 (*)    All other components within normal limits  URINALYSIS, ROUTINE W REFLEX MICROSCOPIC - Abnormal; Notable for the following:    Color, Urine STRAW (*)    Glucose, UA >=500 (*)    Ketones, ur 80 (*)    Squamous Epithelial / LPF 0-5 (*)    All other components within normal limits  CBG MONITORING, ED - Abnormal; Notable for the following:    Glucose-Capillary 341 (*)    All other components within  normal limits  CBG MONITORING, ED - Abnormal; Notable for the following:    Glucose-Capillary 365 (*)    All other components within normal limits    EKG  EKG Interpretation  Date/Time:  Thursday Jan 08 2017 11:45:43 EDT Ventricular Rate:  68 PR Interval:    QRS Duration: 137 QT Interval:  428 QTC Calculation: 456 R Axis:   102 Text Interpretation:  Sinus rhythm Atrial premature complex Short PR interval RBBB and LPFB Inferior infarct, old Lateral leads are also involved Baseline wander in lead(s) V6 since last tracing no significant change Confirmed by Malvin Johns [phone]) on 01/08/2017 12:18:48 PM       Radiology No results found.  Procedures Procedures (including critical care time)  Medications Ordered in ED Medications  dextrose 5 %-0.45 % sodium chloride infusion (not administered)  insulin regular (NOVOLIN R,HUMULIN R) 100 Units in sodium chloride 0.9 % 100 mL (1 Units/mL) infusion (not administered)  0.9 %  sodium chloride infusion (not administered)  sodium chloride 0.9 % bolus 1,000 mL (0 mLs Intravenous Stopped 01/08/17 1235)  sodium chloride 0.9 % bolus 1,000 mL (1,000 mLs Intravenous New Bag/Given 01/08/17 1505)     Initial Impression / Assessment and Plan / ED Course  I have reviewed the triage vital signs and the nursing notes.  Pertinent labs & imaging results that were available during my care of the patient were reviewed by me and considered in my medical decision making (see chart for details).     Patient presents with elevated blood sugars. He's been out of his insulin syringes for 2 days. He does have evidence of DKA based on his lab work. He has an anion gap of 21. He was given IV fluids and started on glucose stabilizer. I will consult unassigned medicine for admission.  I spoke with the IM teaching service resident who will admit the pt.  Final Clinical Impressions(s) / ED Diagnoses   Final diagnoses:  Diabetic ketoacidosis without coma  associated with diabetes mellitus due to underlying condition [facility])    New Prescriptions New Prescriptions   No medications on file     Malvin Johns, MD  01/08/17 1556  

## 2017-01-08 NOTE — ED Triage Notes (Signed)
Patient arrived to ED via [facility]. EMS reports: Patient awakened approx 0500 with diarrhea. Also, reports vomiting x 1. No nausea. Called EMS. Several bouts of diarrhea reported.  EMS reports CBG 410.  Patient received approx 500 ml NS en route. 20 gauge in L AC. VSS. BP 112/70, Pulse 90, Resp 20, 99% on room air. 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

## 2017-01-08 NOTE — H&P (Signed)
Date: 01/08/2017               Patient Name:  Jared Murray MRN: [id number]  DOB: [birth year] Age / Sex: 41 y.o., male   PCP: [name], MD         Medical Service: Internal Medicine Teaching Service         Attending Physician: Dr. Malvin Johns, MD    First Contact: Dr. Reesa Chew Pager: [phone]  Second Contact: Dr. Marlowe Sax Pager: [phone]       After Hours (After 5p/  First Contact Pager: [phone]  weekends / holidays): Second Contact Pager: [phone]   Chief Complaint: Nausea, Vomiting ,Diarrhea and hyperglycemia  History of Present Illness: Jared Murray is a 41 y.o. man with PMHx of uncontrolled type 1 diabetes mellitus, noncompliance, multiple hospital admissions for DKA, avascular necrosis of both hips, psoriasis came to with C/O N/V/D since this morning.He ran out of his insulin Syringes 2 days ago.  Patient was in his usual state of health up until this morning, when he developed watery diarrhea, had 12 episodes since this morning, later developed mild nausea and vomiting. He had 3-4 episodes of small vomitus since morning. He also states that he knows that he is going in DKA as his DKA always starts with diarrhea. He denies any upper respiratory symptoms, fever, chills, chest pain or shortness of breath. He denies any abdominal pain, recent change in his appetite or weight. He do endorse increased frequency of urination since this morning, denies any dysuria or hematuria.  In ED he was found to have hyperglycemic at 376, bicarbonate of 18 and anion gap of 21 with positive keystones in urine. He was admitted for DKA management.  Meds:  Current Meds  Medication Sig  . acetaminophen-codeine (TYLENOL #3) 300-30 MG tablet TAKE 1 TABLET BY MOUTH EVERY 8 HOURS AS NEEDED FOR MODERATE PAIN  . clobetasol cream (TEMOVATE) 0.05 % APPLY EXTERNALLY TO THE AFFECTED AREA TWICE DAILY  . gabapentin (NEURONTIN) 300 MG capsule Take 1 capsule (300 mg total) by mouth 2 (two) times  daily.  Marland Kitchen glucose blood (ACCU-CHEK AVIVA PLUS) test strip USE TO CHECK BLOOD SUGAR FOUR TIMES DAILY BEFORE MEALS AND AT BEDTIME  . hydrocortisone 2.5 % cream Apply 1 application topically 2 (two) times daily as needed.   . insulin glargine (LANTUS) 100 UNIT/ML injection Inject 0.25 mLs (25 Units total) into the skin 2 (two) times daily.  . Insulin Syringe-Needle U-100 (B-D INS SYRINGE 2CC/29GX1/2") 29G X 1/2" 2 ML MISC Check Blood sugar TID and QHS  . Lancet Devices (ACCU-CHEK SOFTCLIX) lancets Use as instructed three times daily.  . metoCLOPramide (REGLAN) 10 MG tablet Take 1 tablet (10 mg total) by mouth every 8 (eight) hours as needed for nausea.  Marland Kitchen NOVOLOG 100 UNIT/ML injection Inject 0-12 Units into the skin 3 (three) times daily with meals. As per sliding scale  . pantoprazole (PROTONIX) 40 MG tablet Take 1 tablet (40 mg total) by mouth daily.  . sildenafil (VIAGRA) 50 MG tablet Take 1 tablet (50 mg total) by mouth daily as needed for erectile dysfunction. At least 24 hours between doses  . traZODone (DESYREL) 100 MG tablet Take 1 tablet (100 mg total) by mouth at bedtime as needed for sleep.     Allergies: Allergies as of 01/08/2017 - Review Complete 01/08/2017  Allergen Reaction Noted  . Latex Itching and Rash 11/02/2015  . Penicillins Other (See Comments) 03/16/2013   Past Medical History:  Diagnosis Date  . DKA (diabetic ketoacidoses) ([place])   . Eczema   . GERD (gastroesophageal reflux disease)   . Psoriasis   . Substance abuse   . Type 1 diabetes mellitus ([facility])     Family History: Mom and brother has borderline diabetes. Brother recently diagnosed with prostatic cancer at the age of 66.  Social History: Current smoker, smokes 3 cigarettes per day for the past 3 years, used to smoke 1 pack per day for many years, quit for one year and restarted 3 years ago. Occasionally drink alcohol. Used to have marijuana-last use 5 month ago.  Review of Systems: A complete ROS was  negative except as per HPI.   Physical Exam: Blood pressure 101/67, pulse 84, temperature 98 F (36.7 C), temperature source Oral, resp. rate 13, height 5\' 10"  (1.778 m), weight 130 lb (59 kg), SpO2 98 %. Vitals:   01/08/17 1345 01/08/17 1415 01/08/17 1445 01/08/17 1515  BP:  113/61 (!) 90/55 101/67  Pulse: 82  90 84  Resp: 16 19 13 13   Temp:      TempSrc:      SpO2: 98%  100% 98%  Weight:      Height:       General: Vital signs reviewed.  Patient is well-developed and well-nourished, in no acute distress and cooperative with exam.  Head: Normocephalic and atraumatic. Eyes: EOMI, conjunctivae normal, no scleral icterus.  Neck: Supple, trachea midline, normal ROM, no JVD, masses, thyromegaly, or carotid bruit present.  Cardiovascular: RRR, S1 normal, S2 normal, no murmurs, gallops, or rubs. Pulmonary/Chest: Clear to auscultation bilaterally, no wheezes, rales, or rhonchi. Abdominal: Soft, non-tender, non-distended, BS +, no masses, organomegaly, or guarding present.  Musculoskeletal: No joint deformities, erythema, or stiffness, ROM full and nontender. Extremities: No lower extremity edema bilaterally,  pulses symmetric and intact bilaterally. No cyanosis or clubbing. Neurological: A&O x3, Strength is normal and symmetric bilaterally, cranial nerve II-XII are grossly intact, no focal motor deficit, sensory intact to light touch bilaterally.  Skin: Warm, dry and intact. No rashes or erythema. Psychiatric: Normal mood and affect. speech and behavior is normal. Cognition and memory are normal.  Labs. CBC    Component Value Date/Time   WBC 4.7 01/08/2017 1140   RBC 4.46 01/08/2017 1140   HGB 13.1 01/08/2017 1140   HCT 38.2 (L) 01/08/2017 1140   PLT 279 01/08/2017 1140   MCV 85.7 01/08/2017 1140   MCH 29.4 01/08/2017 1140   MCHC 34.3 01/08/2017 1140   RDW 12.1 01/08/2017 1140   LYMPHSABS 1.0 01/08/2017 1140   MONOABS 0.3 01/08/2017 1140   EOSABS 0.0 01/08/2017 1140   BASOSABS  0.0 01/08/2017 1140   CMP Latest Ref Rng & Units 01/08/2017 01/01/2017 09/18/2016  Glucose 65 - 99 mg/dL 376(H) 262(H) 249(H)  BUN 6 - 20 mg/dL 14 8 12   Creatinine 0.61 - 1.24 mg/dL 1.14 0.72(L) 0.92  Sodium 135 - 145 mmol/L 138 138 137  Potassium 3.5 - 5.1 mmol/L 4.1 4.3 4.2  Chloride 101 - 111 mmol/L 99(L) 95(L) 96(L)  CO2 22 - 32 mmol/L 18(L) 28 31  Calcium 8.9 - 10.3 mg/dL 9.2 9.9 9.6  Total Protein 6.5 - 8.1 g/dL 6.9 7.0 -  Total Bilirubin 0.3 - 1.2 mg/dL 2.5(H) 1.3(H) -  Alkaline Phos 38 - 126 U/L 123 132(H) -  AST 15 - 41 U/L 50(H) 78(H) -  ALT 17 - 63 U/L 44 62(H) -   Urinalysis    Component Value Date/Time  [place] (A) 01/08/2017 [place] 01/08/2017 0947   LABSPEC 1.027 01/08/2017 0947   PHURINE 5.0 01/08/2017 0947   GLUCOSEU >=500 (A) 01/08/2017 0947   HGBUR NEGATIVE 01/08/2017 [place] 01/08/2017 0947   BILIRUBINUR neg 03/16/2015 1230   KETONESUR 80 (A) 01/08/2017 0947   PROTEINUR NEGATIVE 01/08/2017 0947   UROBILINOGEN 0.2 06/25/2015 0913   NITRITE NEGATIVE 01/08/2017 0947   LEUKOCYTESUR NEGATIVE 01/08/2017 0947   EKG:  Date/Time:                  Thursday Jan 08 2017 11:45:43 EDT Ventricular Rate:   68 PR Interval:                        QRS Duration:        137 QT Interval:                      428 QTC Calculation:    456 R Axis:                         102 Text Interpretation:  Sinus rhythm Atrial premature complex Short PR interval RBBB and LPFB Inferior infarct, old Lateral leads are also involved Baseline wander in lead(s) V6 since last tracing no significant change   Assessment & Plan by Problem: [name] is a 41 y.o. man with PMHx of uncontrolled type 1 diabetes mellitus, noncompliance, multiple hospital admissions for DKA, avascular necrosis of both hips, psoriasis came to with C/O N/V/D since this morning.He ran out of his insulin 2 days ago.  DKA. Pt. With uncontrolled diabetes, A1c on 12/22/16  was 10.6., recently ran out of insulin for last two days,presented with DKA with hyperglycemia, decreased bicarb.,anion gap of 21 and positive urinary ketones. -Admit to step down. -DKA protocol.  Avascular necrosis of both hips. According to his PCP notes surgery is on hold pending optimal glycemic control. He was on Tylenol No. 3 when necessary for pain. -Continue his home pain management.  Psoriasis. No acute exacerbation. -Continue home topical steroid.  CODE STATUS. Full DVT prophylaxis. Lovenox Diet. Nothing by mouth until Closes 2.  Dispo: Admit patient to Observation with expected length of stay less than 2 midnights.  SignedLorella Nimrod, MD 01/08/2017, 3:52 PM  Pager: [phone]

## 2017-01-09 ENCOUNTER — Encounter (HOSPITAL_COMMUNITY): Payer: Self-pay

## 2017-01-09 DIAGNOSIS — Z91128 Patient's intentional underdosing of medication regimen for other reason: Secondary | ICD-10-CM | POA: Diagnosis not present

## 2017-01-09 DIAGNOSIS — Z8042 Family history of malignant neoplasm of prostate: Secondary | ICD-10-CM | POA: Diagnosis not present

## 2017-01-09 DIAGNOSIS — Z880 Allergy status to penicillin: Secondary | ICD-10-CM | POA: Diagnosis not present

## 2017-01-09 DIAGNOSIS — E1010 Type 1 diabetes mellitus with ketoacidosis without coma: Secondary | ICD-10-CM

## 2017-01-09 DIAGNOSIS — Z833 Family history of diabetes mellitus: Secondary | ICD-10-CM | POA: Diagnosis not present

## 2017-01-09 DIAGNOSIS — F17210 Nicotine dependence, cigarettes, uncomplicated: Secondary | ICD-10-CM | POA: Diagnosis not present

## 2017-01-09 DIAGNOSIS — M8789 Other osteonecrosis, multiple sites: Secondary | ICD-10-CM

## 2017-01-09 DIAGNOSIS — L409 Psoriasis, unspecified: Secondary | ICD-10-CM | POA: Diagnosis not present

## 2017-01-09 DIAGNOSIS — Z91040 Latex allergy status: Secondary | ICD-10-CM | POA: Diagnosis not present

## 2017-01-09 DIAGNOSIS — T383X6A Underdosing of insulin and oral hypoglycemic [antidiabetic] drugs, initial encounter: Secondary | ICD-10-CM | POA: Diagnosis not present

## 2017-01-09 LAB — CBC
HCT: 36.8 % — ABNORMAL LOW (ref 39.0–52.0)
Hemoglobin: 12.6 g/dL — ABNORMAL LOW (ref 13.0–17.0)
MCH: 29.7 pg (ref 26.0–34.0)
MCHC: 34.2 g/dL (ref 30.0–36.0)
MCV: 86.8 fL (ref 78.0–100.0)
Platelets: 280 10*3/uL (ref 150–400)
RBC: 4.24 MIL/uL (ref 4.22–5.81)
RDW: 12.6 % (ref 11.5–15.5)
WBC: 10.3 10*3/uL (ref 4.0–10.5)

## 2017-01-09 LAB — GLUCOSE, CAPILLARY
GLUCOSE-CAPILLARY: 129 mg/dL — AB (ref 65–99)
GLUCOSE-CAPILLARY: 39 mg/dL — AB (ref 65–99)
GLUCOSE-CAPILLARY: 41 mg/dL — AB (ref 65–99)
GLUCOSE-CAPILLARY: 107 mg/dL — AB (ref 65–99)
Glucose-Capillary: 120 mg/dL — ABNORMAL HIGH (ref 65–99)
Glucose-Capillary: 193 mg/dL — ABNORMAL HIGH (ref 65–99)
Glucose-Capillary: 86 mg/dL (ref 65–99)
Glucose-Capillary: 89 mg/dL (ref 65–99)
Glucose-Capillary: 93 mg/dL (ref 65–99)

## 2017-01-09 LAB — BASIC METABOLIC PANEL
ANION GAP: 11 (ref 5–15)
ANION GAP: 9 (ref 5–15)
BUN: 11 mg/dL (ref 6–20)
BUN: 9 mg/dL (ref 6–20)
CALCIUM: 8.4 mg/dL — AB (ref 8.9–10.3)
CO2: 21 mmol/L — ABNORMAL LOW (ref 22–32)
CO2: 19 mmol/L — ABNORMAL LOW (ref 22–32)
CREATININE: 0.72 mg/dL (ref 0.61–1.24)
Calcium: 8.7 mg/dL — ABNORMAL LOW (ref 8.9–10.3)
Chloride: 108 mmol/L (ref 101–111)
Chloride: 109 mmol/L (ref 101–111)
Creatinine, Ser: 0.67 mg/dL (ref 0.61–1.24)
GFR calc Af Amer: >60 mL/min (ref >60)
GFR calc Af Amer: >60 mL/min (ref >60)
GFR calc non Af Amer: >60 mL/min (ref >60)
GLUCOSE: 97 mg/dL (ref 65–99)
GLUCOSE: 112 mg/dL — AB (ref 65–99)
Potassium: 3.9 mmol/L (ref 3.5–5.1)
Potassium: 3.8 mmol/L (ref 3.5–5.1)
Sodium: 140 mmol/L (ref 135–145)
Sodium: 137 mmol/L (ref 135–145)

## 2017-01-09 MED ORDER — LOPERAMIDE HCL 2 MG PO CAPS: 2.0000 mg | ORAL_CAPSULE | ORAL | 0 refills | Status: DC | PRN

## 2017-01-09 MED ORDER — LOPERAMIDE HCL 2 MG PO CAPS
2.0000 mg | ORAL_CAPSULE | ORAL | Status: DC | PRN
  Administered 2017-01-09: 4 mg via ORAL
  Filled 2017-01-09: qty 1

## 2017-01-09 MED ORDER — INSULIN ASPART 100 UNIT/ML ~~LOC~~ SOLN
0.0000 [IU] | Freq: Three times a day (TID) | SUBCUTANEOUS | Status: DC
  Administered 2017-01-09: 2 [IU] via SUBCUTANEOUS

## 2017-01-09 NOTE — Progress Notes (Signed)
Discharge Note:  Patient alert and oriented X 4 and in no distress. Patient given discharge instructions regarding signs and symptoms to report, treatment of hypoglycemic episodes, medications, diet, activity, smoking cessation, and upcoming appointments.  He verbalized understanding of all instructions. Peripheral IV and telemetry discontinued.  Patient confirmed that he had all of his personal belongings.  Patient did have a hypoglycemic episode prior to discharge which was treated. His blood glucose was 193 just before discharge.  He was transported out via wheelchair by NT.

## 2017-01-09 NOTE — Progress Notes (Signed)
   Subjective: Patient was feeling better until this morning. His DKA has been resolved. Still complaining of diarrhea he denies any abdominal pain. He denies any fever or chills. He denies any recent history of eating from outside. Denies any sick contacts.  Objective:  Vital signs in last 24 hours: Vitals:   01/08/17 2245 01/09/17 0026 01/09/17 0318 01/09/17 0758  BP: 117/81 130/90 116/87   Pulse: 83 72 89   Resp: (!) 8 11 11    Temp:  98.4 F (36.9 C) 98.1 F (36.7 C) (!) 96.9 F (36.1 C)  TempSrc:  Oral Oral Axillary  SpO2: 100% 100% 100%   Weight:      Height:       Gen. Well-developed man, in no acute distress. Lungs. Clear bilaterally. CV. Regular rate and rhythm. Abdomen. Soft, nontender, nondistended, bowel sounds hyperactive. Extremities. No edema, no cyanosis, pulses 2+ bilaterally.  Assessment/Plan:  Jared L [name] a 41 y.o.man with PMHx of uncontrolled type 1 diabetes mellitus, noncompliance, multiple hospital admissions for DKA, avascular necrosis of both hips, psoriasis came to with C/O N/V/D since this morning.He ran out of his insulin 2 days ago.  DKA. Resolved.  Uncontrolled type 1 diabetes. He has a long history of being noncompliant intermittently, he needs more education for a better insight of his disease process. He can resume his home regimen on discharge. PCP should be able to monitor and titrate his insulin.   Diarrhea. Most likely viral and usually self-limited,  He  can use Imodium.  Avascular necrosis of both hips. Most likely because of chronic use of steroid for his psoriasis. He needs a better glycemic control for several his surgery.  Psoriasis. No acute exacerbation. -Continue home topical steroid.   Dispo:  Being discharged today.  Lorella Nimrod, MD 01/09/2017, 11:46 AM Pager: [phone]

## 2017-01-09 NOTE — Progress Notes (Signed)
Transitions of Care Pharmacy Note  Plan:  Educated on importance of taking insulin and monitoring blood sugar --------------------------------------------- Jared Murray is an 41 y.o. male who presents with a chief complaint of DKA. In anticipation of discharge, pharmacy has reviewed this patient's prior to admission medication history, as well as current inpatient medications listed per the [facility]  Current medication indications, dosing, frequency, and notable side effects reviewed with patient. patient verbalized understanding of current inpatient medication regimen and is aware that the After Visit Summary when presented, will represent the most accurate medication list at discharge.   Jared Murray expressed concerns that his insulin needles are no longer covered by his insurance. He said that the pharmacy is giving him the lowest cost brand possible.   Assessment: Understanding of regimen: fair Understanding of indications: fair Potential of compliance: fair Barriers to Obtaining Medications: No  Patient instructed to contact inpatient pharmacy team with further questions or concerns if needed.    Time spent preparing for discharge counseling: 15 minutes Time spent counseling patient: 10 minutes   Thank you for allowing pharmacy to be a part of this patient's care.  Demetrius Charity, PharmD Acute Care Pharmacy Resident  Pager: [phone] 01/09/2017

## 2017-01-09 NOTE — Progress Notes (Signed)
Hypoglycemic Event  CBG: 41  Treatment: Patient initially given 2 apple juices to drink but his blood glucose was 39 on the next check.  Patient was then given 4oz of regular coke.  Symptoms: Patient remained alert and oriented X4 during hypoglycemic episode but felt weak and stated " I can't think straight even to get these pants on"  Follow-up CBG: Time:1735 CBG Result:107  Possible Reasons for Event: Patient received his long acting insulin but ate poorly at lunch time.   Doctor Inda Castle notified.      Rosana Hoes, Nolene Bernheim

## 2017-01-09 NOTE — Care Management Note (Signed)
Case Management Note  Patient Details  Name: Jared Murray MRN: [id number] Date of Birth: [birth year]  Subjective/Objective:    From home ,presents with DKA, off drip.  For dc today, no needs.     PCP [name]             Action/Plan: NCM will follow for dc needs.  Expected Discharge Date:  01/09/17               Expected Discharge Plan:  Home/Self Care  In-House Referral:     Discharge planning Services  CM Consult  Post Acute Care Choice:    Choice offered to:     DME Arranged:    DME Agency:     HH Arranged:    HH Agency:     Status of Service:  Completed, signed off  If discussed at [employer] of Stay Meetings, dates discussed:    Additional Comments:  Zenon Mayo, RN 01/09/2017, 12:52 PM

## 2017-01-09 NOTE — Discharge Summary (Signed)
Name: Jared Murray MRN: [id number] DOB: [birth year] 41 y.o. PCP: [name], MD  Date of Admission: 01/08/2017  9:36 AM Date of Discharge: 01/09/2017 Attending Physician: Annia Belt, MD  Discharge Diagnosis: 1. DKA.  Active Problems:   DKA (diabetic ketoacidoses) [facility])   Discharge Medications: Allergies as of 01/09/2017      Reactions   Latex Itching, Rash   Penicillins Other (See Comments)   Childhood allergy Has patient had a PCN reaction causing immediate rash, facial/tongue/throat swelling, SOB or lightheadedness with hypotension: NO Has patient had a PCN reaction causing severe rash involving mucus membranes or skin necrosis:NO Has patient had a PCN reaction that required hospitalization NO Has patient had a PCN reaction occurring within the last 10 years: NO If all of the above answers are "NO", then may proceed with Cephalosporin use.      Medication List    TAKE these medications   accu-chek softclix lancets Use as instructed three times daily.   acetaminophen-codeine 300-30 MG tablet Commonly known as:  TYLENOL #3 TAKE 1 TABLET BY MOUTH EVERY 8 HOURS AS NEEDED FOR MODERATE PAIN   atorvastatin 20 MG tablet Commonly known as:  LIPITOR Take 1 tablet (20 mg total) by mouth daily.   clobetasol cream 0.05 % Commonly known as:  TEMOVATE APPLY EXTERNALLY TO THE AFFECTED AREA TWICE DAILY   gabapentin 300 MG capsule Commonly known as:  NEURONTIN Take 1 capsule (300 mg total) by mouth 2 (two) times daily.   glucose blood test strip Commonly known as:  ACCU-CHEK AVIVA PLUS USE TO CHECK BLOOD SUGAR FOUR TIMES DAILY BEFORE MEALS AND AT BEDTIME   hydrocortisone 2.5 % cream Apply 1 application topically 2 (two) times daily as needed.   insulin glargine 100 UNIT/ML injection Commonly known as:  LANTUS Inject 0.25 mLs (25 Units total) into the skin 2 (two) times daily.   Insulin Syringe-Needle U-100 29G X 1/2" 2 ML Misc Commonly known as:  B-D INS  SYRINGE 2CC/29GX1/2" Check Blood sugar TID and QHS   loperamide 2 MG capsule Commonly known as:  IMODIUM Take 1 capsule (2 mg total) by mouth as needed for diarrhea or loose stools.   meloxicam 7.5 MG tablet Commonly known as:  MOBIC Take 1 tablet (7.5 mg total) by mouth daily.   metoCLOPramide 10 MG tablet Commonly known as:  REGLAN Take 1 tablet (10 mg total) by mouth every 8 (eight) hours as needed for nausea.   NOVOLOG 100 UNIT/ML injection Generic drug:  insulin aspart Inject 0-12 Units into the skin 3 (three) times daily with meals. As per sliding scale   pantoprazole 40 MG tablet Commonly known as:  PROTONIX Take 1 tablet (40 mg total) by mouth daily.   sildenafil 50 MG tablet Commonly known as:  VIAGRA Take 1 tablet (50 mg total) by mouth daily as needed for erectile dysfunction. At least 24 hours between doses   traZODone 100 MG tablet Commonly known as:  DESYREL Take 1 tablet (100 mg total) by mouth at bedtime as needed for sleep.       Disposition and follow-up:   [name] was [facility] from [facility] in Good condition.  At the hospital follow up visit please address:  1.  His blood sugar and compliance with his insulin regimen. -Complete resolution of his diarrhea.  2.  Labs / imaging needed at time of follow-up: None  3.  Pending labs/ test needing follow-up: None  Follow-up Appointments: Follow-up  Information    [name], MD. Schedule an appointment as soon as possible for a visit.   Specialty:  Family Medicine Why:  To be seen within one week. Contact information: [place] [id number] [place] Hospital Course by problem list: Jared L [name] a 41 y.o.man with PMHx of uncontrolled type 1 diabetes mellitus, noncompliance, multiple hospital admissions for DKA, avascular necrosis of both hips, psoriasis came to with C/O N/V/D since this morning.He ran out of his  insulin Syringes 2 days ago.  DKA. Pt. With uncontrolled diabetes, A1c on 12/22/16 was 10.6., He was found to be in DKA. He was treated initially with insulin infusion-later switched to Lantus and NovoLog sliding scale ones Closed. Electrolyte was replaced as needed. He was really encouraged to be compliant with his insulin. We will need a continuous reminder from his PCP.  Diarrhea. Most likely viral and usually self-limited,  He  can use Imodium.  Avascular necrosis of both hips. According to his PCP notes surgery is on hold pending optimal glycemic control. He was on Tylenol No. 3 when necessary for pain. -Continue his home pain management.  Psoriasis. No acute exacerbation. -Continue home topical steroid.   Discharge Vitals:   BP 116/87 (BP Location: Right Arm)   Pulse 89   Temp 98.1 F (36.7 C) (Oral)   Resp 11   Ht 5\' 10"  (1.778 m)   Wt 112 lb 14 oz (51.2 kg)   SpO2 100%   BMI 16.20 kg/m   Gen. Well-developed man, in no acute distress. Lungs. Clear bilaterally. CV. Regular rate and rhythm. Abdomen. Soft, nontender, nondistended, bowel sounds hyperactive. Extremities. No edema, no cyanosis, pulses 2+ bilaterally.  Pertinent Labs, Studies, and Procedures:   Recent Labs Lab 01/08/17 1140 01/08/17 1918 01/09/17 0354  HGB 13.1 13.5 12.6*  HCT 38.2* 38.7* 36.8*  WBC 4.7 15.7* 10.3  PLT 279 252 280    Recent Labs Lab 01/08/17 1140 01/08/17 1918 01/08/17 2035 01/09/17 0354 01/09/17 1002  NA 138 140 142 140 137  K 4.1 3.3* 3.0* 3.9 3.8  CL 99* 108 109 108 109  CO2 18* 18* 19* 21* 19*  GLUCOSE 376* 219* 139* 97 112*  BUN 14 14 13 11 9   CREATININE 1.14 0.99 1.08 0.72 0.67  CALCIUM 9.2 8.9 9.0 8.7* 8.4*   CMP     Component Value Date/Time   NA 137 01/09/2017 1002   NA 138 01/01/2017 0847   K 3.8 01/09/2017 1002   CL 109 01/09/2017 1002   CO2 19 (L) 01/09/2017 1002   GLUCOSE 112 (H) 01/09/2017 1002   BUN 9 01/09/2017 1002   BUN 8 01/01/2017 0847    CREATININE 0.67 01/09/2017 1002   CREATININE 0.92 09/18/2016 1117   CALCIUM 8.4 (L) 01/09/2017 1002   PROT 6.9 01/08/2017 1140   PROT 7.0 01/01/2017 0847   ALBUMIN 4.5 01/08/2017 1140   ALBUMIN 4.8 01/01/2017 0847   AST 50 (H) 01/08/2017 1140   ALT 44 01/08/2017 1140   ALKPHOS 123 01/08/2017 1140   BILITOT 2.5 (H) 01/08/2017 1140   BILITOT 1.3 (H) 01/01/2017 0847   GFRNONAA >60 01/09/2017 1002   GFRNONAA >89 12/19/2015 0947   GFRAA >60 01/09/2017 1002   GFRAA >89 12/19/2015 0947   Urinalysis    Component Value Date/Time   COLORURINE STRAW (A) 01/08/2017 0947   APPEARANCEUR CLEAR 01/08/2017 0947   LABSPEC 1.027 01/08/2017 6720  PHURINE 5.0 01/08/2017 0947   GLUCOSEU >=500 (A) 01/08/2017 0947   HGBUR NEGATIVE 01/08/2017 [place] 01/08/2017 0947   BILIRUBINUR neg 03/16/2015 1230   KETONESUR 80 (A) 01/08/2017 0947   PROTEINUR NEGATIVE 01/08/2017 0947   UROBILINOGEN 0.2 06/25/2015 0913   NITRITE NEGATIVE 01/08/2017 [place] 01/08/2017 0947     Discharge Instructions: Discharge Instructions    Diet - low sodium heart healthy    Complete by:  As directed    Increase activity slowly    Complete by:  As directed       Signed: Lorella Nimrod, MD 01/09/2017, 5:03 PM   Pager: [phone]

## 2017-01-16 ENCOUNTER — Ambulatory Visit: Payer: Medicaid Other | Attending: Family Medicine

## 2017-01-19 MED FILL — !VIAGRA 50 MG TABLET: 50 | 30 days supply | Qty: 3 | Fill #1

## 2017-01-29 ENCOUNTER — Other Ambulatory Visit: Payer: Self-pay | Admitting: Family Medicine

## 2017-01-29 DIAGNOSIS — E1065 Type 1 diabetes mellitus with hyperglycemia: Secondary | ICD-10-CM

## 2017-01-29 MED ORDER — INSULIN GLARGINE 100 UNIT/ML ~~LOC~~ SOLN: 25.0000 [IU] | Freq: Two times a day (BID) | SUBCUTANEOUS | 5 refills | Status: DC

## 2017-02-09 MED FILL — ACETAMINOPHEN/COD #3 TABLET: 300-30 | 20 days supply | Qty: 60 | Fill #1

## 2017-02-15 ENCOUNTER — Other Ambulatory Visit: Payer: Self-pay | Admitting: Family Medicine

## 2017-02-15 DIAGNOSIS — E1029 Type 1 diabetes mellitus with other diabetic kidney complication: Secondary | ICD-10-CM

## 2017-02-20 ENCOUNTER — Other Ambulatory Visit: Payer: Self-pay | Admitting: *Deleted

## 2017-02-20 MED ORDER — SILDENAFIL CITRATE 50 MG PO TABS: 50.0000 mg | ORAL_TABLET | Freq: Every day | ORAL | 3 refills | Status: DC | PRN

## 2017-02-20 NOTE — Telephone Encounter (Signed)
PRINTED FOR PASS PROGRAM 

## 2017-03-20 ENCOUNTER — Other Ambulatory Visit: Payer: Self-pay | Admitting: Family Medicine

## 2017-03-20 DIAGNOSIS — E1029 Type 1 diabetes mellitus with other diabetic kidney complication: Secondary | ICD-10-CM

## 2017-03-20 DIAGNOSIS — E1065 Type 1 diabetes mellitus with hyperglycemia: Secondary | ICD-10-CM

## 2017-03-20 MED ORDER — NOVOLOG 100 UNIT/ML ~~LOC~~ SOLN: 0.0000 [IU] | Freq: Three times a day (TID) | SUBCUTANEOUS | 0 refills | Status: DC

## 2017-03-26 ENCOUNTER — Ambulatory Visit: Payer: Medicaid Other | Admitting: Family Medicine

## 2017-04-01 ENCOUNTER — Ambulatory Visit: Payer: Medicaid Other | Attending: Family Medicine | Admitting: Family Medicine

## 2017-04-01 ENCOUNTER — Encounter: Payer: Self-pay | Admitting: Family Medicine

## 2017-04-01 VITALS — BP 105/70 | HR 110 | Temp 98.3°F | Ht 70.0 in | Wt 123.0 lb

## 2017-04-01 DIAGNOSIS — Z880 Allergy status to penicillin: Secondary | ICD-10-CM | POA: Diagnosis not present

## 2017-04-01 DIAGNOSIS — E1065 Type 1 diabetes mellitus with hyperglycemia: Secondary | ICD-10-CM

## 2017-04-01 DIAGNOSIS — E1010 Type 1 diabetes mellitus with ketoacidosis without coma: Secondary | ICD-10-CM

## 2017-04-01 DIAGNOSIS — Z794 Long term (current) use of insulin: Secondary | ICD-10-CM | POA: Insufficient documentation

## 2017-04-01 DIAGNOSIS — M170 Bilateral primary osteoarthritis of knee: Secondary | ICD-10-CM | POA: Diagnosis not present

## 2017-04-01 DIAGNOSIS — K219 Gastro-esophageal reflux disease without esophagitis: Secondary | ICD-10-CM | POA: Diagnosis not present

## 2017-04-01 DIAGNOSIS — E1110 Type 2 diabetes mellitus with ketoacidosis without coma: Secondary | ICD-10-CM | POA: Diagnosis present

## 2017-04-01 DIAGNOSIS — Z91199 Patient's noncompliance with other medical treatment and regimen due to unspecified reason: Secondary | ICD-10-CM

## 2017-04-01 DIAGNOSIS — Z91040 Latex allergy status: Secondary | ICD-10-CM | POA: Diagnosis not present

## 2017-04-01 DIAGNOSIS — Z9119 Patient's noncompliance with other medical treatment and regimen: Secondary | ICD-10-CM

## 2017-04-01 DIAGNOSIS — Z9889 Other specified postprocedural states: Secondary | ICD-10-CM | POA: Diagnosis not present

## 2017-04-01 DIAGNOSIS — M87059 Idiopathic aseptic necrosis of unspecified femur: Secondary | ICD-10-CM

## 2017-04-01 DIAGNOSIS — Z9114 Patient's other noncompliance with medication regimen: Secondary | ICD-10-CM | POA: Insufficient documentation

## 2017-04-01 DIAGNOSIS — L409 Psoriasis, unspecified: Secondary | ICD-10-CM | POA: Diagnosis not present

## 2017-04-01 LAB — GLUCOSE, POCT (MANUAL RESULT ENTRY): POC Glucose: 180 mg/dl — AB (ref 70–99)

## 2017-04-01 LAB — POCT GLYCOSYLATED HEMOGLOBIN (HGB A1C): Hemoglobin A1C: 10.1

## 2017-04-01 MED ORDER — ACETAMINOPHEN-CODEINE #3 300-30 MG PO TABS: ORAL_TABLET | ORAL | 1 refills | Status: DC

## 2017-04-01 MED ORDER — INSULIN GLARGINE 100 UNIT/ML ~~LOC~~ SOLN: 40.0000 [IU] | Freq: Every day | SUBCUTANEOUS | 5 refills | Status: DC

## 2017-04-01 NOTE — Progress Notes (Signed)
Subjective:    Patient ID: Jared Murray, male    DOB: 1976/01/01, 41 y.o.   MRN: [id number]  HPI He is a 41 year old male with a history of uncontrolled type 1 diabetes mellitus (A1c 10.1 from today), noncompliance, multiple hospital admissions for DKA, avascular necrosis of both hips, psoriasis who comes into the clinic for a follow-up visit.  Referred to endocrine and he missed his appointment with [facility] endocrinology on 01/28/17; he was also referred to ophthalmology but never made it. He is requesting a referral placed to [facility] as he shuttles between [place] and [place]. He has not been compliant with his Lantus as he sometimes forgets to take it. Compliant with a diabetic diet has also been a challenge as he lives with his brother who eats out most of the time and eats unhealthy and he has to rely on whatever foods are available at home to cook. Not up to date on eye exams; last eye exam was last year.  His surgery for avascular necrosis has been on hold pending optimal control of his diabetes and he continues to complain of pain in his hip.  He has osteoarthritis in both knees and reports improvement in his symptoms.  Past Medical History:  Diagnosis Date  . DKA (diabetic ketoacidoses) ([place])   . Eczema   . GERD (gastroesophageal reflux disease)   . Psoriasis   . Substance abuse   . Type 1 diabetes mellitus ([place])     Past Surgical History:  Procedure Laterality Date  . FINGER SURGERY      Allergies  Allergen Reactions  . Latex Itching and Rash  . Penicillins Other (See Comments)    Childhood allergy Has patient had a PCN reaction causing immediate rash, facial/tongue/throat swelling, SOB or lightheadedness with hypotension: NO Has patient had a PCN reaction causing severe rash involving mucus membranes or skin necrosis:NO Has patient had a PCN reaction that required hospitalization NO Has patient had a PCN reaction occurring within the last 10 years:  NO If all of the above answers are "NO", then may proceed with Cephalosporin use.    Current Outpatient Prescriptions on File Prior to Visit  Medication Sig Dispense Refill  . ACCU-CHEK SOFTCLIX LANCETS lancets USE AS INSTRUCTED THREE TIMES DAILY 100 each 5  . clobetasol cream (TEMOVATE) 0.05 % APPLY EXTERNALLY TO THE AFFECTED AREA TWICE DAILY 30 g 1  . glucose blood (ACCU-CHEK AVIVA PLUS) test strip USE TO CHECK BLOOD SUGAR FOUR TIMES DAILY BEFORE MEALS AND AT BEDTIME 120 each 5  . hydrocortisone 2.5 % cream Apply 1 application topically 2 (two) times daily as needed.     . Insulin Syringe-Needle U-100 (B-D INS SYRINGE 2CC/29GX1/2") 29G X 1/2" 2 ML MISC Check Blood sugar TID and QHS 100 each 12  . loperamide (IMODIUM) 2 MG capsule Take 1 capsule (2 mg total) by mouth as needed for diarrhea or loose stools. 30 capsule 0  . meloxicam (MOBIC) 7.5 MG tablet Take 1 tablet (7.5 mg total) by mouth daily. 30 tablet 2  . metoCLOPramide (REGLAN) 10 MG tablet Take 1 tablet (10 mg total) by mouth every 8 (eight) hours as needed for nausea. 60 tablet 1  . NOVOLOG 100 UNIT/ML injection Inject 0-12 Units into the skin 3 (three) times daily with meals. As per sliding scale 1 vial 0  . pantoprazole (PROTONIX) 40 MG tablet Take 1 tablet (40 mg total) by mouth daily. 30 tablet 5  . sildenafil (VIAGRA)  50 MG tablet Take 1 tablet (50 mg total) by mouth daily as needed for erectile dysfunction. At least 24 hours between doses 30 tablet 3  . traZODone (DESYREL) 100 MG tablet Take 1 tablet (100 mg total) by mouth at bedtime as needed for sleep. 30 tablet 3  . atorvastatin (LIPITOR) 20 MG tablet Take 1 tablet (20 mg total) by mouth daily. (Patient not taking: Reported on 04/01/2017) 30 tablet 3  . gabapentin (NEURONTIN) 300 MG capsule Take 1 capsule (300 mg total) by mouth 2 (two) times daily. (Patient not taking: Reported on 04/01/2017) 60 capsule 3   No current facility-administered medications on file prior to visit.        Review of Systems Constitutional: Negative for activity change and appetite change.  HENT: Negative for sinus pressure and sore throat.   Eyes: Negative for visual disturbance.  Respiratory: Negative for cough, chest tightness and shortness of breath.   Cardiovascular: Negative for chest pain and leg swelling.  Gastrointestinal: see hpi  Endocrine: Negative.   Genitourinary: Negative for dysuria.  Musculoskeletal: Negative for myalgias and joint swelling.       Bilateral hip pain  Skin: Positive for rash and skin discoloration Allergic/Immunologic: Negative.   Neurological: Negative for weakness, light-headedness and numbness.  Psychiatric/Behavioral: Negative for suicidal ideas and positive for dysphoric mood.    Objective: Vitals:   04/01/17 1437  BP: 105/70  Pulse: (!) 110  Temp: 98.3 F (36.8 C)  TempSrc: Oral  SpO2: 98%  Weight: 123 lb (55.8 kg)  Height: 5\' 10"  (1.778 m)      Physical Exam Constitutional: He is oriented to person, place, and time. He appears well-developed and well-nourished.  Cardiovascular: Tachycardic heart rate, normal heart sounds and intact distal pulses.    No murmur heard. Pulmonary/Chest: Effort normal and breath sounds normal. He has no wheezes. He has no rales. He exhibits no tenderness.  Abdominal: Soft. Bowel sounds are normal. He exhibits no distension and no mass. There is no tenderness.  Musculoskeletal: mild tenderness on ROM of hips , crepitus in both knees on ROM Neurological: He is alert and oriented to person, place, and time.  Skin:  Hypopigmented patches on dorsum of both hands, psoriatic lesions diffusely distributed on body surface.  Psychiatric:  Normal mood       Assessment & Plan:  1. Type 1 diabetes mellitus with hyperglycemia (HCC) Uncontrolled with A1c of 10.1  Poor control largely secondary to underlying stress and patient forgetting to take his medications. Switch from twice-daily dose of Lantus to daily  dosing to facilitate compliance Continue NovoLog sliding scale Emphasized compliance with medications and diet - POCT glucose (manual entry) - POCT glycosylated hemoglobin (Hb A1C)  2. Avascular necrosis of bone of hip, unspecified laterality ([place]) Surgery on hold pending optimal glycemic control - acetaminophen-codeine (TYLENOL #3) 300-30 MG tablet; TAKE 1 TABLET BY MOUTH EVERY 8 HOURS AS NEEDED FOR MODERATE PAIN  Dispense: 60 tablet; Refill: 1  3. Psoriasis Improving Continue topical steroid  4. Osteoarthritis of both knees, unspecified osteoarthritis type On meloxicam  This note has been created with Surveyor, quantity. Any transcriptional errors are unintentional.

## 2017-04-12 DIAGNOSIS — F32A Depression, unspecified: Secondary | ICD-10-CM | POA: Insufficient documentation

## 2017-04-28 DIAGNOSIS — L80 Vitiligo: Secondary | ICD-10-CM | POA: Insufficient documentation

## 2017-06-17 ENCOUNTER — Telehealth: Payer: Self-pay | Admitting: Family Medicine

## 2017-06-17 DIAGNOSIS — L409 Psoriasis, unspecified: Secondary | ICD-10-CM

## 2017-06-17 DIAGNOSIS — E108 Type 1 diabetes mellitus with unspecified complications: Secondary | ICD-10-CM

## 2017-06-17 DIAGNOSIS — E1065 Type 1 diabetes mellitus with hyperglycemia: Secondary | ICD-10-CM

## 2017-06-17 MED ORDER — INSULIN GLARGINE 100 UNIT/ML ~~LOC~~ SOLN: 40.0000 [IU] | Freq: Every day | SUBCUTANEOUS | 0 refills | Status: DC

## 2017-06-17 MED ORDER — GLUCOSE BLOOD VI STRP: ORAL_STRIP | 5 refills | Status: DC

## 2017-06-17 MED ORDER — CLOBETASOL PROPIONATE 0.05 % EX CREA: TOPICAL_CREAM | CUTANEOUS | 0 refills | Status: DC

## 2017-06-17 MED ORDER — NOVOLOG 100 UNIT/ML ~~LOC~~ SOLN: 0.0000 [IU] | Freq: Three times a day (TID) | SUBCUTANEOUS | 0 refills | Status: DC

## 2017-06-17 NOTE — Telephone Encounter (Signed)
Pt called requesting med refill on insulin glargine (LANTUS) 100 UNIT/ML injection,glucose blood (ACCU-CHEK AVIVA PLUS) test strip, clobetasol cream (TEMOVATE) 0.05 %,NOVOLOG 100 UNIT/ML injection.  Please f/up

## 2017-06-17 NOTE — Telephone Encounter (Signed)
Refilled non-controlled substances

## 2017-06-17 NOTE — Telephone Encounter (Signed)
Pt called requesting med refill on acetaminophen-codeine (TYLENOL #3) 300-30 MG tablet

## 2017-06-18 ENCOUNTER — Other Ambulatory Visit: Payer: Self-pay | Admitting: Family Medicine

## 2017-06-18 DIAGNOSIS — L309 Dermatitis, unspecified: Secondary | ICD-10-CM

## 2017-06-18 DIAGNOSIS — E1065 Type 1 diabetes mellitus with hyperglycemia: Secondary | ICD-10-CM

## 2017-06-18 MED ORDER — INSULIN GLARGINE 100 UNIT/ML ~~LOC~~ SOLN: 40.0000 [IU] | Freq: Every day | SUBCUTANEOUS | 0 refills | Status: DC

## 2017-06-18 MED ORDER — ACETAMINOPHEN-CODEINE #3 300-30 MG PO TABS: 1.0000 | ORAL_TABLET | Freq: Three times a day (TID) | ORAL | 0 refills | Status: DC | PRN

## 2017-06-18 NOTE — Addendum Note (Signed)
Addended by: Karle Plumber B on: 06/18/2017 01:28 PM   Modules accepted: Orders

## 2017-06-18 NOTE — Telephone Encounter (Signed)
Patient requested refill on Tylenol No. 3. Dr. Johna Sheriff note from 03/2017 reviewed. [facility] reviewed. RF given. Tylenol #3 Q 8 hrs PRN #[id number].

## 2017-06-19 ENCOUNTER — Encounter: Payer: Self-pay | Admitting: Pharmacist

## 2017-06-19 ENCOUNTER — Other Ambulatory Visit: Payer: Self-pay | Admitting: Family Medicine

## 2017-06-19 NOTE — Telephone Encounter (Signed)
Pt called to check on the statu of the Pre-auth for his Tylenol #3 since he has medicaid, please follow up

## 2017-06-19 NOTE — Progress Notes (Signed)
Prior authorization completed for Tylenol #3 - approval #[id number] and approved x 180 days

## 2017-06-23 ENCOUNTER — Other Ambulatory Visit: Payer: Self-pay | Admitting: Pharmacist

## 2017-06-23 DIAGNOSIS — E1065 Type 1 diabetes mellitus with hyperglycemia: Secondary | ICD-10-CM

## 2017-06-23 MED ORDER — NOVOLOG 100 UNIT/ML ~~LOC~~ SOLN: SUBCUTANEOUS | 0 refills | Status: DC

## 2017-08-01 ENCOUNTER — Other Ambulatory Visit: Payer: Self-pay

## 2017-08-01 ENCOUNTER — Inpatient Hospital Stay (HOSPITAL_COMMUNITY): Payer: Medicaid Other

## 2017-08-01 ENCOUNTER — Encounter (HOSPITAL_COMMUNITY): Payer: Self-pay | Admitting: *Deleted

## 2017-08-01 ENCOUNTER — Observation Stay (HOSPITAL_COMMUNITY)
Admission: EM | Admit: 2017-08-01 | Discharge: 2017-08-02 | Disposition: A | Discharge status: Home / Self Care | Payer: Medicaid Other | Attending: Family Medicine | Admitting: Family Medicine

## 2017-08-01 DIAGNOSIS — L409 Psoriasis, unspecified: Secondary | ICD-10-CM | POA: Diagnosis not present

## 2017-08-01 DIAGNOSIS — F17210 Nicotine dependence, cigarettes, uncomplicated: Secondary | ICD-10-CM | POA: Insufficient documentation

## 2017-08-01 DIAGNOSIS — Z794 Long term (current) use of insulin: Secondary | ICD-10-CM | POA: Diagnosis not present

## 2017-08-01 DIAGNOSIS — E1065 Type 1 diabetes mellitus with hyperglycemia: Secondary | ICD-10-CM | POA: Diagnosis not present

## 2017-08-01 DIAGNOSIS — R1112 Projectile vomiting: Secondary | ICD-10-CM

## 2017-08-01 DIAGNOSIS — R112 Nausea with vomiting, unspecified: Secondary | ICD-10-CM | POA: Insufficient documentation

## 2017-08-01 DIAGNOSIS — K529 Noninfective gastroenteritis and colitis, unspecified: Secondary | ICD-10-CM | POA: Diagnosis not present

## 2017-08-01 DIAGNOSIS — E876 Hypokalemia: Secondary | ICD-10-CM | POA: Diagnosis not present

## 2017-08-01 DIAGNOSIS — Z79899 Other long term (current) drug therapy: Secondary | ICD-10-CM | POA: Diagnosis not present

## 2017-08-01 DIAGNOSIS — R1110 Vomiting, unspecified: Secondary | ICD-10-CM | POA: Diagnosis present

## 2017-08-01 DIAGNOSIS — E1010 Type 1 diabetes mellitus with ketoacidosis without coma: Secondary | ICD-10-CM | POA: Diagnosis not present

## 2017-08-01 DIAGNOSIS — E860 Dehydration: Secondary | ICD-10-CM | POA: Diagnosis not present

## 2017-08-01 DIAGNOSIS — M879 Osteonecrosis, unspecified: Secondary | ICD-10-CM | POA: Insufficient documentation

## 2017-08-01 DIAGNOSIS — K219 Gastro-esophageal reflux disease without esophagitis: Secondary | ICD-10-CM | POA: Diagnosis not present

## 2017-08-01 DIAGNOSIS — Z791 Long term (current) use of non-steroidal anti-inflammatories (NSAID): Secondary | ICD-10-CM | POA: Insufficient documentation

## 2017-08-01 DIAGNOSIS — E1310 Other specified diabetes mellitus with ketoacidosis without coma: Secondary | ICD-10-CM

## 2017-08-01 LAB — COMPREHENSIVE METABOLIC PANEL
ALBUMIN: 4.8 g/dL (ref 3.5–5.0)
ALT: 43 U/L (ref 17–63)
ANION GAP: 20 — AB (ref 5–15)
AST: 35 U/L (ref 15–41)
Alkaline Phosphatase: 137 U/L — ABNORMAL HIGH (ref 38–126)
BUN: 19 mg/dL (ref 6–20)
CHLORIDE: 84 mmol/L — AB (ref 101–111)
CO2: 33 mmol/L — ABNORMAL HIGH (ref 22–32)
Calcium: 9.5 mg/dL (ref 8.9–10.3)
Creatinine, Ser: 1.04 mg/dL (ref 0.61–1.24)
GFR calc Af Amer: >60 mL/min (ref >60)
GFR calc non Af Amer: >60 mL/min (ref >60)
GLUCOSE: 286 mg/dL — AB (ref 65–99)
POTASSIUM: 3.8 mmol/L (ref 3.5–5.1)
Sodium: 137 mmol/L (ref 135–145)
TOTAL PROTEIN: 8.1 g/dL (ref 6.5–8.1)
Total Bilirubin: 2.3 mg/dL — ABNORMAL HIGH (ref 0.3–1.2)

## 2017-08-01 LAB — CBC
HEMATOCRIT: 40.3 % (ref 39.0–52.0)
HEMOGLOBIN: 14 g/dL (ref 13.0–17.0)
MCH: 30.6 pg (ref 26.0–34.0)
MCHC: 34.7 g/dL (ref 30.0–36.0)
MCV: 88.2 fL (ref 78.0–100.0)
Platelets: 416 10*3/uL — ABNORMAL HIGH (ref 150–400)
RBC: 4.57 MIL/uL (ref 4.22–5.81)
RDW: 12.7 % (ref 11.5–15.5)
WBC: 6.6 10*3/uL (ref 4.0–10.5)

## 2017-08-01 LAB — URINALYSIS, ROUTINE W REFLEX MICROSCOPIC
Glucose, UA: 150 mg/dL — AB
HGB URINE DIPSTICK: NEGATIVE
KETONES UR: 80 mg/dL — AB
LEUKOCYTES UA: NEGATIVE
NITRITE: NEGATIVE
SPECIFIC GRAVITY, URINE: 1.026 (ref 1.005–1.030)
pH: 9 — ABNORMAL HIGH (ref 5.0–8.0)

## 2017-08-01 LAB — I-STAT CG4 LACTIC ACID, ED: LACTIC ACID, VENOUS: 2.43 mmol/L — AB (ref 0.5–1.9)

## 2017-08-01 LAB — BLOOD GAS, ARTERIAL
ACID-BASE EXCESS: 9.8 mmol/L — AB (ref 0.0–2.0)
Bicarbonate: 34.5 mmol/L — ABNORMAL HIGH (ref 20.0–28.0)
DRAWN BY: 295031
FIO2: 21
O2 Saturation: 90.4 %
PCO2 ART: 46.9 mmHg (ref 32.0–48.0)
PH ART: 7.479 — AB (ref 7.350–7.450)
Patient temperature: 98.6
pO2, Arterial: 60.6 mmHg — ABNORMAL LOW (ref 83.0–108.0)

## 2017-08-01 LAB — MAGNESIUM: MAGNESIUM: 1.9 mg/dL (ref 1.7–2.4)

## 2017-08-01 LAB — I-STAT TROPONIN, ED: Troponin i, poc: 0 ng/mL (ref 0.00–0.08)

## 2017-08-01 LAB — CBG MONITORING, ED
GLUCOSE-CAPILLARY: 272 mg/dL — AB (ref 65–99)
Glucose-Capillary: 246 mg/dL — ABNORMAL HIGH (ref 65–99)

## 2017-08-01 LAB — GLUCOSE, CAPILLARY
GLUCOSE-CAPILLARY: 216 mg/dL — AB (ref 65–99)
GLUCOSE-CAPILLARY: 164 mg/dL — AB (ref 65–99)
GLUCOSE-CAPILLARY: 112 mg/dL — AB (ref 65–99)

## 2017-08-01 LAB — PHOSPHORUS: Phosphorus: 3 mg/dL (ref 2.5–4.6)

## 2017-08-01 LAB — LIPASE, BLOOD: Lipase: 15 U/L (ref 11–51)

## 2017-08-01 LAB — ETHANOL

## 2017-08-01 MED ORDER — SODIUM CHLORIDE 0.9 % IV SOLN
INTRAVENOUS | Status: DC
  Administered 2017-08-01: 1.9 [IU]/h via INTRAVENOUS
  Filled 2017-08-01: qty 1

## 2017-08-01 MED ORDER — DEXTROSE-NACL 5-0.45 % IV SOLN: INTRAVENOUS | Status: DC

## 2017-08-01 MED ORDER — SODIUM CHLORIDE 0.9 % IV BOLUS (SEPSIS)
1000.0000 mL | Freq: Once | INTRAVENOUS | Status: AC
  Administered 2017-08-01: 1000 mL via INTRAVENOUS

## 2017-08-01 MED ORDER — POTASSIUM CHLORIDE IN NACL 20-0.9 MEQ/L-% IV SOLN
INTRAVENOUS | Status: DC
  Administered 2017-08-02 (×2): via INTRAVENOUS
  Filled 2017-08-01 (×3): qty 1000

## 2017-08-01 MED ORDER — ENOXAPARIN SODIUM 40 MG/0.4ML ~~LOC~~ SOLN
40.0000 mg | SUBCUTANEOUS | Status: DC
  Administered 2017-08-02: 40 mg via SUBCUTANEOUS

## 2017-08-01 MED ORDER — SODIUM CHLORIDE 0.9 % IV SOLN
INTRAVENOUS | Status: DC
  Filled 2017-08-01: qty 1

## 2017-08-01 MED ORDER — ALUM & MAG HYDROXIDE-SIMETH 200-200-20 MG/5ML PO SUSP
15.0000 mL | Freq: Four times a day (QID) | ORAL | Status: DC | PRN
  Administered 2017-08-02: 15 mL via ORAL
  Filled 2017-08-01: qty 30

## 2017-08-01 MED ORDER — IOPAMIDOL (ISOVUE-300) INJECTION 61%
100.0000 mL | Freq: Once | INTRAVENOUS | Status: AC | PRN
  Administered 2017-08-01: 100 mL via INTRAVENOUS

## 2017-08-01 MED ORDER — ONDANSETRON HCL 4 MG/2ML IJ SOLN
4.0000 mg | Freq: Once | INTRAMUSCULAR | Status: AC
  Administered 2017-08-01: 4 mg via INTRAVENOUS
  Filled 2017-08-01: qty 2

## 2017-08-01 MED ORDER — INSULIN GLARGINE 100 UNIT/ML ~~LOC~~ SOLN
40.0000 [IU] | Freq: Every day | SUBCUTANEOUS | Status: DC
  Filled 2017-08-01: qty 0.4

## 2017-08-01 MED ORDER — INSULIN ASPART 100 UNIT/ML ~~LOC~~ SOLN
3.0000 [IU] | Freq: Three times a day (TID) | SUBCUTANEOUS | Status: DC
  Administered 2017-08-02 (×3): 3 [IU] via SUBCUTANEOUS

## 2017-08-01 MED ORDER — ENSURE ENLIVE PO LIQD
237.0000 mL | Freq: Two times a day (BID) | ORAL | Status: DC
  Administered 2017-08-02 (×2): 237 mL via ORAL

## 2017-08-01 MED ORDER — IOPAMIDOL (ISOVUE-300) INJECTION 61%
INTRAVENOUS | Status: AC
  Filled 2017-08-01: qty 30

## 2017-08-01 MED ORDER — FAMOTIDINE IN NACL 20-0.9 MG/50ML-% IV SOLN
20.0000 mg | Freq: Once | INTRAVENOUS | Status: AC
  Administered 2017-08-01: 20 mg via INTRAVENOUS
  Filled 2017-08-01: qty 50

## 2017-08-01 MED ORDER — IOPAMIDOL (ISOVUE-300) INJECTION 61%
INTRAVENOUS | Status: AC
  Filled 2017-08-01: qty 100

## 2017-08-01 MED ORDER — IOPAMIDOL (ISOVUE-300) INJECTION 61%: 30.0000 mL | Freq: Once | INTRAVENOUS | Status: DC

## 2017-08-01 MED ORDER — INSULIN ASPART 100 UNIT/ML ~~LOC~~ SOLN: 1.0000 [IU] | Freq: Three times a day (TID) | SUBCUTANEOUS | Status: DC

## 2017-08-01 MED ORDER — SODIUM CHLORIDE 0.9 % IV SOLN: INTRAVENOUS | Status: DC

## 2017-08-01 MED ORDER — INSULIN ASPART 100 UNIT/ML ~~LOC~~ SOLN
0.0000 [IU] | SUBCUTANEOUS | Status: DC
  Administered 2017-08-02: 4 [IU] via SUBCUTANEOUS
  Administered 2017-08-02: 8 [IU] via SUBCUTANEOUS

## 2017-08-01 NOTE — Plan of Care (Signed)
  Progressing Education: Knowledge of General Education information will improve 08/01/2017 1815 - Progressing by Yona Kosek, Royetta Crochet, RN

## 2017-08-01 NOTE — ED Notes (Signed)
Bed: BO17 Expected date:  Expected time:  Means of arrival:  Comments: 41 yo N/V x4 days

## 2017-08-01 NOTE — ED Provider Notes (Signed)
[place] DEPT Provider Note   CSN: [id number] Arrival date & time: 08/01/17  1244     History   Chief Complaint Chief Complaint  Patient presents with  . Emesis  . Nausea  . Diarrhea    HPI Jared Murray is a 41 y.o. male with a PMHx of DM1, GERD, and other conditions listed below, who presents to the ED with complaints of nausea and vomiting x 2 days.  Patient has had this issue in the past, has been admitted multiple times for similar symptoms, chart review reveals that he was last admitted here on 01/08/17 for n/v/d found to be in DKA; on 04/12/17 he was admitted at [facility] for n/v/d but not in DKA at that time; was given GI referral at that time however he has not yet followed up.  He states that 2 days ago he began having approximately 10 episodes daily of nonbloody nonbilious emesis, with associated lightheadedness and "acid reflux" and belching.  He has not tried anything for his symptoms, no known aggravating factors.  He continues to have diarrhea which has been chronic for the last 6 months, approximately 15 episodes daily of nonbloody occasionally loose and occasionally watery diarrhea.  This has not changed recently.  He states that his grandkids were sick with a similar illness last week.  He also mentions that he had a mixed drink on Tuesday 4 days ago "accidentally" (his friends had a drink that he tried, but didn't know it had EtOH in it until after he had a sip); he states otherwise he had stopped drinking EtOH. His PCP is at [facility].   He denies fevers, chills, CP, SOB, cough, URI symptoms, abd pain, constipation, obstipation, melena, hematochezia, hematemesis, hematuria, dysuria, testicular pain/swelling, penile discharge, myalgias, arthralgias, numbness, tingling, focal weakness, or any other complaints at this time. Denies recent travel, suspicious food intake, NSAID use, or prior abd surgeries.    The history is provided by the patient and  medical records. No language interpreter was used.  Emesis   This is a recurrent problem. The current episode started 2 days ago. The problem occurs 5 to 10 times per day. The problem has not changed since onset.The emesis has an appearance of stomach contents. There has been no fever. Associated symptoms include diarrhea (chronic unchanged). Pertinent negatives include no abdominal pain, no arthralgias, no chills, no cough, no fever, no myalgias and no URI. Risk factors include ill contacts.  Diarrhea   Associated symptoms include vomiting. Pertinent negatives include no abdominal pain, no chills, no arthralgias, no myalgias, no URI and no cough.    Past Medical History:  Diagnosis Date  . DKA (diabetic ketoacidoses) ([place])   . Eczema   . GERD (gastroesophageal reflux disease)   . Psoriasis   . Substance abuse   . Type 1 diabetes mellitus [facility])     Patient Active Problem List   Diagnosis Date Noted  . Osteoarthritis 12/24/2016  . Hyperbilirubinemia   . Non compliance w medication regimen 01/07/2016  . Generalized anxiety disorder 11/21/2015  . Non compliance with medical treatment 11/21/2015  . DKA (diabetic ketoacidoses) ([place]) 11/02/2015  . Leukocytosis 11/02/2015  . Tobacco use disorder 09/13/2015  . Nausea & vomiting 08/23/2015  . Type 1 diabetes mellitus with hyperglycemia ([place])   . Diabetic ketoacidosis without coma associated with type 1 diabetes mellitus ([place])   . SIRS (systemic inflammatory response syndrome) ([facility])   . Avascular necrosis of bone of hip ([place])   .  DKA (diabetic ketoacidosis) ([place]) 05/14/2015  . Diabetic ketoacidosis associated with other specified diabetes mellitus   . Lactic acidosis   . Cutaneous abscess of chest wall   . Diabetes type 1, uncontrolled ([place])   . DKA, type 1, not at goal [facility]) 04/03/2015  . Chest wall abscess 04/03/2015  . Avascular necrosis ([place]) 02/12/2015  . Type 1 diabetes mellitus ([place]) 01/29/2015  . Sepsis ([place]) 01/22/2015  .  GERD (gastroesophageal reflux disease) 01/22/2015  . AKI (acute kidney injury) ([place]) 01/22/2015  . Hyperkalemia   . Viral gastroenteritis 07/01/2014  . Left hip pain 04/18/2014  . Dehydration 01/23/2014  . Psoriasis 07/01/2013  . Loss of weight 03/17/2013  . Hypokalemia 03/17/2013  . Eczema 03/17/2013  . Protein-calorie malnutrition, severe ([place]) 03/17/2013    Past Surgical History:  Procedure Laterality Date  . FINGER SURGERY         Home Medications    Prior to Admission medications   Medication Sig Start Date End Date Taking? Authorizing Provider  ACCU-CHEK SOFTCLIX LANCETS lancets USE AS INSTRUCTED THREE TIMES DAILY 02/16/17   [name], MD  acetaminophen-codeine (TYLENOL #3) 300-30 MG tablet TAKE 1 TABLET BY MOUTH EVERY 8 HOURS AS NEEDED FOR MODERATE PAIN 04/01/17   [name], MD  acetaminophen-codeine (TYLENOL #3) 300-30 MG tablet Take 1 tablet by mouth every 8 (eight) hours as needed for moderate pain. 06/18/17   Ladell Pier, MD  atorvastatin (LIPITOR) 20 MG tablet Take 1 tablet (20 mg total) by mouth daily. Patient not taking: Reported on 04/01/2017 12/24/16   [name], MD  clobetasol cream (TEMOVATE) 0.05 % APPLY EXTERNALLY TO THE AFFECTED AREA TWICE DAILY 06/17/17   Angelica Chessman E, MD  clobetasol cream (TEMOVATE) 0.05 % APPLY EXTERNALLY TO THE AFFECTED AREA TWICE DAILY 06/18/17   Tresa Garter, MD  gabapentin (NEURONTIN) 300 MG capsule Take 1 capsule (300 mg total) by mouth 2 (two) times daily. Patient not taking: Reported on 04/01/2017 11/11/16   [name], MD  glucose blood (ACCU-CHEK AVIVA PLUS) test strip USE TO CHECK BLOOD SUGAR FOUR TIMES DAILY BEFORE MEALS AND AT BEDTIME 06/17/17   Jegede, Olugbemiga E, MD  hydrocortisone 2.5 % cream Apply 1 application topically 2 (two) times daily as needed.     [provider]  insulin glargine (LANTUS) 100 UNIT/ML injection Inject 0.4 mLs (40 Units total) into the skin at bedtime. 06/18/17    Tresa Garter, MD  Insulin Syringe-Needle U-100 (B-D INS SYRINGE 2CC/29GX1/2") 29G X 1/2" 2 ML MISC Check Blood sugar TID and QHS 03/22/14   Jegede, Olugbemiga E, MD  loperamide (IMODIUM) 2 MG capsule Take 1 capsule (2 mg total) by mouth as needed for diarrhea or loose stools. 01/09/17   Lorella Nimrod, MD  meloxicam (MOBIC) 7.5 MG tablet Take 1 tablet (7.5 mg total) by mouth daily. 12/24/16   [name], MD  metoCLOPramide (REGLAN) 10 MG tablet Take 1 tablet (10 mg total) by mouth every 8 (eight) hours as needed for nausea. 11/11/16   [name], MD  NOVOLOG 100 UNIT/ML injection INJECT 0 TO 12 UNITS INTO THE SKIN THREE TIMES DAILY WITH MEALS AS PER SLIDING SCALE 06/23/17   Jegede, Olugbemiga E, MD  pantoprazole (PROTONIX) 40 MG tablet Take 1 tablet (40 mg total) by mouth daily. 11/11/16   [name], MD  sildenafil (VIAGRA) 50 MG tablet Take 1 tablet (50 mg total) by mouth daily as needed for erectile dysfunction. At least 24 hours between doses 02/20/17  [name], MD  traZODone (DESYREL) 100 MG tablet Take 1 tablet (100 mg total) by mouth at bedtime as needed for sleep. 09/18/16   [name], MD    Family History Family History  Problem Relation Age of Onset  . Hypertension Father   . Diabetes Unknown        multiple  . Lupus Cousin   . Stroke Maternal Grandmother   . Stroke Paternal Grandmother     Social History Social History   Tobacco Use  . Smoking status: Current Some Day Smoker    Packs/day: 0.25    Years: 10.00    Pack years: 2.50    Types: Cigarettes  . Smokeless tobacco: Never Used  Substance Use Topics  . Alcohol use: Yes    Alcohol/week: 0.6 - 1.2 oz    Types: 1 - 2 Shots of liquor per week    Comment: last time tuesday  . Drug use: Yes    Types: Marijuana    Comment: smoked about 2 weeks ago     Allergies   Latex and Penicillins   Review of Systems Review of Systems  Constitutional: Negative for chills and fever.  HENT: Negative for  rhinorrhea.   Respiratory: Negative for cough and shortness of breath.   Cardiovascular: Negative for chest pain.  Gastrointestinal: Positive for diarrhea (chronic unchanged), nausea and vomiting. Negative for abdominal pain, blood in stool and constipation.       +"acid reflux"/belching  Genitourinary: Negative for discharge, dysuria, hematuria, scrotal swelling and testicular pain.  Musculoskeletal: Negative for arthralgias and myalgias.  Skin: Negative for color change.  Allergic/Immunologic: Positive for immunocompromised state (DM1).  Neurological: Positive for light-headedness. Negative for weakness and numbness.  Psychiatric/Behavioral: Negative for confusion.   All other systems reviewed and are negative for acute change except as noted in the HPI.    Physical Exam Updated Vital Signs BP 107/86 (BP Location: Left Arm)   Pulse (!) 105   Temp 98 F (36.7 C) (Oral)   Resp (!) 21   Ht '5\' 10"'  (1.778 m)   Wt 59 kg (130 lb)   SpO2 100%   BMI 18.65 kg/m   Physical Exam  Constitutional: He is oriented to person, place, and time. Vital signs are normal. He appears well-developed and well-nourished.  Non-toxic appearance. He appears ill. He appears distressed (actively vomiting).  Afebrile, nontoxic however ill appearing, frail gentleman who appears older than stated age, actively vomiting  HENT:  Head: Normocephalic and atraumatic.  Mouth/Throat: Oropharynx is clear and moist. Mucous membranes are dry.  Very dry lips  Eyes: Conjunctivae and EOM are normal. Right eye exhibits no discharge. Left eye exhibits no discharge.  Neck: Normal range of motion. Neck supple.  Cardiovascular: Regular rhythm, normal heart sounds and intact distal pulses. Tachycardia present. Exam reveals no gallop and no friction rub.  No murmur heard. Slightly tachycardic  Pulmonary/Chest: Effort normal and breath sounds normal. No respiratory distress. He has no decreased breath sounds. He has no wheezes. He  has no rhonchi. He has no rales.  Breathing with shallow breaths however not quite kussmaul respirations  Abdominal: Soft. Normal appearance and bowel sounds are normal. He exhibits no distension. There is no tenderness. There is no rigidity, no rebound, no guarding, no CVA tenderness, no tenderness at McBurney's point and negative Murphy's sign.  Soft, NTND, +BS throughout, no r/g/r, neg murphy's, neg mcburney's, no CVA TTP   Musculoskeletal: Normal range of motion.  Neurological: He is alert  and oriented to person, place, and time. He has normal strength. No sensory deficit.  Skin: Skin is warm, dry and intact. No rash noted.  Psychiatric: He has a normal mood and affect.  Nursing note and vitals reviewed.    ED Treatments / Results  Labs (all labs ordered are listed, but only abnormal results are displayed) Labs Reviewed  COMPREHENSIVE METABOLIC PANEL - Abnormal; Notable for the following components:      Result Value   Chloride 84 (*)    CO2 33 (*)    Glucose, Bld 286 (*)    Alkaline Phosphatase 137 (*)    Total Bilirubin 2.3 (*)    Anion gap 20 (*)    All other components within normal limits  CBC - Abnormal; Notable for the following components:   Platelets 416 (*)    All other components within normal limits  URINALYSIS, ROUTINE W REFLEX MICROSCOPIC - Abnormal; Notable for the following components:   pH 9.0 (*)    Glucose, UA 150 (*)    Bilirubin Urine SMALL (*)    Ketones, ur 80 (*)    Protein, ur >=300 (*)    Bacteria, UA RARE (*)    Squamous Epithelial / LPF 0-5 (*)    All other components within normal limits  BLOOD GAS, ARTERIAL - Abnormal; Notable for the following components:   pH, Arterial 7.479 (*)    pO2, Arterial 60.6 (*)    Bicarbonate 34.5 (*)    Acid-Base Excess 9.8 (*)    All other components within normal limits  CBG MONITORING, ED - Abnormal; Notable for the following components:   Glucose-Capillary 272 (*)    All other components within normal  limits  I-STAT CG4 LACTIC ACID, ED - Abnormal; Notable for the following components:   Lactic Acid, Venous 2.43 (*)    All other components within normal limits  LIPASE, BLOOD  MAGNESIUM  PHOSPHORUS  ETHANOL  I-STAT TROPONIN, ED    EKG  EKG Interpretation  Date/Time:  Saturday August 01 2017 13:52:43 EST Ventricular Rate:  82 PR Interval:    QRS Duration: 121 QT Interval:  419 QTC Calculation: 490 R Axis:   90 Text Interpretation:  Sinus rhythm Atrial premature complex Right bundle branch block No significant change since last tracing Confirmed by Gareth Morgan [phone]) on 08/01/2017 4:00:20 PM       Radiology No results found.  Procedures Procedures (including critical care time)  CRITICAL CARE Performed by: Reece Agar   Total critical care time: 35 minutes  Critical care time was exclusive of separately billable procedures and treating other patients.  Critical care was necessary to treat or prevent imminent or life-threatening deterioration.  Critical care was time spent personally by me on the following activities: development of treatment plan with patient and/or surrogate as well as nursing, discussions with consultants, evaluation of patient's response to treatment, examination of patient, obtaining history from patient or surrogate, ordering and performing treatments and interventions, ordering and review of laboratory studies, ordering and review of radiographic studies, pulse oximetry and re-evaluation of patient's condition.   Medications Ordered in ED Medications  sodium chloride 0.9 % bolus 1,000 mL (0 mLs Intravenous Stopped 08/01/17 1511)    And  sodium chloride 0.9 % bolus 1,000 mL (1,000 mLs Intravenous New Bag/Given 08/01/17 1505)    And  0.9 %  sodium chloride infusion (not administered)  dextrose 5 %-0.45 % sodium chloride infusion (not administered)  insulin regular (NOVOLIN R,HUMULIN R) 100  Units in sodium chloride 0.9 % 100 mL (1  Units/mL) infusion (not administered)  ondansetron (ZOFRAN) injection 4 mg (4 mg Intravenous Given 08/01/17 1409)  famotidine (PEPCID) IVPB 20 mg premix (0 mg Intravenous Stopped 08/01/17 1431)     Initial Impression / Assessment and Plan / ED Course  I have reviewed the triage vital signs and the nursing notes.  Pertinent labs & imaging results that were available during my care of the patient were reviewed by me and considered in my medical decision making (see chart for details).     41 y.o. male here with n/v x2 days, also feels lightheaded and has been belching and having "acid reflux". Has chronic diarrhea for which he has been worked up in the past, and was referred to GI at his last [facility] admission on 04/12/17 but never followed up. On exam, shallow breathing but not quite kussmaul respirations, lips appear very dry, thin and frail gentleman who appears older than stated age, actively vomiting, no abdominal tenderness. Labs thus far show: lipase WNL, CBC with marginally elevated platelets but otherwise WNL, CMP with low Cl 84 gluc 286 Tbili 2.3 (similar to prior) alk phos marginally elevated at 137, anion gap of 20, but oddly his bicarb is elevated at 33; seems like DKA is possible, however bicarb being elevated is odd. Will start with fluids while we get more information, if ketones in urine or pH low then will start glucostabilizer. Will add-on Mg/phos level, ABG, trop, EKG, lactic, and EtOH levels. Will give zofran, pepcid, and fluids, then reassess shortly. Discussed case with my attending Dr. Billy Fischer who agrees with plan.   3:46 PM U/A with ketones and glucose as well as protein, no evidence of UTI. ABG with marginally high pH 7.479 and confirms that the bicarb is slightly elevated, which is odd; pt denies EtOH today or ingestion of any antifreeze or odd ingestions. Could be that the vomiting/dehydration is causing a relative alkalosis which masks the acidosis we would've expected for  DKA? Glucostabilizer started.  EtOH level undetectable. Lactic elevated at 2.43, fluids running. Trop neg. EKG without acute ischemic changes. Mg and Phos levels WNL. Pt feeling better, tachycardia improving, however given ketosis and possible early DKA, with elevated anion gap and lactic level, will proceed with admission.    3:53 PM Dr. Lunette Stands of [facility] returning page and will admit. Holding orders to be placed by admitting team. Please see their notes for further documentation of care. I appreciate their help with this pleasant pt's care. Pt stable at time of admission.     Final Clinical Impressions(s) / ED Diagnoses   Final diagnoses:  Nausea and vomiting in adult patient  Chronic diarrhea  Diabetic ketosis ([place])  Hyperglycemia due to type 1 diabetes mellitus [facility])  Hyperbilirubinemia    ED Discharge Orders    [address] [place], [place] 08/01/17 1600    Gareth Morgan, MD 08/03/17 [phone]

## 2017-08-01 NOTE — ED Notes (Signed)
Writer notified PA for abnormal I stat lactic result

## 2017-08-01 NOTE — ED Triage Notes (Addendum)
Pt arrived with EMS with chief complaint N/V/D for last 4 days, pt is diabetic, CBG 371, EMS unable to obtain IV. Pt has chronic diarrhea and would like a referral  with GI for treatment. 113/80-80-16-99% RA

## 2017-08-01 NOTE — ED Notes (Signed)
Called lab to add on Mag and Phosphorus.

## 2017-08-01 NOTE — H&P (Signed)
Jared Murray is an 41 y.o. male.   Chief Complaint: Nausea, vomiting, diarrhea   HPI: 41 year old male with history of type 1 diabetes mellitus had multiple admissions in the past with nausea, vomiting, diarrhea.  Patient has been having diarrhea for the last 6 months, had some workup done at [facility] and was recommended to follow-up as an outpatient.  Patient states has been vomiting multiple episodes For the last 2 days, unable to keep down any food.  Workup in the emergency department patient was found to have blood sugars of 286 with anion gap of 20, however patient has a normal bicarb of 33, normal pH of 7.4.  Patient has urine ketones positive.  Patient was initially started on insulin drip in the emergency department Past Medical History:  Diagnosis Date  . DKA (diabetic ketoacidoses) ([place])   . Eczema   . GERD (gastroesophageal reflux disease)   . Psoriasis   . Substance abuse ([place])   . Type 1 diabetes mellitus ([place])     Past Surgical History:  Procedure Laterality Date  . FINGER SURGERY      Family History  Problem Relation Age of Onset  . Hypertension Father   . Diabetes Unknown        multiple  . Lupus Cousin   . Stroke Maternal Grandmother   . Stroke Paternal Grandmother    Social History:  reports that he has been smoking cigarettes.  He has a 2.50 pack-year smoking history. he has never used smokeless tobacco. He reports that he drinks about 0.6 - 1.2 oz of alcohol per week. He reports that he uses drugs. Drug: Marijuana.  Allergies:  Allergies  Allergen Reactions  . Latex Itching and Rash  . Penicillins Other (See Comments)    Childhood allergy Has patient had a PCN reaction causing immediate rash, facial/tongue/throat swelling, SOB or lightheadedness with hypotension: NO Has patient had a PCN reaction causing severe rash involving mucus membranes or skin necrosis:NO Has patient had a PCN reaction that required hospitalization NO Has patient had a PCN reaction  occurring within the last 10 years: NO If all of the above answers are "NO", then may proceed with Cephalosporin use.    Medications Prior to Admission  Medication Sig Dispense Refill  . aspirin-sod bicarb-citric acid (ALKA-SELTZER) 325 MG TBEF tablet Take 325 mg by mouth every 6 (six) hours as needed (heartburn).    . insulin glargine (LANTUS) 100 UNIT/ML injection Inject 0.4 mLs (40 Units total) into the skin at bedtime. (Patient taking differently: Inject 10 Units into the skin every morning. ) 30 mL 0  . NOVOLOG 100 UNIT/ML injection INJECT 0 TO 12 UNITS INTO THE SKIN THREE TIMES DAILY WITH MEALS AS PER SLIDING SCALE (Patient taking differently: Inject 1-10 Units into the skin 3 (three) times daily with meals. Inject  1 unit per every 15 carbs of meal plus SS per blood glucose 71-150: 3 units, 151-200: 4 units, 201-250: 5 units, 251-300: 6 units, 301-350: 7 units, 351-400: 8 units , > 400: 10 units) 10 mL 0  . ACCU-CHEK SOFTCLIX LANCETS lancets USE AS INSTRUCTED THREE TIMES DAILY 100 each 5  . atorvastatin (LIPITOR) 20 MG tablet Take 1 tablet (20 mg total) by mouth daily. (Patient not taking: Reported on 04/01/2017) 30 tablet 3  . clobetasol cream (TEMOVATE) 0.05 % APPLY EXTERNALLY TO THE AFFECTED AREA TWICE DAILY (Patient not taking: Reported on 08/01/2017) 30 g 0  . glucose blood (ACCU-CHEK AVIVA PLUS) test strip USE TO  CHECK BLOOD SUGAR FOUR TIMES DAILY BEFORE MEALS AND AT BEDTIME 120 each 5  . Insulin Syringe-Needle U-100 (B-D INS SYRINGE 2CC/29GX1/2") 29G X 1/2" 2 ML MISC Check Blood sugar TID and QHS 100 each 12  . pantoprazole (PROTONIX) 40 MG tablet Take 1 tablet (40 mg total) by mouth daily. (Patient not taking: Reported on 08/01/2017) 30 tablet 5  . sildenafil (VIAGRA) 50 MG tablet Take 1 tablet (50 mg total) by mouth daily as needed for erectile dysfunction. At least 24 hours between doses (Patient not taking: Reported on 08/01/2017) 30 tablet 3    Results for orders placed or  performed during the hospital encounter of 08/01/17 (from the past 48 hour(s))  Lipase, blood     Status: None   Collection Time: 08/01/17 12:51 PM  Result Value Ref Range   Lipase 15 11 - 51 U/L  Comprehensive metabolic panel     Status: Abnormal   Collection Time: 08/01/17 12:51 PM  Result Value Ref Range   Sodium 137 135 - 145 mmol/L   Potassium 3.8 3.5 - 5.1 mmol/L   Chloride 84 (L) 101 - 111 mmol/L   CO2 33 (H) 22 - 32 mmol/L   Glucose, Bld 286 (H) 65 - 99 mg/dL   BUN 19 6 - 20 mg/dL   Creatinine, Ser 1.04 0.61 - 1.24 mg/dL   Calcium 9.5 8.9 - 10.3 mg/dL   Total Protein 8.1 6.5 - 8.1 g/dL   Albumin 4.8 3.5 - 5.0 g/dL   AST 35 15 - 41 U/L   ALT 43 17 - 63 U/L   Alkaline Phosphatase 137 (H) 38 - 126 U/L   Total Bilirubin 2.3 (H) 0.3 - 1.2 mg/dL   GFR calc non Af Amer >60 >60 mL/min   GFR calc Af Amer >60 >60 mL/min    Comment: (NOTE) The eGFR has been calculated using the CKD EPI equation. This calculation has not been validated in all clinical situations. eGFR's persistently <60 mL/min signify possible Chronic Kidney Disease.    Anion gap 20 (H) 5 - 15  CBC     Status: Abnormal   Collection Time: 08/01/17 12:51 PM  Result Value Ref Range   WBC 6.6 4.0 - 10.5 K/uL   RBC 4.57 4.22 - 5.81 MIL/uL   Hemoglobin 14.0 13.0 - 17.0 g/dL   HCT 40.3 39.0 - 52.0 %   MCV 88.2 78.0 - 100.0 fL   MCH 30.6 26.0 - 34.0 pg   MCHC 34.7 30.0 - 36.0 g/dL   RDW 12.7 11.5 - 15.5 %   Platelets 416 (H) 150 - 400 K/uL  Magnesium     Status: None   Collection Time: 08/01/17 12:51 PM  Result Value Ref Range   Magnesium 1.9 1.7 - 2.4 mg/dL  Phosphorus     Status: None   Collection Time: 08/01/17 12:51 PM  Result Value Ref Range   Phosphorus 3.0 2.5 - 4.6 mg/dL  CBG monitoring, ED     Status: Abnormal   Collection Time: 08/01/17 12:57 PM  Result Value Ref Range   Glucose-Capillary 272 (H) 65 - 99 mg/dL  Urinalysis, Routine w reflex microscopic     Status: Abnormal   Collection Time:  08/01/17  1:50 PM  Result Value Ref Range   Color, Urine YELLOW YELLOW   APPearance CLEAR CLEAR   Specific Gravity, Urine 1.026 1.005 - 1.030   pH 9.0 (H) 5.0 - 8.0   Glucose, UA 150 (A) NEGATIVE mg/dL  Hgb urine dipstick NEGATIVE NEGATIVE   Bilirubin Urine SMALL (A) NEGATIVE   Ketones, ur 80 (A) NEGATIVE mg/dL   Protein, ur >=300 (A) NEGATIVE mg/dL   Nitrite NEGATIVE NEGATIVE   Leukocytes, UA NEGATIVE NEGATIVE   RBC / HPF 0-5 0 - 5 RBC/hpf   WBC, UA 0-5 0 - 5 WBC/hpf   Bacteria, UA RARE (A) NONE SEEN   Squamous Epithelial / LPF 0-5 (A) NONE SEEN   Hyaline Casts, UA PRESENT   Blood gas, arterial     Status: Abnormal   Collection Time: 08/01/17  1:55 PM  Result Value Ref Range   FIO2 21.00    Delivery systems ROOM AIR    pH, Arterial 7.479 (H) 7.350 - 7.450   pCO2 arterial 46.9 32.0 - 48.0 mmHg   pO2, Arterial 60.6 (L) 83.0 - 108.0 mmHg   Bicarbonate 34.5 (H) 20.0 - 28.0 mmol/L   Acid-Base Excess 9.8 (H) 0.0 - 2.0 mmol/L   O2 Saturation 90.4 %   Patient temperature 98.6    Collection site RIGHT RADIAL    Drawn by [phone]    Sample type ARTERIAL DRAW    Allens test (pass/fail) PASS PASS  I-stat troponin, ED     Status: None   Collection Time: 08/01/17  2:04 PM  Result Value Ref Range   Troponin i, poc 0.00 0.00 - 0.08 ng/mL   Comment 3            Comment: Due to the release kinetics of cTnI, a negative result within the first hours of the onset of symptoms does not rule out myocardial infarction with certainty. If myocardial infarction is still suspected, repeat the test at appropriate intervals.   Ethanol     Status: None   Collection Time: 08/01/17  2:04 PM  Result Value Ref Range   Alcohol, Ethyl (B) <10 <10 mg/dL    Comment:        LOWEST DETECTABLE LIMIT FOR SERUM ALCOHOL IS 10 mg/dL FOR MEDICAL PURPOSES ONLY   I-Stat CG4 Lactic Acid, ED     Status: Abnormal   Collection Time: 08/01/17  2:08 PM  Result Value Ref Range   Lactic Acid, Venous 2.43 (HH) 0.5 -  1.9 mmol/L   Comment NOTIFIED PHYSICIAN   POC CBG, ED     Status: Abnormal   Collection Time: 08/01/17  4:26 PM  Result Value Ref Range   Glucose-Capillary 246 (H) 65 - 99 mg/dL   No results found.  Review of Systems  Constitutional: Positive for malaise/fatigue.  Gastrointestinal: Positive for diarrhea, nausea and vomiting.  All other systems reviewed and are negative.   Blood pressure 117/71, pulse 87, temperature 98.6 F (37 C), temperature source Oral, resp. rate 16, height _0  (1.778 m), weight 59 kg (130 lb), SpO2 100 %. Physical Exam  Constitutional: He is oriented to person, place, and time.  Thin built, chronically looking male looks much older than his stated age laying down in the bed not in distress  HENT:  Head: Normocephalic and atraumatic.  Eyes: Conjunctivae and EOM are normal. Pupils are equal, round, and reactive to light.  Neck: Normal range of motion. Neck supple.  Cardiovascular: Normal rate and regular rhythm.  GI: Soft. Bowel sounds are normal.  Musculoskeletal: Normal range of motion.  Neurological: He is alert and oriented to person, place, and time.  Skin: Skin is warm and dry.  Psychiatric: He has a normal mood and affect. His behavior is normal. Judgment and  thought content normal.     Assessment/Plan  ##Nausea, vomiting -DDX gastroenteritis versus gastroparesis -Keep the patient n.p.o. -Continue with IV fluids -Continue with antinausea medications  ##Diarrhea -Get C. difficile toxin -Considering patient's history of chronic alcohol use possibility of pancreatic insufficiency, cannot exclude bacterial overgrowth. -Get CT abdomen and pelvis as I do not see any previous CT abdomen and pelvis  ##Diabetes mellitus type 1 -Patient's last hemoglobin A1c was 10 -Unlikely patient has a DKA -Elevated ketones were from starvation from nausea and vomiting -Continue with home dose of Lantus and sliding scale insulin  ##History of psoriasis -No  current issues  ##History of avascular necrosis of both hip -Depending good glycemic control  Keep the patient on DVT prophylaxis with Lovenox  Thurma Priego, MD 08/01/2017, 5:44 PM

## 2017-08-01 NOTE — ED Notes (Signed)
Pt is aware a urine sample is needed, but stated he is unable to provide one at this time. Urinal at bedside.

## 2017-08-02 DIAGNOSIS — E1310 Other specified diabetes mellitus with ketoacidosis without coma: Secondary | ICD-10-CM

## 2017-08-02 DIAGNOSIS — R112 Nausea with vomiting, unspecified: Secondary | ICD-10-CM | POA: Diagnosis not present

## 2017-08-02 DIAGNOSIS — E1065 Type 1 diabetes mellitus with hyperglycemia: Secondary | ICD-10-CM | POA: Diagnosis not present

## 2017-08-02 DIAGNOSIS — K529 Noninfective gastroenteritis and colitis, unspecified: Secondary | ICD-10-CM | POA: Diagnosis not present

## 2017-08-02 LAB — GLUCOSE, CAPILLARY
GLUCOSE-CAPILLARY: 206 mg/dL — AB (ref 65–99)
GLUCOSE-CAPILLARY: 166 mg/dL — AB (ref 65–99)
Glucose-Capillary: 103 mg/dL — ABNORMAL HIGH (ref 65–99)
Glucose-Capillary: 106 mg/dL — ABNORMAL HIGH (ref 65–99)
Glucose-Capillary: 97 mg/dL (ref 65–99)

## 2017-08-02 LAB — BASIC METABOLIC PANEL
ANION GAP: 11 (ref 5–15)
Anion gap: 6 (ref 5–15)
BUN: 14 mg/dL (ref 6–20)
BUN: 11 mg/dL (ref 6–20)
CALCIUM: 8.1 mg/dL — AB (ref 8.9–10.3)
CO2: 30 mmol/L (ref 22–32)
CO2: 31 mmol/L (ref 22–32)
CREATININE: 0.65 mg/dL (ref 0.61–1.24)
Calcium: 8 mg/dL — ABNORMAL LOW (ref 8.9–10.3)
Chloride: 93 mmol/L — ABNORMAL LOW (ref 101–111)
Chloride: 95 mmol/L — ABNORMAL LOW (ref 101–111)
Creatinine, Ser: 0.75 mg/dL (ref 0.61–1.24)
GFR calc Af Amer: >60 mL/min (ref >60)
GLUCOSE: 99 mg/dL (ref 65–99)
GLUCOSE: 188 mg/dL — AB (ref 65–99)
POTASSIUM: 2.7 mmol/L — AB (ref 3.5–5.1)
Potassium: 3.2 mmol/L — ABNORMAL LOW (ref 3.5–5.1)
SODIUM: 134 mmol/L — AB (ref 135–145)
SODIUM: 132 mmol/L — AB (ref 135–145)

## 2017-08-02 LAB — C DIFFICILE QUICK SCREEN W PCR REFLEX
C DIFFICILE (CDIFF) TOXIN: NEGATIVE
C DIFFICLE (CDIFF) ANTIGEN: POSITIVE — AB

## 2017-08-02 LAB — MAGNESIUM: MAGNESIUM: 1.7 mg/dL (ref 1.7–2.4)

## 2017-08-02 LAB — CLOSTRIDIUM DIFFICILE BY PCR, REFLEXED: CDIFFPCR: NEGATIVE

## 2017-08-02 MED ORDER — ALUM & MAG HYDROXIDE-SIMETH 200-200-20 MG/5ML PO SUSP: 15.0000 mL | Freq: Four times a day (QID) | ORAL | 0 refills | Status: DC | PRN

## 2017-08-02 MED ORDER — INSULIN GLARGINE 100 UNIT/ML ~~LOC~~ SOLN
10.0000 [IU] | Freq: Every day | SUBCUTANEOUS | Status: DC
  Filled 2017-08-02: qty 0.1

## 2017-08-02 MED ORDER — ONDANSETRON HCL 4 MG PO TABS: 4.0000 mg | ORAL_TABLET | Freq: Every day | ORAL | 0 refills | Status: AC | PRN

## 2017-08-02 MED ORDER — POTASSIUM CHLORIDE CRYS ER 20 MEQ PO TBCR
40.0000 meq | EXTENDED_RELEASE_TABLET | ORAL | Status: AC
  Administered 2017-08-02 (×2): 40 meq via ORAL
  Filled 2017-08-02 (×2): qty 2

## 2017-08-02 NOTE — Plan of Care (Signed)
Pt vomited during shift.

## 2017-08-02 NOTE — Discharge Summary (Addendum)
Physician Discharge Summary  Jared Murray:[id number] DOB: 1975/11/27 DOA: 08/01/2017  PCP: [name], MD  Admit date: 08/01/2017 Discharge date: 08/02/2017  Admitted From: Home Disposition: Home   Recommendations for Outpatient Follow-up:  1. Follow up with PCP in 1-2 weeks 2. Please obtain BMP 3. Referral to GI for chronic diarrhea recommended  Home Health: None Equipment/Devices: None Discharge Condition: Stable CODE STATUS: Full Diet recommendation: Carbohydrate-limited  Brief/Interim Summary: Jared Murray is a 41 year old male with history of type 1 diabetes mellitus had multiple admissions in the past with nausea, vomiting, diarrhea.  Patient has been having diarrhea for the last 6 months, had some workup done at [facility] and was recommended to follow-up as an outpatient.  Patient states has been vomiting multiple episodes for the last 2 days, unable to keep down any food.  Workup in the emergency department patient was found to have blood sugars of 286 with anion gap of 20, however patient has a normal bicarb of 33, normal pH of 7.4.  Patient has urine ketones positive.  Patient was initially started on insulin drip in the emergency department, though this was stopped on admission due to suspicion that findings were attributable to poor per oral intake. With IV fluids and antiemetics, symptoms subsided and he is stable for discharge.     Discharge Diagnoses:  Active Problems:   Nausea and vomiting   Chronic diarrhea   Hyperglycemia due to type 1 diabetes mellitus (HCC)   Intractable nausea and vomiting  Hyperglycemia and T1DM: Patient had evidence of hyperglycemia on arrival but had been taking insulin. Ketones were evident, though in the absence of acidosis, this was felt attributable to extensive vomiting. - Improved, continue with home regimen.  - Follow up with endocrinology at [facility]  Nausea and vomiting: Resolved. Possibly related to gastroparesis, as gastric  distention was noted on CT scan. Resolved without reglan.  - Continue antiemetic - Consider gastroscintigraphy if symptoms return.   GERD: Some reflux of enteric contrast was noted on CT.  - Continue PPI   Dehydration: Resolved.  Diarrhea: x6 months without bleeding, stable/slightly improved from baseline for the past few days. Abdominal exam completely benign, no WBC or leukocytosis.  - CDiff sent with negative toxin, PCR negative, so this was not treated. - Needs referral to GI specialist expedited.  Hypokalemia:  - Improved with replacement, anticipate ongoing improvement with increased po intake.   Avascular hip necrosis bilaterally: Seen on imaging.  - Follow up as outpatient, pt reports he's planning THA's.   Discharge Instructions Discharge Instructions    Diet Carb Modified   Complete by:  As directed    Discharge instructions   Complete by:  As directed    You were admitted for dehydration due to nausea and vomiting which has improved. You are able to tolerate food/drink and are stable for discharge with the following recommendations:  - Continue to take fluids as tolerated. If your symptoms return/worsen, seek medical attention right away.  - You may take zofran as needed for nausea, this was sent to your pharmacy.  - Continue taking other medications as you were.  - Call the phone number listed below to establish care with a gastroenterologist.  - Follow up with your PCP in the next 1 - 2 weeks to facilitate referral and for hospital follow up appointment.   Increase activity slowly   Complete by:  As directed      Allergies as of 08/02/2017  Reactions   Latex Itching, Rash   Penicillins Other (See Comments)   Childhood allergy Has patient had a PCN reaction causing immediate rash, facial/tongue/throat swelling, SOB or lightheadedness with hypotension: NO Has patient had a PCN reaction causing severe rash involving mucus membranes or skin necrosis:NO Has  patient had a PCN reaction that required hospitalization NO Has patient had a PCN reaction occurring within the last 10 years: NO If all of the above answers are "NO", then may proceed with Cephalosporin use.      Medication List    TAKE these medications   ACCU-CHEK SOFTCLIX LANCETS lancets USE AS INSTRUCTED THREE TIMES DAILY   alum & mag hydroxide-simeth 200-200-20 MG/5ML suspension Commonly known as:  MAALOX/MYLANTA Take 15 mLs by mouth every 6 (six) hours as needed for indigestion or heartburn.   aspirin-sod bicarb-citric acid 325 MG Tbef tablet Commonly known as:  ALKA-SELTZER Take 325 mg by mouth every 6 (six) hours as needed (heartburn).   atorvastatin 20 MG tablet Commonly known as:  LIPITOR Take 1 tablet (20 mg total) by mouth daily.   clobetasol cream 0.05 % Commonly known as:  TEMOVATE APPLY EXTERNALLY TO THE AFFECTED AREA TWICE DAILY   glucose blood test strip Commonly known as:  ACCU-CHEK AVIVA PLUS USE TO CHECK BLOOD SUGAR FOUR TIMES DAILY BEFORE MEALS AND AT BEDTIME   insulin glargine 100 UNIT/ML injection Commonly known as:  LANTUS Inject 0.4 mLs (40 Units total) into the skin at bedtime. What changed:    how much to take  when to take this   Insulin Syringe-Needle U-100 29G X 1/2" 2 ML Misc Commonly known as:  B-D INS SYRINGE 2CC/29GX1/2" Check Blood sugar TID and QHS   NOVOLOG 100 UNIT/ML injection Generic drug:  insulin aspart INJECT 0 TO 12 UNITS INTO THE SKIN THREE TIMES DAILY WITH MEALS AS PER SLIDING SCALE What changed:    how much to take  how to take this  when to take this  additional instructions   ondansetron 4 MG tablet Commonly known as:  ZOFRAN Take 1 tablet (4 mg total) by mouth daily as needed for nausea or vomiting.   pantoprazole 40 MG tablet Commonly known as:  PROTONIX Take 1 tablet (40 mg total) by mouth daily.   sildenafil 50 MG tablet Commonly known as:  VIAGRA Take 1 tablet (50 mg total) by mouth daily as  needed for erectile dysfunction. At least 24 hours between doses      Follow-up Information    [name], MD Follow up.   Specialty:  Family Medicine Contact information: [address] [place] [address] [phone]        Gastroenterology, [name]. Schedule an appointment as soon as possible for a visit.   Contact information: [address] [name] [address] [phone]          Allergies  Allergen Reactions  . Latex Itching and Rash  . Penicillins Other (See Comments)    Childhood allergy Has patient had a PCN reaction causing immediate rash, facial/tongue/throat swelling, SOB or lightheadedness with hypotension: NO Has patient had a PCN reaction causing severe rash involving mucus membranes or skin necrosis:NO Has patient had a PCN reaction that required hospitalization NO Has patient had a PCN reaction occurring within the last 10 years: NO If all of the above answers are "NO", then may proceed with Cephalosporin use.    Consultations:  None  Procedures/Studies: Ct Abdomen Pelvis W Contrast  Result Date:  08/01/2017 CLINICAL DATA:  Chronic diarrhea.  Nausea and vomiting. EXAM: CT ABDOMEN AND PELVIS WITH CONTRAST TECHNIQUE: Multidetector CT imaging of the abdomen and pelvis was performed using the standard protocol following bolus administration of intravenous contrast. CONTRAST:  158mL ISOVUE-300 IOPAMIDOL (ISOVUE-300) INJECTION 61% COMPARISON:  None. FINDINGS: Lower chest: The lung bases are clear. Hepatobiliary: No focal hepatic lesion. Mild decreased hepatic density suggests steatosis. Gallbladder minimally distended, no calcified stone or pericholecystic inflammation. Pancreas: Not well visualized likely secondary to parenchymal atrophy. Spleen: Normal in size without focal abnormality. Adrenals/Urinary Tract: Normal adrenal glands. No hydronephrosis or perinephric edema. Homogeneous renal enhancement with symmetric excretion on delayed  phase imaging. 14 mm cyst in the mid right kidney. Urinary bladder is physiologically distended without wall thickening. Stomach/Bowel: Mild gastric distention with enteric contrast. Enteric contrast in the distal esophagus with small hiatal hernia. No small bowel inflammation, wall thickening or obstruction. Borderline diffuse colonic wall thickening without pericolonic inflammatory change. Normal appendix. Vascular/Lymphatic: No acute vascular abnormality. No bulky adenopathy. Reproductive: Prostate is unremarkable. Other: No free air or ascites. Generalized paucity of subcutaneous and intra-abdominal fat. Musculoskeletal: Advanced chronic changes about both hips with joint space narrowing, large subchondral cysts with subchondral sclerosis and flattening of both femoral heads. No acute osseous abnormality. IMPRESSION: 1. Borderline diffuse colonic wall thickening without pericolonic inflammation, of unknown significance in etiology. 2. Gastric distention can be seen with gastroparesis. Enteric contrast in the distal esophagus may be delayed transit or reflux. 3. Mild hepatic steatosis. 4. Chronic changes of both hips may be due to age advanced osteoarthritis or sequela of AVN. Electronically Signed   By: Jeb Levering M.D.   On: 08/01/2017 23:55   Subjective: Nausea and vomiting have resolved. He's taking good per oral intake, ambulating, diarrhea is minimal and chronic.   Discharge Exam: Vitals:   08/02/17 0438 08/02/17 1421  BP: 116/77 110/75  Pulse: 75 76  Resp: 18 16  Temp: 98.7 F (37.1 C) 98.7 F (37.1 C)  SpO2: 100% 99%   General: Pt is alert, awake, not in acute distress Cardiovascular: RRR, S1/S2 +, no rubs, no gallops Respiratory: CTA bilaterally, no wheezing, no rhonchi Abdominal: Soft, scaphoid, NT, ND, bowel sounds +  Labs: BNP (last 3 results) No results for input(s): BNP in the last 8760 hours. Basic Metabolic Panel: Recent Labs  Lab 08/01/17 1251 08/02/17 0527  08/02/17 1438 08/02/17 1439  NA 137 134*  --  132*  K 3.8 2.7*  --  3.2*  CL 84* 93*  --  95*  CO2 33* 30  --  31  GLUCOSE 286* 99  --  188*  BUN 19 14  --  11  CREATININE 1.04 0.75  --  0.65  CALCIUM 9.5 8.1*  --  8.0*  MG 1.9  --  1.7  --   PHOS 3.0  --   --   --    Liver Function Tests: Recent Labs  Lab 08/01/17 1251  AST 35  ALT 43  ALKPHOS 137*  BILITOT 2.3*  PROT 8.1  ALBUMIN 4.8   Recent Labs  Lab 08/01/17 1251  LIPASE 15   No results for input(s): AMMONIA in the last 168 hours. CBC: Recent Labs  Lab 08/01/17 1251  WBC 6.6  HGB 14.0  HCT 40.3  MCV 88.2  PLT 416*   Cardiac Enzymes: No results for input(s): CKTOTAL, CKMB, CKMBINDEX, TROPONINI in the last 168 hours. BNP: Invalid input(s): POCBNP CBG: Recent Labs  Lab 08/02/17 0011 08/02/17  0407 08/02/17 0730 08/02/17 1136 08/02/17 1605  GLUCAP 106* 103* 97 206* 166*   D-Dimer No results for input(s): DDIMER in the last 72 hours. Hgb A1c No results for input(s): HGBA1C in the last 72 hours. Lipid Profile No results for input(s): CHOL, HDL, LDLCALC, TRIG, CHOLHDL, LDLDIRECT in the last 72 hours. Thyroid function studies No results for input(s): TSH, T4TOTAL, T3FREE, THYROIDAB in the last 72 hours.  Invalid input(s): FREET3 Anemia work up No results for input(s): VITAMINB12, FOLATE, FERRITIN, TIBC, IRON, RETICCTPCT in the last 72 hours. Urinalysis    Component Value Date/Time   COLORURINE YELLOW 08/01/2017 1350   APPEARANCEUR CLEAR 08/01/2017 1350   LABSPEC 1.026 08/01/2017 1350   PHURINE 9.0 (H) 08/01/2017 1350   GLUCOSEU 150 (A) 08/01/2017 1350   HGBUR NEGATIVE 08/01/2017 1350   BILIRUBINUR SMALL (A) 08/01/2017 1350   BILIRUBINUR neg 03/16/2015 1230   KETONESUR 80 (A) 08/01/2017 1350   PROTEINUR >=300 (A) 08/01/2017 1350   UROBILINOGEN 0.2 06/25/2015 0913   NITRITE NEGATIVE 08/01/2017 1350   LEUKOCYTESUR NEGATIVE 08/01/2017 1350    Microbiology Recent Results (from the past 240  hour(s))  C difficile quick scan w PCR reflex     Status: Abnormal   Collection Time: 08/02/17 10:00 AM  Result Value Ref Range Status   C Diff antigen POSITIVE (A) NEGATIVE Final   C Diff toxin NEGATIVE NEGATIVE Final   C Diff interpretation Results are indeterminate. See PCR results.  Final  C. Diff by PCR, Reflexed     Status: None   Collection Time: 08/02/17 10:00 AM  Result Value Ref Range Status   Toxigenic C. Difficile by PCR NEGATIVE NEGATIVE Final    Comment: Patient is colonized with non toxigenic C. difficile. May not need treatment unless significant symptoms are present. Performed at [facility], [place] [address] [place], [place] [address]     Time coordinating discharge: Approximately 40 minutes  Vance Gather, MD  [facility] 08/03/2017, 4:37 PM Pager [phone]

## 2017-08-02 NOTE — Progress Notes (Signed)
CRITICAL VALUE ALERT  Critical Value:  K+ 2.7  Date & Time Notied:  08/02/17  0758  Provider Notified: Bonner Puna via page  Orders Received/Actions taken: pending response

## 2017-08-02 NOTE — Progress Notes (Signed)
Patient given discharge, follow up, and medication instructions, verbalized understanding, IV and telemetry box removed, personal belongings with patient, family to transport home after 5pm

## 2017-08-03 LAB — HEMOGLOBIN A1C
HEMOGLOBIN A1C: 12.7 % — AB (ref 4.8–5.6)
MEAN PLASMA GLUCOSE: 318 mg/dL

## 2017-08-03 LAB — HIV ANTIBODY (ROUTINE TESTING W REFLEX): HIV Screen 4th Generation wRfx: NONREACTIVE

## 2017-08-21 ENCOUNTER — Telehealth: Payer: Self-pay | Admitting: Family Medicine

## 2017-08-21 NOTE — Telephone Encounter (Signed)
He will receive that at an office visit.

## 2017-08-21 NOTE — Telephone Encounter (Signed)
Patient called asking for Tylenol #3

## 2017-09-03 ENCOUNTER — Ambulatory Visit: Payer: Self-pay | Attending: Family Medicine | Admitting: Family Medicine

## 2017-09-03 ENCOUNTER — Encounter: Payer: Self-pay | Admitting: Family Medicine

## 2017-09-03 VITALS — BP 97/64 | HR 100 | Temp 98.0°F | Ht 70.0 in

## 2017-09-03 DIAGNOSIS — K219 Gastro-esophageal reflux disease without esophagitis: Secondary | ICD-10-CM | POA: Insufficient documentation

## 2017-09-03 DIAGNOSIS — K529 Noninfective gastroenteritis and colitis, unspecified: Secondary | ICD-10-CM | POA: Insufficient documentation

## 2017-09-03 DIAGNOSIS — Z9119 Patient's noncompliance with other medical treatment and regimen: Secondary | ICD-10-CM | POA: Insufficient documentation

## 2017-09-03 DIAGNOSIS — M879 Osteonecrosis, unspecified: Secondary | ICD-10-CM | POA: Insufficient documentation

## 2017-09-03 DIAGNOSIS — M8700 Idiopathic aseptic necrosis of unspecified bone: Secondary | ICD-10-CM

## 2017-09-03 DIAGNOSIS — M170 Bilateral primary osteoarthritis of knee: Secondary | ICD-10-CM

## 2017-09-03 DIAGNOSIS — Z794 Long term (current) use of insulin: Secondary | ICD-10-CM | POA: Insufficient documentation

## 2017-09-03 DIAGNOSIS — E1065 Type 1 diabetes mellitus with hyperglycemia: Secondary | ICD-10-CM | POA: Insufficient documentation

## 2017-09-03 DIAGNOSIS — E1029 Type 1 diabetes mellitus with other diabetic kidney complication: Secondary | ICD-10-CM

## 2017-09-03 DIAGNOSIS — M19042 Primary osteoarthritis, left hand: Secondary | ICD-10-CM | POA: Insufficient documentation

## 2017-09-03 DIAGNOSIS — L409 Psoriasis, unspecified: Secondary | ICD-10-CM | POA: Insufficient documentation

## 2017-09-03 DIAGNOSIS — M19041 Primary osteoarthritis, right hand: Secondary | ICD-10-CM | POA: Insufficient documentation

## 2017-09-03 DIAGNOSIS — E1021 Type 1 diabetes mellitus with diabetic nephropathy: Secondary | ICD-10-CM | POA: Insufficient documentation

## 2017-09-03 DIAGNOSIS — Z7982 Long term (current) use of aspirin: Secondary | ICD-10-CM | POA: Insufficient documentation

## 2017-09-03 DIAGNOSIS — Z79899 Other long term (current) drug therapy: Secondary | ICD-10-CM | POA: Insufficient documentation

## 2017-09-03 LAB — GLUCOSE, POCT (MANUAL RESULT ENTRY): POC GLUCOSE: 132 mg/dL — AB (ref 70–99)

## 2017-09-03 MED ORDER — ATORVASTATIN CALCIUM 20 MG PO TABS: 20.0000 mg | ORAL_TABLET | Freq: Every day | ORAL | 3 refills | Status: DC

## 2017-09-03 MED ORDER — ACETAMINOPHEN-CODEINE #3 300-30 MG PO TABS: 1.0000 | ORAL_TABLET | Freq: Two times a day (BID) | ORAL | 1 refills | Status: DC | PRN

## 2017-09-03 NOTE — Progress Notes (Signed)
Subjective:  Patient ID: [name], male    DOB: [birth year]  Age: 42 y.o. MRN: [id number]  CC: Diabetes and Hospitalization Follow-up   HPI Jaryn LOLA LOFARO is a 42 year old male with a history of uncontrolled type 1 diabetes mellitus (A1c 12.7), noncompliance, multiple hospital admissions for DKA, avascular necrosis of both hips, Psoriasis who comes into the clinic for a follow-up visit.  He endorses compliance with his insulin and his blood sugar swings between normal sugars to the 400s and he states he has noticed hyperglycemia when he has had very little or nothing to eat. His diabetes is currently managed by [facility] endocrine.  He complains of chronic diarrhea for over 6 months and was referred to [facility] GI but was unable to see them due to changes in his Medicaid as he was informed he now has family planning Medicaid and not regular Medicaid and a visit would entail an out-of-pocket amount of $120 which he is unable to afford. Diarrhea sometimes happens in his sleep and messes up his bed sheets and this is concerning to him.  It is sometimes preceded by abdominal cramping at other times not. He does have intermittent nausea but no fevers.  He is requesting a refill of Tylenol No. 3 for his avascular necrosis of the hips.  His surgery has been put on hold due to his uncontrolled diabetes. He has also noticed stiffness of his hands worse with cold weather and in the morning and sometimes has catching of his fingers in an attempt to fully extend them.  Past Medical History:  Diagnosis Date  . DKA (diabetic ketoacidoses) ([place])   . Eczema   . GERD (gastroesophageal reflux disease)   . Psoriasis   . Substance abuse ([place])   . Type 1 diabetes mellitus ([place])     Past Surgical History:  Procedure Laterality Date  . FINGER SURGERY      Allergies  Allergen Reactions  . Latex Itching and Rash  . Penicillins Other (See Comments)    Childhood allergy Has patient had a PCN reaction  causing immediate rash, facial/tongue/throat swelling, SOB or lightheadedness with hypotension: NO Has patient had a PCN reaction causing severe rash involving mucus membranes or skin necrosis:NO Has patient had a PCN reaction that required hospitalization NO Has patient had a PCN reaction occurring within the last 10 years: NO If all of the above answers are "NO", then may proceed with Cephalosporin use.     Outpatient Medications Prior to Visit  Medication Sig Dispense Refill  . ACCU-CHEK SOFTCLIX LANCETS lancets USE AS INSTRUCTED THREE TIMES DAILY 100 each 5  . alum & mag hydroxide-simeth (MAALOX/MYLANTA) 200-200-20 MG/5ML suspension Take 15 mLs by mouth every 6 (six) hours as needed for indigestion or heartburn. 355 mL 0  . aspirin-sod bicarb-citric acid (ALKA-SELTZER) 325 MG TBEF tablet Take 325 mg by mouth every 6 (six) hours as needed (heartburn).    Marland Kitchen glucose blood (ACCU-CHEK AVIVA PLUS) test strip USE TO CHECK BLOOD SUGAR FOUR TIMES DAILY BEFORE MEALS AND AT BEDTIME 120 each 5  . insulin glargine (LANTUS) 100 UNIT/ML injection Inject 0.4 mLs (40 Units total) into the skin at bedtime. (Patient taking differently: Inject 10 Units into the skin every morning. ) 30 mL 0  . Insulin Syringe-Needle U-100 (B-D INS SYRINGE 2CC/29GX1/2") 29G X 1/2" 2 ML MISC Check Blood sugar TID and QHS 100 each 12  . NOVOLOG 100 UNIT/ML injection INJECT 0 TO 12 UNITS INTO THE SKIN  THREE TIMES DAILY WITH MEALS AS PER SLIDING SCALE (Patient taking differently: Inject 1-10 Units into the skin 3 (three) times daily with meals. Inject  1 unit per every 15 carbs of meal plus SS per blood glucose 71-150: 3 units, 151-200: 4 units, 201-250: 5 units, 251-300: 6 units, 301-350: 7 units, 351-400: 8 units , > 400: 10 units) 10 mL 0  . ondansetron (ZOFRAN) 4 MG tablet Take 1 tablet (4 mg total) by mouth daily as needed for nausea or vomiting. 10 tablet 0  . clobetasol cream (TEMOVATE) 0.05 % APPLY EXTERNALLY TO THE AFFECTED  AREA TWICE DAILY (Patient not taking: Reported on 08/01/2017) 30 g 0  . pantoprazole (PROTONIX) 40 MG tablet Take 1 tablet (40 mg total) by mouth daily. (Patient not taking: Reported on 08/01/2017) 30 tablet 5  . sildenafil (VIAGRA) 50 MG tablet Take 1 tablet (50 mg total) by mouth daily as needed for erectile dysfunction. At least 24 hours between doses (Patient not taking: Reported on 08/01/2017) 30 tablet 3  . atorvastatin (LIPITOR) 20 MG tablet Take 1 tablet (20 mg total) by mouth daily. (Patient not taking: Reported on 04/01/2017) 30 tablet 3   No facility-administered medications prior to visit.     ROS Review of Systems  Constitutional: Negative for activity change and appetite change.  HENT: Negative for sinus pressure and sore throat.   Eyes: Negative for visual disturbance.  Respiratory: Negative for cough, chest tightness and shortness of breath.   Cardiovascular: Negative for chest pain and leg swelling.  Gastrointestinal: Positive for diarrhea. Negative for abdominal distention, abdominal pain and constipation.  Endocrine: Negative.   Genitourinary: Negative for dysuria.  Musculoskeletal:       See hpi  Skin: Negative for rash.  Allergic/Immunologic: Negative.   Neurological: Negative for weakness, light-headedness and numbness.  Psychiatric/Behavioral: Negative for dysphoric mood and suicidal ideas.    Objective:  BP 97/64   Pulse 100   Temp 98 F (36.7 C) (Oral)   Ht 5\' 10"  (1.778 m)   SpO2 100%   BMI 18.65 kg/m   BP/Weight 09/03/2017 08/02/2017 77/93/9030  Systolic BP 97 092 -  Diastolic BP 64 75 -  Wt. (Lbs) - - 130  BMI 18.65 - 18.65      Physical Exam  Constitutional: He is oriented to person, place, and time. He appears well-developed and well-nourished.  Cardiovascular: Normal rate, normal heart sounds and intact distal pulses.  No murmur heard. Pulmonary/Chest: Effort normal and breath sounds normal. He has no wheezes. He has no rales. He exhibits  no tenderness.  Abdominal: Soft. Bowel sounds are normal. He exhibits no distension and no mass. There is no tenderness.  Musculoskeletal: He exhibits tenderness (TTP on both hip joints and on ROM).  Neurological: He is alert and oriented to person, place, and time.  Skin:  Hypopigmented lesions diffusely distributed on the entire body surface  Psychiatric: He has a normal mood and affect.     CMP Latest Ref Rng & Units 08/02/2017 08/02/2017 08/01/2017  Glucose 65 - 99 mg/dL 188(H) 99 286(H)  BUN 6 - 20 mg/dL 11 14 19   Creatinine 0.61 - 1.24 mg/dL 0.65 0.75 1.04  Sodium 135 - 145 mmol/L 132(L) 134(L) 137  Potassium 3.5 - 5.1 mmol/L 3.2(L) 2.7(LL) 3.8  Chloride 101 - 111 mmol/L 95(L) 93(L) 84(L)  CO2 22 - 32 mmol/L 31 30 33(H)  Calcium 8.9 - 10.3 mg/dL 8.0(L) 8.1(L) 9.5  Total Protein 6.5 - 8.1 g/dL - -  8.1  Total Bilirubin 0.3 - 1.2 mg/dL - - 2.3(H)  Alkaline Phos 38 - 126 U/L - - 137(H)  AST 15 - 41 U/L - - 35  ALT 17 - 63 U/L - - 43    Lipid Panel     Component Value Date/Time   CHOL 134 01/01/2017 0847   TRIG 82 01/01/2017 0847   HDL 44 01/01/2017 0847   CHOLHDL 3.0 01/01/2017 0847   CHOLHDL 2.3 12/19/2015 0947   VLDL 12 12/19/2015 0947   LDLCALC 74 01/01/2017 0847    Lab Results  Component Value Date   HGBA1C 12.7 (H) 08/02/2017    Assessment & Plan:   1. 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 Type 1 diabetes mellitus with hyperglycemia (HCC) Uncontrolled with A1c of 12.7 Continue Lantus and NovoLog sliding scale Followed by endocrine at [facility] - atorvastatin (LIPITOR) 20 MG tablet; Take 1 tablet (20 mg total) by mouth daily.  Dispense: 30 tablet; Refill: 3  3. Chronic diarrhea Suspicious for exocrine pancreatic insufficiency We will send of labs and if positive will place him [place]on Unable to see GI at this time due to problems with Medicaid - Fecal fat, qualitative; Future - Pancreatic  Elastase, Fecal; Future  4. Avascular necrosis ([place]) Surgery pending optimal glycemic control Followed by orthopedics - acetaminophen-codeine (TYLENOL #3) 300-30 MG tablet; Take 1 tablet by mouth every 12 (twelve) hours as needed for moderate pain.  Dispense: 60 tablet; Refill: 1  5. Osteoarthritis of both hands Advised to use OTC NSAIDs   Meds ordered this encounter  Medications  . acetaminophen-codeine (TYLENOL #3) 300-30 MG tablet    Sig: Take 1 tablet by mouth every 12 (twelve) hours as needed for moderate pain.    Dispense:  60 tablet    Refill:  1  . atorvastatin (LIPITOR) 20 MG tablet    Sig: Take 1 tablet (20 mg total) by mouth daily.    Dispense:  30 tablet    Refill:  3    Follow-up: Return in about 3 months (around 12/02/2017) for follow up of chronic medical conditions.   [name] MD

## 2017-09-03 NOTE — Patient Instructions (Signed)
Diarrhea, Adult °Diarrhea is when you have loose and water poop (stool) often. Diarrhea can make you feel weak and cause you to get dehydrated. Dehydration can make you tired and thirsty, make you have a dry mouth, and make it so you pee (urinate) less often. Diarrhea often lasts 2-3 days. However, it can last longer if it is a sign of something more serious. It is important to treat your diarrhea as told by your doctor. °Follow these instructions at home: °Eating and drinking ° °Follow these recommendations as told by your doctor: °· Take an oral rehydration solution (ORS). This is a drink that is sold at pharmacies and stores. °· Drink clear fluids, such as: °? Water. °? Ice chips. °? Diluted fruit juice. °? Low-calorie sports drinks. °· Eat bland, easy-to-digest foods in small amounts as you are able. These foods include: °? Bananas. °? Applesauce. °? Rice. °? Low-fat (lean) meats. °? Toast. °? Crackers. °· Avoid drinking fluids that have a lot of sugar or caffeine in them. °· Avoid alcohol. °· Avoid spicy or fatty foods. ° °General instructions ° °· Drink enough fluid to keep your pee (urine) clear or pale yellow. °· Wash your hands often. If you cannot use soap and water, use hand sanitizer. °· Make sure that all people in your home wash their hands well and often. °· Take over-the-counter and prescription medicines only as told by your doctor. °· Rest at home while you get better. °· Watch your condition for any changes. °· Take a warm bath to help with any burning or pain from having diarrhea. °· Keep all follow-up visits as told by your doctor. This is important. °Contact a doctor if: °· You have a fever. °· Your diarrhea gets worse. °· You have new symptoms. °· You cannot keep fluids down. °· You feel light-headed or dizzy. °· You have a headache. °· You have muscle cramps. °Get help right away if: °· You have chest pain. °· You feel very weak or you pass out (faint). °· You have bloody or black poop or  poop that look like tar. °· You have very bad pain, cramping, or bloating in your belly (abdomen). °· You have trouble breathing or you are breathing very quickly. °· Your heart is beating very quickly. °· Your skin feels cold and clammy. °· You feel confused. °· You have signs of dehydration, such as: °? Dark pee, hardly any pee, or no pee. °? Cracked lips. °? Dry mouth. °? Sunken eyes. °? Sleepiness. °? Weakness. °This information is not intended to replace advice given to you by your health care provider. Make sure you discuss any questions you have with your health care provider. °Document Released: 01/21/2008 Document Revised: 02/22/2016 Document Reviewed: 04/10/2015 °Elsevier Interactive Patient Education © 2018 [employer] ° °

## 2017-09-10 ENCOUNTER — Other Ambulatory Visit: Payer: Self-pay | Admitting: Family Medicine

## 2017-09-10 DIAGNOSIS — K8689 Other specified diseases of pancreas: Secondary | ICD-10-CM

## 2017-09-10 DIAGNOSIS — K529 Noninfective gastroenteritis and colitis, unspecified: Secondary | ICD-10-CM | POA: Insufficient documentation

## 2017-09-10 HISTORY — DX: Noninfective gastroenteritis and colitis, unspecified: K52.9

## 2017-09-10 LAB — PANCREATIC ELASTASE, FECAL: Pancreatic Elastase, Fecal: >500 ug Elast./g (ref >200)

## 2017-09-10 LAB — FECAL FAT, QUALITATIVE
FAT QUAL NEUTRAL STL: NORMAL
Fat Qual Total, Stl: NORMAL

## 2017-09-10 IMAGING — DX DG CHEST 1V
1 series · 1 of 1 positions shown · non-contrast
Comparison: August 23, 2015

CLINICAL DATA: Shortness of breath and nausea for 1 day. Diabetic
ketoacidosis

EXAM:
CHEST 1 VIEW

[chest ap]
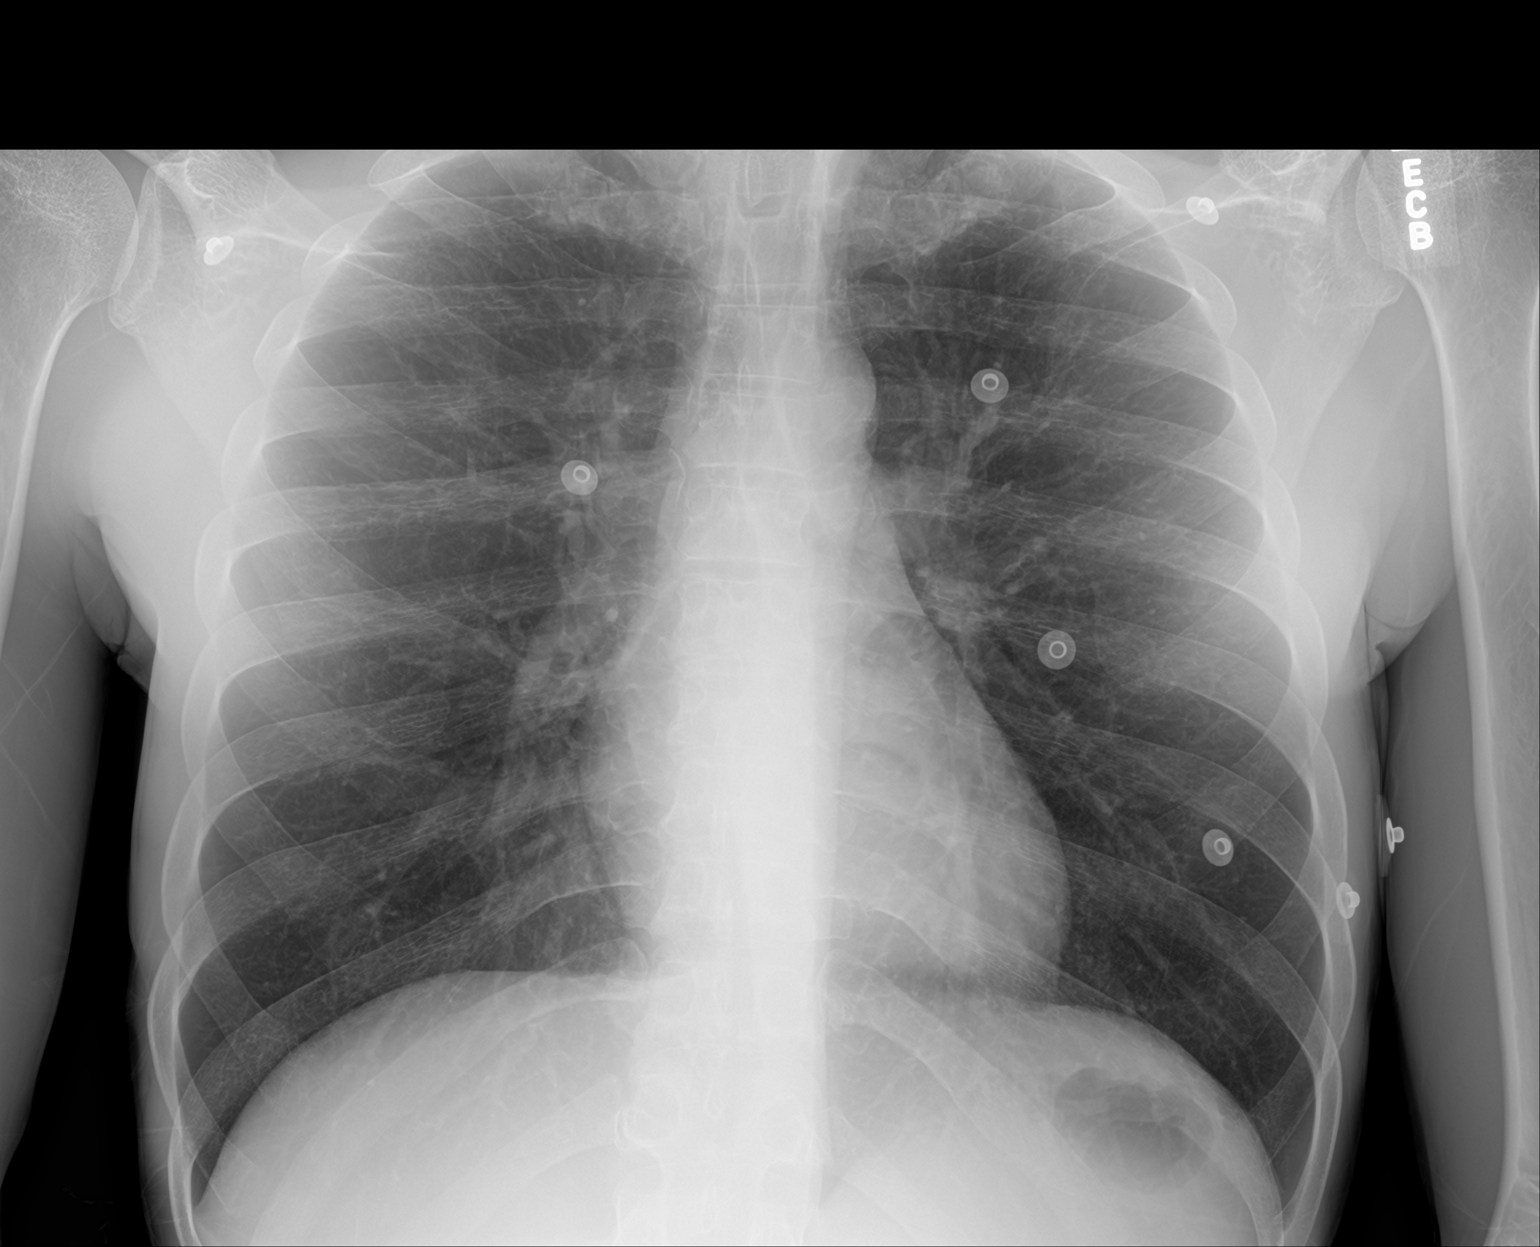

[1 of 1 positions shown; findings below may reference images not displayed]

FINDINGS: Lungs are clear. Heart size and pulmonary vascularity are normal. No
adenopathy. No bone lesions.
IMPRESSION: No edema or consolidation.

## 2017-09-10 MED ORDER — PANCRELIPASE (LIP-PROT-AMYL) 25000-79000 UNITS PO CPEP: 25000.0000 [IU] | ORAL_CAPSULE | Freq: Three times a day (TID) | ORAL | 3 refills | Status: DC

## 2017-09-14 MED FILL — !LANTUS 100 UNITS/ML VIAL: 100 | 25 days supply | Qty: 10 | Fill #0

## 2017-09-15 ENCOUNTER — Ambulatory Visit: Payer: Self-pay | Attending: Family Medicine

## 2017-09-15 MED FILL — ACETAMINOPHEN/COD #3 TABLET: 300-30 | 30 days supply | Qty: 60 | Fill #0

## 2017-10-13 MED FILL — !LANTUS 100 UNITS/ML VIAL: 100 | 25 days supply | Qty: 10 | Fill #1

## 2017-10-13 MED FILL — !NOVOLOG 100UNITS/ML VIAL: 100/ML | 27 days supply | Qty: 10 | Fill #0

## 2017-11-12 MED FILL — !LANTUS 100 UNITS/ML VIAL: 100 | 25 days supply | Qty: 10 | Fill #2

## 2017-11-25 ENCOUNTER — Ambulatory Visit: Payer: Self-pay | Attending: Family Medicine

## 2017-12-02 ENCOUNTER — Ambulatory Visit: Payer: Self-pay | Attending: Family Medicine | Admitting: Family Medicine

## 2017-12-02 ENCOUNTER — Encounter: Payer: Self-pay | Admitting: Family Medicine

## 2017-12-02 VITALS — BP 95/64 | HR 107 | Temp 98.2°F | Ht 70.0 in | Wt 131.0 lb

## 2017-12-02 DIAGNOSIS — Z7982 Long term (current) use of aspirin: Secondary | ICD-10-CM | POA: Insufficient documentation

## 2017-12-02 DIAGNOSIS — R197 Diarrhea, unspecified: Secondary | ICD-10-CM | POA: Insufficient documentation

## 2017-12-02 DIAGNOSIS — K529 Noninfective gastroenteritis and colitis, unspecified: Secondary | ICD-10-CM

## 2017-12-02 DIAGNOSIS — K219 Gastro-esophageal reflux disease without esophagitis: Secondary | ICD-10-CM | POA: Insufficient documentation

## 2017-12-02 DIAGNOSIS — M879 Osteonecrosis, unspecified: Secondary | ICD-10-CM | POA: Insufficient documentation

## 2017-12-02 DIAGNOSIS — E1065 Type 1 diabetes mellitus with hyperglycemia: Secondary | ICD-10-CM

## 2017-12-02 DIAGNOSIS — M8700 Idiopathic aseptic necrosis of unspecified bone: Secondary | ICD-10-CM

## 2017-12-02 DIAGNOSIS — Z79899 Other long term (current) drug therapy: Secondary | ICD-10-CM | POA: Insufficient documentation

## 2017-12-02 DIAGNOSIS — L409 Psoriasis, unspecified: Secondary | ICD-10-CM

## 2017-12-02 DIAGNOSIS — Z794 Long term (current) use of insulin: Secondary | ICD-10-CM | POA: Insufficient documentation

## 2017-12-02 DIAGNOSIS — Z880 Allergy status to penicillin: Secondary | ICD-10-CM | POA: Insufficient documentation

## 2017-12-02 DIAGNOSIS — E876 Hypokalemia: Secondary | ICD-10-CM

## 2017-12-02 LAB — POCT GLYCOSYLATED HEMOGLOBIN (HGB A1C): Hemoglobin A1C: 13.7

## 2017-12-02 LAB — GLUCOSE, POCT (MANUAL RESULT ENTRY): POC GLUCOSE: 176 mg/dL — AB (ref 70–99)

## 2017-12-02 MED ORDER — ATORVASTATIN CALCIUM 20 MG PO TABS: 20.0000 mg | ORAL_TABLET | Freq: Every day | ORAL | 3 refills | Status: DC

## 2017-12-02 MED ORDER — ACETAMINOPHEN-CODEINE #3 300-30 MG PO TABS: 1.0000 | ORAL_TABLET | Freq: Two times a day (BID) | ORAL | 1 refills | Status: DC | PRN

## 2017-12-02 MED ORDER — PANCRELIPASE (LIP-PROT-AMYL) 25000-79000 UNITS PO CPEP: 25000.0000 [IU] | ORAL_CAPSULE | Freq: Three times a day (TID) | ORAL | 3 refills | Status: DC

## 2017-12-02 NOTE — Progress Notes (Signed)
Subjective:  Patient ID: Jared Murray, male    DOB: 10/13/1975  Age: 42 y.o. MRN: [id number]  CC: Diabetes   HPI Jared Murray is a 42 year old male with a history of uncontrolled type 1 diabetes mellitus (A1c 13.7), noncompliance, multiple hospital admissions for DKA, avascular necrosis of both hips, Psoriasis who comes into the clinic for a follow-up visit. He recently had his Medicaid discontinued.  His diabetes mellitus is managed by [facility] endocrinology and his last visit was in 09/2017 at which time the plan was to continue Lantus 15 units in the morning, a carb ratio of 1 unit: 15 g;  sliding scale insulin 1 unit: 50 mg per dl > 200.  He has an upcoming appointment next month. Last eye exam: 05/2017 For his chronic diarrhea, his fecal fat and pancreatic elastase came back normal and negative for pancreatic insufficiency.  Celiac antibody as per [facility] nodes were negative.  I had placed him on Zenpep presumptively however he never picked this up. He continues to have diarrhea which is worse at night and has to use diapers to go to bed and at other times he has diarrhea after meals.  He has been unable to see GI due to lack of medical coverage.  He currently continues to have bilateral hip pain due to his avascular necrosis of the hips and surgeries pending optimization of glycemic control in the meantime he uses Tylenol 3 as needed for pain.  His psoriasis has not flared up lately and he is really having to use his clobetasol.  Past Medical History:  Diagnosis Date  . DKA (diabetic ketoacidoses) ([place])   . Eczema   . GERD (gastroesophageal reflux disease)   . Psoriasis   . Substance abuse ([place])   . Type 1 diabetes mellitus ([place])     Past Surgical History:  Procedure Laterality Date  . FINGER SURGERY      Allergies  Allergen Reactions  . Latex Itching and Rash  . Penicillins Other (See Comments)    Childhood allergy Has patient had a PCN reaction  causing immediate rash, facial/tongue/throat swelling, SOB or lightheadedness with hypotension: NO Has patient had a PCN reaction causing severe rash involving mucus membranes or skin necrosis:NO Has patient had a PCN reaction that required hospitalization NO Has patient had a PCN reaction occurring within the last 10 years: NO If all of the above answers are "NO", then may proceed with Cephalosporin use.     Outpatient Medications Prior to Visit  Medication Sig Dispense Refill  . ACCU-CHEK SOFTCLIX LANCETS lancets USE AS INSTRUCTED THREE TIMES DAILY 100 each 5  . alum & mag hydroxide-simeth (MAALOX/MYLANTA) 200-200-20 MG/5ML suspension Take 15 mLs by mouth every 6 (six) hours as needed for indigestion or heartburn. 355 mL 0  . aspirin-sod bicarb-citric acid (ALKA-SELTZER) 325 MG TBEF tablet Take 325 mg by mouth every 6 (six) hours as needed (heartburn).    Marland Kitchen glucose blood (ACCU-CHEK AVIVA PLUS) test strip USE TO CHECK BLOOD SUGAR FOUR TIMES DAILY BEFORE MEALS AND AT BEDTIME 120 each 5  . insulin glargine (LANTUS) 100 UNIT/ML injection Inject 0.4 mLs (40 Units total) into the skin at bedtime. (Patient taking differently: Inject 10 Units into the skin every morning. ) 30 mL 0  . Insulin Syringe-Needle U-100 (B-D INS SYRINGE 2CC/29GX1/2") 29G X 1/2" 2 ML MISC Check Blood sugar TID and QHS 100 each 12  . NOVOLOG 100 UNIT/ML injection INJECT 0 TO  12 UNITS INTO THE SKIN THREE TIMES DAILY WITH MEALS AS PER SLIDING SCALE (Patient taking differently: Inject 1-10 Units into the skin 3 (three) times daily with meals. Inject  1 unit per every 15 carbs of meal plus SS per blood glucose 71-150: 3 units, 151-200: 4 units, 201-250: 5 units, 251-300: 6 units, 301-350: 7 units, 351-400: 8 units , > 400: 10 units) 10 mL 0  . acetaminophen-codeine (TYLENOL #3) 300-30 MG tablet Take 1 tablet by mouth every 12 (twelve) hours as needed for moderate pain. 60 tablet 1  . clobetasol cream (TEMOVATE) 0.05 % APPLY EXTERNALLY  TO THE AFFECTED AREA TWICE DAILY (Patient not taking: Reported on 08/01/2017) 30 g 0  . ondansetron (ZOFRAN) 4 MG tablet Take 1 tablet (4 mg total) by mouth daily as needed for nausea or vomiting. (Patient not taking: Reported on 12/02/2017) 10 tablet 0  . pantoprazole (PROTONIX) 40 MG tablet Take 1 tablet (40 mg total) by mouth daily. (Patient not taking: Reported on 08/01/2017) 30 tablet 5  . sildenafil (VIAGRA) 50 MG tablet Take 1 tablet (50 mg total) by mouth daily as needed for erectile dysfunction. At least 24 hours between doses (Patient not taking: Reported on 08/01/2017) 30 tablet 3  . atorvastatin (LIPITOR) 20 MG tablet Take 1 tablet (20 mg total) by mouth daily. (Patient not taking: Reported on 12/02/2017) 30 tablet 3  . Pancrelipase, Lip-Prot-Amyl, (ZENPEP) 25000-79000 units CPEP Take 25,000 Units by mouth 3 (three) times daily with meals. (Patient not taking: Reported on 12/02/2017) 90 capsule 3   No facility-administered medications prior to visit.     ROS Review of Systems  Constitutional: Negative for activity change and appetite change.  HENT: Negative for sinus pressure and sore throat.   Eyes: Negative for visual disturbance.  Respiratory: Negative for cough, chest tightness and shortness of breath.   Cardiovascular: Negative for chest pain and leg swelling.  Gastrointestinal: Negative for abdominal distention, abdominal pain, constipation and diarrhea.  Endocrine: Negative.   Genitourinary: Negative for dysuria.  Musculoskeletal:       See hpi  Skin: Negative for rash.  Allergic/Immunologic: Negative.   Neurological: Negative for weakness, light-headedness and numbness.  Psychiatric/Behavioral: Negative for dysphoric mood and suicidal ideas.    Objective:  BP 95/64   Pulse (!) 107   Temp 98.2 F (36.8 C) (Oral)   Ht '5\' 10"'$  (1.778 m)   Wt 131 lb (59.4 kg)   SpO2 100%   BMI 18.80 kg/m   BP/Weight 12/02/2017 09/03/2017 33/82/5053  Systolic BP 95 97 976  Diastolic  BP 64 64 75  Wt. (Lbs) 131 - -  BMI 18.8 18.65 -      Physical Exam  Constitutional: He is oriented to person, place, and time. He appears well-developed and well-nourished.  Cardiovascular: Normal rate, normal heart sounds and intact distal pulses.  No murmur heard. Pulmonary/Chest: Effort normal and breath sounds normal. He has no wheezes. He has no rales. He exhibits no tenderness.  Abdominal: Soft. Bowel sounds are normal. He exhibits no distension and no mass. There is no tenderness.  Musculoskeletal: He exhibits tenderness (TTP and on ROM of both hips).  Neurological: He is alert and oriented to person, place, and time.  Skin:  Hypopigmented patches on dorsum of hands and elbows  Psychiatric: He has a normal mood and affect.    CMP Latest Ref Rng & Units 08/02/2017 08/02/2017 08/01/2017  Glucose 65 - 99 mg/dL 188(H) 99 286(H)  BUN 6 - 20  mg/dL _0 Creatinine 0.61 - 1.24 mg/dL 0.65 0.75 1.04  Sodium 135 - 145 mmol/L 132(L) 134(L) 137  Potassium 3.5 - 5.1 mmol/L 3.2(L) 2.7(LL) 3.8  Chloride 101 - 111 mmol/L 95(L) 93(L) 84(L)  CO2 22 - 32 mmol/L 31 30 33(H)  Calcium 8.9 - 10.3 mg/dL 8.0(L) 8.1(L) 9.5  Total Protein 6.5 - 8.1 g/dL - - 8.1  Total Bilirubin 0.3 - 1.2 mg/dL - - 2.3(H)  Alkaline Phos 38 - 126 U/L - - 137(H)  AST 15 - 41 U/L - - 35  ALT 17 - 63 U/L - - 43    Lipid Panel     Component Value Date/Time   CHOL 134 01/01/2017 0847   TRIG 82 01/01/2017 0847   HDL 44 01/01/2017 0847   CHOLHDL 3.0 01/01/2017 0847   CHOLHDL 2.3 12/19/2015 0947   VLDL 12 12/19/2015 0947   LDLCALC 74 01/01/2017 0847     Assessment & Plan:   1. Type 1 diabetes mellitus with hyperglycemia (HCC) Uncontrolled with A1c of 13.7 Management as per [facility] endocrine Continue diabetic diet, lifestyle modifications He does have microalbuminuria and will benefit from an ACE inhibitor however his soft blood pressure precludes this - POCT glucose (manual entry) - POCT  glycosylated hemoglobin (Hb A1C) - CMP14+EGFR - Microalbumin/Creatinine Ratio, Urine - atorvastatin (LIPITOR) 20 MG tablet; Take 1 tablet (20 mg total) by mouth daily.  Dispense: 30 tablet; Refill: 3  2. Avascular necrosis ([place]) Surgery pending - acetaminophen-codeine (TYLENOL #3) 300-30 MG tablet; Take 1 tablet by mouth every 12 (twelve) hours as needed for moderate pain.  Dispense: 60 tablet; Refill: 1  3. Chronic diarrhea Uncontrolled We will need to see GI Advised to pick up send Pap prescription from the pharmacy - Pancrelipase, Lip-Prot-Amyl, (ZENPEP) 25000-79000 units CPEP; Take 25,000 Units by mouth 3 (three) times daily with meals.  Dispense: 90 capsule; Refill: 3  4. Hypokalemia Last potassium was 3.2 Repeat today  5.  Psoriasis No recent flares  Meds ordered this encounter  Medications  . Pancrelipase, Lip-Prot-Amyl, (ZENPEP) 25000-79000 units CPEP    Sig: Take 25,000 Units by mouth 3 (three) times daily with meals.    Dispense:  90 capsule    Refill:  3  . acetaminophen-codeine (TYLENOL #3) 300-30 MG tablet    Sig: Take 1 tablet by mouth every 12 (twelve) hours as needed for moderate pain.    Dispense:  60 tablet    Refill:  1  . atorvastatin (LIPITOR) 20 MG tablet    Sig: Take 1 tablet (20 mg total) by mouth daily.    Dispense:  30 tablet    Refill:  3    Follow-up: Return in about 3 months (around 03/03/2018) for Follow-up of chronic medical conditions.   Charlott Rakes MD

## 2017-12-02 NOTE — Patient Instructions (Signed)
Diabetes Mellitus and Nutrition When you have diabetes (diabetes mellitus), it is very important to have healthy eating habits because your blood sugar (glucose) levels are greatly affected by what you eat and drink. Eating healthy foods in the appropriate amounts, at about the same times every day, can help you:  Control your blood glucose.  Lower your risk of heart disease.  Improve your blood pressure.  Reach or maintain a healthy weight.  Every person with diabetes is different, and each person has different needs for a meal plan. Your health care provider may recommend that you work with a diet and nutrition specialist (dietitian) to make a meal plan that is best for you. Your meal plan may vary depending on factors such as:  The calories you need.  The medicines you take.  Your weight.  Your blood glucose, blood pressure, and cholesterol levels.  Your activity level.  Other health conditions you have, such as heart or kidney disease.  How do carbohydrates affect me? Carbohydrates affect your blood glucose level more than any other type of food. Eating carbohydrates naturally increases the amount of glucose in your blood. Carbohydrate counting is a method for keeping track of how many carbohydrates you eat. Counting carbohydrates is important to keep your blood glucose at a healthy level, especially if you use insulin or take certain oral diabetes medicines. It is important to know how many carbohydrates you can safely have in each meal. This is different for every person. Your dietitian can help you calculate how many carbohydrates you should have at each meal and for snack. Foods that contain carbohydrates include:  Bread, cereal, rice, pasta, and crackers.  Potatoes and corn.  Peas, beans, and lentils.  Milk and yogurt.  Fruit and juice.  Desserts, such as cakes, cookies, ice cream, and candy.  How does alcohol affect me? Alcohol can cause a sudden decrease in blood  glucose (hypoglycemia), especially if you use insulin or take certain oral diabetes medicines. Hypoglycemia can be a life-threatening condition. Symptoms of hypoglycemia (sleepiness, dizziness, and confusion) are similar to symptoms of having too much alcohol. If your health care provider says that alcohol is safe for you, follow these guidelines:  Limit alcohol intake to no more than 1 drink per day for nonpregnant women and 2 drinks per day for men. One drink equals 12 oz of beer, 5 oz of wine, or 1 oz of hard liquor.  Do not drink on an empty stomach.  Keep yourself hydrated with water, diet soda, or unsweetened iced tea.  Keep in mind that regular soda, juice, and other mixers may contain a lot of sugar and must be counted as carbohydrates.  What are tips for following this plan? Reading food labels  Start by checking the serving size on the label. The amount of calories, carbohydrates, fats, and other nutrients listed on the label are based on one serving of the food. Many foods contain more than one serving per package.  Check the total grams (g) of carbohydrates in one serving. You can calculate the number of servings of carbohydrates in one serving by dividing the total carbohydrates by 15. For example, if a food has 30 g of total carbohydrates, it would be equal to 2 servings of carbohydrates.  Check the number of grams (g) of saturated and trans fats in one serving. Choose foods that have low or no amount of these fats.  Check the number of milligrams (mg) of sodium in one serving. Most people   should limit total sodium intake to less than 2,300 mg per day.  Always check the nutrition information of foods labeled as "low-fat" or "nonfat". These foods may be higher in added sugar or refined carbohydrates and should be avoided.  Talk to your dietitian to identify your daily goals for nutrients listed on the label. Shopping  Avoid buying canned, premade, or processed foods. These  foods tend to be high in fat, sodium, and added sugar.  Shop around the outside edge of the grocery store. This includes fresh fruits and vegetables, bulk grains, fresh meats, and fresh dairy. Cooking  Use low-heat cooking methods, such as baking, instead of high-heat cooking methods like deep frying.  Cook using healthy oils, such as olive, canola, or sunflower oil.  Avoid cooking with butter, cream, or high-fat meats. Meal planning  Eat meals and snacks regularly, preferably at the same times every day. Avoid going long periods of time without eating.  Eat foods high in fiber, such as fresh fruits, vegetables, beans, and whole grains. Talk to your dietitian about how many servings of carbohydrates you can eat at each meal.  Eat 4-6 ounces of lean protein each day, such as lean meat, chicken, fish, eggs, or tofu. 1 ounce is equal to 1 ounce of meat, chicken, or fish, 1 egg, or 1/4 cup of tofu.  Eat some foods each day that contain healthy fats, such as avocado, nuts, seeds, and fish. Lifestyle   Check your blood glucose regularly.  Exercise at least 30 minutes 5 or more days each week, or as told by your health care provider.  Take medicines as told by your health care provider.  Do not use any products that contain nicotine or tobacco, such as cigarettes and e-cigarettes. If you need help quitting, ask your health care provider.  Work with a counselor or diabetes educator to identify strategies to manage stress and any emotional and social challenges. What are some questions to ask my health care provider?  Do I need to meet with a diabetes educator?  Do I need to meet with a dietitian?  What number can I call if I have questions?  When are the best times to check my blood glucose? Where to find more information:  American Diabetes Association: diabetes.org/food-and-fitness/food  Academy of Nutrition and Dietetics:  www.eatright.org/resources/health/diseases-and-conditions/diabetes  National Institute of Diabetes and Digestive and Kidney Diseases (NIH): www.niddk.nih.gov/health-information/diabetes/overview/diet-eating-physical-activity Summary  A healthy meal plan will help you control your blood glucose and maintain a healthy lifestyle.  Working with a diet and nutrition specialist (dietitian) can help you make a meal plan that is best for you.  Keep in mind that carbohydrates and alcohol have immediate effects on your blood glucose levels. It is important to count carbohydrates and to use alcohol carefully. This information is not intended to replace advice given to you by your health care provider. Make sure you discuss any questions you have with your health care provider. Document Released: 05/01/2005 Document Revised: 09/08/2016 Document Reviewed: 09/08/2016 Elsevier Interactive Patient Education  2018 Elsevier Inc.  

## 2017-12-03 LAB — MICROALBUMIN / CREATININE URINE RATIO
Creatinine, Urine: 315.5 mg/dL
MICROALB/CREAT RATIO: 153.4 mg/g{creat} — AB (ref 0.0–30.0)
MICROALBUM., U, RANDOM: 484.1 ug/mL

## 2017-12-03 LAB — CMP14+EGFR
ALT: 23 IU/L (ref 0–44)
AST: 20 IU/L (ref 0–40)
Albumin/Globulin Ratio: 2 (ref 1.2–2.2)
Albumin: 4.7 g/dL (ref 3.5–5.5)
Alkaline Phosphatase: 127 IU/L — ABNORMAL HIGH (ref 39–117)
BUN/Creatinine Ratio: 7 — ABNORMAL LOW (ref 9–20)
BUN: 6 mg/dL (ref 6–24)
Bilirubin Total: 1.1 mg/dL (ref 0.0–1.2)
CALCIUM: 10 mg/dL (ref 8.7–10.2)
CO2: 28 mmol/L (ref 20–29)
CREATININE: 0.89 mg/dL (ref 0.76–1.27)
Chloride: 96 mmol/L (ref 96–106)
GFR, EST AFRICAN AMERICAN: 123 mL/min/{1.73_m2} (ref >59)
GFR, EST NON AFRICAN AMERICAN: 106 mL/min/{1.73_m2} (ref >59)
Globulin, Total: 2.3 g/dL (ref 1.5–4.5)
Glucose: 104 mg/dL — ABNORMAL HIGH (ref 65–99)
Potassium: 4.7 mmol/L (ref 3.5–5.2)
Sodium: 137 mmol/L (ref 134–144)
TOTAL PROTEIN: 7 g/dL (ref 6.0–8.5)

## 2017-12-08 ENCOUNTER — Other Ambulatory Visit: Payer: Self-pay | Admitting: Family Medicine

## 2017-12-08 DIAGNOSIS — K529 Noninfective gastroenteritis and colitis, unspecified: Secondary | ICD-10-CM

## 2017-12-08 MED ORDER — PANCRELIPASE (LIP-PROT-AMYL) 20000-63000 UNITS PO CPEP
20000.0000 [IU] | ORAL_CAPSULE | Freq: Three times a day (TID) | ORAL | 3 refills | Status: DC
Start: 2017-12-08 — End: 2018-07-19

## 2017-12-08 MED FILL — !ZENPEP 20000-63000 UNIT CA: 20000-63000 | 30 days supply | Qty: 90 | Fill #0

## 2017-12-15 ENCOUNTER — Other Ambulatory Visit: Payer: Self-pay | Admitting: Family Medicine

## 2017-12-15 DIAGNOSIS — E1065 Type 1 diabetes mellitus with hyperglycemia: Secondary | ICD-10-CM

## 2017-12-15 MED FILL — !LANTUS 100 UNITS/ML VIAL: 100 | 25 days supply | Qty: 10 | Fill #0

## 2017-12-22 ENCOUNTER — Other Ambulatory Visit: Payer: Self-pay | Admitting: Internal Medicine

## 2017-12-22 DIAGNOSIS — E1065 Type 1 diabetes mellitus with hyperglycemia: Secondary | ICD-10-CM

## 2018-01-14 MED FILL — ACETAMINOPHEN/COD #3 TABLET: 300-30 | 30 days supply | Qty: 60 | Fill #0

## 2018-01-14 MED FILL — !LANTUS 100 UNITS/ML VIAL: 100 | 25 days supply | Qty: 10 | Fill #1

## 2018-02-11 MED FILL — !LANTUS 100 UNITS/ML VIAL: 100 | 25 days supply | Qty: 10 | Fill #2

## 2018-03-03 ENCOUNTER — Ambulatory Visit: Payer: Medicaid Other | Admitting: Family Medicine

## 2018-03-10 MED FILL — $LANTUS 100 UNITS/ML VIAL: 100 | 25 days supply | Qty: 10 | Fill #0

## 2018-04-27 MED FILL — $novoLOG 100 UNITS/ML VIAL: 100 | 27 days supply | Qty: 10 | Fill #0

## 2018-04-27 MED FILL — $LANTUS 100 UNITS/ML VIAL: 100 | 25 days supply | Qty: 10 | Fill #1

## 2018-06-03 MED FILL — $LANTUS 100 UNITS/ML VIAL: 100 | 25 days supply | Qty: 10 | Fill #2

## 2018-07-19 ENCOUNTER — Ambulatory Visit: Payer: Medicare Other | Attending: Family Medicine | Admitting: Family Medicine

## 2018-07-19 ENCOUNTER — Encounter: Payer: Self-pay | Admitting: Family Medicine

## 2018-07-19 VITALS — BP 109/74 | HR 95 | Temp 98.3°F | Ht 70.0 in | Wt 124.0 lb

## 2018-07-19 DIAGNOSIS — K529 Noninfective gastroenteritis and colitis, unspecified: Secondary | ICD-10-CM | POA: Insufficient documentation

## 2018-07-19 DIAGNOSIS — E1065 Type 1 diabetes mellitus with hyperglycemia: Secondary | ICD-10-CM | POA: Insufficient documentation

## 2018-07-19 DIAGNOSIS — K219 Gastro-esophageal reflux disease without esophagitis: Secondary | ICD-10-CM | POA: Insufficient documentation

## 2018-07-19 DIAGNOSIS — Z794 Long term (current) use of insulin: Secondary | ICD-10-CM | POA: Diagnosis not present

## 2018-07-19 DIAGNOSIS — M8700 Idiopathic aseptic necrosis of unspecified bone: Secondary | ICD-10-CM | POA: Diagnosis not present

## 2018-07-19 DIAGNOSIS — I96 Gangrene, not elsewhere classified: Secondary | ICD-10-CM | POA: Diagnosis not present

## 2018-07-19 DIAGNOSIS — Z79899 Other long term (current) drug therapy: Secondary | ICD-10-CM | POA: Insufficient documentation

## 2018-07-19 DIAGNOSIS — Z9119 Patient's noncompliance with other medical treatment and regimen: Secondary | ICD-10-CM | POA: Diagnosis not present

## 2018-07-19 DIAGNOSIS — E1029 Type 1 diabetes mellitus with other diabetic kidney complication: Secondary | ICD-10-CM | POA: Diagnosis not present

## 2018-07-19 DIAGNOSIS — Z880 Allergy status to penicillin: Secondary | ICD-10-CM | POA: Insufficient documentation

## 2018-07-19 LAB — POCT GLYCOSYLATED HEMOGLOBIN (HGB A1C): HEMOGLOBIN A1C: 11.4 % — AB (ref 4.0–5.6)

## 2018-07-19 LAB — GLUCOSE, POCT (MANUAL RESULT ENTRY): POC GLUCOSE: 325 mg/dL — AB (ref 70–99)

## 2018-07-19 MED ORDER — NOVOLOG 100 UNIT/ML ~~LOC~~ SOLN: SUBCUTANEOUS | 0 refills | Status: DC

## 2018-07-19 MED ORDER — ACCU-CHEK SOFTCLIX LANCETS MISC: 5 refills | Status: DC

## 2018-07-19 MED ORDER — ATORVASTATIN CALCIUM 20 MG PO TABS: 20.0000 mg | ORAL_TABLET | Freq: Every day | ORAL | 3 refills | Status: DC

## 2018-07-19 MED ORDER — PANTOPRAZOLE SODIUM 40 MG PO TBEC: 40.0000 mg | DELAYED_RELEASE_TABLET | Freq: Every day | ORAL | 5 refills | Status: DC

## 2018-07-19 MED ORDER — ACETAMINOPHEN-CODEINE #3 300-30 MG PO TABS: 1.0000 | ORAL_TABLET | Freq: Two times a day (BID) | ORAL | 1 refills | Status: DC | PRN

## 2018-07-19 MED ORDER — INSULIN GLARGINE 100 UNIT/ML ~~LOC~~ SOLN: 15.0000 [IU] | Freq: Every day | SUBCUTANEOUS | 3 refills | Status: DC

## 2018-07-19 NOTE — Patient Instructions (Signed)
Diabetes Mellitus and Nutrition When you have diabetes (diabetes mellitus), it is very important to have healthy eating habits because your blood sugar (glucose) levels are greatly affected by what you eat and drink. Eating healthy foods in the appropriate amounts, at about the same times every day, can help you:  Control your blood glucose.  Lower your risk of heart disease.  Improve your blood pressure.  Reach or maintain a healthy weight.  Every person with diabetes is different, and each person has different needs for a meal plan. Your health care provider may recommend that you work with a diet and nutrition specialist (dietitian) to make a meal plan that is best for you. Your meal plan may vary depending on factors such as:  The calories you need.  The medicines you take.  Your weight.  Your blood glucose, blood pressure, and cholesterol levels.  Your activity level.  Other health conditions you have, such as heart or kidney disease.  How do carbohydrates affect me? Carbohydrates affect your blood glucose level more than any other type of food. Eating carbohydrates naturally increases the amount of glucose in your blood. Carbohydrate counting is a method for keeping track of how many carbohydrates you eat. Counting carbohydrates is important to keep your blood glucose at a healthy level, especially if you use insulin or take certain oral diabetes medicines. It is important to know how many carbohydrates you can safely have in each meal. This is different for every person. Your dietitian can help you calculate how many carbohydrates you should have at each meal and for snack. Foods that contain carbohydrates include:  Bread, cereal, rice, pasta, and crackers.  Potatoes and corn.  Peas, beans, and lentils.  Milk and yogurt.  Fruit and juice.  Desserts, such as cakes, cookies, ice cream, and candy.  How does alcohol affect me? Alcohol can cause a sudden decrease in blood  glucose (hypoglycemia), especially if you use insulin or take certain oral diabetes medicines. Hypoglycemia can be a life-threatening condition. Symptoms of hypoglycemia (sleepiness, dizziness, and confusion) are similar to symptoms of having too much alcohol. If your health care provider says that alcohol is safe for you, follow these guidelines:  Limit alcohol intake to no more than 1 drink per day for nonpregnant women and 2 drinks per day for men. One drink equals 12 oz of beer, 5 oz of wine, or 1 oz of hard liquor.  Do not drink on an empty stomach.  Keep yourself hydrated with water, diet soda, or unsweetened iced tea.  Keep in mind that regular soda, juice, and other mixers may contain a lot of sugar and must be counted as carbohydrates.  What are tips for following this plan? Reading food labels  Start by checking the serving size on the label. The amount of calories, carbohydrates, fats, and other nutrients listed on the label are based on one serving of the food. Many foods contain more than one serving per package.  Check the total grams (g) of carbohydrates in one serving. You can calculate the number of servings of carbohydrates in one serving by dividing the total carbohydrates by 15. For example, if a food has 30 g of total carbohydrates, it would be equal to 2 servings of carbohydrates.  Check the number of grams (g) of saturated and trans fats in one serving. Choose foods that have low or no amount of these fats.  Check the number of milligrams (mg) of sodium in one serving. Most people   should limit total sodium intake to less than 2,300 mg per day.  Always check the nutrition information of foods labeled as "low-fat" or "nonfat". These foods may be higher in added sugar or refined carbohydrates and should be avoided.  Talk to your dietitian to identify your daily goals for nutrients listed on the label. Shopping  Avoid buying canned, premade, or processed foods. These  foods tend to be high in fat, sodium, and added sugar.  Shop around the outside edge of the grocery store. This includes fresh fruits and vegetables, bulk grains, fresh meats, and fresh dairy. Cooking  Use low-heat cooking methods, such as baking, instead of high-heat cooking methods like deep frying.  Cook using healthy oils, such as olive, canola, or sunflower oil.  Avoid cooking with butter, cream, or high-fat meats. Meal planning  Eat meals and snacks regularly, preferably at the same times every day. Avoid going long periods of time without eating.  Eat foods high in fiber, such as fresh fruits, vegetables, beans, and whole grains. Talk to your dietitian about how many servings of carbohydrates you can eat at each meal.  Eat 4-6 ounces of lean protein each day, such as lean meat, chicken, fish, eggs, or tofu. 1 ounce is equal to 1 ounce of meat, chicken, or fish, 1 egg, or 1/4 cup of tofu.  Eat some foods each day that contain healthy fats, such as avocado, nuts, seeds, and fish. Lifestyle   Check your blood glucose regularly.  Exercise at least 30 minutes 5 or more days each week, or as told by your health care provider.  Take medicines as told by your health care provider.  Do not use any products that contain nicotine or tobacco, such as cigarettes and e-cigarettes. If you need help quitting, ask your health care provider.  Work with a counselor or diabetes educator to identify strategies to manage stress and any emotional and social challenges. What are some questions to ask my health care provider?  Do I need to meet with a diabetes educator?  Do I need to meet with a dietitian?  What number can I call if I have questions?  When are the best times to check my blood glucose? Where to find more information:  American Diabetes Association: diabetes.org/food-and-fitness/food  Academy of Nutrition and Dietetics:  www.eatright.org/resources/health/diseases-and-conditions/diabetes  National Institute of Diabetes and Digestive and Kidney Diseases (NIH): www.niddk.nih.gov/health-information/diabetes/overview/diet-eating-physical-activity Summary  A healthy meal plan will help you control your blood glucose and maintain a healthy lifestyle.  Working with a diet and nutrition specialist (dietitian) can help you make a meal plan that is best for you.  Keep in mind that carbohydrates and alcohol have immediate effects on your blood glucose levels. It is important to count carbohydrates and to use alcohol carefully. This information is not intended to replace advice given to you by your health care provider. Make sure you discuss any questions you have with your health care provider. Document Released: 05/01/2005 Document Revised: 09/08/2016 Document Reviewed: 09/08/2016 Elsevier Interactive Patient Education  2018 Elsevier Inc.  

## 2018-07-20 MED FILL — $LANTUS 100 UNITS/ML VIAL: 100 | 84 days supply | Qty: 30 | Fill #0

## 2018-07-20 MED FILL — ATORVASTATIN 20 MG TABLET: 20 | 30 days supply | Qty: 30 | Fill #0

## 2018-07-20 MED FILL — PANTOPRAZOLE SOD DR 40 MG T: 40 | 30 days supply | Qty: 30 | Fill #0

## 2018-07-20 MED FILL — $novoLOG 100 UNITS/ML VIAL: 100 | 27 days supply | Qty: 10 | Fill #0

## 2018-07-20 NOTE — Progress Notes (Signed)
Subjective:  Patient ID: Jared Murray, male    DOB: 1975-08-30  Age: 42 y.o. MRN: [id number]  CC: Diabetes   HPI Jared Murray is a 42 year old male with a history of uncontrolled type 1 diabetes mellitus (A1c 11.4), noncompliance, multiple hospital admissions for DKA, avascular necrosis of both hips, Psoriasis who comes into the clinic for a follow-up visit. Last seen by [facility] endocrinology in 09/2017 after which he lost his Medicaid but informs me he now has Medicare.  Compliance with his medication and diabetic diet is questionable as he gives conflicting answers when asked. He does have chronic diarrhea and was placed on Zenpep even though his stool studies came back negative but he states Zenpep worsened his diarrhea and symptoms improved when he discontinued it. He complains of increasing reflux symptoms after a quiet period of one year when he was asymptomatic; he would like a refill of his PPI. Also requesting refill Tylenol 3 for the avascular necrosis of both hips as he is unable to undergo surgery due to poorly controlled diabetes mellitus as per orthopedics.  Past Medical History:  Diagnosis Date  . DKA (diabetic ketoacidoses) ([place])   . Eczema   . GERD (gastroesophageal reflux disease)   . Psoriasis   . Substance abuse ([place])   . Type 1 diabetes mellitus ([place])     Past Surgical History:  Procedure Laterality Date  . FINGER SURGERY      Allergies  Allergen Reactions  . Latex Itching and Rash  . Penicillins Other (See Comments)    Childhood allergy Has patient had a PCN reaction causing immediate rash, facial/tongue/throat swelling, SOB or lightheadedness with hypotension: NO Has patient had a PCN reaction causing severe rash involving mucus membranes or skin necrosis:NO Has patient had a PCN reaction that required hospitalization NO Has patient had a PCN reaction occurring within the last 10 years: NO If all of the above answers are "NO", then may proceed with  Cephalosporin use.     Outpatient Medications Prior to Visit  Medication Sig Dispense Refill  . alum & mag hydroxide-simeth (MAALOX/MYLANTA) 200-200-20 MG/5ML suspension Take 15 mLs by mouth every 6 (six) hours as needed for indigestion or heartburn. 355 mL 0  . aspirin-sod bicarb-citric acid (ALKA-SELTZER) 325 MG TBEF tablet Take 325 mg by mouth every 6 (six) hours as needed (heartburn).    Marland Kitchen glucose blood (ACCU-CHEK AVIVA PLUS) test strip USE TO CHECK BLOOD SUGAR FOUR TIMES DAILY BEFORE MEALS AND AT BEDTIME 120 each 5  . Insulin Syringe-Needle U-100 (B-D INS SYRINGE 2CC/29GX1/2") 29G X 1/2" 2 ML MISC Check Blood sugar TID and QHS 100 each 12  . ACCU-CHEK SOFTCLIX LANCETS lancets USE AS INSTRUCTED THREE TIMES DAILY 100 each 5  . acetaminophen-codeine (TYLENOL #3) 300-30 MG tablet Take 1 tablet by mouth every 12 (twelve) hours as needed for moderate pain. 60 tablet 1  . atorvastatin (LIPITOR) 20 MG tablet Take 1 tablet (20 mg total) by mouth daily. 30 tablet 3  . insulin aspart (NOVOLOG) 100 UNIT/ML injection Inject 0-12 Units into the skin 3 (three) times daily with meals. As per sliding scale 10 mL 0  . LANTUS 100 UNIT/ML injection INJECT 40 UNITS INTO THE SKIN AT BEDTIME. 30 mL 0  . pantoprazole (PROTONIX) 40 MG tablet Take 1 tablet (40 mg total) by mouth daily. 30 tablet 5  . clobetasol cream (TEMOVATE) 0.05 % APPLY EXTERNALLY TO THE AFFECTED AREA TWICE DAILY (Patient not taking: Reported on 08/01/2017)  30 g 0  . ondansetron (ZOFRAN) 4 MG tablet Take 1 tablet (4 mg total) by mouth daily as needed for nausea or vomiting. (Patient not taking: Reported on 12/02/2017) 10 tablet 0  . sildenafil (VIAGRA) 50 MG tablet Take 1 tablet (50 mg total) by mouth daily as needed for erectile dysfunction. At least 24 hours between doses (Patient not taking: Reported on 08/01/2017) 30 tablet 3  . NOVOLOG 100 UNIT/ML injection INJECT 0 TO 12 UNITS INTO THE SKIN THREE TIMES DAILY WITH MEALS AS PER SLIDING SCALE  (Patient not taking: Reported on 07/19/2018) 10 mL 0  . Pancrelipase, Lip-Prot-Amyl, (ZENPEP) 20000-63000 units CPEP Take 20,000 Units by mouth 3 (three) times daily with meals. (Patient not taking: Reported on 07/19/2018) 90 capsule 3   No facility-administered medications prior to visit.     ROS Review of Systems  Constitutional: Negative for activity change and appetite change.  HENT: Negative for sinus pressure and sore throat.   Eyes: Negative for visual disturbance.  Respiratory: Negative for cough, chest tightness and shortness of breath.   Cardiovascular: Negative for chest pain and leg swelling.  Gastrointestinal: Positive for diarrhea. Negative for abdominal distention, abdominal pain and constipation.  Endocrine: Negative.   Genitourinary: Negative for dysuria.  Musculoskeletal: Negative for joint swelling and myalgias.  Skin: Negative for rash.  Allergic/Immunologic: Negative.   Neurological: Negative for weakness, light-headedness and numbness.  Psychiatric/Behavioral: Negative for dysphoric mood and suicidal ideas.    Objective:  BP 109/74   Pulse 95   Temp 98.3 F (36.8 C) (Oral)   Ht '5\' 10"'  (1.778 m)   Wt 124 lb (56.2 kg)   SpO2 98%   BMI 17.79 kg/m   BP/Weight 07/19/2018 12/02/2017 8/75/6433  Systolic BP 295 95 97  Diastolic BP 74 64 64  Wt. (Lbs) 124 131 -  BMI 17.79 18.8 18.65      Physical Exam  Constitutional: He is oriented to person, place, and time.  Thin  Cardiovascular: Normal rate, normal heart sounds and intact distal pulses.  No murmur heard. Pulmonary/Chest: Effort normal and breath sounds normal. He has no wheezes. He has no rales. He exhibits no tenderness.  Abdominal: Soft. Bowel sounds are normal. He exhibits no distension and no mass. There is no tenderness.  Musculoskeletal: Normal range of motion.  Neurological: He is alert and oriented to person, place, and time.  Skin: Skin is warm and dry.  Psychiatric: He has a normal mood and  affect.     Lab Results  Component Value Date   HGBA1C 11.4 (A) 07/19/2018    Assessment & Plan:   1. 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 Dx: Type 1 DM  Dispense: 100 each; Refill: 5  2. Type 1 diabetes mellitus with hyperglycemia (HCC) Emphasized the need to comply with a diabetic diet Compliance with medications major hindrance - insulin glargine (LANTUS) 100 UNIT/ML injection; Inject 0.15 mLs (15 Units total) into the skin at bedtime.  Dispense: 30 mL; Refill: 3 - NOVOLOG 100 UNIT/ML injection; INJECT 0 TO 12 UNITS INTO THE SKIN THREE TIMES DAILY WITH MEALS AS PER SLIDING SCALE  Dispense: 10 mL; Refill: 0 - atorvastatin (  LIPITOR) 20 MG tablet; Take 1 tablet (20 mg total) by mouth daily.  Dispense: 30 tablet; Refill: 3 - CMP14+EGFR; Future - Lipid panel; Future  3. Avascular necrosis ([place]) Surgery on hold as per orthopedics until optimal glycemic control - acetaminophen-codeine (TYLENOL #3) 300-30 MG tablet; Take 1 tablet by mouth every 12 (twelve) hours as needed for moderate pain. Dx:P avascular necrosis of the hip  Dispense: 60 tablet; Refill: 1  4. Chronic diarrhea of unknown origin Tried Zenpep with no improvement in symptoms Labs for pancreatic insufficiency came back negative in the past - Ambulatory referral to Gastroenterology  5.  GERD Refilled PPI  Meds ordered this encounter  Medications  . insulin glargine (LANTUS) 100 UNIT/ML injection    Sig: Inject 0.15 mLs (15 Units total) into the skin at bedtime.    Dispense:  30 mL    Refill:  3  . NOVOLOG 100 UNIT/ML injection    Sig: INJECT 0 TO 12 UNITS INTO THE  SKIN THREE TIMES DAILY WITH MEALS AS PER SLIDING SCALE    Dispense:  10 mL    Refill:  0    Please d/c any previous Novolog scripts  . pantoprazole (PROTONIX) 40 MG tablet    Sig: Take 1 tablet (40 mg total) by mouth daily.    Dispense:  30 tablet    Refill:  5  . acetaminophen-codeine (TYLENOL #3) 300-30 MG tablet    Sig: Take 1 tablet by mouth every 12 (twelve) hours as needed for moderate pain. Dx:P avascular necrosis of the hip    Dispense:  60 tablet    Refill:  1  . atorvastatin (LIPITOR) 20 MG tablet    Sig: Take 1 tablet (20 mg total) by mouth daily.    Dispense:  30 tablet    Refill:  3  . ACCU-CHEK SOFTCLIX LANCETS lancets    Sig: Use as instructed 3 times daily. Dx: Type 1 DM    Dispense:  100 each    Refill:  5    Follow-up: Return in about 3 months (around 10/18/2018) for Follow-up of chronic medical conditions.   Charlott Rakes MD

## 2018-07-26 ENCOUNTER — Other Ambulatory Visit: Payer: Medicaid Other

## 2018-07-27 ENCOUNTER — Encounter: Payer: Self-pay | Admitting: Gastroenterology

## 2018-08-16 ENCOUNTER — Other Ambulatory Visit: Payer: Self-pay | Admitting: Pharmacist

## 2018-08-16 ENCOUNTER — Ambulatory Visit: Payer: Medicare Other | Attending: Family Medicine | Admitting: Pharmacist

## 2018-08-16 ENCOUNTER — Encounter: Payer: Self-pay | Admitting: Pharmacist

## 2018-08-16 DIAGNOSIS — E1029 Type 1 diabetes mellitus with other diabetic kidney complication: Secondary | ICD-10-CM | POA: Diagnosis not present

## 2018-08-16 DIAGNOSIS — E108 Type 1 diabetes mellitus with unspecified complications: Secondary | ICD-10-CM

## 2018-08-16 MED ORDER — GLUCOSE BLOOD VI STRP: ORAL_STRIP | 11 refills | Status: DC

## 2018-08-16 MED ORDER — ACCU-CHEK SOFTCLIX LANCETS MISC: 5 refills | Status: DC

## 2018-08-16 NOTE — Progress Notes (Signed)
Patient was educated on the use of the AccuChek blood glucose meter. Reviewed necessary supplies and operation of the meter. Also reviewed goal blood glucose levels. Patient was able to demonstrate use. All questions and concerns were addressed.   

## 2018-08-18 HISTORY — PX: COLONOSCOPY: SHX174

## 2018-08-30 ENCOUNTER — Ambulatory Visit (INDEPENDENT_AMBULATORY_CARE_PROVIDER_SITE_OTHER): Payer: Medicare Other | Admitting: Gastroenterology

## 2018-08-30 ENCOUNTER — Encounter: Payer: Self-pay | Admitting: Gastroenterology

## 2018-08-30 VITALS — BP 90/70 | HR 92 | Ht 69.25 in | Wt 128.5 lb

## 2018-08-30 DIAGNOSIS — K219 Gastro-esophageal reflux disease without esophagitis: Secondary | ICD-10-CM

## 2018-08-30 DIAGNOSIS — K529 Noninfective gastroenteritis and colitis, unspecified: Secondary | ICD-10-CM | POA: Diagnosis not present

## 2018-08-30 MED ORDER — SUPREP BOWEL PREP KIT 17.5-3.13-1.6 GM/177ML PO SOLN: ORAL | 0 refills | Status: DC

## 2018-08-30 MED FILL — SUPREP BOWEL PREP KIT: 17.5-3.13-1 | 1 days supply | Qty: 354 | Fill #0

## 2018-08-30 NOTE — Progress Notes (Signed)
HPI :  43 y/o male with a history of type I DM, GERD, chronic loose stools, referred here by Charlott Rakes, MD for chronic diarrhea and GERD.  The patient reports for the past year or so he's had chronic loose stools. He usually has at least 2 loose stools per day, and then he reports having some nocturnal stools as well. He has episodes of mild incontinence at night. He endorses significant urgency with his bowels. He denies any abdominal pains but does endorse some discomfort with urgency of a bowel movement. He does not have any blood in his stools. No family history of colon cancer. His brother had prostate cancer. He is use Imodium over-the-counter which hasn't really helped too much. He had prior stool testing as below, negative for C. Difficile at the end of 07/2017 and normal fecal elastase and fecal fat. He had a CT scan in December 2018 showing borderline diffuse bowel wall thickening.  He otherwise has had a history of reflux which bothers him periodically. He takes Protonix once a day dosed at 40 mg and this works quite well to control his symptoms. He denies any dysphagia or odynophagia. No nausea or vomiting, no postprandial symptoms. He denies any NSAID use.  Prior workup: Pancreatic fecal elastase on 09/08/17 was > 500, fecal fat normal C diff PCR negative 08/02/17  CT abdomen 08/01/2017 - boderline diffuse bowel wall thickening  Past Medical History:  Diagnosis Date  . Arthritis   . DKA (diabetic ketoacidoses) ([place])   . Eczema   . GERD (gastroesophageal reflux disease)   . Psoriasis   . Substance abuse ([place])   . Type 1 diabetes mellitus ([place])      Past Surgical History:  Procedure Laterality Date  . FINGER SURGERY Right    middle finger   Family History  Problem Relation Age of Onset  . Hypertension Father   . Diabetes Other        multiple  . Lupus Cousin   . Stroke Maternal Grandmother   . Stroke Paternal Grandmother    Social History   Tobacco Use  .  Smoking status: Current Some Day Smoker    Packs/day: 0.25    Years: 10.00    Pack years: 2.50    Types: Cigarettes  . Smokeless tobacco: Never Used  Substance Use Topics  . Alcohol use: Yes    Alcohol/week: 1.0 - 2.0 standard drinks    Types: 1 - 2 Shots of liquor per week    Comment: last time tuesday  . Drug use: Yes    Types: Marijuana    Comment: smoked about 2 weeks ago   Current Outpatient Medications  Medication Sig Dispense Refill  . ACCU-CHEK SOFTCLIX LANCETS lancets Use as instructed 3 times daily. Dx: Type 1 DM 100 each 5  . acetaminophen-codeine (TYLENOL #3) 300-30 MG tablet Take 1 tablet by mouth every 12 (twelve) hours as needed for moderate pain. Dx:P avascular necrosis of the hip 60 tablet 1  . aspirin-sod bicarb-citric acid (ALKA-SELTZER) 325 MG TBEF tablet Take 325 mg by mouth every 6 (six) hours as needed (heartburn).    Marland Kitchen atorvastatin (LIPITOR) 20 MG tablet Take 1 tablet (20 mg total) by mouth daily. 30 tablet 3  . clobetasol cream (TEMOVATE) 0.05 % APPLY EXTERNALLY TO THE AFFECTED AREA TWICE DAILY 30 g 0  . glucose blood (ACCU-CHEK AVIVA PLUS) test strip USE TO CHECK BLOOD SUGAR FOUR TIMES DAILY BEFORE MEALS AND AT BEDTIME 100 each 11  .  insulin glargine (LANTUS) 100 UNIT/ML injection Inject 0.15 mLs (15 Units total) into the skin at bedtime. 30 mL 3  . Insulin Syringe-Needle U-100 (B-D INS SYRINGE 2CC/29GX1/2") 29G X 1/2" 2 ML MISC Check Blood sugar TID and QHS 100 each 12  . NOVOLOG 100 UNIT/ML injection INJECT 0 TO 12 UNITS INTO THE SKIN THREE TIMES DAILY WITH MEALS AS PER SLIDING SCALE 10 mL 0  . pantoprazole (PROTONIX) 40 MG tablet Take 1 tablet (40 mg total) by mouth daily. 30 tablet 5  . SUPREP BOWEL PREP KIT 17.5-3.13-1.6 GM/177ML SOLN Suprep-Use as directed 354 mL 0   No current facility-administered medications for this visit.    Allergies  Allergen Reactions  . Bee Venom   . Latex Itching and Rash  . Penicillins Other (See Comments)    Childhood  allergy Has patient had a PCN reaction causing immediate rash, facial/tongue/throat swelling, SOB or lightheadedness with hypotension: NO Has patient had a PCN reaction causing severe rash involving mucus membranes or skin necrosis:NO Has patient had a PCN reaction that required hospitalization NO Has patient had a PCN reaction occurring within the last 10 years: NO If all of the above answers are "NO", then may proceed with Cephalosporin use.     Review of Systems: All systems reviewed and negative except where noted in HPI.   Lab Results  Component Value Date   WBC 6.6 08/01/2017   HGB 14.0 08/01/2017   HCT 40.3 08/01/2017   MCV 88.2 08/01/2017   PLT 416 (H) 08/01/2017    Lab Results  Component Value Date   CREATININE 0.89 12/02/2017   BUN 6 12/02/2017   NA 137 12/02/2017   K 4.7 12/02/2017   CL 96 12/02/2017   CO2 28 12/02/2017    Lab Results  Component Value Date   ALT 23 12/02/2017   AST 20 12/02/2017   ALKPHOS 127 (H) 12/02/2017   BILITOT 1.1 12/02/2017     Physical Exam: BP 90/70 (BP Location: Left Arm, Patient Position: Sitting, Cuff Size: Normal)   Pulse 92   Ht 5' 9.25" (1.759 m) Comment: height measured without shoes  Wt 128 lb 8 oz (58.3 kg)   BMI 18.84 kg/m  Constitutional: Pleasant, male in no acute distress, using walker to ambulate HEENT: Normocephalic and atraumatic. Conjunctivae are normal. No scleral icterus. Neck supple.  Cardiovascular: Normal rate, regular rhythm.  Pulmonary/chest: Effort normal and breath sounds normal. No wheezing, rales or rhonchi. Abdominal: Soft, nondistended, nontender. . There are no masses palpable. No hepatomegaly. Extremities: no edema Lymphadenopathy: No cervical adenopathy noted. Neurological: Alert and oriented to person place and time. Skin: Skin is warm and dry. No rashes noted. Psychiatric: Normal mood and affect. Behavior is normal.   ASSESSMENT AND PLAN: 43 year old male here for new patient visit to  discuss the following:  Chronic diarrhea - one year of multiple loose stools a day with some urgency, occasional incontinence. Pancreatic fecal elastase and fecal fat normal. Prior testing for C. Difficile negative. Discussed options with him. Recommend colonoscopy to further evaluate and ensure no evidence of IBD given his CT scan showing some questionable thickening of his colon. If this is normal will also biopsy to rule out microscopic colitis. I discussed risks and benefits of colonoscopy and he wanted to proceed. Further recommendations pending the results. He agreed with the plan.  GERD - doing well on current dosing of Protonix, no alarm symptoms, long-term recommend the lowest-dose dose needed to control symptoms. In the future  he may benefit from screening endoscopy for Barrett's after age 31 given longstanding symptoms.   Ken[place]ick Cellar, MD [facility]  CC: Charlott Rakes, MD

## 2018-08-30 NOTE — Patient Instructions (Signed)
If you are age 43 or older, your body mass index should be between 23-30. Your Body mass index is 18.84 kg/m. If this is out of the aforementioned range listed, please consider follow up with your Primary Care Provider.  If you are age 61 or younger, your body mass index should be between 19-25. Your Body mass index is 18.84 kg/m. If this is out of the aformentioned range listed, please consider follow up with your Primary Care Provider.   You have been scheduled for a colonoscopy. Please follow written instructions given to you at your visit today.  Please pick up your prep supplies at the pharmacy within the next 1-3 days. If you use inhalers (even only as needed), please bring them with you on the day of your procedure. Your physician has requested that you go to www.startemmi.com and enter the access code given to you at your visit today. This web site gives a general overview about your procedure. However, you should still follow specific instructions given to you by our office regarding your preparation for the procedure.  Thank you for entrusting me with your care and for choosing [facility], Dr. [place] Cellar

## 2018-09-08 ENCOUNTER — Ambulatory Visit (AMBULATORY_SURGERY_CENTER): Payer: Medicare Other | Admitting: Gastroenterology

## 2018-09-08 ENCOUNTER — Encounter: Payer: Self-pay | Admitting: Gastroenterology

## 2018-09-08 VITALS — BP 132/75 | HR 73 | Temp 98.0°F | Resp 14 | Ht 69.25 in | Wt 128.5 lb

## 2018-09-08 DIAGNOSIS — D128 Benign neoplasm of rectum: Secondary | ICD-10-CM

## 2018-09-08 DIAGNOSIS — D123 Benign neoplasm of transverse colon: Secondary | ICD-10-CM

## 2018-09-08 DIAGNOSIS — D120 Benign neoplasm of cecum: Secondary | ICD-10-CM

## 2018-09-08 DIAGNOSIS — K219 Gastro-esophageal reflux disease without esophagitis: Secondary | ICD-10-CM | POA: Diagnosis not present

## 2018-09-08 DIAGNOSIS — E119 Type 2 diabetes mellitus without complications: Secondary | ICD-10-CM | POA: Diagnosis not present

## 2018-09-08 DIAGNOSIS — R197 Diarrhea, unspecified: Secondary | ICD-10-CM | POA: Diagnosis not present

## 2018-09-08 DIAGNOSIS — K5730 Diverticulosis of large intestine without perforation or abscess without bleeding: Secondary | ICD-10-CM

## 2018-09-08 DIAGNOSIS — D129 Benign neoplasm of anus and anal canal: Secondary | ICD-10-CM

## 2018-09-08 DIAGNOSIS — K529 Noninfective gastroenteritis and colitis, unspecified: Secondary | ICD-10-CM

## 2018-09-08 DIAGNOSIS — K635 Polyp of colon: Secondary | ICD-10-CM | POA: Diagnosis not present

## 2018-09-08 MED ORDER — SODIUM CHLORIDE 0.9 % IV SOLN: 500.0000 mL | Freq: Once | INTRAVENOUS | Status: DC

## 2018-09-08 NOTE — Progress Notes (Signed)
Called to room to assist during endoscopic procedure.  Patient ID and intended procedure confirmed with present staff. Received instructions for my participation in the procedure from the performing physician.  

## 2018-09-08 NOTE — Op Note (Signed)
[facility] Patient Name: Jared Murray Procedure Date: 09/08/2018 11:39 AM MRN: [id number] Endoscopist: Remo Lipps P. Havery Moros , MD Age: 43 Referring MD:  Date of Birth: Nov 04, 1975 Gender: Male Account #: [id number] Procedure:                Colonoscopy Indications:              Chronic diarrhea Medicines:                Monitored Anesthesia Care Procedure:                Pre-Anesthesia Assessment:                           - Prior to the procedure, a History and Physical                            was performed, and patient medications and                            allergies were reviewed. The patient's tolerance of                            previous anesthesia was also reviewed. The risks                            and benefits of the procedure and the sedation                            options and risks were discussed with the patient.                            All questions were answered, and informed consent                            was obtained. Prior Anticoagulants: The patient has                            taken no previous anticoagulant or antiplatelet                            agents. ASA Grade Assessment: II - A patient with                            mild systemic disease. After reviewing the risks                            and benefits, the patient was deemed in                            satisfactory condition to undergo the procedure.                           After obtaining informed consent, the colonoscope  was passed under direct vision. Throughout the                            procedure, the patient's blood pressure, pulse, and                            oxygen saturations were monitored continuously. The                            Colonoscope was introduced through the anus and                            advanced to the the terminal ileum, with                            identification of the appendiceal orifice and  IC                            valve. The colonoscopy was performed without                            difficulty. The patient tolerated the procedure                            well. The quality of the bowel preparation was                            fair. The terminal ileum, ileocecal valve,                            appendiceal orifice, and rectum were photographed. Scope In: 11:41:57 AM Scope Out: 12:05:07 PM Scope Withdrawal Time: 0 hours 19 minutes 5 seconds  Total Procedure Duration: 0 hours 23 minutes 10 seconds  Findings:                 The perianal and digital rectal examinations were                            normal.                           The terminal ileum appeared normal.                           The mucosa at the ileocecal valve was slightly                            altered, I suspect normal ileal tissue at the valve                            that was prolapsed however biopsies were taken with                            a cold forceps for histology to ensure no  adenomatous change.                           A 3 mm polyp was found in the transverse colon. The                            polyp was sessile. The polyp was removed with a                            cold snare. Resection and retrieval were complete.                           A 4 mm polyp was found in the rectum. The polyp was                            sessile. The polyp was removed with a cold snare.                            Resection and retrieval were complete.                           Multiple small-mouthed diverticula were found in                            the transverse colon and left colon.                           The prep was fair, time spent lavaging the colon to                            achieve mostly adequate views, some areas it is                            possible small or flat polyps could have been                            missed. The left colon was  quite spastic and did                            not retain air well. The exam was otherwise without                            abnormality.                           Biopsies for histology were taken with a cold                            forceps from the right colon, left colon and                            transverse colon for evaluation of microscopic  colitis. Complications:            No immediate complications. Estimated blood loss:                            Minimal. Estimated Blood Loss:     Estimated blood loss was minimal. Impression:               - Preparation of the colon was fair.                           - The examined portion of the ileum was normal.                           - Mucosa at the ileocecal valve as above. Biopsied.                           - One 3 mm polyp in the transverse colon, removed                            with a cold snare. Resected and retrieved.                           - One 4 mm polyp in the rectum, removed with a cold                            snare. Resected and retrieved.                           - Diverticulosis in the transverse colon and in the                            left colon.                           - The examination was otherwise normal.                           - Biopsies were taken with a cold forceps from the                            right colon, left colon and transverse colon for                            evaluation of microscopic colitis. Recommendation:           - Patient has a contact number available for                            emergencies. The signs and symptoms of potential                            delayed complications were discussed with the  patient. Return to normal activities tomorrow.                            Written discharge instructions were provided to the                            patient.                           - Resume previous  diet.                           - Continue present medications.                           - Await pathology results. Remo Lipps P. Ido Wollman, MD 09/08/2018 12:18:26 PM This report has been signed electronically.

## 2018-09-08 NOTE — Patient Instructions (Signed)
YOU HAD AN ENDOSCOPIC PROCEDURE TODAY AT THE [facility]:   Refer to the procedure report that was given to you for any specific questions about what was found during the examination.  If the procedure report does not answer your questions, please call your gastroenterologist to clarify.  If you requested that your care partner not be given the details of your procedure findings, then the procedure report has been included in a sealed envelope for you to review at your convenience later.  YOU SHOULD EXPECT: Some feelings of bloating in the abdomen. Passage of more gas than usual.  Walking can help get rid of the air that was put into your GI tract during the procedure and reduce the bloating. If you had a lower endoscopy (such as a colonoscopy or flexible sigmoidoscopy) you may notice spotting of blood in your stool or on the toilet paper. If you underwent a bowel prep for your procedure, you may not have a normal bowel movement for a few days.  Please Note:  You might notice some irritation and congestion in your nose or some drainage.  This is from the oxygen used during your procedure.  There is no need for concern and it should clear up in a day or so.  SYMPTOMS TO REPORT IMMEDIATELY:   Following lower endoscopy (colonoscopy or flexible sigmoidoscopy):  Excessive amounts of blood in the stool  Significant tenderness or worsening of abdominal pains  Swelling of the abdomen that is new, acute  Fever of 100F or higher  For urgent or emergent issues, a gastroenterologist can be reached at any hour by calling [phone].   DIET:  We do recommend a small meal at first, but then you may proceed to your regular diet.  Drink plenty of fluids but you should avoid alcoholic beverages for 24 hours.  ACTIVITY:  You should plan to take it easy for the rest of today and you should NOT DRIVE or use heavy machinery until tomorrow (because of the sedation medicines used during the test).     FOLLOW UP: Our staff will call the number listed on your records the next business day following your procedure to check on you and address any questions or concerns that you may have regarding the information given to you following your procedure. If we do not reach you, we will leave a message.  However, if you are feeling well and you are not experiencing any problems, there is no need to return our call.  We will assume that you have returned to your regular daily activities without incident.  If any biopsies were taken you will be contacted by phone or by letter within the next 1-3 weeks.  Please call us at [phone] if you have not heard about the biopsies in 3 weeks.    SIGNATURES/CONFIDENTIALITY: You and/or your care partner have signed paperwork which will be entered into your electronic medical record.  These signatures attest to the fact that that the information above on your After Visit Summary has been reviewed and is understood.  Full responsibility of the confidentiality of this discharge information lies with you and/or your care-partner. 

## 2018-09-09 ENCOUNTER — Telehealth: Payer: Self-pay

## 2018-09-09 NOTE — Telephone Encounter (Signed)
  Follow up Call-  Call back number 09/08/2018  Post procedure Call Back phone  # [phone]  Permission to leave phone message Yes  Some recent data might be hidden     Patient questions:  Do you have a fever, pain , or abdominal swelling? No. Pain Score  0 *  Have you tolerated food without any problems? Yes.    Have you been able to return to your normal activities? Yes.    Do you have any questions about your discharge instructions: Diet   No. Medications  No. Follow up visit  No.  Do you have questions or concerns about your Care? No.  Actions: * If pain score is 4 or above: No action needed, pain <4.

## 2018-10-07 ENCOUNTER — Ambulatory Visit: Payer: Medicare Other | Admitting: Gastroenterology

## 2018-10-07 ENCOUNTER — Telehealth: Payer: Self-pay

## 2018-10-07 NOTE — Telephone Encounter (Signed)
Left message. Pt needs to be rescheduled for his missed appointment today.

## 2018-10-19 ENCOUNTER — Ambulatory Visit: Payer: Medicare Other | Attending: Family Medicine | Admitting: Family Medicine

## 2018-10-19 ENCOUNTER — Encounter: Payer: Self-pay | Admitting: Family Medicine

## 2018-10-19 VITALS — BP 119/78 | HR 76 | Temp 97.6°F | Ht 69.25 in | Wt 131.0 lb

## 2018-10-19 DIAGNOSIS — Z79899 Other long term (current) drug therapy: Secondary | ICD-10-CM | POA: Diagnosis not present

## 2018-10-19 DIAGNOSIS — Z833 Family history of diabetes mellitus: Secondary | ICD-10-CM | POA: Diagnosis not present

## 2018-10-19 DIAGNOSIS — M1990 Unspecified osteoarthritis, unspecified site: Secondary | ICD-10-CM | POA: Insufficient documentation

## 2018-10-19 DIAGNOSIS — Z794 Long term (current) use of insulin: Secondary | ICD-10-CM | POA: Insufficient documentation

## 2018-10-19 DIAGNOSIS — Z880 Allergy status to penicillin: Secondary | ICD-10-CM | POA: Diagnosis not present

## 2018-10-19 DIAGNOSIS — Z91040 Latex allergy status: Secondary | ICD-10-CM | POA: Diagnosis not present

## 2018-10-19 DIAGNOSIS — F329 Major depressive disorder, single episode, unspecified: Secondary | ICD-10-CM | POA: Diagnosis not present

## 2018-10-19 DIAGNOSIS — E1010 Type 1 diabetes mellitus with ketoacidosis without coma: Secondary | ICD-10-CM | POA: Diagnosis not present

## 2018-10-19 DIAGNOSIS — Z8249 Family history of ischemic heart disease and other diseases of the circulatory system: Secondary | ICD-10-CM | POA: Diagnosis not present

## 2018-10-19 DIAGNOSIS — M79606 Pain in leg, unspecified: Secondary | ICD-10-CM | POA: Diagnosis not present

## 2018-10-19 DIAGNOSIS — E1049 Type 1 diabetes mellitus with other diabetic neurological complication: Secondary | ICD-10-CM | POA: Diagnosis not present

## 2018-10-19 DIAGNOSIS — Z91030 Bee allergy status: Secondary | ICD-10-CM | POA: Insufficient documentation

## 2018-10-19 DIAGNOSIS — M8700 Idiopathic aseptic necrosis of unspecified bone: Secondary | ICD-10-CM | POA: Diagnosis not present

## 2018-10-19 DIAGNOSIS — K219 Gastro-esophageal reflux disease without esophagitis: Secondary | ICD-10-CM | POA: Diagnosis not present

## 2018-10-19 DIAGNOSIS — F419 Anxiety disorder, unspecified: Secondary | ICD-10-CM | POA: Diagnosis not present

## 2018-10-19 DIAGNOSIS — E1065 Type 1 diabetes mellitus with hyperglycemia: Secondary | ICD-10-CM | POA: Diagnosis not present

## 2018-10-19 DIAGNOSIS — E1029 Type 1 diabetes mellitus with other diabetic kidney complication: Secondary | ICD-10-CM | POA: Diagnosis not present

## 2018-10-19 LAB — POCT GLYCOSYLATED HEMOGLOBIN (HGB A1C): HbA1c, POC (controlled diabetic range): 11.3 % — AB (ref 0.0–7.0)

## 2018-10-19 LAB — GLUCOSE, POCT (MANUAL RESULT ENTRY): POC GLUCOSE: 151 mg/dL — AB (ref 70–99)

## 2018-10-19 MED ORDER — PANTOPRAZOLE SODIUM 40 MG PO TBEC: 40.0000 mg | DELAYED_RELEASE_TABLET | Freq: Every day | ORAL | 5 refills | Status: DC

## 2018-10-19 MED ORDER — ATORVASTATIN CALCIUM 20 MG PO TABS: 20.0000 mg | ORAL_TABLET | Freq: Every day | ORAL | 3 refills | Status: DC

## 2018-10-19 MED ORDER — INSULIN GLARGINE 100 UNIT/ML ~~LOC~~ SOLN: 15.0000 [IU] | Freq: Every day | SUBCUTANEOUS | 1 refills | Status: DC

## 2018-10-19 MED ORDER — ACCU-CHEK SOFTCLIX LANCETS MISC: 5 refills | Status: DC

## 2018-10-19 MED ORDER — ACCU-CHEK AVIVA PLUS W/DEVICE KIT: PACK | 0 refills | Status: DC

## 2018-10-19 MED ORDER — GLUCOSE BLOOD VI STRP: ORAL_STRIP | 11 refills | Status: DC

## 2018-10-19 MED ORDER — GABAPENTIN 300 MG PO CAPS: 300.0000 mg | ORAL_CAPSULE | Freq: Two times a day (BID) | ORAL | 3 refills | Status: DC

## 2018-10-19 MED ORDER — ACETAMINOPHEN-CODEINE #3 300-30 MG PO TABS: 1.0000 | ORAL_TABLET | Freq: Two times a day (BID) | ORAL | 0 refills | Status: DC | PRN

## 2018-10-19 MED FILL — PANTOPRAZOLE SOD DR 40 MG T: 40 | 30 days supply | Qty: 30 | Fill #0

## 2018-10-19 MED FILL — LANTUS 100 UNITS/ML VIAL: 100 | 28 days supply | Qty: 10 | Fill #0

## 2018-10-19 MED FILL — ATORVASTATIN 20 MG TABLET: 20 | 30 days supply | Qty: 30 | Fill #0

## 2018-10-19 MED FILL — GABAPENTIN 300 MG CAPSULE: 300 | 30 days supply | Qty: 60 | Fill #0

## 2018-10-19 NOTE — Progress Notes (Signed)
Subjective:  Patient ID: Jared Murray, male    DOB: [birth year]  Age: 43 y.o. MRN: [id number]  CC: Diabetes and Leg Pain   HPI Jared Murray is a 43 year old male with a history of uncontrolled type 1 diabetes mellitus (A1c 11.3), noncompliance, multiple hospital admissions for DKA, avascular necrosis of both hips, Psoriasis who comes into the clinic for a follow-up visit. He was previously followed by [facility] endocrinology however he has not been back to see them in a while but endorses compliance with his prescribed regimen of insulin but adherence with a diabetic diet cannot be ascertained. He denies visual concerns but endorses numbness in his feet.  His blood sugars seem to swing from highs to lows with a fasting blood sugar of 151 in the clinic  He previously had chronic diarrhea which he states has improved at this time along with his nausea and vomiting which he thinks were related to his anxiety.  His anxiety stems from relationship problems with his spouse which he states he is working on and he has also been depressed lately due to multiple family deaths and his chronic pain.  He denies suicidal ideations or intents. He complains of pain in his hips which is worse in the right hip.  He has avascular necrosis of the hips and is unable to undergo surgery due to failure to achieve optimal glycemic control.  He now has to ambulate with a cane.  Past Medical History:  Diagnosis Date  . Arthritis   . DKA (diabetic ketoacidoses) ([place])   . Eczema   . GERD (gastroesophageal reflux disease)   . Psoriasis   . Smoker   . Substance abuse ([place])   . Type 1 diabetes mellitus ([place])     Past Surgical History:  Procedure Laterality Date  . FINGER SURGERY Right    middle finger    Family History  Problem Relation Age of Onset  . Hypertension Father   . Diabetes Other        multiple  . Lupus Cousin   . Stroke Maternal Grandmother   . Stroke Paternal Grandmother   . Colon cancer  Neg Hx   . Stomach cancer Neg Hx   . Rectal cancer Neg Hx   . Esophageal cancer Neg Hx     Allergies  Allergen Reactions  . Bee Venom   . Latex Itching and Rash  . Penicillins Other (See Comments)    Childhood allergy Has patient had a PCN reaction causing immediate rash, facial/tongue/throat swelling, SOB or lightheadedness with hypotension: NO Has patient had a PCN reaction causing severe rash involving mucus membranes or skin necrosis:NO Has patient had a PCN reaction that required hospitalization NO Has patient had a PCN reaction occurring within the last 10 years: NO If all of the above answers are "NO", then may proceed with Cephalosporin use.    Outpatient Medications Prior to Visit  Medication Sig Dispense Refill  . aspirin-sod bicarb-citric acid (ALKA-SELTZER) 325 MG TBEF tablet Take 325 mg by mouth every 6 (six) hours as needed (heartburn).    . clobetasol cream (TEMOVATE) 0.05 % APPLY EXTERNALLY TO THE AFFECTED AREA TWICE DAILY 30 g 0  . Insulin Syringe-Needle U-100 (B-D INS SYRINGE 2CC/29GX1/2") 29G X 1/2" 2 ML MISC Check Blood sugar TID and QHS 100 each 12  . NOVOLOG 100 UNIT/ML injection INJECT 0 TO 12 UNITS INTO THE SKIN THREE TIMES DAILY WITH MEALS AS PER SLIDING SCALE 10 mL 0  .  ACCU-CHEK SOFTCLIX LANCETS lancets Use as instructed 3 times daily. Dx: Type 1 DM 100 each 5  . acetaminophen-codeine (TYLENOL #3) 300-30 MG tablet Take 1 tablet by mouth every 12 (twelve) hours as needed for moderate pain. Dx:P avascular necrosis of the hip 60 tablet 1  . atorvastatin (LIPITOR) 20 MG tablet Take 1 tablet (20 mg total) by mouth daily. 30 tablet 3  . glucose blood (ACCU-CHEK AVIVA PLUS) test strip USE TO CHECK BLOOD SUGAR FOUR TIMES DAILY BEFORE MEALS AND AT BEDTIME 100 each 11  . insulin glargine (LANTUS) 100 UNIT/ML injection Inject 0.15 mLs (15 Units total) into the skin at bedtime. 30 mL 3  . pantoprazole (PROTONIX) 40 MG tablet Take 1 tablet (40 mg total) by mouth daily. 30  tablet 5   No facility-administered medications prior to visit.      ROS Review of Systems  Constitutional: Negative for activity change and appetite change.  HENT: Negative for sinus pressure and sore throat.   Eyes: Negative for visual disturbance.  Respiratory: Negative for cough, chest tightness and shortness of breath.   Cardiovascular: Negative for chest pain and leg swelling.  Gastrointestinal: Negative for abdominal distention, abdominal pain, constipation and diarrhea.  Endocrine: Negative.   Genitourinary: Negative for dysuria.  Musculoskeletal:       See hpi  Skin: Positive for color change. Negative for rash.  Allergic/Immunologic: Negative.   Neurological: Negative for weakness, light-headedness and numbness.  Psychiatric/Behavioral: Negative for dysphoric mood and suicidal ideas.    Objective:  BP 119/78   Pulse 76   Temp 97.6 F (36.4 C) (Oral)   Ht 5' 9.25" (1.759 m)   Wt 131 lb (59.4 kg)   SpO2 100%   BMI 19.21 kg/m   BP/Weight 10/19/2018 09/08/2018 8/00/3491  Systolic BP 791 505 90  Diastolic BP 78 75 70  Wt. (Lbs) 131 128.5 128.5  BMI 19.21 18.84 18.84      Physical Exam Constitutional:      Appearance: He is well-developed.  Cardiovascular:     Rate and Rhythm: Normal rate.     Heart sounds: Normal heart sounds. No murmur.  Pulmonary:     Effort: Pulmonary effort is normal.     Breath sounds: Normal breath sounds. No wheezing or rales.  Chest:     Chest wall: No tenderness.  Abdominal:     General: Bowel sounds are normal. There is no distension.     Palpations: Abdomen is soft. There is no mass.     Tenderness: There is no abdominal tenderness.  Musculoskeletal:     Comments: Tenderness on range of motion of bilateral hips Ambulates with the aid of a cane  Neurological:     Mental Status: He is alert and oriented to person, place, and time.  Psychiatric:        Mood and Affect: Mood normal.    Depression screen [facility] 2/9 10/19/2018  07/19/2018 09/03/2017 04/01/2017 12/24/2016  Decreased Interest _0 Down, Depressed, Hopeless _1 PHQ - 2 Score _2 Altered sleeping _3 Tired, decreased energy _4 Change in appetite _5 Feeling bad or failure about yourself  _6 Trouble concentrating _7 Moving slowly or fidgety/restless 2 0 1 0 2  Suicidal thoughts 2 0 0 0 1  PHQ-9 Score  _0 Some recent data might be hidden    GAD 7 : Generalized Anxiety Score 10/19/2018 07/19/2018 09/03/2017 04/01/2017  Nervous, Anxious, on Edge _1 Control/stop worrying _2 Worry too much - different things _3 Trouble relaxing _4 Restless _5 Easily annoyed or irritable _6 Afraid - awful might happen _7 Total GAD 7 Score _8 CMP Latest Ref Rng & Units 12/02/2017 08/02/2017 08/02/2017  Glucose 65 - 99 mg/dL 104(H) 188(H) 99  BUN 6 - 24 mg/dL _9 Creatinine 0.76 - 1.27 mg/dL 0.89 0.65 0.75  Sodium 134 - 144 mmol/L 137 132(L) 134(L)  Potassium 3.5 - 5.2 mmol/L 4.7 3.2(L) 2.7(LL)  Chloride 96 - 106 mmol/L 96 95(L) 93(L)  CO2 20 - 29 mmol/L _10 Calcium 8.7 - 10.2 mg/dL 10.0 8.0(L) 8.1(L)  Total Protein 6.0 - 8.5 g/dL 7.0 - -  Total Bilirubin 0.0 - 1.2 mg/dL 1.1 - -  Alkaline Phos 39 - 117 IU/L 127(H) - -  AST 0 - 40 IU/L 20 - -  ALT 0 - 44 IU/L 23 - -    Lipid Panel     Component Value Date/Time   CHOL 134 01/01/2017 0847   TRIG 82 01/01/2017 0847   HDL 44 01/01/2017 0847   CHOLHDL 3.0 01/01/2017 0847   CHOLHDL 2.3 12/19/2015 0947   VLDL 12 12/19/2015 0947   LDLCALC 74 01/01/2017 0847    CBC    Component Value Date/Time   WBC 6.6 08/01/2017 1251   RBC 4.57 08/01/2017 1251   HGB 14.0 08/01/2017 1251   HCT 40.3 08/01/2017 1251   PLT 416 (H) 08/01/2017 1251   MCV 88.2 08/01/2017 1251   MCH 30.6 08/01/2017 1251   MCHC 34.7 08/01/2017 1251   RDW 12.7 08/01/2017 1251   LYMPHSABS 1.0 01/08/2017 1140   MONOABS  0.3 01/08/2017 1140   EOSABS 0.0 01/08/2017 1140   BASOSABS 0.0 01/08/2017 1140    Lab Results  Component Value Date   HGBA1C 11.3 (A) 10/19/2018    Assessment & Plan:   1. 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 Dx: Type 1 DM  Dispense: 100 each; Refill: 5 - CMP14+EGFR - Lipid panel - Microalbumin / creatinine urine ratio - Ambulatory referral to Ophthalmology  2. 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  Dispense: 60 capsule; Refill: 3  3. Type 1 diabetes mellitus with hyperglycemia (HCC) See #1 above - insulin glargine (LANTUS) 100 UNIT/ML injection; Inject 0.15 mLs (15 Units total) into the skin at bedtime.  Dispense: 30  mL; Refill: 1 - atorvastatin (LIPITOR) 20 MG tablet; Take 1 tablet (20 mg total) by mouth daily.  Dispense: 30 tablet; Refill: 3  4. Avascular necrosis ([place]) Surgery on hold due to poor glycemic control He uses Tylenol 3 intermittently I will refer to the pain clinic as he will need this chronically given his surgery does not seem to be anytime soon - Ambulatory referral to Pain Clinic - acetaminophen-codeine (TYLENOL #3) 300-30 MG tablet; Take 1 tablet by mouth every 12 (twelve) hours as needed  for moderate pain. Dx:P avascular necrosis of the hip  Dispense: 60 tablet; Refill: 0  5. Anxiety and depression PHQ 9 of 18; gad 7 score is 11 Secondary to several family deaths, chronic pain, problems with his spouse Declines initiation of medication or referral for therapy States he is coping well   Meds ordered this encounter  Medications  . ACCU-CHEK SOFTCLIX LANCETS lancets    Sig: Use as instructed 3 times daily. Dx: Type 1 DM    Dispense:  100 each    Refill:  5  . glucose blood (ACCU-CHEK AVIVA PLUS) test strip    Sig: USE TO CHECK BLOOD SUGAR FOUR TIMES DAILY BEFORE MEALS AND AT BEDTIME    Dispense:  100 each    Refill:  11  . Blood Glucose Monitoring Suppl (ACCU-CHEK AVIVA PLUS) w/Device KIT    Sig: TEST 3 TIMES DAILY    Dispense:  1 kit    Refill:  0  . insulin glargine (LANTUS) 100 UNIT/ML injection    Sig: Inject 0.15 mLs (15 Units total) into the skin at bedtime.    Dispense:  30 mL    Refill:  1  . atorvastatin (LIPITOR) 20 MG tablet    Sig: Take 1 tablet (20 mg total) by mouth daily.    Dispense:  30 tablet    Refill:  3  . pantoprazole (PROTONIX) 40 MG tablet    Sig: Take 1 tablet (40 mg total) by mouth daily.    Dispense:  30 tablet    Refill:  5  . acetaminophen-codeine (TYLENOL #3) 300-30 MG tablet    Sig: Take 1 tablet by mouth every 12 (twelve) hours as needed for moderate pain. Dx:P avascular necrosis of the hip    Dispense:  60 tablet    Refill:  0  . gabapentin (NEURONTIN) 300 MG capsule    Sig: Take 1 capsule (300 mg total) by mouth 2 (two) times daily.    Dispense:  60 capsule    Refill:  3    Follow-up: Return in about 3 months (around 01/19/2019) for follow up of chronic medical conditions.       Charlott Rakes, MD, FAAFP. [facility] [place], [place]   10/19/2018, 9:41 AM

## 2018-10-19 NOTE — Patient Instructions (Signed)
Diabetes Mellitus and Nutrition, Adult  When you have diabetes (diabetes mellitus), it is very important to have healthy eating habits because your blood sugar (glucose) levels are greatly affected by what you eat and drink. Eating healthy foods in the appropriate amounts, at about the same times every day, can help you:  · Control your blood glucose.  · Lower your risk of heart disease.  · Improve your blood pressure.  · Reach or maintain a healthy weight.  Every person with diabetes is different, and each person has different needs for a meal plan. Your health care provider may recommend that you work with a diet and nutrition specialist (dietitian) to make a meal plan that is best for you. Your meal plan may vary depending on factors such as:  · The calories you need.  · The medicines you take.  · Your weight.  · Your blood glucose, blood pressure, and cholesterol levels.  · Your activity level.  · Other health conditions you have, such as heart or kidney disease.  How do carbohydrates affect me?  Carbohydrates, also called carbs, affect your blood glucose level more than any other type of food. Eating carbs naturally raises the amount of glucose in your blood. Carb counting is a method for keeping track of how many carbs you eat. Counting carbs is important to keep your blood glucose at a healthy level, especially if you use insulin or take certain oral diabetes medicines.  It is important to know how many carbs you can safely have in each meal. This is different for every person. Your dietitian can help you calculate how many carbs you should have at each meal and for each snack.  Foods that contain carbs include:  · Bread, cereal, rice, pasta, and crackers.  · Potatoes and corn.  · Peas, beans, and lentils.  · Milk and yogurt.  · Fruit and juice.  · Desserts, such as cakes, cookies, ice cream, and candy.  How does alcohol affect me?  Alcohol can cause a sudden decrease in blood glucose (hypoglycemia),  especially if you use insulin or take certain oral diabetes medicines. Hypoglycemia can be a life-threatening condition. Symptoms of hypoglycemia (sleepiness, dizziness, and confusion) are similar to symptoms of having too much alcohol.  If your health care provider says that alcohol is safe for you, follow these guidelines:  · Limit alcohol intake to no more than 1 drink per day for nonpregnant women and 2 drinks per day for men. One drink equals 12 oz of beer, 5 oz of wine, or 1½ oz of hard liquor.  · Do not drink on an empty stomach.  · Keep yourself hydrated with water, diet soda, or unsweetened iced tea.  · Keep in mind that regular soda, juice, and other mixers may contain a lot of sugar and must be counted as carbs.  What are tips for following this plan?    Reading food labels  · Start by checking the serving size on the "Nutrition Facts" label of packaged foods and drinks. The amount of calories, carbs, fats, and other nutrients listed on the label is based on one serving of the item. Many items contain more than one serving per package.  · Check the total grams (g) of carbs in one serving. You can calculate the number of servings of carbs in one serving by dividing the total carbs by 15. For example, if a food has 30 g of total carbs, it would be equal to 2   servings of carbs.  · Check the number of grams (g) of saturated and trans fats in one serving. Choose foods that have low or no amount of these fats.  · Check the number of milligrams (mg) of salt (sodium) in one serving. Most people should limit total sodium intake to less than 2,300 mg per day.  · Always check the nutrition information of foods labeled as "low-fat" or "nonfat". These foods may be higher in added sugar or refined carbs and should be avoided.  · Talk to your dietitian to identify your daily goals for nutrients listed on the label.  Shopping  · Avoid buying canned, premade, or processed foods. These foods tend to be high in fat, sodium,  and added sugar.  · Shop around the outside edge of the grocery store. This includes fresh fruits and vegetables, bulk grains, fresh meats, and fresh dairy.  Cooking  · Use low-heat cooking methods, such as baking, instead of high-heat cooking methods like deep frying.  · Cook using healthy oils, such as olive, canola, or sunflower oil.  · Avoid cooking with butter, cream, or high-fat meats.  Meal planning  · Eat meals and snacks regularly, preferably at the same times every day. Avoid going long periods of time without eating.  · Eat foods high in fiber, such as fresh fruits, vegetables, beans, and whole grains. Talk to your dietitian about how many servings of carbs you can eat at each meal.  · Eat 4-6 ounces (oz) of lean protein each day, such as lean meat, chicken, fish, eggs, or tofu. One oz of lean protein is equal to:  ? 1 oz of meat, chicken, or fish.  ? 1 egg.  ? ¼ cup of tofu.  · Eat some foods each day that contain healthy fats, such as avocado, nuts, seeds, and fish.  Lifestyle  · Check your blood glucose regularly.  · Exercise regularly as told by your health care provider. This may include:  ? 150 minutes of moderate-intensity or vigorous-intensity exercise each week. This could be brisk walking, biking, or water aerobics.  ? Stretching and doing strength exercises, such as yoga or weightlifting, at least 2 times a week.  · Take medicines as told by your health care provider.  · Do not use any products that contain nicotine or tobacco, such as cigarettes and e-cigarettes. If you need help quitting, ask your health care provider.  · Work with a counselor or diabetes educator to identify strategies to manage stress and any emotional and social challenges.  Questions to ask a health care provider  · Do I need to meet with a diabetes educator?  · Do I need to meet with a dietitian?  · What number can I call if I have questions?  · When are the best times to check my blood glucose?  Where to find more  information:  · American Diabetes Association: diabetes.org  · Academy of Nutrition and Dietetics: www.eatright.org  · National Institute of Diabetes and Digestive and Kidney Diseases (NIH): www.niddk.nih.gov  Summary  · A healthy meal plan will help you control your blood glucose and maintain a healthy lifestyle.  · Working with a diet and nutrition specialist (dietitian) can help you make a meal plan that is best for you.  · Keep in mind that carbohydrates (carbs) and alcohol have immediate effects on your blood glucose levels. It is important to count carbs and to use alcohol carefully.  This information is not intended to   replace advice given to you by your health care provider. Make sure you discuss any questions you have with your health care provider.  Document Released: 05/01/2005 Document Revised: 03/04/2017 Document Reviewed: 09/08/2016  Elsevier Interactive Patient Education © 2019 [employer]vier Inc.

## 2018-10-20 LAB — CMP14+EGFR
ALBUMIN: 4.7 g/dL (ref 4.0–5.0)
ALT: 17 IU/L (ref 0–44)
AST: 23 IU/L (ref 0–40)
Albumin/Globulin Ratio: 2 (ref 1.2–2.2)
Alkaline Phosphatase: 165 IU/L — ABNORMAL HIGH (ref 39–117)
BILIRUBIN TOTAL: 1.9 mg/dL — AB (ref 0.0–1.2)
BUN / CREAT RATIO: 9 (ref 9–20)
BUN: 7 mg/dL (ref 6–24)
CO2: 22 mmol/L (ref 20–29)
Calcium: 9.6 mg/dL (ref 8.7–10.2)
Chloride: 99 mmol/L (ref 96–106)
Creatinine, Ser: 0.8 mg/dL (ref 0.76–1.27)
GFR calc Af Amer: 127 mL/min/{1.73_m2} (ref >59)
GFR calc non Af Amer: 110 mL/min/{1.73_m2} (ref >59)
Globulin, Total: 2.3 g/dL (ref 1.5–4.5)
Glucose: 147 mg/dL — ABNORMAL HIGH (ref 65–99)
Potassium: 4.5 mmol/L (ref 3.5–5.2)
SODIUM: 139 mmol/L (ref 134–144)
Total Protein: 7 g/dL (ref 6.0–8.5)

## 2018-10-20 LAB — LIPID PANEL
Chol/HDL Ratio: 1.6 ratio (ref 0.0–5.0)
Cholesterol, Total: 109 mg/dL (ref 100–199)
HDL: 69 mg/dL (ref >39)
LDL Calculated: 32 mg/dL (ref 0–99)
Triglycerides: 39 mg/dL (ref 0–149)
VLDL Cholesterol Cal: 8 mg/dL (ref 5–40)

## 2018-10-20 NOTE — Telephone Encounter (Signed)
Called several times asking for return call to get pt rescheduled for missed appt with Dr.Mansouraty on 10/07/2018. I have mailed letter asking pt to contact office.

## 2018-11-15 DIAGNOSIS — M129 Arthropathy, unspecified: Secondary | ICD-10-CM | POA: Diagnosis not present

## 2018-11-15 DIAGNOSIS — M25552 Pain in left hip: Secondary | ICD-10-CM | POA: Diagnosis not present

## 2018-11-15 DIAGNOSIS — Z79899 Other long term (current) drug therapy: Secondary | ICD-10-CM | POA: Diagnosis not present

## 2018-11-15 DIAGNOSIS — M25551 Pain in right hip: Secondary | ICD-10-CM | POA: Diagnosis not present

## 2018-11-18 ENCOUNTER — Other Ambulatory Visit: Payer: Self-pay | Admitting: Family Medicine

## 2018-11-18 DIAGNOSIS — E1065 Type 1 diabetes mellitus with hyperglycemia: Secondary | ICD-10-CM

## 2018-11-18 MED FILL — ATORVASTATIN 20 MG TABLET: 20 | 30 days supply | Qty: 30 | Fill #1

## 2018-11-18 MED FILL — LANTUS 100 UNITS/ML VIAL: 100 | 28 days supply | Qty: 10 | Fill #1

## 2018-11-18 MED FILL — PANTOPRAZOLE SOD DR 40 MG T: 40 | 30 days supply | Qty: 30 | Fill #1

## 2018-11-18 MED FILL — GABAPENTIN 300 MG CAPSULE: 300 | 30 days supply | Qty: 60 | Fill #1

## 2018-11-19 MED FILL — NovoLOG 100 UNIT/ML SOLN: 100 | 27 days supply | Qty: 10 | Fill #0

## 2018-11-23 ENCOUNTER — Encounter (HOSPITAL_COMMUNITY): Payer: Self-pay | Admitting: Emergency Medicine

## 2018-11-23 ENCOUNTER — Emergency Department (HOSPITAL_COMMUNITY): Payer: Medicare Other

## 2018-11-23 ENCOUNTER — Other Ambulatory Visit: Payer: Self-pay

## 2018-11-23 ENCOUNTER — Emergency Department (HOSPITAL_COMMUNITY)
Admission: EM | Admit: 2018-11-23 | Discharge: 2018-11-23 | Disposition: A | Discharge status: Home / Self Care | Payer: Medicare Other | Attending: Emergency Medicine | Admitting: Emergency Medicine

## 2018-11-23 ENCOUNTER — Telehealth: Payer: Self-pay | Admitting: Family Medicine

## 2018-11-23 DIAGNOSIS — Z91040 Latex allergy status: Secondary | ICD-10-CM | POA: Insufficient documentation

## 2018-11-23 DIAGNOSIS — N179 Acute kidney failure, unspecified: Secondary | ICD-10-CM | POA: Diagnosis not present

## 2018-11-23 DIAGNOSIS — Z7982 Long term (current) use of aspirin: Secondary | ICD-10-CM | POA: Diagnosis not present

## 2018-11-23 DIAGNOSIS — R1111 Vomiting without nausea: Secondary | ICD-10-CM | POA: Diagnosis not present

## 2018-11-23 DIAGNOSIS — Z79899 Other long term (current) drug therapy: Secondary | ICD-10-CM | POA: Insufficient documentation

## 2018-11-23 DIAGNOSIS — R112 Nausea with vomiting, unspecified: Secondary | ICD-10-CM | POA: Insufficient documentation

## 2018-11-23 DIAGNOSIS — R1110 Vomiting, unspecified: Secondary | ICD-10-CM | POA: Diagnosis not present

## 2018-11-23 DIAGNOSIS — E109 Type 1 diabetes mellitus without complications: Secondary | ICD-10-CM | POA: Diagnosis not present

## 2018-11-23 DIAGNOSIS — Z794 Long term (current) use of insulin: Secondary | ICD-10-CM | POA: Insufficient documentation

## 2018-11-23 DIAGNOSIS — R197 Diarrhea, unspecified: Secondary | ICD-10-CM

## 2018-11-23 DIAGNOSIS — F17210 Nicotine dependence, cigarettes, uncomplicated: Secondary | ICD-10-CM | POA: Insufficient documentation

## 2018-11-23 DIAGNOSIS — R531 Weakness: Secondary | ICD-10-CM | POA: Diagnosis not present

## 2018-11-23 DIAGNOSIS — E860 Dehydration: Secondary | ICD-10-CM | POA: Diagnosis not present

## 2018-11-23 LAB — POCT I-STAT EG7
Acid-Base Excess: 21 mmol/L — ABNORMAL HIGH (ref 0.0–2.0)
Bicarbonate: 45.9 mmol/L — ABNORMAL HIGH (ref 20.0–28.0)
Calcium, Ion: 0.94 mmol/L — ABNORMAL LOW (ref 1.15–1.40)
HCT: 53 % — ABNORMAL HIGH (ref 39.0–52.0)
Hemoglobin: 18 g/dL — ABNORMAL HIGH (ref 13.0–17.0)
O2 Saturation: 62 %
Potassium: 3 mmol/L — ABNORMAL LOW (ref 3.5–5.1)
Sodium: 143 mmol/L (ref 135–145)
TCO2: 47 mmol/L — ABNORMAL HIGH (ref 22–32)
pCO2, Ven: 47.9 mmHg (ref 44.0–60.0)
pH, Ven: 7.59 — ABNORMAL HIGH (ref 7.250–7.430)
pO2, Ven: 28 mmHg — CL (ref 32.0–45.0)

## 2018-11-23 LAB — CBC WITH DIFFERENTIAL/PLATELET
Abs Immature Granulocytes: 0.03 10*3/uL (ref 0.00–0.07)
Basophils Absolute: 0 10*3/uL (ref 0.0–0.1)
Basophils Relative: 0 %
Eosinophils Absolute: 0 10*3/uL (ref 0.0–0.5)
Eosinophils Relative: 0 %
HCT: 52.3 % — ABNORMAL HIGH (ref 39.0–52.0)
Hemoglobin: 17.4 g/dL — ABNORMAL HIGH (ref 13.0–17.0)
Immature Granulocytes: 0 %
Lymphocytes Relative: 15 %
Lymphs Abs: 1.4 10*3/uL (ref 0.7–4.0)
MCH: 29.3 pg (ref 26.0–34.0)
MCHC: 33.3 g/dL (ref 30.0–36.0)
MCV: 88.2 fL (ref 80.0–100.0)
Monocytes Absolute: 0.7 10*3/uL (ref 0.1–1.0)
Monocytes Relative: 8 %
Neutro Abs: 6.6 10*3/uL (ref 1.7–7.7)
Neutrophils Relative %: 77 %
Platelets: 338 10*3/uL (ref 150–400)
RBC: 5.93 MIL/uL — ABNORMAL HIGH (ref 4.22–5.81)
RDW: 12.2 % (ref 11.5–15.5)
WBC: 8.8 10*3/uL (ref 4.0–10.5)
nRBC: 0 % (ref 0.0–0.2)

## 2018-11-23 LAB — COMPREHENSIVE METABOLIC PANEL
ALT: 33 U/L (ref 0–44)
AST: 59 U/L — ABNORMAL HIGH (ref 15–41)
Albumin: 5.1 g/dL — ABNORMAL HIGH (ref 3.5–5.0)
Alkaline Phosphatase: 176 U/L — ABNORMAL HIGH (ref 38–126)
Anion gap: 21 — ABNORMAL HIGH (ref 5–15)
BUN: 18 mg/dL (ref 6–20)
CO2: 39 mmol/L — ABNORMAL HIGH (ref 22–32)
Calcium: 10 mg/dL (ref 8.9–10.3)
Chloride: 85 mmol/L — ABNORMAL LOW (ref 98–111)
Creatinine, Ser: 1.68 mg/dL — ABNORMAL HIGH (ref 0.61–1.24)
GFR calc Af Amer: 57 mL/min — ABNORMAL LOW (ref >60)
GFR calc non Af Amer: 49 mL/min — ABNORMAL LOW (ref >60)
Glucose, Bld: 67 mg/dL — ABNORMAL LOW (ref 70–99)
Potassium: 3.2 mmol/L — ABNORMAL LOW (ref 3.5–5.1)
Sodium: 145 mmol/L (ref 135–145)
Total Bilirubin: 1.6 mg/dL — ABNORMAL HIGH (ref 0.3–1.2)
Total Protein: 8.9 g/dL — ABNORMAL HIGH (ref 6.5–8.1)

## 2018-11-23 LAB — CBG MONITORING, ED
Glucose-Capillary: 78 mg/dL (ref 70–99)
Glucose-Capillary: 49 mg/dL — ABNORMAL LOW (ref 70–99)
Glucose-Capillary: 211 mg/dL — ABNORMAL HIGH (ref 70–99)

## 2018-11-23 LAB — LIPASE, BLOOD: Lipase: 16 U/L (ref 11–51)

## 2018-11-23 MED ORDER — DEXTROSE 10 % IV SOLN
INTRAVENOUS | Status: AC
  Administered 2018-11-23: 12:00:00 via INTRAVENOUS

## 2018-11-23 MED ORDER — SODIUM CHLORIDE 0.9 % IV SOLN
INTRAVENOUS | Status: DC
  Administered 2018-11-23: 14:00:00 via INTRAVENOUS

## 2018-11-23 MED ORDER — DEXTROSE 50 % IV SOLN
INTRAVENOUS | Status: AC
  Administered 2018-11-23: 12:00:00
  Filled 2018-11-23: qty 50

## 2018-11-23 MED ORDER — SODIUM CHLORIDE 0.9 % IV BOLUS
1000.0000 mL | Freq: Once | INTRAVENOUS | Status: AC
  Administered 2018-11-23: 1000 mL via INTRAVENOUS

## 2018-11-23 MED ORDER — FAMOTIDINE 20 MG PO TABS: 20.0000 mg | ORAL_TABLET | Freq: Two times a day (BID) | ORAL | 0 refills | Status: DC

## 2018-11-23 MED ORDER — ONDANSETRON HCL 4 MG/2ML IJ SOLN
4.0000 mg | Freq: Once | INTRAMUSCULAR | Status: AC
  Administered 2018-11-23: 12:00:00 4 mg via INTRAVENOUS
  Filled 2018-11-23: qty 2

## 2018-11-23 MED ORDER — ONDANSETRON 8 MG PO TBDP: 8.0000 mg | ORAL_TABLET | Freq: Three times a day (TID) | ORAL | 0 refills | Status: DC | PRN

## 2018-11-23 MED ORDER — GLUCOSE 4 G PO CHEW
CHEWABLE_TABLET | ORAL | Status: AC
  Filled 2018-11-23: qty 1

## 2018-11-23 NOTE — ED Notes (Signed)
Pt aware a urine sample is needed and will provide one when able.  

## 2018-11-23 NOTE — ED Notes (Signed)
Patient is aware he needs to give a urine sample. Urinal at bedside.

## 2018-11-23 NOTE — Telephone Encounter (Signed)
New Message   Pt states he is having some vomiting, has been sick since Sunday, he is dehydrated, and he thinks its his medication but not sure. Please f/u

## 2018-11-23 NOTE — ED Notes (Signed)
Patient reminded he needs to provide a urine sample. He currently still does not feel like he has to urinate.

## 2018-11-23 NOTE — Discharge Instructions (Addendum)
Drink plenty of fluids, start with soups and broths, advance your diet as you feel better, follow-up with your primary care doctor to make sure you are improving and consider rechecking your electrolytes.  Return to the ED for worsening symptoms.

## 2018-11-23 NOTE — ED Provider Notes (Signed)
[facility] EMERGENCY DEPARTMENT Provider Note   CSN: [id number] Arrival date & time:       History   Chief Complaint Chief Complaint  Patient presents with  . Emesis  . Nausea    HPI Jared Murray is a 43 y.o. male.     HPI Patient presents to the emergency room for evaluation of nausea vomiting.  Patient states he started having trouble with nausea and vomiting Sunday.  He is had numerous episodes since then.  He is not really able to keep anything down.  He started to feel weak and feels dehydrated.  He denies any diarrhea.  He denies any abdominal pain.  He has not had any trouble with any fevers or coughing.  He does have a history of diabetes but has been taking his insulin and his blood sugars have been running in the 150s to 160s. Past Medical History:  Diagnosis Date  . Arthritis   . DKA (diabetic ketoacidoses) ([place])   . Eczema   . GERD (gastroesophageal reflux disease)   . Psoriasis   . Smoker   . Substance abuse ([place])   . Type 1 diabetes mellitus [facility])     Patient Active Problem List   Diagnosis Date Noted  . Chronic diarrhea of unknown origin 09/10/2017  . Intractable nausea and vomiting 08/02/2017  . Nausea and vomiting 08/01/2017  . Hyperglycemia due to type 1 diabetes mellitus ([place])   . Osteoarthritis 12/24/2016  . Hyperbilirubinemia   . Non compliance w medication regimen 01/07/2016  . Generalized anxiety disorder 11/21/2015  . Non compliance with medical treatment 11/21/2015  . DKA (diabetic ketoacidoses) ([place]) 11/02/2015  . Leukocytosis 11/02/2015  . Tobacco use disorder 09/13/2015  . Nausea & vomiting 08/23/2015  . Type 1 diabetes mellitus with hyperglycemia ([place])   . Diabetic ketoacidosis without coma associated with type 1 diabetes mellitus ([place])   . SIRS (systemic inflammatory response syndrome) ([facility])   . Avascular necrosis of bone of hip ([place])   . DKA (diabetic ketoacidosis) ([place]) 05/14/2015  . Diabetic ketoacidosis  associated with other specified diabetes mellitus   . Lactic acidosis   . Cutaneous abscess of chest wall   . Diabetes type 1, uncontrolled ([place])   . DKA, type 1, not at goal [facility]) 04/03/2015  . Chest wall abscess 04/03/2015  . Avascular necrosis ([place]) 02/12/2015  . Type 1 diabetes mellitus ([place]) 01/29/2015  . Sepsis ([place]) 01/22/2015  . GERD (gastroesophageal reflux disease) 01/22/2015  . AKI (acute kidney injury) ([place]) 01/22/2015  . Hyperkalemia   . Viral gastroenteritis 07/01/2014  . Left hip pain 04/18/2014  . Dehydration 01/23/2014  . Psoriasis 07/01/2013  . Loss of weight 03/17/2013  . Hypokalemia 03/17/2013  . Eczema 03/17/2013  . Protein-calorie malnutrition, severe ([place]) 03/17/2013    Past Surgical History:  Procedure Laterality Date  . FINGER SURGERY Right    middle finger        Home Medications    Prior to Admission medications   Medication Sig Start Date End Date Taking? Authorizing Provider  acetaminophen-codeine (TYLENOL #3) 300-30 MG tablet Take 1 tablet by mouth every 12 (twelve) hours as needed for moderate pain. Dx:P avascular necrosis of the hip 10/19/18  Yes Newlin, Charlane Ferretti, MD  aspirin-sod bicarb-citric acid (ALKA-SELTZER) 325 MG TBEF tablet Take 325 mg by mouth every 6 (six) hours as needed (heartburn).   Yes [provider]  atorvastatin (LIPITOR) 20 MG tablet Take 1 tablet (20 mg total) by mouth  daily. 10/19/18  Yes Newlin, Charlane Ferretti, MD  clobetasol cream (TEMOVATE) 0.05 % APPLY EXTERNALLY TO THE AFFECTED AREA TWICE DAILY Patient taking differently: Apply 1 application topically 2 (two) times daily as needed (skin irritation). APPLY EXTERNALLY TO THE AFFECTED AREA TWICE DAILY 06/17/17  Yes Jegede, Olugbemiga E, MD  gabapentin (NEURONTIN) 300 MG capsule Take 1 capsule (300 mg total) by mouth 2 (two) times daily. 10/19/18  Yes Newlin, Enobong, MD  insulin aspart (NOVOLOG) 100 UNIT/ML injection INJECT 0-12 UNITS INTO THE SKIN THREE TIMES DAILY WITH MEALS  AS PER SLIDING SCALE 11/18/18  Yes Newlin, Enobong, MD  insulin glargine (LANTUS) 100 UNIT/ML injection Inject 0.15 mLs (15 Units total) into the skin at bedtime. 10/19/18  Yes Charlott Rakes, MD  pantoprazole (PROTONIX) 40 MG tablet Take 1 tablet (40 mg total) by mouth daily. 10/19/18  Yes Charlott Rakes, MD  ACCU-CHEK SOFTCLIX LANCETS lancets Use as instructed 3 times daily. Dx: Type 1 DM 10/19/18   Charlott Rakes, MD  Blood Glucose Monitoring Suppl (ACCU-CHEK AVIVA PLUS) w/Device KIT TEST 3 TIMES DAILY 10/19/18   Charlott Rakes, MD  famotidine (PEPCID) 20 MG tablet Take 1 tablet (20 mg total) by mouth 2 (two) times daily. 11/23/18   Dorie Rank, MD  glucose blood (ACCU-CHEK AVIVA PLUS) test strip USE TO CHECK BLOOD SUGAR FOUR TIMES DAILY BEFORE MEALS AND AT BEDTIME 10/19/18   Newlin, Charlane Ferretti, MD  Insulin Syringe-Needle U-100 (B-D INS SYRINGE 2CC/29GX1/2") 29G X 1/2" 2 ML MISC Check Blood sugar TID and QHS 03/22/14   Jegede, Olugbemiga E, MD  ondansetron (ZOFRAN ODT) 8 MG disintegrating tablet Take 1 tablet (8 mg total) by mouth every 8 (eight) hours as needed for nausea or vomiting. 11/23/18   Dorie Rank, MD    Family History Family History  Problem Relation Age of Onset  . Hypertension Father   . Diabetes Other        multiple  . Lupus Cousin   . Stroke Maternal Grandmother   . Stroke Paternal Grandmother   . Colon cancer Neg Hx   . Stomach cancer Neg Hx   . Rectal cancer Neg Hx   . Esophageal cancer Neg Hx     Social History Social History   Tobacco Use  . Smoking status: Current Some Day Smoker    Packs/day: 0.25    Years: 10.00    Pack years: 2.50    Types: Cigarettes  . Smokeless tobacco: Never Used  Substance Use Topics  . Alcohol use: Yes    Alcohol/week: 1.0 - 2.0 standard drinks    Types: 1 - 2 Shots of liquor per week    Comment: last time tuesday  . Drug use: Yes    Types: Marijuana    Comment: smoked about 2 weeks ago     Allergies   Bee venom; Latex; and Penicillins    Review of Systems Review of Systems  All other systems reviewed and are negative.    Physical Exam Updated Vital Signs BP (!) 134/97   Pulse 92   Temp 98.4 F (36.9 C) (Oral)   Resp 19   Ht 1.778 m (_0 )   Wt 60 kg   SpO2 97%   BMI 18.98 kg/m   Physical Exam Vitals signs and nursing note reviewed.  Constitutional:      Appearance: He is well-developed. He is not toxic-appearing or diaphoretic.  HENT:     Head: Normocephalic and atraumatic.     Right Ear: External ear normal.  Left Ear: External ear normal.     Mouth/Throat:     Mouth: Mucous membranes are dry.  Eyes:     General: No scleral icterus.       Right eye: No discharge.        Left eye: No discharge.     Conjunctiva/sclera: Conjunctivae normal.  Neck:     Musculoskeletal: Neck supple.     Trachea: No tracheal deviation.  Cardiovascular:     Rate and Rhythm: Normal rate and regular rhythm.  Pulmonary:     Effort: Pulmonary effort is normal. No respiratory distress.     Breath sounds: Normal breath sounds. No stridor. No wheezing or rales.  Abdominal:     General: Bowel sounds are normal. There is no distension.     Palpations: Abdomen is soft.     Tenderness: There is no abdominal tenderness. There is no guarding or rebound.  Musculoskeletal:        General: No tenderness.  Skin:    General: Skin is warm and dry.     Findings: No rash.  Neurological:     Cranial Nerves: No cranial nerve deficit (no facial droop, extraocular movements intact, no slurred speech).     Sensory: No sensory deficit.     Motor: No abnormal muscle tone or seizure activity.     Coordination: Coordination normal.      ED Treatments / Results  Labs (all labs ordered are listed, but only abnormal results are displayed) Labs Reviewed  COMPREHENSIVE METABOLIC PANEL - Abnormal; Notable for the following components:      Result Value   Potassium 3.2 (*)    Chloride 85 (*)    CO2 39 (*)    Glucose, Bld 67 (*)     Creatinine, Ser 1.68 (*)    Total Protein 8.9 (*)    Albumin 5.1 (*)    AST 59 (*)    Alkaline Phosphatase 176 (*)    Total Bilirubin 1.6 (*)    GFR calc non Af Amer 49 (*)    GFR calc Af Amer 57 (*)    Anion gap 21 (*)    All other components within normal limits  CBC WITH DIFFERENTIAL/PLATELET - Abnormal; Notable for the following components:   RBC 5.93 (*)    Hemoglobin 17.4 (*)    HCT 52.3 (*)    All other components within normal limits  POCT I-STAT EG7 - Abnormal; Notable for the following components:   pH, Ven 7.590 (*)    pO2, Ven 28.0 (*)    Bicarbonate 45.9 (*)    TCO2 47 (*)    Acid-Base Excess 21.0 (*)    Potassium 3.0 (*)    Calcium, Ion 0.94 (*)    HCT 53.0 (*)    Hemoglobin 18.0 (*)    All other components within normal limits  CBG MONITORING, ED - Abnormal; Notable for the following components:   Glucose-Capillary 49 (*)    All other components within normal limits  CBG MONITORING, ED - Abnormal; Notable for the following components:   Glucose-Capillary 211 (*)    All other components within normal limits  LIPASE, BLOOD  URINALYSIS, ROUTINE W REFLEX MICROSCOPIC  I-STAT VENOUS BLOOD GAS, ED  CBG MONITORING, ED    EKG None  Radiology Dg Abd Acute W/chest  Result Date: 11/23/2018 CLINICAL DATA:  Vomiting for 2 days. EXAM: DG ABDOMEN ACUTE W/ 1V CHEST COMPARISON:  CT abdomen and pelvis 08/01/2017. Single-view of the chest 03/31/2016.  FINDINGS: Single-view of the chest demonstrates clear lungs and normal heart size. No pneumothorax or pleural effusion. Two views of the abdomen show no free intraperitoneal air. The bowel gas pattern is normal. Severe bilateral hip osteoarthritis is markedly advanced for age and unchanged in appearance. IMPRESSION: No acute finding chest or abdomen. Severe bilateral hip osteoarthritis appears unchanged. Electronically Signed   By: Inge Rise M.D.   On: 11/23/2018 11:36    Procedures Procedures (including critical care  time)  Medications Ordered in ED Medications  sodium chloride 0.9 % bolus 1,000 mL (0 mLs Intravenous Stopped 11/23/18 1331)    And  0.9 %  sodium chloride infusion ( Intravenous New Bag/Given 11/23/18 1336)  dextrose 10 % infusion ( Intravenous Stopped 11/23/18 1331)  glucose 4 GM chewable tablet (  Not Given 11/23/18 1218)  ondansetron (ZOFRAN) injection 4 mg (4 mg Intravenous Given 11/23/18 1147)  dextrose 50 % solution (  Given 11/23/18 1218)  sodium chloride 0.9 % bolus 1,000 mL (1,000 mLs Intravenous New Bag/Given 11/23/18 1427)     Initial Impression / Assessment and Plan / ED Course  I have reviewed the triage vital signs and the nursing notes.  Pertinent labs & imaging results that were available during my care of the patient were reviewed by me and considered in my medical decision making (see chart for details).  Clinical Course as of Nov 22 1541  Tue Nov 23, 2018  1343 Labs notable for acute kidney injury as well as mild hyperglycemia   [JK]  1415 Electrolyte abnormalities noted.  No signs of DKA.  Hypochloremia and metabolic alkalosis.  Likely related to dehydration.  Patient would like to try to drink some fluids at this time   [JK]  1542 he was able to eat and drink without further vomiting.   [HW]  3888 Patient would prefer to go home.   [JK]    Clinical Course User Index [JK] Dorie Rank, MD     Patient presented with nausea vomiting and dehydration.  His electrolytes were notable for hypochloremia and an elevated bicarb.  I suspect this is related to her contraction alkalosis from dehydration.  Patient was hydrated with IV fluids.  He was able to eat and drink without vomiting.  His blood sugar remained normal.  Suspect possible viral illness.  Since the patient is now able to eat and drink his vital signs are normal is feeling better I think is reasonable to have him try managing this at home.  Final Clinical Impressions(s) / ED Diagnoses   Final diagnoses:  Nausea  vomiting and diarrhea  Dehydration  AKI (acute kidney injury) [facility])    ED Discharge Orders         Ordered    ondansetron (ZOFRAN ODT) 8 MG disintegrating tablet  Every 8 hours PRN     11/23/18 1540    famotidine (PEPCID) 20 MG tablet  2 times daily     11/23/18 1540           Dorie Rank, MD 11/23/18 [phone]

## 2018-11-23 NOTE — ED Notes (Signed)
Discharge instructions discussed with Pt. Pt verbalized understanding. Pt stable and ambulatory.    

## 2018-11-23 NOTE — ED Notes (Signed)
MD Tomi Bamberger notified of CBG 49, pt mentating well, will give amp D50 and glucose chew, will continue to monitor.

## 2018-11-23 NOTE — Telephone Encounter (Signed)
Patient was called and wife was informed to take patient to the ed due to patient being dehydrated.

## 2018-11-23 NOTE — ED Notes (Addendum)
CBG 78. RN notified.

## 2018-11-23 NOTE — ED Triage Notes (Signed)
Pt arrives to ED from home with complaints of N/V/D since Sunday. EMS reports pt has not been able to keep anything down since. Pt states he feels dehydrated.

## 2018-11-25 ENCOUNTER — Encounter (HOSPITAL_COMMUNITY): Payer: Self-pay

## 2018-11-25 ENCOUNTER — Other Ambulatory Visit: Payer: Self-pay

## 2018-11-25 ENCOUNTER — Observation Stay (HOSPITAL_COMMUNITY)
Admission: EM | Admit: 2018-11-25 | Discharge: 2018-11-26 | Disposition: A | Discharge status: Home / Self Care | Payer: Medicare Other | Attending: Internal Medicine | Admitting: Internal Medicine

## 2018-11-25 DIAGNOSIS — Z794 Long term (current) use of insulin: Secondary | ICD-10-CM | POA: Insufficient documentation

## 2018-11-25 DIAGNOSIS — I4510 Unspecified right bundle-branch block: Secondary | ICD-10-CM | POA: Diagnosis not present

## 2018-11-25 DIAGNOSIS — M1990 Unspecified osteoarthritis, unspecified site: Secondary | ICD-10-CM | POA: Diagnosis not present

## 2018-11-25 DIAGNOSIS — F411 Generalized anxiety disorder: Secondary | ICD-10-CM | POA: Insufficient documentation

## 2018-11-25 DIAGNOSIS — Z8249 Family history of ischemic heart disease and other diseases of the circulatory system: Secondary | ICD-10-CM | POA: Diagnosis not present

## 2018-11-25 DIAGNOSIS — E876 Hypokalemia: Secondary | ICD-10-CM | POA: Diagnosis not present

## 2018-11-25 DIAGNOSIS — E860 Dehydration: Principal | ICD-10-CM | POA: Insufficient documentation

## 2018-11-25 DIAGNOSIS — K219 Gastro-esophageal reflux disease without esophagitis: Secondary | ICD-10-CM | POA: Diagnosis not present

## 2018-11-25 DIAGNOSIS — E872 Acidosis: Secondary | ICD-10-CM | POA: Diagnosis not present

## 2018-11-25 DIAGNOSIS — R110 Nausea: Secondary | ICD-10-CM | POA: Diagnosis not present

## 2018-11-25 DIAGNOSIS — F17210 Nicotine dependence, cigarettes, uncomplicated: Secondary | ICD-10-CM | POA: Diagnosis not present

## 2018-11-25 DIAGNOSIS — R112 Nausea with vomiting, unspecified: Secondary | ICD-10-CM | POA: Diagnosis present

## 2018-11-25 DIAGNOSIS — G8929 Other chronic pain: Secondary | ICD-10-CM | POA: Diagnosis not present

## 2018-11-25 DIAGNOSIS — R9431 Abnormal electrocardiogram [ECG] [EKG]: Secondary | ICD-10-CM | POA: Diagnosis present

## 2018-11-25 DIAGNOSIS — N179 Acute kidney failure, unspecified: Secondary | ICD-10-CM | POA: Diagnosis not present

## 2018-11-25 DIAGNOSIS — F1290 Cannabis use, unspecified, uncomplicated: Secondary | ICD-10-CM

## 2018-11-25 DIAGNOSIS — Z79899 Other long term (current) drug therapy: Secondary | ICD-10-CM | POA: Diagnosis not present

## 2018-11-25 DIAGNOSIS — F1210 Cannabis abuse, uncomplicated: Secondary | ICD-10-CM | POA: Diagnosis not present

## 2018-11-25 DIAGNOSIS — R000 Tachycardia, unspecified: Secondary | ICD-10-CM | POA: Diagnosis not present

## 2018-11-25 DIAGNOSIS — E873 Alkalosis: Secondary | ICD-10-CM | POA: Insufficient documentation

## 2018-11-25 DIAGNOSIS — I4581 Long QT syndrome: Secondary | ICD-10-CM | POA: Diagnosis not present

## 2018-11-25 DIAGNOSIS — R748 Abnormal levels of other serum enzymes: Secondary | ICD-10-CM | POA: Diagnosis present

## 2018-11-25 DIAGNOSIS — E109 Type 1 diabetes mellitus without complications: Secondary | ICD-10-CM | POA: Diagnosis not present

## 2018-11-25 DIAGNOSIS — E785 Hyperlipidemia, unspecified: Secondary | ICD-10-CM | POA: Diagnosis not present

## 2018-11-25 DIAGNOSIS — K529 Noninfective gastroenteritis and colitis, unspecified: Secondary | ICD-10-CM | POA: Diagnosis present

## 2018-11-25 DIAGNOSIS — I10 Essential (primary) hypertension: Secondary | ICD-10-CM | POA: Diagnosis not present

## 2018-11-25 HISTORY — DX: Cannabis use, unspecified, uncomplicated: F12.90

## 2018-11-25 HISTORY — DX: Hyperlipidemia, unspecified: E78.5

## 2018-11-25 LAB — GLUCOSE, CAPILLARY
Glucose-Capillary: 333 mg/dL — ABNORMAL HIGH (ref 70–99)
Glucose-Capillary: 177 mg/dL — ABNORMAL HIGH (ref 70–99)
Glucose-Capillary: 106 mg/dL — ABNORMAL HIGH (ref 70–99)
Glucose-Capillary: 38 mg/dL — CL (ref 70–99)
Glucose-Capillary: 97 mg/dL (ref 70–99)

## 2018-11-25 LAB — URINALYSIS, ROUTINE W REFLEX MICROSCOPIC
Glucose, UA: NEGATIVE mg/dL
Hgb urine dipstick: NEGATIVE
Ketones, ur: 20 mg/dL — AB
Leukocytes,Ua: NEGATIVE
Nitrite: NEGATIVE
Protein, ur: 100 mg/dL — AB
Specific Gravity, Urine: 1.027 (ref 1.005–1.030)
pH: 8 (ref 5.0–8.0)

## 2018-11-25 LAB — RAPID URINE DRUG SCREEN, HOSP PERFORMED
Amphetamines: NOT DETECTED
Barbiturates: NOT DETECTED
Benzodiazepines: NOT DETECTED
Cocaine: NOT DETECTED
Opiates: POSITIVE — AB
Tetrahydrocannabinol: POSITIVE — AB

## 2018-11-25 LAB — CBG MONITORING, ED
Glucose-Capillary: 119 mg/dL — ABNORMAL HIGH (ref 70–99)
Glucose-Capillary: 42 mg/dL — CL (ref 70–99)
Glucose-Capillary: 202 mg/dL — ABNORMAL HIGH (ref 70–99)

## 2018-11-25 LAB — BASIC METABOLIC PANEL
Anion gap: 13 (ref 5–15)
BUN: 9 mg/dL (ref 6–20)
CO2: 28 mmol/L (ref 22–32)
Calcium: 7.3 mg/dL — ABNORMAL LOW (ref 8.9–10.3)
Chloride: 94 mmol/L — ABNORMAL LOW (ref 98–111)
Creatinine, Ser: 0.96 mg/dL (ref 0.61–1.24)
GFR calc Af Amer: >60 mL/min (ref >60)
GFR calc non Af Amer: >60 mL/min (ref >60)
Glucose, Bld: 234 mg/dL — ABNORMAL HIGH (ref 70–99)
Potassium: 2.9 mmol/L — ABNORMAL LOW (ref 3.5–5.1)
Sodium: 135 mmol/L (ref 135–145)

## 2018-11-25 LAB — POCT I-STAT EG7
Acid-Base Excess: 17 mmol/L — ABNORMAL HIGH (ref 0.0–2.0)
Bicarbonate: 39.8 mmol/L — ABNORMAL HIGH (ref 20.0–28.0)
Calcium, Ion: 0.91 mmol/L — ABNORMAL LOW (ref 1.15–1.40)
HCT: 48 % (ref 39.0–52.0)
Hemoglobin: 16.3 g/dL (ref 13.0–17.0)
O2 Saturation: 36 %
Patient temperature: 37
Potassium: 2.9 mmol/L — ABNORMAL LOW (ref 3.5–5.1)
Sodium: 140 mmol/L (ref 135–145)
TCO2: 41 mmol/L — ABNORMAL HIGH (ref 22–32)
pCO2, Ven: 37.1 mmHg — ABNORMAL LOW (ref 44.0–60.0)
pH, Ven: 7.639 (ref 7.250–7.430)
pO2, Ven: 17 mmHg — CL (ref 32.0–45.0)

## 2018-11-25 LAB — CBC WITH DIFFERENTIAL/PLATELET
Abs Immature Granulocytes: 0.01 10*3/uL (ref 0.00–0.07)
Basophils Absolute: 0 10*3/uL (ref 0.0–0.1)
Basophils Relative: 1 %
Eosinophils Absolute: 0 10*3/uL (ref 0.0–0.5)
Eosinophils Relative: 1 %
HCT: 47.4 % (ref 39.0–52.0)
Hemoglobin: 15.7 g/dL (ref 13.0–17.0)
Immature Granulocytes: 0 %
Lymphocytes Relative: 34 %
Lymphs Abs: 1.5 10*3/uL (ref 0.7–4.0)
MCH: 29.2 pg (ref 26.0–34.0)
MCHC: 33.1 g/dL (ref 30.0–36.0)
MCV: 88.1 fL (ref 80.0–100.0)
Monocytes Absolute: 0.4 10*3/uL (ref 0.1–1.0)
Monocytes Relative: 9 %
Neutro Abs: 2.3 10*3/uL (ref 1.7–7.7)
Neutrophils Relative %: 55 %
Platelets: 249 10*3/uL (ref 150–400)
RBC: 5.38 MIL/uL (ref 4.22–5.81)
RDW: 12.2 % (ref 11.5–15.5)
WBC: 4.3 10*3/uL (ref 4.0–10.5)
nRBC: 0 % (ref 0.0–0.2)

## 2018-11-25 LAB — COMPREHENSIVE METABOLIC PANEL
ALT: 37 U/L (ref 0–44)
AST: 72 U/L — ABNORMAL HIGH (ref 15–41)
Albumin: 3.9 g/dL (ref 3.5–5.0)
Alkaline Phosphatase: 137 U/L — ABNORMAL HIGH (ref 38–126)
Anion gap: 14 (ref 5–15)
BUN: 11 mg/dL (ref 6–20)
CO2: 34 mmol/L — ABNORMAL HIGH (ref 22–32)
Calcium: 8.6 mg/dL — ABNORMAL LOW (ref 8.9–10.3)
Chloride: 91 mmol/L — ABNORMAL LOW (ref 98–111)
Creatinine, Ser: 1.22 mg/dL (ref 0.61–1.24)
GFR calc Af Amer: >60 mL/min (ref >60)
GFR calc non Af Amer: >60 mL/min (ref >60)
Glucose, Bld: 108 mg/dL — ABNORMAL HIGH (ref 70–99)
Potassium: 2.9 mmol/L — ABNORMAL LOW (ref 3.5–5.1)
Sodium: 139 mmol/L (ref 135–145)
Total Bilirubin: 1.8 mg/dL — ABNORMAL HIGH (ref 0.3–1.2)
Total Protein: 6.7 g/dL (ref 6.5–8.1)

## 2018-11-25 LAB — MAGNESIUM: Magnesium: 1.7 mg/dL (ref 1.7–2.4)

## 2018-11-25 MED ORDER — ONDANSETRON HCL 4 MG PO TABS: 4.0000 mg | ORAL_TABLET | Freq: Four times a day (QID) | ORAL | Status: DC | PRN

## 2018-11-25 MED ORDER — INSULIN GLARGINE 100 UNIT/ML ~~LOC~~ SOLN
15.0000 [IU] | Freq: Every day | SUBCUTANEOUS | Status: DC
  Filled 2018-11-25: qty 0.15

## 2018-11-25 MED ORDER — POTASSIUM CHLORIDE CRYS ER 20 MEQ PO TBCR
40.0000 meq | EXTENDED_RELEASE_TABLET | Freq: Once | ORAL | Status: DC
  Filled 2018-11-25: qty 2

## 2018-11-25 MED ORDER — DEXTROSE 50 % IV SOLN
1.0000 | Freq: Once | INTRAVENOUS | Status: AC
  Administered 2018-11-25: 50 mL via INTRAVENOUS

## 2018-11-25 MED ORDER — HYDROCODONE-ACETAMINOPHEN 5-325 MG PO TABS: 1.0000 | ORAL_TABLET | ORAL | Status: DC | PRN

## 2018-11-25 MED ORDER — PROMETHAZINE HCL 25 MG/ML IJ SOLN: 12.5000 mg | Freq: Four times a day (QID) | INTRAMUSCULAR | Status: DC | PRN

## 2018-11-25 MED ORDER — ACETAMINOPHEN 650 MG RE SUPP: 650.0000 mg | Freq: Four times a day (QID) | RECTAL | Status: DC | PRN

## 2018-11-25 MED ORDER — DEXTROSE 50 % IV SOLN
INTRAVENOUS | Status: AC
  Filled 2018-11-25: qty 50

## 2018-11-25 MED ORDER — POTASSIUM CHLORIDE CRYS ER 20 MEQ PO TBCR
40.0000 meq | EXTENDED_RELEASE_TABLET | ORAL | Status: AC
  Administered 2018-11-25 (×2): 40 meq via ORAL
  Filled 2018-11-25 (×2): qty 2

## 2018-11-25 MED ORDER — PROMETHAZINE HCL 25 MG PO TABS: 12.5000 mg | ORAL_TABLET | Freq: Four times a day (QID) | ORAL | Status: DC | PRN

## 2018-11-25 MED ORDER — SODIUM CHLORIDE 0.9 % IV BOLUS
1000.0000 mL | Freq: Once | INTRAVENOUS | Status: AC
  Administered 2018-11-25: 1000 mL via INTRAVENOUS

## 2018-11-25 MED ORDER — ATORVASTATIN CALCIUM 10 MG PO TABS
20.0000 mg | ORAL_TABLET | Freq: Every day | ORAL | Status: DC
  Administered 2018-11-25 – 2018-11-26 (×2): 20 mg via ORAL
  Filled 2018-11-25 (×2): qty 2

## 2018-11-25 MED ORDER — INSULIN GLARGINE 100 UNIT/ML ~~LOC~~ SOLN
15.0000 [IU] | Freq: Every day | SUBCUTANEOUS | Status: DC
  Administered 2018-11-25 – 2018-11-26 (×2): 15 [IU] via SUBCUTANEOUS
  Filled 2018-11-25 (×3): qty 0.15

## 2018-11-25 MED ORDER — PANTOPRAZOLE SODIUM 40 MG PO TBEC
40.0000 mg | DELAYED_RELEASE_TABLET | Freq: Every day | ORAL | Status: DC
  Administered 2018-11-25 – 2018-11-26 (×2): 40 mg via ORAL
  Filled 2018-11-25 (×2): qty 1

## 2018-11-25 MED ORDER — ONDANSETRON HCL 4 MG/2ML IJ SOLN: 4.0000 mg | Freq: Four times a day (QID) | INTRAMUSCULAR | Status: DC | PRN

## 2018-11-25 MED ORDER — ACETAMINOPHEN 325 MG PO TABS: 650.0000 mg | ORAL_TABLET | Freq: Four times a day (QID) | ORAL | Status: DC | PRN

## 2018-11-25 MED ORDER — GABAPENTIN 300 MG PO CAPS
300.0000 mg | ORAL_CAPSULE | Freq: Two times a day (BID) | ORAL | Status: DC
  Administered 2018-11-25 – 2018-11-26 (×3): 300 mg via ORAL
  Filled 2018-11-25 (×3): qty 1

## 2018-11-25 MED ORDER — POTASSIUM CHLORIDE 10 MEQ/100ML IV SOLN
10.0000 meq | Freq: Once | INTRAVENOUS | Status: AC
  Administered 2018-11-25: 10 meq via INTRAVENOUS
  Filled 2018-11-25: qty 100

## 2018-11-25 MED ORDER — ENOXAPARIN SODIUM 40 MG/0.4ML ~~LOC~~ SOLN
40.0000 mg | SUBCUTANEOUS | Status: DC
  Filled 2018-11-25 (×2): qty 0.4

## 2018-11-25 MED ORDER — SODIUM CHLORIDE 0.9 % IV SOLN
INTRAVENOUS | Status: DC
  Administered 2018-11-25 – 2018-11-26 (×3): via INTRAVENOUS

## 2018-11-25 MED ORDER — MAGNESIUM SULFATE 2 GM/50ML IV SOLN
2.0000 g | Freq: Once | INTRAVENOUS | Status: AC
  Administered 2018-11-25: 2 g via INTRAVENOUS
  Filled 2018-11-25: qty 50

## 2018-11-25 MED ORDER — PROMETHAZINE HCL 25 MG/ML IJ SOLN: 12.5000 mg | Freq: Once | INTRAMUSCULAR | Status: DC | PRN

## 2018-11-25 MED ORDER — INSULIN ASPART 100 UNIT/ML ~~LOC~~ SOLN
0.0000 [IU] | SUBCUTANEOUS | Status: DC
  Administered 2018-11-25: 7 [IU] via SUBCUTANEOUS
  Administered 2018-11-25: 2 [IU] via SUBCUTANEOUS

## 2018-11-25 MED ORDER — PROMETHAZINE HCL 25 MG/ML IJ SOLN
12.5000 mg | Freq: Once | INTRAMUSCULAR | Status: AC
  Administered 2018-11-25: 12.5 mg via INTRAVENOUS
  Filled 2018-11-25: qty 1

## 2018-11-25 NOTE — ED Notes (Addendum)
Pt given orange nuice. MD contacted. Pt then vomits OJ.

## 2018-11-25 NOTE — ED Notes (Signed)
Pt given po fluids and tolerating well. 

## 2018-11-25 NOTE — ED Notes (Signed)
Pt CBG was 202, notified Mildred(RN)

## 2018-11-25 NOTE — ED Triage Notes (Signed)
Since Sunday 4/5 has been c/o of n/v and anorexia.

## 2018-11-25 NOTE — ED Provider Notes (Addendum)
[facility] EMERGENCY DEPARTMENT Provider Note   CSN: [id number] Arrival date & time: 11/25/18  1700    History   Chief Complaint Chief Complaint  Patient presents with  . Nausea  . Emesis    HPI Jared Murray is a 42 y.o. male.     HPI   Jared Murray is a 43 y.o. male, with a history of type I DM, presenting to the ED with nausea and vomiting for the last 3 days. Patient was seen on 4/7 for the same complaint.  IV hydration was ordered, viral illness suspected, and Zofran prescribed.  Patient states he has been using the Zofran, but has continued to vomit.  He cannot quantify the number of episodes, but it is nonbloody and non-bilious. No sick contacts. Denies fever/chills, diarrhea, abdominal pain, urinary symptoms, chest pain, shortness of breath, cough, flank/back pain, or any other complaints.     Past Medical History:  Diagnosis Date  . Arthritis   . DKA (diabetic ketoacidoses) ([place])   . Eczema   . GERD (gastroesophageal reflux disease)   . Psoriasis   . Smoker   . Substance abuse ([place])   . Type 1 diabetes mellitus [facility])     Patient Active Problem List   Diagnosis Date Noted  . Chronic diarrhea of unknown origin 09/10/2017  . Intractable nausea and vomiting 08/02/2017  . Nausea and vomiting 08/01/2017  . Hyperglycemia due to type 1 diabetes mellitus ([place])   . Osteoarthritis 12/24/2016  . Hyperbilirubinemia   . Non compliance w medication regimen 01/07/2016  . Generalized anxiety disorder 11/21/2015  . Non compliance with medical treatment 11/21/2015  . DKA (diabetic ketoacidoses) ([place]) 11/02/2015  . Leukocytosis 11/02/2015  . Tobacco use disorder 09/13/2015  . Nausea & vomiting 08/23/2015  . Type 1 diabetes mellitus with hyperglycemia ([place])   . Diabetic ketoacidosis without coma associated with type 1 diabetes mellitus ([place])   . SIRS (systemic inflammatory response syndrome) ([facility])   . Avascular necrosis of bone of hip ([place])    . DKA (diabetic ketoacidosis) ([place]) 05/14/2015  . Diabetic ketoacidosis associated with other specified diabetes mellitus   . Lactic acidosis   . Cutaneous abscess of chest wall   . Diabetes type 1, uncontrolled ([place])   . DKA, type 1, not at goal [facility]) 04/03/2015  . Chest wall abscess 04/03/2015  . Avascular necrosis ([place]) 02/12/2015  . Type 1 diabetes mellitus ([place]) 01/29/2015  . Sepsis ([place]) 01/22/2015  . GERD (gastroesophageal reflux disease) 01/22/2015  . AKI (acute kidney injury) ([place]) 01/22/2015  . Hyperkalemia   . Viral gastroenteritis 07/01/2014  . Left hip pain 04/18/2014  . Dehydration 01/23/2014  . Psoriasis 07/01/2013  . Loss of weight 03/17/2013  . Hypokalemia 03/17/2013  . Eczema 03/17/2013  . Protein-calorie malnutrition, severe ([place]) 03/17/2013    Past Surgical History:  Procedure Laterality Date  . FINGER SURGERY Right    middle finger        Home Medications    Prior to Admission medications   Medication Sig Start Date End Date Taking? Authorizing Provider  ACCU-CHEK SOFTCLIX LANCETS lancets Use as instructed 3 times daily. Dx: Type 1 DM 10/19/18   Charlott Rakes, MD  acetaminophen-codeine (TYLENOL #3) 300-30 MG tablet Take 1 tablet by mouth every 12 (twelve) hours as needed for moderate pain. Dx:P avascular necrosis of the hip 10/19/18   Charlott Rakes, MD  aspirin-sod bicarb-citric acid (ALKA-SELTZER) 325 MG TBEF tablet Take 325 mg by mouth every  6 (six) hours as needed (heartburn).    [provider]  atorvastatin (LIPITOR) 20 MG tablet Take 1 tablet (20 mg total) by mouth daily. 10/19/18   Charlott Rakes, MD  Blood Glucose Monitoring Suppl (ACCU-CHEK AVIVA PLUS) w/Device KIT TEST 3 TIMES DAILY 10/19/18   Charlott Rakes, MD  clobetasol cream (TEMOVATE) 0.05 % APPLY EXTERNALLY TO THE AFFECTED AREA TWICE DAILY Patient taking differently: Apply 1 application topically 2 (two) times daily as needed (skin irritation). APPLY EXTERNALLY TO THE AFFECTED  AREA TWICE DAILY 06/17/17   Tresa Garter, MD  famotidine (PEPCID) 20 MG tablet Take 1 tablet (20 mg total) by mouth 2 (two) times daily. 11/23/18   Dorie Rank, MD  gabapentin (NEURONTIN) 300 MG capsule Take 1 capsule (300 mg total) by mouth 2 (two) times daily. 10/19/18   Charlott Rakes, MD  glucose blood (ACCU-CHEK AVIVA PLUS) test strip USE TO CHECK BLOOD SUGAR FOUR TIMES DAILY BEFORE MEALS AND AT BEDTIME 10/19/18   Newlin, Enobong, MD  insulin aspart (NOVOLOG) 100 UNIT/ML injection INJECT 0-12 UNITS INTO THE SKIN THREE TIMES DAILY WITH MEALS AS PER SLIDING SCALE 11/18/18   Newlin, Enobong, MD  insulin glargine (LANTUS) 100 UNIT/ML injection Inject 0.15 mLs (15 Units total) into the skin at bedtime. 10/19/18   Charlott Rakes, MD  Insulin Syringe-Needle U-100 (B-D INS SYRINGE 2CC/29GX1/2") 29G X 1/2" 2 ML MISC Check Blood sugar TID and QHS 03/22/14   Jegede, Olugbemiga E, MD  ondansetron (ZOFRAN ODT) 8 MG disintegrating tablet Take 1 tablet (8 mg total) by mouth every 8 (eight) hours as needed for nausea or vomiting. 11/23/18   Dorie Rank, MD  pantoprazole (PROTONIX) 40 MG tablet Take 1 tablet (40 mg total) by mouth daily. 10/19/18   Charlott Rakes, MD    Family History Family History  Problem Relation Age of Onset  . Hypertension Father   . Diabetes Other        multiple  . Lupus Cousin   . Stroke Maternal Grandmother   . Stroke Paternal Grandmother   . Colon cancer Neg Hx   . Stomach cancer Neg Hx   . Rectal cancer Neg Hx   . Esophageal cancer Neg Hx     Social History Social History   Tobacco Use  . Smoking status: Current Some Day Smoker    Packs/day: 0.25    Years: 10.00    Pack years: 2.50    Types: Cigarettes  . Smokeless tobacco: Never Used  Substance Use Topics  . Alcohol use: Yes    Alcohol/week: 1.0 - 2.0 standard drinks    Types: 1 - 2 Shots of liquor per week    Comment: last time tuesday  . Drug use: Yes    Types: Marijuana    Comment: smoked about 2 weeks ago      Allergies   Bee venom; Latex; and Penicillins   Review of Systems Review of Systems  Constitutional: Negative for chills and fever.  Respiratory: Negative for cough and shortness of breath.   Cardiovascular: Negative for chest pain.  Gastrointestinal: Positive for nausea and vomiting. Negative for abdominal pain, blood in stool and diarrhea.  Genitourinary: Negative for dysuria, flank pain and hematuria.  Musculoskeletal: Negative for back pain.  Neurological: Negative for syncope.  All other systems reviewed and are negative.    Physical Exam Updated Vital Signs BP (!) 125/101   Pulse 92   Temp 98.4 F (36.9 C) (Oral)   Resp 17   SpO2  98%   Physical Exam Vitals signs and nursing note reviewed.  Constitutional:      General: He is not in acute distress.    Appearance: He is well-developed. He is not diaphoretic.  HENT:     Head: Normocephalic and atraumatic.     Mouth/Throat:     Mouth: Mucous membranes are dry.     Pharynx: Oropharynx is clear.  Eyes:     Conjunctiva/sclera: Conjunctivae normal.  Neck:     Musculoskeletal: Neck supple.  Cardiovascular:     Rate and Rhythm: Normal rate and regular rhythm.     Pulses: Normal pulses.          Radial pulses are 2+ on the right side and 2+ on the left side.       Posterior tibial pulses are 2+ on the right side and 2+ on the left side.     Heart sounds: Normal heart sounds.     Comments: Tactile temperature in the extremities appropriate and equal bilaterally. Pulmonary:     Effort: Pulmonary effort is normal. No respiratory distress.     Breath sounds: Normal breath sounds.  Abdominal:     Palpations: Abdomen is soft.     Tenderness: There is no abdominal tenderness. There is no guarding.  Musculoskeletal:     Right lower leg: No edema.     Left lower leg: No edema.  Lymphadenopathy:     Cervical: No cervical adenopathy.  Skin:    General: Skin is warm and dry.  Neurological:     Mental Status: He is  alert.  Psychiatric:        Mood and Affect: Mood and affect normal.        Speech: Speech normal.        Behavior: Behavior normal.      ED Treatments / Results  Labs (all labs ordered are listed, but only abnormal results are displayed) Labs Reviewed  COMPREHENSIVE METABOLIC PANEL - Abnormal; Notable for the following components:      Result Value   Potassium 2.9 (*)    Chloride 91 (*)    CO2 34 (*)    Glucose, Bld 108 (*)    Calcium 8.6 (*)    AST 72 (*)    Alkaline Phosphatase 137 (*)    Total Bilirubin 1.8 (*)    All other components within normal limits  RAPID URINE DRUG SCREEN, HOSP PERFORMED - Abnormal; Notable for the following components:   Opiates POSITIVE (*)    Tetrahydrocannabinol POSITIVE (*)    All other components within normal limits  URINALYSIS, ROUTINE W REFLEX MICROSCOPIC - Abnormal; Notable for the following components:   Color, Urine AMBER (*)    Bilirubin Urine SMALL (*)    Ketones, ur 20 (*)    Protein, ur 100 (*)    Bacteria, UA FEW (*)    All other components within normal limits  CBG MONITORING, ED - Abnormal; Notable for the following components:   Glucose-Capillary 119 (*)    All other components within normal limits  POCT I-STAT EG7 - Abnormal; Notable for the following components:   pH, Ven 7.639 (*)    pCO2, Ven 37.1 (*)    pO2, Ven 17.0 (*)    Bicarbonate 39.8 (*)    TCO2 41 (*)    Acid-Base Excess 17.0 (*)    Potassium 2.9 (*)    Calcium, Ion 0.91 (*)    All other components within normal limits  CBC WITH  DIFFERENTIAL/PLATELET  MAGNESIUM  I-STAT VENOUS BLOOD GAS, ED    EKG EKG Interpretation  Date/Time:  Thursday November 25 2018 04:49:09 EDT Ventricular Rate:  91 PR Interval:    QRS Duration: 126 QT Interval:  409 QTC Calculation: 504 R Axis:   73 Text Interpretation:  Sinus rhythm Biatrial enlargement Right bundle branch block Confirmed by Addison Lank [phone]) on 11/25/2018 6:02:29 AM Also confirmed by Addison Lank  [phone]), editor Philomena Doheny [phone])  on 11/25/2018 7:22:43 AM   Radiology Dg Abd Acute W/chest  Result Date: 11/23/2018 CLINICAL DATA:  Vomiting for 2 days. EXAM: DG ABDOMEN ACUTE W/ 1V CHEST COMPARISON:  CT abdomen and pelvis 08/01/2017. Single-view of the chest 03/31/2016. FINDINGS: Single-view of the chest demonstrates clear lungs and normal heart size. No pneumothorax or pleural effusion. Two views of the abdomen show no free intraperitoneal air. The bowel gas pattern is normal. Severe bilateral hip osteoarthritis is markedly advanced for age and unchanged in appearance. IMPRESSION: No acute finding chest or abdomen. Severe bilateral hip osteoarthritis appears unchanged. Electronically Signed   By: Inge Rise M.D.   On: 11/23/2018 11:36    Procedures Procedures (including critical care time)  Medications Ordered in ED Medications  potassium chloride SA (K-DUR,KLOR-CON) CR tablet 40 mEq (40 mEq Oral Not Given 11/25/18 0557)  potassium chloride 10 mEq in 100 mL IVPB (10 mEq Intravenous New Bag/Given 11/25/18 0707)  sodium chloride 0.9 % bolus 1,000 mL (1,000 mLs Intravenous New Bag/Given 11/25/18 0405)    Followed by  sodium chloride 0.9 % bolus 1,000 mL (1,000 mLs Intravenous New Bag/Given 11/25/18 0406)  promethazine (PHENERGAN) injection 12.5 mg (12.5 mg Intravenous Given 11/25/18 0405)  potassium chloride 10 mEq in 100 mL IVPB (0 mEq Intravenous Stopped 11/25/18 0645)     Initial Impression / Assessment and Plan / ED Course  I have reviewed the triage vital signs and the nursing notes.  Pertinent labs & imaging results that were available during my care of the patient were reviewed by me and considered in my medical decision making (see chart for details).  Clinical Course as of Nov 24 724  Thu Nov 25, 2018  9381 Patient states he feels much better.    [SJ]  [id number] Spoke with Dr. Maylene Roes, hospitalist. Agrees to admit patient for observation.   [SJ]    Clinical Course User Index [SJ]  Barbi Kumagai C, PA-C       Patient presents with nausea and vomiting. Patient is nontoxic appearing, afebrile, not tachycardic, not tachypneic, not hypotensive, maintains excellent SPO2 on room air, and is in no apparent distress.  Dry on exam. He had no episodes of vomiting during my time with the patient.  Rehydrated with IV fluids.  Found to be hypokalemic, which was addressed with IV and oral replenishment.  IV fluids initially running slowly, which I asked RN to correct by putting them on a pump. Ketonuria likely from dehydration.  Metabolic alkalosis, which I suspect may be from his vomiting, is worsened from 4/7.  Cannabinoid hyperemesis syndrome is a consideration in this patient.  Patient admitted for continued hydration and management of his N/V and metabolic alkalosis. Outpatient management would be complicated by patient's prolonged QT interval, limiting antiemetic choices.  Findings and plan of care discussed with Addison Lank, MD.    Vitals:   11/25/18 0432 11/25/18 0500 11/25/18 0600 11/25/18 0630  BP: 104/78 98/76 (!) 127/95 (!) 125/101  Pulse:  88  92  Resp: (!) 25 11 18  17  Temp:      TempSrc:      SpO2:  100%  98%     Final Clinical Impressions(s) / ED Diagnoses   Final diagnoses:  Non-intractable vomiting with nausea, unspecified vomiting type  Prolonged QT interval  Dehydration    ED Discharge Orders    None       Layla Maw 11/25/18 0718    Lorayne Bender, PA-C 11/25/18 0727    Fatima Blank, MD 11/27/18 [phone]

## 2018-11-25 NOTE — ED Notes (Signed)
Multiple requests have been made for urine specimen.

## 2018-11-25 NOTE — ED Notes (Signed)
ED TO INPATIENT HANDOFF REPORT  ED Nurse Name and Phone #: [phone]   S Name/Age/Gender Jared Murray 43 y.o. male Room/Bed: 015C/015C  Code Status   Code Status: Prior  Home/SNF/Other Home Patient oriented to: self, place, time and situation Is this baseline? Yes   Triage Complete: Triage complete  Chief Complaint vomiting  Triage Note Since Sunday 4/5 has been c/o of n/v and anorexia.   Allergies Allergies  Allergen Reactions  . Bee Venom   . Latex Itching and Rash  . Penicillins Other (See Comments)    Childhood allergy Has patient had a PCN reaction causing immediate rash, facial/tongue/throat swelling, SOB or lightheadedness with hypotension: NO Has patient had a PCN reaction causing severe rash involving mucus membranes or skin necrosis:NO Has patient had a PCN reaction that required hospitalization NO Has patient had a PCN reaction occurring within the last 10 years: NO If all of the above answers are "NO", then may proceed with Cephalosporin use.    Level of Care/Admitting Diagnosis ED Disposition    ED Disposition Condition Comment   Admit  Hospital Area: [facility] [[id number]]  Level of Care: Telemetry Medical [104]  I expect the patient will be discharged within 24 hours: Yes  LOW acuity---Tx typically complete <24 hrs---ACUTE conditions typically can be evaluated <24 hours---LABS likely to return to acceptable levels <24 hours---IS near functional baseline---EXPECTED to return to current living arrangement---NOT newly hypoxic: Meets criteria for 5C-Observation unit  Diagnosis: Intractable nausea and vomiting [353614]  Admitting Physician: Dessa Phi [[id number]]  Attending Physician: Dessa Phi [id number]  PT Class (Do Not Modify): Observation [104]  PT Acc Code (Do Not Modify): Observation [10022]       B Medical/Surgery History Past Medical History:  Diagnosis Date  . Arthritis   . DKA (diabetic ketoacidoses) ([place])    . Eczema   . GERD (gastroesophageal reflux disease)   . Psoriasis   . Smoker   . Substance abuse ([place])   . Type 1 diabetes mellitus ([place])    Past Surgical History:  Procedure Laterality Date  . FINGER SURGERY Right    middle finger     A IV Location/Drains/Wounds Patient Lines/Drains/Airways Status   Active Line/Drains/Airways    Name:   Placement date:   Placement time:   Site:   Days:   Peripheral IV 11/25/18 Left Antecubital   11/25/18    0342    Antecubital   less than 1          Intake/Output Last 24 hours No intake or output data in the 24 hours ending 11/25/18 0851  Labs/Imaging Results for orders placed or performed during the hospital encounter of 11/25/18 (from the past 48 hour(s))  CBG monitoring, ED     Status: Abnormal   Collection Time: 11/25/18  3:40 AM  Result Value Ref Range   Glucose-Capillary 119 (H) 70 - 99 mg/dL  CBC with Differential     Status: None   Collection Time: 11/25/18  3:52 AM  Result Value Ref Range   WBC 4.3 4.0 - 10.5 K/uL   RBC 5.38 4.22 - 5.81 MIL/uL   Hemoglobin 15.7 13.0 - 17.0 g/dL   HCT 47.4 39.0 - 52.0 %   MCV 88.1 80.0 - 100.0 fL   MCH 29.2 26.0 - 34.0 pg   MCHC 33.1 30.0 - 36.0 g/dL   RDW 12.2 11.5 - 15.5 %   Platelets 249 150 - 400 K/uL   nRBC 0.0 0.0 -  0.2 %   Neutrophils Relative % 55 %   Neutro Abs 2.3 1.7 - 7.7 K/uL   Lymphocytes Relative 34 %   Lymphs Abs 1.5 0.7 - 4.0 K/uL   Monocytes Relative 9 %   Monocytes Absolute 0.4 0.1 - 1.0 K/uL   Eosinophils Relative 1 %   Eosinophils Absolute 0.0 0.0 - 0.5 K/uL   Basophils Relative 1 %   Basophils Absolute 0.0 0.0 - 0.1 K/uL   Immature Granulocytes 0 %   Abs Immature Granulocytes 0.01 0.00 - 0.07 K/uL    Comment: Performed at [facility] [address], [place], [place] [address]  Comprehensive metabolic panel     Status: Abnormal   Collection Time: 11/25/18  3:52 AM  Result Value Ref Range   Sodium 139 135 - 145 mmol/L   Potassium 2.9 (L) 3.5 - 5.1  mmol/L   Chloride 91 (L) 98 - 111 mmol/L   CO2 34 (H) 22 - 32 mmol/L   Glucose, Bld 108 (H) 70 - 99 mg/dL   BUN 11 6 - 20 mg/dL   Creatinine, Ser 1.22 0.61 - 1.24 mg/dL   Calcium 8.6 (L) 8.9 - 10.3 mg/dL   Total Protein 6.7 6.5 - 8.1 g/dL   Albumin 3.9 3.5 - 5.0 g/dL   AST 72 (H) 15 - 41 U/L   ALT 37 0 - 44 U/L   Alkaline Phosphatase 137 (H) 38 - 126 U/L   Total Bilirubin 1.8 (H) 0.3 - 1.2 mg/dL   GFR calc non Af Amer >60 >60 mL/min   GFR calc Af Amer >60 >60 mL/min   Anion gap 14 5 - 15    Comment: Performed at [facility], [place] [address] [place], [place] [address]  POCT I-Stat EG7     Status: Abnormal   Collection Time: 11/25/18  3:58 AM  Result Value Ref Range   pH, Ven 7.639 (HH) 7.250 - 7.430   pCO2, Ven 37.1 (L) 44.0 - 60.0 mmHg   pO2, Ven 17.0 (LL) 32.0 - 45.0 mmHg   Bicarbonate 39.8 (H) 20.0 - 28.0 mmol/L   TCO2 41 (H) 22 - 32 mmol/L   O2 Saturation 36.0 %   Acid-Base Excess 17.0 (H) 0.0 - 2.0 mmol/L   Sodium 140 135 - 145 mmol/L   Potassium 2.9 (L) 3.5 - 5.1 mmol/L   Calcium, Ion 0.91 (L) 1.15 - 1.40 mmol/L   HCT 48.0 39.0 - 52.0 %   Hemoglobin 16.3 13.0 - 17.0 g/dL   Patient temperature 37.0 C    Sample type VENOUS    Comment NOTIFIED PHYSICIAN   Urine rapid drug screen (hosp performed)     Status: Abnormal   Collection Time: 11/25/18  5:39 AM  Result Value Ref Range   Opiates POSITIVE (A) NONE DETECTED   Cocaine NONE DETECTED NONE DETECTED   Benzodiazepines NONE DETECTED NONE DETECTED   Amphetamines NONE DETECTED NONE DETECTED   Tetrahydrocannabinol POSITIVE (A) NONE DETECTED   Barbiturates NONE DETECTED NONE DETECTED    Comment: (NOTE) DRUG SCREEN FOR MEDICAL PURPOSES ONLY.  IF CONFIRMATION IS NEEDED FOR ANY PURPOSE, NOTIFY LAB WITHIN 5 DAYS. LOWEST DETECTABLE LIMITS FOR URINE DRUG SCREEN Drug Class                     Cutoff (ng/mL) Amphetamine and metabolites    1000 Barbiturate and metabolites    200 Benzodiazepine  546 Tricyclics and metabolites     300 Opiates and metabolites        300 Cocaine and metabolites        300 THC                            50 Performed at [facility], [place] [address] [place], [place] [address]   Urinalysis, Routine w reflex microscopic     Status: Abnormal   Collection Time: 11/25/18  5:39 AM  Result Value Ref Range   Color, Urine AMBER (A) YELLOW    Comment: BIOCHEMICALS MAY BE AFFECTED BY COLOR   APPearance CLEAR CLEAR   Specific Gravity, Urine 1.027 1.005 - 1.030   pH 8.0 5.0 - 8.0   Glucose, UA NEGATIVE NEGATIVE mg/dL   Hgb urine dipstick NEGATIVE NEGATIVE   Bilirubin Urine SMALL (A) NEGATIVE   Ketones, ur 20 (A) NEGATIVE mg/dL   Protein, ur 100 (A) NEGATIVE mg/dL   Nitrite NEGATIVE NEGATIVE   Leukocytes,Ua NEGATIVE NEGATIVE   RBC / HPF 0-5 0 - 5 RBC/hpf   WBC, UA 0-5 0 - 5 WBC/hpf   Bacteria, UA FEW (A) NONE SEEN   Squamous Epithelial / LPF 0-5 0 - 5   Mucus PRESENT     Comment: Performed at [facility], [address], [place], [place] [address]  Magnesium     Status: None   Collection Time: 11/25/18  7:21 AM  Result Value Ref Range   Magnesium 1.7 1.7 - 2.4 mg/dL    Comment: Performed at [facility] [address], [place], [place] [address]  CBG monitoring, ED     Status: Abnormal   Collection Time: 11/25/18  7:54 AM  Result Value Ref Range   Glucose-Capillary 42 (LL) 70 - 99 mg/dL   Comment 1 Notify RN    Comment 2 Document in Chart   CBG monitoring, ED     Status: Abnormal   Collection Time: 11/25/18  8:36 AM  Result Value Ref Range   Glucose-Capillary 202 (H) 70 - 99 mg/dL   Comment 1 Notify RN    Comment 2 Document in Chart    Dg Abd Acute W/chest  Result Date: 11/23/2018 CLINICAL DATA:  Vomiting for 2 days. EXAM: DG ABDOMEN ACUTE W/ 1V CHEST COMPARISON:  CT abdomen and pelvis 08/01/2017. Single-view of the chest 03/31/2016. FINDINGS: Single-view of the chest demonstrates clear lungs and normal heart size. No  pneumothorax or pleural effusion. Two views of the abdomen show no free intraperitoneal air. The bowel gas pattern is normal. Severe bilateral hip osteoarthritis is markedly advanced for age and unchanged in appearance. IMPRESSION: No acute finding chest or abdomen. Severe bilateral hip osteoarthritis appears unchanged. Electronically Signed   By: Inge Rise M.D.   On: 11/23/2018 11:36    Pending Labs [employer] (From admission, onward)    Start     Ordered   Signed and Held  HIV antibody (Routine Testing)  Once,   R     Signed and Held   Signed and Held  CBC  (enoxaparin (LOVENOX)    CrCl >/= 30 ml/min)  Once,   R    Comments:  Baseline for enoxaparin therapy IF NOT ALREADY DRAWN.  Notify MD if PLT < 100 K.    Signed and Held   Signed and Held  Creatinine, serum  (enoxaparin (LOVENOX)    CrCl >/= 30  ml/min)  Once,   R    Comments:  Baseline for enoxaparin therapy IF NOT ALREADY DRAWN.    Signed and Held   Signed and Held  Creatinine, serum  (enoxaparin (LOVENOX)    CrCl >/= 30 ml/min)  Weekly,   R    Comments:  while on enoxaparin therapy    Signed and Held   Signed and Held  CBC  Tomorrow morning,   R     Signed and Held   Signed and Held  Basic metabolic panel  Tomorrow morning,   R     Signed and Held   Signed and Held  Magnesium  Tomorrow morning,   R     Signed and Held   Signed and Held  Basic metabolic panel  Once-Timed,   R     Signed and Held          Vitals/Pain Today's Vitals   11/25/18 0745 11/25/18 0800 11/25/18 0815 11/25/18 0830  BP:  (!) 144/98  (!) 152/98  Pulse:  (!) 121  77  Resp: 11 15 19 13   Temp:      TempSrc:      SpO2:  100%  99%  PainSc:        Isolation Precautions No active isolations  Medications Medications  potassium chloride SA (K-DUR,KLOR-CON) CR tablet 40 mEq (40 mEq Oral Not Given 11/25/18 0557)  promethazine (PHENERGAN) injection 12.5 mg (has no administration in time range)  dextrose 50 % solution (  Not Given 11/25/18  0830)  sodium chloride 0.9 % bolus 1,000 mL (0 mLs Intravenous Stopped 11/25/18 0752)    Followed by  sodium chloride 0.9 % bolus 1,000 mL (0 mLs Intravenous Stopped 11/25/18 0832)  promethazine (PHENERGAN) injection 12.5 mg (12.5 mg Intravenous Given 11/25/18 0405)  potassium chloride 10 mEq in 100 mL IVPB (0 mEq Intravenous Stopped 11/25/18 0645)  potassium chloride 10 mEq in 100 mL IVPB (0 mEq Intravenous Stopped 11/25/18 0752)  dextrose 50 % solution 50 mL (50 mLs Intravenous Given 11/25/18 0809)    Mobility walks Low fall risk   Focused Assessments Cardiac Assessment Handoff:  Cardiac Rhythm: Normal sinus rhythm Lab Results  Component Value Date   CKTOTAL 68 08/23/2015   TROPONINI <0.03 04/02/2016   No results found for: DDIMER Does the Patient currently have chest pain? No     R Recommendations: See Admitting Provider Note  Report given to:   Additional Notes:

## 2018-11-25 NOTE — Progress Notes (Signed)
Pt admitted to 5W room 8. A&O x4. VS stable. Tele monitor placed. All questions/concerns addressed. Call bell within reach. Will continue to monitor.

## 2018-11-25 NOTE — ED Notes (Signed)
Pt CBG was 42, notified Mildred(RN)

## 2018-11-25 NOTE — H&P (Addendum)
History and Physical    Jared Murray APO:[id number] DOB: 02-18-1976 DOA: 11/25/2018  PCP: Charlott Rakes, MD  Patient coming from: Home, lives with wife and kids  Chief Complaint: Nausea, vomiting   HPI: Jared Murray is a 43 y.o. male with medical history significant of type 1 DM, substance abuse, who presents with intractable nausea and vomiting.  He states that this has been going on for a few days.  He was evaluated in the emergency department on 4/7 with same.  At that time labs were notable for acute kidney injury, hypokalemia.  He was discharged home after receiving IV fluids.  However he now returns due to lack of improvement.  He denies any fevers, chest pain, shortness of breath, abdominal pain, diarrhea, dysuria.  He has not been able to keep down much fluid or food.  He does use marijuana on occasion to increase his appetite.  He is currently under care of pain clinic. No known sick contacts; wife and kids feeling well at home.   ED Course: Labs reveal metabolic alkalosis, hypokalemia.  Patient not in DKA.  UDS positive for opiates and THC.  He was given IV and oral potassium supplements.  EKG revealed prolonged QT interval.  Patient referred to observation for intractable nausea, vomiting as well as electrolyte replacements.  Review of Systems: As per HPI otherwise 10 point review of systems negative.   Past Medical History:  Diagnosis Date  . Arthritis   . DKA (diabetic ketoacidoses) ([place])   . Eczema   . GERD (gastroesophageal reflux disease)   . Psoriasis   . Smoker   . Substance abuse ([place])   . Type 1 diabetes mellitus ([place])     Past Surgical History:  Procedure Laterality Date  . FINGER SURGERY Right    middle finger     reports that he has been smoking cigarettes. He has a 2.50 pack-year smoking history. He has never used smokeless tobacco. He reports current alcohol use of about 1.0 - 2.0 standard drinks of alcohol per week. He reports current drug use.  Drug: Marijuana.  Allergies  Allergen Reactions  . Bee Venom   . Latex Itching and Rash  . Penicillins Other (See Comments)    Childhood allergy Has patient had a PCN reaction causing immediate rash, facial/tongue/throat swelling, SOB or lightheadedness with hypotension: NO Has patient had a PCN reaction causing severe rash involving mucus membranes or skin necrosis:NO Has patient had a PCN reaction that required hospitalization NO Has patient had a PCN reaction occurring within the last 10 years: NO If all of the above answers are "NO", then may proceed with Cephalosporin use.    Family History  Problem Relation Age of Onset  . Hypertension Father   . Diabetes Other        multiple  . Lupus Cousin   . Stroke Maternal Grandmother   . Stroke Paternal Grandmother   . Colon cancer Neg Hx   . Stomach cancer Neg Hx   . Rectal cancer Neg Hx   . Esophageal cancer Neg Hx     Prior to Admission medications   Medication Sig Start Date End Date Taking? Authorizing Provider  ACCU-CHEK SOFTCLIX LANCETS lancets Use as instructed 3 times daily. Dx: Type 1 DM 10/19/18   Charlott Rakes, MD  acetaminophen-codeine (TYLENOL #3) 300-30 MG tablet Take 1 tablet by mouth every 12 (twelve) hours as needed for moderate pain. Dx:P avascular necrosis of the hip 10/19/18   Charlott Rakes,  MD  aspirin-sod bicarb-citric acid (ALKA-SELTZER) 325 MG TBEF tablet Take 325 mg by mouth every 6 (six) hours as needed (heartburn).    [provider]  atorvastatin (LIPITOR) 20 MG tablet Take 1 tablet (20 mg total) by mouth daily. 10/19/18   Charlott Rakes, MD  Blood Glucose Monitoring Suppl (ACCU-CHEK AVIVA PLUS) w/Device KIT TEST 3 TIMES DAILY 10/19/18   Charlott Rakes, MD  clobetasol cream (TEMOVATE) 0.05 % APPLY EXTERNALLY TO THE AFFECTED AREA TWICE DAILY Patient taking differently: Apply 1 application topically 2 (two) times daily as needed (skin irritation). APPLY EXTERNALLY TO THE AFFECTED AREA TWICE DAILY  06/17/17   Tresa Garter, MD  famotidine (PEPCID) 20 MG tablet Take 1 tablet (20 mg total) by mouth 2 (two) times daily. 11/23/18   Dorie Rank, MD  gabapentin (NEURONTIN) 300 MG capsule Take 1 capsule (300 mg total) by mouth 2 (two) times daily. 10/19/18   Charlott Rakes, MD  glucose blood (ACCU-CHEK AVIVA PLUS) test strip USE TO CHECK BLOOD SUGAR FOUR TIMES DAILY BEFORE MEALS AND AT BEDTIME 10/19/18   Newlin, Enobong, MD  insulin aspart (NOVOLOG) 100 UNIT/ML injection INJECT 0-12 UNITS INTO THE SKIN THREE TIMES DAILY WITH MEALS AS PER SLIDING SCALE 11/18/18   Newlin, Enobong, MD  insulin glargine (LANTUS) 100 UNIT/ML injection Inject 0.15 mLs (15 Units total) into the skin at bedtime. 10/19/18   Charlott Rakes, MD  Insulin Syringe-Needle U-100 (B-D INS SYRINGE 2CC/29GX1/2") 29G X 1/2" 2 ML MISC Check Blood sugar TID and QHS 03/22/14   Jegede, Olugbemiga E, MD  ondansetron (ZOFRAN ODT) 8 MG disintegrating tablet Take 1 tablet (8 mg total) by mouth every 8 (eight) hours as needed for nausea or vomiting. 11/23/18   Dorie Rank, MD  pantoprazole (PROTONIX) 40 MG tablet Take 1 tablet (40 mg total) by mouth daily. 10/19/18   Charlott Rakes, MD    Physical Exam: Vitals:   11/25/18 0432 11/25/18 0500 11/25/18 0600 11/25/18 0630  BP: 104/78 98/76 (!) 127/95 (!) 125/101  Pulse:  88  92  Resp: (!) _0 Temp:      TempSrc:      SpO2:  100%  98%     Constitutional: NAD, calm, comfortable Eyes: PERRL, lids and conjunctivae normal ENMT: Mucous membranes are dry.  Neck: normal, supple, no masses, no thyromegaly Respiratory: clear to auscultation bilaterally, no wheezing, no crackles. Normal respiratory effort. No accessory muscle use.  Cardiovascular: Regular rate and rhythm, no murmurs / rubs / gallops. No extremity edema.  Abdomen: no tenderness, no masses palpated. No hepatosplenomegaly. Bowel sounds positive.  Musculoskeletal: no clubbing / cyanosis. No joint deformity upper and lower  extremities. Good ROM, no contractures. Normal muscle tone.  Skin: no rashes, lesions, ulcers on exposed skin Neurologic: CN 2-12 grossly intact. Strength 5/5 in all 4.  Speech clear Psychiatric: Normal judgment and insight. Alert and oriented x 3. Normal mood.   Labs on Admission: I have personally reviewed following labs and imaging studies  CBC: Recent Labs  Lab 11/23/18 1150 11/23/18 1159 11/25/18 0352 11/25/18 0358  WBC 8.8  --  4.3  --   NEUTROABS 6.6  --  2.3  --   HGB 17.4* 18.0* 15.7 16.3  HCT 52.3* 53.0* 47.4 48.0  MCV 88.2  --  88.1  --   PLT 338  --  249  --    Basic Metabolic Panel: Recent Labs  Lab 11/23/18 1150 11/23/18 1159 11/25/18 0352 11/25/18 0358  NA  145 143 139 140  K 3.2* 3.0* 2.9* 2.9*  CL 85*  --  91*  --   CO2 39*  --  34*  --   GLUCOSE 67*  --  108*  --   BUN 18  --  11  --   CREATININE 1.68*  --  1.22  --   CALCIUM 10.0  --  8.6*  --    GFR: Estimated Creatinine Clearance: 66.9 mL/min (by C-G formula based on SCr of 1.22 mg/dL). Liver Function Tests: Recent Labs  Lab 11/23/18 1150 11/25/18 0352  AST 59* 72*  ALT 33 37  ALKPHOS 176* 137*  BILITOT 1.6* 1.8*  PROT 8.9* 6.7  ALBUMIN 5.1* 3.9   Recent Labs  Lab 11/23/18 1150  LIPASE 16   No results for input(s): AMMONIA in the last 168 hours. Coagulation Profile: No results for input(s): INR, PROTIME in the last 168 hours. Cardiac Enzymes: No results for input(s): CKTOTAL, CKMB, CKMBINDEX, TROPONINI in the last 168 hours. BNP (last 3 results) No results for input(s): PROBNP in the last 8760 hours. HbA1C: No results for input(s): HGBA1C in the last 72 hours. CBG: Recent Labs  Lab 11/23/18 1044 11/23/18 1206 11/23/18 1253 11/25/18 0340  GLUCAP 78 49* 211* 119*   Lipid Profile: No results for input(s): CHOL, HDL, LDLCALC, TRIG, CHOLHDL, LDLDIRECT in the last 72 hours. Thyroid Function Tests: No results for input(s): TSH, T4TOTAL, FREET4, T3FREE, THYROIDAB in the last 72  hours. Anemia Panel: No results for input(s): VITAMINB12, FOLATE, FERRITIN, TIBC, IRON, RETICCTPCT in the last 72 hours. Urine analysis:    Component Value Date/Time   COLORURINE AMBER (A) 11/25/2018 0539   APPEARANCEUR CLEAR 11/25/2018 0539   LABSPEC 1.027 11/25/2018 0539   PHURINE 8.0 11/25/2018 0539   GLUCOSEU NEGATIVE 11/25/2018 0539   HGBUR NEGATIVE 11/25/2018 0539   BILIRUBINUR SMALL (A) 11/25/2018 0539   BILIRUBINUR neg 03/16/2015 1230   KETONESUR 20 (A) 11/25/2018 0539   PROTEINUR 100 (A) 11/25/2018 0539   UROBILINOGEN 0.2 06/25/2015 0913   NITRITE NEGATIVE 11/25/2018 0539   LEUKOCYTESUR NEGATIVE 11/25/2018 0539   Sepsis Labs: !!!!!!!!!!!!!!!!!!!!!!!!!!!!!!!!!!!!!!!!!!!! _0 (procalcitonin:4,lacticidven:4) )No results found for this or any previous visit (from the past 240 hour(s)).   Radiological Exams on Admission: Dg Abd Acute W/chest  Result Date: 11/23/2018 CLINICAL DATA:  Vomiting for 2 days. EXAM: DG ABDOMEN ACUTE W/ 1V CHEST COMPARISON:  CT abdomen and pelvis 08/01/2017. Single-view of the chest 03/31/2016. FINDINGS: Single-view of the chest demonstrates clear lungs and normal heart size. No pneumothorax or pleural effusion. Two views of the abdomen show no free intraperitoneal air. The bowel gas pattern is normal. Severe bilateral hip osteoarthritis is markedly advanced for age and unchanged in appearance. IMPRESSION: No acute finding chest or abdomen. Severe bilateral hip osteoarthritis appears unchanged. Electronically Signed   By: Inge Rise M.D.   On: 11/23/2018 11:36    EKG: Independently reviewed. NSR with RBBB, QTc 504. Similar in appearance compared to 08/02/2017   Assessment/Plan Principal Problem:   Intractable nausea and vomiting Active Problems:   Hypokalemia   GERD (gastroesophageal reflux disease)   Type 1 diabetes mellitus (HCC)   HLD (hyperlipidemia)   Marijuana use   Intractable nausea, vomiting -Could be secondary to either  cyclical vomiting syndrome due to marijuana use versus gastroparesis versus viral gastroenteritis -IVF, antiemetics  -Clear liquid diet for now  Hypokalemia -Replace, trend -Check Mg   Type 1 DM with neuropathy -Lantus and SSI q4h  -Neurontin  Substance abuse -UDS positive for opiates, THC   HLD -Lipitor   GERD -Pepcid, PPI on hold for now due to QTC prolongation   DVT prophylaxis: Lovenox Code Status: Full Family Communication: None Disposition Plan: Pending improvement in his nausea, vomiting and electrolytes Consults called: None   Admission status: Observation    Severity of Illness: The appropriate patient status for this patient is OBSERVATION. Observation status is judged to be reasonable and necessary in order to provide the required intensity of service to ensure the patient's safety. The patient's presenting symptoms, physical exam findings, and initial radiographic and laboratory data in the context of their medical condition is felt to place them at decreased risk for further clinical deterioration. Furthermore, it is anticipated that the patient will be medically stable for discharge from the hospital within 2 midnights of admission. The following factors support the patient status of observation.   " The patient's presenting symptoms include intractable nausea, vomiting. " The physical exam findings include dry mucosal membrane. " The initial radiographic and laboratory data are significant for hypokalemia and prolonged QT interval.    Dessa Phi, DO [facility] 11/25/2018, 7:51 AM    How to contact the [facility] Attending or Consulting provider [place] or covering provider during after hours [place], for this patient?  1. Check the care team in [facility] and look for a) attending/consulting [facility] provider listed and b) the [facility] team listed 2. Log into [email or web address] and use [place]'s universal password to access. If you do not have the password, please contact  the hospital operator. 3. Locate the [facility] provider you are looking for under [facility] and page to a number that you can be directly reached. 4. If you still have difficulty reaching the provider, please page the [facility] (Director on Call) for the Hospitalists listed on amion for assistance.

## 2018-11-25 NOTE — Progress Notes (Signed)
Hypoglycemic Event  CBG: 38  Treatment: 8 oz juice/soda  Symptoms: Nervous/irritable & sweaty  Follow-up CBG: Time: 1901 CBG Result: 106  Possible Reasons for Event: Inadequate meal intake  Comments/MD notified:     Michelene Heady

## 2018-11-25 NOTE — Progress Notes (Signed)
Inpatient Diabetes Program Recommendations  AACE/ADA: New Consensus Statement on Inpatient Glycemic Control (2015)  Target Ranges:  Prepandial:   less than 140 mg/dL      Peak postprandial:   less than 180 mg/dL (1-2 hours)      Critically ill patients:  140 - 180 mg/dL   Lab Results  Component Value Date   GLUCAP 333 (H) 11/25/2018   HGBA1C 11.3 (A) 10/19/2018    Review of Glycemic Control Results for Jared Murray, Jared Murray (MRN [id number]) as of 11/25/2018 12:55  Ref. Range 11/23/2018 12:53 11/25/2018 03:40 11/25/2018 07:54 11/25/2018 08:36 11/25/2018 12:08  Glucose-Capillary Latest Ref Range: 70 - 99 mg/dL 211 (H) 119 (H) 42 (LL) 202 (H) 333 (H)   Diabetes history: DM1 Outpatient Diabetes medications: Lantus 15 q am + Novolog 0-12 units (to cover meal coverage + correction) tid Current orders for Inpatient glycemic control: Lantus 15 units + Novolog sensitive q 4 hrs.  Inpatient Diabetes Program Recommendations:   Spoke with patient regarding diabetes management and patient states he takes his insulin as prescribed although he was concerned that the insulin may be expired. Patient  States he found out his insulin was not expired.  Discharge includes lancets to check CBGs ([employer]uc[employer]k brand) Patient normally takes his Lantus insulin in the am.   Thank you, Nani Gasser. Delawrence Fridman, RN, MSN, CDE  Diabetes Coordinator Inpatient Glycemic Control Team Team Pager [phone] (8am-5pm) 11/25/2018 1:34 PM

## 2018-11-26 DIAGNOSIS — R748 Abnormal levels of other serum enzymes: Secondary | ICD-10-CM

## 2018-11-26 DIAGNOSIS — K529 Noninfective gastroenteritis and colitis, unspecified: Secondary | ICD-10-CM

## 2018-11-26 DIAGNOSIS — I4510 Unspecified right bundle-branch block: Secondary | ICD-10-CM | POA: Diagnosis not present

## 2018-11-26 DIAGNOSIS — F411 Generalized anxiety disorder: Secondary | ICD-10-CM | POA: Diagnosis not present

## 2018-11-26 DIAGNOSIS — R9431 Abnormal electrocardiogram [ECG] [EKG]: Secondary | ICD-10-CM

## 2018-11-26 DIAGNOSIS — R112 Nausea with vomiting, unspecified: Secondary | ICD-10-CM | POA: Diagnosis not present

## 2018-11-26 DIAGNOSIS — F17210 Nicotine dependence, cigarettes, uncomplicated: Secondary | ICD-10-CM | POA: Diagnosis not present

## 2018-11-26 DIAGNOSIS — Z79899 Other long term (current) drug therapy: Secondary | ICD-10-CM | POA: Diagnosis not present

## 2018-11-26 DIAGNOSIS — K219 Gastro-esophageal reflux disease without esophagitis: Secondary | ICD-10-CM

## 2018-11-26 DIAGNOSIS — E860 Dehydration: Secondary | ICD-10-CM | POA: Diagnosis not present

## 2018-11-26 DIAGNOSIS — E1029 Type 1 diabetes mellitus with other diabetic kidney complication: Secondary | ICD-10-CM

## 2018-11-26 DIAGNOSIS — F1290 Cannabis use, unspecified, uncomplicated: Secondary | ICD-10-CM

## 2018-11-26 DIAGNOSIS — Z794 Long term (current) use of insulin: Secondary | ICD-10-CM | POA: Diagnosis not present

## 2018-11-26 DIAGNOSIS — E873 Alkalosis: Secondary | ICD-10-CM | POA: Diagnosis not present

## 2018-11-26 DIAGNOSIS — E109 Type 1 diabetes mellitus without complications: Secondary | ICD-10-CM | POA: Diagnosis not present

## 2018-11-26 DIAGNOSIS — E876 Hypokalemia: Secondary | ICD-10-CM

## 2018-11-26 DIAGNOSIS — E785 Hyperlipidemia, unspecified: Secondary | ICD-10-CM

## 2018-11-26 DIAGNOSIS — M1990 Unspecified osteoarthritis, unspecified site: Secondary | ICD-10-CM | POA: Diagnosis not present

## 2018-11-26 LAB — GLUCOSE, CAPILLARY
Glucose-Capillary: 108 mg/dL — ABNORMAL HIGH (ref 70–99)
Glucose-Capillary: 129 mg/dL — ABNORMAL HIGH (ref 70–99)
Glucose-Capillary: 140 mg/dL — ABNORMAL HIGH (ref 70–99)
Glucose-Capillary: 38 mg/dL — CL (ref 70–99)
Glucose-Capillary: 59 mg/dL — ABNORMAL LOW (ref 70–99)
Glucose-Capillary: 72 mg/dL (ref 70–99)
Glucose-Capillary: 95 mg/dL (ref 70–99)

## 2018-11-26 LAB — BASIC METABOLIC PANEL
Anion gap: 12 (ref 5–15)
BUN: <5 mg/dL — ABNORMAL LOW (ref 6–20)
CO2: 26 mmol/L (ref 22–32)
Calcium: 8.2 mg/dL — ABNORMAL LOW (ref 8.9–10.3)
Chloride: 100 mmol/L (ref 98–111)
Creatinine, Ser: 0.68 mg/dL (ref 0.61–1.24)
GFR calc Af Amer: >60 mL/min (ref >60)
GFR calc non Af Amer: >60 mL/min (ref >60)
Glucose, Bld: 135 mg/dL — ABNORMAL HIGH (ref 70–99)
Potassium: 3.2 mmol/L — ABNORMAL LOW (ref 3.5–5.1)
Sodium: 138 mmol/L (ref 135–145)

## 2018-11-26 LAB — CBC
HCT: 38.4 % — ABNORMAL LOW (ref 39.0–52.0)
Hemoglobin: 13.3 g/dL (ref 13.0–17.0)
MCH: 30.1 pg (ref 26.0–34.0)
MCHC: 34.6 g/dL (ref 30.0–36.0)
MCV: 86.9 fL (ref 80.0–100.0)
Platelets: 194 10*3/uL (ref 150–400)
RBC: 4.42 MIL/uL (ref 4.22–5.81)
RDW: 11.9 % (ref 11.5–15.5)
WBC: 5.4 10*3/uL (ref 4.0–10.5)
nRBC: 0 % (ref 0.0–0.2)

## 2018-11-26 LAB — HIV ANTIBODY (ROUTINE TESTING W REFLEX): HIV Screen 4th Generation wRfx: NONREACTIVE

## 2018-11-26 LAB — MAGNESIUM: Magnesium: 1.9 mg/dL (ref 1.7–2.4)

## 2018-11-26 MED ORDER — POTASSIUM CHLORIDE CRYS ER 20 MEQ PO TBCR
30.0000 meq | EXTENDED_RELEASE_TABLET | ORAL | Status: AC
  Administered 2018-11-26: 30 meq via ORAL
  Filled 2018-11-26: qty 1

## 2018-11-26 MED ORDER — DEXTROSE 50 % IV SOLN
12.5000 g | Freq: Once | INTRAVENOUS | Status: AC
  Administered 2018-11-26: 12.5 g via INTRAVENOUS

## 2018-11-26 MED ORDER — DEXTROSE 50 % IV SOLN
INTRAVENOUS | Status: AC
  Administered 2018-11-26: 12.5 g via INTRAVENOUS
  Filled 2018-11-26: qty 50

## 2018-11-26 NOTE — Progress Notes (Signed)
Patient stated has had issues with persistent diarrhea with urge for the last year. Has seen a GI doctor with no diagnosis. Stated has intermittent incontinence at night and requested briefs; briefs not available.

## 2018-11-26 NOTE — Progress Notes (Addendum)
Inpatient Diabetes Program Recommendations  AACE/ADA: New Consensus Statement on Inpatient Glycemic Control (2015)  Target Ranges:  Prepandial:   less than 140 mg/dL      Peak postprandial:   less than 180 mg/dL (1-2 hours)      Critically ill patients:  140 - 180 mg/dL   Lab Results  Component Value Date   GLUCAP 140 (H) 11/26/2018   HGBA1C 11.3 (A) 10/19/2018     Results for Jared Murray, Jared Murray (MRN [id number]) as of 11/26/2018 16:55  Ref. Range 11/25/2018 03:40 11/25/2018 07:54 11/25/2018 08:36 11/25/2018 12:08 11/25/2018 16:01 11/25/2018 18:35 11/25/2018 19:01 11/25/2018 20:10 11/26/2018 00:13 11/26/2018 00:53 11/26/2018 01:16 11/26/2018 04:19 11/26/2018 05:26 11/26/2018 08:01 11/26/2018 11:54  Glucose-Capillary Latest Ref Range: 70 - 99 mg/dL 119 (H) 42 (LL) 202 (H) 333 (H) 177 (H) 38 (LL) 106 (H) 97 72 59 (L) 95 38 (LL) 129 (H) 108 (H) 140 (H)      Review of Glycemic Control  Diabetes history: DM Type 1  Outpatient Diabetes medications: Lantus 15 units Q AM                                                        Novolog (0-12 units) tid ssi    Patient has been discharged home but discussed with RN prior to patient's d/c his extreme lows of 38mg /dl at 1835 and 38 mg/dl at 0419 this am. She shared with me that patient had discussed with her that he is extremely sensitive to insulin.  Also per patient, he sees an [facility]o at [facility] and had been instructed not to cover himself with correction unless cbg over 200mg /dl. If patient is readmitted at a future time would recommend a Novolog custom scale (as Novolog sensitive scale may be more insulin than required for him) in addition to his Lantus 15 units Qam.    Jonna Clark RN, MSN Diabetes Coordinator Inpatient Glycemic Control Team Team Pager: [phone] (8am-5pm)

## 2018-11-26 NOTE — Discharge Summary (Signed)
Jared Murray, is a 43 y.o. male  DOB 1975-12-22  MRN [id number].  Admission date:  11/25/2018  Admitting Physician  Dessa Phi, DO  Discharge Date:  11/26/2018   Primary MD  Charlott Rakes, MD  Recommendations for primary care physician for things to follow:   -Elevated liver enzymes -Need of supplementation with potassium     Discharge Diagnosis   Principal Problem:   Intractable nausea and vomiting Active Problems:   Hypokalemia   GERD (gastroesophageal reflux disease)   Type 1 diabetes mellitus (HCC)   Chronic diarrhea   HLD (hyperlipidemia)   Marijuana use   Prolonged QT interval      Past Medical History:  Diagnosis Date   Arthritis    DKA (diabetic ketoacidoses) ([place])    Eczema    GERD (gastroesophageal reflux disease)    Psoriasis    Smoker    Substance abuse ([place])    Type 1 diabetes mellitus ([place])     Past Surgical History:  Procedure Laterality Date   FINGER SURGERY Right    middle finger       HPI  from the history and physical done on the day of admission:   Jared Murray is a 43 y.o. male with medical history significant of type 1 DM, substance abuse, who presents with intractable nausea and vomiting.  He states that this has been going on for a few days.  He was evaluated in the emergency department on 4/7 with same.  At that time labs were notable for acute kidney injury, hypokalemia.  He was discharged home after receiving IV fluids.  However he now returns due to lack of improvement.  He denies any fevers, chest pain, shortness of breath, abdominal pain, diarrhea, dysuria.  He has not been able to keep down much fluid or food.  He does use marijuana on occasion to increase his appetite.  He is currently under care of pain clinic. No known sick contacts; wife and kids  feeling well at home.   ED Course: Labs reveal metabolic alkalosis, hypokalemia.  Patient not in DKA.  UDS positive for opiates and THC.  He was given IV and oral potassium supplements.  EKG revealed prolonged QT interval.  Patient referred to observation for intractable nausea, vomiting as well as electrolyte replacements.   Hospital Course:   1. Intractable nausea and vomiting: Resolved.  Urine drug screen positive for marijuana and opioids.  Patient was given symptomatic treatments overnight with improvement of symptoms.  Diet advanced without issue.  Symptoms could be related with gastroparesis versus cyclical vomiting syndrome due to marijuana versus drug withdrawal vs gastroenteritis/gastritis.  2.  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  Patient was given food and dextrose with resolution of symptoms.  Last hemoglobin A1c noted to be 11.4 on 07/19/2018.  3.  Hypokalemia: Resolving.  Admission potassium noted to be as  low as 2.9 with magnesium. Patient given 80 mEq of potassium chloride p.o. with repeat check prior to discharge 3.2.  Patient was given an additional 30 mEq of potassium chloride orally.  Likely related with multiple episodes of vomiting along with history of diarrhea.  Recommending repeat BMP within the next week.  May warrant daily supplementation.  4.  Acute kidney injury: Resolved. Patient found to have elevated creatinine 1.22 with BUN 11.  With IV fluids and resolution of nausea and vomiting symptoms creatinine 0.68 prior to discharge.  5.  Chronic diarrhea: Recommend patient to continue outpatient follow-up with [facility] gastroenterology.  6.  Chronic pain, history of avascular necrosis of the hip: Takes Tylenol #3 with history of.  7.  Marijuana abuse: Urine drug screen positive for marijuana.  Patient reports drinking  marijuana to help stimulate appetite.  8.  Prolonged QT interval: Resolved.  On admission QTc 506.  QT prolonging medications were initially held.  QT within normal limits prior to discharge with correction of electrolyte abnormalities.  9.  Hyperlipidemia: Patient was continued on Lipitor.    10.  GERD: Initially PPI was held due to QT prolongation.  At discharge patient was recommended to restart his home PPI.  11.  Elevated liver enzymes, hyperbilirubinemia: Acute on chronic.  Patient noted to have elevated alkaline phosphatase, AST, and total bilirubin on admission.  Review of record shows intermittently elevated levels.  AST to ALT ratio suggests alcohol use.  Follow UP  Follow-up Information    Charlott Rakes, MD Follow up.   Specialty:  Family Medicine Why:  call for foll Contact information: [address] [place] [address] [phone]            Consults obtained: Diabetic education  Discharge Condition:  Fair  Diet and Activity recommendation: See Discharge Instructions below   Discharge Instructions    Discharge instructions   Complete by:  As directed    Follow with Primary MD Charlott Rakes, MD within 1 week.  Please check blood sugars 3 times daily and prior to bedtime to ensure that you are not having low blood sugars at home.  Your symptoms were thought possibly related with gastroparesis, but regular marijuana use can also cause can cause similar symptoms.  Please continue outpatient work-up with gastroenterology in regards to diarrhea symptoms.  Get CMP-  checked  by Primary MD in 5-7 days ( we routinely change or add medications that can affect your baseline labs and fluid status, therefore we recommend that you get the mentioned basic workup next visit with your PCP, your PCP may decide not to get them or add new tests based on their clinical decision)  Activity: As tolerated   Disposition: Home   Diet: Carb Modified       Special  Instructions: If you have smoked or chewed Tobacco  in the last 2 yrs please stop smoking, stop any regular Alcohol  and or any Recreational drug use.  On your next visit with your primary care physician please Get Medicines reviewed and adjusted.  Please request your Charlott Rakes, MD to go over all Hospital Tests and Procedure/Radiological results at the follow up, please get all Hospital records sent to your Prim MD by signing hospital release before you go home.  If you experience worsening of your admission symptoms, develop shortness of breath, life threatening emergency, suicidal or homicidal thoughts you must seek medical attention immediately by calling 911 or calling your MD immediately  if symptoms less  severe.  You Must read complete instructions/literature along with all the possible adverse reactions/side effects for all the Medicines you take and that have been prescribed to you. Take any new Medicines after you have completely understood and accpet all the possible adverse reactions/side effects.   Do not drive, operate heavy machinery, perform activities at heights, swimming or participation in water activities or provide baby sitting services if your were admitted for syncope or siezures until you have seen by Primary MD or a Neurologist and advised to do so again.  Do not drive when taking Pain medications.  Do not take more than prescribed Pain, Sleep and Anxiety Medications  Wear Seat belts while driving.   Please note  You were cared for by a hospitalist during your hospital stay. If you have any questions about your discharge medications or the care you received while you were in the hospital after you are discharged, you can call the unit and asked to speak with the hospitalist on call if the hospitalist that took care of you is not available. Once you are discharged, your primary care physician will handle any further medical issues. Please note that NO REFILLS for any  discharge medications will be authorized once you are discharged, as it is imperative that you return to your primary care physician (or establish a relationship with a primary care physician if you do not have one) for your aftercare needs so that they can reassess your need for medications and monitor your lab values.        Discharge Medications     Allergies as of 11/26/2018      Reactions   Bee Venom    Latex Itching, Rash   Penicillins Other (See Comments)   Childhood allergy Has patient had a PCN reaction causing immediate rash, facial/tongue/throat swelling, SOB or lightheadedness with hypotension: NO Has patient had a PCN reaction causing severe rash involving mucus membranes or skin necrosis:NO Has patient had a PCN reaction that required hospitalization NO Has patient had a PCN reaction occurring within the last 10 years: NO If all of the above answers are "NO", then may proceed with Cephalosporin use.      Medication List    STOP taking these medications   famotidine 20 MG tablet Commonly known as:  Pepcid     TAKE these medications   Accu-Chek Aviva Plus w/Device Kit TEST 3 TIMES DAILY   Accu-Chek Softclix Lancets lancets Use as instructed 3 times daily. Dx: Type 1 DM   acetaminophen-codeine 300-30 MG tablet Commonly known as:  TYLENOL #3 Take 1 tablet by mouth every 12 (twelve) hours as needed for moderate pain. Dx:P avascular necrosis of the hip   aspirin-sod bicarb-citric acid 325 MG Tbef tablet Commonly known as:  ALKA-SELTZER Take 325 mg by mouth every 6 (six) hours as needed (heartburn).   atorvastatin 20 MG tablet Commonly known as:  LIPITOR Take 1 tablet (20 mg total) by mouth daily.   clobetasol cream 0.05 % Commonly known as:  TEMOVATE APPLY EXTERNALLY TO THE AFFECTED AREA TWICE DAILY What changed:    how much to take  how to take this  when to take this  reasons to take this   gabapentin 300 MG capsule Commonly known as:   NEURONTIN Take 1 capsule (300 mg total) by mouth 2 (two) times daily.   glucose blood test strip Commonly known as:  Accu-Chek Aviva Plus USE TO CHECK BLOOD SUGAR FOUR TIMES DAILY BEFORE MEALS AND AT  BEDTIME   insulin aspart 100 UNIT/ML injection Commonly known as:  NovoLOG INJECT 0-12 UNITS INTO THE SKIN THREE TIMES DAILY WITH MEALS AS PER SLIDING SCALE   insulin glargine 100 UNIT/ML injection Commonly known as:  Lantus Inject 0.15 mLs (15 Units total) into the skin at bedtime.   Insulin Syringe-Needle U-100 29G X 1/2" 2 ML Misc Commonly known as:  B-D INS SYRINGE 2CC/29GX1/2" Check Blood sugar TID and QHS   ondansetron 8 MG disintegrating tablet Commonly known as:  Zofran ODT Take 1 tablet (8 mg total) by mouth every 8 (eight) hours as needed for nausea or vomiting.   pantoprazole 40 MG tablet Commonly known as:  PROTONIX Take 1 tablet (40 mg total) by mouth daily.       Major procedures and Radiology Reports - PLEASE review detailed and final reports for all details, in brief -      Dg Abd Acute W/chest  Result Date: 11/23/2018 CLINICAL DATA:  Vomiting for 2 days. EXAM: DG ABDOMEN ACUTE W/ 1V CHEST COMPARISON:  CT abdomen and pelvis 08/01/2017. Single-view of the chest 03/31/2016. FINDINGS: Single-view of the chest demonstrates clear lungs and normal heart size. No pneumothorax or pleural effusion. Two views of the abdomen show no free intraperitoneal air. The bowel gas pattern is normal. Severe bilateral hip osteoarthritis is markedly advanced for age and unchanged in appearance. IMPRESSION: No acute finding chest or abdomen. Severe bilateral hip osteoarthritis appears unchanged. Electronically Signed   By: Inge Rise M.D.   On: 11/23/2018 11:36    Micro Results    No results found for this or any previous visit (from the past 240 hour(s)).     Today   Subjective   Jared Murray today has had no recurrence of vomiting since yesterday.  Reports history of  chronic diarrhea for which he is being followed by [facility] gastroenterology.  They have not found a clear cause of his symptoms as of yet.  States that his last hemoglobin A1c was somewhere around 10.7 possibly.  Previously being seen by [facility] specialist for management, but has reestablished insurance and needs to reestablish care.  Nursing report overnight patient with several episodes of hypoglycemia as low as 38 requiring patient to be given amps of dextrose  Objective   Blood pressure 115/84, pulse 79, temperature 98.1 F (36.7 C), temperature source Oral, resp. rate 13, height '5\' 10"'  (1.778 m), weight 56.1 kg, SpO2 100 %.   Intake/Output Summary (Last 24 hours) at 11/26/2018 1340 Last data filed at 11/26/2018 0900 Gross per 24 hour  Intake 1040 ml  Output --  Net 1040 ml    Exam  Constitutional: Thin middle-aged male in NAD, calm, comfortable Eyes: PERRL, lids and conjunctivae normal ENMT: Mucous membranes are moist. Posterior pharynx clear of any exudate or lesions. .  Neck: normal, supple, no masses, no thyromegaly Respiratory: clear to auscultation bilaterally, no wheezing, no crackles. Normal respiratory effort. No accessory muscle use.  Cardiovascular: Regular rate and rhythm, no murmurs / rubs / gallops. No extremity edema. 2+ pedal pulses. No carotid bruits.  Abdomen: no tenderness, no masses palpated. No hepatosplenomegaly. Bowel sounds positive.  Musculoskeletal: no clubbing / cyanosis. No joint deformity upper and lower extremities. Good ROM, no contractures. Normal muscle tone.  Skin: Vitiligo present of the lower extremities.  No open wounds appreciated. Neurologic: CN 2-12 grossly intact. Sensation grossly intact. Strength 5/5 in all 4.  Psychiatric: Fair judgment and insight. Alert and oriented x 3. Normal mood.  Data Review   CBC w Diff:  Lab Results  Component Value Date   WBC 5.4 11/26/2018   HGB 13.3 11/26/2018   HCT 38.4 (L) 11/26/2018   PLT 194  11/26/2018   LYMPHOPCT 34 11/25/2018   MONOPCT 9 11/25/2018   EOSPCT 1 11/25/2018   BASOPCT 1 11/25/2018    CMP:  Lab Results  Component Value Date   NA 138 11/26/2018   NA 139 10/19/2018   K 3.2 (L) 11/26/2018   CL 100 11/26/2018   CO2 26 11/26/2018   BUN <5 (L) 11/26/2018   BUN 7 10/19/2018   CREATININE 0.68 11/26/2018   CREATININE 0.92 09/18/2016   PROT 6.7 11/25/2018   PROT 7.0 10/19/2018   ALBUMIN 3.9 11/25/2018   ALBUMIN 4.7 10/19/2018   BILITOT 1.8 (H) 11/25/2018   BILITOT 1.9 (H) 10/19/2018   ALKPHOS 137 (H) 11/25/2018   AST 72 (H) 11/25/2018   ALT 37 11/25/2018  .   Total Time in preparing paper work, data evaluation and todays exam - 35 minutes  Norval Morton M.D on 11/26/2018 at 1:40 PM  [facility]   Office  [phone]

## 2018-11-26 NOTE — Progress Notes (Signed)
NT reported patient CBG 72 at 0013; provided patient with 4 oz juice. NT report CBG 59 at 0053; provided second 4 oz cup of juice. CBG 95 at 0116.   0419 CBG 38; 4 oz juice given per hypogly algorithim. Patient assessed; A&Ox4, pt. stated felt somewhat lightheaded, however, no other symptoms. Paged [place] [phone] to inquire ok to give 1/2 amp D50 instead of more juice given recurrent hypoglycemic episodes. 12.5 g (25 mL) given per MD order at 0445. CBG 129 at 0526.

## 2018-11-27 DIAGNOSIS — R748 Abnormal levels of other serum enzymes: Secondary | ICD-10-CM | POA: Diagnosis present

## 2018-11-27 DIAGNOSIS — R9431 Abnormal electrocardiogram [ECG] [EKG]: Secondary | ICD-10-CM

## 2018-11-27 HISTORY — DX: Abnormal electrocardiogram (ECG) (EKG): R94.31

## 2018-11-27 HISTORY — DX: Abnormal levels of other serum enzymes: R74.8

## 2018-12-08 ENCOUNTER — Telehealth: Payer: Self-pay | Admitting: Family Medicine

## 2018-12-08 DIAGNOSIS — E1065 Type 1 diabetes mellitus with hyperglycemia: Secondary | ICD-10-CM

## 2018-12-08 MED ORDER — INSULIN GLARGINE 100 UNIT/ML ~~LOC~~ SOLN: 15.0000 [IU] | Freq: Every day | SUBCUTANEOUS | 1 refills | Status: DC

## 2018-12-08 NOTE — Telephone Encounter (Signed)
1) Medication(s) Requested (by name): insulin glargine (LANTUS) 100 UNIT/ML injection  2) Pharmacy of Choice: [facility] [address] 3) Special Requests: Stated our pharmacy didn't have it    Approved medications will be sent to the pharmacy, we will reach out if there is an issue.  Requests made after 3pm may not be addressed until the following business day!  If a patient is unsure of the name of the medication(s) please note and ask patient to call back when they are able to provide all info, do not send to responsible party until all information is available!

## 2018-12-16 ENCOUNTER — Encounter: Payer: Self-pay | Admitting: Primary Care

## 2018-12-16 ENCOUNTER — Other Ambulatory Visit: Payer: Self-pay

## 2018-12-16 ENCOUNTER — Ambulatory Visit: Payer: Medicare Other | Attending: Primary Care | Admitting: Primary Care

## 2018-12-16 ENCOUNTER — Other Ambulatory Visit: Payer: Self-pay | Admitting: Pharmacist

## 2018-12-16 VITALS — BP 92/62 | HR 101 | Temp 98.8°F | Resp 18 | Ht 69.0 in | Wt 123.0 lb

## 2018-12-16 DIAGNOSIS — E109 Type 1 diabetes mellitus without complications: Secondary | ICD-10-CM | POA: Insufficient documentation

## 2018-12-16 DIAGNOSIS — Z09 Encounter for follow-up examination after completed treatment for conditions other than malignant neoplasm: Secondary | ICD-10-CM | POA: Diagnosis not present

## 2018-12-16 DIAGNOSIS — Z880 Allergy status to penicillin: Secondary | ICD-10-CM | POA: Diagnosis not present

## 2018-12-16 DIAGNOSIS — E108 Type 1 diabetes mellitus with unspecified complications: Secondary | ICD-10-CM | POA: Diagnosis not present

## 2018-12-16 DIAGNOSIS — Z91030 Bee allergy status: Secondary | ICD-10-CM | POA: Diagnosis not present

## 2018-12-16 DIAGNOSIS — Z833 Family history of diabetes mellitus: Secondary | ICD-10-CM | POA: Diagnosis not present

## 2018-12-16 DIAGNOSIS — K219 Gastro-esophageal reflux disease without esophagitis: Secondary | ICD-10-CM | POA: Insufficient documentation

## 2018-12-16 DIAGNOSIS — N179 Acute kidney failure, unspecified: Secondary | ICD-10-CM | POA: Insufficient documentation

## 2018-12-16 DIAGNOSIS — R9431 Abnormal electrocardiogram [ECG] [EKG]: Secondary | ICD-10-CM

## 2018-12-16 DIAGNOSIS — E1010 Type 1 diabetes mellitus with ketoacidosis without coma: Secondary | ICD-10-CM | POA: Insufficient documentation

## 2018-12-16 DIAGNOSIS — Z794 Long term (current) use of insulin: Secondary | ICD-10-CM | POA: Insufficient documentation

## 2018-12-16 DIAGNOSIS — Z79899 Other long term (current) drug therapy: Secondary | ICD-10-CM | POA: Insufficient documentation

## 2018-12-16 DIAGNOSIS — E876 Hypokalemia: Secondary | ICD-10-CM | POA: Insufficient documentation

## 2018-12-16 DIAGNOSIS — F1911 Other psychoactive substance abuse, in remission: Secondary | ICD-10-CM | POA: Diagnosis not present

## 2018-12-16 DIAGNOSIS — Z87891 Personal history of nicotine dependence: Secondary | ICD-10-CM | POA: Diagnosis not present

## 2018-12-16 DIAGNOSIS — E1065 Type 1 diabetes mellitus with hyperglycemia: Secondary | ICD-10-CM

## 2018-12-16 DIAGNOSIS — Z91040 Latex allergy status: Secondary | ICD-10-CM | POA: Insufficient documentation

## 2018-12-16 DIAGNOSIS — M1990 Unspecified osteoarthritis, unspecified site: Secondary | ICD-10-CM | POA: Diagnosis not present

## 2018-12-16 MED ORDER — ATORVASTATIN CALCIUM 20 MG PO TABS: 20.0000 mg | ORAL_TABLET | Freq: Every day | ORAL | 0 refills | Status: DC

## 2018-12-16 MED ORDER — ACCU-CHEK SOFTCLIX LANCETS MISC: 5 refills | Status: DC

## 2018-12-16 MED ORDER — BASAGLAR KWIKPEN 100 UNIT/ML ~~LOC~~ SOPN: 10.0000 [IU] | PEN_INJECTOR | Freq: Every day | SUBCUTANEOUS | 0 refills | Status: DC

## 2018-12-16 MED FILL — ATORVASTATIN 20 MG TABLET: 20 | 90 days supply | Qty: 90 | Fill #0

## 2018-12-16 NOTE — Progress Notes (Signed)
Established Patient Office Visit  Subjective:  Patient ID: Jared Murray, male    DOB: [birth year]  Age: 43 y.o. MRN: [id number]  CC:  Chief Complaint  Patient presents with  . Hospitalization Follow-up    HPI Jared Murray presents for a hospital follow up. Past Medical History with medical history significant of type 1 DM and substance abuse. Admitted for intractable nausea and vomiting. ED labs were notable for acute kidney injury, hypokalemia.  He was discharged home after receiving IV fluids.    ED Course: Labs reveal metabolic alkalosis, hypokalemia.  Patient not in DKA.  UDS positive for opiates and THC.  He was given IV and oral potassium supplements.  EKG revealed prolonged QT interval.  Patient referred to observation for intractable nausea, vomiting as well as electrolyte replacements.  Review of Systems: As per HPI otherwise 10 point review of systems negative.       Past Medical History:  Diagnosis Date  . Arthritis   . DKA (diabetic ketoacidoses) ([place])   . Eczema   . GERD (gastroesophageal reflux disease)   . Psoriasis   . Smoker   . Substance abuse ([place])   . Type 1 diabetes mellitus ([place])          Past Surgical History:  Procedure Laterality Date  . FINGER SURGERY Right    middle finger     reports that he has been smoking cigarettes. He has a 2.50 pack-year smoking history. He has never used smokeless tobacco. He reports current alcohol use of about 1.0 - 2.0 standard drinks of alcohol per week. He reports current drug use. Drug: Marijuana.    Past Medical History:  Diagnosis Date  . Arthritis   . DKA (diabetic ketoacidoses) ([place])   . Eczema   . GERD (gastroesophageal reflux disease)   . Psoriasis   . Smoker   . Substance abuse ([place])   . Type 1 diabetes mellitus ([place])     Past Surgical History:  Procedure Laterality Date  . FINGER SURGERY Right    middle finger    Family History  Problem Relation Age of Onset  .  Hypertension Father   . Diabetes Other        multiple  . Lupus Cousin   . Stroke Maternal Grandmother   . Stroke Paternal Grandmother   . Colon cancer Neg Hx   . Stomach cancer Neg Hx   . Rectal cancer Neg Hx   . Esophageal cancer Neg Hx     Social History   Socioeconomic History  . Marital status: Married    Spouse name: Not on file  . Number of children: Not on file  . Years of education: Not on file  . Highest education level: Not on file  Occupational History  . Not on file  Social Needs  . Financial resource strain: Somewhat hard  . Food insecurity:    Worry: Sometimes true    Inability: Sometimes true  . Transportation needs:    Medical: No    Non-medical: No  Tobacco Use  . Smoking status: Former Smoker    Packs/day: 0.25    Years: 10.00    Pack years: 2.50    Types: Cigarettes    Last attempt to quit: 11/26/2018    Years since quitting: 0.0  . Smokeless tobacco: Never Used  Substance and Sexual Activity  . Alcohol use: Yes    Alcohol/week: 1.0 - 2.0 standard drinks    Types: 1 - 2  Shots of liquor per week    Comment: last time tuesday  . Drug use: Yes    Types: Marijuana    Comment: smoked about 2 weeks ago  . Sexual activity: Yes  Lifestyle  . Physical activity:    Days per week: 0 days    Minutes per session: 0 min  . Stress: Very much  Relationships  . Social connections:    Talks on phone: Patient refused    Gets together: Patient refused    Attends religious service: Patient refused    Active member of club or organization: Patient refused    Attends meetings of clubs or organizations: Patient refused    Relationship status: Patient refused  . Intimate partner violence:    Fear of current or ex partner: Patient refused    Emotionally abused: Patient refused    Physically abused: Patient refused    Forced sexual activity: Patient refused  Other Topics Concern  . Not on file  Social History Narrative   Lives in [place] - works as a  Education officer, museum   Married    Outpatient Medications Prior to Visit  Medication Sig Dispense Refill  . acetaminophen-codeine (TYLENOL #3) 300-30 MG tablet Take 1 tablet by mouth every 12 (twelve) hours as needed for moderate pain. Dx:P avascular necrosis of the hip 60 tablet 0  . aspirin-sod bicarb-citric acid (ALKA-SELTZER) 325 MG TBEF tablet Take 325 mg by mouth every 6 (six) hours as needed (heartburn).    Marland Kitchen atorvastatin (LIPITOR) 20 MG tablet Take 1 tablet (20 mg total) by mouth daily. 90 tablet 0  . Blood Glucose Monitoring Suppl (ACCU-CHEK AVIVA PLUS) w/Device KIT TEST 3 TIMES DAILY 1 kit 0  . clobetasol cream (TEMOVATE) 0.05 % APPLY EXTERNALLY TO THE AFFECTED AREA TWICE DAILY (Patient taking differently: Apply 1 application topically 2 (two) times daily as needed (skin irritation). APPLY EXTERNALLY TO THE AFFECTED AREA TWICE DAILY) 30 g 0  . gabapentin (NEURONTIN) 300 MG capsule Take 1 capsule (300 mg total) by mouth 2 (two) times daily. 60 capsule 3  . glucose blood (ACCU-CHEK AVIVA PLUS) test strip USE TO CHECK BLOOD SUGAR FOUR TIMES DAILY BEFORE MEALS AND AT BEDTIME 100 each 11  . insulin aspart (NOVOLOG) 100 UNIT/ML injection INJECT 0-12 UNITS INTO THE SKIN THREE TIMES DAILY WITH MEALS AS PER SLIDING SCALE 10 mL 2  . Insulin Syringe-Needle U-100 (B-D INS SYRINGE 2CC/29GX1/2") 29G X 1/2" 2 ML MISC Check Blood sugar TID and QHS 100 each 12  . ondansetron (ZOFRAN ODT) 8 MG disintegrating tablet Take 1 tablet (8 mg total) by mouth every 8 (eight) hours as needed for nausea or vomiting. 12 tablet 0  . pantoprazole (PROTONIX) 40 MG tablet Take 1 tablet (40 mg total) by mouth daily. 30 tablet 5  . ACCU-CHEK SOFTCLIX LANCETS lancets Use as instructed 3 times daily. Dx: Type 1 DM 100 each 5  . insulin glargine (LANTUS) 100 UNIT/ML injection Inject 0.15 mLs (15 Units total) into the skin at bedtime. 15 mL 1   No facility-administered medications prior to visit.     Allergies  Allergen Reactions   . Bee Venom   . Latex Itching and Rash  . Penicillins Other (See Comments)    Childhood allergy Has patient had a PCN reaction causing immediate rash, facial/tongue/throat swelling, SOB or lightheadedness with hypotension: NO Has patient had a PCN reaction causing severe rash involving mucus membranes or skin necrosis:NO Has patient had a PCN reaction that required hospitalization  NO Has patient had a PCN reaction occurring within the last 10 years: NO If all of the above answers are "NO", then may proceed with Cephalosporin use.    ROS Review of Systems  Constitutional: Negative.   HENT: Negative.   Eyes: Negative.   Respiratory: Negative.   Gastrointestinal: Positive for abdominal distention.  Endocrine: Negative.   Genitourinary: Negative.   Musculoskeletal: Negative.   Skin: Negative.   Allergic/Immunologic: Negative.   Neurological: Negative.   Hematological: Negative.   Psychiatric/Behavioral: Positive for sleep disturbance. The patient is nervous/anxious.       Objective:    Physical Exam  Constitutional: He is oriented to person, place, and time. He appears well-developed and well-nourished.  HENT:  Head: Normocephalic and atraumatic.  Eyes: EOM are normal.  Neck: Normal range of motion. Neck supple.  Cardiovascular: Normal rate and regular rhythm.  Pulmonary/Chest: Effort normal and breath sounds normal.  Abdominal: Soft. Bowel sounds are normal. He exhibits distension.  Musculoskeletal: Normal range of motion.  Neurological: He is alert and oriented to person, place, and time.  Skin: Skin is warm and dry.  Psychiatric: He has a normal mood and affect.    BP 92/62 (BP Location: Left Arm, Patient Position: Sitting, Cuff Size: Normal)   Pulse (!) 101   Temp 98.8 F (37.1 C) (Oral)   Resp 18   Ht 5' 9" (1.753 m)   Wt 123 lb (55.8 kg)   SpO2 (!) 9%   BMI 18.16 kg/m  Wt Readings from Last 3 Encounters:  12/16/18 123 lb (55.8 kg)  11/25/18 123 lb 10.9 oz  (56.1 kg)  11/23/18 132 lb 4.4 oz (60 kg)     Health Maintenance Due  Topic Date Due  . OPHTHALMOLOGY EXAM  05/15/1986  . TETANUS/TDAP  05/16/1995  . URINE MICROALBUMIN  12/03/2018    There are no preventive care reminders to display for this patient.  Lab Results  Component Value Date   TSH 0.700 02/22/2015   Lab Results  Component Value Date   WBC 5.4 11/26/2018   HGB 13.3 11/26/2018   HCT 38.4 (L) 11/26/2018   MCV 86.9 11/26/2018   PLT 194 11/26/2018   Lab Results  Component Value Date   NA 138 11/26/2018   K 3.2 (L) 11/26/2018   CO2 26 11/26/2018   GLUCOSE 135 (H) 11/26/2018   BUN <5 (L) 11/26/2018   CREATININE 0.68 11/26/2018   BILITOT 1.8 (H) 11/25/2018   ALKPHOS 137 (H) 11/25/2018   AST 72 (H) 11/25/2018   ALT 37 11/25/2018   PROT 6.7 11/25/2018   ALBUMIN 3.9 11/25/2018   CALCIUM 8.2 (L) 11/26/2018   ANIONGAP 12 11/26/2018   Lab Results  Component Value Date   CHOL 109 10/19/2018   Lab Results  Component Value Date   HDL 69 10/19/2018   Lab Results  Component Value Date   LDLCALC 32 10/19/2018   Lab Results  Component Value Date   TRIG 39 10/19/2018   Lab Results  Component Value Date   CHOLHDL 1.6 10/19/2018   Lab Results  Component Value Date   HGBA1C 11.3 (A) 10/19/2018      Assessment & Plan:   Problem List Items Addressed This Visit    None    Visit Diagnoses    Type 1 diabetes mellitus with complication ([place])    -  Primary   Relevant Medications   Insulin Glargine (BASAGLAR KWIKPEN) 100 UNIT/ML SOPN   Other Relevant Orders  Comprehensive metabolic panel   CBC with Differential      Meds ordered this encounter  Medications  . Accu-Chek Softclix Lancets lancets    Sig: Use as instructed 3 times daily. Dx: Type 1 DM    Dispense:  100 each    Refill:  5  . Insulin Glargine (BASAGLAR KWIKPEN) 100 UNIT/ML SOPN    Sig: Inject 0.1 mLs (10 Units total) into the skin daily for 30 days.    Dispense:  3 mL    Refill:  0    Jared Murray was seen today for hospitalization follow-up.  Diagnoses and all orders for this visit:  Type 1 diabetes mellitus with complication (HCC) Insulin change to Basaglar 10 units due to insurance not paying for lantus. F/u will PCP will adjusts units at evaluation.  Discussed diabetic gastroparesis to include 6 small frequent meals to allow proper digestion  -     Comprehensive metabolic panel -     CBC with Differential  Hospital discharge follow-up For intractable n/v leading to dehydration  Refer to cardiology for follow up incidental finding prolong QT waves  Gastroesophageal reflux disease without esophagitis  Hypokalemia Labs taken today after review evaluate need for supplement  AKI (acute kidney injury) ([facility]) Secondary to dehyration will ck kidney function with labs   Other orders -     Accu-Chek Softclix Lancets lancets; Use as instructed 3 times daily. Dx: Type 1 DM -     Insulin Glargine (BASAGLAR KWIKPEN) 100 UNIT/ML SOPN; Inject 0.1 mLs (10 Units total) into the skin daily for 30 days.   Follow-up: No follow-ups on file.    Kerin Perna, NP

## 2018-12-17 ENCOUNTER — Encounter: Payer: Self-pay | Admitting: Primary Care

## 2018-12-17 LAB — CBC WITH DIFFERENTIAL/PLATELET
Basophils Absolute: 0 10*3/uL (ref 0.0–0.2)
Basos: 1 %
EOS (ABSOLUTE): 0.2 10*3/uL (ref 0.0–0.4)
Eos: 4 %
Hematocrit: 36.8 % — ABNORMAL LOW (ref 37.5–51.0)
Hemoglobin: 12.4 g/dL — ABNORMAL LOW (ref 13.0–17.7)
Immature Grans (Abs): 0 10*3/uL (ref 0.0–0.1)
Immature Granulocytes: 0 %
Lymphocytes Absolute: 2.1 10*3/uL (ref 0.7–3.1)
Lymphs: 46 %
MCH: 30 pg (ref 26.6–33.0)
MCHC: 33.7 g/dL (ref 31.5–35.7)
MCV: 89 fL (ref 79–97)
Monocytes Absolute: 0.3 10*3/uL (ref 0.1–0.9)
Monocytes: 7 %
Neutrophils Absolute: 1.9 10*3/uL (ref 1.4–7.0)
Neutrophils: 42 %
Platelets: 299 10*3/uL (ref 150–450)
RBC: 4.14 x10E6/uL (ref 4.14–5.80)
RDW: 13 % (ref 11.6–15.4)
WBC: 4.5 10*3/uL (ref 3.4–10.8)

## 2018-12-17 LAB — COMPREHENSIVE METABOLIC PANEL
ALT: 77 IU/L — ABNORMAL HIGH (ref 0–44)
AST: 38 IU/L (ref 0–40)
Albumin/Globulin Ratio: 2 (ref 1.2–2.2)
Albumin: 4.2 g/dL (ref 4.0–5.0)
Alkaline Phosphatase: 153 IU/L — ABNORMAL HIGH (ref 39–117)
BUN/Creatinine Ratio: 10 (ref 9–20)
BUN: 9 mg/dL (ref 6–24)
Bilirubin Total: 0.7 mg/dL (ref 0.0–1.2)
CO2: 19 mmol/L — ABNORMAL LOW (ref 20–29)
Calcium: 9.5 mg/dL (ref 8.7–10.2)
Chloride: 103 mmol/L (ref 96–106)
Creatinine, Ser: 0.91 mg/dL (ref 0.76–1.27)
GFR calc Af Amer: 120 mL/min/{1.73_m2} (ref >59)
GFR calc non Af Amer: 104 mL/min/{1.73_m2} (ref >59)
Globulin, Total: 2.1 g/dL (ref 1.5–4.5)
Glucose: 161 mg/dL — ABNORMAL HIGH (ref 65–99)
Potassium: 4.2 mmol/L (ref 3.5–5.2)
Sodium: 136 mmol/L (ref 134–144)
Total Protein: 6.3 g/dL (ref 6.0–8.5)

## 2018-12-22 ENCOUNTER — Telehealth: Payer: Self-pay | Admitting: Cardiology

## 2018-12-22 ENCOUNTER — Telehealth: Payer: Self-pay | Admitting: *Deleted

## 2018-12-22 NOTE — Telephone Encounter (Signed)
Patient verified DOB Patient is aware of kidney function returning to normal. No further questions at this time.

## 2018-12-22 NOTE — Telephone Encounter (Signed)
-----   Message from Kerin Perna, NP sent at 12/17/2018 12:12 PM EDT ----- Kidney function returned to wnl

## 2018-12-22 NOTE — Telephone Encounter (Signed)
Virtual Visit Pre-Appointment Phone Call  "(Name), I am calling you today to discuss your upcoming appointment. We are currently trying to limit exposure to the virus that causes COVID-19 by seeing patients at home rather than in the office."  1. "What is the BEST phone number to call the day of the visit?" - include this in appointment notes  2. Do you have or have access to (through a family member/friend) a smartphone with video capability that we can use for your visit?" a. If yes - list this number in appt notes as cell (if different from BEST phone #) and list the appointment type as a VIDEO visit in appointment notes b. If no - list the appointment type as a PHONE visit in appointment notes  Confirm consent - "In the setting of the current Covid19 crisis, you are scheduled for a (phone or video) visit with your provider on (date) at (time).  Just as we do with many in-office visits, in order for you to participate in this visit, we must obtain consent.  If you'd like, I can send this to your mychart (if signed up) or email for you to review.  Otherwise, I can obtain your verbal consent now.  All virtual visits are billed to your insurance company just like a normal visit would be.  By agreeing to a virtual visit, we'd like you to understand that the technology does not allow for your provider to perform an examination, and thus may limit your provider's ability to fully assess your condition. If your provider identifies any concerns that need to be evaluated in person, we will make arrangements to do so.  Finally, though the technology is pretty good, we cannot assure that it will always work on either your or our end, and in the setting of a video visit, we may have to convert it to a phone-only visit.  In either situation, we cannot ensure that we have a secure connection.  Are you willing to proceed?"  Yes/new pt/Telephone only/Verbal Consent 12/22/18/vitals  3. Advise patient to be  prepared - "Two hours prior to your appointment, go ahead and check your blood pressure, pulse, oxygen saturation, and your weight (if you have the equipment to check those) and write them all down. When your visit starts, your provider will ask you for this information. If you have an Apple Watch or Kardia device, please plan to have heart rate information ready on the day of your appointment. Please have a pen and paper handy nearby the day of the visit as well."  4. Give patient instructions for MyChart download to smartphone OR Doximity/[email or web address] as below if video visit (depending on what platform provider is using)  5. Inform patient they will receive a phone call 15 minutes prior to their appointment time (may be from unknown caller ID) so they should be prepared to answer    TELEPHONE CALL NOTE  Jared Murray has been deemed a candidate for a follow-up tele-health visit to limit community exposure during the Covid-19 pandemic. I spoke with the patient via phone to ensure availability of phone/video source, confirm preferred email & phone number, and discuss instructions and expectations.  I reminded Jared Murray to be prepared with any vital sign and/or heart rhythm information that could potentially be obtained via home monitoring, at the time of his visit. I reminded Jared Murray to expect a phone call prior to his visit.  Armando Gang 12/22/2018 9:52 AM  INSTRUCTIONS FOR DOWNLOADING THE MYCHART APP TO SMARTPHONE  - The patient must first make sure to have activated MyChart and know their login information - If Apple, go to [employer] and type in MyChart in the search bar and download the app. If Android, ask patient to go to [employer] and type in [place] in the search bar and download the app. The app is free but as with any other app downloads, their phone may require them to verify saved payment information or Apple/Android password.  - The patient will need to  then log into the app with their MyChart username and password, and select [facility] as their healthcare provider to link the account. When it is time for your visit, go to the MyChart app, find appointments, and click Begin Video Visit. Be sure to Select Allow for your device to access the Microphone and Camera for your visit. You will then be connected, and your provider will be with you shortly.  **If they have any issues connecting, or need assistance please contact MyChart service desk [phone])**  **If using a computer, in order to ensure the best quality for their visit they will need to use either of the following Internet Browsers: [employer], or Google Chrome**  IF USING DOXIMITY or DOXY.ME - The patient will receive a link just prior to their visit by text.     FULL LENGTH CONSENT FOR TELE-HEALTH VISIT   I hereby voluntarily request, consent and authorize [facility] and its employed or contracted physicians, physician assistants, nurse practitioners or other licensed health care professionals (the Practitioner), to provide me with telemedicine health care services (the Services") as deemed necessary by the treating Practitioner. I acknowledge and consent to receive the Services by the Practitioner via telemedicine. I understand that the telemedicine visit will involve communicating with the Practitioner through live audiovisual communication technology and the disclosure of certain medical information by electronic transmission. I acknowledge that I have been given the opportunity to request an in-person assessment or other available alternative prior to the telemedicine visit and am voluntarily participating in the telemedicine visit.  I understand that I have the right to withhold or withdraw my consent to the use of telemedicine in the course of my care at any time, without affecting my right to future care or treatment, and that the Practitioner or I may  terminate the telemedicine visit at any time. I understand that I have the right to inspect all information obtained and/or recorded in the course of the telemedicine visit and may receive copies of available information for a reasonable fee.  I understand that some of the potential risks of receiving the Services via telemedicine include:   Delay or interruption in medical evaluation due to technological equipment failure or disruption;  Information transmitted may not be sufficient (e.g. poor resolution of images) to allow for appropriate medical decision making by the Practitioner; and/or   In rare instances, security protocols could fail, causing a breach of personal health information.  Furthermore, I acknowledge that it is my responsibility to provide information about my medical history, conditions and care that is complete and accurate to the best of my ability. I acknowledge that Practitioner's advice, recommendations, and/or decision may be based on factors not within their control, such as incomplete or inaccurate data provided by me or distortions of diagnostic images or specimens that may result from electronic transmissions. I understand that the practice of medicine is not an exact science  and that Practitioner makes no warranties or guarantees regarding treatment outcomes. I acknowledge that I will receive a copy of this consent concurrently upon execution via email to the email address I last provided but may also request a printed copy by calling the office of [facility].    I understand that my insurance will be billed for this visit.   I have read or had this consent read to me.  I understand the contents of this consent, which adequately explains the benefits and risks of the Services being provided via telemedicine.   I have been provided ample opportunity to ask questions regarding this consent and the Services and have had my questions answered to my satisfaction.  I  give my informed consent for the services to be provided through the use of telemedicine in my medical care  By participating in this telemedicine visit I agree to the above.

## 2018-12-23 DIAGNOSIS — I4510 Unspecified right bundle-branch block: Secondary | ICD-10-CM

## 2018-12-23 HISTORY — DX: Unspecified right bundle-branch block: I45.10

## 2018-12-23 NOTE — Progress Notes (Signed)
Virtual Visit via Video Note   This visit type was conducted due to national recommendations for restrictions regarding the COVID-19 Pandemic (e.g. social distancing) in an effort to limit this patient's exposure and mitigate transmission in our community.  Due to his co-morbid illnesses, this patient is at least at moderate risk for complications without adequate follow up.  This format is felt to be most appropriate for this patient at this time.  All issues noted in this document were discussed and addressed.  A limited physical exam was performed with this format.  Please refer to the patient's chart for his consent to telehealth for [facility].   Evaluation Performed: Cardiology Consult  This visit type was conducted due to national recommendations for restrictions regarding the COVID-19 Pandemic (e.g. social distancing).  This format is felt to be most appropriate for this patient at this time.  All issues noted in this document were discussed and addressed.  No physical exam was performed (except for noted visual exam findings with Video Visits).  Please refer to the patient's chart (MyChart message for video visits and phone note for telephone visits) for the patient's consent to telehealth for [facility].  Date:  12/24/2018   ID:  Jared Murray, DOB 1975/10/31, MRN [id number]  Patient Location:  Home  Provider location:   [place]  PCP:  Charlott Rakes, MD  Cardiologist:  NEW Electrophysiologist:  None   Chief Complaint:  Prolonged QT interval  History of Present Illness:    TREVEON BOURCIER is a 43 y.o. male who presents via audio/video conferencing for a telehealth visit today in referral by Juluis Mire, NP for evaluation of prolonged QTc interval.    This is a 43yo AAM with a history of type 1 DM, GERD, polysubstance abuse including tobacco and .  He was recently admitted to [facility] with complaints of intractable N/V and was found to have hypokalemia (K+ 2.9)  and AKI.  He was given IV fluids and potassium suppl. EKG showed a prolonged QT interval at 504 msec and RBBB.  His RBBB is old compared to prior EKGs dating back to 2014.  2D echo in 2017 showed normal LVF with no valvular dz.  QTc after K+ supp was 456mec.    He is here today for followup and is doing well.  He denies any chest pain or pressure, SOB, DOE, PND, orthopnea, LE edema, dizziness, palpitations or syncope. He is compliant with his meds and is tolerating meds with no SE.   he used to smoke but quit recently.  He has no family history of sudden cardiac death.  Interestingly though his mother and his brother both have sarcoidosis.  There is no family history of CAD.  The patient does not have symptoms concerning for COVID-19 infection (fever, chills, cough, or new shortness of breath).    Prior CV studies:   The following studies were reviewed today:  2D echo 2017  Past Medical History:  Diagnosis Date  . Acute pericarditis 03/31/2016  . AKI (acute kidney injury) ([place] 01/22/2015  . Arthritis   . Avascular necrosis ([place] 02/12/2015  . Avascular necrosis of bone of hip ([place]   . Chest wall abscess 04/03/2015  . Chronic diarrhea of unknown origin 09/10/2017  . Cutaneous abscess of chest wall   . DKA (diabetic ketoacidoses) ([facility]   . DKA, type 1, not at goal ([facility] 04/03/2015  . Eczema   . Elevated liver enzymes 11/27/2018  . GERD (gastroesophageal reflux disease)   .  HLD (hyperlipidemia) 11/25/2018  . Hyperbilirubinemia   . Hyperglycemia due to type 1 diabetes mellitus ([place])   . Hyperkalemia   . Hypokalemia 03/17/2013  . Left hip pain 04/18/2014  . Loss of weight 03/17/2013  . Marijuana use 11/25/2018  . Osteoarthritis 12/24/2016  . Prolonged QT interval 11/27/2018   in setting of RBBB and hypokalemia  . Protein-calorie malnutrition, severe ([place]) 03/17/2013  . Psoriasis   . RBBB 12/23/2018   Chronic since at least 2014  . Sepsis ([place]) 01/22/2015  . Substance abuse ([place])   . Tobacco use  disorder 09/13/2015  . Type 1 diabetes mellitus ([place])    Past Surgical History:  Procedure Laterality Date  . FINGER SURGERY Right    middle finger     Current Meds  Medication Sig  . Accu-Chek Softclix Lancets lancets Use as instructed 3 times daily. Dx: Type 1 DM  . acetaminophen-codeine (TYLENOL #3) 300-30 MG tablet Take 1 tablet by mouth every 12 (twelve) hours as needed for moderate pain. Dx:P avascular necrosis of the hip  . aspirin-sod bicarb-citric acid (ALKA-SELTZER) 325 MG TBEF tablet Take 325 mg by mouth every 6 (six) hours as needed (heartburn).  Marland Kitchen atorvastatin (LIPITOR) 20 MG tablet Take 1 tablet (20 mg total) by mouth daily.  . Blood Glucose Monitoring Suppl (ACCU-CHEK AVIVA PLUS) w/Device KIT TEST 3 TIMES DAILY  . clobetasol cream (TEMOVATE) 2.69 % Apply 1 application topically 2 (two) times daily as needed.  . gabapentin (NEURONTIN) 300 MG capsule Take 1 capsule (300 mg total) by mouth 2 (two) times daily.  Marland Kitchen glucose blood (ACCU-CHEK AVIVA PLUS) test strip USE TO CHECK BLOOD SUGAR FOUR TIMES DAILY BEFORE MEALS AND AT BEDTIME  . insulin aspart (NOVOLOG) 100 UNIT/ML injection INJECT 0-12 UNITS INTO THE SKIN THREE TIMES DAILY WITH MEALS AS PER SLIDING SCALE  . Insulin Glargine (BASAGLAR KWIKPEN) 100 UNIT/ML SOPN Inject 0.1 mLs (10 Units total) into the skin daily for 30 days.  . Insulin Syringe-Needle U-100 (B-D INS SYRINGE 2CC/29GX1/2") 29G X 1/2" 2 ML MISC Check Blood sugar TID and QHS  . ondansetron (ZOFRAN ODT) 8 MG disintegrating tablet Take 1 tablet (8 mg total) by mouth every 8 (eight) hours as needed for nausea or vomiting.  . pantoprazole (PROTONIX) 40 MG tablet Take 1 tablet (40 mg total) by mouth daily.     Allergies:   Bee venom; Latex; and Penicillins   Social History   Tobacco Use  . Smoking status: Former Smoker    Packs/day: 0.25    Years: 10.00    Pack years: 2.50    Types: Cigarettes    Last attempt to quit: 11/26/2018    Years since quitting: 0.0  .  Smokeless tobacco: Never Used  Substance Use Topics  . Alcohol use: Yes    Alcohol/week: 1.0 - 2.0 standard drinks    Types: 1 - 2 Shots of liquor per week    Comment: last time tuesday  . Drug use: Yes    Types: Marijuana    Comment: smoked about 2 weeks ago     Family Hx: The patient's family history includes Diabetes in an other family member; Hypertension in his father; Lupus in his cousin; Stroke in his maternal grandmother and paternal grandmother. There is no history of Colon cancer, Stomach cancer, Rectal cancer, or Esophageal cancer.  ROS:   Please see the history of present illness.     All other systems reviewed and are negative.   Labs/Other Tests and  Data Reviewed:    Recent Labs: 11/26/2018: Magnesium 1.9 12/16/2018: ALT 77; BUN 9; Creatinine, Ser 0.91; Hemoglobin 12.4; Platelets 299; Potassium 4.2; Sodium 136   Recent Lipid Panel Lab Results  Component Value Date/Time   CHOL 109 10/19/2018 10:45 AM   TRIG 39 10/19/2018 10:45 AM   HDL 69 10/19/2018 10:45 AM   CHOLHDL 1.6 10/19/2018 10:45 AM   CHOLHDL 2.3 12/19/2015 09:47 AM   LDLCALC 32 10/19/2018 10:45 AM    Wt Readings from Last 3 Encounters:  12/24/18 131 lb (59.4 kg)  12/16/18 123 lb (55.8 kg)  11/25/18 123 lb 10.9 oz (56.1 kg)     Objective:    Vital Signs:  Ht '5\' 10"'  (1.778 m)   Wt 131 lb (59.4 kg)   BMI 18.80 kg/m    CONSTITUTIONAL:  Well nourished, well developed male in no acute distress.  EYES: anicteric MOUTH: oral mucosa is pink RESPIRATORY: Normal respiratory effort, symmetric expansion CARDIOVASCULAR: No peripheral edema SKIN: No rash, lesions or ulcers MUSCULOSKELETAL: no digital cyanosis NEURO: Cranial Nerves II-XII grossly intact, moves all extremities PSYCH: Intact judgement and insight.  A&O x 3, Mood/affect appropriate   ASSESSMENT & PLAN:    1.  Prolonged QTc - this occurred in the setting of severe hypokalemia with a K+ of 2.9 secondary to N/V.  His QTc was 560mec but  this was also in the setting of chronic RBBB.  Repeat EKG once K+ repleted was normal at 4623mc.  No further workup indicated at this time.  Needs to keep K+ supplemented.   2.  Chronic RBBB - he has had a RBBB as far back as 2014.  His 2D echo in 2017 showed normal LVF. He has not had any chest pain or SOB.  He has CRFs including DM, lipids and polysubstance abuse.  He has had this for at least 6 years.  He should have a baseline stress test done due to abnormal EKG and risk factors.   3.  Type 1 DM - this is followed by his PCP and has been poorly controlled.  His HbA1C was 11.4 on 07/19/2018.   He is on NovoLog insulin 3 times daily with meals as well as long-acting insulin 10 units daily.  4.  Hyperlipidemia - his LDL goal is < 70 due to DM.  His last LDL was 32 on 10/19/2018.  He will continue on atorvastatin 20 mg daily.  5.  Tobacco abuse -he has quit  6.  COVID-19 Education:The signs and symptoms of COVID-19 were discussed with the patient and how to seek care for testing (follow up with PCP or arrange E-visit).  The importance of social distancing was discussed today.  Patient Risk:   After full review of this patient's clinical status, I feel that they are at least moderate risk at this time.  Time:   Today, I have spent 20 minutes directly with the patient on video discussing medical problems including prolonged QTc, RBBB, hyperlipidemia, polysubstance abuse.  We also reviewed the symptoms of COVID 19 and the ways to protect against contracting the virus with telehealth technology.  I spent an additional 10 minutes reviewing patient's chart including Hospital notes, Office note from PCP, prior EKGs and echo and labs.  Medication Adjustments/Labs and Tests Ordered: Current medicines are reviewed at length with the patient today.  Concerns regarding medicines are outlined above.  Tests Ordered: No orders of the defined types were placed in this encounter.  Medication Changes: No orders  of  the defined types were placed in this encounter.   Disposition:  Follow up prn  Signed, Fransico Him, MD  12/24/2018 10:22 AM    [place]

## 2018-12-24 ENCOUNTER — Telehealth (INDEPENDENT_AMBULATORY_CARE_PROVIDER_SITE_OTHER): Payer: Medicare Other | Admitting: Cardiology

## 2018-12-24 ENCOUNTER — Encounter: Payer: Self-pay | Admitting: Cardiology

## 2018-12-24 ENCOUNTER — Other Ambulatory Visit: Payer: Self-pay

## 2018-12-24 VITALS — Ht 70.0 in | Wt 131.0 lb

## 2018-12-24 DIAGNOSIS — F17200 Nicotine dependence, unspecified, uncomplicated: Secondary | ICD-10-CM

## 2018-12-24 DIAGNOSIS — I4510 Unspecified right bundle-branch block: Secondary | ICD-10-CM | POA: Diagnosis not present

## 2018-12-24 DIAGNOSIS — E119 Type 2 diabetes mellitus without complications: Secondary | ICD-10-CM | POA: Diagnosis not present

## 2018-12-24 DIAGNOSIS — R809 Proteinuria, unspecified: Secondary | ICD-10-CM

## 2018-12-24 DIAGNOSIS — Z7189 Other specified counseling: Secondary | ICD-10-CM | POA: Diagnosis not present

## 2018-12-24 DIAGNOSIS — R9431 Abnormal electrocardiogram [ECG] [EKG]: Secondary | ICD-10-CM

## 2018-12-24 DIAGNOSIS — E7800 Pure hypercholesterolemia, unspecified: Secondary | ICD-10-CM | POA: Diagnosis not present

## 2018-12-24 DIAGNOSIS — E1029 Type 1 diabetes mellitus with other diabetic kidney complication: Secondary | ICD-10-CM

## 2018-12-24 NOTE — Patient Instructions (Addendum)
Medication Instructions:  Your physician recommends that you continue on your current medications as directed. Please refer to the Current Medication list given to you today.  If you need a refill on your cardiac medications before your next appointment, please call your pharmacy.   Lab work: None If you have labs (blood work) drawn today and your tests are completely normal, you will receive your results only by: Marland Kitchen MyChart Message (if you have MyChart) OR . A paper copy in the mail If you have any lab test that is abnormal or we need to change your treatment, we will call you to review the results.  Testing/Procedures: Your physician has requested that you have a lexiscan myoview. For further information please visit [email or web address]. Please follow instruction sheet, as given.  Your physician has requested that you have an echocardiogram. Echocardiography is a painless test that uses sound waves to create images of your heart. It provides your doctor with information about the size and shape of your heart and how well your heart's chambers and valves are working. This procedure takes approximately one hour. There are no restrictions for this procedure.  Follow-Up: As needed, pending results.   Any Other Special Instructions Will Be Listed Below (If Applicable).  Stress Test Instructions:  You are scheduled for a Myocardial Perfusion Imaging Study.  Please arrive 15 minutes prior to your appointment time for registration and insurance purposes.  The test will take approximately 3 to 4 hours to complete; you may bring reading material.  If someone comes with you to your appointment, they will need to remain in the main lobby due to limited space in the testing area. **If you are pregnant or breastfeeding, please notify the nuclear lab prior to your appointment**  How to prepare for your Myocardial Perfusion Test: . Do not eat or drink 3 hours prior to your test, except you may have  water. . Do not consume products containing caffeine (regular or decaffeinated) 12 hours prior to your test. (ex: coffee, chocolate, sodas, tea). . Do wear comfortable clothes (no dresses or overalls) and walking shoes, tennis shoes preferred (No heels or open toe shoes are allowed). . Do NOT wear cologne, perfume, aftershave, or lotions (deodorant is allowed). . If these instructions are not followed, your test will have to be rescheduled.  Please report to [address] for your test.  If you have questions or concerns about your appointment, you can call the Nuclear Lab at [phone].  If you cannot keep your appointment, please provide 24 hours notification to the Nuclear Lab, to avoid a possible $50 charge to your account.

## 2018-12-27 MED FILL — GABAPENTIN 300 MG CAPSULE: 300 | 30 days supply | Qty: 60 | Fill #2

## 2018-12-27 MED FILL — PANTOPRAZOLE SOD DR 40 MG T: 40 | 30 days supply | Qty: 30 | Fill #2

## 2019-01-11 IMAGING — CT CT ABD-PELV W/ CM
2 of 5 series · 16 of 46 positions shown, 18 images · IV contrast (APPLIED)
Comparison: None.

CLINICAL DATA: Chronic diarrhea.  Nausea and vomiting.

EXAM:
CT ABDOMEN AND PELVIS WITH CONTRAST
TECHNIQUE: Multidetector CT imaging of the abdomen and pelvis was performed
using the standard protocol following bolus administration of
intravenous contrast.
CONTRAST:  100mL [id number] IOPAMIDOL ([id number]) INJECTION 61%

[Series 2: axial st · axial · 0.67mm/px · z∈[-455,-80]mm · 13 of 89 slices shown, 15 images]
[im 7/89  soft-tissue]
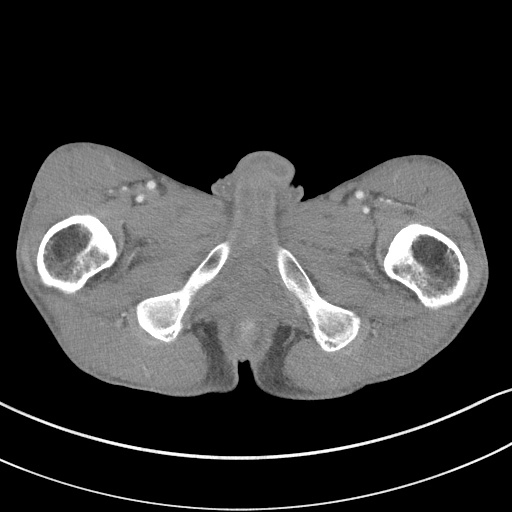
[im 7/89  bone]
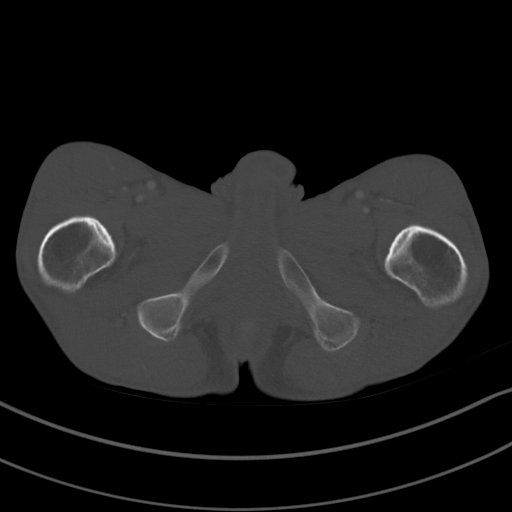
[im 13/89  soft-tissue]
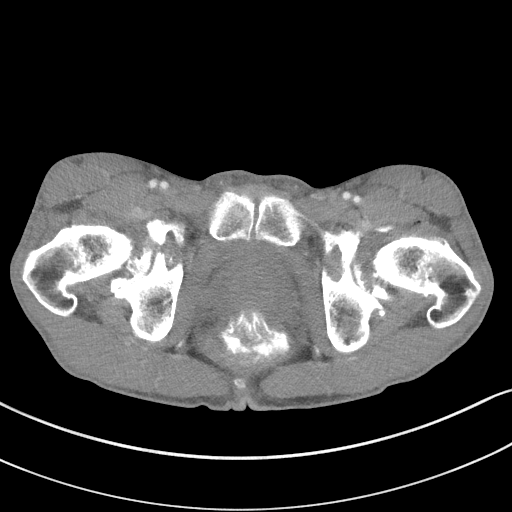
[im 19/89  soft-tissue]
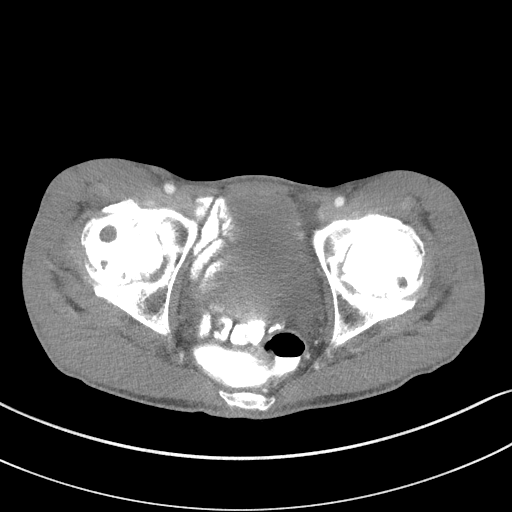
[im 26/89  soft-tissue]
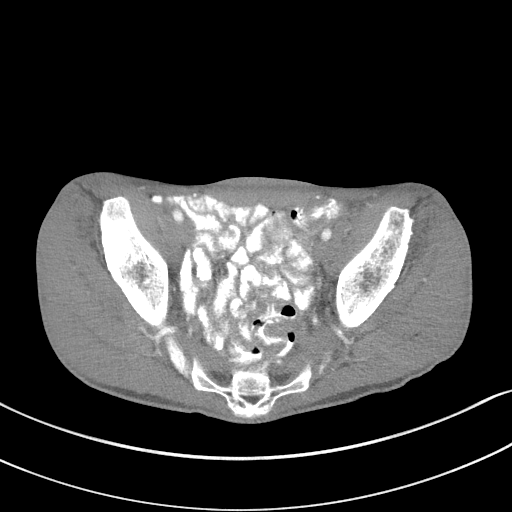
[im 32/89  soft-tissue]
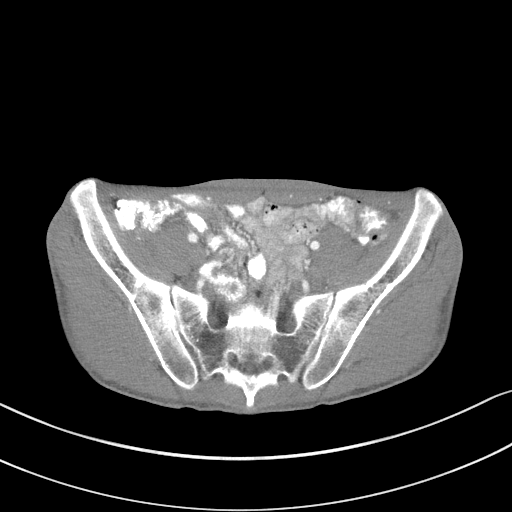
[im 38/89  soft-tissue]
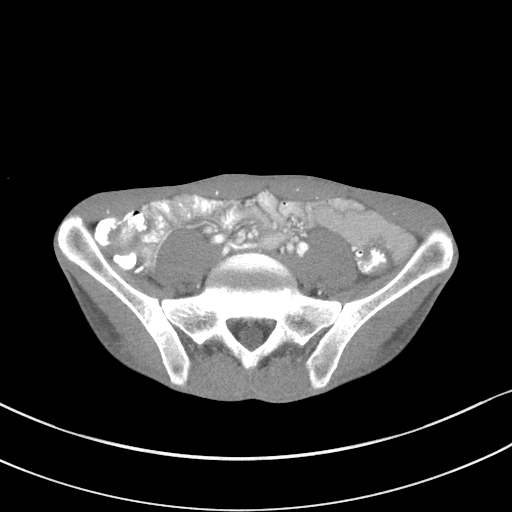
[im 45/89  soft-tissue]
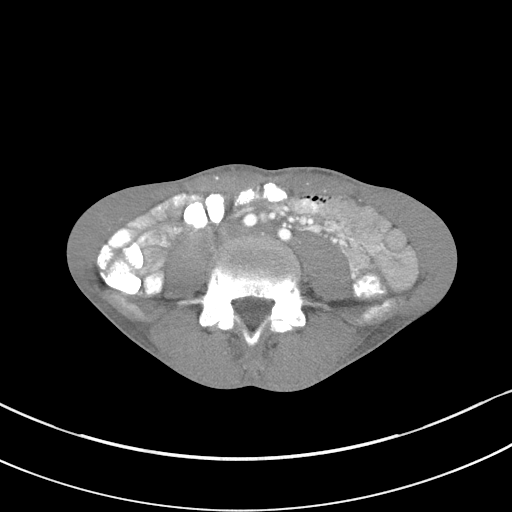
[im 51/89  soft-tissue]
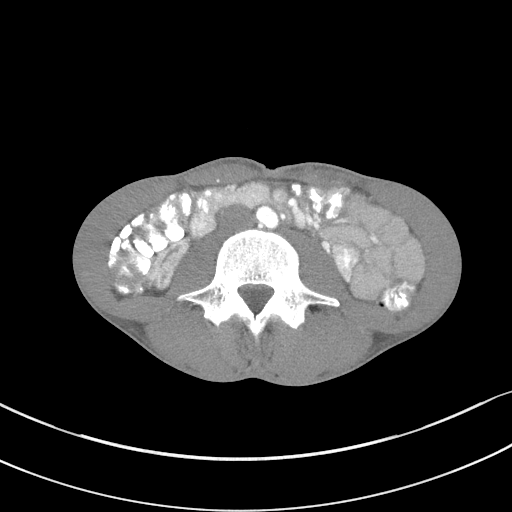
[im 57/89  soft-tissue]
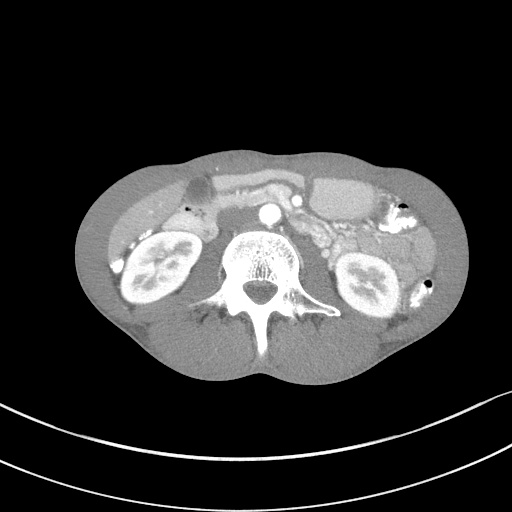
[im 57/89  bone]
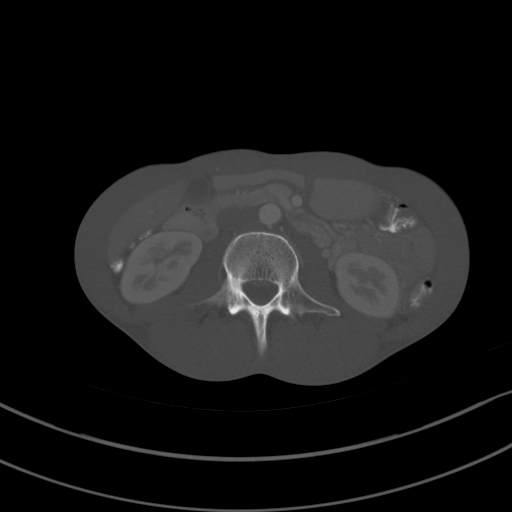
[im 63/89  soft-tissue]
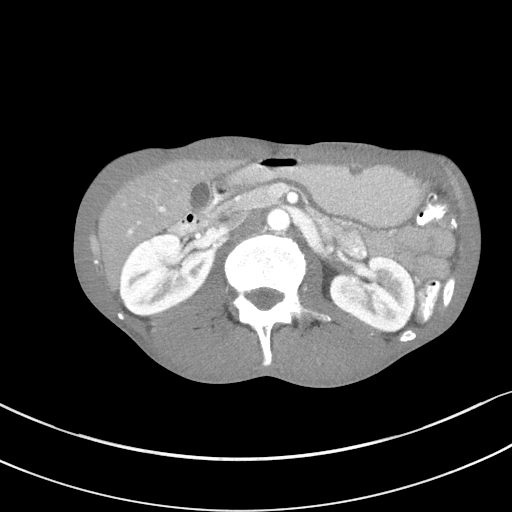
[im 70/89  soft-tissue]
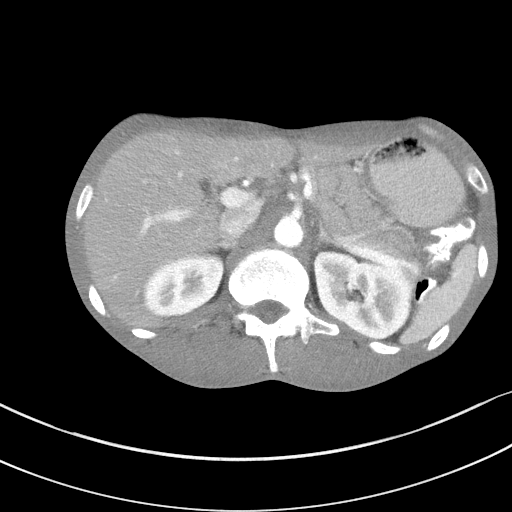
[im 76/89  soft-tissue]
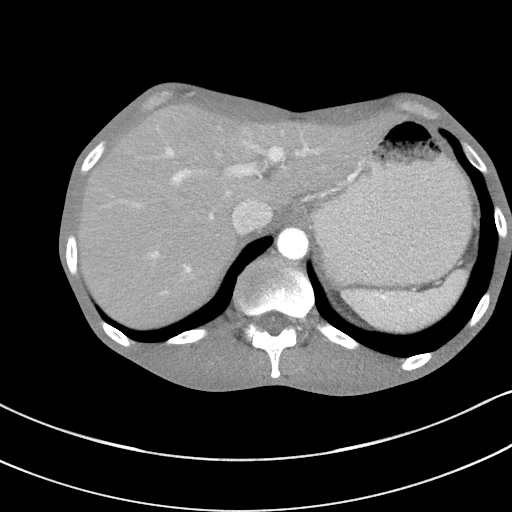
[im 82/89  soft-tissue]
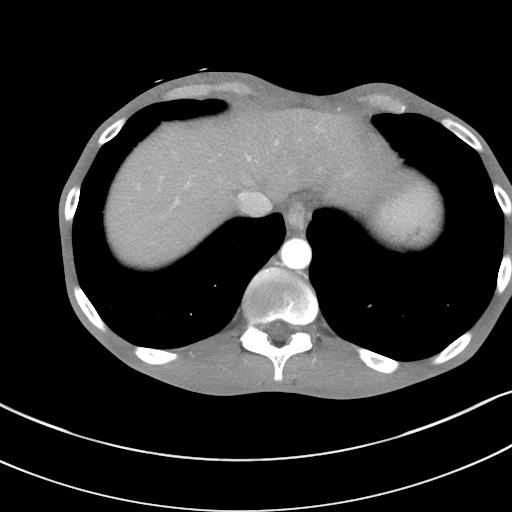

[Series 5: coronal st · coronal · 0.69mm/px · 3 of 79 slices shown]
[im 27/79  soft-tissue]
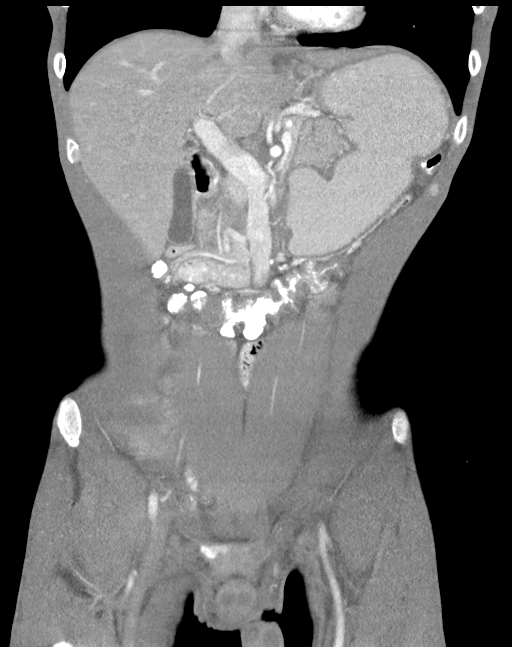
[im 35/79  soft-tissue]
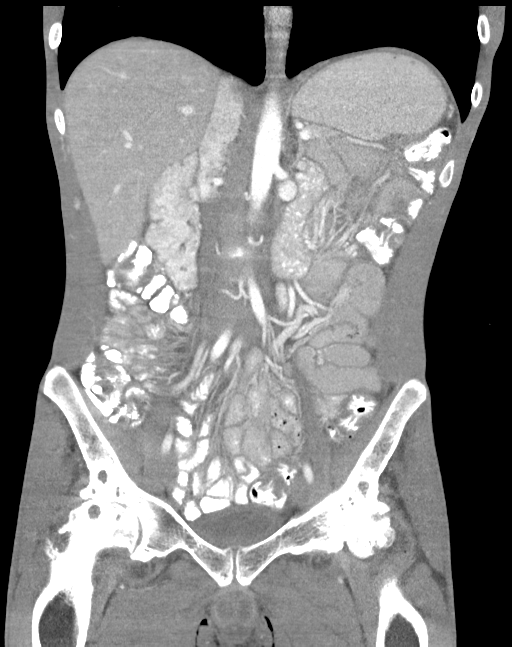
[im 44/79  soft-tissue]
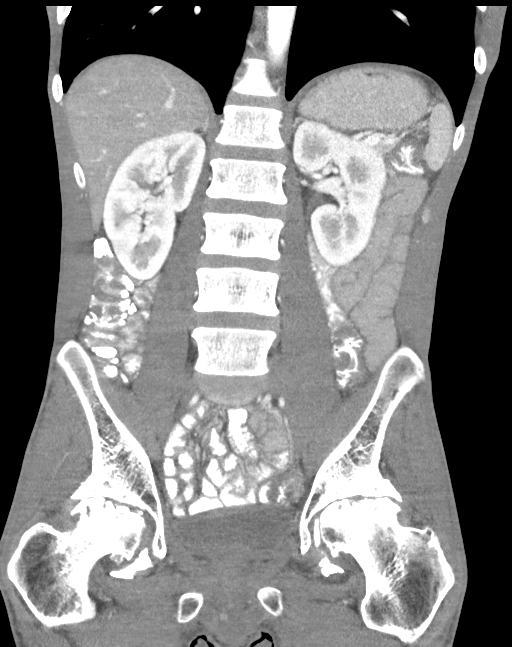

[16 of 46 positions shown; findings below may reference images not displayed]

FINDINGS: Lower chest: The lung bases are clear.

Hepatobiliary: No focal hepatic lesion. Mild decreased hepatic
density suggests steatosis. Gallbladder minimally distended, no
calcified stone or pericholecystic inflammation.

Pancreas: Not well visualized likely secondary to parenchymal
atrophy.

Spleen: Normal in size without focal abnormality.

Adrenals/Urinary Tract: Normal adrenal glands. No hydronephrosis or
perinephric edema. Homogeneous renal enhancement with symmetric
excretion on delayed phase imaging. 14 mm cyst in the mid right
kidney. Urinary bladder is physiologically distended without wall
thickening.

Stomach/Bowel: Mild gastric distention with enteric contrast.
Enteric contrast in the distal esophagus with small hiatal hernia.
No small bowel inflammation, wall thickening or obstruction.
Borderline diffuse colonic wall thickening without pericolonic
inflammatory change. Normal appendix.

Vascular/Lymphatic: No acute vascular abnormality. No bulky
adenopathy.

Reproductive: Prostate is unremarkable.

Other: No free air or ascites. Generalized paucity of subcutaneous
and intra-abdominal fat.

Musculoskeletal: Advanced chronic changes about both hips with joint
space narrowing, large subchondral cysts with subchondral sclerosis
and flattening of both femoral heads. No acute osseous abnormality.
IMPRESSION: 1. Borderline diffuse colonic wall thickening without pericolonic
inflammation, of unknown significance in etiology.
2. Gastric distention can be seen with gastroparesis. Enteric
contrast in the distal esophagus may be delayed transit or reflux.
3. Mild hepatic steatosis.
4. Chronic changes of both hips may be due to age advanced
osteoarthritis or sequela of AVN.

## 2019-01-17 ENCOUNTER — Telehealth (HOSPITAL_COMMUNITY): Payer: Self-pay

## 2019-01-17 NOTE — Telephone Encounter (Signed)
Patient contacted, instructions were given and he stated that he understood. S.Erie Radu EMTP

## 2019-01-17 NOTE — Telephone Encounter (Signed)

## 2019-01-18 ENCOUNTER — Other Ambulatory Visit: Payer: Self-pay

## 2019-01-18 ENCOUNTER — Ambulatory Visit (HOSPITAL_COMMUNITY): Payer: Medicare Other

## 2019-01-18 ENCOUNTER — Ambulatory Visit (HOSPITAL_COMMUNITY): Payer: Medicare Other | Attending: Cardiology

## 2019-01-18 DIAGNOSIS — I4510 Unspecified right bundle-branch block: Secondary | ICD-10-CM | POA: Insufficient documentation

## 2019-01-18 LAB — ECHOCARDIOGRAM COMPLETE
Height: 70 in
Weight: 2096 oz

## 2019-01-21 ENCOUNTER — Other Ambulatory Visit: Payer: Self-pay

## 2019-01-21 ENCOUNTER — Ambulatory Visit (HOSPITAL_COMMUNITY): Payer: Medicare Other | Attending: Cardiology

## 2019-01-21 VITALS — Ht 69.0 in | Wt 131.0 lb

## 2019-01-21 DIAGNOSIS — I4510 Unspecified right bundle-branch block: Secondary | ICD-10-CM | POA: Diagnosis not present

## 2019-01-21 DIAGNOSIS — R9431 Abnormal electrocardiogram [ECG] [EKG]: Secondary | ICD-10-CM | POA: Insufficient documentation

## 2019-01-21 LAB — MYOCARDIAL PERFUSION IMAGING
LV dias vol: 71 mL (ref 62–150)
LV sys vol: 29 mL
Peak HR: 102 {beats}/min
Rest HR: 80 {beats}/min
SDS: 1
SRS: 0
SSS: 1
TID: 1.24

## 2019-01-21 MED ORDER — TECHNETIUM TC 99M TETROFOSMIN IV KIT
10.3000 | PACK | Freq: Once | INTRAVENOUS | Status: AC | PRN
  Administered 2019-01-21: 10.3 via INTRAVENOUS
  Filled 2019-01-21: qty 11

## 2019-01-21 MED ORDER — REGADENOSON 0.4 MG/5ML IV SOLN
0.4000 mg | Freq: Once | INTRAVENOUS | Status: AC
  Administered 2019-01-21: 0.4 mg via INTRAVENOUS

## 2019-01-21 MED ORDER — TECHNETIUM TC 99M TETROFOSMIN IV KIT
31.5000 | PACK | Freq: Once | INTRAVENOUS | Status: AC | PRN
  Administered 2019-01-21: 31.5 via INTRAVENOUS
  Filled 2019-01-21: qty 32

## 2019-01-26 ENCOUNTER — Encounter: Payer: Self-pay | Admitting: Family Medicine

## 2019-01-26 ENCOUNTER — Other Ambulatory Visit: Payer: Self-pay

## 2019-01-26 ENCOUNTER — Ambulatory Visit: Payer: Medicare Other | Attending: Family Medicine | Admitting: Family Medicine

## 2019-01-26 DIAGNOSIS — E1049 Type 1 diabetes mellitus with other diabetic neurological complication: Secondary | ICD-10-CM

## 2019-01-26 DIAGNOSIS — M8700 Idiopathic aseptic necrosis of unspecified bone: Secondary | ICD-10-CM | POA: Diagnosis not present

## 2019-01-26 DIAGNOSIS — E1065 Type 1 diabetes mellitus with hyperglycemia: Secondary | ICD-10-CM | POA: Diagnosis not present

## 2019-01-26 MED ORDER — ACCU-CHEK SOFTCLIX LANCETS MISC: 5 refills | Status: DC

## 2019-01-26 MED ORDER — GABAPENTIN 300 MG PO CAPS: 300.0000 mg | ORAL_CAPSULE | Freq: Two times a day (BID) | ORAL | 3 refills | Status: DC

## 2019-01-26 MED ORDER — ATORVASTATIN CALCIUM 20 MG PO TABS: 20.0000 mg | ORAL_TABLET | Freq: Every day | ORAL | 0 refills | Status: DC

## 2019-01-26 MED ORDER — PANTOPRAZOLE SODIUM 40 MG PO TBEC: 40.0000 mg | DELAYED_RELEASE_TABLET | Freq: Every day | ORAL | 5 refills | Status: DC

## 2019-01-26 MED ORDER — GLUCOSE BLOOD VI STRP: ORAL_STRIP | 11 refills | Status: DC

## 2019-01-26 MED FILL — GABAPENTIN 300 MG CAPSULE: 300 | 30 days supply | Qty: 60 | Fill #0

## 2019-01-26 MED FILL — PANTOPRAZOLE SOD DR 40 MG T: 40 | 30 days supply | Qty: 30 | Fill #0

## 2019-01-26 NOTE — Progress Notes (Signed)
Patient has been called and DOB has been verified. Patient has been screened and transferred to PCP to start phone visit.     

## 2019-01-26 NOTE — Progress Notes (Signed)
Virtual Visit via Telephone Note  I connected with Jared Murray, on 01/26/2019 at 9:03 AM by telephone due to the COVID-19 pandemic and verified that I am speaking with the correct person using two identifiers.   Consent: I discussed the limitations, risks, security and privacy concerns of performing an evaluation and management service by telephone and the availability of in person appointments. I also discussed with the patient that there may be a patient responsible charge related to this service. The patient expressed understanding and agreed to proceed.   Location of Patient: Patient's home  Location of Provider: Clinic   Persons participating in Telemedicine visit: Blaike L Shaune Leeks [place]-CMA Dr. Felecia Shelling     History of Present Illness: Jared Murray is a 43 year old male with a history of uncontrolled type 1 diabetes mellitus (A1c 11.3), noncompliance, multiple hospital admissions for DKA, avascular necrosis of both hips, Psoriasis who comes into the clinic for a follow-up visit. He was previously followed by [facility] endocrinology however he has not been back to see them in a while due to problems with his Medicaid but endorses compliance with his prescribed regimen of insulin but adherence with a diabetic diet cannot be ascertained.   He was on 15 units of Lantus in the morning which he reduced to 10 units due to hypoglycemia but then has increased back to 15 units and he administers this along with NovoLog on a sliding scale however he is unable to verbalize his correction factor but states he has been administering it. He informs me his fasting sugar was 70 yesterday but he has had labile random sugars between 200 and 425.  He had a cardiology visit due to prolonged QTC of 504 with an old RBBB.  QTc was 466 after supplementation with potassium during his hospitalization.  Cardiology performed Myoview which was normal. He denies chest pain or dyspnea and is  requesting refills today.  Past Medical History:  Diagnosis Date  . Acute pericarditis 03/31/2016  . AKI (acute kidney injury) ([place]) 01/22/2015  . Arthritis   . Avascular necrosis ([place]) 02/12/2015  . Avascular necrosis of bone of hip ([place])   . Chest wall abscess 04/03/2015  . Chronic diarrhea of unknown origin 09/10/2017  . Cutaneous abscess of chest wall   . DKA (diabetic ketoacidoses) ([place])   . DKA, type 1, not at goal [facility]) 04/03/2015  . Eczema   . Elevated liver enzymes 11/27/2018  . GERD (gastroesophageal reflux disease)   . HLD (hyperlipidemia) 11/25/2018  . Hyperbilirubinemia   . Hyperglycemia due to type 1 diabetes mellitus ([place])   . Hyperkalemia   . Hypokalemia 03/17/2013  . Left hip pain 04/18/2014  . Loss of weight 03/17/2013  . Marijuana use 11/25/2018  . Osteoarthritis 12/24/2016  . Prolonged QT interval 11/27/2018   in setting of RBBB and hypokalemia  . Protein-calorie malnutrition, severe ([place]) 03/17/2013  . Psoriasis   . RBBB 12/23/2018   Chronic since at least 2014  . Sepsis ([place]) 01/22/2015  . Substance abuse ([place])   . Tobacco use disorder 09/13/2015  . Type 1 diabetes mellitus (HCC)    Allergies  Allergen Reactions  . Bee Venom   . Latex Itching and Rash  . Penicillins Other (See Comments)    Childhood allergy Has patient had a PCN reaction causing immediate rash, facial/tongue/throat swelling, SOB or lightheadedness with hypotension: NO Has patient had a PCN reaction causing severe rash involving mucus membranes or skin necrosis:NO Has patient had  a PCN reaction that required hospitalization NO Has patient had a PCN reaction occurring within the last 10 years: NO If all of the above answers are "NO", then may proceed with Cephalosporin use.    Current Outpatient Medications on File Prior to Visit  Medication Sig Dispense Refill  . Accu-Chek Softclix Lancets lancets Use as instructed 3 times daily. Dx: Type 1 DM 100 each 5  . acetaminophen-codeine (TYLENOL #3) 300-30 MG  tablet Take 1 tablet by mouth every 12 (twelve) hours as needed for moderate pain. Dx:P avascular necrosis of the hip 60 tablet 0  . aspirin-sod bicarb-citric acid (ALKA-SELTZER) 325 MG TBEF tablet Take 325 mg by mouth every 6 (six) hours as needed (heartburn).    Marland Kitchen atorvastatin (LIPITOR) 20 MG tablet Take 1 tablet (20 mg total) by mouth daily. 90 tablet 0  . Blood Glucose Monitoring Suppl (ACCU-CHEK AVIVA PLUS) w/Device KIT TEST 3 TIMES DAILY 1 kit 0  . clobetasol cream (TEMOVATE) 0.03 % Apply 1 application topically 2 (two) times daily as needed.    . gabapentin (NEURONTIN) 300 MG capsule Take 1 capsule (300 mg total) by mouth 2 (two) times daily. 60 capsule 3  . glucose blood (ACCU-CHEK AVIVA PLUS) test strip USE TO CHECK BLOOD SUGAR FOUR TIMES DAILY BEFORE MEALS AND AT BEDTIME 100 each 11  . insulin aspart (NOVOLOG) 100 UNIT/ML injection INJECT 0-12 UNITS INTO THE SKIN THREE TIMES DAILY WITH MEALS AS PER SLIDING SCALE 10 mL 2  . Insulin Syringe-Needle U-100 (B-D INS SYRINGE 2CC/29GX1/2") 29G X 1/2" 2 ML MISC Check Blood sugar TID and QHS 100 each 12  . ondansetron (ZOFRAN ODT) 8 MG disintegrating tablet Take 1 tablet (8 mg total) by mouth every 8 (eight) hours as needed for nausea or vomiting. 12 tablet 0  . pantoprazole (PROTONIX) 40 MG tablet Take 1 tablet (40 mg total) by mouth daily. 30 tablet 5  . Insulin Glargine (BASAGLAR KWIKPEN) 100 UNIT/ML SOPN Inject 0.1 mLs (10 Units total) into the skin daily for 30 days. 3 mL 0   No current facility-administered medications on file prior to visit.     Observations/Objective: Awake, alert, oriented x3 Not in acute distress  CMP Latest Ref Rng & Units 12/16/2018 11/26/2018 11/25/2018  Glucose 65 - 99 mg/dL 161(H) 135(H) 234(H)  BUN 6 - 24 mg/dL 9 <5(L) 9  Creatinine 0.76 - 1.27 mg/dL 0.91 0.68 0.96  Sodium 134 - 144 mmol/L 136 138 135  Potassium 3.5 - 5.2 mmol/L 4.2 3.2(L) 2.9(L)  Chloride 96 - 106 mmol/L 103 100 94(L)  CO2 20 - 29 mmol/L 19(L)  26 28  Calcium 8.7 - 10.2 mg/dL 9.5 8.2(L) 7.3(L)  Total Protein 6.0 - 8.5 g/dL 6.3 - -  Total Bilirubin 0.0 - 1.2 mg/dL 0.7 - -  Alkaline Phos 39 - 117 IU/L 153(H) - -  AST 0 - 40 IU/L 38 - -  ALT 0 - 44 IU/L 77(H) - -    Lipid Panel     Component Value Date/Time   CHOL 109 10/19/2018 1045   TRIG 39 10/19/2018 1045   HDL 69 10/19/2018 1045   CHOLHDL 1.6 10/19/2018 1045   CHOLHDL 2.3 12/19/2015 0947   VLDL 12 12/19/2015 0947   LDLCALC 32 10/19/2018 1045    Lab Results  Component Value Date   HGBA1C 11.3 (A) 10/19/2018    Assessment and Plan: 1. Type 1 diabetes mellitus with other neurologic complication (HCC) Uncontrolled with A1c of 11.3-labile sugars He will  need to see his endocrinologist and I have strongly encouraged him to make an appointment as soon as we get done with this visit Encouraged to comply with a diabetic diet and lifestyle modifications. - gabapentin (NEURONTIN) 300 MG capsule; Take 1 capsule (300 mg total) by mouth 2 (two) times daily.  Dispense: 60 capsule; Refill: 3 - glucose blood (ACCU-CHEK AVIVA PLUS) test strip; USE TO CHECK BLOOD SUGAR FOUR TIMES DAILY BEFORE MEALS AND AT BEDTIME  Dispense: 120 each; Refill: 11  2. Type 1 diabetes mellitus with hyperglycemia (HCC) See #1 above - atorvastatin (LIPITOR) 20 MG tablet; Take 1 tablet (20 mg total) by mouth daily.  Dispense: 90 tablet; Refill: 0  3. Avascular necrosis ([place]) Unable to undergo surgery due to hyperglycemia He was referred to [facility] pain management but never got a follow-up appointment due to the ongoing COVID-19 pandemic I have provided him with a phone number to call for an appointment   Follow Up Instructions: 3 months   I discussed the assessment and treatment plan with the patient. The patient was provided an opportunity to ask questions and all were answered. The patient agreed with the plan and demonstrated an understanding of the instructions.   The patient was advised to  call back or seek an in-person evaluation if the symptoms worsen or if the condition fails to improve as anticipated.     I provided 16 minutes total of non-face-to-face time during this encounter including median intraservice time, reviewing previous notes, labs, imaging, medications, management and patient verbalized understanding.     Charlott Rakes, MD, FAAFP. [facility] and [facility], [place]   01/26/2019, 9:03 AM

## 2019-05-09 MED FILL — BASAGLAR 100 UNIT/ML KWIKPE: 100 | 30 days supply | Qty: 3 | Fill #0

## 2019-05-31 ENCOUNTER — Other Ambulatory Visit: Payer: Self-pay | Admitting: Pharmacist

## 2019-05-31 MED ORDER — BASAGLAR KWIKPEN 100 UNIT/ML ~~LOC~~ SOPN: 15.0000 [IU] | PEN_INJECTOR | Freq: Every day | SUBCUTANEOUS | 0 refills | Status: DC

## 2019-06-01 MED FILL — BASAGLAR 100 UNIT/ML KWIKPE: 100 | 40 days supply | Qty: 6 | Fill #0

## 2019-07-07 ENCOUNTER — Telehealth: Payer: Self-pay | Admitting: Family Medicine

## 2019-07-07 MED ORDER — BASAGLAR KWIKPEN 100 UNIT/ML ~~LOC~~ SOPN: 15.0000 [IU] | PEN_INJECTOR | Freq: Every day | SUBCUTANEOUS | 0 refills | Status: DC

## 2019-07-07 NOTE — Telephone Encounter (Signed)
1) Medication(s) Requested (by name): Insulin Glargine (BASAGLAR KWIKPEN) 100 UNIT/ML SOPN   2) Pharmacy of Choice: [facility]   3) Special Requests:   Approved medications will be sent to the pharmacy, we will reach out if there is an issue.  Requests made after 3pm may not be addressed until the following business day!  If a patient is unsure of the name of the medication(s) please note and ask patient to call back when they are able to provide all info, do not send to responsible party until all information is available!

## 2019-07-07 NOTE — Telephone Encounter (Signed)
Patient has not been seen since 01/26/2019.

## 2019-07-07 NOTE — Telephone Encounter (Signed)
Done. He needs a visit.

## 2019-07-08 MED FILL — BASAGLAR 100 UNIT/ML KWIKPE: 100 | 20 days supply | Qty: 3 | Fill #0

## 2019-07-08 NOTE — Telephone Encounter (Signed)
Patient was called and informed of medication refill and upcoming appointment.

## 2019-07-29 MED FILL — PANTOPRAZOLE SOD DR 40 MG T: 40 | 30 days supply | Qty: 30 | Fill #1

## 2019-08-02 ENCOUNTER — Other Ambulatory Visit: Payer: Self-pay

## 2019-08-02 ENCOUNTER — Encounter: Payer: Self-pay | Admitting: Family Medicine

## 2019-08-02 ENCOUNTER — Ambulatory Visit: Payer: Medicare Other | Attending: Family Medicine | Admitting: Family Medicine

## 2019-08-02 VITALS — BP 102/69 | HR 83 | Temp 98.5°F | Ht 70.0 in | Wt 136.0 lb

## 2019-08-02 DIAGNOSIS — E785 Hyperlipidemia, unspecified: Secondary | ICD-10-CM | POA: Diagnosis not present

## 2019-08-02 DIAGNOSIS — Z79899 Other long term (current) drug therapy: Secondary | ICD-10-CM | POA: Diagnosis not present

## 2019-08-02 DIAGNOSIS — Z880 Allergy status to penicillin: Secondary | ICD-10-CM | POA: Insufficient documentation

## 2019-08-02 DIAGNOSIS — Z794 Long term (current) use of insulin: Secondary | ICD-10-CM | POA: Insufficient documentation

## 2019-08-02 DIAGNOSIS — E1040 Type 1 diabetes mellitus with diabetic neuropathy, unspecified: Secondary | ICD-10-CM | POA: Insufficient documentation

## 2019-08-02 DIAGNOSIS — Z91030 Bee allergy status: Secondary | ICD-10-CM | POA: Diagnosis not present

## 2019-08-02 DIAGNOSIS — F1221 Cannabis dependence, in remission: Secondary | ICD-10-CM | POA: Insufficient documentation

## 2019-08-02 DIAGNOSIS — M1990 Unspecified osteoarthritis, unspecified site: Secondary | ICD-10-CM | POA: Insufficient documentation

## 2019-08-02 DIAGNOSIS — K219 Gastro-esophageal reflux disease without esophagitis: Secondary | ICD-10-CM | POA: Diagnosis not present

## 2019-08-02 DIAGNOSIS — Z823 Family history of stroke: Secondary | ICD-10-CM | POA: Diagnosis not present

## 2019-08-02 DIAGNOSIS — Z833 Family history of diabetes mellitus: Secondary | ICD-10-CM | POA: Diagnosis not present

## 2019-08-02 DIAGNOSIS — Z8249 Family history of ischemic heart disease and other diseases of the circulatory system: Secondary | ICD-10-CM | POA: Diagnosis not present

## 2019-08-02 DIAGNOSIS — E1065 Type 1 diabetes mellitus with hyperglycemia: Secondary | ICD-10-CM | POA: Diagnosis not present

## 2019-08-02 DIAGNOSIS — E1049 Type 1 diabetes mellitus with other diabetic neurological complication: Secondary | ICD-10-CM

## 2019-08-02 DIAGNOSIS — Z91040 Latex allergy status: Secondary | ICD-10-CM | POA: Diagnosis not present

## 2019-08-02 DIAGNOSIS — I4510 Unspecified right bundle-branch block: Secondary | ICD-10-CM | POA: Diagnosis not present

## 2019-08-02 LAB — GLUCOSE, POCT (MANUAL RESULT ENTRY): POC Glucose: 205 mg/dl — AB (ref 70–99)

## 2019-08-02 LAB — POCT GLYCOSYLATED HEMOGLOBIN (HGB A1C): HbA1c, POC (controlled diabetic range): 13 % — AB (ref 0.0–7.0)

## 2019-08-02 MED ORDER — ATORVASTATIN CALCIUM 20 MG PO TABS: 20.0000 mg | ORAL_TABLET | Freq: Every day | ORAL | 0 refills | Status: DC

## 2019-08-02 MED ORDER — BASAGLAR KWIKPEN 100 UNIT/ML ~~LOC~~ SOPN: 15.0000 [IU] | PEN_INJECTOR | Freq: Every day | SUBCUTANEOUS | 2 refills | Status: DC

## 2019-08-02 MED ORDER — GABAPENTIN 300 MG PO CAPS: 300.0000 mg | ORAL_CAPSULE | Freq: Two times a day (BID) | ORAL | 3 refills | Status: DC

## 2019-08-02 MED ORDER — INSULIN ASPART 100 UNIT/ML ~~LOC~~ SOLN: SUBCUTANEOUS | 2 refills | Status: DC

## 2019-08-02 MED ORDER — PANTOPRAZOLE SODIUM 40 MG PO TBEC: 40.0000 mg | DELAYED_RELEASE_TABLET | Freq: Every day | ORAL | 5 refills | Status: DC

## 2019-08-02 MED FILL — NovoLOG 100 UNIT/ML SOLN: 100 | 27 days supply | Qty: 10 | Fill #0

## 2019-08-02 MED FILL — BASAGLAR 100 UNIT/ML KWIKPE: 100 | 20 days supply | Qty: 3 | Fill #0

## 2019-08-02 NOTE — Progress Notes (Signed)
Subjective:  Patient ID: Jared Murray, male    DOB: 1975-09-22  Age: 43 y.o. MRN: [id number]  CC: Diabetes   HPI Jared Murray is a 43 year old male with a history of uncontrolled type 1 diabetes mellitus (A1c 13.0), noncompliance, multiple hospital admissions for DKA, avascular necrosis of both hips, Psoriasis here for a follow up visit. Previously followed by [facility], [place] but was last seen early last year. He has several excuses as he has always had at previous visits as to why he has not made it back there  He is on Basaglar 15 units but states he had to decrease it to 14 units due to hypoglycemia; also on Novolog sliding scale as per Endocrine instructions.  His sugars have been in the 200s he says and when additional questions were asked he added he has had some sugars in the 400s. He is not compliant with a diabetic diet as his family eats whatever they want and he doesn't get to have a diabetic diet. Reflux symptoms are controlled and he is requesting a refill of his PPI.   Past Medical History:  Diagnosis Date  . Acute pericarditis 03/31/2016  . AKI (acute kidney injury) ([place]) 01/22/2015  . Arthritis   . Avascular necrosis ([place]) 02/12/2015  . Avascular necrosis of bone of hip ([place])   . Chest wall abscess 04/03/2015  . Chronic diarrhea of unknown origin 09/10/2017  . Cutaneous abscess of chest wall   . DKA (diabetic ketoacidoses) ([place])   . DKA, type 1, not at goal [facility]) 04/03/2015  . Eczema   . Elevated liver enzymes 11/27/2018  . GERD (gastroesophageal reflux disease)   . HLD (hyperlipidemia) 11/25/2018  . Hyperbilirubinemia   . Hyperglycemia due to type 1 diabetes mellitus ([place])   . Hyperkalemia   . Hypokalemia 03/17/2013  . Left hip pain 04/18/2014  . Loss of weight 03/17/2013  . Marijuana use 11/25/2018  . Osteoarthritis 12/24/2016  . Prolonged QT interval 11/27/2018   in setting of RBBB and hypokalemia  . Protein-calorie malnutrition, severe ([place]) 03/17/2013    . Psoriasis   . RBBB 12/23/2018   Chronic since at least 2014  . Sepsis ([place]) 01/22/2015  . Substance abuse ([place])   . Tobacco use disorder 09/13/2015  . Type 1 diabetes mellitus ([place])     Past Surgical History:  Procedure Laterality Date  . FINGER SURGERY Right    middle finger    Family History  Problem Relation Age of Onset  . Hypertension Father   . Diabetes Other        multiple  . Lupus Cousin   . Stroke Maternal Grandmother   . Stroke Paternal Grandmother   . Colon cancer Neg Hx   . Stomach cancer Neg Hx   . Rectal cancer Neg Hx   . Esophageal cancer Neg Hx     Allergies  Allergen Reactions  . Bee Venom   . Latex Itching and Rash  . Penicillins Other (See Comments)    Childhood allergy Has patient had a PCN reaction causing immediate rash, facial/tongue/throat swelling, SOB or lightheadedness with hypotension: NO Has patient had a PCN reaction causing severe rash involving mucus membranes or skin necrosis:NO Has patient had a PCN reaction that required hospitalization NO Has patient had a PCN reaction occurring within the last 10 years: NO If all of the above answers are "NO", then may proceed with Cephalosporin use.    Outpatient Medications Prior to Visit  Medication Sig Dispense Refill  . Accu-Chek Softclix Lancets lancets Use as instructed 3 times daily. Dx: Type 1 DM 120 each 5  . acetaminophen-codeine (TYLENOL #3) 300-30 MG tablet Take 1 tablet by mouth every 12 (twelve) hours as needed for moderate pain. Dx:P avascular necrosis of the hip 60 tablet 0  . aspirin-sod bicarb-citric acid (ALKA-SELTZER) 325 MG TBEF tablet Take 325 mg by mouth every 6 (six) hours as needed (heartburn).    Marland Kitchen atorvastatin (LIPITOR) 20 MG tablet Take 1 tablet (20 mg total) by mouth daily. 90 tablet 0  . Blood Glucose Monitoring Suppl (ACCU-CHEK AVIVA PLUS) w/Device KIT TEST 3 TIMES DAILY 1 kit 0  . clobetasol cream (TEMOVATE) 8.09 % Apply 1 application topically 2 (two) times daily as  needed.    . gabapentin (NEURONTIN) 300 MG capsule Take 1 capsule (300 mg total) by mouth 2 (two) times daily. 60 capsule 3  . glucose blood (ACCU-CHEK AVIVA PLUS) test strip USE TO CHECK BLOOD SUGAR FOUR TIMES DAILY BEFORE MEALS AND AT BEDTIME 120 each 11  . insulin aspart (NOVOLOG) 100 UNIT/ML injection INJECT 0-12 UNITS INTO THE SKIN THREE TIMES DAILY WITH MEALS AS PER SLIDING SCALE 10 mL 2  . Insulin Glargine (BASAGLAR KWIKPEN) 100 UNIT/ML SOPN Inject 0.15 mLs (15 Units total) into the skin daily. 15 mL 0  . Insulin Syringe-Needle U-100 (B-D INS SYRINGE 2CC/29GX1/2") 29G X 1/2" 2 ML MISC Check Blood sugar TID and QHS 100 each 12  . ondansetron (ZOFRAN ODT) 8 MG disintegrating tablet Take 1 tablet (8 mg total) by mouth every 8 (eight) hours as needed for nausea or vomiting. 12 tablet 0  . pantoprazole (PROTONIX) 40 MG tablet Take 1 tablet (40 mg total) by mouth daily. 30 tablet 5   No facility-administered medications prior to visit.     ROS Review of Systems  Constitutional: Negative for activity change and appetite change.  HENT: Negative for sinus pressure and sore throat.   Eyes: Negative for visual disturbance.  Respiratory: Negative for cough, chest tightness and shortness of breath.   Cardiovascular: Negative for chest pain and leg swelling.  Gastrointestinal: Negative for abdominal distention, abdominal pain, constipation and diarrhea.  Endocrine: Negative.   Genitourinary: Negative for dysuria.  Musculoskeletal: Negative for joint swelling and myalgias.  Skin: Negative for rash.  Allergic/Immunologic: Negative.   Neurological: Negative for weakness, light-headedness and numbness.  Psychiatric/Behavioral: Negative for dysphoric mood and suicidal ideas.    Objective:  BP 102/69   Pulse 83   Temp 98.5 F (36.9 C) (Oral)   Ht '5\' 10"'  (1.778 m)   Wt 136 lb (61.7 kg)   SpO2 100%   BMI 19.51 kg/m   BP/Weight 08/02/2019 12/24/2018 9/83/3825  Systolic BP 053 - 92  Diastolic  BP 69 - 62  Wt. (Lbs) 136 131 123  BMI 19.51 18.8 18.16      Physical Exam Constitutional:      Appearance: He is well-developed.  Neck:     Vascular: No JVD.  Cardiovascular:     Rate and Rhythm: Normal rate.     Heart sounds: Normal heart sounds. No murmur.  Pulmonary:     Effort: Pulmonary effort is normal.     Breath sounds: Normal breath sounds. No wheezing or rales.  Chest:     Chest wall: No tenderness.  Abdominal:     General: Bowel sounds are normal. There is no distension.     Palpations: Abdomen is soft. There is no mass.  Tenderness: There is no abdominal tenderness.  Musculoskeletal:        General: Normal range of motion.     Right lower leg: No edema.     Left lower leg: No edema.  Neurological:     Mental Status: He is alert and oriented to person, place, and time.  Psychiatric:        Mood and Affect: Mood normal.     CMP Latest Ref Rng & Units 12/16/2018 11/26/2018 11/25/2018  Glucose 65 - 99 mg/dL 161(H) 135(H) 234(H)  BUN 6 - 24 mg/dL 9 <5(L) 9  Creatinine 0.76 - 1.27 mg/dL 0.91 0.68 0.96  Sodium 134 - 144 mmol/L 136 138 135  Potassium 3.5 - 5.2 mmol/L 4.2 3.2(L) 2.9(L)  Chloride 96 - 106 mmol/L 103 100 94(L)  CO2 20 - 29 mmol/L 19(L) 26 28  Calcium 8.7 - 10.2 mg/dL 9.5 8.2(L) 7.3(L)  Total Protein 6.0 - 8.5 g/dL 6.3 - -  Total Bilirubin 0.0 - 1.2 mg/dL 0.7 - -  Alkaline Phos 39 - 117 IU/L 153(H) - -  AST 0 - 40 IU/L 38 - -  ALT 0 - 44 IU/L 77(H) - -    Lipid Panel     Component Value Date/Time   CHOL 109 10/19/2018 1045   TRIG 39 10/19/2018 1045   HDL 69 10/19/2018 1045   CHOLHDL 1.6 10/19/2018 1045   CHOLHDL 2.3 12/19/2015 0947   VLDL 12 12/19/2015 0947   LDLCALC 32 10/19/2018 1045    CBC    Component Value Date/Time   WBC 4.5 12/16/2018 1444   WBC 5.4 11/26/2018 0255   RBC 4.14 12/16/2018 1444   RBC 4.42 11/26/2018 0255   HGB 12.4 (L) 12/16/2018 1444   HCT 36.8 (L) 12/16/2018 1444   PLT 299 12/16/2018 1444   MCV 89  12/16/2018 1444   MCH 30.0 12/16/2018 1444   MCH 30.1 11/26/2018 0255   MCHC 33.7 12/16/2018 1444   MCHC 34.6 11/26/2018 0255   RDW 13.0 12/16/2018 1444   LYMPHSABS 2.1 12/16/2018 1444   MONOABS 0.4 11/25/2018 0352   EOSABS 0.2 12/16/2018 1444   BASOSABS 0.0 12/16/2018 1444    Lab Results  Component Value Date   HGBA1C 13.0 (A) 08/02/2019    Assessment & Plan:   1. 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  Dispense: 60 capsule; Refill: 3  2. Type 1 diabetes mellitus with hyperglycemia (HCC) Uncontrolled with A1c of 13; goal is <7 Not compliant with a diabetic diet He has had labile sugars and is reluctant to adjust insulin dose Advised he will need to schedule appointment with Endocrine, [place]. I have offered to refer him to a local Endocrinologist which he might be more compliant with but he is not open to this. - insulin aspart (NOVOLOG) 100 UNIT/ML injection; INJECT 0-12 UNITS INTO THE SKIN THREE TIMES DAILY WITH MEALS AS PER SLIDING SCALE  Dispense: 10 mL; Refill: 2 - Insulin Glargine (BASAGLAR KWIKPEN) 100 UNIT/ML SOPN; Inject 0.15 mLs (15 Units total) into the skin daily.  Dispense: 15 mL; Refill: 2 - atorvastatin (LIPITOR) 20 MG tablet; Take 1 tablet (20 mg total) by mouth daily.  Dispense: 90 tablet; Refill: 0  3. Gastroesophageal reflux disease without esophagitis Controlled Advised he need to see GI  for follow up of polyps - provided GI number for him - pantoprazole (PROTONIX) 40 MG tablet; Take 1 tablet (40 mg total) by mouth daily.  Dispense: 30 tablet; Refill: 5   Return in about 3 months (around 10/31/2019) for Chronic medical conditions.   Charlott Rakes, MD, FAAFP. [facility] and [place], [place]     08/02/2019, 4:04 PM

## 2019-08-02 NOTE — Patient Instructions (Addendum)
Please see message from your gastroenterologist below : "[name],  I hope this letter finds you well.  We have been trying to reach you by phone but have been unable to leave a message.  Please contact our office so we can schedule you for an Office Visit with Dr. Rush Landmark to discuss a polypectomy follow-up from your colonoscopy in January 2020 with Dr. Havery Moros.   Our office number is [phone], #2. We look forward to hearing from you, Sincerely, Tia Alert, CMA"     Please call your endocrinologist at [facility] to set up an appointment: Tripuraneni Ruben Gottron, MD (Attending) [phone] (Work) [phone] (Fax) [place] [facility] [place], [place] [address] Endocrinology

## 2019-08-03 LAB — BASIC METABOLIC PANEL
BUN/Creatinine Ratio: 6 — ABNORMAL LOW (ref 9–20)
BUN: 4 mg/dL — ABNORMAL LOW (ref 6–24)
CO2: 26 mmol/L (ref 20–29)
Calcium: 9.3 mg/dL (ref 8.7–10.2)
Chloride: 101 mmol/L (ref 96–106)
Creatinine, Ser: 0.68 mg/dL — ABNORMAL LOW (ref 0.76–1.27)
GFR calc Af Amer: 135 mL/min/{1.73_m2} (ref >59)
GFR calc non Af Amer: 117 mL/min/{1.73_m2} (ref >59)
Glucose: 141 mg/dL — ABNORMAL HIGH (ref 65–99)
Potassium: 3.8 mmol/L (ref 3.5–5.2)
Sodium: 139 mmol/L (ref 134–144)

## 2019-08-10 ENCOUNTER — Telehealth: Payer: Self-pay

## 2019-08-10 NOTE — Telephone Encounter (Signed)
-----   Message from Charlott Rakes, MD sent at 08/03/2019  6:47 PM EST ----- Please inform him labs are stable

## 2019-08-10 NOTE — Telephone Encounter (Signed)
Patient name and DOB has been verified Patient was informed of lab results. Patient had no questions.  

## 2019-08-31 MED FILL — PANTOPRAZOLE SOD DR 40 MG T: 40 | 30 days supply | Qty: 30 | Fill #2

## 2019-08-31 MED FILL — BASAGLAR 100 UNIT/ML KWIKPE: 100 | 20 days supply | Qty: 3 | Fill #1

## 2019-09-23 MED FILL — BASAGLAR 100 UNIT/ML KWIKPE: 100 | 20 days supply | Qty: 3 | Fill #2

## 2019-10-14 MED FILL — BASAGLAR 100 UNIT/ML KWIKPE: 100 | 20 days supply | Qty: 3 | Fill #3

## 2019-11-04 MED FILL — BASAGLAR 100 UNIT/ML KWIKPE: 100 | 20 days supply | Qty: 3 | Fill #4

## 2019-11-04 MED FILL — PANTOPRAZOLE SOD DR 40 MG T: 40 | 90 days supply | Qty: 90 | Fill #3

## 2019-11-09 ENCOUNTER — Other Ambulatory Visit: Payer: Self-pay

## 2019-11-09 ENCOUNTER — Ambulatory Visit: Payer: Medicare Other | Attending: Family Medicine | Admitting: Family Medicine

## 2019-11-09 ENCOUNTER — Encounter: Payer: Self-pay | Admitting: Family Medicine

## 2019-11-09 VITALS — BP 120/79 | HR 93 | Ht 70.0 in | Wt 136.4 lb

## 2019-11-09 DIAGNOSIS — Z87891 Personal history of nicotine dependence: Secondary | ICD-10-CM | POA: Diagnosis not present

## 2019-11-09 DIAGNOSIS — K529 Noninfective gastroenteritis and colitis, unspecified: Secondary | ICD-10-CM | POA: Insufficient documentation

## 2019-11-09 DIAGNOSIS — Z9114 Patient's other noncompliance with medication regimen: Secondary | ICD-10-CM | POA: Insufficient documentation

## 2019-11-09 DIAGNOSIS — Z9119 Patient's noncompliance with other medical treatment and regimen: Secondary | ICD-10-CM | POA: Diagnosis not present

## 2019-11-09 DIAGNOSIS — Z794 Long term (current) use of insulin: Secondary | ICD-10-CM | POA: Diagnosis not present

## 2019-11-09 DIAGNOSIS — M8700 Idiopathic aseptic necrosis of unspecified bone: Secondary | ICD-10-CM

## 2019-11-09 DIAGNOSIS — E785 Hyperlipidemia, unspecified: Secondary | ICD-10-CM | POA: Diagnosis not present

## 2019-11-09 DIAGNOSIS — E1065 Type 1 diabetes mellitus with hyperglycemia: Secondary | ICD-10-CM | POA: Diagnosis not present

## 2019-11-09 DIAGNOSIS — Z880 Allergy status to penicillin: Secondary | ICD-10-CM | POA: Insufficient documentation

## 2019-11-09 DIAGNOSIS — Z79899 Other long term (current) drug therapy: Secondary | ICD-10-CM | POA: Insufficient documentation

## 2019-11-09 DIAGNOSIS — M1990 Unspecified osteoarthritis, unspecified site: Secondary | ICD-10-CM | POA: Diagnosis not present

## 2019-11-09 DIAGNOSIS — M879 Osteonecrosis, unspecified: Secondary | ICD-10-CM | POA: Insufficient documentation

## 2019-11-09 DIAGNOSIS — L409 Psoriasis, unspecified: Secondary | ICD-10-CM | POA: Insufficient documentation

## 2019-11-09 DIAGNOSIS — Z833 Family history of diabetes mellitus: Secondary | ICD-10-CM | POA: Diagnosis not present

## 2019-11-09 DIAGNOSIS — K219 Gastro-esophageal reflux disease without esophagitis: Secondary | ICD-10-CM | POA: Insufficient documentation

## 2019-11-09 LAB — POCT GLYCOSYLATED HEMOGLOBIN (HGB A1C): HbA1c, POC (controlled diabetic range): 12.7 % — AB (ref 0.0–7.0)

## 2019-11-09 LAB — GLUCOSE, POCT (MANUAL RESULT ENTRY): POC Glucose: 232 mg/dl — AB (ref 70–99)

## 2019-11-09 MED ORDER — PANTOPRAZOLE SODIUM 40 MG PO TBEC: 40.0000 mg | DELAYED_RELEASE_TABLET | Freq: Every day | ORAL | 3 refills | Status: DC

## 2019-11-09 MED ORDER — INSULIN ASPART 100 UNIT/ML ~~LOC~~ SOLN: SUBCUTANEOUS | 2 refills | Status: DC

## 2019-11-09 MED ORDER — ATORVASTATIN CALCIUM 20 MG PO TABS: 20.0000 mg | ORAL_TABLET | Freq: Every day | ORAL | 0 refills | Status: DC

## 2019-11-09 MED ORDER — BASAGLAR KWIKPEN 100 UNIT/ML ~~LOC~~ SOPN: 15.0000 [IU] | PEN_INJECTOR | Freq: Every day | SUBCUTANEOUS | 2 refills | Status: DC

## 2019-11-09 MED FILL — ATORVASTATIN CALCIUM 20 MG: 20 | 90 days supply | Qty: 90 | Fill #0

## 2019-11-09 MED FILL — NovoLOG 100 UNIT/ML SOLN: 100 | 27 days supply | Qty: 10 | Fill #0

## 2019-11-09 NOTE — Progress Notes (Signed)
Patient is requesting referrals to Endocrin and dermatology.

## 2019-11-09 NOTE — Patient Instructions (Signed)

## 2019-11-09 NOTE — Progress Notes (Signed)
Established Patient Office Visit  Subjective:  Patient ID: AZAR SOUTH, male    DOB: April 07, 1976  Age: 44 y.o. MRN: [id number]  CC:  Chief Complaint  Patient presents with  . Diabetes    HPI Jared Murray is a 44 year old male with a history of type 1 diabetes mellitus (A1c 12.7), noncompliance, avascular necrosis of both hips, GERD, and psoriasis who presents today for a follow up visit.  In the past he was followed by endocrine in [facility] and would like a new referral there.  At his last few visits he has listed numerous excuses as to why he has not followed up with them and has still not seen them due to insurance issues.  Currently on Basaglar 14 units and a Novolog sliding scale.  Rather than 0-12 units of Novolog, he is taking 1-3 units based on his meals because he feels that his blood sugar drops too quickly. States that his fasting sugars are 180-250 and post prandial sugars are 250-400 but he does occasionally have hypoglycemic episodes in the 60's every few weeks.  States that he feels most "normal" in the 200-300 range.  He does not follow a diabetic diet and eats whatever his family eats.  Due for a diabetic eye exam.  States that he stopped taking his atorvastatin several months ago because he though it was causing GI upset but he's not sure. His avascular necrosis and psoriasis symptoms are stable.  He is asking for a dermatology referral today because it's been several years since he's been seen.  Needs a GI follow up for polyps that were found in 2020.  Reflux is controlled on Protonix.  Denies nausea, vomiting, diarrhea, and constipation.  No acute complaints today.  Past Medical History:  Diagnosis Date  . Acute pericarditis 03/31/2016  . AKI (acute kidney injury) ([place]) 01/22/2015  . Arthritis   . Avascular necrosis ([place]) 02/12/2015  . Avascular necrosis of bone of hip ([place])   . Chest wall abscess 04/03/2015  . Chronic diarrhea of unknown origin 09/10/2017    . Cutaneous abscess of chest wall   . DKA (diabetic ketoacidoses) ([place])   . DKA, type 1, not at goal [facility]) 04/03/2015  . Eczema   . Elevated liver enzymes 11/27/2018  . GERD (gastroesophageal reflux disease)   . HLD (hyperlipidemia) 11/25/2018  . Hyperbilirubinemia   . Hyperglycemia due to type 1 diabetes mellitus ([place])   . Hyperkalemia   . Hypokalemia 03/17/2013  . Left hip pain 04/18/2014  . Loss of weight 03/17/2013  . Marijuana use 11/25/2018  . Osteoarthritis 12/24/2016  . Prolonged QT interval 11/27/2018   in setting of RBBB and hypokalemia  . Protein-calorie malnutrition, severe ([place]) 03/17/2013  . Psoriasis   . RBBB 12/23/2018   Chronic since at least 2014  . Sepsis ([place]) 01/22/2015  . Substance abuse ([place])   . Tobacco use disorder 09/13/2015  . Type 1 diabetes mellitus ([place])     Past Surgical History:  Procedure Laterality Date  . FINGER SURGERY Right    middle finger    Family History  Problem Relation Age of Onset  . Hypertension Father   . Diabetes Other        multiple  . Lupus Cousin   . Stroke Maternal Grandmother   . Stroke Paternal Grandmother   . Colon cancer Neg Hx   . Stomach cancer Neg Hx   . Rectal cancer Neg Hx   . Esophageal cancer Neg  Hx     Social History   Socioeconomic History  . Marital status: Married    Spouse name: Not on file  . Number of children: Not on file  . Years of education: Not on file  . Highest education level: Not on file  Occupational History  . Not on file  Tobacco Use  . Smoking status: Former Smoker    Packs/day: 0.25    Years: 10.00    Pack years: 2.50    Types: Cigarettes    Quit date: 11/26/2018    Years since quitting: 0.9  . Smokeless tobacco: Never Used  Substance and Sexual Activity  . Alcohol use: Yes    Alcohol/week: 1.0 - 2.0 standard drinks    Types: 1 - 2 Shots of liquor per week    Comment: last time tuesday  . Drug use: Yes    Types: Marijuana    Comment: smoked about 2 weeks ago  . Sexual  activity: Yes  Other Topics Concern  . Not on file  Social History Narrative   Lives in [place] - works as a Education officer, museum   Married   Social Determinants of Radio broadcast assistant Strain:   . Difficulty of Paying Living Expenses:   Food Insecurity:   . Worried About Charity fundraiser in the Last Year:   . Arboriculturist in the Last Year:   Transportation Needs:   . Film/video editor (Medical):   Marland Kitchen Lack of Transportation (Non-Medical):   Physical Activity:   . Days of Exercise per Week:   . Minutes of Exercise per Session:   Stress:   . Feeling of Stress :   Social Connections:   . Frequency of Communication with Friends and Family:   . Frequency of Social Gatherings with Friends and Family:   . Attends Religious Services:   . Active Member of Clubs or Organizations:   . Attends Archivist Meetings:   Marland Kitchen Marital Status:   Intimate Partner Violence:   . Fear of Current or Ex-Partner:   . Emotionally Abused:   Marland Kitchen Physically Abused:   . Sexually Abused:     Outpatient Medications Prior to Visit  Medication Sig Dispense Refill  . Accu-Chek Softclix Lancets lancets Use as instructed 3 times daily. Dx: Type 1 DM 120 each 5  . aspirin-sod bicarb-citric acid (ALKA-SELTZER) 325 MG TBEF tablet Take 325 mg by mouth every 6 (six) hours as needed (heartburn).    . Blood Glucose Monitoring Suppl (ACCU-CHEK AVIVA PLUS) w/Device KIT TEST 3 TIMES DAILY 1 kit 0  . clobetasol cream (TEMOVATE) 6.46 % Apply 1 application topically 2 (two) times daily as needed.    Marland Kitchen glucose blood (ACCU-CHEK AVIVA PLUS) test strip USE TO CHECK BLOOD SUGAR FOUR TIMES DAILY BEFORE MEALS AND AT BEDTIME 120 each 11  . Insulin Syringe-Needle U-100 (B-D INS SYRINGE 2CC/29GX1/2") 29G X 1/2" 2 ML MISC Check Blood sugar TID and QHS 100 each 12  . ondansetron (ZOFRAN ODT) 8 MG disintegrating tablet Take 1 tablet (8 mg total) by mouth every 8 (eight) hours as needed for nausea or vomiting. 12 tablet 0   . insulin aspart (NOVOLOG) 100 UNIT/ML injection INJECT 0-12 UNITS INTO THE SKIN THREE TIMES DAILY WITH MEALS AS PER SLIDING SCALE 10 mL 2  . Insulin Glargine (BASAGLAR KWIKPEN) 100 UNIT/ML SOPN Inject 0.15 mLs (15 Units total) into the skin daily. 15 mL 2  . pantoprazole (PROTONIX) 40 MG tablet  Take 1 tablet (40 mg total) by mouth daily. 30 tablet 5  . acetaminophen-codeine (TYLENOL #3) 300-30 MG tablet Take 1 tablet by mouth every 12 (twelve) hours as needed for moderate pain. Dx:P avascular necrosis of the hip (Patient not taking: Reported on 11/09/2019) 60 tablet 0  . gabapentin (NEURONTIN) 300 MG capsule Take 1 capsule (300 mg total) by mouth 2 (two) times daily. (Patient not taking: Reported on 11/09/2019) 60 capsule 3  . atorvastatin (LIPITOR) 20 MG tablet Take 1 tablet (20 mg total) by mouth daily. (Patient not taking: Reported on 11/09/2019) 90 tablet 0   No facility-administered medications prior to visit.    Allergies  Allergen Reactions  . Bee Venom   . Latex Itching and Rash  . Penicillins Other (See Comments)    Childhood allergy Has patient had a PCN reaction causing immediate rash, facial/tongue/throat swelling, SOB or lightheadedness with hypotension: NO Has patient had a PCN reaction causing severe rash involving mucus membranes or skin necrosis:NO Has patient had a PCN reaction that required hospitalization NO Has patient had a PCN reaction occurring within the last 10 years: NO If all of the above answers are "NO", then may proceed with Cephalosporin use.    ROS Review of Systems  Constitutional: Negative for fatigue, fever and unexpected weight change.  HENT: Negative for congestion, rhinorrhea, sinus pressure and sinus pain.   Eyes: Negative for visual disturbance.  Respiratory: Negative for cough, chest tightness and shortness of breath.   Cardiovascular: Negative for chest pain, palpitations and leg swelling.  Gastrointestinal: Negative for abdominal distention,  abdominal pain, constipation, diarrhea, nausea and vomiting.  Endocrine: Negative for polydipsia and polyuria.  Genitourinary: Negative for decreased urine volume, difficulty urinating and dysuria.  Musculoskeletal: Positive for arthralgias. Negative for myalgias.       Bilateral hips  Skin: Positive for color change.       Vitiligo on bilateral feet  Neurological: Negative for dizziness, tremors, weakness and numbness.  Hematological: Does not bruise/bleed easily.  Psychiatric/Behavioral: Negative for agitation and behavioral problems.      Objective:    Physical Exam  Constitutional: He is oriented to person, place, and time. He appears well-developed and well-nourished. No distress.  HENT:  Head: Normocephalic and atraumatic.  Eyes: Pupils are equal, round, and reactive to light. Conjunctivae and EOM are normal.  Cardiovascular: Normal rate, regular rhythm, normal heart sounds and intact distal pulses.  Pulmonary/Chest: Effort normal and breath sounds normal.  Abdominal: Soft. Bowel sounds are normal. He exhibits no distension. There is no abdominal tenderness.  Musculoskeletal:        General: No edema. Normal range of motion.  Neurological: He is alert and oriented to person, place, and time.  Skin: Skin is warm and dry.  Vitiligo bilateral feet  Psychiatric: He has a normal mood and affect. His behavior is normal.  Nursing note and vitals reviewed.   BP 120/79   Pulse 93   Ht _0  (1.778 m)   Wt 136 lb 6.4 oz (61.9 kg)   SpO2 100%   BMI 19.57 kg/m  Wt Readings from Last 3 Encounters:  11/09/19 136 lb 6.4 oz (61.9 kg)  08/02/19 136 lb (61.7 kg)  01/21/19 131 lb (59.4 kg)     Health Maintenance Due  Topic Date Due  . OPHTHALMOLOGY EXAM  Never done  . URINE MICROALBUMIN  12/03/2018  . FOOT EXAM  10/19/2019    There are no preventive care reminders to display for this  patient.  Lab Results  Component Value Date   TSH 0.700 02/22/2015   Lab Results    Component Value Date   WBC 4.5 12/16/2018   HGB 12.4 (L) 12/16/2018   HCT 36.8 (L) 12/16/2018   MCV 89 12/16/2018   PLT 299 12/16/2018   Lab Results  Component Value Date   NA 139 08/02/2019   K 3.8 08/02/2019   CO2 26 08/02/2019   GLUCOSE 141 (H) 08/02/2019   BUN 4 (L) 08/02/2019   CREATININE 0.68 (L) 08/02/2019   BILITOT 0.7 12/16/2018   ALKPHOS 153 (H) 12/16/2018   AST 38 12/16/2018   ALT 77 (H) 12/16/2018   PROT 6.3 12/16/2018   ALBUMIN 4.2 12/16/2018   CALCIUM 9.3 08/02/2019   ANIONGAP 12 11/26/2018   Lab Results  Component Value Date   CHOL 109 10/19/2018   Lab Results  Component Value Date   HDL 69 10/19/2018   Lab Results  Component Value Date   LDLCALC 32 10/19/2018   Lab Results  Component Value Date   TRIG 39 10/19/2018   Lab Results  Component Value Date   CHOLHDL 1.6 10/19/2018   Lab Results  Component Value Date   HGBA1C 12.7 (A) 11/09/2019      Assessment & Plan:   1. Type 1 diabetes mellitus with hyperglycemia (HCC) Uncontrolled with an A1c of 12.7; goal is < 7.0 Noncompliant with diabetic diet He has had labile blood sugars and is resistant to adjustment of either long acting insulin or his meal time coverage Continue current regimen He is not open to a local endocrine referral but states that he would like to go back to [facility] Counseled on Diabetic diet, my plate method, 308 minutes of moderate intensity exercise/week Keep blood sugar logs with fasting goals of 80-120 mg/dl, random of less than 180 and in the event of sugars less than 60 mg/dl or greater than 400 mg/dl please notify the clinic ASAP. It is recommended that you undergo annual eye exams and annual foot exams. - POCT glucose (manual entry) - POCT glycosylated hemoglobin (Hb A1C) - Ambulatory referral to Endocrinology - Microalbumin / creatinine urine ratio - atorvastatin (LIPITOR) 20 MG tablet; Take 1 tablet (20 mg total) by mouth daily.  Dispense: 90 tablet; Refill: 0 -  insulin aspart (NOVOLOG) 100 UNIT/ML injection; INJECT 0-12 UNITS INTO THE SKIN THREE TIMES DAILY WITH MEALS AS PER SLIDING SCALE  Dispense: 10 mL; Refill: 2 - Insulin Glargine (BASAGLAR KWIKPEN) 100 UNIT/ML; Inject 0.15 mLs (15 Units total) into the skin daily.  Dispense: 15 mL; Refill: 2  2. Avascular necrosis ([place]) Unable to undergo surgery due to hyperglycemia Pain is currently controlled  3. Chronic diarrhea Stable Referral to GI to follow up on colon polyps  4. Chronic diarrhea of unknown origin See #3 - Ambulatory referral to Gastroenterology  5. Psoriasis Stable - Ambulatory referral to Dermatology  6. Non compliance w medication regimen Emphasized importance of medication compliance to prevent complications Encouraged follow up with both Endocrine and GI to address health issues  7. Gastroesophageal reflux disease without esophagitis Stable Continue Protonix - pantoprazole (PROTONIX) 40 MG tablet; Take 1 tablet (40 mg total) by mouth daily.  Dispense: 30 tablet; Refill: 3   Meds ordered this encounter  Medications  . atorvastatin (LIPITOR) 20 MG tablet    Sig: Take 1 tablet (20 mg total) by mouth daily.    Dispense:  90 tablet    Refill:  0  . insulin aspart (  NOVOLOG) 100 UNIT/ML injection    Sig: INJECT 0-12 UNITS INTO THE SKIN THREE TIMES DAILY WITH MEALS AS PER SLIDING SCALE    Dispense:  10 mL    Refill:  2  . Insulin Glargine (BASAGLAR KWIKPEN) 100 UNIT/ML    Sig: Inject 0.15 mLs (15 Units total) into the skin daily.    Dispense:  15 mL    Refill:  2  . pantoprazole (PROTONIX) 40 MG tablet    Sig: Take 1 tablet (40 mg total) by mouth daily.    Dispense:  30 tablet    Refill:  3    Follow-up: Return in about 3 months (around 02/09/2020) for Chronic disease management.    Tomasita Morrow, RN   Evaluation and management procedures were performed by me with DNP Student in attendance, note written by DNP student under my supervision and collaboration. I  have reviewed the note and I agree with the management and plan.  Jared Murray unfortunately has had uncontrolled Type 1 Diabetes Mellitus with A1C >12 chronically due to non compliance with diabetic diet, self adjusting insulin regimen and non compliance with Endocrinology follow up. He has labile sugars and so resists changes in his insulin regimen. At every visit he resolves to do better only to return to his regular pattern. We have discussed implications of his actions and complications of his conditions. I have again strongly encouraged him to keep Endocrine appointment.  Charlott Rakes, MD, FAAFP. [facility] and [place] [place], [place]   11/09/2019, 7:07 PM

## 2019-11-25 DIAGNOSIS — L988 Other specified disorders of the skin and subcutaneous tissue: Secondary | ICD-10-CM | POA: Diagnosis not present

## 2019-12-02 MED FILL — BASAGLAR 100 UNIT/ML KWIKPE: 100 | 100 days supply | Qty: 15 | Fill #0

## 2019-12-30 ENCOUNTER — Other Ambulatory Visit (INDEPENDENT_AMBULATORY_CARE_PROVIDER_SITE_OTHER): Payer: Medicare Other

## 2019-12-30 ENCOUNTER — Ambulatory Visit (INDEPENDENT_AMBULATORY_CARE_PROVIDER_SITE_OTHER): Payer: Medicare Other | Admitting: Gastroenterology

## 2019-12-30 ENCOUNTER — Encounter: Payer: Self-pay | Admitting: Gastroenterology

## 2019-12-30 VITALS — BP 90/58 | HR 87 | Temp 98.9°F | Ht 70.0 in | Wt 130.0 lb

## 2019-12-30 DIAGNOSIS — Z86010 Personal history of colonic polyps: Secondary | ICD-10-CM

## 2019-12-30 DIAGNOSIS — K529 Noninfective gastroenteritis and colitis, unspecified: Secondary | ICD-10-CM | POA: Diagnosis not present

## 2019-12-30 DIAGNOSIS — R748 Abnormal levels of other serum enzymes: Secondary | ICD-10-CM | POA: Diagnosis not present

## 2019-12-30 LAB — HEPATIC FUNCTION PANEL
ALT: 28 U/L (ref 0–53)
AST: 28 U/L (ref 0–37)
Albumin: 4.2 g/dL (ref 3.5–5.2)
Alkaline Phosphatase: 119 U/L — ABNORMAL HIGH (ref 39–117)
Bilirubin, Direct: 0.2 mg/dL (ref 0.0–0.3)
Total Bilirubin: 0.8 mg/dL (ref 0.2–1.2)
Total Protein: 6.8 g/dL (ref 6.0–8.3)

## 2019-12-30 LAB — IBC + FERRITIN
Ferritin: 13.6 ng/mL — ABNORMAL LOW (ref 22.0–322.0)
Iron: 49 ug/dL (ref 42–165)
Saturation Ratios: 13.8 % — ABNORMAL LOW (ref 20.0–50.0)
Transferrin: 253 mg/dL (ref 212.0–360.0)

## 2019-12-30 LAB — IGA: IgA: 265 mg/dL (ref 68–378)

## 2019-12-30 LAB — VITAMIN B12: Vitamin B-12: 537 pg/mL (ref 211–911)

## 2019-12-30 LAB — TSH: TSH: 1.49 u[IU]/mL (ref 0.35–4.50)

## 2019-12-30 MED ORDER — COLESTIPOL HCL 1 G PO TABS: 1.0000 g | ORAL_TABLET | Freq: Two times a day (BID) | ORAL | 1 refills | Status: DC

## 2019-12-30 MED ORDER — SUTAB 1479-225-188 MG PO TABS: 1.0000 | ORAL_TABLET | Freq: Once | ORAL | 0 refills | Status: AC

## 2019-12-30 NOTE — Progress Notes (Signed)
HPI :  44 y/o male with a history of type I DM, GERD, chronic loose stools, history of colon polyps, here for follow-up visit.  He has had chronic loose stools for at least 4 to 5 years per his report today.  He has multiple loose stools per day, rare nocturnal symptoms.  Stools are typically loose and watery.  He denies any blood in his stools.  He denies any abdominal pains.  Weight has been stable.  He has tried Imodium before but does not like taking it, he thinks it may have helped slow down a little bit perhaps not significantly.  He has had a prior negative infectious work-up, pancreatic fecal elastase normal.  Prior CT scan showed diffuse borderline bowel wall thickening in 2018, follow-up colonoscopy with me in January 2020 showed no inflammatory changes in the colon.  He had 2 small adenomas removed and then he had atypical mucosa along the IC valve, I was not sure if this was prolapsed small bowel tissue versus adenoma at the time.  Biopsies showed tubular adenoma.  He was referred to Dr. Allison Quarry for repeat colonoscopy and resection of this lesion if amenable.  The patient no showed for that procedure.  He was contacted again over the summertime to follow-up and schedule but he failed to do so.  He is here today to have that evaluated and reassess the diarrhea symptoms.  He is currently not taking much of anything for his bowels at present time.  He was given a trial of Viberzi when I last saw him and he states he did not like it and stopped it.  I have not heard back from him since that time.  He is not using NSAIDs routinely.  He has type 1 diabetes and his A1c is 12.7.  Of note since have last seen him on his last lab draw in April 2020 he had an elevated alk phos and ALT as outlined below.  Denies history of liver disease.  Prior workup: Pancreatic fecal elastase on 09/08/17 was > 500, fecal fat normal C diff PCR negative 08/02/17  CT abdomen 08/01/2017 - boderline diffuse bowel  wall thickening  Colonoscopy 09/08/18 - - The perianal and digital rectal examinations were normal. - The terminal ileum appeared normal. - The mucosa at the ileocecal valve was slightly altered, I suspect normal ileal tissue at the valve that was prolapsed however biopsies were taken with a cold forceps for histology to ensure no adenomatous change. - A 3 mm polyp was found in the transverse colon. The polyp was sessile. The polyp was removed with a cold snare. Resection and retrieval were complete. - A 4 mm polyp was found in the rectum. The polyp was sessile. The polyp was removed with a cold snare. Resection and retrieval were complete. - Multiple small-mouthed diverticula were found in the transverse colon and left colon. - The prep was fair, time spent lavaging the colon to achieve mostly adequate views, some areas it is possible small or flat polyps could have been missed. The left colon was quite spastic and did not retain air well. The exam was otherwise without abnormality. - Biopsies for histology were taken with a cold forceps from the right colon, left colon and transverse colon for evaluation of microscopic colitis.  Diagnosis 1. Surgical [P], random colon sites - BENIGN COLONIC MUCOSA. - NO SIGNIFICANT INFLAMMATION OR OTHER ABNORMALITIES IDENTIFIED. 2. Surgical [P], rectal, transverse, polyp (2) - TUBULAR ADENOMA(S). - HIGH GRADE DYSPLASIA IS  NOT IDENTIFIED. 3. Surgical [P], ileocecal valve biopsies - TUBULAR ADENOMA(S). - HIGH GRADE DYSPLASIA IS NOT IDENTIFIED.   Echo 01/18/19 - EF > 65%     Past Medical History:  Diagnosis Date  . Acute pericarditis 03/31/2016  . AKI (acute kidney injury) ([place]) 01/22/2015  . Arthritis   . Avascular necrosis ([place]) 02/12/2015  . Avascular necrosis of bone of hip ([place])   . Chest wall abscess 04/03/2015  . Chronic diarrhea of unknown origin 09/10/2017  . Cutaneous abscess of chest wall   . DKA (diabetic ketoacidoses) ([place])   . DKA,  type 1, not at goal [facility]) 04/03/2015  . Eczema   . Elevated liver enzymes 11/27/2018  . GERD (gastroesophageal reflux disease)   . HLD (hyperlipidemia) 11/25/2018  . Hyperbilirubinemia   . Hyperglycemia due to type 1 diabetes mellitus ([place])   . Hyperkalemia   . Hypokalemia 03/17/2013  . Left hip pain 04/18/2014  . Loss of weight 03/17/2013  . Marijuana use 11/25/2018  . Osteoarthritis 12/24/2016  . Prolonged QT interval 11/27/2018   in setting of RBBB and hypokalemia  . Protein-calorie malnutrition, severe ([place]) 03/17/2013  . Psoriasis   . RBBB 12/23/2018   Chronic since at least 2014  . Sepsis ([place]) 01/22/2015  . Substance abuse ([place])   . Tobacco use disorder 09/13/2015  . Type 1 diabetes mellitus ([place])      Past Surgical History:  Procedure Laterality Date  . FINGER SURGERY Right    middle finger   Family History  Problem Relation Age of Onset  . Hypertension Father   . Diabetes Other        multiple  . Lupus Cousin   . Stroke Maternal Grandmother   . Stroke Paternal Grandmother   . Colon cancer Neg Hx   . Stomach cancer Neg Hx   . Rectal cancer Neg Hx   . Esophageal cancer Neg Hx    Social History   Tobacco Use  . Smoking status: Former Smoker    Packs/day: 0.25    Years: 10.00    Pack years: 2.50    Types: Cigarettes    Quit date: 11/26/2018    Years since quitting: 1.0  . Smokeless tobacco: Never Used  Substance Use Topics  . Alcohol use: Yes    Alcohol/week: 1.0 - 2.0 standard drinks    Types: 1 - 2 Shots of liquor per week    Comment: last time tuesday  . Drug use: Yes    Types: Marijuana    Comment: smoked about 2 weeks ago   Current Outpatient Medications  Medication Sig Dispense Refill  . Accu-Chek Softclix Lancets lancets Use as instructed 3 times daily. Dx: Type 1 DM 120 each 5  . acetaminophen-codeine (TYLENOL #3) 300-30 MG tablet Take 1 tablet by mouth every 12 (twelve) hours as needed for moderate pain. Dx:P avascular necrosis of the hip 60 tablet 0  .  aspirin-sod bicarb-citric acid (ALKA-SELTZER) 325 MG TBEF tablet Take 325 mg by mouth every 6 (six) hours as needed (heartburn).    . Blood Glucose Monitoring Suppl (ACCU-CHEK AVIVA PLUS) w/Device KIT TEST 3 TIMES DAILY 1 kit 0  . clobetasol cream (TEMOVATE) 1.69 % Apply 1 application topically 2 (two) times daily as needed.    Marland Kitchen glucose blood (ACCU-CHEK AVIVA PLUS) test strip USE TO CHECK BLOOD SUGAR FOUR TIMES DAILY BEFORE MEALS AND AT BEDTIME 120 each 11  . insulin aspart (NOVOLOG) 100 UNIT/ML injection INJECT 0-12 UNITS INTO THE SKIN  THREE TIMES DAILY WITH MEALS AS PER SLIDING SCALE 10 mL 2  . Insulin Glargine (BASAGLAR KWIKPEN) 100 UNIT/ML Inject 0.15 mLs (15 Units total) into the skin daily. 15 mL 2  . Insulin Syringe-Needle U-100 (B-D INS SYRINGE 2CC/29GX1/2") 29G X 1/2" 2 ML MISC Check Blood sugar TID and QHS 100 each 12  . pantoprazole (PROTONIX) 40 MG tablet Take 1 tablet (40 mg total) by mouth daily. 30 tablet 3   No current facility-administered medications for this visit.   Allergies  Allergen Reactions  . Bee Venom   . Latex Itching and Rash  . Penicillins Other (See Comments)    Childhood allergy Has patient had a PCN reaction causing immediate rash, facial/tongue/throat swelling, SOB or lightheadedness with hypotension: NO Has patient had a PCN reaction causing severe rash involving mucus membranes or skin necrosis:NO Has patient had a PCN reaction that required hospitalization NO Has patient had a PCN reaction occurring within the last 10 years: NO If all of the above answers are "NO", then may proceed with Cephalosporin use.     Review of Systems: All systems reviewed and negative except where noted in HPI.   Lab Results  Component Value Date   WBC 4.5 12/16/2018   HGB 12.4 (L) 12/16/2018   HCT 36.8 (L) 12/16/2018   MCV 89 12/16/2018   PLT 299 12/16/2018    Lab Results  Component Value Date   CREATININE 0.68 (L) 08/02/2019   BUN 4 (L) 08/02/2019   NA 139  08/02/2019   K 3.8 08/02/2019   CL 101 08/02/2019   CO2 26 08/02/2019    Lab Results  Component Value Date   ALT 77 (H) 12/16/2018   AST 38 12/16/2018   ALKPHOS 153 (H) 12/16/2018   BILITOT 0.7 12/16/2018     Physical Exam: BP (!) 90/58   Pulse 87   Temp 98.9 F (37.2 C)   Ht '5\' 10"'  (1.778 m)   Wt 130 lb (59 kg)   BMI 18.65 kg/m  Constitutional: Pleasant,male in no acute distress. Abdominal: Soft, nondistended, nontender.  There are no masses palpable. Extremities: no edema Lymphadenopathy: No cervical adenopathy noted. Neurological: Alert and oriented to person place and time. Skin: Skin is warm and dry. No rashes noted. Psychiatric: Normal mood and affect. Behavior is normal.   ASSESSMENT AND PLAN: 44 year old male here for reassessment of the following:  History of colon polyps - 2 small adenomas removed on his last exam, he also had abnormal tissue on the IC valve, I could not tell if this was polypoid versus prolapsed ileal tissue at the time.  Biopsies showed tubular adenoma.  This is a difficult location to remove this polyp on the lip of the valve.  I had previously referred him to Dr. Allison Quarry for polypectomy, the patient unfortunately was not able to make that visit and we had attempted contacting him a few times since then without success.  This is my first time seeing him since then and I outlined my concerns about the lesion on his last exam and recommend a repeat colonoscopy at the hospital to have this removed if possible.  I discussed his case with Dr. Allison Quarry of advanced endoscopy who accepted his case.  We will plan for regular prep plus additional Gatorade MiraLAX as needed to ensure adequate visualization.  He will have a telehealth visit schedule with Dr. Allison Quarry prior to his colonoscopy as he would like to meet him pre-procedure.  I discussed risk benefits of  colonoscopy and anesthesia with him and he want to proceed.  This is tentatively scheduled  for July.  Chronic diarrhea - unclear if this is functional versus something more concerning.  Colonoscopy with biopsies were normal.  Prior stool study work-up has been negative.  I will read check serologies to include celiac labs, although in light of his ethnicity this is unlikely.  We will recheck a CBC along with iron studies and B12 as well as TSH given he had a mild anemia noted on his last labs a year ago.  I discussed other options with him, he did not like Viberzi or Imodium.  I will treat him empirically with Colestid to treat possible bile salt diarrhea see if that helps in the interim.  He agreed.  If symptoms persist despite this regimen may consider other additional work-up to include more rare neuroendocrine etiologies.  He agreed  Abnormal liver enzymes - noted on lab draw about a year ago without follow-up, will repeat LFTs and work-up if this persists  [place] Cellar, MD [facility]

## 2019-12-30 NOTE — Patient Instructions (Addendum)
If you are age 44 or older, your body mass index should be between 23-30. Your Body mass index is 18.65 kg/m. If this is out of the aforementioned range listed, please consider follow up with your Primary Care Provider.  If you are age 44 or younger, your body mass index should be between 19-25. Your Body mass index is 18.65 kg/m. If this is out of the aformentioned range listed, please consider follow up with your Primary Care Provider.   You have been scheduled for a colonoscopy. Please follow written instructions given to you at your visit today.  Please pick up your prep supplies at the pharmacy within the next 1-3 days. If you use inhalers (even only as needed), please bring them with you on the day of your procedure.  Mix 32 ounces of room temperature Gatorade (no red or purple) with 1/2 of a bottle of 238 gram bottle of Miralax in the morning and refrigerate.  Drink one 8 ounce glass every 15 minutes if you are not completely cleaned out the morning of your procedure.    Please go to the lab in the basement of our building to have lab work done as you leave today. Hit "B" for basement when you get on the elevator.  When the doors open the lab is on your left.  We will call you with the results. Thank you.   We have sent the following medications to your pharmacy for you to pick up at your convenience: Colestid 1 g: Take twice a day   Thank you for entrusting me with your care and for choosing [employer], Dr. Henagar Cellar

## 2020-01-02 LAB — CBC WITH DIFFERENTIAL/PLATELET
Absolute Monocytes: 340 cells/uL (ref 200–950)
Basophils Absolute: 50 cells/uL (ref 0–200)
Basophils Relative: 1 %
Eosinophils Absolute: 170 cells/uL (ref 15–500)
Eosinophils Relative: 3.4 %
HCT: 34.6 % — ABNORMAL LOW (ref 38.5–50.0)
Hemoglobin: 11.6 g/dL — ABNORMAL LOW (ref 13.2–17.1)
Lymphs Abs: 2095 cells/uL (ref 850–3900)
MCH: 29.1 pg (ref 27.0–33.0)
MCHC: 33.5 g/dL (ref 32.0–36.0)
MCV: 86.7 fL (ref 80.0–100.0)
MPV: 10.1 fL (ref 7.5–12.5)
Monocytes Relative: 6.8 %
Neutro Abs: 2345 cells/uL (ref 1500–7800)
Neutrophils Relative %: 46.9 %
Platelets: 376 10*3/uL (ref 140–400)
RBC: 3.99 10*6/uL — ABNORMAL LOW (ref 4.20–5.80)
RDW: 13.1 % (ref 11.0–15.0)
Total Lymphocyte: 41.9 %
WBC: 5 10*3/uL (ref 3.8–10.8)

## 2020-01-02 LAB — TISSUE TRANSGLUTAMINASE, IGA: (tTG) Ab, IgA: 1 U/mL

## 2020-01-02 MED FILL — COLESTIPOL HCL 1 GM TABLET: 1 | 30 days supply | Qty: 60 | Fill #0

## 2020-01-02 MED FILL — SUTAB 1479-225-188 MG TABS: 1479-225-18 | 1 days supply | Qty: 24 | Fill #0

## 2020-01-24 ENCOUNTER — Other Ambulatory Visit: Payer: Self-pay

## 2020-01-24 ENCOUNTER — Ambulatory Visit (AMBULATORY_SURGERY_CENTER): Payer: Self-pay

## 2020-01-24 VITALS — Ht 70.0 in | Wt 127.6 lb

## 2020-01-24 DIAGNOSIS — K529 Noninfective gastroenteritis and colitis, unspecified: Secondary | ICD-10-CM

## 2020-01-24 DIAGNOSIS — Z1211 Encounter for screening for malignant neoplasm of colon: Secondary | ICD-10-CM

## 2020-01-24 DIAGNOSIS — D509 Iron deficiency anemia, unspecified: Secondary | ICD-10-CM

## 2020-01-24 DIAGNOSIS — Z86010 Personal history of colonic polyps: Secondary | ICD-10-CM

## 2020-01-24 DIAGNOSIS — Z1159 Encounter for screening for other viral diseases: Secondary | ICD-10-CM | POA: Diagnosis not present

## 2020-01-24 NOTE — Progress Notes (Signed)
No allergies to soy or egg Pt is not on blood thinners or diet pills Denies issues with sedation/intubation Denies atrial flutter/fib Denies constipation   Pt is aware of Covid safety and care partner requirements.      

## 2020-01-27 ENCOUNTER — Ambulatory Visit (INDEPENDENT_AMBULATORY_CARE_PROVIDER_SITE_OTHER): Payer: Medicare Other

## 2020-01-27 DIAGNOSIS — L988 Other specified disorders of the skin and subcutaneous tissue: Secondary | ICD-10-CM | POA: Diagnosis not present

## 2020-01-27 DIAGNOSIS — Z1159 Encounter for screening for other viral diseases: Secondary | ICD-10-CM

## 2020-01-27 LAB — SARS CORONAVIRUS 2 (TAT 6-24 HRS): SARS Coronavirus 2: NEGATIVE

## 2020-01-31 ENCOUNTER — Ambulatory Visit (AMBULATORY_SURGERY_CENTER): Payer: Medicare Other | Admitting: Gastroenterology

## 2020-01-31 ENCOUNTER — Telehealth: Payer: Self-pay | Admitting: Gastroenterology

## 2020-01-31 ENCOUNTER — Encounter: Payer: Self-pay | Admitting: Gastroenterology

## 2020-01-31 ENCOUNTER — Other Ambulatory Visit: Payer: Self-pay

## 2020-01-31 ENCOUNTER — Telehealth: Payer: Self-pay

## 2020-01-31 ENCOUNTER — Other Ambulatory Visit: Payer: Self-pay | Admitting: Gastroenterology

## 2020-01-31 VITALS — BP 149/99 | HR 67 | Temp 96.8°F | Resp 16 | Ht 70.0 in | Wt 127.6 lb

## 2020-01-31 DIAGNOSIS — D509 Iron deficiency anemia, unspecified: Secondary | ICD-10-CM

## 2020-01-31 DIAGNOSIS — K529 Noninfective gastroenteritis and colitis, unspecified: Secondary | ICD-10-CM

## 2020-01-31 DIAGNOSIS — Z538 Procedure and treatment not carried out for other reasons: Secondary | ICD-10-CM | POA: Diagnosis not present

## 2020-01-31 DIAGNOSIS — Z86010 Personal history of colonic polyps: Secondary | ICD-10-CM

## 2020-01-31 MED ORDER — METOCLOPRAMIDE HCL 5 MG PO TABS
5.0000 mg | ORAL_TABLET | Freq: Three times a day (TID) | ORAL | 0 refills | Status: DC | PRN
Start: 2020-01-31 — End: 2020-01-31

## 2020-01-31 MED ORDER — SODIUM CHLORIDE 0.9 % IV SOLN: 500.0000 mL | Freq: Once | INTRAVENOUS | Status: DC

## 2020-01-31 MED ORDER — METOCLOPRAMIDE HCL 5 MG PO TABS
5.0000 mg | ORAL_TABLET | Freq: Three times a day (TID) | ORAL | 0 refills | Status: DC | PRN
Start: 2020-01-31 — End: 2020-05-07

## 2020-01-31 NOTE — Patient Instructions (Signed)
Dr. Doyne Keel office will contact you to re-schedule your procedures and also to prescribe new medicine for your stomach.  YOU HAD AN ENDOSCOPIC PROCEDURE TODAY AT [facility]:   Refer to the procedure report that was given to you for any specific questions about what was found during the examination.  If the procedure report does not answer your questions, please call your gastroenterologist to clarify.  If you requested that your care partner not be given the details of your procedure findings, then the procedure report has been included in a sealed envelope for you to review at your convenience later.  YOU SHOULD EXPECT: Some feelings of bloating in the abdomen. Passage of more gas than usual.  Walking can help get rid of the air that was put into your GI tract during the procedure and reduce the bloating. If you had a lower endoscopy (such as a colonoscopy or flexible sigmoidoscopy) you may notice spotting of blood in your stool or on the toilet paper. If you underwent a bowel prep for your procedure, you may not have a normal bowel movement for a few days.  Please Note:  You might notice some irritation and congestion in your nose or some drainage.  This is from the oxygen used during your procedure.  There is no need for concern and it should clear up in a day or so.  SYMPTOMS TO REPORT IMMEDIATELY:   Following upper endoscopy (EGD)  Vomiting of blood or coffee ground material  New chest pain or pain under the shoulder blades  Painful or persistently difficult swallowing  New shortness of breath  Fever of 100F or higher  Black, tarry-looking stools  For urgent or emergent issues, a gastroenterologist can be reached at any hour by calling [phone]. Do not use MyChart messaging for urgent concerns.    DIET:  We do recommend a small meal at first, but then you may proceed to your regular diet.  Drink plenty of fluids but you should avoid alcoholic beverages for 24  hours.  ACTIVITY:  You should plan to take it easy for the rest of today and you should NOT DRIVE or use heavy machinery until tomorrow (because of the sedation medicines used during the test).    FOLLOW UP: Our staff will call the number listed on your records 48-72 hours following your procedure to check on you and address any questions or concerns that you may have regarding the information given to you following your procedure. If we do not reach you, we will leave a message.  We will attempt to reach you two times.  During this call, we will ask if you have developed any symptoms of COVID 19. If you develop any symptoms (ie: fever, flu-like symptoms, shortness of breath, cough etc.) before then, please call [phone].  If you test positive for Covid 19 in the 2 weeks post procedure, please call and report this information to [place].    If any biopsies were taken you will be contacted by phone or by letter within the next 1-3 weeks.  Please call us at [phone] if you have not heard about the biopsies in 3 weeks.    SIGNATURES/CONFIDENTIALITY: You and/or your care partner have signed paperwork which will be entered into your electronic medical record.  These signatures attest to the fact that that the information above on your After Visit Summary has been reviewed and is understood.  Full responsibility of the confidentiality of this discharge information  lies with you and/or your care-partner.

## 2020-01-31 NOTE — Op Note (Signed)
[facility] Patient Name: Jared Murray Procedure Date: 01/31/2020 9:29 AM MRN: [id number] Endoscopist: Remo Lipps P. Havery Moros , MD Age: 44 Referring MD:  Date of Birth: 1975/09/05 Gender: Male Account #: [id number] Procedure:                Upper GI endoscopy Indications:              Iron deficiency anemia Medicines:                Monitored Anesthesia Care Procedure:                Pre-Anesthesia Assessment:                           - Prior to the procedure, a History and Physical                            was performed, and patient medications and                            allergies were reviewed. The patient's tolerance of                            previous anesthesia was also reviewed. The risks                            and benefits of the procedure and the sedation                            options and risks were discussed with the patient.                            All questions were answered, and informed consent                            was obtained. Prior Anticoagulants: The patient has                            taken no previous anticoagulant or antiplatelet                            agents. ASA Grade Assessment: III - A patient with                            severe systemic disease. After reviewing the risks                            and benefits, the patient was deemed in                            satisfactory condition to undergo the procedure.                           After obtaining informed consent, the endoscope was  passed under direct vision. Throughout the                            procedure, the patient's blood pressure, pulse, and                            oxygen saturations were monitored continuously. The                            Endoscope was introduced through the mouth, and                            advanced to the antrum of the stomach. The upper GI                            endoscopy was  accomplished without difficulty. The                            patient tolerated the procedure well. Scope In: Scope Out: Findings:                 The examined esophagus was normal.                           A large amount of food was found in the entire                            examined stomach, prohibiting views of the stomach.                            Given this finding and risk for aspiration, the                            procedure was aborted. Planned colonoscopy also                            aborted due to residual food in the stomach. Complications:            No immediate complications. Estimated blood loss:                            None. Estimated Blood Loss:     Estimated blood loss: none. Impression:               - Normal esophagus.                           - A large amount of residual food in the stomach,                            prohibited views of the stomach and procedure                            aborted due to risk for aspiration. Recommendation:           - Patient  has a contact number available for                            emergencies. The signs and symptoms of potential                            delayed complications were discussed with the                            patient. Return to normal activities tomorrow.                            Written discharge instructions were provided to the                            patient.                           - Resume previous diet.                           - Continue present medications.                           - Unfortunately given findings today, EGD could not                            be completed and will not be able to proceed with                            colonoscopy today given aspiration risk. Will                            discuss options with patient regarding rescheduling                            these procedures. Remo Lipps P. Elisabeth Strom, MD 01/31/2020 9:50:43 AM This report has been  signed electronically.

## 2020-01-31 NOTE — Telephone Encounter (Signed)
New instructions for ECL on 02-08-20 with Cirigliano and consent form left upfront with April for pt to pick up/sign on 02-06-20.

## 2020-01-31 NOTE — Telephone Encounter (Signed)
Spoke to patient this morning to schedule an EGD/Colonoscopy for next week as today's procedure with Dr Havery Moros had to abort due to aspiration precautions. His procedure is scheduled for 02/08/20 at 3 pm with Dr Bryan Lemma. Patient was instructed to be on clear fluids the entire day before his procedure. He will take Reglan 5 mg every 8 hours starting before first bowel prep. All other prep instructions were discussed and patient voiced understanding. He will come by the [place] office on 02/06/20 after his 12 pm Covid screening to sign his pre-Procedure form and pick up written procedure instructions.

## 2020-01-31 NOTE — Progress Notes (Signed)
robinol antisialogogue 

## 2020-01-31 NOTE — Progress Notes (Signed)
VS-CW  Pt's states no medical or surgical changes since previsit or office visit.  

## 2020-01-31 NOTE — Telephone Encounter (Signed)
-----   Message from Angie Fava, LPN sent at 09/17/8655  1:04 PM EDT ----- Regarding: instructions Pre-Procedure form Jan, This is an [facility] patient that he started an EGD/Colo on today and had to stop the procedure due to aspiration precautions. Dr C is going  to do him on 02/08/20. Patient is coming by your office on 02/06/20 after his 12 pm Covid screening to sign the Pre-Procedure form and pick up instructions. Can you please print off his instructions in the letter section and take the forms to the front desk. You do not need to go over anything with him. He did not want to come to the [facility] office to do this as it is out of his way.  Thanks, [employer]

## 2020-01-31 NOTE — Progress Notes (Signed)
A and O x3. Report to RN. Tolerated MAC anesthesia well.Gums unchanged after procedure.

## 2020-02-02 ENCOUNTER — Telehealth: Payer: Self-pay

## 2020-02-02 ENCOUNTER — Telehealth: Payer: Self-pay | Admitting: *Deleted

## 2020-02-02 NOTE — Telephone Encounter (Signed)
LVM

## 2020-02-02 NOTE — Telephone Encounter (Signed)
°  Follow up Call-  Call back number 01/31/2020 09/08/2018  Post procedure Call Back phone  # [phone]  Permission to leave phone message Yes Yes  Some recent data might be hidden     Patient questions:  Message left to call us if necessary.

## 2020-02-08 ENCOUNTER — Encounter: Payer: Medicare Other | Admitting: Gastroenterology

## 2020-02-13 ENCOUNTER — Ambulatory Visit: Payer: Medicare Other | Admitting: Family Medicine

## 2020-02-14 ENCOUNTER — Encounter: Payer: Self-pay | Admitting: Family Medicine

## 2020-02-14 ENCOUNTER — Other Ambulatory Visit: Payer: Self-pay

## 2020-02-14 ENCOUNTER — Other Ambulatory Visit: Payer: Self-pay | Admitting: Family Medicine

## 2020-02-14 ENCOUNTER — Ambulatory Visit: Payer: Medicare Other | Attending: Family Medicine | Admitting: Family Medicine

## 2020-02-14 VITALS — BP 101/66 | HR 90 | Ht 70.0 in | Wt 129.0 lb

## 2020-02-14 DIAGNOSIS — Z823 Family history of stroke: Secondary | ICD-10-CM | POA: Diagnosis not present

## 2020-02-14 DIAGNOSIS — Z91030 Bee allergy status: Secondary | ICD-10-CM | POA: Insufficient documentation

## 2020-02-14 DIAGNOSIS — Z91040 Latex allergy status: Secondary | ICD-10-CM | POA: Insufficient documentation

## 2020-02-14 DIAGNOSIS — Z880 Allergy status to penicillin: Secondary | ICD-10-CM | POA: Diagnosis not present

## 2020-02-14 DIAGNOSIS — E785 Hyperlipidemia, unspecified: Secondary | ICD-10-CM | POA: Insufficient documentation

## 2020-02-14 DIAGNOSIS — Z794 Long term (current) use of insulin: Secondary | ICD-10-CM | POA: Insufficient documentation

## 2020-02-14 DIAGNOSIS — Z79899 Other long term (current) drug therapy: Secondary | ICD-10-CM | POA: Diagnosis not present

## 2020-02-14 DIAGNOSIS — Z8249 Family history of ischemic heart disease and other diseases of the circulatory system: Secondary | ICD-10-CM | POA: Diagnosis not present

## 2020-02-14 DIAGNOSIS — E10649 Type 1 diabetes mellitus with hypoglycemia without coma: Secondary | ICD-10-CM | POA: Diagnosis not present

## 2020-02-14 DIAGNOSIS — M879 Osteonecrosis, unspecified: Secondary | ICD-10-CM | POA: Diagnosis not present

## 2020-02-14 DIAGNOSIS — L409 Psoriasis, unspecified: Secondary | ICD-10-CM | POA: Diagnosis not present

## 2020-02-14 DIAGNOSIS — M1990 Unspecified osteoarthritis, unspecified site: Secondary | ICD-10-CM | POA: Insufficient documentation

## 2020-02-14 DIAGNOSIS — E109 Type 1 diabetes mellitus without complications: Secondary | ICD-10-CM | POA: Diagnosis present

## 2020-02-14 DIAGNOSIS — Z833 Family history of diabetes mellitus: Secondary | ICD-10-CM | POA: Insufficient documentation

## 2020-02-14 DIAGNOSIS — K219 Gastro-esophageal reflux disease without esophagitis: Secondary | ICD-10-CM

## 2020-02-14 LAB — GLUCOSE, POCT (MANUAL RESULT ENTRY)
POC Glucose: 107 mg/dl — AB (ref 70–99)
POC Glucose: 68 mg/dl — AB (ref 70–99)

## 2020-02-14 LAB — POCT GLYCOSYLATED HEMOGLOBIN (HGB A1C): HbA1c, POC (controlled diabetic range): 12.1 % — AB (ref 0.0–7.0)

## 2020-02-14 MED ORDER — ATORVASTATIN CALCIUM 20 MG PO TABS: 20.0000 mg | ORAL_TABLET | Freq: Every day | ORAL | 3 refills | Status: DC

## 2020-02-14 MED ORDER — PANTOPRAZOLE SODIUM 40 MG PO TBEC: 40.0000 mg | DELAYED_RELEASE_TABLET | Freq: Every day | ORAL | 3 refills | Status: DC

## 2020-02-14 MED ORDER — BASAGLAR KWIKPEN 100 UNIT/ML ~~LOC~~ SOPN: 12.0000 [IU] | PEN_INJECTOR | Freq: Every day | SUBCUTANEOUS | 1 refills | Status: DC

## 2020-02-14 MED FILL — PANTOPRAZOLE SOD DR 40 MG T: 40 | 90 days supply | Qty: 90 | Fill #0

## 2020-02-14 MED FILL — ATORVASTATIN CALCIUM 20 MG: 20 | 90 days supply | Qty: 90 | Fill #0

## 2020-02-14 NOTE — Progress Notes (Signed)
Subjective:  Patient ID: Jared Murray, male    DOB: [birth year]  Age: 44 y.o. MRN: [id number]  CC: Diabetes   HPI [name]  is a 44 year old male with a history of type 1 diabetes mellitus (A1c 12.1), noncompliance, avascular necrosis of both hips, GERD, and psoriasis who presents today for a follow up visit.  In the past he was followed by endocrine in [facility] but states he had problem with his insurance and could not have a follow-up. His blood sugar fluctuates between 100 and 350s and he has had to adjust his insulin regimen on his own. CBG is 68 in the Clinic and he was provided with graham crackers.  Repeat blood sugar returned at 53 and glucose gel administered.  He had a smoothie today. He dropped his Basaglar from 14 to 12 units as he states his sugar drops quickly.He has not been compliant with his Novolog regimen due to his intermittently low blood sugars. Appointment with [facility] comes up on 03/19/20. He is also noncompliant with a diabetic diet.  Scheduled to undergo a Colonoscopy by GI for evaluation of colonic polyps and chronic diarrhea. For his avascular necrosis he is unable to undergo surgery due to uncontrolled diabetes mellitus. His depression is controlled and he is no longer on an antidepressant. He attributes control of depression to the fact that he and his wife are in good terms.  Past Medical History:  Diagnosis Date  . Acute pericarditis 03/31/2016  . AKI (acute kidney injury) ([place]) 01/22/2015  . Allergy   . Anxiety    per pt  . Arthritis   . Avascular necrosis ([place]) 02/12/2015  . Avascular necrosis of bone of hip ([place])   . Chest wall abscess 04/03/2015  . Chronic diarrhea of unknown origin 09/10/2017  . Cutaneous abscess of chest wall   . Depression    per pt  . DKA (diabetic ketoacidoses) ([place])   . DKA, type 1, not at goal [facility]) 04/03/2015  . Eczema   . Elevated liver enzymes 11/27/2018  . GERD (gastroesophageal reflux  disease)   . HLD (hyperlipidemia) 11/25/2018  . Hyperbilirubinemia   . Hyperglycemia due to type 1 diabetes mellitus ([place])   . Hyperkalemia   . Hypokalemia 03/17/2013  . Left hip pain 04/18/2014  . Loss of weight 03/17/2013  . Marijuana use 11/25/2018  . Osteoarthritis 12/24/2016  . Prolonged QT interval 11/27/2018   in setting of RBBB and hypokalemia  . Protein-calorie malnutrition, severe ([place]) 03/17/2013  . Psoriasis   . RBBB 12/23/2018   Chronic since at least 2014  . Sepsis ([place]) 01/22/2015  . Substance abuse ([place])   . Tobacco use disorder 09/13/2015  . Type 1 diabetes mellitus ([place])     Past Surgical History:  Procedure Laterality Date  . COLONOSCOPY  2020  . FINGER SURGERY Right    middle finger    Family History  Problem Relation Age of Onset  . Hypertension Father   . Diabetes Other        multiple  . Lupus Cousin   . Stroke Maternal Grandmother   . Stroke Paternal Grandmother   . Colon cancer Neg Hx   . Stomach cancer Neg Hx   . Rectal cancer Neg Hx   . Esophageal cancer Neg Hx   . Colon polyps Neg Hx     Allergies  Allergen Reactions  . Bee Venom   . Latex Itching and Rash  . Penicillins Other (  See Comments)    Childhood allergy Has patient had a PCN reaction causing immediate rash, facial/tongue/throat swelling, SOB or lightheadedness with hypotension: NO Has patient had a PCN reaction causing severe rash involving mucus membranes or skin necrosis:NO Has patient had a PCN reaction that required hospitalization NO Has patient had a PCN reaction occurring within the last 10 years: NO If all of the above answers are "NO", then may proceed with Cephalosporin use.    Outpatient Medications Prior to Visit  Medication Sig Dispense Refill  . Accu-Chek Softclix Lancets lancets Use as instructed 3 times daily. Dx: Type 1 DM 120 each 5  . aspirin-sod bicarb-citric acid (ALKA-SELTZER) 325 MG TBEF tablet Take 325 mg by mouth every 6 (six) hours as needed (heartburn).    .  Blood Glucose Monitoring Suppl (ACCU-CHEK AVIVA PLUS) w/Device KIT TEST 3 TIMES DAILY 1 kit 0  . clobetasol cream (TEMOVATE) 3.55 % Apply 1 application topically 2 (two) times daily as needed.    . fluocinonide (LIDEX) 0.05 % external solution     . glucose blood (ACCU-CHEK AVIVA PLUS) test strip USE TO CHECK BLOOD SUGAR FOUR TIMES DAILY BEFORE MEALS AND AT BEDTIME 120 each 11  . insulin aspart (NOVOLOG) 100 UNIT/ML injection INJECT 0-12 UNITS INTO THE SKIN THREE TIMES DAILY WITH MEALS AS PER SLIDING SCALE 10 mL 2  . Insulin Syringe-Needle U-100 (B-D INS SYRINGE 2CC/29GX1/2") 29G X 1/2" 2 ML MISC Check Blood sugar TID and QHS 100 each 12  . Insulin Glargine (BASAGLAR KWIKPEN) 100 UNIT/ML Inject 0.15 mLs (15 Units total) into the skin daily. 15 mL 2  . pantoprazole (PROTONIX) 40 MG tablet Take 1 tablet (40 mg total) by mouth daily. 30 tablet 3  . acetaminophen-codeine (TYLENOL #3) 300-30 MG tablet Take 1 tablet by mouth every 12 (twelve) hours as needed for moderate pain. Dx:P avascular necrosis of the hip (Patient not taking: Reported on 01/31/2020) 60 tablet 0  . colestipol (COLESTID) 1 g tablet Take 1 tablet (1 g total) by mouth 2 (two) times daily. (Patient not taking: Reported on 01/31/2020) 60 tablet 1  . metoCLOPramide (REGLAN) 5 MG tablet Take 1 tablet (5 mg total) by mouth every 8 (eight) hours as needed for nausea. Take 1 tablet every 8 hours while taking bowel prep (Patient not taking: Reported on 02/14/2020) 5 tablet 0   No facility-administered medications prior to visit.     ROS Review of Systems General: negative for fever, weight loss, appetite change Eyes: no visual symptoms. ENT: no ear symptoms, no sinus tenderness, no nasal congestion or sore throat. Neck: no pain  Respiratory: no wheezing, shortness of breath, cough Cardiovascular: no chest pain, no dyspnea on exertion, no pedal edema, no orthopnea. Gastrointestinal: no abdominal pain, no diarrhea, no  constipation Genito-Urinary: no urinary frequency, no dysuria, no polyuria. Hematologic: no bruising Endocrine: no cold or heat intolerance Neurological: no headaches, no seizures, no tremors Musculoskeletal: Bilateral hip pain Skin: no pruritus, no rash. Psychological: no depression, no anxiety,    Objective:  BP 101/66   Pulse 90   Ht _0  (1.778 m)   Wt 129 lb (58.5 kg)   SpO2 100%   BMI 18.51 kg/m   BP/Weight 02/14/2020 7/32/2025 11/17/7060  Systolic BP 376 283 -  Diastolic BP 66 99 -  Wt. (Lbs) 129 127.6 127.6  BMI 18.51 18.31 18.31      Physical Exam Constitutional: Thin appearing  Eyes: PERRLA HEENT: Head is atraumatic, normal sinuses, normal oropharynx, normal  appearing tonsils and palate, tympanic membrane is normal bilaterally. Neck: normal range of motion, no thyromegaly, no JVD Cardiovascular: normal rate and rhythm, normal heart sounds, no murmurs, rub or gallop, no pedal edema Respiratory: Normal breath sounds, clear to auscultation bilaterally, no wheezes, no rales, no rhonchi Abdomen: soft, not tender to palpation, normal bowel sounds, no enlarged organs Musculoskeletal: Tenderness on range of motion of both hips Skin: warm and dry, no lesions. Neurological: alert, oriented x3, cranial nerves I-XII grossly intact , normal motor strength, normal sensation. Psychological: normal mood.   CMP Latest Ref Rng & Units 12/30/2019 08/02/2019 12/16/2018  Glucose 65 - 99 mg/dL - 141(H) 161(H)  BUN 6 - 24 mg/dL - 4(L) 9  Creatinine 0.76 - 1.27 mg/dL - 0.68(L) 0.91  Sodium 134 - 144 mmol/L - 139 136  Potassium 3.5 - 5.2 mmol/L - 3.8 4.2  Chloride 96 - 106 mmol/L - 101 103  CO2 20 - 29 mmol/L - 26 19(L)  Calcium 8.7 - 10.2 mg/dL - 9.3 9.5  Total Protein 6.0 - 8.3 g/dL 6.8 - 6.3  Total Bilirubin 0.2 - 1.2 mg/dL 0.8 - 0.7  Alkaline Phos 39 - 117 U/L 119(H) - 153(H)  AST 0 - 37 U/L 28 - 38  ALT 0 - 53 U/L 28 - 77(H)    Lipid Panel     Component Value Date/Time    CHOL 109 10/19/2018 1045   TRIG 39 10/19/2018 1045   HDL 69 10/19/2018 1045   CHOLHDL 1.6 10/19/2018 1045   CHOLHDL 2.3 12/19/2015 0947   VLDL 12 12/19/2015 0947   LDLCALC 32 10/19/2018 1045    CBC    Component Value Date/Time   WBC 5.0 12/30/2019 1651   RBC 3.99 (L) 12/30/2019 1651   HGB 11.6 (L) 12/30/2019 1651   HGB 12.4 (L) 12/16/2018 1444   HCT 34.6 (L) 12/30/2019 1651   HCT 36.8 (L) 12/16/2018 1444   PLT 376 12/30/2019 1651   PLT 299 12/16/2018 1444   MCV 86.7 12/30/2019 1651   MCV 89 12/16/2018 1444   MCH 29.1 12/30/2019 1651   MCHC 33.5 12/30/2019 1651   RDW 13.1 12/30/2019 1651   RDW 13.0 12/16/2018 1444   LYMPHSABS 2,095 12/30/2019 1651   LYMPHSABS 2.1 12/16/2018 1444   MONOABS 0.4 11/25/2018 0352   EOSABS 170 12/30/2019 1651   EOSABS 0.2 12/16/2018 1444   BASOSABS 50 12/30/2019 1651   BASOSABS 0.0 12/16/2018 1444    Lab Results  Component Value Date   HGBA1C 12.1 (A) 02/14/2020    Assessment & Plan:   1. Type 1 diabetes mellitus with hypoglycemia and without coma (HCC) Uncontrolled with A1c of 12.1 He has very labile glycemic control evidenced by hypoglycemia in the clinic which was treated with glucose gel and crackers with improvement He will benefit from continuous glucose monitoring on an insulin pump; since he has an upcoming appointment with endocrinology I will defer this to them Continue current regimen - POCT glucose (manual entry) - POCT glycosylated hemoglobin (Hb A1C) - LP+Non-HDL Cholesterol - Microalbumin / creatinine urine ratio - Basic Metabolic Panel - Ambulatory referral to Ophthalmology - Insulin Glargine (BASAGLAR KWIKPEN) 100 UNIT/ML; Inject 0.12 mLs (12 Units total) into the skin daily.  Dispense: 15 mL; Refill: 1 - POCT glucose (manual entry)  2. Gastroesophageal reflux disease without esophagitis Stable - pantoprazole (PROTONIX) 40 MG tablet; Take 1 tablet (40 mg total) by mouth daily.  Dispense: 30 tablet; Refill:  3  3. Psoriasis Controlled  on triamcinolone Followed by dermatology as well  Meds ordered this encounter  Medications  . atorvastatin (LIPITOR) 20 MG tablet    Sig: Take 1 tablet (20 mg total) by mouth daily.    Dispense:  30 tablet    Refill:  3  . pantoprazole (PROTONIX) 40 MG tablet    Sig: Take 1 tablet (40 mg total) by mouth daily.    Dispense:  30 tablet    Refill:  3  . Insulin Glargine (BASAGLAR KWIKPEN) 100 UNIT/ML    Sig: Inject 0.12 mLs (12 Units total) into the skin daily.    Dispense:  15 mL    Refill:  1    Follow-up: Return in about 3 months (around 05/16/2020) for Chronic disease management.       Charlott Rakes, MD, FAAFP. [facility] [place], [place]   02/14/2020, 5:49 PM

## 2020-02-14 NOTE — Patient Instructions (Signed)
Hypoglycemia Hypoglycemia is when the sugar (glucose) level in your blood is too low. Signs of low blood sugar may include:  Feeling: ? Hungry. ? Worried or nervous (anxious). ? Sweaty and clammy. ? Confused. ? Dizzy. ? Sleepy. ? Sick to your stomach (nauseous).  Having: ? A fast heartbeat. ? A headache. ? A change in your vision. ? Tingling or no feeling (numbness) around your mouth, lips, or tongue. ? Jerky movements that you cannot control (seizure).  Having trouble with: ? Moving (coordination). ? Sleeping. ? Passing out (fainting). ? Getting upset easily (irritability). Low blood sugar can happen to people who have diabetes and people who do not have diabetes. Low blood sugar can happen quickly, and it can be an emergency. Treating low blood sugar Low blood sugar is often treated by eating or drinking something sugary right away, such as:  Fruit juice, 4-6 oz (120-150 mL).  Regular soda (not diet soda), 4-6 oz (120-150 mL).  Low-fat milk, 4 oz (120 mL).  Several pieces of hard candy.  Sugar or honey, 1 Tbsp (15 mL). Treating low blood sugar if you have diabetes If you can think clearly and swallow safely, follow the 15:15 rule:  Take 15 grams of a fast-acting carb (carbohydrate). Talk with your doctor about how much you should take.  Always keep a source of fast-acting carb with you, such as: ? Sugar tablets (glucose pills). Take 3-4 pills. ? 6-8 pieces of hard candy. ? 4-6 oz (120-150 mL) of fruit juice. ? 4-6 oz (120-150 mL) of regular (not diet) soda. ? 1 Tbsp (15 mL) honey or sugar.  Check your blood sugar 15 minutes after you take the carb.  If your blood sugar is still at or below 70 mg/dL (3.9 mmol/L), take 15 grams of a carb again.  If your blood sugar does not go above 70 mg/dL (3.9 mmol/L) after 3 tries, get help right away.  After your blood sugar goes back to normal, eat a meal or a snack within 1 hour.  Treating very low blood sugar If your  blood sugar is at or below 54 mg/dL (3 mmol/L), you have very low blood sugar (severe hypoglycemia). This may also cause:  Passing out.  Jerky movements you cannot control (seizure).  Losing consciousness (coma). This is an emergency. Do not wait to see if the symptoms will go away. Get medical help right away. Call your local emergency services (911 in the [place] Do not drive yourself to the hospital. If you have very low blood sugar and you cannot eat or drink, you may need a glucagon shot (injection). A family member or friend should learn how to check your blood sugar and how to give you a glucagon shot. Ask your doctor if you need to have a glucagon shot kit at home. Follow these instructions at home: General instructions  Take over-the-counter and prescription medicines only as told by your doctor.  Stay aware of your blood sugar as told by your doctor.  Limit alcohol intake to no more than 1 drink a day for nonpregnant women and 2 drinks a day for men. One drink equals 12 oz of beer (355 mL), 5 oz of wine (148 mL), or 1 oz of hard liquor (44 mL).  Keep all follow-up visits as told by your doctor. This is important. If you have diabetes:   Follow your diabetes care plan as told by your doctor. Make sure you: ? Know the signs of low blood sugar. ?  Take your medicines as told. ? Follow your exercise and meal plan. ? Eat on time. Do not skip meals. ? Check your blood sugar as often as told by your doctor. Always check it before and after exercise. ? Follow your sick day plan when you cannot eat or drink normally. Make this plan ahead of time with your doctor.  Share your diabetes care plan with: ? Your work or school. ? People you live with.  Check your pee (urine) for ketones: ? When you are sick. ? As told by your doctor.  Carry a card or wear jewelry that says you have diabetes. Contact a doctor if:  You have trouble keeping your blood sugar in your target  range.  You have low blood sugar often. Get help right away if:  You still have symptoms after you eat or drink something sugary.  Your blood sugar is at or below 54 mg/dL (3 mmol/L).  You have jerky movements that you cannot control.  You pass out. These symptoms may be an emergency. Do not wait to see if the symptoms will go away. Get medical help right away. Call your local emergency services (911 in the [place] Do not drive yourself to the hospital. Summary  Hypoglycemia happens when the level of sugar (glucose) in your blood is too low.  Low blood sugar can happen to people who have diabetes and people who do not have diabetes. Low blood sugar can happen quickly, and it can be an emergency.  Make sure you know the signs of low blood sugar and know how to treat it.  Always keep a source of sugar (fast-acting carb) with you to treat low blood sugar. This information is not intended to replace advice given to you by your health care provider. Make sure you discuss any questions you have with your health care provider. Document Revised: 11/25/2018 Document Reviewed: 09/07/2015 Elsevier Patient Education  2020 Elsevier Inc.  

## 2020-02-15 ENCOUNTER — Telehealth: Payer: Medicare Other | Admitting: Gastroenterology

## 2020-02-15 LAB — BASIC METABOLIC PANEL
BUN/Creatinine Ratio: 8 — ABNORMAL LOW (ref 9–20)
BUN: 7 mg/dL (ref 6–24)
CO2: 26 mmol/L (ref 20–29)
Calcium: 9.4 mg/dL (ref 8.7–10.2)
Chloride: 100 mmol/L (ref 96–106)
Creatinine, Ser: 0.9 mg/dL (ref 0.76–1.27)
GFR calc Af Amer: 121 mL/min/{1.73_m2} (ref >59)
GFR calc non Af Amer: 104 mL/min/{1.73_m2} (ref >59)
Glucose: 62 mg/dL — ABNORMAL LOW (ref 65–99)
Potassium: 3.6 mmol/L (ref 3.5–5.2)
Sodium: 140 mmol/L (ref 134–144)

## 2020-02-15 LAB — MICROALBUMIN / CREATININE URINE RATIO
Creatinine, Urine: 159.4 mg/dL
Microalb/Creat Ratio: 13 mg/g creat (ref 0–29)
Microalbumin, Urine: 20 ug/mL

## 2020-02-15 LAB — LP+NON-HDL CHOLESTEROL
Cholesterol, Total: 123 mg/dL (ref 100–199)
HDL: 45 mg/dL (ref >39)
LDL Chol Calc (NIH): 68 mg/dL (ref 0–99)
Total Non-HDL-Chol (LDL+VLDL): 78 mg/dL (ref 0–129)
Triglycerides: 39 mg/dL (ref 0–149)
VLDL Cholesterol Cal: 10 mg/dL (ref 5–40)

## 2020-02-16 ENCOUNTER — Telehealth: Payer: Self-pay

## 2020-02-16 NOTE — Telephone Encounter (Signed)
Patient name and DOB has been verified Patient was informed of lab results. Patient had no questions.  Results were given to patient's wife.

## 2020-02-16 NOTE — Telephone Encounter (Signed)
-----   Message from Charlott Rakes, MD sent at 02/15/2020 12:44 PM EDT ----- Please inform the patient that labs are normal. Thank you.

## 2020-03-01 ENCOUNTER — Other Ambulatory Visit (HOSPITAL_COMMUNITY): Admission: RE | Admit: 2020-03-01 | Payer: Medicare Other | Source: Ambulatory Visit

## 2020-03-02 ENCOUNTER — Other Ambulatory Visit: Payer: Self-pay

## 2020-03-02 ENCOUNTER — Telehealth: Payer: Self-pay | Admitting: Gastroenterology

## 2020-03-02 DIAGNOSIS — D128 Benign neoplasm of rectum: Secondary | ICD-10-CM

## 2020-03-02 DIAGNOSIS — D129 Benign neoplasm of anus and anal canal: Secondary | ICD-10-CM

## 2020-03-02 DIAGNOSIS — D123 Benign neoplasm of transverse colon: Secondary | ICD-10-CM

## 2020-03-02 DIAGNOSIS — K529 Noninfective gastroenteritis and colitis, unspecified: Secondary | ICD-10-CM

## 2020-03-02 DIAGNOSIS — D509 Iron deficiency anemia, unspecified: Secondary | ICD-10-CM

## 2020-03-02 NOTE — Telephone Encounter (Signed)
Left message with family member to return call.

## 2020-03-02 NOTE — Telephone Encounter (Signed)
The pt has been rescheduled to 05/07/20 new instructions have been mailed to the pt.  Covid testing also moved and pt made aware

## 2020-03-02 NOTE — Progress Notes (Signed)
Attempted pre call for endo procedure on Monday 7/19, no answer, automated VM.

## 2020-03-02 NOTE — Progress Notes (Signed)
Mr Vosler said , when I called about Colopnoscopy for Monday, that the office told his wife that it had to be rescheduled because, " Dr. Rush Landmark was not going to be here that day."    I asked patient if it was the procedure on the 19th, he said yes. Mr Hevia said he will call the office right now.

## 2020-03-19 DIAGNOSIS — Z9189 Other specified personal risk factors, not elsewhere classified: Secondary | ICD-10-CM | POA: Diagnosis not present

## 2020-03-19 DIAGNOSIS — Z794 Long term (current) use of insulin: Secondary | ICD-10-CM | POA: Diagnosis not present

## 2020-03-19 DIAGNOSIS — E1065 Type 1 diabetes mellitus with hyperglycemia: Secondary | ICD-10-CM | POA: Diagnosis not present

## 2020-03-19 DIAGNOSIS — E108 Type 1 diabetes mellitus with unspecified complications: Secondary | ICD-10-CM | POA: Diagnosis not present

## 2020-03-19 DIAGNOSIS — M87052 Idiopathic aseptic necrosis of left femur: Secondary | ICD-10-CM | POA: Diagnosis not present

## 2020-03-19 DIAGNOSIS — M87051 Idiopathic aseptic necrosis of right femur: Secondary | ICD-10-CM | POA: Diagnosis not present

## 2020-03-19 DIAGNOSIS — E1165 Type 2 diabetes mellitus with hyperglycemia: Secondary | ICD-10-CM | POA: Diagnosis not present

## 2020-03-20 MED FILL — BASAGLAR 100 UNIT/ML KWIKPE: 100 | 125 days supply | Qty: 15 | Fill #0

## 2020-03-23 MED FILL — NovoLOG 100 UNIT/ML SOLN: 100 | 27 days supply | Qty: 10 | Fill #2

## 2020-03-26 ENCOUNTER — Other Ambulatory Visit (HOSPITAL_COMMUNITY): Payer: Self-pay | Admitting: "Endocrinology

## 2020-03-29 ENCOUNTER — Telehealth: Payer: Self-pay | Admitting: Family Medicine

## 2020-03-29 NOTE — Telephone Encounter (Signed)
Patient requesting sildenafil (chart does not reflect) informed patient please allow 48 to 72 hour turn around time  [place], [place] [employer] Phone:  [phone]  Fax:  [phone]

## 2020-03-29 NOTE — Telephone Encounter (Signed)
Patient requesting sildenafil.

## 2020-03-30 MED ORDER — SILDENAFIL CITRATE 50 MG PO TABS
50.0000 mg | ORAL_TABLET | Freq: Every day | ORAL | 1 refills | Status: DC | PRN
Start: 2020-03-30 — End: 2020-05-16

## 2020-03-30 NOTE — Telephone Encounter (Signed)
Done

## 2020-04-02 NOTE — Telephone Encounter (Signed)
Patient was called and no voicemail picked up at this time.

## 2020-04-03 DIAGNOSIS — E108 Type 1 diabetes mellitus with unspecified complications: Secondary | ICD-10-CM | POA: Diagnosis not present

## 2020-04-24 DIAGNOSIS — L988 Other specified disorders of the skin and subcutaneous tissue: Secondary | ICD-10-CM | POA: Diagnosis not present

## 2020-05-03 ENCOUNTER — Other Ambulatory Visit (HOSPITAL_COMMUNITY)
Admission: RE | Admit: 2020-05-03 | Discharge: 2020-05-03 | Disposition: A | Discharge status: Home / Self Care | Payer: Medicare Other | Source: Ambulatory Visit | Attending: Gastroenterology | Admitting: Gastroenterology

## 2020-05-03 DIAGNOSIS — Z01812 Encounter for preprocedural laboratory examination: Secondary | ICD-10-CM | POA: Insufficient documentation

## 2020-05-03 DIAGNOSIS — Z20822 Contact with and (suspected) exposure to covid-19: Secondary | ICD-10-CM | POA: Insufficient documentation

## 2020-05-03 LAB — SARS CORONAVIRUS 2 (TAT 6-24 HRS): SARS Coronavirus 2: NEGATIVE

## 2020-05-04 ENCOUNTER — Other Ambulatory Visit: Payer: Self-pay

## 2020-05-04 ENCOUNTER — Encounter (HOSPITAL_COMMUNITY): Payer: Self-pay | Admitting: Gastroenterology

## 2020-05-04 IMAGING — CR DG ABDOMEN ACUTE W/ 1V CHEST
3 series · 3 of 3 positions shown · non-contrast
Comparison: CT abdomen and pelvis 08/01/2017. Single-view of the
chest 03/31/2016.

CLINICAL DATA: Vomiting for 2 days.

EXAM:
DG ABDOMEN ACUTE W/ 1V CHEST

[abdomen erect]
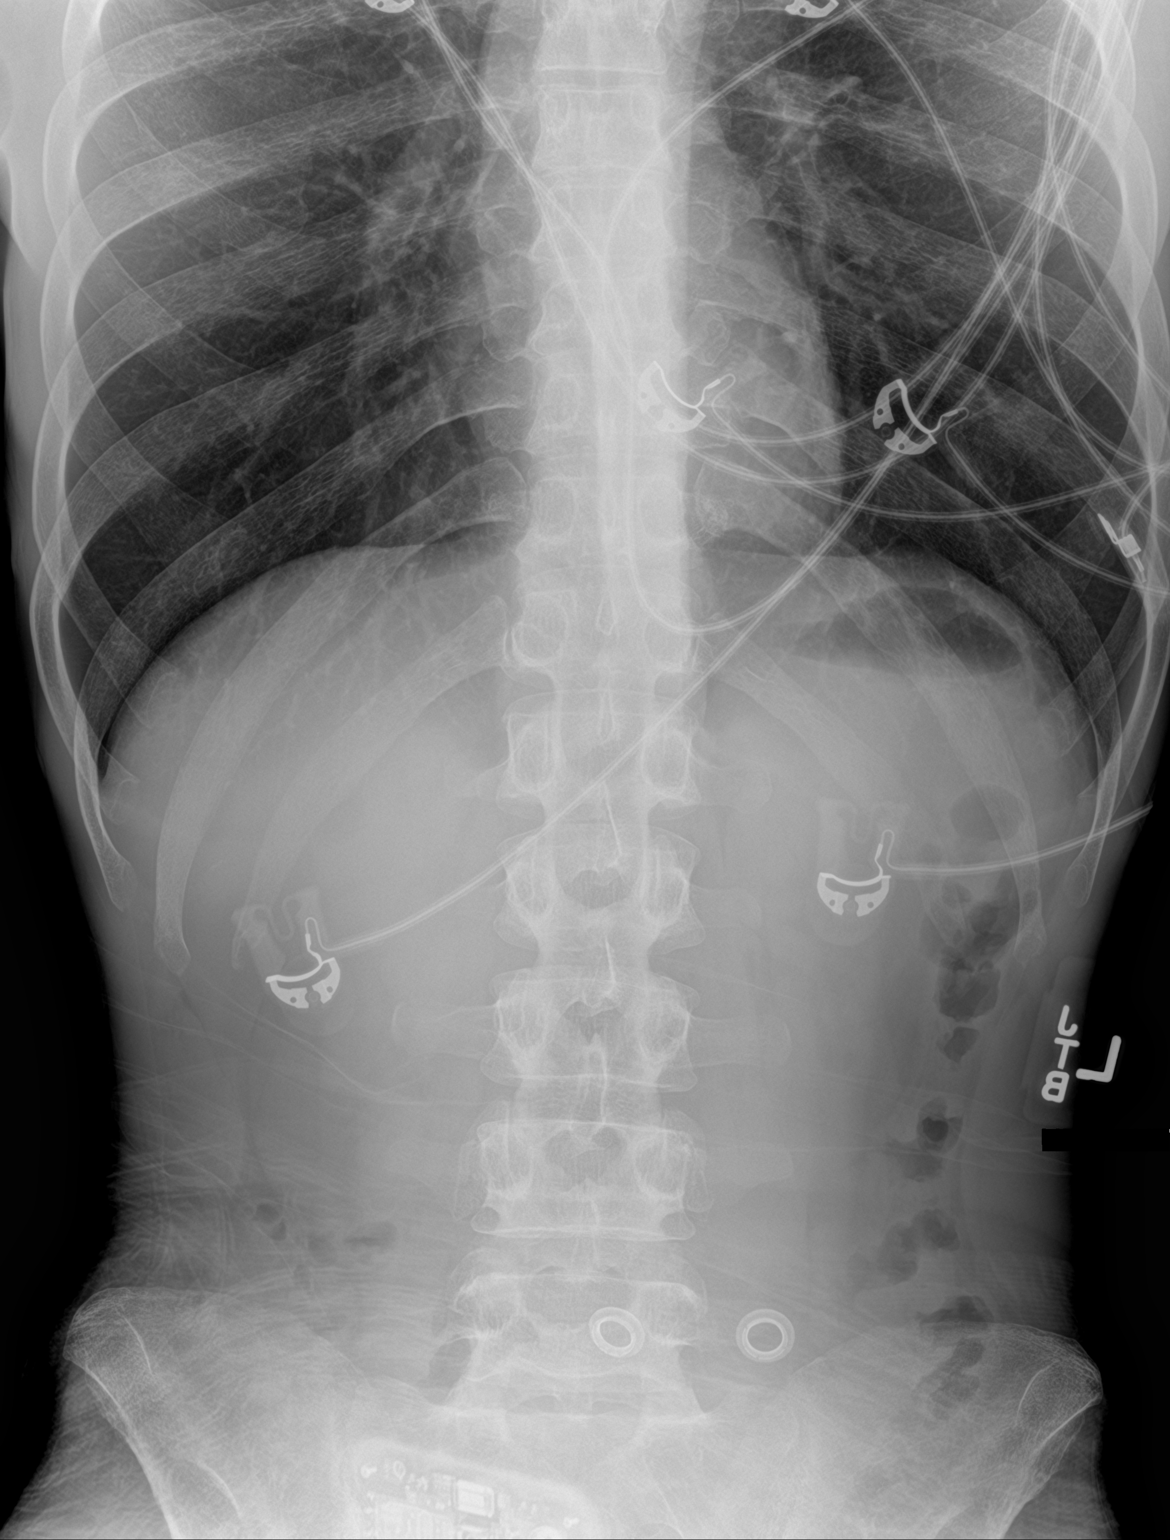

[abdomen supine]
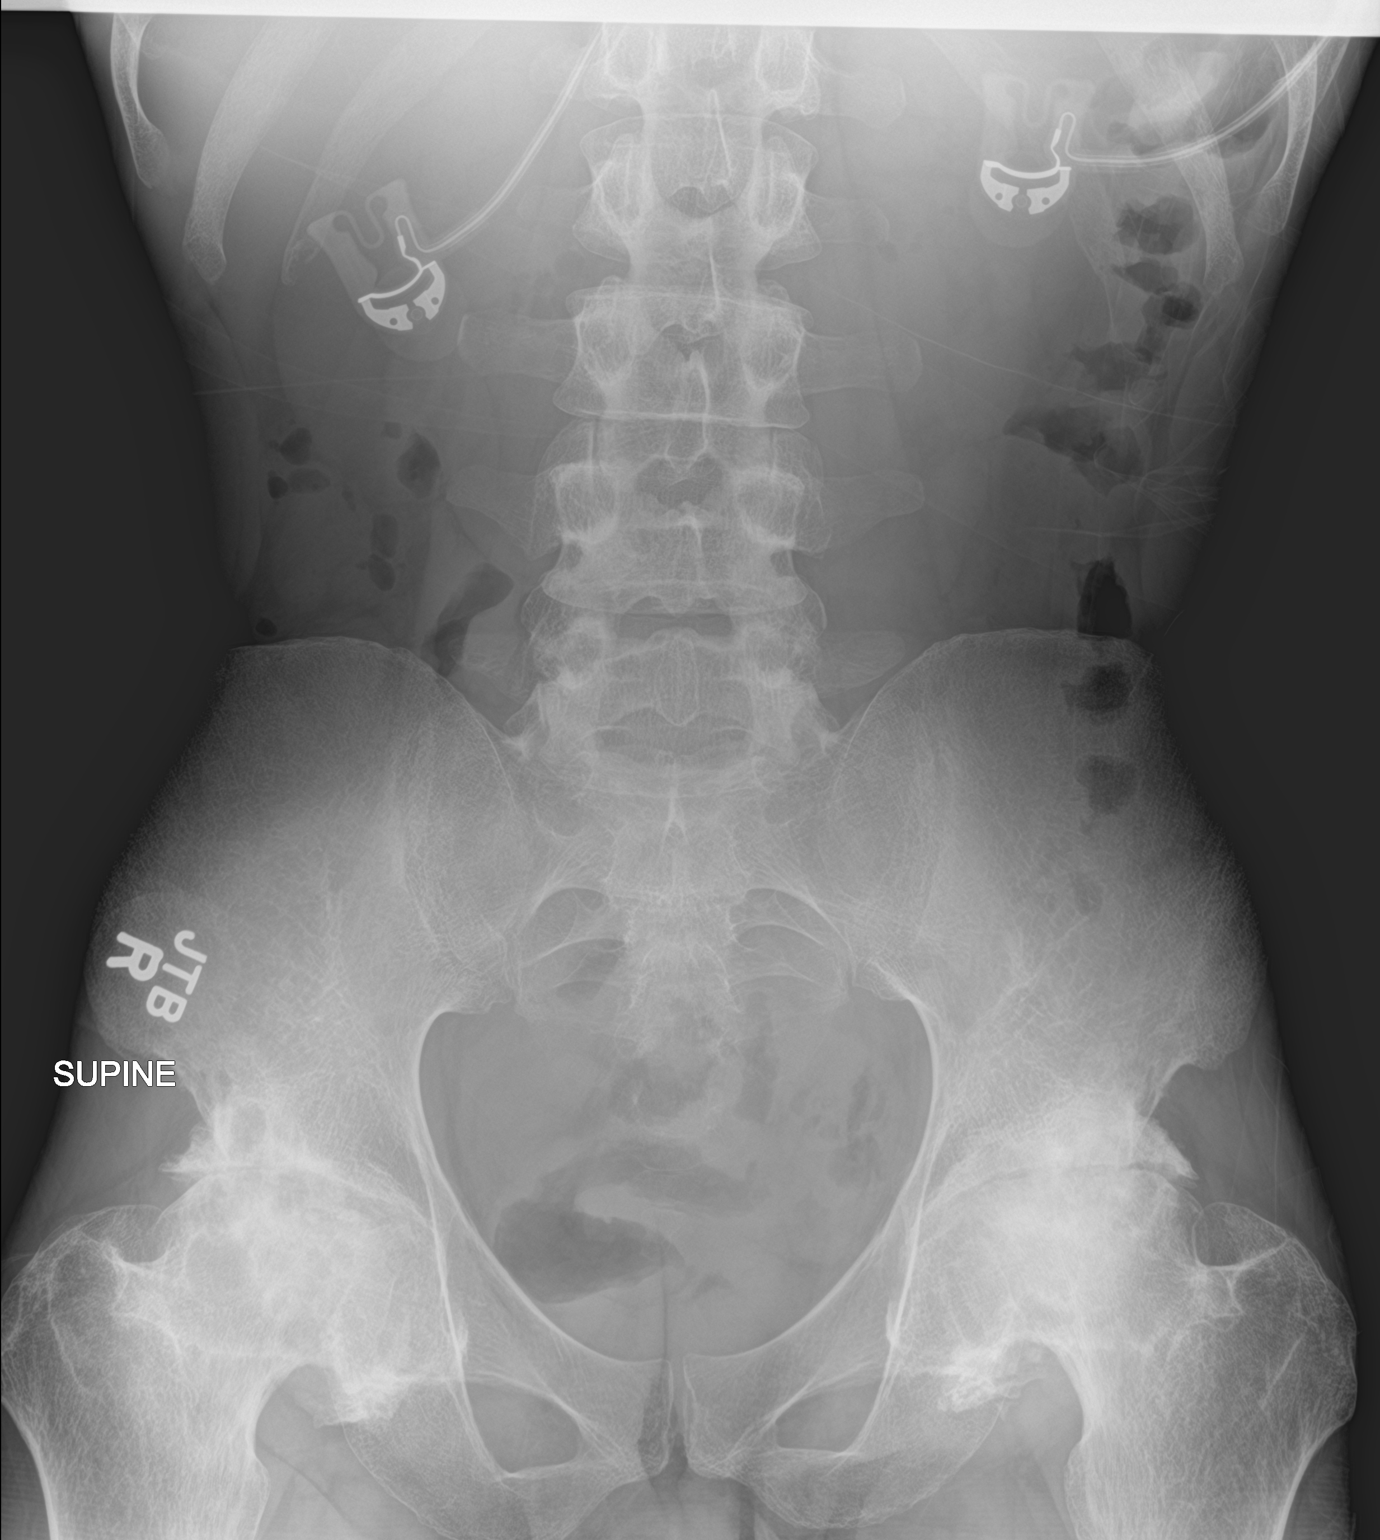

[chest ap]
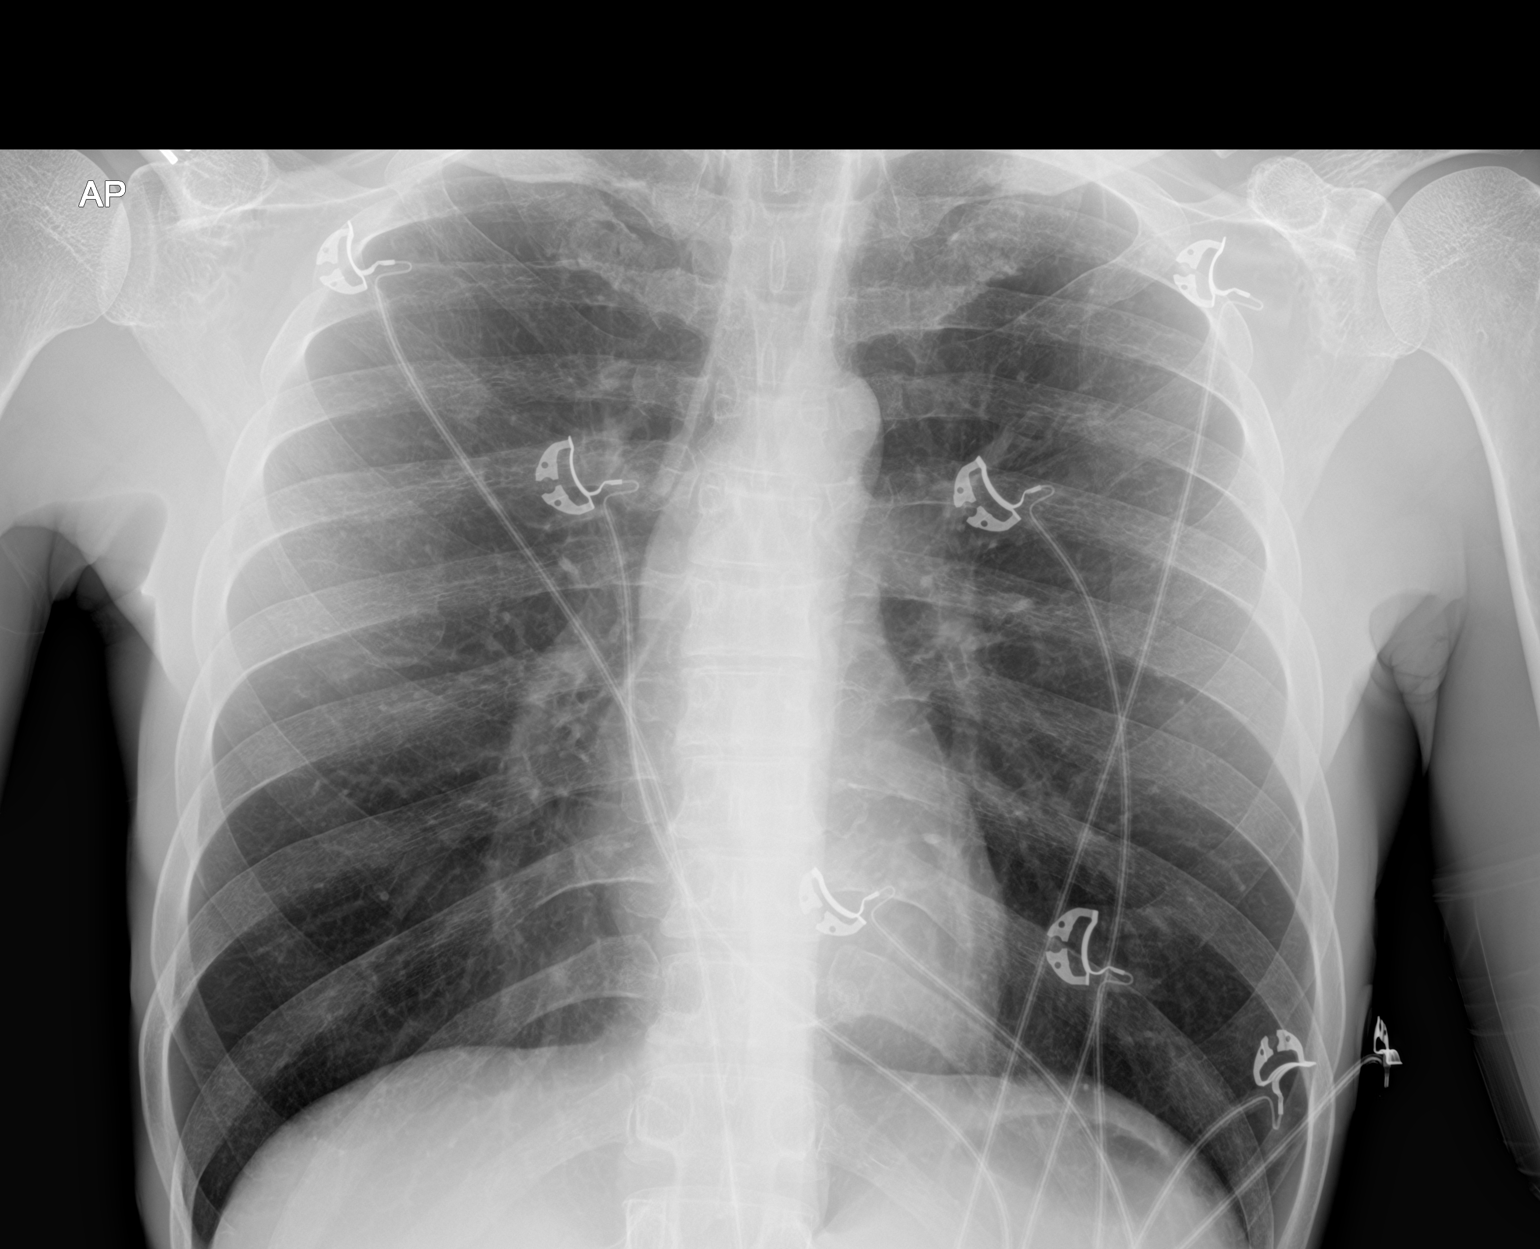

[3 of 3 positions shown; findings below may reference images not displayed]

FINDINGS: Single-view of the chest demonstrates clear lungs and normal heart
size. No pneumothorax or pleural effusion.

Two views of the abdomen show no free intraperitoneal air. The bowel
gas pattern is normal. Severe bilateral hip osteoarthritis is
markedly advanced for age and unchanged in appearance.
IMPRESSION: No acute finding chest or abdomen.

Severe bilateral hip osteoarthritis appears unchanged.

## 2020-05-04 NOTE — Progress Notes (Signed)
PCP - Dr Charlott Rakes Cardiologist - n/a [facility] - Dr Havery Moros  Chest x-ray - n/a EKG - 11/26/18 Stress Test - 01/21/19 ECHO - 01/18/19 Cardiac Cath - n/a  Fasting Blood Sugar - 200s Checks Blood Sugar _3-4_ times a day  . THE MORNING OF SURGERY, take 10 units of Insulin Glargine Insulin.  . The day of surgery, If your CBG is greater than 220 mg/dL, you may take  of your sliding scale (correction) dose of Novolog Insulin.  . If your blood sugar is less than 70 mg/dL, you will need to treat for low blood sugar: o Treat a low blood sugar (less than 70 mg/dL) with  cup of clear juice (cranberry or apple), 4 glucose tablets, OR glucose gel. o Recheck blood sugar in 15 minutes after treatment (to make sure it is greater than 70 mg/dL). If your blood sugar is not greater than 70 mg/dL on recheck, call [phone] for further instructions.  Anesthesia review: Yes-James, PA  STOP now taking any Aspirin (unless otherwise instructed by your surgeon), Aleve, Naproxen, Ibuprofen, Motrin, Advil, Goody's, BC's, all herbal medications, fish oil, and all vitamins.   Coronavirus Screening Covid test on 05/03/20 was negative.  Patient verbalized understanding of instructions that were given via phone.

## 2020-05-07 ENCOUNTER — Other Ambulatory Visit: Payer: Self-pay

## 2020-05-07 ENCOUNTER — Ambulatory Visit (HOSPITAL_COMMUNITY)
Admission: RE | Admit: 2020-05-07 | Discharge: 2020-05-07 | Disposition: A | Discharge status: Home / Self Care | Payer: Medicare Other | Source: Ambulatory Visit | Attending: Gastroenterology | Admitting: Gastroenterology

## 2020-05-07 ENCOUNTER — Ambulatory Visit (HOSPITAL_COMMUNITY): Payer: Medicare Other | Admitting: Physician Assistant

## 2020-05-07 ENCOUNTER — Encounter (HOSPITAL_COMMUNITY)
Admission: RE | Disposition: A | Discharge status: Home / Self Care | Payer: Self-pay | Source: Ambulatory Visit | Attending: Gastroenterology

## 2020-05-07 ENCOUNTER — Encounter (HOSPITAL_COMMUNITY): Payer: Self-pay | Admitting: Gastroenterology

## 2020-05-07 DIAGNOSIS — E43 Unspecified severe protein-calorie malnutrition: Secondary | ICD-10-CM | POA: Insufficient documentation

## 2020-05-07 DIAGNOSIS — K644 Residual hemorrhoidal skin tags: Secondary | ICD-10-CM | POA: Insufficient documentation

## 2020-05-07 DIAGNOSIS — Z833 Family history of diabetes mellitus: Secondary | ICD-10-CM | POA: Insufficient documentation

## 2020-05-07 DIAGNOSIS — Z880 Allergy status to penicillin: Secondary | ICD-10-CM | POA: Diagnosis not present

## 2020-05-07 DIAGNOSIS — E1065 Type 1 diabetes mellitus with hyperglycemia: Secondary | ICD-10-CM | POA: Diagnosis not present

## 2020-05-07 DIAGNOSIS — Z794 Long term (current) use of insulin: Secondary | ICD-10-CM | POA: Diagnosis not present

## 2020-05-07 DIAGNOSIS — Z681 Body mass index (BMI) 19 or less, adult: Secondary | ICD-10-CM | POA: Diagnosis not present

## 2020-05-07 DIAGNOSIS — K219 Gastro-esophageal reflux disease without esophagitis: Secondary | ICD-10-CM | POA: Diagnosis not present

## 2020-05-07 DIAGNOSIS — E785 Hyperlipidemia, unspecified: Secondary | ICD-10-CM | POA: Diagnosis not present

## 2020-05-07 DIAGNOSIS — Z86010 Personal history of colonic polyps: Secondary | ICD-10-CM | POA: Diagnosis not present

## 2020-05-07 DIAGNOSIS — K641 Second degree hemorrhoids: Secondary | ICD-10-CM | POA: Diagnosis not present

## 2020-05-07 DIAGNOSIS — Z87891 Personal history of nicotine dependence: Secondary | ICD-10-CM | POA: Insufficient documentation

## 2020-05-07 DIAGNOSIS — N179 Acute kidney failure, unspecified: Secondary | ICD-10-CM | POA: Diagnosis not present

## 2020-05-07 DIAGNOSIS — Z91040 Latex allergy status: Secondary | ICD-10-CM | POA: Insufficient documentation

## 2020-05-07 DIAGNOSIS — E1010 Type 1 diabetes mellitus with ketoacidosis without coma: Secondary | ICD-10-CM | POA: Insufficient documentation

## 2020-05-07 DIAGNOSIS — K5730 Diverticulosis of large intestine without perforation or abscess without bleeding: Secondary | ICD-10-CM | POA: Diagnosis not present

## 2020-05-07 DIAGNOSIS — M1990 Unspecified osteoarthritis, unspecified site: Secondary | ICD-10-CM | POA: Diagnosis not present

## 2020-05-07 DIAGNOSIS — K635 Polyp of colon: Secondary | ICD-10-CM | POA: Insufficient documentation

## 2020-05-07 DIAGNOSIS — Z1211 Encounter for screening for malignant neoplasm of colon: Secondary | ICD-10-CM | POA: Diagnosis not present

## 2020-05-07 HISTORY — PX: COLONOSCOPY WITH PROPOFOL: SHX5780

## 2020-05-07 HISTORY — PX: ENDOSCOPIC MUCOSAL RESECTION: SHX6839

## 2020-05-07 HISTORY — PX: SUBMUCOSAL LIFTING INJECTION: SHX6855

## 2020-05-07 LAB — GLUCOSE, CAPILLARY: Glucose-Capillary: 229 mg/dL — ABNORMAL HIGH (ref 70–99)

## 2020-05-07 SURGERY — COLONOSCOPY WITH PROPOFOL
Anesthesia: Monitor Anesthesia Care

## 2020-05-07 MED ORDER — GLUCAGON HCL RDNA (DIAGNOSTIC) 1 MG IJ SOLR
INTRAMUSCULAR | Status: AC
  Filled 2020-05-07: qty 1

## 2020-05-07 MED ORDER — LIDOCAINE 2% (20 MG/ML) 5 ML SYRINGE
INTRAMUSCULAR | Status: DC | PRN
Start: 2020-05-07 — End: 2020-05-07
  Administered 2020-05-07: 60 mg via INTRAVENOUS

## 2020-05-07 MED ORDER — PROPOFOL 500 MG/50ML IV EMUL
INTRAVENOUS | Status: DC | PRN
  Administered 2020-05-07: 200 ug/kg/min via INTRAVENOUS

## 2020-05-07 MED ORDER — HYDROCORTISONE ACETATE 25 MG RE SUPP: 25.0000 mg | Freq: Every day | RECTAL | 0 refills | Status: AC

## 2020-05-07 MED ORDER — PHENYLEPHRINE 40 MCG/ML (10ML) SYRINGE FOR IV PUSH (FOR BLOOD PRESSURE SUPPORT)
PREFILLED_SYRINGE | INTRAVENOUS | Status: DC | PRN
  Administered 2020-05-07: 40 ug via INTRAVENOUS
  Administered 2020-05-07 (×6): 80 ug via INTRAVENOUS

## 2020-05-07 MED ORDER — GLUCAGON HCL RDNA (DIAGNOSTIC) 1 MG IJ SOLR
INTRAMUSCULAR | Status: DC | PRN
  Administered 2020-05-07: .25 mg via INTRAVENOUS

## 2020-05-07 MED ORDER — LACTATED RINGERS IV SOLN
INTRAVENOUS | Status: DC | PRN
Start: 2020-05-07 — End: 2020-05-07

## 2020-05-07 MED ORDER — PROPOFOL 10 MG/ML IV BOLUS
INTRAVENOUS | Status: DC | PRN
  Administered 2020-05-07: 50 mg via INTRAVENOUS
  Administered 2020-05-07 (×4): 20 mg via INTRAVENOUS

## 2020-05-07 SURGICAL SUPPLY — 21 items

## 2020-05-07 NOTE — Op Note (Addendum)
[facility] Patient Name: Jared Murray Procedure Date : 05/07/2020 MRN: [id number] Attending MD: Justice Britain , MD Date of Birth: [birth year] CSN: [id number] Age: 44 Admit Type: Outpatient Procedure:                Colonoscopy Indications:              Excision of colonic polyp Providers:                Justice Britain, MD, Burtis Junes, RN, Cletis [place],                            Technician Referring MD:             Carlota Raspberry. Havery Moros, MD, Charlott Rakes Medicines:                Monitored Anesthesia Care, Glucagon 0.5 mg IV Complications:            No immediate complications. Estimated Blood Loss:     Estimated blood loss was minimal. Procedure:                Pre-Anesthesia Assessment:                           - Prior to the procedure, a History and Physical                            was performed, and patient medications and                            allergies were reviewed. The patient's tolerance of                            previous anesthesia was also reviewed. The risks                            and benefits of the procedure and the sedation                            options and risks were discussed with the patient.                            All questions were answered, and informed consent                            was obtained. Prior Anticoagulants: The patient has                            taken no previous anticoagulant or antiplatelet                            agents except for aspirin. ASA Grade Assessment: II                            - A patient with mild systemic disease. After  reviewing the risks and benefits, the patient was                            deemed in satisfactory condition to undergo the                            procedure.                           After obtaining informed consent, the colonoscope                            was passed under direct vision. Throughout the                             procedure, the patient's blood pressure, pulse, and                            oxygen saturations were monitored continuously. The                            CF-HQ190L ([id number] Olympus colonoscope was                            introduced through the anus and advanced to the 5                            cm into the ileum. The colonoscopy was technically                            difficult and complex due to significant looping                            and significant vasospasm occuring throughout the                            colonoscopy as well necessitating use of Glucagon.                            Successful completion of the procedure was aided by                            changing the patient's position, using manual                            pressure, withdrawing and reinserting the scope,                            straightening and shortening the scope to obtain                            bowel loop reduction and using scope torsion. The  patient tolerated the procedure. The quality of the                            bowel preparation was adequate. The terminal ileum,                            ileocecal valve, appendiceal orifice, and rectum                            were photographed. Scope In: 1:22:11 PM Scope Out: 2:20:13 PM Scope Withdrawal Time: 0 hours 52 minutes 56 seconds  Total Procedure Duration: 0 hours 58 minutes 2 seconds  Findings:      The digital rectal exam findings include hemorrhoids. Pertinent       negatives include no palpable rectal lesions.      The terminal ileum and ileocecal valve appeared normal.      A 20 mm polypoid lesion was found prolapsing from the ileocecal valve       into the base of the cecum and onto the ileocecal valve as well. The       polyp was sessile. Preparations were made for mucosal resection. NBI       imaging and White-light endoscopy was done to demarcate the borders of       the  lesion. Orise gel was injected to raise the lesion. Piecemeal       mucosal resection using a snare was performed. Resection and retrieval       were complete.      Multiple small and large-mouthed diverticula were found in the       recto-sigmoid colon, sigmoid colon, transverse colon and ascending colon.      Normal mucosa was found in the entire colon otherwise.      Non-bleeding non-thrombosed external and internal hemorrhoids were found       during retroflexion, during perianal exam and during digital exam. The       hemorrhoids were Grade II (internal hemorrhoids that prolapse but reduce       spontaneously). Impression:               - Hemorrhoids found on digital rectal exam.                           - The examined portion of the ileum was normal.                           - One 20 mm polypoid lesion prolapsing from the                            ileocecal valve on the base of the cecum and onto                            the ileocecal valve region of the ascending colon                            was removed with piecemeal cold mucosal resection.  Resected and retrieved.                           - Diverticulosis in the recto-sigmoid colon, in the                            sigmoid colon, in the transverse colon and in the                            ascending colon.                           - Non-bleeding non-thrombosed external and internal                            hemorrhoids. Recommendation:           - The patient will be observed post-procedure,                            until all discharge criteria are met.                           - Discharge patient to home.                           - Patient has a contact number available for                            emergencies. The signs and symptoms of potential                            delayed complications were discussed with the                            patient. Return to normal activities  tomorrow.                            Written discharge instructions were provided to the                            patient.                           - Low residue diet for 1 week.                           - Continue present medications.                           - No aspirin, ibuprofen, naproxen, or other                            non-steroidal anti-inflammatory drugs for 2 weeks.                           - Await pathology results.                           -  Repeat colonoscopy in 6-8 months for surveillance                            and potential role of Endorotor. Would recommend                            consideration of Intubation of patient and also                            pediatric colonoscope to see about potential                            retroflexion in the cecum though with the looping                            and spasm that occured it was helpful to have the                            adult colonoscope as well. Minimization of air                            insufflation will be key.                           - After discussion with patient decision made to                            try and use Anusol for hemorrhoidal disease burden                            and he will follow up with Dr. Havery Moros for next                            steps in evaluation.                           - The findings and recommendations were discussed                            with the patient.                           - The findings and recommendations were discussed                            with the designated responsible adult. Procedure Code(s):        --- Professional ---                           [id number], Colonoscopy, flexible; with endoscopic                            mucosal resection Diagnosis Code(s):        --- Professional ---  K64.1, Second degree hemorrhoids                           K63.5, Polyp of colon                           K57.30,  Diverticulosis of large intestine without                            perforation or abscess without bleeding CPT copyright 2019 American Medical Association. All rights reserved. The codes documented in this report are preliminary and upon coder review may  be revised to meet current compliance requirements. Justice Britain, MD 05/07/2020 2:39:34 PM Number of Addenda: 0

## 2020-05-07 NOTE — Anesthesia Postprocedure Evaluation (Signed)
Anesthesia Post Note  Patient: Jared Murray  Procedure(s) Performed: COLONOSCOPY WITH PROPOFOL (N/A ) ENDOSCOPIC MUCOSAL RESECTION (N/A ) POLYPECTOMY SUBMUCOSAL LIFTING INJECTION     Patient location during evaluation: Endoscopy Anesthesia Type: MAC Level of consciousness: awake and alert Pain management: pain level controlled Vital Signs Assessment: post-procedure vital signs reviewed and stable Respiratory status: spontaneous breathing, nonlabored ventilation, respiratory function stable and patient connected to nasal cannula oxygen Cardiovascular status: stable and blood pressure returned to baseline Postop Assessment: no apparent nausea or vomiting Anesthetic complications: no   No complications documented.  Last Vitals:  Vitals:   05/07/20 1445 05/07/20 1455  BP: (!) 148/102   Pulse: 81 74  Resp: 15 13  Temp:    SpO2: 99% 100%    Last Pain:  Vitals:   05/07/20 1445  TempSrc:   PainSc: 0-No pain                 Kelin Nixon S

## 2020-05-07 NOTE — H&P (Signed)
GASTROENTEROLOGY PROCEDURE H&P NOTE   Primary Care Physician: Charlott Rakes, MD  HPI: Jared Murray is a 44 y.o. male who presents for Colonoscopy completion due to poor preparation previously as well as potential EMR of IC Valve polyp.  Past Medical History:  Diagnosis Date   Acute pericarditis 03/31/2016   AKI (acute kidney injury) ([place]) 01/22/2015   Allergy    Anxiety    per pt   Arthritis    Avascular necrosis ([place]) 02/12/2015   Avascular necrosis of bone of hip ([place])    Chest wall abscess 04/03/2015   Chronic diarrhea of unknown origin 09/10/2017   Cutaneous abscess of chest wall    Depression    per pt   DKA (diabetic ketoacidoses) ([place])    DKA, type 1, not at goal [facility]) 04/03/2015   Eczema    Elevated liver enzymes 11/27/2018   GERD (gastroesophageal reflux disease)    HLD (hyperlipidemia) 11/25/2018   no meds   Hyperbilirubinemia    Hyperglycemia due to type 1 diabetes mellitus ([place])    Hyperkalemia    Hypokalemia 03/17/2013   Left hip pain 04/18/2014   Loss of weight 03/17/2013   Marijuana use 11/25/2018   Osteoarthritis 12/24/2016   Prolonged QT interval 11/27/2018   in setting of RBBB and hypokalemia   Protein-calorie malnutrition, severe ([place]) 03/17/2013   Psoriasis    RBBB 12/23/2018   Chronic since at least 2014   Sepsis ([place]) 01/22/2015   Substance abuse ([name])    Tobacco use disorder 09/13/2015   Type 1 diabetes mellitus ([place])    Past Surgical History:  Procedure Laterality Date   COLONOSCOPY  2020   FINGER SURGERY Right    middle finger   No current facility-administered medications for this encounter.   Allergies  Allergen Reactions   Bee Venom Swelling   Latex Itching and Rash   Penicillins Other (See Comments)    Childhood allergy Has patient had a PCN reaction causing immediate rash, facial/tongue/throat swelling, SOB or lightheadedness with hypotension: NO Has patient had a PCN reaction causing severe rash  involving mucus membranes or skin necrosis:NO Has patient had a PCN reaction that required hospitalization NO Has patient had a PCN reaction occurring within the last 10 years: NO If all of the above answers are "NO", then may proceed with Cephalosporin use.   Family History  Problem Relation Age of Onset   Hypertension Father    Diabetes Other        multiple   Lupus Cousin    Stroke Maternal Grandmother    Stroke Paternal Grandmother    Colon cancer Neg Hx    Stomach cancer Neg Hx    Rectal cancer Neg Hx    Esophageal cancer Neg Hx    Colon polyps Neg Hx    Social History   Socioeconomic History   Marital status: Married    Spouse name: Not on file   Number of children: Not on file   Years of education: Not on file   Highest education level: Not on file  Occupational History   Not on file  Tobacco Use   Smoking status: Former Smoker    Packs/day: 0.25    Years: 10.00    Pack years: 2.50    Types: Cigarettes    Quit date: 11/26/2018    Years since quitting: 1.4   Smokeless tobacco: Never Used  Vaping Use   Vaping Use: Never used  Substance and Sexual Activity  Alcohol use: Not Currently    Alcohol/week: 1.0 - 2.0 standard drink    Types: 1 - 2 Shots of liquor per week    Comment: none since 2020   Drug use: Yes    Types: Marijuana    Comment: last use marijuana 05/03/20   Sexual activity: Yes  Other Topics Concern   Not on file  Social History Narrative   Lives in [place] - works as a Education officer, museum   Married   Social Determinants of Radio broadcast assistant Strain:    Difficulty of Paying Living Expenses: Not on Art therapist Insecurity:    Worried About Charity fundraiser in the Last Year: Not on file   [employer] of Food in the Last Year: Not on file  Transportation Needs:    Film/video editor (Medical): Not on file   Lack of Transportation (Non-Medical): Not on file  Physical Activity:    Days of Exercise per Week:  Not on file   Minutes of Exercise per Session: Not on file  Stress:    Feeling of Stress : Not on file  Social Connections:    Frequency of Communication with Friends and Family: Not on file   Frequency of Social Gatherings with Friends and Family: Not on file   Attends Religious Services: Not on file   Active Member of Clubs or Organizations: Not on file   Attends Archivist Meetings: Not on file   Marital Status: Not on file  Intimate Partner Violence:    Fear of Current or Ex-Partner: Not on file   Emotionally Abused: Not on file   Physically Abused: Not on file   Sexually Abused: Not on file    Physical Exam: Vital signs in last 24 hours: Temp:  [98.6 F (37 C)] 98.6 F (37 C) (09/20 1159) Pulse Rate:  [80] 80 (09/20 1159) Resp:  [16] 16 (09/20 1159) BP: (128)/(92) 128/92 (09/20 1159) SpO2:  [99 %] 99 % (09/20 1159)   GEN: NAD EYE: Sclerae anicteric ENT: MMM CV: Non-tachycardic GI: Soft, NT/ND NEURO:  Alert & Oriented x 3  Lab Results: No results for input(s): WBC, HGB, HCT, PLT in the last 72 hours. BMET No results for input(s): NA, K, CL, CO2, GLUCOSE, BUN, CREATININE, CALCIUM in the last 72 hours. LFT No results for input(s): PROT, ALBUMIN, AST, ALT, ALKPHOS, BILITOT, BILIDIR, IBILI in the last 72 hours. PT/INR No results for input(s): LABPROT, INR in the last 72 hours.   Impression / Plan: This is a 44 y.o.male who presents for Colonoscopy completion due to poor preparation previously as well as potential EMR of IC Valve polyp.  The risks and benefits of endoscopic evaluation were discussed with the patient; these include but are not limited to the risk of perforation, infection, bleeding, missed lesions, lack of diagnosis, severe illness requiring hospitalization, as well as anesthesia and sedation related illnesses.  The patient is agreeable to proceed.    Justice Britain, MD [facility] Office #  [phone]

## 2020-05-07 NOTE — Anesthesia Procedure Notes (Signed)
Procedure Name: MAC Date/Time: 05/07/2020 1:15 PM Performed by: Amadeo Garnet, CRNA Pre-anesthesia Checklist: Patient identified, Emergency Drugs available, Suction available and Patient being monitored Patient Re-evaluated:Patient Re-evaluated prior to induction Oxygen Delivery Method: Simple face mask Preoxygenation: Pre-oxygenation with 100% oxygen Induction Type: IV induction Placement Confirmation: positive ETCO2 Dental Injury: Teeth and Oropharynx as per pre-operative assessment

## 2020-05-07 NOTE — Transfer of Care (Signed)
Immediate Anesthesia Transfer of Care Note  Patient: Jared Murray  Procedure(s) Performed: COLONOSCOPY WITH PROPOFOL (N/A ) ENDOSCOPIC MUCOSAL RESECTION (N/A ) POLYPECTOMY SUBMUCOSAL LIFTING INJECTION  Patient Location: Endoscopy Unit  Anesthesia Type:MAC  Level of Consciousness: awake, alert  and oriented  Airway & Oxygen Therapy: Patient Spontanous Breathing  Post-op Assessment: Report given to RN, Post -op Vital signs reviewed and stable and Patient moving all extremities  Post vital signs: Reviewed and stable  Last Vitals:  Vitals Value Taken Time  BP 148/102 05/07/20 1445  Temp 36.5 C 05/07/20 1440  Pulse 80 05/07/20 1446  Resp 15 05/07/20 1446  SpO2 100 % 05/07/20 1446  Vitals shown include unvalidated device data.  Last Pain:  Vitals:   05/07/20 1440  TempSrc: Temporal  PainSc: 0-No pain         Complications: No complications documented.

## 2020-05-07 NOTE — Anesthesia Preprocedure Evaluation (Addendum)
Anesthesia Evaluation  Patient identified by MRN, date of birth, ID band Patient awake    Reviewed: Patient's Chart, lab work & pertinent test results  Airway Mallampati: II  TM Distance: >3 FB Neck ROM: Full    Dental  (+) Edentulous Upper, Edentulous Lower   Pulmonary neg pulmonary ROS, former smoker,    Pulmonary exam normal        Cardiovascular  Rhythm:Regular Rate:Normal  Prolonged QT (04/20) in setting of hypokalemia and RBBB, chronic RBBB as of 2014   Neuro/Psych Anxiety Depression    GI/Hepatic Neg liver ROS, GERD  Medicated,History of colon polyps   Endo/Other  diabetes, Type 1, Insulin Dependent  Renal/GU Renal diseasenegative Renal ROS  negative genitourinary   Musculoskeletal  (+) Arthritis , AVN 2016 hip   Abdominal Normal abdominal exam  (+)  Abdomen: soft. Bowel sounds: normal.  Peds negative pediatric ROS (+)  Hematology negative hematology ROS (+)   Anesthesia Other Findings   Reproductive/Obstetrics                            Anesthesia Physical Anesthesia Plan  ASA: III  Anesthesia Plan: MAC   Post-op Pain Management:    Induction:   PONV Risk Score and Plan: Ondansetron and Dexamethasone  Airway Management Planned: Simple Face Mask and Nasal Cannula  Additional Equipment: None  Intra-op Plan:   Post-operative Plan:   Informed Consent: I have reviewed the patients History and Physical, chart, labs and discussed the procedure including the risks, benefits and alternatives for the proposed anesthesia with the patient or authorized representative who has indicated his/her understanding and acceptance.       Plan Discussed with: CRNA and Surgeon  Anesthesia Plan Comments: [phone] Nucleic Acid Test Results Lab Results      Component                Value               Date                      [place]              NEGATIVE            05/03/2020                 [place]                                  01/24/2020            RESULT: NEGATIVE)        Anesthesia Quick Evaluation

## 2020-05-07 NOTE — Discharge Instructions (Signed)

## 2020-05-08 LAB — GLUCOSE, CAPILLARY: Glucose-Capillary: 255 mg/dL — ABNORMAL HIGH (ref 70–99)

## 2020-05-09 ENCOUNTER — Encounter (HOSPITAL_COMMUNITY): Payer: Self-pay | Admitting: Gastroenterology

## 2020-05-10 LAB — SURGICAL PATHOLOGY

## 2020-05-16 ENCOUNTER — Other Ambulatory Visit: Payer: Self-pay

## 2020-05-16 ENCOUNTER — Encounter: Payer: Self-pay | Admitting: Family Medicine

## 2020-05-16 ENCOUNTER — Other Ambulatory Visit: Payer: Self-pay | Admitting: Family Medicine

## 2020-05-16 ENCOUNTER — Ambulatory Visit: Payer: Medicare Other | Attending: Family Medicine | Admitting: Family Medicine

## 2020-05-16 VITALS — BP 101/66 | HR 88 | Temp 98.2°F | Ht 70.0 in | Wt 133.2 lb

## 2020-05-16 DIAGNOSIS — E10649 Type 1 diabetes mellitus with hypoglycemia without coma: Secondary | ICD-10-CM | POA: Diagnosis not present

## 2020-05-16 DIAGNOSIS — E785 Hyperlipidemia, unspecified: Secondary | ICD-10-CM | POA: Insufficient documentation

## 2020-05-16 DIAGNOSIS — Z794 Long term (current) use of insulin: Secondary | ICD-10-CM | POA: Diagnosis not present

## 2020-05-16 DIAGNOSIS — Z880 Allergy status to penicillin: Secondary | ICD-10-CM | POA: Diagnosis not present

## 2020-05-16 DIAGNOSIS — Z7982 Long term (current) use of aspirin: Secondary | ICD-10-CM | POA: Diagnosis not present

## 2020-05-16 DIAGNOSIS — Z79899 Other long term (current) drug therapy: Secondary | ICD-10-CM | POA: Insufficient documentation

## 2020-05-16 DIAGNOSIS — Z87891 Personal history of nicotine dependence: Secondary | ICD-10-CM | POA: Diagnosis not present

## 2020-05-16 DIAGNOSIS — K219 Gastro-esophageal reflux disease without esophagitis: Secondary | ICD-10-CM | POA: Insufficient documentation

## 2020-05-16 DIAGNOSIS — L409 Psoriasis, unspecified: Secondary | ICD-10-CM | POA: Diagnosis not present

## 2020-05-16 DIAGNOSIS — Z91040 Latex allergy status: Secondary | ICD-10-CM | POA: Insufficient documentation

## 2020-05-16 DIAGNOSIS — Z91030 Bee allergy status: Secondary | ICD-10-CM | POA: Insufficient documentation

## 2020-05-16 DIAGNOSIS — Z8249 Family history of ischemic heart disease and other diseases of the circulatory system: Secondary | ICD-10-CM | POA: Diagnosis not present

## 2020-05-16 DIAGNOSIS — M1990 Unspecified osteoarthritis, unspecified site: Secondary | ICD-10-CM | POA: Insufficient documentation

## 2020-05-16 DIAGNOSIS — Z833 Family history of diabetes mellitus: Secondary | ICD-10-CM | POA: Insufficient documentation

## 2020-05-16 DIAGNOSIS — N528 Other male erectile dysfunction: Secondary | ICD-10-CM | POA: Diagnosis not present

## 2020-05-16 LAB — GLUCOSE, POCT (MANUAL RESULT ENTRY): POC Glucose: 332 mg/dl — AB (ref 70–99)

## 2020-05-16 LAB — POCT GLYCOSYLATED HEMOGLOBIN (HGB A1C): HbA1c, POC (controlled diabetic range): 11.6 % — AB (ref 0.0–7.0)

## 2020-05-16 MED ORDER — SILDENAFIL CITRATE 50 MG PO TABS: 50.0000 mg | ORAL_TABLET | Freq: Every day | ORAL | 1 refills | Status: DC | PRN

## 2020-05-16 MED ORDER — ATORVASTATIN CALCIUM 20 MG PO TABS: 20.0000 mg | ORAL_TABLET | Freq: Every day | ORAL | 6 refills | Status: DC

## 2020-05-16 MED FILL — ATORVASTATIN CALCIUM 20 MG: 20 | 30 days supply | Qty: 30 | Fill #0

## 2020-05-16 NOTE — Progress Notes (Signed)
Would like refill on Viagra.

## 2020-05-16 NOTE — Progress Notes (Signed)
Subjective:  Patient ID: Jared Murray, male    DOB: 05/22/1976  Age: 44 y.o. MRN: [id number]  CC: Diabetes   HPI Jared Murray is a 44 year old male with a history of type 1 diabetes mellitus (A1c 11.6), avascular necrosis of both hips, GERD, and psoriasis who presents today for a follow up visit.   He had polyps removed this month and he denies presence of bleeding.  Pathology was negative for malignancy or dysplasia. The chronic diarrhea he previously had has resolved  He was seen by endocrine at [facility] in 03/2020 and has a follow-up coming up in 2 months. Trying to obtain CGM through the assistance of endocrine department.  He endorses compliance with his medications. His psoriasis is stable and he is currently using his topical steroids prescribed by dermatology. Requests refill of Viagra.  Past Medical History:  Diagnosis Date  . Acute pericarditis 03/31/2016  . AKI (acute kidney injury) ([place]) 01/22/2015  . Allergy   . Anxiety    per pt  . Arthritis   . Avascular necrosis ([place]) 02/12/2015  . Avascular necrosis of bone of hip ([place])   . Chest wall abscess 04/03/2015  . Chronic diarrhea of unknown origin 09/10/2017  . Cutaneous abscess of chest wall   . Depression    per pt  . DKA (diabetic ketoacidoses) ([place])   . DKA, type 1, not at goal [facility]) 04/03/2015  . Eczema   . Elevated liver enzymes 11/27/2018  . GERD (gastroesophageal reflux disease)   . HLD (hyperlipidemia) 11/25/2018   no meds  . Hyperbilirubinemia   . Hyperglycemia due to type 1 diabetes mellitus ([place])   . Hyperkalemia   . Hypokalemia 03/17/2013  . Left hip pain 04/18/2014  . Loss of weight 03/17/2013  . Marijuana use 11/25/2018  . Osteoarthritis 12/24/2016  . Prolonged QT interval 11/27/2018   in setting of RBBB and hypokalemia  . Protein-calorie malnutrition, severe ([place]) 03/17/2013  . Psoriasis   . RBBB 12/23/2018   Chronic since at least 2014  . Sepsis ([place]) 01/22/2015  . Substance abuse ([place])     . Tobacco use disorder 09/13/2015  . Type 1 diabetes mellitus ([place])     Past Surgical History:  Procedure Laterality Date  . COLONOSCOPY  2020  . COLONOSCOPY WITH PROPOFOL N/A 05/07/2020   Procedure: COLONOSCOPY WITH PROPOFOL;  Surgeon: Rush Landmark Telford Nab., MD;  Location: [facility];  Service: Gastroenterology;  Laterality: N/A;  . ENDOSCOPIC MUCOSAL RESECTION N/A 05/07/2020   Procedure: ENDOSCOPIC MUCOSAL RESECTION;  Surgeon: Rush Landmark Telford Nab., MD;  Location: [place];  Service: Gastroenterology;  Laterality: N/A;  . FINGER SURGERY Right    middle finger  . SUBMUCOSAL LIFTING INJECTION  05/07/2020   Procedure: SUBMUCOSAL LIFTING INJECTION;  Surgeon: Rush Landmark Telford Nab., MD;  [facility];  Service: Gastroenterology;;    Family History  Problem Relation Age of Onset  . Hypertension Father   . Diabetes Other        multiple  . Lupus Cousin   . Stroke Maternal Grandmother   . Stroke Paternal Grandmother   . Colon cancer Neg Hx   . Stomach cancer Neg Hx   . Rectal cancer Neg Hx   . Esophageal cancer Neg Hx   . Colon polyps Neg Hx     Allergies  Allergen Reactions  . Bee Venom Swelling  . Latex Itching and Rash  . Penicillins Other (See Comments)    Childhood allergy Has patient had a  PCN reaction causing immediate rash, facial/tongue/throat swelling, SOB or lightheadedness with hypotension: NO Has patient had a PCN reaction causing severe rash involving mucus membranes or skin necrosis:NO Has patient had a PCN reaction that required hospitalization NO Has patient had a PCN reaction occurring within the last 10 years: NO If all of the above answers are "NO", then may proceed with Cephalosporin use.    Outpatient Medications Prior to Visit  Medication Sig Dispense Refill  . Accu-Chek Softclix Lancets lancets Use as instructed 3 times daily. Dx: Type 1 DM 120 each 5  . Blood Glucose Monitoring Suppl (ACCU-CHEK AVIVA PLUS) w/Device KIT TEST 3 TIMES  DAILY 1 kit 0  . clobetasol cream (TEMOVATE) 6.25 % Apply 1 application topically 2 (two) times daily as needed (rash).     . fluocinonide (LIDEX) 0.05 % external solution Apply 1 application topically daily as needed (psoriasis).     Marland Kitchen glucose blood (ACCU-CHEK AVIVA PLUS) test strip USE TO CHECK BLOOD SUGAR FOUR TIMES DAILY BEFORE MEALS AND AT BEDTIME 120 each 11  . hydrocortisone (ANUSOL-HC) 25 MG suppository Place 1 suppository (25 mg total) rectally at bedtime. Nightly for 1 week and then every other night until suppositories are completed (12 total). 12 suppository 0  . insulin aspart (NOVOLOG) 100 UNIT/ML injection INJECT 0-12 UNITS INTO THE SKIN THREE TIMES DAILY WITH MEALS AS PER SLIDING SCALE (Patient taking differently: Inject 2-10 Units into the skin 3 (three) times daily as needed (blood sugar over 120). ) 10 mL 2  . Insulin Glargine (BASAGLAR KWIKPEN) 100 UNIT/ML Inject 0.12 mLs (12 Units total) into the skin daily. 15 mL 1  . Insulin Syringe-Needle U-100 (B-D INS SYRINGE 2CC/29GX1/2") 29G X 1/2" 2 ML MISC Check Blood sugar TID and QHS 100 each 12  . pantoprazole (PROTONIX) 40 MG tablet Take 1 tablet (40 mg total) by mouth daily. (Patient taking differently: Take 40 mg by mouth daily as needed (acid reflux). ) 30 tablet 3  . Tetrahydrozoline HCl (VISINE OP) Place 1 drop into both eyes daily as needed (dryness / allergies).    . sildenafil (VIAGRA) 50 MG tablet Take 1 tablet (50 mg total) by mouth daily as needed for erectile dysfunction (At least 24 hours between doses). 10 tablet 1  . aspirin-sod bicarb-citric acid (ALKA-SELTZER) 325 MG TBEF tablet Take 325 mg by mouth every 6 (six) hours as needed (heartburn). (Patient not taking: Reported on 05/16/2020)     No facility-administered medications prior to visit.     ROS Review of Systems  Constitutional: Negative for activity change and appetite change.  HENT: Negative for sinus pressure and sore throat.   Eyes: Negative for visual  disturbance.  Respiratory: Negative for cough, chest tightness and shortness of breath.   Cardiovascular: Negative for chest pain and leg swelling.  Gastrointestinal: Negative for abdominal distention, abdominal pain, constipation and diarrhea.  Endocrine: Negative.   Genitourinary: Negative for dysuria.  Musculoskeletal: Positive for myalgias. Negative for joint swelling.  Skin: Positive for rash.  Allergic/Immunologic: Negative.   Neurological: Negative for weakness, light-headedness and numbness.  Psychiatric/Behavioral: Negative for dysphoric mood and suicidal ideas.    Objective:  BP 101/66   Pulse 88   Temp 98.2 F (36.8 C) (Oral)   Ht '5\' 10"'  (1.778 m)   Wt 133 lb 3.2 oz (60.4 kg)   SpO2 100%   BMI 19.11 kg/m   BP/Weight 05/16/2020 05/07/2020 6/38/9373  Systolic BP 428 768 -  Diastolic BP 66 91 -  Wt. (Lbs) 133.2 - 130  BMI 19.11 18.65 -      Physical Exam Constitutional:      Appearance: He is well-developed.  Neck:     Vascular: No JVD.  Cardiovascular:     Rate and Rhythm: Normal rate.     Heart sounds: Normal heart sounds. No murmur heard.   Pulmonary:     Effort: Pulmonary effort is normal.     Breath sounds: Normal breath sounds. No wheezing or rales.  Chest:     Chest wall: No tenderness.  Abdominal:     General: Bowel sounds are normal. There is no distension.     Palpations: Abdomen is soft. There is no mass.     Tenderness: There is no abdominal tenderness.  Musculoskeletal:        General: Normal range of motion.     Right lower leg: No edema.     Left lower leg: No edema.  Skin:    Comments: Psoriatic rash in different parts of the body.  Neurological:     Mental Status: He is alert and oriented to person, place, and time.  Psychiatric:        Mood and Affect: Mood normal.     CMP Latest Ref Rng & Units 02/14/2020 12/30/2019 08/02/2019  Glucose 65 - 99 mg/dL 62(L) - 141(H)  BUN 6 - 24 mg/dL 7 - 4(L)  Creatinine 0.76 - 1.27 mg/dL 0.90 -  0.68(L)  Sodium 134 - 144 mmol/L 140 - 139  Potassium 3.5 - 5.2 mmol/L 3.6 - 3.8  Chloride 96 - 106 mmol/L 100 - 101  CO2 20 - 29 mmol/L 26 - 26  Calcium 8.7 - 10.2 mg/dL 9.4 - 9.3  Total Protein 6.0 - 8.3 g/dL - 6.8 -  Total Bilirubin 0.2 - 1.2 mg/dL - 0.8 -  Alkaline Phos 39 - 117 U/L - 119(H) -  AST 0 - 37 U/L - 28 -  ALT 0 - 53 U/L - 28 -    Lipid Panel     Component Value Date/Time   CHOL 123 02/14/2020 1052   TRIG 39 02/14/2020 1052   HDL 45 02/14/2020 1052   CHOLHDL 1.6 10/19/2018 1045   CHOLHDL 2.3 12/19/2015 0947   VLDL 12 12/19/2015 0947   LDLCALC 68 02/14/2020 1052    CBC    Component Value Date/Time   WBC 5.0 12/30/2019 1651   RBC 3.99 (L) 12/30/2019 1651   HGB 11.6 (L) 12/30/2019 1651   HGB 12.4 (L) 12/16/2018 1444   HCT 34.6 (L) 12/30/2019 1651   HCT 36.8 (L) 12/16/2018 1444   PLT 376 12/30/2019 1651   PLT 299 12/16/2018 1444   MCV 86.7 12/30/2019 1651   MCV 89 12/16/2018 1444   MCH 29.1 12/30/2019 1651   MCHC 33.5 12/30/2019 1651   RDW 13.1 12/30/2019 1651   RDW 13.0 12/16/2018 1444   LYMPHSABS 2,095 12/30/2019 1651   LYMPHSABS 2.1 12/16/2018 1444   MONOABS 0.4 11/25/2018 0352   EOSABS 170 12/30/2019 1651   EOSABS 0.2 12/16/2018 1444   BASOSABS 50 12/30/2019 1651   BASOSABS 0.0 12/16/2018 1444    Lab Results  Component Value Date   HGBA1C 11.6 (A) 05/16/2020    Assessment & Plan:  1. Type 1 diabetes mellitus with hypoglycemia and without coma ([place]) Uncontrolled with A1c of 11.6; goal is less than 7.0 He has had labile sugars hence I will defer to his endocrinologist for adjustment of his sugars Counseled on  Diabetic diet, my plate method, 941 minutes of moderate intensity exercise/week Blood sugar logs with fasting goals of 80-120 mg/dl, random of less than 180 and in the event of sugars less than 60 mg/dl or greater than 400 mg/dl encouraged to notify the clinic. Advised on the need for annual eye exams, annual foot exams, Pneumonia  vaccine. - POCT glucose (manual entry) - POCT glycosylated hemoglobin (Hb A1C) - atorvastatin (LIPITOR) 20 MG tablet; Take 1 tablet (20 mg total) by mouth daily.  Dispense: 30 tablet; Refill: 6  2. Other male erectile dysfunction - sildenafil (VIAGRA) 50 MG tablet; Take 1 tablet (50 mg total) by mouth daily as needed for erectile dysfunction (At least 24 hours between doses).  Dispense: 10 tablet; Refill: 1  3. Psoriasis Stable Continue topical steroid     Meds ordered this encounter  Medications  . sildenafil (VIAGRA) 50 MG tablet    Sig: Take 1 tablet (50 mg total) by mouth daily as needed for erectile dysfunction (At least 24 hours between doses).    Dispense:  10 tablet    Refill:  1  . atorvastatin (LIPITOR) 20 MG tablet    Sig: Take 1 tablet (20 mg total) by mouth daily.    Dispense:  30 tablet    Refill:  6    Follow-up: Return in about 6 months (around 11/13/2020) for chronic disease management.       Charlott Rakes, MD, FAAFP. [facility] and [place], [place]   05/16/2020, 1:10 PM

## 2020-05-20 ENCOUNTER — Telehealth: Payer: Self-pay | Admitting: Gastroenterology

## 2020-05-20 NOTE — Telephone Encounter (Signed)
I called and try to speak with the patient on 05/18/2020. Patient's wife and answered. Unfortunately husband was away and does not have any access to any phone. She asked that I try to call the patient verbally and the day before 8:00 or after 6:00. I did not go over the results with the patient's wife. I will try to reach the patient early next week with the results.  FYI Patty in case the patient calls please obtain a good phone number.  GM

## 2020-05-21 ENCOUNTER — Encounter: Payer: Self-pay | Admitting: Gastroenterology

## 2020-05-21 DIAGNOSIS — E108 Type 1 diabetes mellitus with unspecified complications: Secondary | ICD-10-CM | POA: Diagnosis not present

## 2020-05-21 DIAGNOSIS — E109 Type 1 diabetes mellitus without complications: Secondary | ICD-10-CM | POA: Diagnosis not present

## 2020-05-21 MED FILL — GVOKE HYPOPEN 1-PACK 1 MG/0: 1 | 10 days supply | Qty: 0 | Fill #0

## 2020-05-21 NOTE — Telephone Encounter (Signed)
The recall Colon EMR has been added for 1 year

## 2020-05-21 NOTE — Telephone Encounter (Signed)
Gabe thanks very much

## 2020-05-21 NOTE — Telephone Encounter (Signed)
I was able to talk with the patient this morning. Interestingly, in the entire resection specimen of the TI and the ICV there was no evidence of adenomatous tissue. With this being said, his prior pathology did show adenoma. I would recommend a 1-year follow up colonoscopy and we can do further work on the area if necessary otherwise will go back to normal surveillance recommendations. I will forward this to primary GI Dr. Havery Moros. Patty, please place 1 year Colonoscopy with EMR with similar preparation as per this most recent Colonoscopy. Thank you. GM

## 2020-05-23 MED FILL — ATORVASTATIN CALCIUM 20 MG: 20 | 30 days supply | Qty: 30 | Fill #0

## 2020-06-08 MED FILL — NovoLOG 100 UNIT/ML SOLN: 100 | 27 days supply | Qty: 10 | Fill #0

## 2020-06-12 ENCOUNTER — Other Ambulatory Visit: Payer: Self-pay | Admitting: Family Medicine

## 2020-06-12 NOTE — Telephone Encounter (Signed)
Medication Refill - Medication: clobetasol cream (TEMOVATE) 0.05 %    Has the patient contacted their pharmacy? Yes.   (Agent: If no, request that the patient contact the pharmacy for the refill.) (Agent: If yes, when and what did the pharmacy advise?)  Preferred Pharmacy (with phone number or street name):  [facility], [address]  [place] [address]  Phone: [phone] Fax: [phone]     Agent: Please be advised that RX refills may take up to 3 business days. We ask that you follow-up with your pharmacy.

## 2020-06-12 NOTE — Telephone Encounter (Signed)
Requested medication (s) are due for refill today: yes  Requested medication (s) are on the active medication list: yes  Last refill:  ?  Future visit scheduled: yes  Notes to clinic:  historical provider    Requested Prescriptions  Pending Prescriptions Disp Refills   clobetasol cream (TEMOVATE) 0.05 % 30 g     Sig: Apply 1 application topically 2 (two) times daily as needed (rash).      Dermatology:  Corticosteroids Passed - 06/12/2020  1:38 PM      Passed - Valid encounter within last 12 months    Recent Outpatient Visits           3 weeks ago Type 1 diabetes mellitus with hypoglycemia and without coma ([place])   [place], [place], MD   3 months ago Type 1 diabetes mellitus with hypoglycemia and without coma ([place])   [place], Charlane Ferretti, MD   7 months ago Type 1 diabetes mellitus with hyperglycemia [facility])   [place], Charlane Ferretti, MD   10 months ago Type 1 diabetes mellitus with other neurologic complication [facility])   [place], [place], MD   1 year ago Avascular necrosis [facility])   [place], MD       Future Appointments             In 5 months Charlott Rakes, MD [place]

## 2020-06-15 ENCOUNTER — Other Ambulatory Visit: Payer: Self-pay | Admitting: Family Medicine

## 2020-06-15 MED ORDER — CLOBETASOL PROPIONATE 0.05 % EX CREA: 1.0000 "application " | TOPICAL_CREAM | Freq: Two times a day (BID) | CUTANEOUS | 0 refills | Status: DC | PRN

## 2020-06-15 MED FILL — CLOBETASOL PROPIONATE 0.05: 0.05 | 15 days supply | Qty: 30 | Fill #0

## 2020-07-24 DIAGNOSIS — E108 Type 1 diabetes mellitus with unspecified complications: Secondary | ICD-10-CM | POA: Diagnosis not present

## 2020-07-24 DIAGNOSIS — M87852 Other osteonecrosis, left femur: Secondary | ICD-10-CM | POA: Diagnosis not present

## 2020-07-24 DIAGNOSIS — M1612 Unilateral primary osteoarthritis, left hip: Secondary | ICD-10-CM | POA: Diagnosis not present

## 2020-07-24 DIAGNOSIS — Z79899 Other long term (current) drug therapy: Secondary | ICD-10-CM | POA: Diagnosis not present

## 2020-07-24 DIAGNOSIS — Z560 Unemployment, unspecified: Secondary | ICD-10-CM | POA: Diagnosis not present

## 2020-07-24 DIAGNOSIS — L309 Dermatitis, unspecified: Secondary | ICD-10-CM | POA: Diagnosis not present

## 2020-07-24 DIAGNOSIS — F32A Depression, unspecified: Secondary | ICD-10-CM | POA: Diagnosis not present

## 2020-07-24 DIAGNOSIS — Z833 Family history of diabetes mellitus: Secondary | ICD-10-CM | POA: Diagnosis not present

## 2020-07-24 DIAGNOSIS — M25559 Pain in unspecified hip: Secondary | ICD-10-CM | POA: Diagnosis not present

## 2020-07-24 DIAGNOSIS — Z880 Allergy status to penicillin: Secondary | ICD-10-CM | POA: Diagnosis not present

## 2020-07-24 DIAGNOSIS — Z91040 Latex allergy status: Secondary | ICD-10-CM | POA: Diagnosis not present

## 2020-07-24 DIAGNOSIS — E109 Type 1 diabetes mellitus without complications: Secondary | ICD-10-CM | POA: Diagnosis not present

## 2020-07-24 DIAGNOSIS — Z794 Long term (current) use of insulin: Secondary | ICD-10-CM | POA: Diagnosis not present

## 2020-07-24 DIAGNOSIS — G4700 Insomnia, unspecified: Secondary | ICD-10-CM | POA: Diagnosis not present

## 2020-07-24 DIAGNOSIS — G709 Myoneural disorder, unspecified: Secondary | ICD-10-CM | POA: Diagnosis not present

## 2020-07-24 DIAGNOSIS — K219 Gastro-esophageal reflux disease without esophagitis: Secondary | ICD-10-CM | POA: Diagnosis not present

## 2020-07-24 DIAGNOSIS — M87851 Other osteonecrosis, right femur: Secondary | ICD-10-CM | POA: Diagnosis not present

## 2020-07-27 MED FILL — PANTOPRAZOLE SOD DR 40 MG T: 40 | 90 days supply | Qty: 90 | Fill #0

## 2020-07-27 MED FILL — BASAGLAR 100 UNIT/ML KWIKPE: 100 | 125 days supply | Qty: 15 | Fill #1

## 2020-07-27 MED FILL — ATORVASTATIN CALCIUM 20 MG: 20 | 90 days supply | Qty: 90 | Fill #1

## 2020-07-27 MED FILL — CLOBETASOL PROPIONATE 0.05: 0.05 | 15 days supply | Qty: 30 | Fill #0

## 2020-08-02 DIAGNOSIS — L989 Disorder of the skin and subcutaneous tissue, unspecified: Secondary | ICD-10-CM | POA: Diagnosis not present

## 2020-08-24 ENCOUNTER — Other Ambulatory Visit: Payer: Self-pay | Admitting: Family Medicine

## 2020-08-24 DIAGNOSIS — E1065 Type 1 diabetes mellitus with hyperglycemia: Secondary | ICD-10-CM

## 2020-08-24 MED ORDER — INSULIN ASPART 100 UNIT/ML ~~LOC~~ SOLN: SUBCUTANEOUS | 2 refills | Status: DC

## 2020-08-24 MED FILL — NovoLOG 100 UNIT/ML SOLN: 100 | 27 days supply | Qty: 10 | Fill #0

## 2020-08-24 NOTE — Telephone Encounter (Signed)
Medication: insulin aspart (NOVOLOG) 100 UNIT/ML injection [[id number]]   Has the patient contacted their pharmacy? YES  (Agent: If no, request that the patient contact the pharmacy for the refill.) (Agent: If yes, when and what did the pharmacy advise?)  Preferred Pharmacy (with phone number or street name): [facility], [address] [place] [address] Phone: [phone] Fax: [phone] Hours: Not open 24 hours    Agent: Please be advised that RX refills may take up to 3 business days. We ask that you follow-up with your pharmacy.

## 2020-08-28 DIAGNOSIS — E109 Type 1 diabetes mellitus without complications: Secondary | ICD-10-CM | POA: Diagnosis not present

## 2020-10-30 DIAGNOSIS — E108 Type 1 diabetes mellitus with unspecified complications: Secondary | ICD-10-CM | POA: Diagnosis not present

## 2020-10-30 DIAGNOSIS — Z794 Long term (current) use of insulin: Secondary | ICD-10-CM | POA: Diagnosis not present

## 2020-11-02 ENCOUNTER — Other Ambulatory Visit: Payer: Self-pay | Admitting: Family Medicine

## 2020-11-02 DIAGNOSIS — E10649 Type 1 diabetes mellitus with hypoglycemia without coma: Secondary | ICD-10-CM

## 2020-11-02 MED ORDER — BASAGLAR KWIKPEN 100 UNIT/ML ~~LOC~~ SOPN: 12.0000 [IU] | PEN_INJECTOR | Freq: Every day | SUBCUTANEOUS | 1 refills | Status: DC

## 2020-11-02 NOTE — Telephone Encounter (Signed)
Medication: insulin aspart (NOVOLOG) 100 UNIT/ML injection [[id number]] , Insulin Glargine (BASAGLAR KWIKPEN) 100 UNIT/ML [[id number]]  ENDED  Has the patient contacted their pharmacy? YES (Agent: If no, request that the patient contact the pharmacy for the refill.) (Agent: If yes, when and what did the pharmacy advise?)  Preferred Pharmacy (with phone number or street name): [place], [place] [address]  [place], [place] [employer] [place] [address]  Phone:  [phone] Fax:  [phone]   Agent: Please be advised that RX refills may take up to 3 business days. We ask that you follow-up with your pharmacy.

## 2020-11-02 NOTE — Telephone Encounter (Signed)
Requested medication (s) are due for refill today:   Yes  Requested medication (s) are on the active medication list:   Yes  Future visit scheduled:   Yes in 1 mo. With Dr. Margarita Rana   Last ordered: 02/14/2020 15 ml, 1 refill  Clinic note:  Returned because the recommended amount pharmacy wants it ordered at is higher than what is usually ordered.   It won't let me reorder.   Requested Prescriptions  Pending Prescriptions Disp Refills   Insulin Glargine (BASAGLAR KWIKPEN) 100 UNIT/ML 15 mL 1    Sig: Inject 12 Units into the skin daily.      Endocrinology:  Diabetes - Insulins Failed - 11/02/2020 12:37 PM      Failed - HBA1C is between 0 and 7.9 and within 180 days    HbA1c, POC (controlled diabetic range)  Date Value Ref Range Status  05/16/2020 11.6 (A) 0.0 - 7.0 % Final          Passed - Valid encounter within last 6 months    Recent Outpatient Visits           5 months ago Type 1 diabetes mellitus with hypoglycemia and without coma ([place])   [place], [place], MD   8 months ago Type 1 diabetes mellitus with hypoglycemia and without coma ([place])   [place], [place], MD   11 months ago Type 1 diabetes mellitus with hyperglycemia ([place])   [place], Charlane Ferretti, MD   1 year ago Type 1 diabetes mellitus with other neurologic complication [facility])   [place], [place]ong, MD   1 year ago Avascular necrosis [facility])   [place], MD       Future Appointments             In 1 month Charlott Rakes, MD [place]

## 2020-11-09 ENCOUNTER — Other Ambulatory Visit: Payer: Self-pay | Admitting: Family Medicine

## 2020-11-09 MED FILL — CLOBETASOL PROPIONATE 0.05: 0.05 | 15 days supply | Qty: 30 | Fill #0

## 2020-11-19 ENCOUNTER — Ambulatory Visit: Payer: Medicare Other | Admitting: Family Medicine

## 2020-12-18 ENCOUNTER — Ambulatory Visit: Payer: Medicare Other | Attending: Family Medicine | Admitting: Family Medicine

## 2020-12-18 ENCOUNTER — Encounter: Payer: Self-pay | Admitting: Family Medicine

## 2020-12-18 ENCOUNTER — Other Ambulatory Visit: Payer: Self-pay

## 2020-12-18 VITALS — BP 104/67 | HR 87 | Ht 70.0 in | Wt 128.0 lb

## 2020-12-18 DIAGNOSIS — M90551 Osteonecrosis in diseases classified elsewhere, right thigh: Secondary | ICD-10-CM | POA: Insufficient documentation

## 2020-12-18 DIAGNOSIS — L409 Psoriasis, unspecified: Secondary | ICD-10-CM

## 2020-12-18 DIAGNOSIS — Z713 Dietary counseling and surveillance: Secondary | ICD-10-CM | POA: Insufficient documentation

## 2020-12-18 DIAGNOSIS — E10649 Type 1 diabetes mellitus with hypoglycemia without coma: Secondary | ICD-10-CM | POA: Diagnosis not present

## 2020-12-18 DIAGNOSIS — E1042 Type 1 diabetes mellitus with diabetic polyneuropathy: Secondary | ICD-10-CM | POA: Diagnosis not present

## 2020-12-18 DIAGNOSIS — E1065 Type 1 diabetes mellitus with hyperglycemia: Secondary | ICD-10-CM | POA: Insufficient documentation

## 2020-12-18 DIAGNOSIS — Z7982 Long term (current) use of aspirin: Secondary | ICD-10-CM | POA: Insufficient documentation

## 2020-12-18 DIAGNOSIS — Z880 Allergy status to penicillin: Secondary | ICD-10-CM | POA: Insufficient documentation

## 2020-12-18 DIAGNOSIS — M8700 Idiopathic aseptic necrosis of unspecified bone: Secondary | ICD-10-CM

## 2020-12-18 DIAGNOSIS — Z833 Family history of diabetes mellitus: Secondary | ICD-10-CM | POA: Insufficient documentation

## 2020-12-18 DIAGNOSIS — K219 Gastro-esophageal reflux disease without esophagitis: Secondary | ICD-10-CM | POA: Diagnosis not present

## 2020-12-18 DIAGNOSIS — Z794 Long term (current) use of insulin: Secondary | ICD-10-CM | POA: Diagnosis not present

## 2020-12-18 DIAGNOSIS — L659 Nonscarring hair loss, unspecified: Secondary | ICD-10-CM | POA: Diagnosis not present

## 2020-12-18 DIAGNOSIS — K529 Noninfective gastroenteritis and colitis, unspecified: Secondary | ICD-10-CM

## 2020-12-18 DIAGNOSIS — Z7182 Exercise counseling: Secondary | ICD-10-CM | POA: Diagnosis not present

## 2020-12-18 DIAGNOSIS — I96 Gangrene, not elsewhere classified: Secondary | ICD-10-CM | POA: Insufficient documentation

## 2020-12-18 DIAGNOSIS — Z7984 Long term (current) use of oral hypoglycemic drugs: Secondary | ICD-10-CM | POA: Insufficient documentation

## 2020-12-18 DIAGNOSIS — M90552 Osteonecrosis in diseases classified elsewhere, left thigh: Secondary | ICD-10-CM | POA: Insufficient documentation

## 2020-12-18 DIAGNOSIS — Z79899 Other long term (current) drug therapy: Secondary | ICD-10-CM | POA: Insufficient documentation

## 2020-12-18 DIAGNOSIS — Z8249 Family history of ischemic heart disease and other diseases of the circulatory system: Secondary | ICD-10-CM | POA: Diagnosis not present

## 2020-12-18 LAB — GLUCOSE, POCT (MANUAL RESULT ENTRY): POC Glucose: 246 mg/dl — AB (ref 70–99)

## 2020-12-18 LAB — POCT GLYCOSYLATED HEMOGLOBIN (HGB A1C): HbA1c, POC (controlled diabetic range): 10.6 % — AB (ref 0.0–7.0)

## 2020-12-18 MED ORDER — DICYCLOMINE HCL 10 MG PO CAPS
10.0000 mg | ORAL_CAPSULE | Freq: Two times a day (BID) | ORAL | 2 refills | Status: DC | PRN
Start: 2020-12-18 — End: 2021-06-20
  Filled 2020-12-18: qty 60, 30d supply, fill #0
  Filled 2021-02-15: qty 60, 30d supply, fill #1
  Filled 2021-04-23: qty 60, 30d supply, fill #2

## 2020-12-18 NOTE — Patient Instructions (Signed)
Psoriasis Psoriasis is a long-term (chronic) skin condition. It occurs because your immune system causes skin cells to form too quickly. As a result, too many skin cells grow and create raised, red patches (plaques) that often look silvery on your skin. Plaques may show up anywhere on your body. They can be any size or shape. Symptoms of this condition range from mild to very severe. Psoriasis cannot be passed from one person to another (is not contagious). Sometimes, the symptoms go away and then come back again. What are the causes? The cause of psoriasis is not known, but certain factors can make the condition worse. These include:  Damage or trauma to the skin, such as cuts, scrapes, sunburn, and dryness.  Not enough exposure to sunlight.  Certain medicines.  Alcohol.  Tobacco use.  Stress.  Infections caused by bacteria or viruses. What increases the risk? You are more likely to develop this condition if you:  Have a family history of psoriasis.  Are obese.  Are 45-8 years old.  Are taking certain medicines. What are the signs or symptoms? There are different types of psoriasis. You can have more than one type of psoriasis during your life. The types are:  Plaque. This is the most common.  Guttate. This is also called eruptive psoriasis.  Inverse.  Pustular.  Erythrodermic.  Sebopsoriasis.  Psoriatic arthritis. Each type of psoriasis has different symptoms.  Plaque psoriasis symptoms include red, raised plaques with a silvery-white coating (scale). These plaques may be itchy. Your nails may be pitted and crumbly or fall off.  Guttate psoriasis symptoms include small red spots that often show up on your trunk, arms, and legs. These spots may develop after you have been sick, especially with strep throat.  Inverse psoriasis symptoms include plaques in your underarm area, under your breasts, or on your genitals, groin, or buttocks.  Pustular psoriasis symptoms  include pus-filled bumps that are painful, red, and swollen on the palms of your hands or the soles of your feet. You also may feel exhausted, feverish, weak, or have no appetite.  Erythrodermic psoriasis symptoms include bright red skin that may look burned. You may have a fast heartbeat and a body temperature that is too high or too low. You may be itchy or in pain.  Sebopsoriasis symptoms include red plaques that have a greasy coating, and are often on your scalp, forehead, and face.  Psoriatic arthritis causes swollen, painful joints along with scaly skin plaques.   How is this diagnosed? This condition is diagnosed based on your symptoms, family history, and a physical exam.  You may also be referred to a health care provider who specializes in skin diseases (dermatologist).  Your health care provider may remove a tissue sample (biopsy) for testing. How is this treated? There is no cure for this condition, but treatment can help manage it. Goals of treatment include:  Helping your skin heal.  Reducing itching and inflammation.  Slowing the growth of new skin cells.  Helping your immune system respond better to your skin. Treatment varies, depending on the severity of your condition. This condition may be treated by:  Creams or ointments to help with symptoms.  Ultraviolet ray exposure (light therapy or phototherapy). This may include natural sunlight or light therapy in a medical office.  Medicines (systemic therapy). These medicines can help your body better manage skin cell turnover and inflammation. Medicines may be given in the form of pills or injections. They may be used along  with light therapy or ointments. You may also get antibiotic medicines if you have an infection. Follow these instructions at home: [place] your skin as needed. Only use moisturizers that have been approved by your health care provider.  Apply cool, wet cloths (cold compresses) to the  affected areas.  Do not use a hot tub or take hot showers. Take lukewarm showers and baths.  Do not scratch your skin. Lifestyle  Do not use any products that contain nicotine or tobacco, such as cigarettes, e-cigarettes, and chewing tobacco. If you need help quitting, ask your health care provider.  Use techniques for stress reduction, such as meditation or yoga.  Maintain a healthy weight. Follow instructions from your health care provider for weight control. These may include dietary restrictions.  Get safe exposure to the sun as told by your health care provider. This may include spending regular intervals of time outdoors in sunlight. Do not get sunburned.  Consider joining a psoriasis support group.   Medicines  Take or use over-the-counter and prescription medicines only as told by your health care provider.  If you were prescribed an antibiotic medicine, take it as told by your health care provider. Do not stop using the antibiotic even if you start to feel better. Alcohol use  Limit how much you use: ? 0-1 drink a day for women. ? 0-2 drinks a day for men.  Be aware of how much alcohol is in your drink. In the [place] one drink equals one 12 oz bottle of beer (355 mL), one 5 oz glass of wine (148 mL), or one 1 oz glass of hard liquor (44 mL). General instructions  Keep a journal to help track what triggers an outbreak. Try to avoid any triggers.  See a counselor if feelings of sadness, frustration, and hopelessness about your condition are interfering with your work and relationships.  Keep all follow-up visits as told by your health care provider. This is important. Contact a health care provider if:  You have a fever.  Your pain gets worse.  You have increasing redness or warmth in the affected areas.  You have new or worsening pain or stiffness in your joints.  Your nails start to break easily or pull away from the nail bed.  You feel  depressed. Summary  Psoriasis is a long-term (chronic) skin condition. Patches (plaques) may show up anywhere on your body.  There is no cure for this condition, but treatment can help manage it. Treatment varies, depending on the severity of your condition.  Keep a journal to track what triggers an outbreak. Try to avoid any triggers.  Take or use over-the-counter and prescription medicines only as told by your health care provider.  Keep all follow-up visits as told by your health care provider. This is important. This information is not intended to replace advice given to you by your health care provider. Make sure you discuss any questions you have with your health care provider. Document Revised: 06/08/2018 Document Reviewed: 06/08/2018 Elsevier Patient Education  2021 [employer].

## 2020-12-18 NOTE — Progress Notes (Signed)
Wants to discuss new dermatology referral. Checks sugar before going to sleep it 120 when he wakes it over 300

## 2020-12-18 NOTE — Progress Notes (Addendum)
Subjective:  Patient ID: Jared Murray, male    DOB: July 12, 1976  Age: 45 y.o. MRN: [id number]  CC: Diabetes   HPI Shyam ZAFAR DEBROSSE is a 45 year old male with a history of type 1 diabetes mellitus (A1c 10.6), avascular necrosis of both hips, GERD, and Vitiligo, psoriasis who presents today for a follow up visit.   He would like a Dermatology second opinion as the alopecia on his scalp is worsening. His last Dermatologist was with [facility]  He still has to wear depends at night due to diarrhea and this gets worse with anxiety or when he is stressed.  Pancreatic elastase was greater than 500 in 08/2017. His diabetes is followed by endocrine at [facility] and he currently has a CGM which he likes.  He continues to have fluctuations in his sugars which range from low 100s to low 300s. Unable to follow-up with orthopedics for his avascular necrosis due to uncontrolled diabetes mellitus.  Past Medical History:  Diagnosis Date  . Acute pericarditis 03/31/2016  . AKI (acute kidney injury) ([place]) 01/22/2015  . Allergy   . Anxiety    per pt  . Arthritis   . Avascular necrosis ([place]) 02/12/2015  . Avascular necrosis of bone of hip ([place])   . Chest wall abscess 04/03/2015  . Chronic diarrhea of unknown origin 09/10/2017  . Cutaneous abscess of chest wall   . Depression    per pt  . DKA (diabetic ketoacidoses)   . DKA, type 1, not at goal [facility]) 04/03/2015  . Eczema   . Elevated liver enzymes 11/27/2018  . GERD (gastroesophageal reflux disease)   . HLD (hyperlipidemia) 11/25/2018   no meds  . Hyperbilirubinemia   . Hyperglycemia due to type 1 diabetes mellitus ([place])   . Hyperkalemia   . Hypokalemia 03/17/2013  . Left hip pain 04/18/2014  . Loss of weight 03/17/2013  . Marijuana use 11/25/2018  . Osteoarthritis 12/24/2016  . Prolonged QT interval 11/27/2018   in setting of RBBB and hypokalemia  . Protein-calorie malnutrition, severe ([place]) 03/17/2013  . Psoriasis   . RBBB 12/23/2018    Chronic since at least 2014  . Sepsis ([place]) 01/22/2015  . Substance abuse ([place])   . Tobacco use disorder 09/13/2015  . Type 1 diabetes mellitus ([place])     Past Surgical History:  Procedure Laterality Date  . COLONOSCOPY  2020  . COLONOSCOPY WITH PROPOFOL N/A 05/07/2020   Procedure: COLONOSCOPY WITH PROPOFOL;  Surgeon: Rush Landmark Telford Nab., MD;  Location: [place];  Service: Gastroenterology;  Laterality: N/A;  . ENDOSCOPIC MUCOSAL RESECTION N/A 05/07/2020   Procedure: ENDOSCOPIC MUCOSAL RESECTION;  Surgeon: Rush Landmark Telford Nab., MD;  Location: [place];  Service: Gastroenterology;  Laterality: N/A;  . FINGER SURGERY Right    middle finger  . SUBMUCOSAL LIFTING INJECTION  05/07/2020   Procedure: SUBMUCOSAL LIFTING INJECTION;  Surgeon: Rush Landmark Telford Nab., MD;  Location: [facility] ENDOSCOPY;  Service: Gastroenterology;;    Family History  Problem Relation Age of Onset  . Hypertension Father   . Diabetes Other        multiple  . Lupus Cousin   . Stroke Maternal Grandmother   . Stroke Paternal Grandmother   . Colon cancer Neg Hx   . Stomach cancer Neg Hx   . Rectal cancer Neg Hx   . Esophageal cancer Neg Hx   . Colon polyps Neg Hx     Allergies  Allergen Reactions  . Bee Venom Swelling  . Latex  Itching and Rash  . Penicillins Other (See Comments)    Childhood allergy Has patient had a PCN reaction causing immediate rash, facial/tongue/throat swelling, SOB or lightheadedness with hypotension: NO Has patient had a PCN reaction causing severe rash involving mucus membranes or skin necrosis:NO Has patient had a PCN reaction that required hospitalization NO Has patient had a PCN reaction occurring within the last 10 years: NO If all of the above answers are "NO", then may proceed with Cephalosporin use.    Outpatient Medications Prior to Visit  Medication Sig Dispense Refill  . Accu-Chek Softclix Lancets lancets Use as instructed 3 times daily. Dx: Type 1 DM 120 each 5   . aspirin-sod bicarb-citric acid (ALKA-SELTZER) 325 MG TBEF tablet Take 325 mg by mouth every 6 (six) hours as needed (heartburn).    Marland Kitchen atorvastatin (LIPITOR) 20 MG tablet TAKE 1 TABLET (20 MG TOTAL) BY MOUTH DAILY. 30 tablet 6  . Blood Glucose Monitoring Suppl (ACCU-CHEK AVIVA PLUS) w/Device KIT TEST 3 TIMES DAILY 1 kit 0  . clobetasol cream (TEMOVATE) 0.76 % APPLY 1 APPLICATION TOPICALLY 2 (TWO) TIMES DAILY AS NEEDED (RASH). 30 g 0  . fluocinonide (LIDEX) 0.05 % external solution Apply 1 application topically daily as needed (psoriasis).     . Glucagon 1 MG/0.2ML SOAJ USE AS DIRECTED FOR SEVERE HYPOGLYCEMIA .2 mL 3  . glucose blood (ACCU-CHEK AVIVA PLUS) test strip USE TO CHECK BLOOD SUGAR FOUR TIMES DAILY BEFORE MEALS AND AT BEDTIME 120 each 11  . hydrocortisone (ANUSOL-HC) 25 MG suppository Place 1 suppository (25 mg total) rectally at bedtime. Nightly for 1 week and then every other night until suppositories are completed (12 total). 12 suppository 0  . insulin aspart (NOVOLOG) 100 UNIT/ML injection INJECT 0-12 UNITS INTO THE SKIN THREE TIMES DAILY WITH MEALS AS PER SLIDING SCALE 10 mL 2  . Insulin Glargine (BASAGLAR KWIKPEN) 100 UNIT/ML INJECT 12 UNITS INTO THE SKIN DAILY. 15 mL 1  . Insulin Syringe-Needle U-100 (B-D INS SYRINGE 2CC/29GX1/2") 29G X 1/2" 2 ML MISC Check Blood sugar TID and QHS 100 each 12  . pantoprazole (PROTONIX) 40 MG tablet TAKE 1 TABLET (40 MG TOTAL) BY MOUTH DAILY. (Patient taking differently: Take 40 mg by mouth daily as needed (acid reflux).) 30 tablet 3  . sildenafil (VIAGRA) 50 MG tablet Take 1 tablet (50 mg total) by mouth daily as needed for erectile dysfunction (At least 24 hours between doses). 10 tablet 1  . Tetrahydrozoline HCl (VISINE OP) Place 1 drop into both eyes daily as needed (dryness / allergies).     No facility-administered medications prior to visit.     ROS Review of Systems  Constitutional: Negative for activity change and appetite change.   HENT: Negative for sinus pressure and sore throat.   Eyes: Negative for visual disturbance.  Respiratory: Negative for cough, chest tightness and shortness of breath.   Cardiovascular: Negative for chest pain and leg swelling.  Gastrointestinal: Positive for diarrhea. Negative for abdominal distention, abdominal pain and constipation.  Endocrine: Negative.   Genitourinary: Negative for dysuria.  Musculoskeletal: Positive for arthralgias. Negative for joint swelling and myalgias.  Skin: Negative for rash.  Allergic/Immunologic: Negative.   Neurological: Negative for weakness, light-headedness and numbness.  Psychiatric/Behavioral: Negative for dysphoric mood and suicidal ideas.    Objective:  BP 104/67   Pulse 87   Ht $R'5\' 10"'sj$  (1.778 m)   Wt 128 lb (58.1 kg)   SpO2 100%   BMI 18.37 kg/m   BP/Weight  12/18/2020 05/16/2020 0/26/3785  Systolic BP 885 027 741  Diastolic BP 67 66 91  Wt. (Lbs) 128 133.2 -  BMI 18.37 19.11 18.65      Physical Exam Constitutional:      Appearance: He is well-developed.     Comments: Underweight  Neck:     Vascular: No JVD.  Cardiovascular:     Rate and Rhythm: Normal rate.     Heart sounds: Normal heart sounds. No murmur heard.   Pulmonary:     Effort: Pulmonary effort is normal.     Breath sounds: Normal breath sounds. No wheezing or rales.  Chest:     Chest wall: No tenderness.  Abdominal:     General: Bowel sounds are normal. There is no distension.     Palpations: Abdomen is soft. There is no mass.     Tenderness: There is no abdominal tenderness.  Musculoskeletal:     Right lower leg: No edema.     Left lower leg: No edema.     Comments: Pain in hip on range of motion  Skin:    Comments: Patches of alopecia on central aspect of scalp with underlying erythema of scalp. Scattered areas of vitiligo on extremities.  Neurological:     Mental Status: He is alert and oriented to person, place, and time.  Psychiatric:        Mood and  Affect: Mood normal.    Diabetic Foot Exam - Simple   Simple Foot Form Diabetic Foot exam was performed with the following findings: Yes 12/18/2020 10:45 AM  Visual Inspection See comments: Yes Sensation Testing See comments: Yes Pulse Check Posterior Tibialis and Dorsalis pulse intact bilaterally: Yes Comments Vitiligo on the skin of both feet bilaterally.  No deformities, ulcerations or skin breakdown. Sensation in right foot felt in 9 out of 10 areas tested.  Sensation in left foot felt an 8 out of 10 areas tested      CMP Latest Ref Rng & Units 02/14/2020 12/30/2019 08/02/2019  Glucose 65 - 99 mg/dL 62(L) - 141(H)  BUN 6 - 24 mg/dL 7 - 4(L)  Creatinine 0.76 - 1.27 mg/dL 0.90 - 0.68(L)  Sodium 134 - 144 mmol/L 140 - 139  Potassium 3.5 - 5.2 mmol/L 3.6 - 3.8  Chloride 96 - 106 mmol/L 100 - 101  CO2 20 - 29 mmol/L 26 - 26  Calcium 8.7 - 10.2 mg/dL 9.4 - 9.3  Total Protein 6.0 - 8.3 g/dL - 6.8 -  Total Bilirubin 0.2 - 1.2 mg/dL - 0.8 -  Alkaline Phos 39 - 117 U/L - 119(H) -  AST 0 - 37 U/L - 28 -  ALT 0 - 53 U/L - 28 -    Lipid Panel     Component Value Date/Time   CHOL 123 02/14/2020 1052   TRIG 39 02/14/2020 1052   HDL 45 02/14/2020 1052   CHOLHDL 1.6 10/19/2018 1045   CHOLHDL 2.3 12/19/2015 0947   VLDL 12 12/19/2015 0947   LDLCALC 68 02/14/2020 1052    CBC    Component Value Date/Time   WBC 5.0 12/30/2019 1651   RBC 3.99 (L) 12/30/2019 1651   HGB 11.6 (L) 12/30/2019 1651   HGB 12.4 (L) 12/16/2018 1444   HCT 34.6 (L) 12/30/2019 1651   HCT 36.8 (L) 12/16/2018 1444   PLT 376 12/30/2019 1651   PLT 299 12/16/2018 1444   MCV 86.7 12/30/2019 1651   MCV 89 12/16/2018 1444   MCH 29.1 12/30/2019 1651  MCHC 33.5 12/30/2019 1651   RDW 13.1 12/30/2019 1651   RDW 13.0 12/16/2018 1444   LYMPHSABS 2,095 12/30/2019 1651   LYMPHSABS 2.1 12/16/2018 1444   MONOABS 0.4 11/25/2018 0352   EOSABS 170 12/30/2019 1651   EOSABS 0.2 12/16/2018 1444   BASOSABS 50 12/30/2019  1651   BASOSABS 0.0 12/16/2018 1444    Lab Results  Component Value Date   HGBA1C 10.6 (A) 12/18/2020    Assessment & Plan:  1. Type 1 diabetes mellitus with hypoglycemia and without coma (HCC) Uncontrolled with A1c of 10.6; goal is less than 7.0 Continue current regimen as per endocrine Counseled on Diabetic diet, my plate method, 349 minutes of moderate intensity exercise/week Blood sugar logs with fasting goals of 80-120 mg/dl, random of less than 180 and in the event of sugars less than 60 mg/dl or greater than 400 mg/dl encouraged to notify the clinic. Advised on the need for annual eye exams, annual foot exams, Pneumonia vaccine. - POCT glucose (manual entry) - POCT glycosylated hemoglobin (Hb A1C) - CMP14+EGFR  - Lipid panel - Microalbumin / creatinine urine ratio  2. Psoriasis Uncontrolled on current topical steroid regimen Will refer to dermatology - Ambulatory referral to Dermatology  3. Chronic diarrhea Uncontrolled He might have a component of IBS Placed on Bentyl - dicyclomine (BENTYL) 10 MG capsule; Take 1 capsule (10 mg total) by mouth 2 (two) times daily as needed for spasms.  Dispense: 60 capsule; Refill: 2  4. Avascular necrosis ([place]) Uncontrolled Surgery pending optimization of glycemic control   Meds ordered this encounter  Medications  . dicyclomine (BENTYL) 10 MG capsule    Sig: Take 1 capsule (10 mg total) by mouth 2 (two) times daily as needed for spasms.    Dispense:  60 capsule    Refill:  2    Follow-up: Return in about 6 months (around 06/20/2021) for Chronic disease management.       Charlott Rakes, MD, FAAFP. [facility] [place], [place]   12/18/2020, 12:35 PM

## 2020-12-19 LAB — CMP14+EGFR
ALT: 36 IU/L (ref 0–44)
AST: 36 IU/L (ref 0–40)
Albumin/Globulin Ratio: 1.9 (ref 1.2–2.2)
Albumin: 3.9 g/dL — ABNORMAL LOW (ref 4.0–5.0)
Alkaline Phosphatase: 130 IU/L — ABNORMAL HIGH (ref 44–121)
BUN/Creatinine Ratio: 10 (ref 9–20)
BUN: 10 mg/dL (ref 6–24)
Bilirubin Total: 0.3 mg/dL (ref 0.0–1.2)
CO2: 19 mmol/L — ABNORMAL LOW (ref 20–29)
Calcium: 8.7 mg/dL (ref 8.7–10.2)
Chloride: 103 mmol/L (ref 96–106)
Creatinine, Ser: 1.03 mg/dL (ref 0.76–1.27)
Globulin, Total: 2.1 g/dL (ref 1.5–4.5)
Glucose: 249 mg/dL — ABNORMAL HIGH (ref 65–99)
Potassium: 4.5 mmol/L (ref 3.5–5.2)
Sodium: 136 mmol/L (ref 134–144)
Total Protein: 6 g/dL (ref 6.0–8.5)
eGFR: 92 mL/min/{1.73_m2} (ref >59)

## 2020-12-19 LAB — LIPID PANEL
Chol/HDL Ratio: 2.9 ratio (ref 0.0–5.0)
Cholesterol, Total: 115 mg/dL (ref 100–199)
HDL: 39 mg/dL — ABNORMAL LOW (ref >39)
LDL Chol Calc (NIH): 64 mg/dL (ref 0–99)
Triglycerides: 52 mg/dL (ref 0–149)
VLDL Cholesterol Cal: 12 mg/dL (ref 5–40)

## 2020-12-19 LAB — MICROALBUMIN / CREATININE URINE RATIO
Creatinine, Urine: 303.6 mg/dL
Microalb/Creat Ratio: 8 mg/g creat (ref 0–29)
Microalbumin, Urine: 25.7 ug/mL

## 2020-12-20 ENCOUNTER — Other Ambulatory Visit: Payer: Self-pay

## 2020-12-20 ENCOUNTER — Other Ambulatory Visit: Payer: Self-pay | Admitting: Family Medicine

## 2020-12-20 MED FILL — Pantoprazole Sodium EC Tab 40 MG (Base Equiv): ORAL | 30 days supply | Qty: 30 | Fill #0 | Status: AC

## 2020-12-20 MED FILL — Insulin Aspart Inj Soln 100 Unit/ML: INTRAMUSCULAR | 27 days supply | Qty: 10 | Fill #0 | Status: AC

## 2020-12-20 MED FILL — Atorvastatin Calcium Tab 20 MG (Base Equivalent): ORAL | 30 days supply | Qty: 30 | Fill #0 | Status: AC

## 2020-12-20 MED FILL — Clobetasol Propionate Cream 0.05%: CUTANEOUS | 30 days supply | Qty: 30 | Fill #0 | Status: CN

## 2020-12-20 NOTE — Telephone Encounter (Signed)
   Notes to clinic:  CAN WE SWITCH TO BETAMETHASONE OR SOMETHING ELSE THAT HIS INSURANCE WILL COVER   Requested Prescriptions  Pending Prescriptions Disp Refills   clobetasol cream (TEMOVATE) 0.05 % 30 g 0    Sig: APPLY 1 APPLICATION TOPICALLY 2 (TWO) TIMES DAILY AS NEEDED (RASH).      Dermatology:  Corticosteroids Passed - 12/20/2020 10:13 AM      Passed - Valid encounter within last 12 months    Recent Outpatient Visits           2 days ago Type 1 diabetes mellitus with hypoglycemia and without coma ([place])   [place], [place], MD   7 months ago Type 1 diabetes mellitus with hypoglycemia and without coma ([place])   [place], Charlane Ferretti, MD   10 months ago Type 1 diabetes mellitus with hypoglycemia and without coma [facility])   [place], Charlane Ferretti, MD   1 year ago Type 1 diabetes mellitus with hyperglycemia [facility])   [place], Charlane Ferretti, MD   1 year ago Type 1 diabetes mellitus with other neurologic complication [facility])   [place], MD       Future Appointments             In 6 months Charlott Rakes, MD [place]

## 2020-12-21 ENCOUNTER — Other Ambulatory Visit: Payer: Self-pay

## 2020-12-21 MED ORDER — CLOBETASOL PROPIONATE 0.05 % EX CREA
TOPICAL_CREAM | CUTANEOUS | 0 refills | Status: AC
  Filled 2020-12-21: qty 30, 15d supply, fill #0

## 2021-02-05 DIAGNOSIS — L2089 Other atopic dermatitis: Secondary | ICD-10-CM | POA: Diagnosis not present

## 2021-02-05 DIAGNOSIS — L409 Psoriasis, unspecified: Secondary | ICD-10-CM | POA: Diagnosis not present

## 2021-02-05 DIAGNOSIS — E108 Type 1 diabetes mellitus with unspecified complications: Secondary | ICD-10-CM | POA: Diagnosis not present

## 2021-02-05 DIAGNOSIS — Z794 Long term (current) use of insulin: Secondary | ICD-10-CM | POA: Diagnosis not present

## 2021-02-05 DIAGNOSIS — L659 Nonscarring hair loss, unspecified: Secondary | ICD-10-CM | POA: Diagnosis not present

## 2021-02-15 ENCOUNTER — Other Ambulatory Visit: Payer: Self-pay | Admitting: Family Medicine

## 2021-02-15 ENCOUNTER — Other Ambulatory Visit: Payer: Self-pay

## 2021-02-15 DIAGNOSIS — K219 Gastro-esophageal reflux disease without esophagitis: Secondary | ICD-10-CM

## 2021-02-15 DIAGNOSIS — IMO0001 Reserved for inherently not codable concepts without codable children: Secondary | ICD-10-CM

## 2021-02-15 DIAGNOSIS — E1065 Type 1 diabetes mellitus with hyperglycemia: Secondary | ICD-10-CM

## 2021-02-15 MED ORDER — PANTOPRAZOLE SODIUM 40 MG PO TBEC
DELAYED_RELEASE_TABLET | Freq: Every day | ORAL | 3 refills | Status: DC
  Filled 2021-02-15: qty 30, 30d supply, fill #0
  Filled 2021-03-20: qty 30, 30d supply, fill #1
  Filled 2021-04-23: qty 30, 30d supply, fill #2
  Filled 2021-06-25: qty 30, 30d supply, fill #3

## 2021-02-15 MED ORDER — INSULIN ASPART 100 UNIT/ML IJ SOLN
INTRAMUSCULAR | 2 refills | Status: DC
  Filled 2021-02-15: qty 10, 30d supply, fill #0
  Filled 2021-05-10: qty 10, 28d supply, fill #1
  Filled 2021-07-25: qty 10, 28d supply, fill #2

## 2021-02-15 MED ORDER — INSULIN SYRINGE 29G X 1/2" 1 ML MISC
2 refills | Status: DC
  Filled 2021-02-15: qty 100, 25d supply, fill #0

## 2021-02-15 MED FILL — Atorvastatin Calcium Tab 20 MG (Base Equivalent): ORAL | 30 days supply | Qty: 30 | Fill #1 | Status: AC

## 2021-02-15 MED FILL — Insulin Glargine Soln Pen-Injector 100 Unit/ML: SUBCUTANEOUS | 100 days supply | Qty: 12 | Fill #0 | Status: AC

## 2021-02-15 NOTE — Telephone Encounter (Signed)
  Notes to clinic:  Verify direction for refill    Requested Prescriptions  Pending Prescriptions Disp Refills   INSULIN SYRINGE 1CC/29G 29G X 1/2" 1 ML MISC 100 each 0    Sig: use as directed      There is no refill protocol information for this order

## 2021-02-21 DIAGNOSIS — Z20822 Contact with and (suspected) exposure to covid-19: Secondary | ICD-10-CM | POA: Diagnosis not present

## 2021-02-22 ENCOUNTER — Other Ambulatory Visit: Payer: Self-pay

## 2021-03-20 ENCOUNTER — Other Ambulatory Visit: Payer: Self-pay

## 2021-03-20 ENCOUNTER — Other Ambulatory Visit (HOSPITAL_COMMUNITY): Payer: Self-pay

## 2021-03-20 DIAGNOSIS — R21 Rash and other nonspecific skin eruption: Secondary | ICD-10-CM | POA: Diagnosis not present

## 2021-03-20 DIAGNOSIS — L659 Nonscarring hair loss, unspecified: Secondary | ICD-10-CM | POA: Diagnosis not present

## 2021-03-20 DIAGNOSIS — L280 Lichen simplex chronicus: Secondary | ICD-10-CM | POA: Diagnosis not present

## 2021-03-20 MED ORDER — MUPIROCIN 2 % EX OINT
1.0000 "application " | TOPICAL_OINTMENT | Freq: Two times a day (BID) | CUTANEOUS | 0 refills | Status: DC
  Filled 2021-03-20: qty 22, 11d supply, fill #0

## 2021-03-20 MED FILL — Atorvastatin Calcium Tab 20 MG (Base Equivalent): ORAL | 30 days supply | Qty: 30 | Fill #2 | Status: AC

## 2021-03-21 ENCOUNTER — Other Ambulatory Visit: Payer: Self-pay

## 2021-04-23 ENCOUNTER — Other Ambulatory Visit: Payer: Self-pay | Admitting: Family Medicine

## 2021-04-23 ENCOUNTER — Other Ambulatory Visit: Payer: Self-pay

## 2021-04-23 DIAGNOSIS — E10649 Type 1 diabetes mellitus with hypoglycemia without coma: Secondary | ICD-10-CM

## 2021-04-24 ENCOUNTER — Other Ambulatory Visit: Payer: Self-pay

## 2021-04-24 MED ORDER — ATORVASTATIN CALCIUM 20 MG PO TABS
ORAL_TABLET | Freq: Every day | ORAL | 5 refills | Status: DC
  Filled 2021-04-24: qty 30, 30d supply, fill #0
  Filled 2021-06-25: qty 30, 30d supply, fill #1
  Filled 2021-07-25: qty 30, 30d supply, fill #2

## 2021-04-24 NOTE — Telephone Encounter (Signed)
Requested Prescriptions  Pending Prescriptions Disp Refills  . atorvastatin (LIPITOR) 20 MG tablet 30 tablet 5    Sig: TAKE 1 TABLET (20 MG TOTAL) BY MOUTH DAILY.     Cardiovascular:  Antilipid - Statins Failed - 04/23/2021  4:08 PM      Failed - HDL in normal range and within 360 days    HDL  Date Value Ref Range Status  12/18/2020 39 (L) >39 mg/dL Final         Passed - Total Cholesterol in normal range and within 360 days    Cholesterol, Total  Date Value Ref Range Status  12/18/2020 115 100 - 199 mg/dL Final         Passed - LDL in normal range and within 360 days    LDL Chol Calc (NIH)  Date Value Ref Range Status  12/18/2020 64 0 - 99 mg/dL Final         Passed - Triglycerides in normal range and within 360 days    Triglycerides  Date Value Ref Range Status  12/18/2020 52 0 - 149 mg/dL Final         Passed - Patient is not pregnant      Passed - Valid encounter within last 12 months    Recent Outpatient Visits          4 months ago Type 1 diabetes mellitus with hypoglycemia and without coma ([place])   [place], [place], MD   11 months ago Type 1 diabetes mellitus with hypoglycemia and without coma ([place])   [place], [place], MD   1 year ago Type 1 diabetes mellitus with hypoglycemia and without coma ([place])   [place], Charlane Ferretti, MD   1 year ago Type 1 diabetes mellitus with hyperglycemia ([place])   [place], Charlane Ferretti, MD   1 year ago Type 1 diabetes mellitus with other neurologic complication [facility])   [place], [place], MD      Future Appointments            In 1 month Charlott Rakes, MD [place]

## 2021-05-10 ENCOUNTER — Other Ambulatory Visit: Payer: Self-pay

## 2021-05-10 MED FILL — Insulin Glargine Soln Pen-Injector 100 Unit/ML: SUBCUTANEOUS | 25 days supply | Qty: 3 | Fill #1 | Status: AC

## 2021-05-15 DIAGNOSIS — L280 Lichen simplex chronicus: Secondary | ICD-10-CM | POA: Diagnosis not present

## 2021-05-15 DIAGNOSIS — L659 Nonscarring hair loss, unspecified: Secondary | ICD-10-CM | POA: Diagnosis not present

## 2021-05-17 ENCOUNTER — Other Ambulatory Visit: Payer: Self-pay

## 2021-06-14 ENCOUNTER — Other Ambulatory Visit: Payer: Self-pay | Admitting: Family Medicine

## 2021-06-14 ENCOUNTER — Other Ambulatory Visit: Payer: Self-pay | Admitting: Pharmacist

## 2021-06-14 ENCOUNTER — Other Ambulatory Visit: Payer: Self-pay

## 2021-06-14 DIAGNOSIS — E10649 Type 1 diabetes mellitus with hypoglycemia without coma: Secondary | ICD-10-CM

## 2021-06-14 MED ORDER — BASAGLAR KWIKPEN 100 UNIT/ML ~~LOC~~ SOPN
PEN_INJECTOR | SUBCUTANEOUS | 1 refills | Status: DC
  Filled 2021-06-14 – 2021-08-23 (×2): qty 9, 75d supply, fill #0

## 2021-06-20 ENCOUNTER — Ambulatory Visit: Payer: Medicare Other | Attending: Family Medicine | Admitting: Family Medicine

## 2021-06-20 ENCOUNTER — Other Ambulatory Visit: Payer: Self-pay

## 2021-06-20 ENCOUNTER — Encounter: Payer: Self-pay | Admitting: Family Medicine

## 2021-06-20 VITALS — BP 104/68 | HR 87 | Ht 70.0 in | Wt 133.0 lb

## 2021-06-20 DIAGNOSIS — L84 Corns and callosities: Secondary | ICD-10-CM | POA: Diagnosis not present

## 2021-06-20 DIAGNOSIS — N528 Other male erectile dysfunction: Secondary | ICD-10-CM

## 2021-06-20 DIAGNOSIS — K529 Noninfective gastroenteritis and colitis, unspecified: Secondary | ICD-10-CM

## 2021-06-20 DIAGNOSIS — E10649 Type 1 diabetes mellitus with hypoglycemia without coma: Secondary | ICD-10-CM | POA: Diagnosis not present

## 2021-06-20 DIAGNOSIS — Z20828 Contact with and (suspected) exposure to other viral communicable diseases: Secondary | ICD-10-CM | POA: Diagnosis not present

## 2021-06-20 MED ORDER — SILDENAFIL CITRATE 50 MG PO TABS
50.0000 mg | ORAL_TABLET | Freq: Every day | ORAL | 1 refills | Status: DC | PRN
  Filled 2021-06-20: qty 10, 30d supply, fill #0
  Filled 2021-06-25: qty 10, 10d supply, fill #0

## 2021-06-20 MED ORDER — DICYCLOMINE HCL 10 MG PO CAPS
10.0000 mg | ORAL_CAPSULE | Freq: Two times a day (BID) | ORAL | 6 refills | Status: DC | PRN
  Filled 2021-06-20: qty 60, 30d supply, fill #0
  Filled 2021-07-25: qty 60, 30d supply, fill #1

## 2021-06-20 NOTE — Progress Notes (Signed)
Subjective:  Patient ID: Jared Murray, male    DOB: 1975-09-08  Age: 45 y.o. MRN: [id number]  CC: Blister   HPI Jared Murray is a 45 y.o. year old male with a history of type 1 diabetes mellitus (A1c 9.7 from [facility]), avascular necrosis of both hips, GERD, and Vitiligo, psoriasis who presents today for a follow up visit.     Interval History: He noticed a lesion on the base of his L big toe which he initially thought was dry skin but spouse said it was a Callus.  Lesion is not painful or itchy.  Last A1c was 9.7 in 01/2021 at [facility]; he has an upcoming appointment there with his endocrinologist later this month.  He is yet to have an annual eye exam. Continuing to work on a diabetic diet and he has quit drinking and quit smoking. He still has Diarrhea which he states he deals with better and Bentyl helps some. Past Medical History:  Diagnosis Date   Acute pericarditis 03/31/2016   AKI (acute kidney injury) ([place]) 01/22/2015   Allergy    Anxiety    per pt   Arthritis    Avascular necrosis ([place]) 02/12/2015   Avascular necrosis of bone of hip ([place])    Chest wall abscess 04/03/2015   Chronic diarrhea of unknown origin 09/10/2017   Cutaneous abscess of chest wall    Depression    per pt   DKA (diabetic ketoacidoses)    DKA, type 1, not at goal [facility]) 04/03/2015   Eczema    Elevated liver enzymes 11/27/2018   GERD (gastroesophageal reflux disease)    HLD (hyperlipidemia) 11/25/2018   no meds   Hyperbilirubinemia    Hyperglycemia due to type 1 diabetes mellitus (HCC)    Hyperkalemia    Hypokalemia 03/17/2013   Left hip pain 04/18/2014   Loss of weight 03/17/2013   Marijuana use 11/25/2018   Osteoarthritis 12/24/2016   Prolonged QT interval 11/27/2018   in setting of RBBB and hypokalemia   Protein-calorie malnutrition, severe ([place]) 03/17/2013   Psoriasis    RBBB 12/23/2018   Chronic since at least 2014   Sepsis ([place]) 01/22/2015   Substance abuse ([place])    Tobacco use disorder  09/13/2015   Type 1 diabetes mellitus ([place])     Past Surgical History:  Procedure Laterality Date   COLONOSCOPY  2020   COLONOSCOPY WITH PROPOFOL N/A 05/07/2020   Procedure: COLONOSCOPY WITH PROPOFOL;  Surgeon: Irving Copas., MD;  Location: [place];  Service: Gastroenterology;  Laterality: N/A;   ENDOSCOPIC MUCOSAL RESECTION N/A 05/07/2020   Procedure: ENDOSCOPIC MUCOSAL RESECTION;  Surgeon: Rush Landmark Telford Nab., MD;  Location: [place];  Service: Gastroenterology;  Laterality: N/A;   FINGER SURGERY Right    middle finger   SUBMUCOSAL LIFTING INJECTION  05/07/2020   Procedure: SUBMUCOSAL LIFTING INJECTION;  Surgeon: Rush Landmark Telford Nab., MD;  Location: [facility] ENDOSCOPY;  Service: Gastroenterology;;    Family History  Problem Relation Age of Onset   Hypertension Father    Diabetes Other        multiple   Lupus Cousin    Stroke Maternal Grandmother    Stroke Paternal Grandmother    Colon cancer Neg Hx    Stomach cancer Neg Hx    Rectal cancer Neg Hx    Esophageal cancer Neg Hx    Colon polyps Neg Hx     Allergies  Allergen Reactions   Bee Venom Swelling   Latex  Itching and Rash   Penicillins Other (See Comments)    Childhood allergy Has patient had a PCN reaction causing immediate rash, facial/tongue/throat swelling, SOB or lightheadedness with hypotension: NO Has patient had a PCN reaction causing severe rash involving mucus membranes or skin necrosis:NO Has patient had a PCN reaction that required hospitalization NO Has patient had a PCN reaction occurring within the last 10 years: NO If all of the above answers are "NO", then may proceed with Cephalosporin use.    Outpatient Medications Prior to Visit  Medication Sig Dispense Refill   Accu-Chek Softclix Lancets lancets Use as instructed 3 times daily. Dx: Type 1 DM 120 each 5   aspirin-sod bicarb-citric acid (ALKA-SELTZER) 325 MG TBEF tablet Take 325 mg by mouth every 6 (six) hours as needed  (heartburn).     atorvastatin (LIPITOR) 20 MG tablet TAKE 1 TABLET (20 MG TOTAL) BY MOUTH DAILY. 30 tablet 5   Blood Glucose Monitoring Suppl (ACCU-CHEK AVIVA PLUS) w/Device KIT TEST 3 TIMES DAILY 1 kit 0   clobetasol cream (TEMOVATE) 8.11 % APPLY 1 APPLICATION TOPICALLY 2 (TWO) TIMES DAILY AS NEEDED (RASH). 30 g 0   glucose blood (ACCU-CHEK AVIVA PLUS) test strip USE TO CHECK BLOOD SUGAR FOUR TIMES DAILY BEFORE MEALS AND AT BEDTIME 120 each 11   insulin aspart (NOVOLOG) 100 UNIT/ML injection Inject 0-12 units into the skin three times daily with meals as per sliding scale 10 mL 2   Insulin Glargine (BASAGLAR KWIKPEN) 100 UNIT/ML INJECT 12 UNITS INTO THE SKIN DAILY. 15 mL 1   INSULIN SYRINGE 1CC/29G 29G X 1/2" 1 ML MISC use as directed 100 each 2   mupirocin ointment (BACTROBAN) 2 % Apply 1 application topically 2 (two) times daily to open area on the scalp. 22 g 0   pantoprazole (PROTONIX) 40 MG tablet TAKE 1 TABLET (40 MG TOTAL) BY MOUTH DAILY. 30 tablet 3   Tetrahydrozoline HCl (VISINE OP) Place 1 drop into both eyes daily as needed (dryness / allergies).     dicyclomine (BENTYL) 10 MG capsule Take 1 capsule (10 mg total) by mouth 2 (two) times daily as needed for spasms. 60 capsule 2   sildenafil (VIAGRA) 50 MG tablet Take 1 tablet (50 mg total) by mouth daily as needed for erectile dysfunction (At least 24 hours between doses). 10 tablet 1   No facility-administered medications prior to visit.     ROS Review of Systems  Constitutional:  Negative for activity change and appetite change.  HENT:  Negative for sinus pressure and sore throat.   Eyes:  Negative for visual disturbance.  Respiratory:  Negative for cough, chest tightness and shortness of breath.   Cardiovascular:  Negative for chest pain and leg swelling.  Gastrointestinal:  Positive for diarrhea. Negative for abdominal distention, abdominal pain and constipation.  Endocrine: Negative.   Genitourinary:  Negative for dysuria.   Musculoskeletal:  Negative for joint swelling and myalgias.  Skin:  Positive for rash.  Allergic/Immunologic: Negative.   Neurological:  Negative for weakness, light-headedness and numbness.  Psychiatric/Behavioral:  Negative for dysphoric mood and suicidal ideas.   Objective:  BP 104/68   Pulse 87   Ht _0  (1.778 m)   Wt 133 lb (60.3 kg)   SpO2 100%   BMI 19.08 kg/m   BP/Weight 06/20/2021 12/18/2020 0/31/5945  Systolic BP 859 292 446  Diastolic BP 68 67 66  Wt. (Lbs) 133 128 133.2  BMI 19.08 18.37 19.11  Physical Exam Constitutional:      Appearance: He is well-developed.  Cardiovascular:     Rate and Rhythm: Normal rate.     Heart sounds: Normal heart sounds. No murmur heard. Pulmonary:     Effort: Pulmonary effort is normal.     Breath sounds: Normal breath sounds. No wheezing or rales.  Chest:     Chest wall: No tenderness.  Abdominal:     General: Bowel sounds are normal. There is no distension.     Palpations: Abdomen is soft. There is no mass.     Tenderness: There is no abdominal tenderness.  Musculoskeletal:        General: Normal range of motion.     Right lower leg: No edema.     Left lower leg: No edema.  Skin:    Comments: Tiny callus on the base of left big toe.  No ulceration Hypopigmented lesions on skin  Neurological:     Mental Status: He is alert and oriented to person, place, and time.  Psychiatric:        Mood and Affect: Mood normal.    CMP Latest Ref Rng & Units 12/18/2020 02/14/2020 12/30/2019  Glucose 65 - 99 mg/dL 249(H) 62(L) -  BUN 6 - 24 mg/dL 10 7 -  Creatinine 0.76 - 1.27 mg/dL 1.03 0.90 -  Sodium 134 - 144 mmol/L 136 140 -  Potassium 3.5 - 5.2 mmol/L 4.5 3.6 -  Chloride 96 - 106 mmol/L 103 100 -  CO2 20 - 29 mmol/L 19(L) 26 -  Calcium 8.7 - 10.2 mg/dL 8.7 9.4 -  Total Protein 6.0 - 8.5 g/dL 6.0 - 6.8  Total Bilirubin 0.0 - 1.2 mg/dL 0.3 - 0.8  Alkaline Phos 44 - 121 IU/L 130(H) - 119(H)  AST 0 - 40 IU/L 36 - 28  ALT 0 -  44 IU/L 36 - 28    Lipid Panel     Component Value Date/Time   CHOL 115 12/18/2020 1104   TRIG 52 12/18/2020 1104   HDL 39 (L) 12/18/2020 1104   CHOLHDL 2.9 12/18/2020 1104   CHOLHDL 2.3 12/19/2015 0947   VLDL 12 12/19/2015 0947   LDLCALC 64 12/18/2020 1104    CBC    Component Value Date/Time   WBC 5.0 12/30/2019 1651   RBC 3.99 (L) 12/30/2019 1651   HGB 11.6 (L) 12/30/2019 1651   HGB 12.4 (L) 12/16/2018 1444   HCT 34.6 (L) 12/30/2019 1651   HCT 36.8 (L) 12/16/2018 1444   PLT 376 12/30/2019 1651   PLT 299 12/16/2018 1444   MCV 86.7 12/30/2019 1651   MCV 89 12/16/2018 1444   MCH 29.1 12/30/2019 1651   MCHC 33.5 12/30/2019 1651   RDW 13.1 12/30/2019 1651   RDW 13.0 12/16/2018 1444   LYMPHSABS 2,095 12/30/2019 1651   LYMPHSABS 2.1 12/16/2018 1444   MONOABS 0.4 11/25/2018 0352   EOSABS 170 12/30/2019 1651   EOSABS 0.2 12/16/2018 1444   BASOSABS 50 12/30/2019 1651   BASOSABS 0.0 12/16/2018 1444    Lab Results  Component Value Date   HGBA1C 10.6 (A) 12/18/2020    Assessment & Plan:  1. Type 1 diabetes mellitus with hypoglycemia and without coma (HCC) Last A1c was 9.7 from 01/2021 He has had labile sugars in the past He goes to see his endocrinologist this month hence I will defer management to them Counseled on Diabetic diet, my plate method, 161 minutes of moderate intensity exercise/week Blood sugar logs with fasting  goals of 80-120 mg/dl, random of less than 180 and in the event of sugars less than 60 mg/dl or greater than 400 mg/dl encouraged to notify the clinic. Advised on the need for annual eye exams, annual foot exams, Pneumonia vaccine. - Ambulatory referral to Ophthalmology - Ambulatory referral to Podiatry  2. Other male erectile dysfunction - sildenafil (VIAGRA) 50 MG tablet; Take 1 tablet (50 mg total) by mouth daily as needed for erectile dysfunction (At least 24 hours between doses).  Dispense: 10 tablet; Refill: 1  3. Chronic diarrhea Controlled  on Bentyl - dicyclomine (BENTYL) 10 MG capsule; Take 1 capsule (10 mg total) by mouth 2 (two) times daily as needed for spasms.  Dispense: 60 capsule; Refill: 6  4. Callus of foot Counseled on foot care - Ambulatory referral to [place] ordered this encounter  Medications   sildenafil (VIAGRA) 50 MG tablet    Sig: Take 1 tablet (50 mg total) by mouth daily as needed for erectile dysfunction (At least 24 hours between doses).    Dispense:  10 tablet    Refill:  1   dicyclomine (BENTYL) 10 MG capsule    Sig: Take 1 capsule (10 mg total) by mouth 2 (two) times daily as needed for spasms.    Dispense:  60 capsule    Refill:  6    Follow-up: Return in about 6 months (around 12/18/2021) for Medical conditions.       Charlott Rakes, MD, FAAFP. [facility] and [facility] [place], [place]   06/20/2021, 11:11 AM

## 2021-06-20 NOTE — Progress Notes (Signed)
Referral to podiatry Has sore on bottom of toe.

## 2021-06-20 NOTE — Patient Instructions (Signed)

## 2021-06-21 ENCOUNTER — Other Ambulatory Visit: Payer: Self-pay

## 2021-06-25 ENCOUNTER — Other Ambulatory Visit: Payer: Self-pay

## 2021-06-26 ENCOUNTER — Other Ambulatory Visit: Payer: Self-pay

## 2021-06-26 ENCOUNTER — Ambulatory Visit (INDEPENDENT_AMBULATORY_CARE_PROVIDER_SITE_OTHER): Payer: Self-pay | Admitting: Podiatry

## 2021-06-26 DIAGNOSIS — E1065 Type 1 diabetes mellitus with hyperglycemia: Secondary | ICD-10-CM

## 2021-06-26 DIAGNOSIS — M79674 Pain in right toe(s): Secondary | ICD-10-CM

## 2021-06-26 DIAGNOSIS — M79675 Pain in left toe(s): Secondary | ICD-10-CM

## 2021-06-26 DIAGNOSIS — B351 Tinea unguium: Secondary | ICD-10-CM

## 2021-06-28 ENCOUNTER — Encounter: Payer: Self-pay | Admitting: Podiatry

## 2021-06-28 NOTE — Progress Notes (Signed)
  Subjective:  Patient ID: Jared Murray, male    DOB: 02/28/1976,  MRN: [id number]  Chief Complaint  Patient presents with   Diabetes    Foot exam  Place under left hallux    45 y.o. male returns for the above complaint.  Patient presents with thickened elongated dystrophic toenails x10.  Pain with palpation to the nails.  Patient unable to debride down himself.  He would like for me to do it.  He is a diabetic with last A1c of 10.6%.  Denies any other acute issues.  Objective:  There were no vitals filed for this visit. Podiatric Exam: Vascular: dorsalis pedis and posterior tibial pulses are palpable bilateral. Capillary return is immediate. Temperature gradient is WNL. Skin turgor WNL  Sensorium: Normal Semmes Weinstein monofilament test. Normal tactile sensation bilaterally. Nail Exam: Pt has thick disfigured discolored nails with subungual debris noted bilateral entire nail hallux through fifth toenails.  Pain on palpation to the nails. Ulcer Exam: There is no evidence of ulcer or pre-ulcerative changes or infection. Orthopedic Exam: Muscle tone and strength are WNL. No limitations in general ROM. No crepitus or effusions noted.  Skin: No Porokeratosis. No infection or ulcers    Assessment & Plan:   1. Pain due to onychomycosis of toenails of both feet   2. Type 1 diabetes mellitus with hyperglycemia ([place])     Patient was evaluated and treated and all questions answered.  Onychomycosis with pain  -Nails palliatively debrided as below. -Educated on self-care  Procedure: Nail Debridement Rationale: pain  Type of Debridement: manual, sharp debridement. Instrumentation: Nail nipper, rotary burr. Number of Nails: 10  Procedures and Treatment: Consent by patient was obtained for treatment procedures. The patient understood the discussion of treatment and procedures well. All questions were answered thoroughly reviewed. Debridement of mycotic and hypertrophic toenails, 1  through 5 bilateral and clearing of subungual debris. No ulceration, no infection noted.  Return Visit-Office Procedure: Patient instructed to return to the office for a follow up visit 3 months for continued evaluation and treatment.  Boneta Lucks, DPM    Return in about 3 months (around 09/26/2021).

## 2021-07-25 ENCOUNTER — Other Ambulatory Visit: Payer: Self-pay

## 2021-07-25 ENCOUNTER — Other Ambulatory Visit: Payer: Self-pay | Admitting: Family Medicine

## 2021-07-25 DIAGNOSIS — K219 Gastro-esophageal reflux disease without esophagitis: Secondary | ICD-10-CM

## 2021-07-25 MED ORDER — PANTOPRAZOLE SODIUM 40 MG PO TBEC
DELAYED_RELEASE_TABLET | Freq: Every day | ORAL | 3 refills | Status: DC
  Filled 2021-07-25: qty 30, 30d supply, fill #0

## 2021-07-26 ENCOUNTER — Other Ambulatory Visit: Payer: Self-pay

## 2021-08-23 ENCOUNTER — Other Ambulatory Visit (HOSPITAL_COMMUNITY): Payer: Self-pay

## 2021-09-03 ENCOUNTER — Encounter (HOSPITAL_COMMUNITY): Payer: Self-pay

## 2021-09-03 ENCOUNTER — Other Ambulatory Visit: Payer: Self-pay

## 2021-09-03 ENCOUNTER — Emergency Department (HOSPITAL_COMMUNITY)
Admission: EM | Admit: 2021-09-03 | Discharge: 2021-09-03 | Disposition: A | Discharge status: Home / Self Care | Payer: Medicare Other | Attending: Emergency Medicine | Admitting: Emergency Medicine

## 2021-09-03 DIAGNOSIS — R5381 Other malaise: Secondary | ICD-10-CM | POA: Diagnosis not present

## 2021-09-03 DIAGNOSIS — Z794 Long term (current) use of insulin: Secondary | ICD-10-CM | POA: Insufficient documentation

## 2021-09-03 DIAGNOSIS — Z7982 Long term (current) use of aspirin: Secondary | ICD-10-CM | POA: Diagnosis not present

## 2021-09-03 DIAGNOSIS — R3589 Other polyuria: Secondary | ICD-10-CM | POA: Insufficient documentation

## 2021-09-03 DIAGNOSIS — R739 Hyperglycemia, unspecified: Secondary | ICD-10-CM

## 2021-09-03 DIAGNOSIS — U071 COVID-19: Secondary | ICD-10-CM | POA: Diagnosis not present

## 2021-09-03 DIAGNOSIS — E1165 Type 2 diabetes mellitus with hyperglycemia: Secondary | ICD-10-CM | POA: Diagnosis not present

## 2021-09-03 DIAGNOSIS — R197 Diarrhea, unspecified: Secondary | ICD-10-CM | POA: Insufficient documentation

## 2021-09-03 DIAGNOSIS — Z91040 Latex allergy status: Secondary | ICD-10-CM | POA: Diagnosis not present

## 2021-09-03 DIAGNOSIS — R519 Headache, unspecified: Secondary | ICD-10-CM | POA: Diagnosis present

## 2021-09-03 LAB — BASIC METABOLIC PANEL
Anion gap: 7 (ref 5–15)
BUN: 22 mg/dL — ABNORMAL HIGH (ref 6–20)
CO2: 25 mmol/L (ref 22–32)
Calcium: 8.3 mg/dL — ABNORMAL LOW (ref 8.9–10.3)
Chloride: 96 mmol/L — ABNORMAL LOW (ref 98–111)
Creatinine, Ser: 1.26 mg/dL — ABNORMAL HIGH (ref 0.61–1.24)
GFR, Estimated: >60 mL/min (ref >60)
Glucose, Bld: 427 mg/dL — ABNORMAL HIGH (ref 70–99)
Potassium: 3.6 mmol/L (ref 3.5–5.1)
Sodium: 128 mmol/L — ABNORMAL LOW (ref 135–145)

## 2021-09-03 LAB — URINALYSIS, ROUTINE W REFLEX MICROSCOPIC
Bacteria, UA: NONE SEEN
Bilirubin Urine: NEGATIVE
Glucose, UA: >=500 mg/dL — AB
Hgb urine dipstick: NEGATIVE
Ketones, ur: 5 mg/dL — AB
Leukocytes,Ua: NEGATIVE
Nitrite: NEGATIVE
Protein, ur: NEGATIVE mg/dL
Specific Gravity, Urine: 1.027 (ref 1.005–1.030)
pH: 5 (ref 5.0–8.0)

## 2021-09-03 LAB — CBC
HCT: 34.2 % — ABNORMAL LOW (ref 39.0–52.0)
Hemoglobin: 11.4 g/dL — ABNORMAL LOW (ref 13.0–17.0)
MCH: 28.9 pg (ref 26.0–34.0)
MCHC: 33.3 g/dL (ref 30.0–36.0)
MCV: 86.8 fL (ref 80.0–100.0)
Platelets: 241 10*3/uL (ref 150–400)
RBC: 3.94 MIL/uL — ABNORMAL LOW (ref 4.22–5.81)
RDW: 13.7 % (ref 11.5–15.5)
WBC: 2.1 10*3/uL — ABNORMAL LOW (ref 4.0–10.5)
nRBC: 0 % (ref 0.0–0.2)

## 2021-09-03 LAB — BLOOD GAS, VENOUS
Acid-Base Excess: 0.2 mmol/L (ref 0.0–2.0)
Bicarbonate: 26.2 mmol/L (ref 20.0–28.0)
FIO2: 21
O2 Saturation: 74.3 %
Patient temperature: 37
pCO2, Ven: 51.4 mmHg (ref 44.0–60.0)
pH, Ven: 7.327 (ref 7.250–7.430)
pO2, Ven: 44.9 mmHg (ref 32.0–45.0)

## 2021-09-03 LAB — RESP PANEL BY RT-PCR (FLU A&B, COVID) ARPGX2
Influenza A by PCR: NEGATIVE
Influenza B by PCR: NEGATIVE
SARS Coronavirus 2 by RT PCR: POSITIVE — AB

## 2021-09-03 LAB — CBG MONITORING, ED
Glucose-Capillary: 439 mg/dL — ABNORMAL HIGH (ref 70–99)
Glucose-Capillary: 392 mg/dL — ABNORMAL HIGH (ref 70–99)
Glucose-Capillary: 348 mg/dL — ABNORMAL HIGH (ref 70–99)
Glucose-Capillary: 313 mg/dL — ABNORMAL HIGH (ref 70–99)

## 2021-09-03 LAB — BETA-HYDROXYBUTYRIC ACID: Beta-Hydroxybutyric Acid: 0.13 mmol/L (ref 0.05–0.27)

## 2021-09-03 MED ORDER — INSULIN ASPART 100 UNIT/ML IJ SOLN
4.0000 [IU] | Freq: Once | INTRAMUSCULAR | Status: AC
  Administered 2021-09-03: 4 [IU] via SUBCUTANEOUS
  Filled 2021-09-03: qty 0.04

## 2021-09-03 MED ORDER — SODIUM CHLORIDE 0.9 % IV BOLUS
1000.0000 mL | Freq: Once | INTRAVENOUS | Status: AC
  Administered 2021-09-03: 1000 mL via INTRAVENOUS

## 2021-09-03 MED ORDER — ONDANSETRON 4 MG PO TBDP: 4.0000 mg | ORAL_TABLET | Freq: Three times a day (TID) | ORAL | 0 refills | Status: DC | PRN

## 2021-09-03 MED ORDER — NIRMATRELVIR/RITONAVIR (PAXLOVID)TABLET: 3.0000 | ORAL_TABLET | Freq: Two times a day (BID) | ORAL | 0 refills | Status: AC

## 2021-09-03 NOTE — ED Provider Triage Note (Signed)
Emergency Medicine Provider Triage Evaluation Note  Jared Murray , a 46 y.o. male  was evaluated in triage.  Pt complains of diarrhea and hyperglycemia.  States that the symptoms have been present over the last few days.  Patient states that he has had decreased p.o. intake and has been taking his insulin however his glucometer has been reading high.  Patient reports that yesterday he had a syncopal episode.  Patient began feeling lightheaded while sitting on the couch and had a syncopal episode that lasted for a few seconds.  Denies any falls or injuries associated with this.  No preceding chest pain or shortness of breath.  Patient reports that his granddaughter recently survived over COVID-19.  Endorses close contact with his granddaughter.  Review of Systems  Positive: Diarrhea, hypoglycemia, lightheadedness, syncope, decreased p.o. intake, chills Negative: Fever, rhinorrhea, nasal congestion, sore throat, cough, abdominal pain, nausea, vomiting, blood in stool, melena, dysuria, hematuria, urinary urgency, chest pain, shortness of breath  Physical Exam  BP 102/63 (BP Location: Right Arm)    Pulse 79    Temp 97.6 F (36.4 C) (Oral)    Resp 16    Ht 5\' 10"  (1.778 m)    Wt 59 kg    SpO2 100%    BMI 18.65 kg/m  Gen:   Awake, no distress   Resp:  Normal effort  MSK:   Moves extremities without difficulty  Other:  Abdomen soft, nondistended, nontender, no guarding or rebound tenderness.  Medical Decision Making  Medically screening exam initiated at 1:24 PM.  Appropriate orders placed.  Jared Murray was informed that the remainder of the evaluation will be completed by another provider, this initial triage assessment does not replace that evaluation, and the importance of remaining in the ED until their evaluation is complete.     Loni Beckwith, [place] 09/03/21 1325

## 2021-09-03 NOTE — ED Notes (Signed)
Pt is aware that a urine sample is needed, urinal given. 

## 2021-09-03 NOTE — ED Provider Notes (Signed)
[facility] EMERGENCY DEPARTMENT Provider Note    CSN: [id number] Arrival date & time: 09/03/21 1251  History Chief Complaint  Patient presents with   Hyperglycemia   Diarrhea   Loss of Consciousness    Jared Murray is a 46 y.o. male with history of DM reports he has been feeling poorly for the last 2 days with poor appetite, headache and general malaise. No fever, cough, congestion. No nausea or vomiting but has had little PO intake. He has chronic diarrhea and reports his stools have been less watery in the last two days. He has a continuous glucose monitor which has been reporting elevated sugars since yesterday. He reports he was dizzy with standing last night and had a brief syncopal episode. No CP, SOB, abdominal pain. He has had polyuria.    Home Medications Prior to Admission medications   Medication Sig Start Date End Date Taking? Authorizing Provider  nirmatrelvir/ritonavir EUA (PAXLOVID) 20 x 150 MG & 10 x 100MG TABS Take 3 tablets by mouth 2 (two) times daily for 5 days. Take nirmatrelvir (150 mg) two tablets twice daily for 5 days and ritonavir (100 mg) one tablet twice daily for 5 days. 09/03/21 09/08/21 Yes Truddie Hidden, MD  ondansetron (ZOFRAN-ODT) 4 MG disintegrating tablet Take 1 tablet (4 mg total) by mouth every 8 (eight) hours as needed for nausea or vomiting. 09/03/21  Yes Truddie Hidden, MD  Accu-Chek Softclix Lancets lancets Use as instructed 3 times daily. Dx: Type 1 DM 01/26/19   Charlott Rakes, MD  aspirin-sod bicarb-citric acid (ALKA-SELTZER) 325 MG TBEF tablet Take 325 mg by mouth every 6 (six) hours as needed (heartburn).    [provider]  atorvastatin (LIPITOR) 20 MG tablet TAKE 1 TABLET (20 MG TOTAL) BY MOUTH DAILY. 04/24/21 04/24/22  Charlott Rakes, MD  Blood Glucose Monitoring Suppl (ACCU-CHEK AVIVA PLUS) w/Device KIT TEST 3 TIMES DAILY 10/19/18   Charlott Rakes, MD  clobetasol cream (TEMOVATE) 7.51 % APPLY 1 APPLICATION TOPICALLY 2  (TWO) TIMES DAILY AS NEEDED (RASH). 12/21/20 12/21/21  Charlott Rakes, MD  dicyclomine (BENTYL) 10 MG capsule Take 1 capsule (10 mg total) by mouth 2 (two) times daily as needed for spasms. 06/20/21   Charlott Rakes, MD  glucose blood (ACCU-CHEK AVIVA PLUS) test strip USE TO CHECK BLOOD SUGAR FOUR TIMES DAILY BEFORE MEALS AND AT BEDTIME 01/26/19   Charlott Rakes, MD  insulin aspart (NOVOLOG) 100 UNIT/ML injection Inject 0-12 units into the skin three times daily with meals as per sliding scale 02/15/21 02/15/22  Charlott Rakes, MD  Insulin Glargine (BASAGLAR KWIKPEN) 100 UNIT/ML INJECT 12 UNITS INTO THE SKIN DAILY. 06/14/21 06/14/22  Charlott Rakes, MD  INSULIN SYRINGE 1CC/29G 29G X 1/2" 1 ML MISC use as directed 02/15/21   Charlott Rakes, MD  mupirocin ointment (BACTROBAN) 2 % Apply 1 application topically 2 (two) times daily to open area on the scalp. 03/20/21     pantoprazole (PROTONIX) 40 MG tablet TAKE 1 TABLET (40 MG TOTAL) BY MOUTH DAILY. 07/25/21 07/25/22  Charlott Rakes, MD  sildenafil (VIAGRA) 50 MG tablet Take 1 tablet (50 mg total) by mouth daily as needed for erectile dysfunction (At least 24 hours between doses). 06/20/21   Charlott Rakes, MD  Tetrahydrozoline HCl (VISINE OP) Place 1 drop into both eyes daily as needed (dryness / allergies).    [provider]     Allergies    Bee venom, Latex, and Penicillins   Review of Systems  Review of Systems Please see HPI for pertinent positives and negatives  Physical Exam BP (!) 137/98    Pulse 76    Temp 97.6 F (36.4 C) (Oral)    Resp 18    Ht _0  (1.778 m)    Wt 59 kg    SpO2 100%    BMI 18.65 kg/m   Physical Exam Vitals and nursing note reviewed.  Constitutional:      Appearance: Normal appearance.  HENT:     Head: Normocephalic and atraumatic.     Nose: Nose normal.     Mouth/Throat:     Mouth: Mucous membranes are dry.  Eyes:     Extraocular Movements: Extraocular movements intact.     Conjunctiva/sclera:  Conjunctivae normal.  Cardiovascular:     Rate and Rhythm: Normal rate.  Pulmonary:     Effort: Pulmonary effort is normal.     Breath sounds: Normal breath sounds.  Abdominal:     General: Abdomen is flat.     Palpations: Abdomen is soft.     Tenderness: There is no abdominal tenderness.  Musculoskeletal:        General: No swelling. Normal range of motion.     Cervical back: Neck supple.  Skin:    General: Skin is warm and dry.  Neurological:     General: No focal deficit present.     Mental Status: He is alert.  Psychiatric:        Mood and Affect: Mood normal.    ED Results / Procedures / Treatments   EKG EKG Interpretation  Date/Time:  Tuesday September 03 2021 13:05:19 EST Ventricular Rate:  78 PR Interval:  147 QRS Duration: 131 QT Interval:  400 QTC Calculation: 456 R Axis:   91 Text Interpretation: Sinus rhythm Consider right atrial enlargement Right bundle branch block ST elevation suggests acute pericarditis No significant change since last tracing Confirmed by Calvert Cantor [phone]) on 09/03/2021 6:22:03 PM  Procedures Procedures  Medications Ordered in the ED Medications  sodium chloride 0.9 % bolus 1,000 mL (0 mLs Intravenous Stopped 09/03/21 1716)  insulin aspart (novoLOG) injection 4 Units (4 Units Subcutaneous Given 09/03/21 1750)    Initial Impression and Plan  Patient with DM here with general malaise and high sugars. He is overall well appearing. Labs done in triage reviewed, CBC shows low WBC, mild anemia, normal PLT. BMP shows elevated glucose, mild hyponatremia, slight increase in Cr from baseline. No acidosis and normal anion gap. HE has a VBG and beta-hydroxy that were both normal. Covid test was positive which likely explains his symptoms. Will give a bag of IVF, he reports his appetite has improved and he would like to try to drink some fluids. CBG is improving on recheck, will check again after IVF.   ED Course   Clinical Course as of 09/03/21  1833  Tue Sep 03, 2021  1724 CBG improving some. Awaiting UA. Will continue to encourage PO fluids in the meantime.  [CS]  1743 Patient states he would usually take 4Units on his sliding scale for this level of hyperglycemia. He reports he is unvaccinated, so will plan discharge with Rx for Paxlovid when UA is done.  [CS]  7026 UA with glucosuria, but no signs of infection. As above, will d/c home with Paxlovid, Zofran, continue home insulin.  [CS]  1819 Patient with prior prolonged QT, will check EKG before prescribing Zofran. [CS]    Clinical Course User Index [CS] Truddie Hidden, MD  MDM Rules/Calculators/A&P Medical Decision Making Problems Addressed: COVID-19: acute illness or injury that poses a threat to life or bodily functions Hyperglycemia: acute illness or injury that poses a threat to life or bodily functions  Amount and/or Complexity of Data Reviewed Labs: ordered. Decision-making details documented in ED Course. ECG/medicine tests: ordered and independent interpretation performed. Decision-making details documented in ED Course.  Risk Prescription drug management. Drug therapy requiring intensive monitoring for toxicity. Decision regarding hospitalization.    Final Clinical Impression(s) / ED Diagnoses Final diagnoses:  COVID-19  Hyperglycemia    Rx / DC Orders ED Discharge Orders          Ordered    nirmatrelvir/ritonavir EUA (PAXLOVID) 20 x 150 MG & 10 x 100MG TABS  2 times daily       Note to Pharmacy: GFR >60   09/03/21 1824    ondansetron (ZOFRAN-ODT) 4 MG disintegrating tablet  Every 8 hours PRN        09/03/21 1824             Truddie Hidden, MD 09/03/21 [phone]

## 2021-09-03 NOTE — ED Triage Notes (Addendum)
Patient c/o hyperglycemia, diarrhea x 2 days. Patient also reports a syncopal episode yesterday. Patient denies any abdominal pain. Patient states his Dexcom has been reading "high."

## 2021-09-03 NOTE — ED Notes (Signed)
Pt in bed, pt states that he is ready to go home, pt verbalized understanding d/c instructions and follow up.

## 2021-09-19 ENCOUNTER — Other Ambulatory Visit: Payer: Self-pay

## 2021-09-19 ENCOUNTER — Other Ambulatory Visit: Payer: Self-pay | Admitting: Family Medicine

## 2021-09-19 DIAGNOSIS — E1065 Type 1 diabetes mellitus with hyperglycemia: Secondary | ICD-10-CM

## 2021-09-19 MED ORDER — INSULIN ASPART 100 UNIT/ML IJ SOLN
INTRAMUSCULAR | 2 refills | Status: DC
  Filled 2021-09-19: qty 10, 28d supply, fill #0
  Filled 2021-10-31 – 2021-11-01 (×2): qty 10, 28d supply, fill #1

## 2021-09-19 NOTE — Telephone Encounter (Signed)
Requested Prescriptions  Pending Prescriptions Disp Refills   insulin aspart (NOVOLOG) 100 UNIT/ML injection 10 mL 2    Sig: Inject 0-12 units into the skin three times daily with meals as per sliding scale     Endocrinology:  Diabetes - Insulins Failed - 09/19/2021 12:14 PM      Failed - HBA1C is between 0 and 7.9 and within 180 days    HbA1c, POC (controlled diabetic range)  Date Value Ref Range Status  12/18/2020 10.6 (A) 0.0 - 7.0 % Final         Passed - Valid encounter within last 6 months    Recent Outpatient Visits          3 months ago Type 1 diabetes mellitus with hypoglycemia and without coma ([place])   [place], [place], MD   9 months ago Type 1 diabetes mellitus with hypoglycemia and without coma ([place])   [place], [place], MD   1 year ago Type 1 diabetes mellitus with hypoglycemia and without coma ([place])   [place], [place], MD   1 year ago Type 1 diabetes mellitus with hypoglycemia and without coma ([place])   [place], Charlane Ferretti, MD   1 year ago Type 1 diabetes mellitus with hyperglycemia [facility])   [place], [place], MD      Future Appointments            In 3 weeks [place], Dionne Bucy, PA-C [place]   In 3 months Charlott Rakes, MD [place]

## 2021-09-20 ENCOUNTER — Other Ambulatory Visit: Payer: Self-pay

## 2021-09-25 DIAGNOSIS — H524 Presbyopia: Secondary | ICD-10-CM | POA: Diagnosis not present

## 2021-09-25 DIAGNOSIS — E109 Type 1 diabetes mellitus without complications: Secondary | ICD-10-CM | POA: Diagnosis not present

## 2021-10-02 ENCOUNTER — Other Ambulatory Visit: Payer: Self-pay

## 2021-10-02 ENCOUNTER — Encounter: Payer: Self-pay | Admitting: Podiatry

## 2021-10-02 ENCOUNTER — Ambulatory Visit (INDEPENDENT_AMBULATORY_CARE_PROVIDER_SITE_OTHER): Payer: Medicare Other | Admitting: Podiatry

## 2021-10-02 DIAGNOSIS — M79675 Pain in left toe(s): Secondary | ICD-10-CM | POA: Diagnosis not present

## 2021-10-02 DIAGNOSIS — E1065 Type 1 diabetes mellitus with hyperglycemia: Secondary | ICD-10-CM | POA: Diagnosis not present

## 2021-10-02 DIAGNOSIS — B351 Tinea unguium: Secondary | ICD-10-CM | POA: Diagnosis not present

## 2021-10-02 DIAGNOSIS — M79674 Pain in right toe(s): Secondary | ICD-10-CM

## 2021-10-02 NOTE — Progress Notes (Signed)
This patient returns to my office for at risk foot care.  This patient requires this care by a professional since this patient will be at risk due to having  diabetes.  This patient is unable to cut nails himself since the patient cannot reach his nails.These nails are painful walking and wearing shoes.  This patient presents for at risk foot care today.  General Appearance  Alert, conversant and in no acute stress.  Vascular  Dorsalis pedis and posterior tibial  pulses are palpable  bilaterally.  Capillary return is within normal limits  bilaterally. Temperature is within normal limits  bilaterally.  Neurologic  Senn-Weinstein monofilament wire test within normal limits  bilaterally. Muscle power within normal limits bilaterally.  Nails Thick disfigured discolored nails with subungual debris  from hallux to fifth toes bilaterally. No evidence of bacterial infection or drainage bilaterally.  Orthopedic  No limitations of motion  feet .  No crepitus or effusions noted.  No bony pathology or digital deformities noted.  Skin  normotropic skin with no porokeratosis noted bilaterally.  No signs of infections or ulcers noted.     Onychomycosis  Pain in right toes  Pain in left toes  Consent was obtained for treatment procedures.   Mechanical debridement of nails 1-5  bilaterally performed with a nail nipper.  Filed with dremel without incident.    Return office visit   3 months                   Told patient to return for periodic foot care and evaluation due to potential at risk complications.   Yacoub Diltz DPM   

## 2021-10-10 ENCOUNTER — Ambulatory Visit: Payer: Medicare Other | Attending: Physician Assistant | Admitting: Physician Assistant

## 2021-10-10 ENCOUNTER — Other Ambulatory Visit: Payer: Self-pay

## 2021-10-10 ENCOUNTER — Encounter: Payer: Self-pay | Admitting: Physician Assistant

## 2021-10-10 VITALS — BP 99/68 | HR 88 | Resp 18 | Ht 70.0 in | Wt 135.2 lb

## 2021-10-10 DIAGNOSIS — E1065 Type 1 diabetes mellitus with hyperglycemia: Secondary | ICD-10-CM | POA: Diagnosis not present

## 2021-10-10 DIAGNOSIS — K219 Gastro-esophageal reflux disease without esophagitis: Secondary | ICD-10-CM | POA: Diagnosis not present

## 2021-10-10 DIAGNOSIS — U071 COVID-19: Secondary | ICD-10-CM

## 2021-10-10 DIAGNOSIS — Z8616 Personal history of COVID-19: Secondary | ICD-10-CM | POA: Insufficient documentation

## 2021-10-10 DIAGNOSIS — Z794 Long term (current) use of insulin: Secondary | ICD-10-CM | POA: Insufficient documentation

## 2021-10-10 DIAGNOSIS — E10649 Type 1 diabetes mellitus with hypoglycemia without coma: Secondary | ICD-10-CM

## 2021-10-10 DIAGNOSIS — Z7182 Exercise counseling: Secondary | ICD-10-CM | POA: Insufficient documentation

## 2021-10-10 DIAGNOSIS — Z09 Encounter for follow-up examination after completed treatment for conditions other than malignant neoplasm: Secondary | ICD-10-CM | POA: Diagnosis not present

## 2021-10-10 DIAGNOSIS — Z79899 Other long term (current) drug therapy: Secondary | ICD-10-CM | POA: Diagnosis not present

## 2021-10-10 LAB — GLUCOSE, POCT (MANUAL RESULT ENTRY): POC Glucose: 202 mg/dl — AB (ref 70–99)

## 2021-10-10 MED ORDER — PANTOPRAZOLE SODIUM 40 MG PO TBEC
DELAYED_RELEASE_TABLET | Freq: Every day | ORAL | 3 refills | Status: DC
  Filled 2021-10-10 – 2021-11-01 (×3): qty 30, 30d supply, fill #0
  Filled 2022-02-13: qty 30, 30d supply, fill #1
  Filled 2022-04-04 – 2022-05-30 (×2): qty 30, 30d supply, fill #2

## 2021-10-10 MED ORDER — ATORVASTATIN CALCIUM 20 MG PO TABS
ORAL_TABLET | Freq: Every day | ORAL | 5 refills | Status: DC
  Filled 2021-10-10 – 2022-02-13 (×2): qty 30, 30d supply, fill #0
  Filled 2022-04-04 – 2022-05-30 (×2): qty 30, 30d supply, fill #1
  Filled 2022-09-19: qty 30, 30d supply, fill #2

## 2021-10-10 NOTE — Progress Notes (Signed)
Patient ID: Jared Murray, male   DOB: May 22, 1976, 46 y.o.   MRN: [id number]   Jared Murray, is a 47 y.o. male  WUJ:[id number]  [id number]  DOB - [birth year]  Chief Complaint  Patient presents with   Hospitalization Follow-up       Subjective:   Jared Murray is a 46 y.o. male here today for a follow up visit after being hospitalized in January for Covid.  He says he really only felt bad for about 1 day.   Feeling fine now.  He is taking basaglar 12 units and novolog SS.  He was going to [facility] for diabetes management but has not seen them since 01/2021.  Blood sugars ranging from 90-400 over the last few weeks.     No problems updated.  ALLERGIES: Allergies  Allergen Reactions   Bee Venom Swelling   Latex Itching and Rash   Penicillins Other (See Comments)    Childhood allergy Has patient had a PCN reaction causing immediate rash, facial/tongue/throat swelling, SOB or lightheadedness with hypotension: NO Has patient had a PCN reaction causing severe rash involving mucus membranes or skin necrosis:NO Has patient had a PCN reaction that required hospitalization NO Has patient had a PCN reaction occurring within the last 10 years: NO If all of the above answers are "NO", then may proceed with Cephalosporin use.    PAST MEDICAL HISTORY: Past Medical History:  Diagnosis Date   Acute pericarditis 03/31/2016   AKI (acute kidney injury) ([place]) 01/22/2015   Allergy    Anxiety    per pt   Arthritis    Avascular necrosis ([place]) 02/12/2015   Avascular necrosis of bone of hip ([place])    Chest wall abscess 04/03/2015   Chronic diarrhea of unknown origin 09/10/2017   Cutaneous abscess of chest wall    Depression    per pt   DKA (diabetic ketoacidoses)    DKA, type 1, not at goal [facility]) 04/03/2015   Eczema    Elevated liver enzymes 11/27/2018   GERD (gastroesophageal reflux disease)    HLD (hyperlipidemia) 11/25/2018   no meds   Hyperbilirubinemia    Hyperglycemia due to  type 1 diabetes mellitus ([place])    Hyperkalemia    Hypokalemia 03/17/2013   Left hip pain 04/18/2014   Loss of weight 03/17/2013   Marijuana use 11/25/2018   Osteoarthritis 12/24/2016   Prolonged QT interval 11/27/2018   in setting of RBBB and hypokalemia   Protein-calorie malnutrition, severe ([place]) 03/17/2013   Psoriasis    RBBB 12/23/2018   Chronic since at least 2014   Sepsis ([place]) 01/22/2015   Substance abuse ([place])    Tobacco use disorder 09/13/2015   Type 1 diabetes mellitus ([place])     MEDICATIONS AT HOME: Prior to Admission medications   Medication Sig Start Date End Date Taking? Authorizing Provider  Accu-Chek Softclix Lancets lancets Use as instructed 3 times daily. Dx: Type 1 DM 01/26/19  Yes Newlin, Enobong, MD  aspirin-sod bicarb-citric acid (ALKA-SELTZER) 325 MG TBEF tablet Take 325 mg by mouth every 6 (six) hours as needed (heartburn).   Yes [provider]  Blood Glucose Monitoring Suppl (ACCU-CHEK AVIVA PLUS) w/Device KIT TEST 3 TIMES DAILY 10/19/18  Yes Newlin, Enobong, MD  clobetasol cream (TEMOVATE) 4.69 % APPLY 1 APPLICATION TOPICALLY 2 (TWO) TIMES DAILY AS NEEDED (RASH). 12/21/20 12/21/21 Yes Charlott Rakes, MD  dicyclomine (BENTYL) 10 MG capsule Take 1 capsule (10 mg total) by mouth 2 (two) times daily as needed  for spasms. 06/20/21  Yes Newlin, Charlane Ferretti, MD  glucose blood (ACCU-CHEK AVIVA PLUS) test strip USE TO CHECK BLOOD SUGAR FOUR TIMES DAILY BEFORE MEALS AND AT BEDTIME 01/26/19  Yes Newlin, Enobong, MD  insulin aspart (NOVOLOG) 100 UNIT/ML injection Inject 0-12 units into the skin three times daily with meals as per sliding scale 09/19/21 09/19/22 Yes Newlin, Enobong, MD  Insulin Glargine (BASAGLAR KWIKPEN) 100 UNIT/ML INJECT 12 UNITS INTO THE SKIN DAILY. 06/14/21 06/14/22 Yes Charlott Rakes, MD  INSULIN SYRINGE 1CC/29G 29G X 1/2" 1 ML MISC use as directed 02/15/21  Yes Newlin, Enobong, MD  mupirocin ointment (BACTROBAN) 2 % Apply 1 application topically 2 (two) times daily to  open area on the scalp. 03/20/21  Yes   ondansetron (ZOFRAN-ODT) 4 MG disintegrating tablet Take 1 tablet (4 mg total) by mouth every 8 (eight) hours as needed for nausea or vomiting. 09/03/21  Yes Truddie Hidden, MD  sildenafil (VIAGRA) 50 MG tablet Take 1 tablet (50 mg total) by mouth daily as needed for erectile dysfunction (At least 24 hours between doses). 06/20/21  Yes Charlott Rakes, MD  Tetrahydrozoline HCl (VISINE OP) Place 1 drop into both eyes daily as needed (dryness / allergies).   Yes [provider]  atorvastatin (LIPITOR) 20 MG tablet TAKE 1 TABLET (20 MG TOTAL) BY MOUTH DAILY. 10/10/21 10/10/22  Argentina Donovan, PA-C  pantoprazole (PROTONIX) 40 MG tablet TAKE 1 TABLET (40 MG TOTAL) BY MOUTH DAILY. 10/10/21 10/10/22  Argentina Donovan, PA-C    ROS: Neg HEENT Neg resp Neg cardiac Neg GI Neg GU Neg MS Neg psych Neg neuro  Objective:   Vitals:   10/10/21 1458  BP: 99/68  Pulse: 88  Resp: 18  SpO2: 100%  Weight: 135 lb 4 oz (61.3 kg)  Height: 5' 10" (1.778 m)   Exam General appearance : Awake, alert, not in any distress. Speech Clear. Not toxic looking HEENT: Atraumatic and Normocephalic Neck: Supple, no JVD. No cervical lymphadenopathy.  Chest: Good air entry bilaterally, CTAB.  No rales/rhonchi/wheezing CVS: S1 S2 regular, no murmurs.  Extremities: B/L Lower Ext shows no edema, both legs are warm to touch Neurology: Awake alert, and oriented X 3, CN II-XII intact, Non focal Skin: No Rash  Data Review Lab Results  Component Value Date   HGBA1C 10.6 (A) 12/18/2020   HGBA1C 11.6 (A) 05/16/2020   HGBA1C 12.1 (A) 02/14/2020    Assessment & Plan   1. Type 1 diabetes mellitus with hyperglycemia (HCC) Will send new Rx once A1C is in.  He still has meds.  Check blood sugars 3-4 times daily on new doses I send and f/up with Lurena Joiner in 3 weeks.  He prefers we refer locally if he needs endocrine to follow - Glucose (CBG) - Hemoglobin A1c - atorvastatin  (LIPITOR) 20 MG tablet; TAKE 1 TABLET (20 MG TOTAL) BY MOUTH DAILY.  Dispense: 30 tablet; Refill: 5 - Comprehensive metabolic panel  2. Type 1 diabetes mellitus with hypoglycemia and without coma (HCC) See#1 - atorvastatin (LIPITOR) 20 MG tablet; TAKE 1 TABLET (20 MG TOTAL) BY MOUTH DAILY.  Dispense: 30 tablet; Refill: 5  3. Gastroesophageal reflux disease without esophagitis - pantoprazole (PROTONIX) 40 MG tablet; TAKE 1 TABLET (40 MG TOTAL) BY MOUTH DAILY.  Dispense: 30 tablet; Refill: 3  4. COVID resolved  5. Hospital discharge follow-up resolved    Patient have been counseled extensively about nutrition and exercise. Other issues discussed during this visit include: low cholesterol diet, weight  control and daily exercise, foot care, annual eye examinations at Ophthalmology, importance of adherence with medications and regular follow-up. We also discussed long term complications of uncontrolled diabetes and hypertension.   Return for [facility] 3 weeks DM /PCP in 3 months.  The patient was given clear instructions to go to ER or return to medical center if symptoms don't improve, worsen or new problems develop. The patient verbalized understanding. The patient was told to call to get lab results if they haven't heard anything in the next week.      Freeman Caldron, PA-C [facility] [place], [place]   10/10/2021, 3:20 PM

## 2021-10-10 NOTE — Patient Instructions (Signed)
Check blood sugars 3 to 4 times daily and record.  I will make medication adjustments when I get your labs back

## 2021-10-10 NOTE — Progress Notes (Signed)
co

## 2021-10-11 ENCOUNTER — Other Ambulatory Visit: Payer: Self-pay | Admitting: Physician Assistant

## 2021-10-11 ENCOUNTER — Other Ambulatory Visit: Payer: Self-pay

## 2021-10-11 DIAGNOSIS — E10649 Type 1 diabetes mellitus with hypoglycemia without coma: Secondary | ICD-10-CM

## 2021-10-11 LAB — COMPREHENSIVE METABOLIC PANEL
ALT: 27 IU/L (ref 0–44)
AST: 30 IU/L (ref 0–40)
Albumin/Globulin Ratio: 2 (ref 1.2–2.2)
Albumin: 4.4 g/dL (ref 4.0–5.0)
Alkaline Phosphatase: 160 IU/L — ABNORMAL HIGH (ref 44–121)
BUN/Creatinine Ratio: 13 (ref 9–20)
BUN: 15 mg/dL (ref 6–24)
Bilirubin Total: 0.6 mg/dL (ref 0.0–1.2)
CO2: 23 mmol/L (ref 20–29)
Calcium: 9.2 mg/dL (ref 8.7–10.2)
Chloride: 105 mmol/L (ref 96–106)
Creatinine, Ser: 1.17 mg/dL (ref 0.76–1.27)
Globulin, Total: 2.2 g/dL (ref 1.5–4.5)
Glucose: 133 mg/dL — ABNORMAL HIGH (ref 70–99)
Potassium: 4.2 mmol/L (ref 3.5–5.2)
Sodium: 140 mmol/L (ref 134–144)
Total Protein: 6.6 g/dL (ref 6.0–8.5)
eGFR: 78 mL/min/{1.73_m2} (ref >59)

## 2021-10-11 LAB — HEMOGLOBIN A1C
Est. average glucose Bld gHb Est-mCnc: 341 mg/dL
Hgb A1c MFr Bld: 13.5 % — ABNORMAL HIGH (ref 4.8–5.6)

## 2021-10-11 MED ORDER — BASAGLAR KWIKPEN 100 UNIT/ML ~~LOC~~ SOPN
18.0000 [IU] | PEN_INJECTOR | Freq: Every day | SUBCUTANEOUS | 3 refills | Status: DC
  Filled 2021-10-11 – 2021-11-01 (×3): qty 15, 83d supply, fill #0
  Filled 2022-02-13: qty 15, 83d supply, fill #1
  Filled 2022-05-30: qty 15, 83d supply, fill #2

## 2021-10-17 ENCOUNTER — Other Ambulatory Visit: Payer: Self-pay

## 2021-10-31 ENCOUNTER — Other Ambulatory Visit: Payer: Self-pay

## 2021-11-01 ENCOUNTER — Other Ambulatory Visit: Payer: Self-pay

## 2021-11-06 ENCOUNTER — Telehealth: Payer: Self-pay | Admitting: *Deleted

## 2021-11-06 NOTE — Telephone Encounter (Signed)
Copied from [place] [id number]. Topic: General - Other ?>> Nov 04, 2021 10:53 AM Leward Quan A wrote: ?Reason for CRM: Paulette with [facility] called to inquire of Dr Evie Lacks about office notes requested so that patient can receive the Dexcom G6 asking for these office notes ASAP please fax to [phone] Jeanne Ivan can be reached at Ph# [phone] ?

## 2021-11-06 NOTE — Telephone Encounter (Signed)
Office notes has been faxed over to number provided. 

## 2021-11-08 NOTE — Progress Notes (Signed)
? ? ?S: ?PCP: Dr. Margarita Rana ? ?Jared Murray is a 46 y.o. male who presents for diabetes evaluation, education, and management. PMH is significant for T1DM, DKA, HLD.  Patient was referred and last seen by Freeman Caldron, PA-C, on 10/10/21. At that visit, A1c was 13.5, and Basaglar was increased. Previously followed for DM in [place] but had not been seen since 01/2021.  ? ?Today, he arrives in good spirits and presents without assistance. Reports that he did not feel good when he increased Basaglar to 18 units. BG dropped to the 70s. Uses Novolog sliding scale as below. Uses 3 units most often (BG 301-350) with max 6 units lately (BG 401-450).  ? ?Family/Social History:  ?-HTN in father ?-Former smoker  ? ?DM diagnosed in 2014.  ? ?Current diabetes medications include: Basaglar 15 units daily, Novolog 0-8 units TIDAC per sliding scale (<150: 0 units, 151-200: 1 unit, 201-250: 2 units, 251-300: 3 units, 301-350: 4 units, 351-400: 5 units, 401-450: 6 units, 451-500: 7 units, 500-550: 8 units) ?Current hyperlipidemia medications include: atorvastatin 20 mg daily ? ?Patient states that he is taking his medications as prescribed with the exception of decreasing Basaglar to 15 units (prescribed 18 units but experienced lows). Patient reports adherence with medications. Patient states that he does not miss doses of his medication.  ? ?Do you feel that your medications are working for you? yes ?Have you been experiencing any side effects to the medications prescribed? no ?Insurance coverage: Medicare + Medicaid ? ?Patient reports hypoglycemic events when taking 18 units of Basaglar.  ? ?Patient reports nocturia (nighttime urination). 1x/night.  ?Patient denies neuropathy (nerve pain). ?Patient reports visual changes. Went to the eye doctor recently and got a Rx for readers.  ?Patient reports self foot exams. Has a podiatrist.  ? ?Patient reported dietary habits: Reports eating carbs with his meat at meals. Tries to eat  less of rice, pasta. Wife cooks. Reports dietary indiscretions.  ? ?Patient-reported exercise habits: limited by hip replacements, tries to walk often  ? ?O:  ?BG 211 currently per Dexcom (fasting). Most of day spent in 300s-400s.  ? ?Lab Results  ?Component Value Date  ? HGBA1C 13.5 (H) 10/10/2021  ? ?There were no vitals filed for this visit. ? ?Lipid Panel  ?   ?Component Value Date/Time  ? CHOL 115 12/18/2020 1104  ? TRIG 52 12/18/2020 1104  ? HDL 39 (L) 12/18/2020 1104  ? CHOLHDL 2.9 12/18/2020 1104  ? CHOLHDL 2.3 12/19/2015 0947  ? VLDL 12 12/19/2015 0947  ? [place] 64 12/18/2020 1104  ? ? ?Clinical Atherosclerotic Cardiovascular Disease (ASCVD): No  ?The ASCVD Risk score (Arnett DK, et al., 2019) failed to calculate for the following reasons: ?  The valid total cholesterol range is 130 to 320 mg/dL  ? ? ?A/P: ?Diabetes longstanding currently uncontrolled with most recent A1c 13.5 (10/10/21). Patient is able to verbalize appropriate hypoglycemia management plan. Medication adherence appears appropriate. Basal to bolus ratio is not currently 1:1. Control is suboptimal due to requiring more bolus insulin to prevent increasing to the 400s after eating. ?-Continue Basaglar 15 units daily.  ?-Adjust Novolog to 4 units TIDAC. Skip if BG <150. If BG >350 take 6 units instead of 4.  ?-Extensively discussed pathophysiology of diabetes, recommended lifestyle interventions, dietary effects on blood sugar control.  ?-Counseled on s/sx of and management of hypoglycemia.  ?-Next A1c anticipated May 2023.  ? ?Written patient instructions provided. Patient verbalized understanding of treatment plan. Total time  in face to face counseling 46 minutes.   ? ?Follow up Pharmacist visit in 2-3 weeks. Next PCP clinic visit on 12/19/21.  ? ?Rebbeca Paul, PharmD ?PGY2 Ambulatory Care Pharmacy Resident ?11/11/2021 9:42 AM ?

## 2021-11-11 ENCOUNTER — Other Ambulatory Visit: Payer: Self-pay

## 2021-11-11 ENCOUNTER — Ambulatory Visit: Payer: Medicare Other | Attending: Family Medicine | Admitting: Pharmacist

## 2021-11-11 DIAGNOSIS — Z794 Long term (current) use of insulin: Secondary | ICD-10-CM | POA: Diagnosis not present

## 2021-11-11 DIAGNOSIS — Z87891 Personal history of nicotine dependence: Secondary | ICD-10-CM | POA: Insufficient documentation

## 2021-11-11 DIAGNOSIS — E1065 Type 1 diabetes mellitus with hyperglycemia: Secondary | ICD-10-CM

## 2021-11-11 DIAGNOSIS — E785 Hyperlipidemia, unspecified: Secondary | ICD-10-CM | POA: Insufficient documentation

## 2021-11-11 DIAGNOSIS — E10649 Type 1 diabetes mellitus with hypoglycemia without coma: Secondary | ICD-10-CM | POA: Insufficient documentation

## 2021-11-11 DIAGNOSIS — Z79899 Other long term (current) drug therapy: Secondary | ICD-10-CM | POA: Diagnosis not present

## 2021-11-11 MED ORDER — INSULIN ASPART 100 UNIT/ML IJ SOLN
4.0000 [IU] | Freq: Three times a day (TID) | INTRAMUSCULAR | 2 refills | Status: DC
  Filled 2021-11-11: qty 10, 84d supply, fill #0
  Filled 2022-02-13: qty 10, 28d supply, fill #0
  Filled 2022-05-30: qty 10, 28d supply, fill #1

## 2021-11-28 ENCOUNTER — Telehealth: Payer: Self-pay | Admitting: Pharmacist

## 2021-11-28 ENCOUNTER — Encounter: Payer: Self-pay | Admitting: Pharmacist

## 2021-11-28 ENCOUNTER — Ambulatory Visit: Payer: Medicare Other | Attending: Family Medicine | Admitting: Pharmacist

## 2021-11-28 DIAGNOSIS — E119 Type 2 diabetes mellitus without complications: Secondary | ICD-10-CM | POA: Insufficient documentation

## 2021-11-28 DIAGNOSIS — Z7984 Long term (current) use of oral hypoglycemic drugs: Secondary | ICD-10-CM | POA: Diagnosis not present

## 2021-11-28 DIAGNOSIS — E1065 Type 1 diabetes mellitus with hyperglycemia: Secondary | ICD-10-CM

## 2021-11-28 DIAGNOSIS — R351 Nocturia: Secondary | ICD-10-CM | POA: Insufficient documentation

## 2021-11-28 NOTE — Telephone Encounter (Signed)
Referral submitted to endocrinology per pt's request ?

## 2021-11-28 NOTE — Addendum Note (Signed)
Addended by: Karle Plumber B on: 11/28/2021 09:34 PM ? ? Modules accepted: Orders ? ?

## 2021-11-28 NOTE — Telephone Encounter (Signed)
Patient is a type 1 diabetic who is looking to establish care with one of our Endocrinologists at [facility]. He requests a referral. Will route to the provider covering for pt's PCP. ?

## 2021-11-28 NOTE — Progress Notes (Signed)
? ? ?  S: ?PCP: Dr. Margarita Rana ? ?Jared Murray is a 46 y.o. male who presents for diabetes evaluation, education, and management. PMH is significant for T1DM, DKA, HLD.  Patient was referred and last seen by Freeman Caldron, PA-C, on 10/10/21. Pharmacy last saw him on 11/11/2021. We had him schedule his Novolog and continue Basaglar at 15u daily.  ? ?Today, he arrives in good spirits and presents without assistance. Has done well since his last visit.  ? ?Family/Social History:  ?-HTN in father ?-Former smoker  ? ?DM diagnosed in 2014.  ? ?Current diabetes medications include: Basaglar 15 units daily, Novolog 4u TID before meals. ? ?Patient states that he is taking his medications as prescribed. Patient reports adherence with medications. Patient states that he does not miss doses of his medication.  ? ?Do you feel that your medications are working for you? yes ?Have you been experiencing any side effects to the medications prescribed? no ?Insurance coverage: Medicare + Medicaid ? ?Patient denies hypoglycemic events. ? ?Patient reports nocturia (nighttime urination). 1x/night.  ?Patient denies neuropathy (nerve pain). ?Patient reports visual changes. Went to the eye doctor recently and got a Rx for readers.  ?Patient reports self foot exams. Has a podiatrist.  ? ?Patient reported dietary habits: Reports eating carbs with his meat at meals. Tries to eat less of rice, pasta. Wife cooks. Reports dietary indiscretions.  ? ?Patient-reported exercise habits: limited by hip replacements, tries to walk often  ? ?O:  ?BG at home:  ?Stopped using his Dexcom.  ?Reports that his CBGs at home since his last pharmacy visit have improved. Stays between 180s-220s for most of the time.  ? ?Lab Results  ?Component Value Date  ? HGBA1C 13.5 (H) 10/10/2021  ? ?There were no vitals filed for this visit. ? ?Lipid Panel  ?   ?Component Value Date/Time  ? CHOL 115 12/18/2020 1104  ? TRIG 52 12/18/2020 1104  ? HDL 39 (L) 12/18/2020 1104  ?  CHOLHDL 2.9 12/18/2020 1104  ? CHOLHDL 2.3 12/19/2015 0947  ? VLDL 12 12/19/2015 0947  ? [place] 64 12/18/2020 1104  ? ? ?Clinical Atherosclerotic Cardiovascular Disease (ASCVD): No  ?The ASCVD Risk score (Arnett DK, et al., 2019) failed to calculate for the following reasons: ?  The valid total cholesterol range is 130 to 320 mg/dL  ? ? ?A/P: ?Diabetes longstanding currently uncontrolled with most recent A1c 13.5, however, his home glycemic control seems to be improving. Patient is able to verbalize appropriate hypoglycemia management plan. Medication adherence appears appropriate.  ?-Continue Basaglar 15 units daily.  ?-Continue Novolog 4 units TIDAC. Skip if BG <150. If BG >350 take 6 units instead of 4.  ?-Extensively discussed pathophysiology of diabetes, recommended lifestyle interventions, dietary effects on blood sugar control.  ?-Counseled on s/sx of and management of hypoglycemia.  ?-Next A1c anticipated May 2023. Will message PCP concerning Endo referral.  ? ?Written patient instructions provided. Patient verbalized understanding of treatment plan. Total time in face to face counseling 30 minutes.   ? ?Follow up PCP visit 12/19/2021. ? ?Benard Halsted, PharmD, BCACP, CPP ?Clinical Pharmacist ?[place] ?[phone] ? ?

## 2021-12-02 DIAGNOSIS — Z20822 Contact with and (suspected) exposure to covid-19: Secondary | ICD-10-CM | POA: Diagnosis not present

## 2021-12-03 NOTE — Telephone Encounter (Signed)
Pt requested a call back from [facility], please advise.  ?

## 2021-12-19 ENCOUNTER — Ambulatory Visit: Payer: Medicare Other | Attending: Family Medicine | Admitting: Family Medicine

## 2021-12-19 ENCOUNTER — Other Ambulatory Visit: Payer: Self-pay

## 2021-12-19 ENCOUNTER — Encounter: Payer: Self-pay | Admitting: Family Medicine

## 2021-12-19 VITALS — BP 124/80 | HR 93 | Ht 70.0 in | Wt 135.0 lb

## 2021-12-19 DIAGNOSIS — E1065 Type 1 diabetes mellitus with hyperglycemia: Secondary | ICD-10-CM | POA: Diagnosis not present

## 2021-12-19 DIAGNOSIS — M89752 Major osseous defect, left pelvic region and thigh: Secondary | ICD-10-CM | POA: Insufficient documentation

## 2021-12-19 DIAGNOSIS — N528 Other male erectile dysfunction: Secondary | ICD-10-CM

## 2021-12-19 DIAGNOSIS — Z794 Long term (current) use of insulin: Secondary | ICD-10-CM | POA: Diagnosis not present

## 2021-12-19 DIAGNOSIS — M879 Osteonecrosis, unspecified: Secondary | ICD-10-CM | POA: Diagnosis not present

## 2021-12-19 DIAGNOSIS — N529 Male erectile dysfunction, unspecified: Secondary | ICD-10-CM | POA: Diagnosis not present

## 2021-12-19 DIAGNOSIS — Z91148 Patient's other noncompliance with medication regimen for other reason: Secondary | ICD-10-CM | POA: Insufficient documentation

## 2021-12-19 DIAGNOSIS — E785 Hyperlipidemia, unspecified: Secondary | ICD-10-CM | POA: Diagnosis not present

## 2021-12-19 DIAGNOSIS — L409 Psoriasis, unspecified: Secondary | ICD-10-CM | POA: Insufficient documentation

## 2021-12-19 DIAGNOSIS — K219 Gastro-esophageal reflux disease without esophagitis: Secondary | ICD-10-CM | POA: Diagnosis not present

## 2021-12-19 DIAGNOSIS — M89751 Major osseous defect, right pelvic region and thigh: Secondary | ICD-10-CM | POA: Insufficient documentation

## 2021-12-19 DIAGNOSIS — L80 Vitiligo: Secondary | ICD-10-CM | POA: Insufficient documentation

## 2021-12-19 DIAGNOSIS — Z79899 Other long term (current) drug therapy: Secondary | ICD-10-CM | POA: Diagnosis not present

## 2021-12-19 LAB — POCT GLYCOSYLATED HEMOGLOBIN (HGB A1C): HbA1c, POC (controlled diabetic range): 11 % — AB (ref 0.0–7.0)

## 2021-12-19 LAB — GLUCOSE, POCT (MANUAL RESULT ENTRY): POC Glucose: 67 mg/dl — AB (ref 70–99)

## 2021-12-19 MED ORDER — SILDENAFIL CITRATE 50 MG PO TABS
50.0000 mg | ORAL_TABLET | Freq: Every day | ORAL | 1 refills | Status: AC | PRN
  Filled 2021-12-19: qty 10, 10d supply, fill #0

## 2021-12-19 NOTE — Progress Notes (Signed)
? ?Subjective:  ?Patient ID: Jared Murray, male    DOB: Feb 14, 1976  Age: 46 y.o. MRN: [id number] ? ?CC: Diabetes ? ? ?HPI ?Jared Murray is a 46 y.o. year old male with a history of type 1 diabetes mellitus (A1c 11.0 from [facility]), avascular necrosis of both hips, GERD, and Vitiligo, psoriasis who presents today for a follow up visit.  ?He is accompanied by his wife to today's visit. ? ?Interval History: ? ?His diabetes was previously managed by [facility] as he had requested a referral there but he has been noncompliant with his appointment with his last visit in 01/2021.  He is now interested in being seen by local endocrinologist. ?His blood sugars fluctuate as he has had instances where his sugars are over 250 and he receives an alarm on his Dexcom about 4 times a day but then also states his sugars drop to the 70s.  His wife states she has had to wake him up from sleep sometimes because he is sweating and has had to give him juice. ?He is not adherent with a diabetic diet as his wife works and he eats processed foods when she is not home. ? ?Has not been taking his Statin for the last couple of months ?Requests a refill of his PPI. ? ?He would like a Prescription for Viagra sent to his pharmacy. ? ?Past Medical History:  ?Diagnosis Date  ? Acute pericarditis 03/31/2016  ? AKI (acute kidney injury) ([place]) 01/22/2015  ? Allergy   ? Anxiety   ? per pt  ? Arthritis   ? Avascular necrosis ([place]) 02/12/2015  ? Avascular necrosis of bone of hip ([place])   ? Chest wall abscess 04/03/2015  ? Chronic diarrhea of unknown origin 09/10/2017  ? Cutaneous abscess of chest wall   ? Depression   ? per pt  ? DKA (diabetic ketoacidoses)   ? DKA, type 1, not at goal [facility]) 04/03/2015  ? Eczema   ? Elevated liver enzymes 11/27/2018  ? GERD (gastroesophageal reflux disease)   ? HLD (hyperlipidemia) 11/25/2018  ? no meds  ? Hyperbilirubinemia   ? Hyperglycemia due to type 1 diabetes mellitus ([place])   ? Hyperkalemia   ? Hypokalemia 03/17/2013   ? Left hip pain 04/18/2014  ? Loss of weight 03/17/2013  ? Marijuana use 11/25/2018  ? Osteoarthritis 12/24/2016  ? Prolonged QT interval 11/27/2018  ? in setting of RBBB and hypokalemia  ? Protein-calorie malnutrition, severe ([place]) 03/17/2013  ? Psoriasis   ? RBBB 12/23/2018  ? Chronic since at least 2014  ? Sepsis ([place]) 01/22/2015  ? Substance abuse ([place])   ? Tobacco use disorder 09/13/2015  ? Type 1 diabetes mellitus ([place])   ? ? ?Past Surgical History:  ?Procedure Laterality Date  ? COLONOSCOPY  2020  ? COLONOSCOPY WITH PROPOFOL N/A 05/07/2020  ? Procedure: COLONOSCOPY WITH PROPOFOL;  Surgeon: Mansouraty, Telford Nab., MD;  Location: [place];  Service: Gastroenterology;  Laterality: N/A;  ? ENDOSCOPIC MUCOSAL RESECTION N/A 05/07/2020  ? Procedure: ENDOSCOPIC MUCOSAL RESECTION;  Surgeon: Rush Landmark Telford Nab., MD;  Location: [place];  Service: Gastroenterology;  Laterality: N/A;  ? FINGER SURGERY Right   ? middle finger  ? SUBMUCOSAL LIFTING INJECTION  05/07/2020  ? Procedure: SUBMUCOSAL LIFTING INJECTION;  Surgeon: Irving Copas., MD;  Location: [place];  Service: Gastroenterology;;  ? ? ?Family History  ?Problem Relation Age of Onset  ? Hypertension Father   ? Diabetes Other   ?  multiple  ? Lupus Cousin   ? Stroke Maternal Grandmother   ? Stroke Paternal Grandmother   ? Colon cancer Neg Hx   ? Stomach cancer Neg Hx   ? Rectal cancer Neg Hx   ? Esophageal cancer Neg Hx   ? Colon polyps Neg Hx   ? ? ?Social History  ? ?Socioeconomic History  ? Marital status: Married  ?  Spouse name: Not on file  ? Number of children: Not on file  ? Years of education: Not on file  ? Highest education level: Not on file  ?Occupational History  ? Not on file  ?Tobacco Use  ? Smoking status: Former  ?  Packs/day: 0.25  ?  Years: 10.00  ?  Pack years: 2.50  ?  Types: Cigarettes  ?  Quit date: 11/26/2018  ?  Years since quitting: 3.0  ? Smokeless tobacco: Never  ?Vaping Use  ? Vaping Use: Never used  ?Substance and Sexual  Activity  ? Alcohol use: Not Currently  ?  Alcohol/week: 1.0 - 2.0 standard drink  ?  Types: 1 - 2 Shots of liquor per week  ?  Comment: none since 2020  ? Drug use: Yes  ?  Types: Marijuana  ? Sexual activity: Yes  ?Other Topics Concern  ? Not on file  ?Social History Narrative  ? Lives in [place] - works as a Education officer, museum  ? Married  ? ?Social Determinants of Health  ? ?Financial Resource Strain: Not on file  ?Food Insecurity: Not on file  ?Transportation Needs: Not on file  ?Physical Activity: Not on file  ?Stress: Not on file  ?Social Connections: Not on file  ? ? ?Allergies  ?Allergen Reactions  ? Bee Venom Swelling  ? Latex Itching and Rash  ? Penicillins Other (See Comments)  ?  Childhood allergy ?Has patient had a PCN reaction causing immediate rash, facial/tongue/throat swelling, SOB or lightheadedness with hypotension: NO ?Has patient had a PCN reaction causing severe rash involving mucus membranes or skin necrosis:NO ?Has patient had a PCN reaction that required hospitalization NO ?Has patient had a PCN reaction occurring within the last 10 years: NO ?If all of the above answers are "NO", then may proceed with Cephalosporin use.  ? ? ?Outpatient Medications Prior to Visit  ?Medication Sig Dispense Refill  ? Accu-Chek Softclix Lancets lancets Use as instructed 3 times daily. Dx: Type 1 DM 120 each 5  ? aspirin-sod bicarb-citric acid (ALKA-SELTZER) 325 MG TBEF tablet Take 325 mg by mouth every 6 (six) hours as needed (heartburn).    ? atorvastatin (LIPITOR) 20 MG tablet TAKE 1 TABLET (20 MG TOTAL) BY MOUTH DAILY. 30 tablet 5  ? Blood Glucose Monitoring Suppl (ACCU-CHEK AVIVA PLUS) w/Device KIT TEST 3 TIMES DAILY 1 kit 0  ? clobetasol cream (TEMOVATE) 3.82 % APPLY 1 APPLICATION TOPICALLY 2 (TWO) TIMES DAILY AS NEEDED (RASH). 30 g 0  ? glucose blood (ACCU-CHEK AVIVA PLUS) test strip USE TO CHECK BLOOD SUGAR FOUR TIMES DAILY BEFORE MEALS AND AT BEDTIME 120 each 11  ? insulin aspart (NOVOLOG) 100 UNIT/ML  injection Inject 4 Units into the skin 3 (three) times daily with meals. If blood sugar before meals is above 350, use 6 units. 10 mL 2  ? Insulin Glargine (BASAGLAR KWIKPEN) 100 UNIT/ML Inject 18 Units into the skin at bedtime. 15 mL 3  ? INSULIN SYRINGE 1CC/29G 29G X 1/2" 1 ML MISC use as directed 100 each 2  ? mupirocin ointment (  BACTROBAN) 2 % Apply 1 application topically 2 (two) times daily to open area on the scalp. 22 g 0  ? pantoprazole (PROTONIX) 40 MG tablet TAKE 1 TABLET (40 MG TOTAL) BY MOUTH DAILY. 30 tablet 3  ? Tetrahydrozoline HCl (VISINE OP) Place 1 drop into both eyes daily as needed (dryness / allergies).    ? dicyclomine (BENTYL) 10 MG capsule Take 1 capsule (10 mg total) by mouth 2 (two) times daily as needed for spasms. 60 capsule 6  ? ondansetron (ZOFRAN-ODT) 4 MG disintegrating tablet Take 1 tablet (4 mg total) by mouth every 8 (eight) hours as needed for nausea or vomiting. 20 tablet 0  ? sildenafil (VIAGRA) 50 MG tablet Take 1 tablet (50 mg total) by mouth daily as needed for erectile dysfunction (At least 24 hours between doses). 10 tablet 1  ? ?No facility-administered medications prior to visit.  ? ? ? ?ROS ?Review of Systems  ?Constitutional:  Negative for activity change and appetite change.  ?HENT:  Negative for sinus pressure and sore throat.   ?Eyes:  Negative for visual disturbance.  ?Respiratory:  Negative for cough, chest tightness and shortness of breath.   ?Cardiovascular:  Negative for chest pain and leg swelling.  ?Gastrointestinal:  Negative for abdominal distention, abdominal pain, constipation and diarrhea.  ?Endocrine: Negative.   ?Genitourinary:  Negative for dysuria.  ?Musculoskeletal:  Negative for joint swelling and myalgias.  ?Skin:  Negative for rash.  ?Allergic/Immunologic: Negative.   ?Neurological:  Negative for weakness, light-headedness and numbness.  ?Psychiatric/Behavioral:  Negative for dysphoric mood and suicidal ideas.   ? ?Objective:  ?BP 124/80   Pulse  93   Ht _0  (1.778 m)   Wt 135 lb (61.2 kg)   SpO2 100%   BMI 19.37 kg/m?  ? ? ?  12/19/2021  ? 11:34 AM 10/10/2021  ?  2:58 PM 09/03/2021  ?  6:33 PM  ?BP/Weight  ?Systolic BP [phone]  ?Diastolic BP 8

## 2021-12-19 NOTE — Patient Instructions (Signed)
Hypoglycemia Hypoglycemia occurs when the level of sugar (glucose) in the blood is too low. Hypoglycemia can happen in people who have or do not have diabetes. It can develop quickly, and it can be a medical emergency. For most people, a blood glucose level below 70 mg/dL (3.9 mmol/L) is considered hypoglycemia. Glucose is a type of sugar that provides the body's main source of energy. Certain hormones (insulin and glucagon) control the level of glucose in the blood. Insulin lowers blood glucose, and glucagon raises blood glucose. Hypoglycemia can result from having too much insulin in the bloodstream, or from not eating enough food that contains glucose. You may also have reactive hypoglycemia, which happens within 4 hours after eating a meal. What are the causes? Hypoglycemia occurs most often in people who have diabetes and may be caused by: Diabetes medicine. Not eating enough, or not eating often enough. Increased physical activity. Drinking alcohol on an empty stomach. If you do not have diabetes, hypoglycemia may be caused by: A tumor in the pancreas. Not eating enough, or not eating for long periods at a time (fasting). A severe infection or illness. Problems after having bariatric surgery. Organ failure, such as kidney or liver failure. Certain medicines. What increases the risk? Hypoglycemia is more likely to develop in people who: Have diabetes and take medicines to lower blood glucose. Abuse alcohol. Have a severe illness. What are the signs or symptoms? Symptoms vary depending on whether the condition is mild, moderate, or severe. Mild hypoglycemia Hunger. Sweating and feeling clammy. Dizziness or feeling light-headed. Sleepiness or restless sleep. Nausea. Increased heart rate. Headache. Blurry vision. Mood changes, such as irritability or anxiety. Tingling or numbness around the mouth, lips, or tongue. Moderate hypoglycemia Confusion and poor judgment. Behavior  changes. Weakness. Irregular heartbeat. A change in coordination. Severe hypoglycemia Severe hypoglycemia is a medical emergency. It can cause: Fainting. Seizures. Loss of consciousness (coma). Death. How is this diagnosed? Hypoglycemia is diagnosed with a blood test to measure your blood glucose level. This blood test is done while you are having symptoms. Your health care provider may also do a physical exam and review your medical history. How is this treated? This condition can be treated by immediately eating or drinking something that contains sugar with 15 grams of fast-acting carbohydrate, such as: 4 oz (120 mL) of fruit juice. 4 oz (120 mL) of regular soda (not diet soda). Several pieces of hard candy. Check food labels to find out how many pieces to eat for 15 grams. 1 Tbsp (15 mL) of sugar or honey. 4 glucose tablets. 1 tube of glucose gel. Treating hypoglycemia if you have diabetes If you are alert and able to swallow safely, follow the 15:15 rule: Take 15 grams of a fast-acting carbohydrate. Talk with your health care provider about how much you should take. Options for getting 15 grams of fast-acting carbohydrate include: Glucose tablets (take 4 tablets). Several pieces of hard candy. Check food labels to find out how many pieces to eat for 15 grams. 4 oz (120 mL) of fruit juice. 4 oz (120 mL) of regular soda (not diet soda). 1 Tbsp (15 mL) of sugar or honey. 1 tube of glucose gel. Check your blood glucose 15 minutes after you take the carbohydrate. If the repeat blood glucose level is still at or below 70 mg/dL (3.9 mmol/L), take 15 grams of a carbohydrate again. If your blood glucose level does not increase above 70 mg/dL (3.9 mmol/L) after 3 tries, seek emergency   medical care. After your blood glucose level returns to normal, eat a meal or a snack within 1 hour.  Treating severe hypoglycemia Severe hypoglycemia is when your blood glucose level is below 54 mg/dL (3  mmol/L). Severe hypoglycemia is a medical emergency. Get medical help right away. If you have severe hypoglycemia and you cannot eat or drink, you will need to be given glucagon. A family member or close friend should learn how to check your blood glucose and how to give you glucagon. Ask your health care provider if you need to have an emergency glucagon kit available. Severe hypoglycemia may need to be treated in a hospital. The treatment may include getting glucose through an IV. You may also need treatment for the cause of your hypoglycemia. Follow these instructions at home:  General instructions Take over-the-counter and prescription medicines only as told by your health care provider. Monitor your blood glucose as told by your health care provider. If you drink alcohol: Limit how much you have to: 0-1 drink a day for women who are not pregnant. 0-2 drinks a day for men. Know how much alcohol is in your drink. In the [place] one drink equals one 12 oz bottle of beer (355 mL), one 5 oz glass of wine (148 mL), or one 1 oz glass of hard liquor (44 mL). Be sure to eat food along with drinking alcohol. Be aware that alcohol is absorbed quickly and may have lingering effects that may result in hypoglycemia later. Be sure to do ongoing glucose monitoring. Keep all follow-up visits. This is important. If you have diabetes: Always have a fast-acting carbohydrate (15 grams) option with you to treat low blood glucose. Follow your diabetes management plan as directed by your health care provider. Make sure you: Know the symptoms of hypoglycemia. It is important to treat it right away to prevent it from becoming severe. Check your blood glucose as often as told. Always check before and after exercise. Always check your blood glucose before you drive a motorized vehicle. Take your medicines as told. Follow your meal plan. Eat on time, and do not skip meals. Share your diabetes management plan with  people in your workplace, school, and household. Carry a medical alert card or wear medical alert jewelry. Where to find more information American Diabetes Association: www.diabetes.org Contact a health care provider if: You have problems keeping your blood glucose in your target range. You have frequent episodes of hypoglycemia. Get help right away if: You continue to have hypoglycemia symptoms after eating or drinking something that contains 15 grams of fast-acting carbohydrate, and you cannot get your blood glucose above 70 mg/dL (3.9 mmol/L) while following the 15:15 rule. Your blood glucose is below 54 mg/dL (3 mmol/L). You have a seizure. You faint. These symptoms may represent a serious problem that is an emergency. Do not wait to see if the symptoms will go away. Get medical help right away. Call your local emergency services (911 in the [place] Do not drive yourself to the hospital. Summary Hypoglycemia occurs when the level of sugar (glucose) in the blood is too low. Hypoglycemia can happen in people who have or do not have diabetes. It can develop quickly, and it can be a medical emergency. Make sure you know the symptoms of hypoglycemia and how to treat it. Always have a fast-acting carbohydrate option with you to treat low blood sugar. This information is not intended to replace advice given to you by your health care provider.   Make sure you discuss any questions you have with your health care provider. Document Revised: 07/05/2020 Document Reviewed: 07/05/2020 Elsevier Patient Education  2023 Elsevier Inc.  

## 2021-12-20 LAB — LP+NON-HDL CHOLESTEROL
Cholesterol, Total: 107 mg/dL (ref 100–199)
HDL: 38 mg/dL — ABNORMAL LOW (ref >39)
LDL Chol Calc (NIH): 59 mg/dL (ref 0–99)
Total Non-HDL-Chol (LDL+VLDL): 69 mg/dL (ref 0–129)
Triglycerides: 37 mg/dL (ref 0–149)
VLDL Cholesterol Cal: 10 mg/dL (ref 5–40)

## 2021-12-21 LAB — MICROALBUMIN / CREATININE URINE RATIO
Creatinine, Urine: 407.4 mg/dL
Microalb/Creat Ratio: 16 mg/g creat (ref 0–29)
Microalbumin, Urine: 66.4 ug/mL

## 2021-12-26 ENCOUNTER — Other Ambulatory Visit: Payer: Self-pay

## 2021-12-31 ENCOUNTER — Ambulatory Visit: Payer: Medicare Other | Admitting: Podiatry

## 2022-01-03 ENCOUNTER — Emergency Department (HOSPITAL_COMMUNITY): Payer: Medicare Other

## 2022-01-03 ENCOUNTER — Encounter (HOSPITAL_COMMUNITY): Payer: Self-pay | Admitting: Emergency Medicine

## 2022-01-03 ENCOUNTER — Inpatient Hospital Stay (HOSPITAL_COMMUNITY)
Admission: EM | Admit: 2022-01-03 | Discharge: 2022-01-09 | DRG: 982 | Disposition: A | Discharge status: Home / Self Care | Payer: Medicare Other | Attending: Internal Medicine | Admitting: Internal Medicine

## 2022-01-03 DIAGNOSIS — M79643 Pain in unspecified hand: Secondary | ICD-10-CM | POA: Diagnosis not present

## 2022-01-03 DIAGNOSIS — Z681 Body mass index (BMI) 19 or less, adult: Secondary | ICD-10-CM | POA: Diagnosis not present

## 2022-01-03 DIAGNOSIS — R824 Acetonuria: Secondary | ICD-10-CM | POA: Diagnosis present

## 2022-01-03 DIAGNOSIS — M1990 Unspecified osteoarthritis, unspecified site: Secondary | ICD-10-CM | POA: Diagnosis present

## 2022-01-03 DIAGNOSIS — R81 Glycosuria: Secondary | ICD-10-CM | POA: Diagnosis present

## 2022-01-03 DIAGNOSIS — Z823 Family history of stroke: Secondary | ICD-10-CM

## 2022-01-03 DIAGNOSIS — K219 Gastro-esophageal reflux disease without esophagitis: Secondary | ICD-10-CM | POA: Diagnosis present

## 2022-01-03 DIAGNOSIS — E1065 Type 1 diabetes mellitus with hyperglycemia: Secondary | ICD-10-CM | POA: Diagnosis not present

## 2022-01-03 DIAGNOSIS — B951 Streptococcus, group B, as the cause of diseases classified elsewhere: Secondary | ICD-10-CM | POA: Diagnosis present

## 2022-01-03 DIAGNOSIS — R809 Proteinuria, unspecified: Secondary | ICD-10-CM | POA: Diagnosis present

## 2022-01-03 DIAGNOSIS — Z1152 Encounter for screening for COVID-19: Secondary | ICD-10-CM

## 2022-01-03 DIAGNOSIS — E440 Moderate protein-calorie malnutrition: Secondary | ICD-10-CM | POA: Diagnosis present

## 2022-01-03 DIAGNOSIS — Z91040 Latex allergy status: Secondary | ICD-10-CM

## 2022-01-03 DIAGNOSIS — Z87891 Personal history of nicotine dependence: Secondary | ICD-10-CM | POA: Diagnosis not present

## 2022-01-03 DIAGNOSIS — E10628 Type 1 diabetes mellitus with other skin complications: Principal | ICD-10-CM | POA: Diagnosis present

## 2022-01-03 DIAGNOSIS — I10 Essential (primary) hypertension: Secondary | ICD-10-CM | POA: Diagnosis not present

## 2022-01-03 DIAGNOSIS — D649 Anemia, unspecified: Secondary | ICD-10-CM | POA: Diagnosis present

## 2022-01-03 DIAGNOSIS — L03114 Cellulitis of left upper limb: Secondary | ICD-10-CM | POA: Diagnosis not present

## 2022-01-03 DIAGNOSIS — E876 Hypokalemia: Secondary | ICD-10-CM | POA: Diagnosis present

## 2022-01-03 DIAGNOSIS — Z8249 Family history of ischemic heart disease and other diseases of the circulatory system: Secondary | ICD-10-CM | POA: Diagnosis not present

## 2022-01-03 DIAGNOSIS — Z794 Long term (current) use of insulin: Secondary | ICD-10-CM | POA: Diagnosis not present

## 2022-01-03 DIAGNOSIS — R9431 Abnormal electrocardiogram [ECG] [EKG]: Secondary | ICD-10-CM | POA: Diagnosis not present

## 2022-01-03 DIAGNOSIS — M7989 Other specified soft tissue disorders: Secondary | ICD-10-CM | POA: Diagnosis not present

## 2022-01-03 DIAGNOSIS — L309 Dermatitis, unspecified: Secondary | ICD-10-CM | POA: Diagnosis present

## 2022-01-03 DIAGNOSIS — R509 Fever, unspecified: Secondary | ICD-10-CM | POA: Diagnosis not present

## 2022-01-03 DIAGNOSIS — Z880 Allergy status to penicillin: Secondary | ICD-10-CM | POA: Diagnosis not present

## 2022-01-03 DIAGNOSIS — Z833 Family history of diabetes mellitus: Secondary | ICD-10-CM | POA: Diagnosis not present

## 2022-01-03 DIAGNOSIS — L02512 Cutaneous abscess of left hand: Secondary | ICD-10-CM | POA: Diagnosis not present

## 2022-01-03 DIAGNOSIS — E785 Hyperlipidemia, unspecified: Secondary | ICD-10-CM | POA: Diagnosis present

## 2022-01-03 DIAGNOSIS — E7800 Pure hypercholesterolemia, unspecified: Secondary | ICD-10-CM | POA: Diagnosis not present

## 2022-01-03 DIAGNOSIS — Z79899 Other long term (current) drug therapy: Secondary | ICD-10-CM

## 2022-01-03 LAB — CBC
HCT: 33 % — ABNORMAL LOW (ref 39.0–52.0)
Hemoglobin: 11.1 g/dL — ABNORMAL LOW (ref 13.0–17.0)
MCH: 28.4 pg (ref 26.0–34.0)
MCHC: 33.6 g/dL (ref 30.0–36.0)
MCV: 84.4 fL (ref 80.0–100.0)
Platelets: 322 10*3/uL (ref 150–400)
RBC: 3.91 MIL/uL — ABNORMAL LOW (ref 4.22–5.81)
RDW: 13.4 % (ref 11.5–15.5)
WBC: 6 10*3/uL (ref 4.0–10.5)
nRBC: 0 % (ref 0.0–0.2)

## 2022-01-03 LAB — BASIC METABOLIC PANEL
Anion gap: 10 (ref 5–15)
BUN: 13 mg/dL (ref 6–20)
CO2: 24 mmol/L (ref 22–32)
Calcium: 8.7 mg/dL — ABNORMAL LOW (ref 8.9–10.3)
Chloride: 100 mmol/L (ref 98–111)
Creatinine, Ser: 1.15 mg/dL (ref 0.61–1.24)
GFR, Estimated: >60 mL/min (ref >60)
Glucose, Bld: 181 mg/dL — ABNORMAL HIGH (ref 70–99)
Potassium: 3.6 mmol/L (ref 3.5–5.1)
Sodium: 134 mmol/L — ABNORMAL LOW (ref 135–145)

## 2022-01-03 LAB — URIC ACID: Uric Acid, Serum: 5.9 mg/dL (ref 3.7–8.6)

## 2022-01-03 LAB — RESP PANEL BY RT-PCR (FLU A&B, COVID) ARPGX2
Influenza A by PCR: NEGATIVE
Influenza B by PCR: NEGATIVE
SARS Coronavirus 2 by RT PCR: NEGATIVE

## 2022-01-03 LAB — GROUP A STREP BY PCR: Group A Strep by PCR: NOT DETECTED

## 2022-01-03 MED ORDER — SODIUM CHLORIDE 0.9 % IV BOLUS
1000.0000 mL | Freq: Once | INTRAVENOUS | Status: AC
  Administered 2022-01-03: 1000 mL via INTRAVENOUS

## 2022-01-03 NOTE — ED Triage Notes (Signed)
Pt reports body aches, chills, headache since Monday. Family in house have tested positive for strep. Denies any real changes and symptoms coming and going. VSS. No fever. Wife called EMS. Pt is diabetic. CBG 231 and has not had nighttime insulin with dinner.

## 2022-01-03 NOTE — ED Provider Triage Note (Addendum)
Emergency Medicine Provider Triage Evaluation Note  Jared Murray , a 46 y.o. male  was evaluated in triage.  Pt complains of generalized body aches and chills for the last 1 week.  Patient reports that he has family and was at home sick with strep throat.  Patient states that he has not been tolerating oral intake recently, has a decreased appetite as result.  Patient is hypotensive in triage, complaining of lightheadedness and dizziness.  Patient denies any chest pain, shortness of breath, abdominal pain, diarrhea. Patient also has noticeably swollen left hand. Denies trauma, denies history of gout. Hand warm to touch.   Review of Systems  Positive:  Negative:   Physical Exam  BP (!) 76/59 (BP Location: Right Arm)   Pulse (!) 110   Temp 98.8 F (37.1 C) (Oral)   Resp 18   Ht '5\' 10"'$  (1.778 m)   Wt 61.2 kg   SpO2 100%   BMI 19.37 kg/m  Gen:   Awake, no distress   Resp:  Normal effort  MSK:   Moves extremities without difficulty  Other:    Medical Decision Making  Medically screening exam initiated at 10:03 PM.  Appropriate orders placed.  Jared Murray was informed that the remainder of the evaluation will be completed by another provider, this initial triage assessment does not replace that evaluation, and the importance of remaining in the ED until their evaluation is complete.         Azucena Cecil, PA-C 01/03/22 2213

## 2022-01-04 ENCOUNTER — Other Ambulatory Visit: Payer: Self-pay

## 2022-01-04 ENCOUNTER — Encounter (HOSPITAL_COMMUNITY): Payer: Self-pay | Admitting: Internal Medicine

## 2022-01-04 ENCOUNTER — Emergency Department (HOSPITAL_COMMUNITY): Payer: Medicare Other

## 2022-01-04 DIAGNOSIS — L03114 Cellulitis of left upper limb: Secondary | ICD-10-CM | POA: Diagnosis not present

## 2022-01-04 DIAGNOSIS — D649 Anemia, unspecified: Secondary | ICD-10-CM | POA: Diagnosis present

## 2022-01-04 DIAGNOSIS — R509 Fever, unspecified: Secondary | ICD-10-CM | POA: Diagnosis not present

## 2022-01-04 LAB — COMPREHENSIVE METABOLIC PANEL
ALT: 24 U/L (ref 0–44)
AST: 25 U/L (ref 15–41)
Albumin: 3.3 g/dL — ABNORMAL LOW (ref 3.5–5.0)
Alkaline Phosphatase: 119 U/L (ref 38–126)
Anion gap: 10 (ref 5–15)
BUN: 12 mg/dL (ref 6–20)
CO2: 24 mmol/L (ref 22–32)
Calcium: 8.5 mg/dL — ABNORMAL LOW (ref 8.9–10.3)
Chloride: 102 mmol/L (ref 98–111)
Creatinine, Ser: 1.1 mg/dL (ref 0.61–1.24)
GFR, Estimated: >60 mL/min (ref >60)
Glucose, Bld: 148 mg/dL — ABNORMAL HIGH (ref 70–99)
Potassium: 3.5 mmol/L (ref 3.5–5.1)
Sodium: 136 mmol/L (ref 135–145)
Total Bilirubin: 1.2 mg/dL (ref 0.3–1.2)
Total Protein: 7 g/dL (ref 6.5–8.1)

## 2022-01-04 LAB — URINALYSIS, ROUTINE W REFLEX MICROSCOPIC
Bacteria, UA: NONE SEEN
Bilirubin Urine: NEGATIVE
Glucose, UA: 50 mg/dL — AB
Hgb urine dipstick: NEGATIVE
Ketones, ur: 20 mg/dL — AB
Leukocytes,Ua: NEGATIVE
Nitrite: NEGATIVE
Protein, ur: 100 mg/dL — AB
Specific Gravity, Urine: 1.029 (ref 1.005–1.030)
pH: 5 (ref 5.0–8.0)

## 2022-01-04 LAB — PROTIME-INR
INR: 1 (ref 0.8–1.2)
Prothrombin Time: 13.2 seconds (ref 11.4–15.2)

## 2022-01-04 LAB — LACTIC ACID, PLASMA
Lactic Acid, Venous: 1.4 mmol/L (ref 0.5–1.9)
Lactic Acid, Venous: 0.9 mmol/L (ref 0.5–1.9)

## 2022-01-04 LAB — APTT: aPTT: 31 seconds (ref 24–36)

## 2022-01-04 LAB — GLUCOSE, CAPILLARY
Glucose-Capillary: 85 mg/dL (ref 70–99)
Glucose-Capillary: 123 mg/dL — ABNORMAL HIGH (ref 70–99)
Glucose-Capillary: 108 mg/dL — ABNORMAL HIGH (ref 70–99)
Glucose-Capillary: 97 mg/dL (ref 70–99)

## 2022-01-04 MED ORDER — PANTOPRAZOLE SODIUM 40 MG PO TBEC
40.0000 mg | DELAYED_RELEASE_TABLET | Freq: Every day | ORAL | Status: DC
  Administered 2022-01-04 – 2022-01-09 (×5): 40 mg via ORAL
  Filled 2022-01-04 (×5): qty 1

## 2022-01-04 MED ORDER — POTASSIUM CHLORIDE CRYS ER 20 MEQ PO TBCR
40.0000 meq | EXTENDED_RELEASE_TABLET | Freq: Once | ORAL | Status: AC
  Administered 2022-01-04: 40 meq via ORAL
  Filled 2022-01-04: qty 2

## 2022-01-04 MED ORDER — SODIUM CHLORIDE 0.9 % IV SOLN
1.0000 g | INTRAVENOUS | Status: DC
  Administered 2022-01-05 – 2022-01-07 (×3): 1 g via INTRAVENOUS
  Filled 2022-01-04 (×3): qty 10

## 2022-01-04 MED ORDER — PROCHLORPERAZINE EDISYLATE 10 MG/2ML IJ SOLN: 5.0000 mg | INTRAMUSCULAR | Status: DC | PRN

## 2022-01-04 MED ORDER — SODIUM CHLORIDE 0.45 % IV SOLN
INTRAVENOUS | Status: DC
Start: 2022-01-04 — End: 2022-01-09

## 2022-01-04 MED ORDER — INSULIN GLARGINE-YFGN 100 UNIT/ML ~~LOC~~ SOLN
15.0000 [IU] | Freq: Every day | SUBCUTANEOUS | Status: DC
  Administered 2022-01-04 – 2022-01-08 (×4): 15 [IU] via SUBCUTANEOUS
  Filled 2022-01-04 (×6): qty 0.15

## 2022-01-04 MED ORDER — INSULIN ASPART 100 UNIT/ML IJ SOLN
0.0000 [IU] | Freq: Three times a day (TID) | INTRAMUSCULAR | Status: DC
  Administered 2022-01-04: 1 [IU] via SUBCUTANEOUS
  Administered 2022-01-05 – 2022-01-06 (×2): 2 [IU] via SUBCUTANEOUS
  Administered 2022-01-06: 1 [IU] via SUBCUTANEOUS
  Administered 2022-01-07 (×2): 9 [IU] via SUBCUTANEOUS
  Administered 2022-01-08: 5 [IU] via SUBCUTANEOUS
  Administered 2022-01-08: 9 [IU] via SUBCUTANEOUS
  Administered 2022-01-08: 3 [IU] via SUBCUTANEOUS
  Administered 2022-01-09: 2 [IU] via SUBCUTANEOUS
  Administered 2022-01-09: 5 [IU] via SUBCUTANEOUS

## 2022-01-04 MED ORDER — OXYCODONE HCL 5 MG PO TABS
5.0000 mg | ORAL_TABLET | Freq: Four times a day (QID) | ORAL | Status: DC | PRN
  Administered 2022-01-04 – 2022-01-05 (×2): 5 mg via ORAL
  Filled 2022-01-04 (×2): qty 1

## 2022-01-04 MED ORDER — ACETAMINOPHEN 325 MG PO TABS: 650.0000 mg | ORAL_TABLET | Freq: Four times a day (QID) | ORAL | Status: DC | PRN

## 2022-01-04 MED ORDER — BASAGLAR KWIKPEN 100 UNIT/ML ~~LOC~~ SOPN: 18.0000 [IU] | PEN_INJECTOR | Freq: Every day | SUBCUTANEOUS | Status: DC

## 2022-01-04 MED ORDER — BASAGLAR KWIKPEN 100 UNIT/ML ~~LOC~~ SOPN: 15.0000 [IU] | PEN_INJECTOR | Freq: Every day | SUBCUTANEOUS | Status: DC

## 2022-01-04 MED ORDER — ATORVASTATIN CALCIUM 20 MG PO TABS
20.0000 mg | ORAL_TABLET | Freq: Every day | ORAL | Status: DC
  Administered 2022-01-04 – 2022-01-09 (×5): 20 mg via ORAL
  Filled 2022-01-04 (×2): qty 2
  Filled 2022-01-04 (×2): qty 1
  Filled 2022-01-04: qty 2

## 2022-01-04 MED ORDER — ACETAMINOPHEN 650 MG RE SUPP: 650.0000 mg | Freq: Four times a day (QID) | RECTAL | Status: DC | PRN

## 2022-01-04 MED ORDER — ACETAMINOPHEN 325 MG PO TABS
650.0000 mg | ORAL_TABLET | Freq: Once | ORAL | Status: AC
  Administered 2022-01-04: 650 mg via ORAL
  Filled 2022-01-04: qty 2

## 2022-01-04 MED ORDER — LACTATED RINGERS IV BOLUS
1000.0000 mL | Freq: Once | INTRAVENOUS | Status: AC
  Administered 2022-01-04: 1000 mL via INTRAVENOUS

## 2022-01-04 MED ORDER — SODIUM CHLORIDE 0.9 % IV SOLN
1.0000 g | Freq: Once | INTRAVENOUS | Status: AC
  Administered 2022-01-04: 1 g via INTRAVENOUS
  Filled 2022-01-04: qty 10

## 2022-01-04 NOTE — Progress Notes (Signed)
Handbook given to patient.  

## 2022-01-04 NOTE — H&P (Signed)
History and Physical    Patient: Jared Murray HKF:[id number] DOB: 06-26-1976 DOA: 01/03/2022 DOS: the patient was seen and examined on 01/04/2022 PCP: Charlott Rakes, MD   Patient coming from: Home  Chief Complaint:  Chief Complaint  Patient presents with   Generalized Body Aches        HPI: Jared Murray is a 46 y.o. male with medical history significant of history of pericarditis, history of AKI, seasonal allergies, anxiety, depression and vascular necrosis of the hip, chest wall abscess, chronic diarrhea, type I DKA episodes, eczema, history of elevated liver enzymes, GERD hyperlipidemia, hyperbilirubinemia, hyperkalemia, hypokalemia, history of cannabis use, osteoarthritis, history of prolonged QT interval, history of sepsis, substance abuse, tobacco use disorder who is coming to the emergency department due to generalized malaise, fatigue, chills, night sweats, low-grade fevers since Monday and progressively worse left hand erythema, edema and tenderness since Sunday.  No history of trauma.  No sore throat, rhinorrhea, dyspnea, wheezing or hemoptysis.  No chest pain, palpitations, diaphoresis, PND, orthopnea or pitting edema lower extremities.  He gets occasional diarrhea and was nauseous earlier this week.  No emesis, constipation, melena or hematochezia.  No flank pain, dysuria, frequency or hematuria.  No polyuria, polydipsia, polyphagia or blurred vision.  ED course: Initial vital signs were temperature 98.8 F, pulse 110, respiration 18, BP 76/59 mmHg and O2 sat 100% on room air.  The patient received ceftriaxone 1 g IVPB, LR 1000 mL bolus and sodium chloride 0.9% 1000 mL IV bolus as well.  Lab work: CBC with a white count 6.0, hemoglobin 11.1 g/dL platelets 322.  Normal PT, INR and PTT.  He is urinalysis showed glucosuria 50, ketonuria 20 and proteinuria of 100 mg/dL.  Coronavirus influenza PCR negative.  Group A strep not detected.  Uric and lactic acid was normal.  CMP  showed a glucose of 148 mg/dL and albumin of 3.3 g/dL.  The rest of the CMP results were unremarkable after calcium level was corrected.  Imaging: Left hand 3 view x-ray showed edema of the left hand, but was otherwise negative.  Portable 1 view chest radiograph was negative.   Review of Systems: As mentioned in the history of present illness. All other systems reviewed and are negative. 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[place])    Chest wall abscess 04/03/2015   Chronic diarrhea of unknown origin 09/10/2017   Cutaneous abscess of chest wall    Depression    per pt   DKA (diabetic ketoacidoses)    DKA, type 1, not at goal [facility]) 04/03/2015   Eczema    Elevated liver enzymes 11/27/2018   GERD (gastroesophageal reflux disease)    HLD (hyperlipidemia) 11/25/2018   no meds   Hyperbilirubinemia    Hyperglycemia due to type 1 diabetes mellitus (HCC)    Hyperkalemia    Hypokalemia 03/17/2013   Left hip pain 04/18/2014   Loss of weight 03/17/2013   Marijuana use 11/25/2018   Osteoarthritis 12/24/2016   Prolonged QT interval 11/27/2018   in setting of RBBB and hypokalemia   Protein-calorie malnutrition, severe ([place]) 03/17/2013   Psoriasis    RBBB 12/23/2018   Chronic since at least 2014   Sepsis ([place]) 01/22/2015   Substance abuse ([place])  Tobacco use disorder 09/13/2015   Type 1 diabetes mellitus [facility])    Past Surgical History:  Procedure Laterality Date   COLONOSCOPY  2020   COLONOSCOPY WITH PROPOFOL N/A 05/07/2020   Procedure: COLONOSCOPY WITH PROPOFOL;  Surgeon: Rush Landmark Telford Nab., MD;  Location: [place];  Service: Gastroenterology;  Laterality: N/A;   ENDOSCOPIC MUCOSAL RESECTION N/A 05/07/2020   Procedure: ENDOSCOPIC MUCOSAL RESECTION;  Surgeon: Rush Landmark Telford Nab., MD;  Location: [place];  Service: Gastroenterology;  Laterality: N/A;   FINGER SURGERY Right    middle finger   SUBMUCOSAL LIFTING INJECTION  05/07/2020   Procedure: SUBMUCOSAL LIFTING INJECTION;  Surgeon: Rush Landmark Telford Nab., MD;  Location: [place];  Service: Gastroenterology;;   Social History:  reports that he quit smoking about 3 years ago. His smoking use included cigarettes. He has a 2.50 pack-year smoking history. He has never used smokeless tobacco. He reports that he does not currently use alcohol after a past usage of about 1.0 - 2.0 standard drink per week. He reports current drug use. Drug: Marijuana.  Allergies  Allergen Reactions   Bee Venom Swelling   Latex Itching and Rash   Penicillins Other (See Comments)    Childhood allergy Has patient had a PCN reaction causing immediate rash, facial/tongue/throat swelling, SOB or lightheadedness with hypotension: NO Has patient had a PCN reaction causing severe rash involving mucus membranes or skin necrosis:NO Has patient had a PCN reaction that required hospitalization NO Has patient had a PCN reaction occurring within the last 10 years: NO If all of the above answers are "NO", then may proceed with Cephalosporin use.    Family History  Problem Relation Age of Onset   Hypertension Father    Diabetes Other        multiple   Lupus Cousin    Stroke Maternal Grandmother    Stroke Paternal Grandmother    Colon cancer Neg Hx    Stomach cancer Neg Hx    Rectal cancer Neg Hx    Esophageal cancer Neg Hx    Colon polyps Neg Hx     Prior to Admission medications   Medication Sig Start Date End Date Taking? Authorizing Provider  Accu-Chek Softclix Lancets lancets Use as instructed 3 times daily. Dx: Type 1 DM 01/26/19   Charlott Rakes, MD  aspirin-sod bicarb-citric acid (ALKA-SELTZER) 325 MG TBEF tablet Take 325 mg by mouth every 6 (six) hours as needed (heartburn).    [provider]  atorvastatin (LIPITOR) 20 MG tablet TAKE 1  TABLET (20 MG TOTAL) BY MOUTH DAILY. 10/10/21 10/10/22  Argentina Donovan, PA-C  Blood Glucose Monitoring Suppl (ACCU-CHEK AVIVA PLUS) w/Device KIT TEST 3 TIMES DAILY 10/19/18   Charlott Rakes, MD  glucose blood (ACCU-CHEK AVIVA PLUS) test strip USE TO CHECK BLOOD SUGAR FOUR TIMES DAILY BEFORE MEALS AND AT BEDTIME 01/26/19   Newlin, Charlane Ferretti, MD  insulin aspart (NOVOLOG) 100 UNIT/ML injection Inject 4 Units into the skin 3 (three) times daily with meals. If blood sugar before meals is above 350, use 6 units. 11/11/21 11/11/22  Charlott Rakes, MD  Insulin Glargine (BASAGLAR KWIKPEN) 100 UNIT/ML Inject 18 Units into the skin at bedtime. 10/11/21 10/11/22  Argentina Donovan, PA-C  INSULIN SYRINGE 1CC/29G 29G X 1/2" 1 ML MISC use as directed 02/15/21   Charlott Rakes, MD  mupirocin ointment (BACTROBAN) 2 % Apply 1 application topically 2 (two) times daily to open area on the scalp. 03/20/21     pantoprazole (PROTONIX) 40 MG  tablet TAKE 1 TABLET (40 MG TOTAL) BY MOUTH DAILY. 10/10/21 10/10/22  Argentina Donovan, PA-C  sildenafil (VIAGRA) 50 MG tablet Take 1 tablet (50 mg total) by mouth daily as needed for erectile dysfunction (At least 24 hours between doses). 12/19/21   Charlott Rakes, MD  Tetrahydrozoline HCl (VISINE OP) Place 1 drop into both eyes daily as needed (dryness / allergies).    [provider]    Physical Exam: Vitals:   01/04/22 0500 01/04/22 0700 01/04/22 0737 01/04/22 0803  BP: 122/83 120/83 120/83 134/89  Pulse: 86 84 84 84  Resp: '19 17 17 12  ' Temp:   98.1 F (36.7 C) 98.7 F (37.1 C)  TempSrc:   Oral Oral  SpO2: 100% 100% 100% 100%  Weight:      Height:       Physical Exam Vitals and nursing note reviewed.  Constitutional:      Appearance: Normal appearance. He is normal weight.  HENT:     Head: Normocephalic.     Mouth/Throat:     Mouth: Mucous membranes are moist.  Eyes:     General: No scleral icterus.    Pupils: Pupils are equal, round, and reactive to light.   Cardiovascular:     Rate and Rhythm: Normal rate and regular rhythm.  Pulmonary:     Effort: Pulmonary effort is normal.     Breath sounds: Normal breath sounds.  Abdominal:     General: Bowel sounds are normal. There is no distension.     Palpations: Abdomen is soft.     Tenderness: There is no abdominal tenderness. There is no right CVA tenderness, left CVA tenderness or guarding.  Musculoskeletal:     Left hand: Swelling and tenderness present. Decreased range of motion.     Cervical back: Neck supple.     Right lower leg: No edema.     Left lower leg: No edema.     Comments: See pictures below.  Skin:    General: Skin is warm and dry.     Findings: Erythema present.  Neurological:     General: No focal deficit present.     Mental Status: He is alert and oriented to person, place, and time.  Psychiatric:        Mood and Affect: Mood normal.        Behavior: Behavior normal.        Data Reviewed:  There are no new results to review at this time.  Assessment and Plan: Principal Problem:   Cellulitis of left hand Observation/MedSurg. Continue ceftriaxone 1 g IVPB daily. Analgesics as needed. Keep blood glucose under control.  Active Problems:   GERD (gastroesophageal reflux disease) Continue Protonix 40 mg p.o. daily    Type 1 diabetes mellitus with hyperglycemia (HCC) Continue Basaglar 15 units p.o. at bedtime. CBG monitoring with RI SS.    HLD (hyperlipidemia) Resume atorvastatin 20 mg p.o. daily.    Normocytic anemia Monitor hematocrit and hemoglobin. Follow-up with primary care provider.   Advance Care Planning:   Code Status: Full Code   Consults:   Family Communication:   Severity of Illness: The appropriate patient status for this patient is OBSERVATION. Observation status is judged to be reasonable and necessary in order to provide the required intensity of service to ensure the patient's safety. The patient's presenting symptoms, physical  exam findings, and initial radiographic and laboratory data in the context of their medical condition is felt to place them at decreased risk  for further clinical deterioration. Furthermore, it is anticipated that the patient will be medically stable for discharge from the hospital within 2 midnights of admission.   Author: Reubin Milan, MD 01/04/2022 8:16 AM  For on call review [email or web address].   This document was prepared using [employer] and may contain some unintended transcription errors.

## 2022-01-04 NOTE — ED Provider Notes (Signed)
[facility] DEPT Provider Note   CSN: [id number] Arrival date & time: 01/03/22  2145     History  Chief Complaint  Patient presents with   Generalized Body Aches         Jared Murray is a 46 y.o. male.  Patient presents to the emergency department for evaluation of intermittent chills, sweats, generalized body aches.  Symptoms ongoing for a week.  Patient reports generalized weakness, poor oral intake.  He has noticed some swelling and pain of the left hand over the last couple of days.  He denies injury.      Home Medications Prior to Admission medications   Medication Sig Start Date End Date Taking? Authorizing Provider  Accu-Chek Softclix Lancets lancets Use as instructed 3 times daily. Dx: Type 1 DM 01/26/19   Charlott Rakes, MD  aspirin-sod bicarb-citric acid (ALKA-SELTZER) 325 MG TBEF tablet Take 325 mg by mouth every 6 (six) hours as needed (heartburn).    [provider]  atorvastatin (LIPITOR) 20 MG tablet TAKE 1 TABLET (20 MG TOTAL) BY MOUTH DAILY. 10/10/21 10/10/22  Argentina Donovan, PA-C  Blood Glucose Monitoring Suppl (ACCU-CHEK AVIVA PLUS) w/Device KIT TEST 3 TIMES DAILY 10/19/18   Charlott Rakes, MD  glucose blood (ACCU-CHEK AVIVA PLUS) test strip USE TO CHECK BLOOD SUGAR FOUR TIMES DAILY BEFORE MEALS AND AT BEDTIME 01/26/19   Newlin, Charlane Ferretti, MD  insulin aspart (NOVOLOG) 100 UNIT/ML injection Inject 4 Units into the skin 3 (three) times daily with meals. If blood sugar before meals is above 350, use 6 units. 11/11/21 11/11/22  Charlott Rakes, MD  Insulin Glargine (BASAGLAR KWIKPEN) 100 UNIT/ML Inject 18 Units into the skin at bedtime. 10/11/21 10/11/22  Argentina Donovan, PA-C  INSULIN SYRINGE 1CC/29G 29G X 1/2" 1 ML MISC use as directed 02/15/21   Charlott Rakes, MD  mupirocin ointment (BACTROBAN) 2 % Apply 1 application topically 2 (two) times daily to open area on the scalp. 03/20/21     pantoprazole (PROTONIX) 40 MG tablet  TAKE 1 TABLET (40 MG TOTAL) BY MOUTH DAILY. 10/10/21 10/10/22  Argentina Donovan, PA-C  sildenafil (VIAGRA) 50 MG tablet Take 1 tablet (50 mg total) by mouth daily as needed for erectile dysfunction (At least 24 hours between doses). 12/19/21   Charlott Rakes, MD  Tetrahydrozoline HCl (VISINE OP) Place 1 drop into both eyes daily as needed (dryness / allergies).    [provider]      Allergies    Bee venom, Latex, and Penicillins    Review of Systems   Review of Systems  Physical Exam Updated Vital Signs BP 122/83   Pulse 86   Temp 98.6 F (37 C) (Oral)   Resp 19   Ht $R'5\' 10"'GE$  (1.778 m)   Wt 61.2 kg   SpO2 100%   BMI 19.37 kg/m  Physical Exam Vitals and nursing note reviewed.  Constitutional:      General: He is not in acute distress.    Appearance: He is well-developed.  HENT:     Head: Normocephalic and atraumatic.     Mouth/Throat:     Mouth: Mucous membranes are moist.  Eyes:     General: Vision grossly intact. Gaze aligned appropriately.     Extraocular Movements: Extraocular movements intact.     Conjunctiva/sclera: Conjunctivae normal.  Cardiovascular:     Rate and Rhythm: Regular rhythm. Tachycardia present.     Pulses: Normal pulses.     Heart sounds: Normal  heart sounds, S1 normal and S2 normal. No murmur heard.   No friction rub. No gallop.  Pulmonary:     Effort: Pulmonary effort is normal. No respiratory distress.     Breath sounds: Normal breath sounds.  Abdominal:     Palpations: Abdomen is soft.     Tenderness: There is no abdominal tenderness. There is no guarding or rebound.     Hernia: No hernia is present.  Musculoskeletal:        General: No swelling.     Left hand: Swelling (Second and third fingers as well as areas over second and third MCP joint) and tenderness present.     Cervical back: Full passive range of motion without pain, normal range of motion and neck supple. No pain with movement, spinous process tenderness or muscular  tenderness. Normal range of motion.     Right lower leg: No edema.     Left lower leg: No edema.  Skin:    General: Skin is warm and dry.     Capillary Refill: Capillary refill takes less than 2 seconds.     Findings: Rash (Eczema on the dorsal aspect of fingers) present. No ecchymosis, erythema, lesion or wound.  Neurological:     Mental Status: He is alert and oriented to person, place, and time.     GCS: GCS eye subscore is 4. GCS verbal subscore is 5. GCS motor subscore is 6.     Cranial Nerves: Cranial nerves 2-12 are intact.     Sensory: Sensation is intact.     Motor: Motor function is intact. No weakness or abnormal muscle tone.     Coordination: Coordination is intact.  Psychiatric:        Mood and Affect: Mood normal.        Speech: Speech normal.        Behavior: Behavior normal.    ED Results / Procedures / Treatments   Labs (all labs ordered are listed, but only abnormal results are displayed) Labs Reviewed  CBC - Abnormal; Notable for the following components:      Result Value   RBC 3.91 (*)    Hemoglobin 11.1 (*)    HCT 33.0 (*)    All other components within normal limits  BASIC METABOLIC PANEL - Abnormal; Notable for the following components:   Sodium 134 (*)    Glucose, Bld 181 (*)    Calcium 8.7 (*)    All other components within normal limits  COMPREHENSIVE METABOLIC PANEL - Abnormal; Notable for the following components:   Glucose, Bld 148 (*)    Calcium 8.5 (*)    Albumin 3.3 (*)    All other components within normal limits  URINALYSIS, ROUTINE W REFLEX MICROSCOPIC - Abnormal; Notable for the following components:   Color, Urine AMBER (*)    Glucose, UA 50 (*)    Ketones, ur 20 (*)    Protein, ur 100 (*)    All other components within normal limits  RESP PANEL BY RT-PCR (FLU A&B, COVID) ARPGX2  GROUP A STREP BY PCR  CULTURE, BLOOD (ROUTINE X 2)  CULTURE, BLOOD (ROUTINE X 2)  URINE CULTURE  URIC ACID  LACTIC ACID, PLASMA  PROTIME-INR  APTT   LACTIC ACID, PLASMA    EKG EKG Interpretation  Date/Time:  Saturday Jan 04 2022 02:53:24 EDT Ventricular Rate:  95 PR Interval:  142 QRS Duration: 134 QT Interval:  372 QTC Calculation: 468 R Axis:   96 Text Interpretation:  Sinus rhythm Right bundle branch block No significant change since last tracing Confirmed by Orpah Greek [phone]) on 01/04/2022 4:37:41 AM  Radiology DG Chest Portable 1 View  Result Date: 01/04/2022 CLINICAL DATA:  Fever EXAM: PORTABLE CHEST 1 VIEW COMPARISON:  None Available. FINDINGS: The heart size and mediastinal contours are within normal limits. Both lungs are clear. The visualized skeletal structures are unremarkable. IMPRESSION: No active disease. Electronically Signed   By: Ulyses Jarred M.D.   On: 01/04/2022 02:24   DG Hand Complete Left  Result Date: 01/03/2022 CLINICAL DATA:  Swelling. EXAM: LEFT HAND - COMPLETE 3+ VIEW COMPARISON:  None Available. FINDINGS: There is diffuse soft tissue swelling of the hand. There is no evidence of fracture or dislocation. There is no evidence of arthropathy or other focal bone abnormality. IMPRESSION: Negative. Electronically Signed   By: Ronney Asters M.D.   On: 01/03/2022 22:53    Procedures Procedures    Medications Ordered in ED Medications  sodium chloride 0.9 % bolus 1,000 mL (0 mLs Intravenous Stopped 01/03/22 2322)  acetaminophen (TYLENOL) tablet 650 mg (650 mg Oral Given 01/04/22 0255)  lactated ringers bolus 1,000 mL (0 mLs Intravenous Stopped 01/04/22 0430)  cefTRIAXone (ROCEPHIN) 1 g in sodium chloride 0.9 % 100 mL IVPB (1 g Intravenous New Bag/Given 01/04/22 0539)    ED Course/ Medical Decision Making/ A&P                           Medical Decision Making Amount and/or Complexity of Data Reviewed Labs: ordered. Decision-making details documented in ED Course. Radiology: ordered and independent interpretation performed. Decision-making details documented in ED Course. ECG/medicine tests:  ordered and independent interpretation performed. Decision-making details documented in ED Course.  Risk OTC drugs. Decision regarding hospitalization.   Patient presents to the emergency department for evaluation of myalgias, chills.  He had not documented any fevers at home but was tachycardic and hypotensive at arrival.  Differential diagnosis considered COVID, influenza, pneumonia, urinary tract infection, sepsis, cellulitis.  Patient without any specific complaints other than swelling of his left hand.  Examination reveals swelling of the second and third fingers as well as the hand over the first and second metacarpals.  Swelling spans multiple joints.  He has range of motion of the joints, doubt septic arthritis.  No specific tenderness over the flexor tendons, able to extend the fingers, does not examine like flexor tenosynovitis at this time.  X-ray without acute findings.  Remainder of work-up has been unremarkable.  COVID-negative.  Strep negative.  Chest x-ray without evidence of pneumonia.    He did have some rigors and at that point recheck of his temperature revealed low-grade fever of 100.6.  This resolved with Tylenol and he feels much better.  No further hypotension, tachycardia has resolved.  Although patient does not have a leukocytosis or elevated lactic acid, he was tachycardic and hypotensive upon arrival.  With his comorbidities it is felt that patient should be observed in the hospital.  The only possible source of fever found so far would be the swelling of the hand which is possibly cellulitis.  Covered with Rocephin.        Final Clinical Impression(s) / ED Diagnoses Final diagnoses:  Fever, unspecified fever cause  Cellulitis of left upper extremity    Rx / DC Orders ED Discharge Orders     None         Rigoberto Repass, Gwenyth Allegra, MD 01/04/22  0640  

## 2022-01-04 NOTE — Plan of Care (Signed)
  Problem: Education: Goal: Knowledge of General Education information will improve Description Including pain rating scale, medication(s)/side effects and non-pharmacologic comfort measures Outcome: Progressing   

## 2022-01-04 NOTE — Plan of Care (Signed)
Plan of care reviewed and discussed with the patient. 

## 2022-01-05 DIAGNOSIS — Z833 Family history of diabetes mellitus: Secondary | ICD-10-CM | POA: Diagnosis not present

## 2022-01-05 DIAGNOSIS — L309 Dermatitis, unspecified: Secondary | ICD-10-CM | POA: Diagnosis present

## 2022-01-05 DIAGNOSIS — R81 Glycosuria: Secondary | ICD-10-CM | POA: Diagnosis present

## 2022-01-05 DIAGNOSIS — E1065 Type 1 diabetes mellitus with hyperglycemia: Secondary | ICD-10-CM | POA: Diagnosis present

## 2022-01-05 DIAGNOSIS — Z681 Body mass index (BMI) 19 or less, adult: Secondary | ICD-10-CM | POA: Diagnosis not present

## 2022-01-05 DIAGNOSIS — E785 Hyperlipidemia, unspecified: Secondary | ICD-10-CM | POA: Diagnosis present

## 2022-01-05 DIAGNOSIS — Z794 Long term (current) use of insulin: Secondary | ICD-10-CM | POA: Diagnosis not present

## 2022-01-05 DIAGNOSIS — Z87891 Personal history of nicotine dependence: Secondary | ICD-10-CM | POA: Diagnosis not present

## 2022-01-05 DIAGNOSIS — Z823 Family history of stroke: Secondary | ICD-10-CM | POA: Diagnosis not present

## 2022-01-05 DIAGNOSIS — Z880 Allergy status to penicillin: Secondary | ICD-10-CM | POA: Diagnosis not present

## 2022-01-05 DIAGNOSIS — R809 Proteinuria, unspecified: Secondary | ICD-10-CM | POA: Diagnosis present

## 2022-01-05 DIAGNOSIS — E440 Moderate protein-calorie malnutrition: Secondary | ICD-10-CM | POA: Diagnosis present

## 2022-01-05 DIAGNOSIS — M1990 Unspecified osteoarthritis, unspecified site: Secondary | ICD-10-CM | POA: Diagnosis present

## 2022-01-05 DIAGNOSIS — L02512 Cutaneous abscess of left hand: Secondary | ICD-10-CM | POA: Diagnosis present

## 2022-01-05 DIAGNOSIS — Z8249 Family history of ischemic heart disease and other diseases of the circulatory system: Secondary | ICD-10-CM | POA: Diagnosis not present

## 2022-01-05 DIAGNOSIS — E7800 Pure hypercholesterolemia, unspecified: Secondary | ICD-10-CM | POA: Diagnosis not present

## 2022-01-05 DIAGNOSIS — R509 Fever, unspecified: Secondary | ICD-10-CM | POA: Diagnosis not present

## 2022-01-05 DIAGNOSIS — E10628 Type 1 diabetes mellitus with other skin complications: Secondary | ICD-10-CM | POA: Diagnosis present

## 2022-01-05 DIAGNOSIS — E876 Hypokalemia: Secondary | ICD-10-CM | POA: Diagnosis present

## 2022-01-05 DIAGNOSIS — K219 Gastro-esophageal reflux disease without esophagitis: Secondary | ICD-10-CM | POA: Diagnosis present

## 2022-01-05 DIAGNOSIS — R824 Acetonuria: Secondary | ICD-10-CM | POA: Diagnosis present

## 2022-01-05 DIAGNOSIS — B951 Streptococcus, group B, as the cause of diseases classified elsewhere: Secondary | ICD-10-CM | POA: Diagnosis present

## 2022-01-05 DIAGNOSIS — Z91040 Latex allergy status: Secondary | ICD-10-CM | POA: Diagnosis not present

## 2022-01-05 DIAGNOSIS — D649 Anemia, unspecified: Secondary | ICD-10-CM | POA: Diagnosis present

## 2022-01-05 DIAGNOSIS — Z1152 Encounter for screening for COVID-19: Secondary | ICD-10-CM | POA: Diagnosis not present

## 2022-01-05 DIAGNOSIS — L03114 Cellulitis of left upper limb: Secondary | ICD-10-CM | POA: Diagnosis present

## 2022-01-05 LAB — GLUCOSE, CAPILLARY
Glucose-Capillary: 36 mg/dL — CL (ref 70–99)
Glucose-Capillary: 57 mg/dL — ABNORMAL LOW (ref 70–99)
Glucose-Capillary: 72 mg/dL (ref 70–99)
Glucose-Capillary: 181 mg/dL — ABNORMAL HIGH (ref 70–99)
Glucose-Capillary: 75 mg/dL (ref 70–99)
Glucose-Capillary: 75 mg/dL (ref 70–99)
Glucose-Capillary: 204 mg/dL — ABNORMAL HIGH (ref 70–99)

## 2022-01-05 LAB — HIV ANTIBODY (ROUTINE TESTING W REFLEX): HIV Screen 4th Generation wRfx: NONREACTIVE

## 2022-01-05 LAB — CBC
HCT: 32.1 % — ABNORMAL LOW (ref 39.0–52.0)
Hemoglobin: 10.7 g/dL — ABNORMAL LOW (ref 13.0–17.0)
MCH: 28.1 pg (ref 26.0–34.0)
MCHC: 33.3 g/dL (ref 30.0–36.0)
MCV: 84.3 fL (ref 80.0–100.0)
Platelets: 312 10*3/uL (ref 150–400)
RBC: 3.81 MIL/uL — ABNORMAL LOW (ref 4.22–5.81)
RDW: 13.5 % (ref 11.5–15.5)
WBC: 8.8 10*3/uL (ref 4.0–10.5)
nRBC: 0 % (ref 0.0–0.2)

## 2022-01-05 LAB — URINE CULTURE: Culture: <10000 — AB

## 2022-01-05 NOTE — Progress Notes (Addendum)
  Progress Note   Patient: Jared Murray MVE:[id number] DOB: Dec 13, 1975 DOA: 01/03/2022     0 DOS: the patient was seen and examined on 01/05/2022   Brief hospital course: 46 y.o. male with medical history significant of history of pericarditis, history of AKI, seasonal allergies, anxiety, depression and vascular necrosis of the hip, chest wall abscess, chronic diarrhea, type I DKA episodes, eczema, history of elevated liver enzymes, GERD hyperlipidemia, hyperbilirubinemia, hyperkalemia, hypokalemia, history of cannabis use, osteoarthritis, history of prolonged QT interval, history of sepsis, substance abuse, tobacco use disorder who is coming to the emergency department due to generalized malaise, fatigue, chills, night sweats, low-grade fevers since Monday and progressively worse left hand erythema, edema and tenderness since Sunday.  No history of trauma.  No sore throat, rhinorrhea, dyspnea, wheezing or hemoptysis.  No chest pain, palpitations, diaphoresis, PND, orthopnea or pitting edema lower extremities.  He gets occasional diarrhea and was nauseous earlier this week.  No emesis, constipation, melena or hematochezia.  No flank pain, dysuria, frequency or hematuria.  No polyuria, polydipsia, polyphagia or blurred vision  Assessment and Plan: No notes have been filed under this hospital service. Service: Hospitalist Principal Problem:   Cellulitis of left hand Continued on ceftriaxone 1 g IVPB daily. Analgesics as needed. Controlled with current regimen Edematous on exam, see images on H and P.  Edema seems to be improving with current abx Given very poorly controlled DM, consulted Orthopedic Surgery. Thus far, recs for Volar splint and sling elevator   Active Problems:   GERD (gastroesophageal reflux disease) Continue Protonix 40 mg p.o. daily     Type 1 diabetes mellitus with hyperglycemia (HCC) Continue Basaglar 15 units p.o. at bedtime. CBG monitoring with RI SS.     HLD  (hyperlipidemia) Resume atorvastatin 20 mg p.o. daily.     Normocytic anemia Monitor hematocrit and hemoglobin. Follow-up with primary care provider.     Subjective: States L hand remains swollen and tender this AM  Physical Exam: Vitals:   01/04/22 1937 01/04/22 2101 01/05/22 0629 01/05/22 1331  BP: 113/83 125/84 (!) 136/92 127/86  Pulse: 88 94 91 91  Resp: '18 16 16 16  '$ Temp: 99.1 F (37.3 C) 98.8 F (37.1 C) 98.6 F (37 C) 98.6 F (37 C)  TempSrc: Oral Oral Oral Oral  SpO2: 100% 100% 100% 100%  Weight:      Height:       General exam: Awake, laying in bed, in nad Respiratory system: Normal respiratory effort, no wheezing Cardiovascular system: regular rate, s1, s2 Gastrointestinal system: Soft, nondistended, positive BS Central nervous system: CN2-12 grossly intact, strength intact Extremities: Perfused, no clubbing, L hand edematous with open wound between first two digits and drainage Skin: Normal skin turgor, no notable skin lesions seen Psychiatry: Mood normal // no visual hallucinations   Data Reviewed:  Labs reviewed: WBC 8.8, Hgb 10.7  Family Communication: Pt in room, family not at bedside  Disposition: Status is: Observation The patient will require care spanning > 2 midnights and should be moved to inpatient because: Severity of illness  Planned Discharge Destination: Home    Author: Marylu Lund, MD 01/05/2022 5:46 PM  For on call review [email or web address].

## 2022-01-05 NOTE — Progress Notes (Signed)
Orthopedic Tech Progress Note Patient Details:  Jared Murray [birth year] [id number]  Ortho Devices Type of Ortho Device: Ace wrap, Sling arm elevator, Short arm splint      [place] Pink 01/05/2022, 7:30 PM

## 2022-01-05 NOTE — Hospital Course (Signed)
46 y.o. male with medical history significant of history of pericarditis, history of AKI, seasonal allergies, anxiety, depression and vascular necrosis of the hip, chest wall abscess, chronic diarrhea, type I DKA episodes, eczema, history of elevated liver enzymes, GERD hyperlipidemia, hyperbilirubinemia, hyperkalemia, hypokalemia, history of cannabis use, osteoarthritis, history of prolonged QT interval, history of sepsis, substance abuse, tobacco use disorder who is coming to the emergency department due to generalized malaise, fatigue, chills, night sweats, low-grade fevers since Monday and progressively worse left hand erythema, edema and tenderness since Sunday.  No history of trauma.  No sore throat, rhinorrhea, dyspnea, wheezing or hemoptysis.  No chest pain, palpitations, diaphoresis, PND, orthopnea or pitting edema lower extremities.  He gets occasional diarrhea and was nauseous earlier this week.  No emesis, constipation, melena or hematochezia.  No flank pain, dysuria, frequency or hematuria.  No polyuria, polydipsia, polyphagia or blurred vision

## 2022-01-05 NOTE — Plan of Care (Signed)
  Problem: Education: Goal: Knowledge of General Education information will improve Description Including pain rating scale, medication(s)/side effects and non-pharmacologic comfort measures Outcome: Progressing   Problem: Health Behavior/Discharge Planning: Goal: Ability to manage health-related needs will improve Outcome: Progressing   

## 2022-01-05 NOTE — Plan of Care (Signed)
Plan of care reviewed and discussed. °

## 2022-01-06 DIAGNOSIS — L03114 Cellulitis of left upper limb: Secondary | ICD-10-CM | POA: Diagnosis not present

## 2022-01-06 DIAGNOSIS — E440 Moderate protein-calorie malnutrition: Secondary | ICD-10-CM | POA: Insufficient documentation

## 2022-01-06 LAB — CBC
HCT: 27.4 % — ABNORMAL LOW (ref 39.0–52.0)
Hemoglobin: 9.4 g/dL — ABNORMAL LOW (ref 13.0–17.0)
MCH: 28.6 pg (ref 26.0–34.0)
MCHC: 34.3 g/dL (ref 30.0–36.0)
MCV: 83.3 fL (ref 80.0–100.0)
Platelets: 299 10*3/uL (ref 150–400)
RBC: 3.29 MIL/uL — ABNORMAL LOW (ref 4.22–5.81)
RDW: 13.2 % (ref 11.5–15.5)
WBC: 5.5 10*3/uL (ref 4.0–10.5)
nRBC: 0 % (ref 0.0–0.2)

## 2022-01-06 LAB — COMPREHENSIVE METABOLIC PANEL
ALT: 18 U/L (ref 0–44)
AST: 21 U/L (ref 15–41)
Albumin: 2.6 g/dL — ABNORMAL LOW (ref 3.5–5.0)
Alkaline Phosphatase: 84 U/L (ref 38–126)
Anion gap: 6 (ref 5–15)
BUN: <5 mg/dL — ABNORMAL LOW (ref 6–20)
CO2: 26 mmol/L (ref 22–32)
Calcium: 7.9 mg/dL — ABNORMAL LOW (ref 8.9–10.3)
Chloride: 103 mmol/L (ref 98–111)
Creatinine, Ser: 0.85 mg/dL (ref 0.61–1.24)
GFR, Estimated: >60 mL/min (ref >60)
Glucose, Bld: 93 mg/dL (ref 70–99)
Potassium: 3.2 mmol/L — ABNORMAL LOW (ref 3.5–5.1)
Sodium: 135 mmol/L (ref 135–145)
Total Bilirubin: 0.8 mg/dL (ref 0.3–1.2)
Total Protein: 5.5 g/dL — ABNORMAL LOW (ref 6.5–8.1)

## 2022-01-06 LAB — SURGICAL PCR SCREEN
MRSA, PCR: NEGATIVE
Staphylococcus aureus: POSITIVE — AB

## 2022-01-06 LAB — GLUCOSE, CAPILLARY
Glucose-Capillary: 43 mg/dL — CL (ref 70–99)
Glucose-Capillary: 71 mg/dL (ref 70–99)
Glucose-Capillary: 129 mg/dL — ABNORMAL HIGH (ref 70–99)
Glucose-Capillary: 107 mg/dL — ABNORMAL HIGH (ref 70–99)
Glucose-Capillary: 181 mg/dL — ABNORMAL HIGH (ref 70–99)
Glucose-Capillary: 188 mg/dL — ABNORMAL HIGH (ref 70–99)

## 2022-01-06 MED ORDER — ENSURE PRE-SURGERY PO LIQD
296.0000 mL | Freq: Once | ORAL | Status: AC
Start: 2022-01-07 — End: 2022-01-07
  Administered 2022-01-07: 296 mL via ORAL
  Filled 2022-01-06: qty 296

## 2022-01-06 MED ORDER — CHLORHEXIDINE GLUCONATE CLOTH 2 % EX PADS
6.0000 | MEDICATED_PAD | Freq: Every day | CUTANEOUS | Status: DC
  Administered 2022-01-07: 6 via TOPICAL

## 2022-01-06 MED ORDER — MUPIROCIN 2 % EX OINT
1.0000 "application " | TOPICAL_OINTMENT | Freq: Two times a day (BID) | CUTANEOUS | Status: DC
  Administered 2022-01-06 – 2022-01-09 (×6): 1 via NASAL
  Filled 2022-01-06: qty 22

## 2022-01-06 MED ORDER — POTASSIUM CHLORIDE CRYS ER 20 MEQ PO TBCR
40.0000 meq | EXTENDED_RELEASE_TABLET | ORAL | Status: AC
  Administered 2022-01-06 (×2): 40 meq via ORAL
  Filled 2022-01-06 (×2): qty 2

## 2022-01-06 MED ORDER — ADULT MULTIVITAMIN W/MINERALS CH
1.0000 | ORAL_TABLET | Freq: Every day | ORAL | Status: DC
  Administered 2022-01-06 – 2022-01-09 (×3): 1 via ORAL
  Filled 2022-01-06 (×3): qty 1

## 2022-01-06 MED ORDER — JUVEN PO PACK
1.0000 | PACK | Freq: Two times a day (BID) | ORAL | Status: DC
  Administered 2022-01-06 – 2022-01-09 (×5): 1 via ORAL
  Filled 2022-01-06 (×7): qty 1

## 2022-01-06 MED ORDER — GLUCERNA SHAKE PO LIQD
237.0000 mL | Freq: Two times a day (BID) | ORAL | Status: DC
  Administered 2022-01-06 – 2022-01-09 (×5): 237 mL via ORAL
  Filled 2022-01-06 (×7): qty 237

## 2022-01-06 NOTE — Progress Notes (Signed)
Initial Nutrition Assessment  DOCUMENTATION CODES:   Non-severe (moderate) malnutrition in context of chronic illness  INTERVENTION:   -Glucerna Shake po BID, each supplement provides 220 kcal and 10 grams of protein   -1 packet Juven BID, each packet provides 95 calories, 2.5 grams of protein (collagen), and 9.8 grams of carbohydrate (3 grams sugar); also contains 7 grams of L-arginine and L-glutamine, 300 mg vitamin C, 15 mg vitamin E, 1.2 mcg vitamin B-12, 9.5 mg zinc, 200 mg calcium, and 1.5 g  Calcium Beta-hydroxy-Beta-methylbutyrate to support wound healing   -Multivitamin with minerals daily  -Provided "Carbohydrate Counting" handout in room  NUTRITION DIAGNOSIS:   Moderate Malnutrition related to chronic illness (uncontrolled DM) as evidenced by mild fat depletion, mild muscle depletion.  GOAL:   Patient will meet greater than or equal to 90% of their needs  MONITOR:   PO intake, Supplement acceptance, Labs, Weight trends, I & O's, Skin  REASON FOR ASSESSMENT:   Malnutrition Screening Tool, Consult Diet education  ASSESSMENT:   46 y.o. male with medical history significant of history of pericarditis, history of AKI, seasonal allergies, anxiety, depression and vascular necrosis of the hip, chest wall abscess, chronic diarrhea, type I DKA episodes, eczema, history of elevated liver enzymes, GERD hyperlipidemia, hyperbilirubinemia, hyperkalemia, hypokalemia, history of cannabis use, osteoarthritis, history of prolonged QT interval, history of sepsis, substance abuse, tobacco use disorder who is coming to the emergency department due to generalized malaise, fatigue, chills, night sweats, low-grade fevers since Monday and progressively worse left hand erythema, edema and tenderness since Sunday. Admitted for cellulitis of the left hand.  Patient in room, no family at bedside. Per pt, he eats whatever his wife makes him to eat and he has not been portioning out his food so he  feels he may have been consuming large amounts of carbohydrates.  Requests RD call wife to discuss education. Attempted to call pt's wife but no answer.  RD provided carbohydrate counting handout in room with notes. Discussed with patient.  He reports sometimes skipping meals. Emphasized the importance of consistent intakes and not skipping meals.   Pt consumed 50% of breakfast this morning.  Per chart review, plan is for I&D of left hand. Will order Glucerna shakes and Juven to aid in wound healing.   Per weight records, weight documented for this admission is the same as what is recorded on 5/4. Needs updated weight for admission.  Medications: KLOR-CON  Labs reviewed: CBGs: 43-204 Low K   NUTRITION - FOCUSED PHYSICAL EXAM:  Flowsheet Row Most Recent Value  Orbital Region No depletion  Upper Arm Region Mild depletion  Thoracic and Lumbar Region Mild depletion  Buccal Region Mild depletion  Temple Region Mild depletion  Clavicle Bone Region Mild depletion  Clavicle and Acromion Bone Region No depletion  Scapular Bone Region No depletion  Dorsal Hand Unable to assess  Patellar Region Unable to assess  Anterior Thigh Region Unable to assess  Posterior Calf Region Unable to assess  Edema (RD Assessment) None  Hair Unable to assess  [covered]  Eyes Reviewed  Mouth Reviewed  Skin Reviewed  Nails Reviewed       Diet Order:   Diet Order             Diet heart healthy/carb modified Room service appropriate? Yes; Fluid consistency: Thin  Diet effective now                   EDUCATION NEEDS:   Education needs  have been addressed  Skin:  Skin Assessment: Skin Integrity Issues: Skin Integrity Issues:: Other (Comment) Other: left hand cellulitis  Last BM:  5/21 -type 6  Height:   Ht Readings from Last 1 Encounters:  01/03/22 '5\' 10"'$  (1.778 m)    Weight:   Wt Readings from Last 1 Encounters:  01/03/22 61.2 kg    BMI:  Body mass index is 19.37  kg/m.  Estimated Nutritional Needs:   Kcal:  1800-2000  Protein:  75-95g  Fluid:  2L/day  Clayton Bibles, MS, RD, LDN Inpatient Clinical Dietitian Contact information available via Amion

## 2022-01-06 NOTE — Progress Notes (Signed)
Orthopedic Tech Progress Note Patient Details:  Jared Murray November 03, 1975 [id number]  Patient ID: Jared Murray, male   DOB: 1975/09/09, 46 y.o.   MRN: [id number]  Jared Murray 01/06/2022, 9:03 AMReplaced volar splint with shorter version because IV is in left forearm.

## 2022-01-06 NOTE — Progress Notes (Addendum)
  Progress Note   Patient: Jared Murray NLZ:[id number] DOB: 1976-01-10 DOA: 01/03/2022     1 DOS: the patient was seen and examined on 01/06/2022   Brief hospital course: 46 y.o. male with medical history significant of history of pericarditis, history of AKI, seasonal allergies, anxiety, depression and vascular necrosis of the hip, chest wall abscess, chronic diarrhea, type I DKA episodes, eczema, history of elevated liver enzymes, GERD hyperlipidemia, hyperbilirubinemia, hyperkalemia, hypokalemia, history of cannabis use, osteoarthritis, history of prolonged QT interval, history of sepsis, substance abuse, tobacco use disorder who is coming to the emergency department due to generalized malaise, fatigue, chills, night sweats, low-grade fevers since Monday and progressively worse left hand erythema, edema and tenderness since Sunday.  No history of trauma.  No sore throat, rhinorrhea, dyspnea, wheezing or hemoptysis.  No chest pain, palpitations, diaphoresis, PND, orthopnea or pitting edema lower extremities.  He gets occasional diarrhea and was nauseous earlier this week.  No emesis, constipation, melena or hematochezia.  No flank pain, dysuria, frequency or hematuria.  No polyuria, polydipsia, polyphagia or blurred vision  Assessment and Plan: No notes have been filed under this hospital service. Service: Hospitalist Principal Problem:   Cellulitis of left hand Continued on ceftriaxone 1 g IVPB daily. Analgesics as needed. Controlled with current regimen Edema seems to be improving with current abx and with elevation Given very poorly controlled DM, consulted Orthopedic Surgery. Recs for Volar splint and sling elevator noted Appreciate input by Orthopedic Surgery. Plan for I/D L hand tomorrow   Active Problems:   GERD (gastroesophageal reflux disease) Continue Protonix 40 mg p.o. daily     Type 1 diabetes mellitus with hyperglycemia (HCC) Continue Basaglar 15 units p.o. at bedtime. CBG  monitoring with RI SS.     HLD (hyperlipidemia) Resume atorvastatin 20 mg p.o. daily.     Normocytic anemia Monitor hematocrit and hemoglobin. Follow-up with primary care provider.  Hypokalemia -Will replace -repeat lytes in AM    Subjective: Reports decreased swelling of L hand  Physical Exam: Vitals:   01/05/22 0629 01/05/22 1331 01/05/22 2206 01/06/22 0605  BP: (!) 136/92 127/86 120/83 124/81  Pulse: 91 91 86 92  Resp: '16 16 16 16  '$ Temp: 98.6 F (37 C) 98.6 F (37 C) 98.7 F (37.1 C) 98.3 F (36.8 C)  TempSrc: Oral Oral Oral Oral  SpO2: 100% 100% 100% 100%  Weight:      Height:       General exam: Conversant, in no acute distress Respiratory system: normal chest rise, clear, no audible wheezing Cardiovascular system: regular rhythm, s1-s2 Gastrointestinal system: Nondistended, nontender, pos BS Central nervous system: No seizures, no tremors Extremities: No cyanosis, L hand swelling improving, currently in sling elevator  Skin: No rashes, no pallor Psychiatry: Affect normal // no auditory hallucinations   Data Reviewed:  Labs reviewed: WBC 5.5, Hgb 9.4  Family Communication: Pt in room, family currently at bedside  Disposition: Status is: Inpatient Continue inpatient stay because: Severity of illness  Planned Discharge Destination: Home    Author: Marylu Lund, MD 01/06/2022 3:48 PM  For on call review [email or web address].

## 2022-01-06 NOTE — Progress Notes (Signed)
CBG recheck: 71. Patient instructed to push call light if he begins to feel like his sugar is dropping again.  Pt acknowledges understanding to call.

## 2022-01-06 NOTE — Progress Notes (Signed)
Called to patient's room with patient stating "I think my sugar is getting low again."  CBG: 43. Pt. Slightly diaphoretic, and reports feeling "swimmy headed. A & O x 4. See Hypoglycemia Protocol order set.  8 oz. Apple juice given. Will recheck CBG in 15-20 minutes.

## 2022-01-06 NOTE — Consult Note (Cosign Needed Addendum)
ORTHOPAEDIC CONSULTATION  REQUESTING PHYSICIAN: Donne Hazel, MD  Chief Complaint: Left hand pain and swelling  HPI: Jared Murray is a 46 y.o. male with history of pericarditis, seasonal allergies, anxiety/depression, avascular necrosis of the hip, chest wall abscess, chronic diarrhea, poorly controlled diabetes (last A1C 11 on 12/17/21), GERD, hyperlipidemia, cannabis use, sepsis, substance abuse, tobacco use disorder who originally presented to the [facility] ED with fatigue, chills, and favers on 5/20 though these symptoms had been ongoing for a week. He also noted progressively worsening redness, swelling and pain of the left hand for a week. He has been inpatient for 2 days and has received IV Rocephin. Today he states that pain and swelling have improved but he continues to have diffuse pain at all fingers of the left hand. Denies wrist pain. Pain improves with immobilization and pain medication, worse with movement.   Past Medical History:  Diagnosis Date   Acute pericarditis 03/31/2016   AKI (acute kidney injury) ([place]) 01/22/2015   Allergy    Anxiety    per pt   Arthritis    Avascular necrosis ([place]) 02/12/2015   Avascular necrosis of bone of hip ([place])    Chest wall abscess 04/03/2015   Chronic diarrhea of unknown origin 09/10/2017   Cutaneous abscess of chest wall    Depression    per pt   DKA (diabetic ketoacidoses)    DKA, type 1, not at goal [facility]) 04/03/2015   Eczema    Elevated liver enzymes 11/27/2018   GERD (gastroesophageal reflux disease)    HLD (hyperlipidemia) 11/25/2018   no meds   Hyperbilirubinemia    Hyperglycemia due to type 1 diabetes mellitus (HCC)    Hyperkalemia    Hypokalemia 03/17/2013   Left hip pain 04/18/2014   Loss of weight 03/17/2013   Marijuana use 11/25/2018   Osteoarthritis 12/24/2016   Prolonged QT interval 11/27/2018   in setting of RBBB and hypokalemia   Protein-calorie malnutrition, severe ([place]) 03/17/2013   Psoriasis    RBBB 12/23/2018   Chronic  since at least 2014   Sepsis ([place]) 01/22/2015   Substance abuse ([place])    Tobacco use disorder 09/13/2015   Type 1 diabetes mellitus ([place])    Past Surgical History:  Procedure Laterality Date   COLONOSCOPY  2020   COLONOSCOPY WITH PROPOFOL N/A 05/07/2020   Procedure: COLONOSCOPY WITH PROPOFOL;  Surgeon: Irving Copas., MD;  Location: [place];  Service: Gastroenterology;  Laterality: N/A;   ENDOSCOPIC MUCOSAL RESECTION N/A 05/07/2020   Procedure: ENDOSCOPIC MUCOSAL RESECTION;  Surgeon: Rush Landmark Telford Nab., MD;  Location: [place];  Service: Gastroenterology;  Laterality: N/A;   FINGER SURGERY Right    middle finger   SUBMUCOSAL LIFTING INJECTION  05/07/2020   Procedure: SUBMUCOSAL LIFTING INJECTION;  Surgeon: Rush Landmark Telford Nab., MD;  [facility] END[facility]COPY;  Service: Gastroenterology;;   Social History   Socioeconomic History   Marital status: Married    Spouse name: Not on file   Number of children: Not on file   Years of education: Not on file   Highest education level: Not on file  Occupational History   Not on file  Tobacco Use   Smoking status: Former    Packs/day: 0.25    Years: 10.00    Pack years: 2.50    Types: Cigarettes    Quit date: 11/26/2018    Years since quitting: 3.1   Smokeless tobacco: Never  Vaping Use   Vaping Use: Never used  Substance  and Sexual Activity   Alcohol use: Not Currently    Alcohol/week: 1.0 - 2.0 standard drink    Types: 1 - 2 Shots of liquor per week    Comment: none since 2020   Drug use: Yes    Types: Marijuana   Sexual activity: Yes  Other Topics Concern   Not on file  Social History Narrative   Lives in [place] - works as a Education officer, museum   Married   Social Determinants of Radio broadcast assistant Strain: Not on Art therapist Insecurity: Not on Pensions consultant Needs: Not on file  Physical Activity: Not on file  Stress: Not on file  Social Connections: Not on file   Family History  Problem  Relation Age of Onset   Hypertension Father    Diabetes Other        multiple   Lupus Cousin    Stroke Maternal Grandmother    Stroke Paternal Grandmother    Colon cancer Neg Hx    Stomach cancer Neg Hx    Rectal cancer Neg Hx    Esophageal cancer Neg Hx    Colon polyps Neg Hx    Allergies  Allergen Reactions   Bee Venom Swelling   Latex Itching and Rash   Penicillins Other (See Comments)    Childhood allergy Has patient had a PCN reaction causing immediate rash, facial/tongue/throat swelling, SOB or lightheadedness with hypotension: NO Has patient had a PCN reaction causing severe rash involving mucus membranes or skin necrosis:NO Has patient had a PCN reaction that required hospitalization NO Has patient had a PCN reaction occurring within the last 10 years: NO If all of the above answers are "NO", then may proceed with Cephalosporin use.     Positive ROS: All other systems have been reviewed and were otherwise negative with the exception of those mentioned in the HPI and as above.  Physical Exam: General: Alert, no acute distress Cardiovascular: No pedal edema Respiratory: No cyanosis, no use of accessory musculature GI: No organomegaly, abdomen is soft and non-tender Skin: No lesions in the area of chief complaint Neurologic: Sensation intact distally Psychiatric: Patient is competent for consent with normal mood and affect Lymphatic: No axillary or cervical lymphadenopathy  MUSCULOSKELETAL: Patient endorses sensation to all fingers. Able to flex, extend, an abduct all fingers however not able to make a full fist. 2+ radial pulse. Area of drainage between 1st and 2nd MCP joints as seen below. Skin was wet and macerated, sloughing off on exam. Not particularly erythematous, swelling looks to be improved from previous clinical pictures in chart. No fusiform swelling. No wrist TTP.        Assessment/Plan: Cellulitis of left hand in patient with poorly controlled  diabetes - no current signs of tenosynovitis, no streaking - continue IV antibiotics - continue splint and sling arm elevator. Will put in order to new splint with ortho tech that does not cover patient's IV - BID dressing changes for weeping area on dorsal hand between 1st and 2nd MCP joint. Would encourage to keep this to dry gauze, as patient's skin around that area is fairly macerated - Discussed with Dr. Mardelle Matte, will plan for I&D of left hand tomorrow. Please keep NPO after midnight    Ventura Bruns, PA-C    01/06/2022 7:22 AM

## 2022-01-07 ENCOUNTER — Encounter (HOSPITAL_COMMUNITY)
Admission: EM | Disposition: A | Discharge status: Home / Self Care | Payer: Self-pay | Source: Home / Self Care | Attending: Internal Medicine

## 2022-01-07 ENCOUNTER — Inpatient Hospital Stay (HOSPITAL_COMMUNITY): Payer: Medicare Other | Admitting: Certified Registered"

## 2022-01-07 DIAGNOSIS — L02512 Cutaneous abscess of left hand: Secondary | ICD-10-CM

## 2022-01-07 DIAGNOSIS — L03114 Cellulitis of left upper limb: Secondary | ICD-10-CM | POA: Diagnosis not present

## 2022-01-07 HISTORY — PX: INCISION AND DRAINAGE ABSCESS: SHX5864

## 2022-01-07 LAB — COMPREHENSIVE METABOLIC PANEL
ALT: 18 U/L (ref 0–44)
AST: 24 U/L (ref 15–41)
Albumin: 2.6 g/dL — ABNORMAL LOW (ref 3.5–5.0)
Alkaline Phosphatase: 88 U/L (ref 38–126)
Anion gap: 8 (ref 5–15)
BUN: 8 mg/dL (ref 6–20)
CO2: 24 mmol/L (ref 22–32)
Calcium: 8.3 mg/dL — ABNORMAL LOW (ref 8.9–10.3)
Chloride: 99 mmol/L (ref 98–111)
Creatinine, Ser: 0.94 mg/dL (ref 0.61–1.24)
GFR, Estimated: >60 mL/min (ref >60)
Glucose, Bld: 286 mg/dL — ABNORMAL HIGH (ref 70–99)
Potassium: 4.9 mmol/L (ref 3.5–5.1)
Sodium: 131 mmol/L — ABNORMAL LOW (ref 135–145)
Total Bilirubin: 1.1 mg/dL (ref 0.3–1.2)
Total Protein: 5.6 g/dL — ABNORMAL LOW (ref 6.5–8.1)

## 2022-01-07 LAB — CBC
HCT: 28.9 % — ABNORMAL LOW (ref 39.0–52.0)
Hemoglobin: 9.9 g/dL — ABNORMAL LOW (ref 13.0–17.0)
MCH: 28.9 pg (ref 26.0–34.0)
MCHC: 34.3 g/dL (ref 30.0–36.0)
MCV: 84.5 fL (ref 80.0–100.0)
Platelets: 343 10*3/uL (ref 150–400)
RBC: 3.42 MIL/uL — ABNORMAL LOW (ref 4.22–5.81)
RDW: 13.2 % (ref 11.5–15.5)
WBC: 3.5 10*3/uL — ABNORMAL LOW (ref 4.0–10.5)
nRBC: 0 % (ref 0.0–0.2)

## 2022-01-07 LAB — GLUCOSE, CAPILLARY
Glucose-Capillary: 351 mg/dL — ABNORMAL HIGH (ref 70–99)
Glucose-Capillary: 275 mg/dL — ABNORMAL HIGH (ref 70–99)
Glucose-Capillary: 278 mg/dL — ABNORMAL HIGH (ref 70–99)
Glucose-Capillary: 375 mg/dL — ABNORMAL HIGH (ref 70–99)
Glucose-Capillary: 358 mg/dL — ABNORMAL HIGH (ref 70–99)

## 2022-01-07 SURGERY — INCISION AND DRAINAGE, ABSCESS
Anesthesia: General | Laterality: Left

## 2022-01-07 MED ORDER — SODIUM CHLORIDE 0.9 % IV SOLN
3.0000 g | Freq: Four times a day (QID) | INTRAVENOUS | Status: DC
  Administered 2022-01-07 – 2022-01-09 (×8): 3 g via INTRAVENOUS
  Filled 2022-01-07 (×9): qty 8

## 2022-01-07 MED ORDER — ADULT MULTIVITAMIN W/MINERALS CH: 1.0000 | ORAL_TABLET | Freq: Every day | ORAL | Status: DC

## 2022-01-07 MED ORDER — FENTANYL CITRATE PF 50 MCG/ML IJ SOSY: 25.0000 ug | PREFILLED_SYRINGE | INTRAMUSCULAR | Status: DC | PRN

## 2022-01-07 MED ORDER — METHOCARBAMOL 500 MG PO TABS: 500.0000 mg | ORAL_TABLET | Freq: Four times a day (QID) | ORAL | Status: DC | PRN

## 2022-01-07 MED ORDER — DEXAMETHASONE SODIUM PHOSPHATE 10 MG/ML IJ SOLN
INTRAMUSCULAR | Status: AC
  Filled 2022-01-07: qty 1

## 2022-01-07 MED ORDER — ACETAMINOPHEN 500 MG PO TABS
1000.0000 mg | ORAL_TABLET | Freq: Four times a day (QID) | ORAL | Status: AC
  Administered 2022-01-07 – 2022-01-08 (×4): 1000 mg via ORAL
  Filled 2022-01-07 (×4): qty 2

## 2022-01-07 MED ORDER — BISACODYL 10 MG RE SUPP: 10.0000 mg | Freq: Every day | RECTAL | Status: DC | PRN

## 2022-01-07 MED ORDER — PROPOFOL 10 MG/ML IV BOLUS
INTRAVENOUS | Status: DC | PRN
  Administered 2022-01-07: 80 mg via INTRAVENOUS
  Administered 2022-01-07: 120 mg via INTRAVENOUS

## 2022-01-07 MED ORDER — EPHEDRINE SULFATE-NACL 50-0.9 MG/10ML-% IV SOSY
PREFILLED_SYRINGE | INTRAVENOUS | Status: DC | PRN
  Administered 2022-01-07: 10 mg via INTRAVENOUS

## 2022-01-07 MED ORDER — DIPHENHYDRAMINE HCL 25 MG PO CAPS: 25.0000 mg | ORAL_CAPSULE | Freq: Four times a day (QID) | ORAL | Status: DC | PRN

## 2022-01-07 MED ORDER — FENTANYL CITRATE (PF) 100 MCG/2ML IJ SOLN
INTRAMUSCULAR | Status: DC | PRN
  Administered 2022-01-07: 100 ug via INTRAVENOUS

## 2022-01-07 MED ORDER — ONDANSETRON HCL 4 MG/2ML IJ SOLN
INTRAMUSCULAR | Status: DC | PRN
  Administered 2022-01-07: 4 mg via INTRAVENOUS

## 2022-01-07 MED ORDER — MIDAZOLAM HCL 2 MG/2ML IJ SOLN
INTRAMUSCULAR | Status: DC | PRN
  Administered 2022-01-07: 2 mg via INTRAVENOUS

## 2022-01-07 MED ORDER — ACETAMINOPHEN 325 MG PO TABS: 325.0000 mg | ORAL_TABLET | Freq: Four times a day (QID) | ORAL | Status: DC | PRN

## 2022-01-07 MED ORDER — OXYCODONE HCL 5 MG PO TABS
10.0000 mg | ORAL_TABLET | ORAL | Status: DC | PRN
  Administered 2022-01-09: 10 mg via ORAL
  Filled 2022-01-07: qty 2

## 2022-01-07 MED ORDER — AMISULPRIDE (ANTIEMETIC) 5 MG/2ML IV SOLN: 10.0000 mg | Freq: Once | INTRAVENOUS | Status: DC | PRN

## 2022-01-07 MED ORDER — KETAMINE HCL 50 MG/5ML IJ SOSY
PREFILLED_SYRINGE | INTRAMUSCULAR | Status: AC
  Filled 2022-01-07: qty 5

## 2022-01-07 MED ORDER — FENTANYL CITRATE (PF) 100 MCG/2ML IJ SOLN
INTRAMUSCULAR | Status: AC
  Filled 2022-01-07: qty 2

## 2022-01-07 MED ORDER — ACETAMINOPHEN 500 MG PO TABS: 1000.0000 mg | ORAL_TABLET | Freq: Once | ORAL | Status: DC

## 2022-01-07 MED ORDER — FAMOTIDINE 20 MG PO TABS: 20.0000 mg | ORAL_TABLET | Freq: Two times a day (BID) | ORAL | Status: DC | PRN

## 2022-01-07 MED ORDER — MIDAZOLAM HCL 2 MG/2ML IJ SOLN
INTRAMUSCULAR | Status: AC
  Filled 2022-01-07: qty 2

## 2022-01-07 MED ORDER — SODIUM CHLORIDE 0.9 % IR SOLN
Status: DC | PRN
Start: 2022-01-07 — End: 2022-01-07
  Administered 2022-01-07: 3000 mL

## 2022-01-07 MED ORDER — KETAMINE HCL 10 MG/ML IJ SOLN
INTRAMUSCULAR | Status: DC | PRN
  Administered 2022-01-07: 50 mg via INTRAVENOUS

## 2022-01-07 MED ORDER — OXYCODONE HCL 5 MG PO TABS: 5.0000 mg | ORAL_TABLET | ORAL | Status: DC | PRN

## 2022-01-07 MED ORDER — POLYETHYLENE GLYCOL 3350 17 G PO PACK: 17.0000 g | PACK | Freq: Every day | ORAL | Status: DC | PRN

## 2022-01-07 MED ORDER — ONDANSETRON HCL 4 MG/2ML IJ SOLN
INTRAMUSCULAR | Status: AC
  Filled 2022-01-07: qty 2

## 2022-01-07 MED ORDER — DEXAMETHASONE SODIUM PHOSPHATE 10 MG/ML IJ SOLN
INTRAMUSCULAR | Status: DC | PRN
Start: 2022-01-07 — End: 2022-01-07
  Administered 2022-01-07: 4 mg via INTRAVENOUS

## 2022-01-07 MED ORDER — METHOCARBAMOL 500 MG IVPB - SIMPLE MED
500.0000 mg | Freq: Four times a day (QID) | INTRAVENOUS | Status: DC | PRN
  Filled 2022-01-07: qty 50

## 2022-01-07 MED ORDER — VANCOMYCIN HCL 750 MG/150ML IV SOLN
750.0000 mg | Freq: Two times a day (BID) | INTRAVENOUS | Status: DC
  Administered 2022-01-08 – 2022-01-09 (×4): 750 mg via INTRAVENOUS
  Filled 2022-01-07 (×4): qty 150

## 2022-01-07 MED ORDER — VANCOMYCIN HCL 1000 MG IV SOLR
INTRAVENOUS | Status: DC | PRN
  Administered 2022-01-07: 1000 mg via INTRAVENOUS

## 2022-01-07 MED ORDER — PHENYLEPHRINE 80 MCG/ML (10ML) SYRINGE FOR IV PUSH (FOR BLOOD PRESSURE SUPPORT)
PREFILLED_SYRINGE | INTRAVENOUS | Status: DC | PRN
  Administered 2022-01-07: 80 ug via INTRAVENOUS
  Administered 2022-01-07: 160 ug via INTRAVENOUS
  Administered 2022-01-07: 80 ug via INTRAVENOUS
  Administered 2022-01-07 (×2): 160 ug via INTRAVENOUS

## 2022-01-07 MED ORDER — LACTATED RINGERS IV SOLN
INTRAVENOUS | Status: DC
Start: 2022-01-07 — End: 2022-01-07

## 2022-01-07 MED ORDER — HYDROMORPHONE HCL 1 MG/ML IJ SOLN: 0.5000 mg | INTRAMUSCULAR | Status: DC | PRN

## 2022-01-07 MED ORDER — DOCUSATE SODIUM 100 MG PO CAPS
100.0000 mg | ORAL_CAPSULE | Freq: Two times a day (BID) | ORAL | Status: DC
  Filled 2022-01-07 (×3): qty 1

## 2022-01-07 MED ORDER — PROPOFOL 10 MG/ML IV BOLUS
INTRAVENOUS | Status: AC
Start: 2022-01-07 — End: ?
  Filled 2022-01-07: qty 20

## 2022-01-07 MED ORDER — FLEET ENEMA 7-19 GM/118ML RE ENEM: 1.0000 | ENEMA | Freq: Once | RECTAL | Status: DC | PRN

## 2022-01-07 MED ORDER — LIDOCAINE 2% (20 MG/ML) 5 ML SYRINGE
INTRAMUSCULAR | Status: DC | PRN
  Administered 2022-01-07: 100 mg via INTRAVENOUS

## 2022-01-07 MED ORDER — VANCOMYCIN HCL IN DEXTROSE 1-5 GM/200ML-% IV SOLN
INTRAVENOUS | Status: AC
  Filled 2022-01-07: qty 200

## 2022-01-07 SURGICAL SUPPLY — 51 items
BAG COUNTER SPONGE SURGICOUNT (BAG) ×1 IMPLANT
BAG SPEC THK2 15X12 ZIP CLS (MISCELLANEOUS) ×1
BAG SPNG CNTER NS LX DISP (BAG) ×1
BAG ZIPLOCK 12X15 (MISCELLANEOUS) ×2 IMPLANT
BANDAGE ESMARK 6X9 LF (GAUZE/BANDAGES/DRESSINGS) ×1 IMPLANT
BLADE SAW SGTL 11.0X1.19X90.0M (BLADE) IMPLANT
BNDG CMPR 9X6 STRL LF SNTH (GAUZE/BANDAGES/DRESSINGS) ×1
BNDG ELASTIC 3X5.8 VLCR STR LF (GAUZE/BANDAGES/DRESSINGS) ×1 IMPLANT
BNDG ESMARK 6X9 LF (GAUZE/BANDAGES/DRESSINGS) ×2
BNDG GAUZE ELAST 4 BULKY (GAUZE/BANDAGES/DRESSINGS) ×2 IMPLANT
CNTNR URN SCR LID CUP LEK RST (MISCELLANEOUS) IMPLANT
CONT SPEC 4OZ STRL OR WHT (MISCELLANEOUS) ×2
COVER SURGICAL LIGHT HANDLE (MISCELLANEOUS) ×2 IMPLANT
CUFF TOURN SGL QUICK 18X4 (TOURNIQUET CUFF) ×2 IMPLANT
CUFF TOURN SGL QUICK 24 (TOURNIQUET CUFF) ×2
CUFF TOURN SGL QUICK 34 (TOURNIQUET CUFF) ×2
CUFF TRNQT CYL 24X4X16.5-23 (TOURNIQUET CUFF) ×1 IMPLANT
CUFF TRNQT CYL 34X4.125X (TOURNIQUET CUFF) ×1 IMPLANT
DRAIN PENROSE 0.5X18 (DRAIN) ×2 IMPLANT
DRSG PAD ABDOMINAL 8X10 ST (GAUZE/BANDAGES/DRESSINGS) ×2 IMPLANT
DURAPREP 26ML APPLICATOR (WOUND CARE) ×2 IMPLANT
ELECT REM PT RETURN 15FT ADLT (MISCELLANEOUS) ×2 IMPLANT
GAUZE PACKING IODOFORM 1/2INX (GAUZE/BANDAGES/DRESSINGS) IMPLANT
GAUZE PACKING IODOFORM 1/2INX5 (GAUZE/BANDAGES/DRESSINGS) ×1
GAUZE SPONGE 4X4 12PLY STRL (GAUZE/BANDAGES/DRESSINGS) ×3 IMPLANT
GLOVE BIOGEL M 7.0 STRL (GLOVE) ×1 IMPLANT
GLOVE BIOGEL PI IND STRL 7.5 (GLOVE) ×3 IMPLANT
GLOVE BIOGEL PI IND STRL 8.5 (GLOVE) ×1 IMPLANT
GLOVE BIOGEL PI INDICATOR 7.5 (GLOVE) ×3
GLOVE BIOGEL PI INDICATOR 8.5 (GLOVE) ×1
GLOVE ECLIPSE 8.0 STRL XLNG CF (GLOVE) ×2 IMPLANT
GLOVE INDICATOR 6.5 STRL GRN (GLOVE) ×2 IMPLANT
GLOVE SURG ORTHO 8.0 STRL STRW (GLOVE) ×2 IMPLANT
GOWN STRL REUS W/ TWL LRG LVL3 (GOWN DISPOSABLE) ×2 IMPLANT
GOWN STRL REUS W/TWL LRG LVL3 (GOWN DISPOSABLE) ×4
HANDPIECE INTERPULSE COAX TIP (DISPOSABLE) ×2
KIT BASIN OR (CUSTOM PROCEDURE TRAY) ×2 IMPLANT
KIT TURNOVER KIT A (KITS) ×1 IMPLANT
MANIFOLD NEPTUNE II (INSTRUMENTS) ×2 IMPLANT
PACK ORTHO EXTREMITY (CUSTOM PROCEDURE TRAY) ×2 IMPLANT
PAD CAST 4YDX4 CTTN HI CHSV (CAST SUPPLIES) ×1 IMPLANT
PADDING CAST COTTON 4X4 STRL (CAST SUPPLIES) ×2
PADDING CAST SYNTHETIC 4 (CAST SUPPLIES) ×1
PADDING CAST SYNTHETIC 4X4 STR (CAST SUPPLIES) IMPLANT
PROTECTOR NERVE ULNAR (MISCELLANEOUS) ×2 IMPLANT
SET HNDPC FAN SPRY TIP SCT (DISPOSABLE) ×1 IMPLANT
SOL SCRUB PVP POV-IOD 4OZ 7.5% (MISCELLANEOUS) ×2
SOLUTION SCRB POV-IOD 4OZ 7.5% (MISCELLANEOUS) ×1 IMPLANT
SYR CONTROL 10ML LL (SYRINGE) ×2 IMPLANT
TOWEL OR 17X26 10 PK STRL BLUE (TOWEL DISPOSABLE) ×4 IMPLANT
TUBING CONNECTING 10 (TUBING) ×1 IMPLANT

## 2022-01-07 NOTE — Progress Notes (Signed)
  Transition of Care [facility]) Screening Note   Patient Details  Name: Jared Murray Date of Birth: May 01, 1976   Transition of Care [facility]) CM/SW Contact:    Lennart Pall, LCSW Phone Number: 01/07/2022, 9:28 AM    Transition of Care Department [facility]) has reviewed patient and no TOC needs have been identified at this time. We will continue to monitor patient advancement through interdisciplinary progression rounds. If new patient transition needs arise, please place a TOC consult.

## 2022-01-07 NOTE — Anesthesia Postprocedure Evaluation (Signed)
Anesthesia Post Note  Patient: Jared Murray  Procedure(s) Performed: IRRIGATION AND DEBRIDEMENT OF LEFT HAND (Left)     Patient location during evaluation: PACU Anesthesia Type: General Level of consciousness: awake Pain management: pain level controlled Vital Signs Assessment: post-procedure vital signs reviewed and stable Respiratory status: spontaneous breathing Cardiovascular status: stable Postop Assessment: no apparent nausea or vomiting Anesthetic complications: no   No notable events documented.  Last Vitals:  Vitals:   01/07/22 1500 01/07/22 1515  BP: (!) 141/92 (!) 140/97  Pulse:  98  Resp: 15 20  Temp:    SpO2: 100% 100%    Last Pain:  Vitals:   01/07/22 1515  TempSrc:   PainSc: 0-No pain                 Huston Foley

## 2022-01-07 NOTE — Progress Notes (Signed)
Pharmacy Antibiotic Note  Jared Murray is a 46 y.o. male admitted on 01/03/2022 with cellulitis, left hand abscess.  S/p I&D on 5/23.  Pharmacy has been consulted for Unasyn and Vancomycin dosing.  Plan: Unasyn 3g IV q6h Vancomycin 1g preop, then 750 mg IV q12h.  (SCr 0.94, TBW < IBW, Vd 0.72, est AUC 449.7) Measure Vanc levels as needed.  Goal AUC = 400 - 550. Follow up renal function, culture results, and clinical course.   Height: '5\' 10"'$  (177.8 cm) Weight: 61.2 kg (135 lb) IBW/kg (Calculated) : 73  Temp (24hrs), Avg:98.6 F (37 C), Min:97.8 F (36.6 C), Max:99.7 F (37.6 C)  Recent Labs  Lab 01/03/22 2214 01/04/22 0255 01/04/22 0908 01/05/22 0301 01/06/22 0322 01/07/22 0301  WBC 6.0  --   --  8.8 5.5 3.5*  CREATININE 1.15 1.10  --   --  0.85 0.94  LATICACIDVEN  --  1.4 0.9  --   --   --     Estimated Creatinine Clearance: 85.9 mL/min (by C-G formula based on SCr of 0.94 mg/dL).    Allergies  Allergen Reactions   Bee Venom Swelling   Latex Itching and Rash   Penicillins Other (See Comments)    Childhood allergy Has patient had a PCN reaction causing immediate rash, facial/tongue/throat swelling, SOB or lightheadedness with hypotension: NO Has patient had a PCN reaction causing severe rash involving mucus membranes or skin necrosis:NO Has patient had a PCN reaction that required hospitalization NO Has patient had a PCN reaction occurring within the last 10 years: NO If all of the above answers are "NO", then may proceed with Cephalosporin use.    Antimicrobials this admission: 5/20 Ceftriaxone >> 5/23 5/23 Vancomycin >> 5/23 Unasyn >>   Dose adjustments this admission:   Microbiology results: 5/20 BCx: ngtd 5/20 UCx: < 10k colonies, insignificant growth 5/22 MRSA PCR: staph aureus positive 5/23 Tissue culture (I&D):     Thank you for allowing pharmacy to be a part of this patient's care.  Gretta Arab PharmD, BCPS Clinical Pharmacist [facility] main  pharmacy [phone] 01/07/2022 4:13 PM

## 2022-01-07 NOTE — Progress Notes (Signed)
Inpatient Diabetes Program Recommendations  AACE/ADA: New Consensus Statement on Inpatient Glycemic Control (2015)  Target Ranges:  Prepandial:   less than 140 mg/dL      Peak postprandial:   less than 180 mg/dL (1-2 hours)      Critically ill patients:  140 - 180 mg/dL   Lab Results  Component Value Date   GLUCAP 275 (H) 01/07/2022   HGBA1C 11.0 (A) 12/19/2021    Review of Glycemic Control  Spoke with pt about his diabetes control and HgbA1C of 11.0%. Pt states he refused his Semglee last night because he had low of 43 yesterday morning. States he previously was going to the [facility] and then switched to [facility] here in [place]. Said the pharmacist at [facility] was trying to get referral for an Endo. At present, pt sees [facility] for his PCP.  Home meds: Basaglar 15 units QHS Novolog 2-4 units TID with meals.  States if blood sugars good, he doesn't take the Novolog.  Discussed hypoglycemia s/s and treatment.  Pt states his wife has started cooking healthier and he's really trying to make good choices with his food selections. Has not exercised recently, although said he looks forward to walking and lifting weights again. Stressed importance of monitoring blood sugars at least 3x/day and also check 2 hours post-prandial sometimes. Discussed HgbA1C and importance of getting down to around 7% , to reduce risk of both long and short-term complications.   Will continue to follow glucose trends after surgery this morning.   Inpatient Diabetes Program Recommendations:    Continue with Semglee 15 units QHS Continue Novolog 0-9 units TID with meals. When eating well, will likely need meal coverage insulin - Novolog 3 units TID if eating > 50%.  Thank you. Lorenda Peck, RD, LDN, CDE Inpatient Diabetes Coordinator [phone]

## 2022-01-07 NOTE — Transfer of Care (Signed)
Immediate Anesthesia Transfer of Care Note  Patient: Jared Murray  Procedure(s) Performed: IRRIGATION AND DEBRIDEMENT OF LEFT HAND (Left)  Patient Location: PACU  Anesthesia Type:General  Level of Consciousness: awake, alert  and patient cooperative  Airway & Oxygen Therapy: Patient Spontanous Breathing and Patient connected to face mask oxygen  Post-op Assessment: Report given to RN and Post -op Vital signs reviewed and stable  Post vital signs: Reviewed and stable  Last Vitals:  Vitals Value Taken Time  BP 120/93 01/07/22 1445  Temp    Pulse 95 01/07/22 1447  Resp 26 01/07/22 1447  SpO2 100 % 01/07/22 1447  Vitals shown include unvalidated device data.  Last Pain:  Vitals:   01/07/22 1128  TempSrc: Oral  PainSc:       Patients Stated Pain Goal: 5 (77/37/36 6815)  Complications: No notable events documented.

## 2022-01-07 NOTE — Progress Notes (Signed)
Inpatient Diabetes Program Recommendations  AACE/ADA: New Consensus Statement on Inpatient Glycemic Control (2015)  Target Ranges:  Prepandial:   less than 140 mg/dL      Peak postprandial:   less than 180 mg/dL (1-2 hours)      Critically ill patients:  140 - 180 mg/dL   Lab Results  Component Value Date   GLUCAP 351 (H) 01/07/2022   HGBA1C 11.0 (A) 12/19/2021    Review of Glycemic Control  Diabetes history: DM2 Outpatient Diabetes medications: Basaglar 15 units QHS, Novolog 4 units TID, 6 units TID if CBG > 350 Current orders for Inpatient glycemic control: Lantus 15 units QHS, Novolog 0-9 units TID with meals  HgbA1C - 11.0%  Inpatient Diabetes Program Recommendations:    Increase Semglee to 18 units QHS Increase Novolog to 0-15 units Q4H  Will speak with pt about his glucose control and HgbA1C today.  Continue to follow trends.  Thank you. Lorenda Peck, RD, LDN, CDE Inpatient Diabetes Coordinator [phone]

## 2022-01-07 NOTE — Anesthesia Procedure Notes (Signed)
Procedure Name: LMA Insertion Date/Time: 01/07/2022 1:53 PM Performed by: Eben Burow, CRNA Pre-anesthesia Checklist: Patient identified, Emergency Drugs available, Suction available, Patient being monitored and Timeout performed Patient Re-evaluated:Patient Re-evaluated prior to induction Oxygen Delivery Method: Circle system utilized Preoxygenation: Pre-oxygenation with 100% oxygen Induction Type: IV induction Ventilation: Mask ventilation without difficulty LMA: LMA inserted and LMA with gastric port inserted LMA Size: 5.0 Number of attempts: 3 Tube secured with: Tape Dental Injury: Teeth and Oropharynx as per pre-operative assessment  Comments: LMA #4 placed with large leak replaced by #4 gastric port with large leak, #5 gastric port fit well with Larine Fielding TVs.

## 2022-01-07 NOTE — Progress Notes (Signed)
Patient examined, still with purulent drainage coming from the dressing.  Finger extension seems to be intact and sensation intact throughout.  Plan for surgical debridement of the left hand, may require multiple debridements depending on findings.  The risks benefits and alternatives were discussed with the patient including but not limited to the risks of nonoperative treatment, versus surgical intervention including infection, bleeding, nerve injury, the need for revision surgery, progression to osteomyelitis, joint infection, loss of limb, blood clots, cardiopulmonary complications, morbidity, mortality, among others, and they were willing to proceed.    Marchia Bond, MD

## 2022-01-07 NOTE — Anesthesia Preprocedure Evaluation (Signed)
Anesthesia Evaluation  Patient identified by MRN, date of birth, ID band Patient awake    Reviewed: Allergy & Precautions, NPO status , Patient's Chart, lab work & pertinent test results  Airway Mallampati: II  TM Distance: >3 FB Neck ROM: Full    Dental  (+) Dental Advisory Given   Pulmonary former smoker,    breath sounds clear to auscultation       Cardiovascular + dysrhythmias  Rhythm:Regular Rate:Normal     Neuro/Psych negative neurological ROS     GI/Hepatic GERD  ,  Endo/Other  diabetes, Type 1, Insulin Dependent  Renal/GU Renal disease     Musculoskeletal   Abdominal   Peds  Hematology  (+) Blood dyscrasia, anemia ,   Anesthesia Other Findings   Reproductive/Obstetrics                             Anesthesia Physical Anesthesia Plan  ASA: 2  Anesthesia Plan: General   Post-op Pain Management: Tylenol PO (pre-op)* and Toradol IV (intra-op)*   Induction:   PONV Risk Score and Plan: 2 and Dexamethasone, Ondansetron and Treatment may vary due to age or medical condition  Airway Management Planned: LMA  Additional Equipment:   Intra-op Plan:   Post-operative Plan: Extubation in OR  Informed Consent: I have reviewed the patients History and Physical, chart, labs and discussed the procedure including the risks, benefits and alternatives for the proposed anesthesia with the patient or authorized representative who has indicated his/her understanding and acceptance.     Dental advisory given  Plan Discussed with:   Anesthesia Plan Comments:         Anesthesia Quick Evaluation

## 2022-01-07 NOTE — Op Note (Signed)
01/07/2022  2:28 PM  PATIENT:  Jared Murray    PRE-OPERATIVE DIAGNOSIS: Left hand abscess between the first and second fingers in the webspace  POST-OPERATIVE DIAGNOSIS:  Same  PROCEDURE: Excisional debridement, left hand, skin, subcutaneous tissue, fascia, deep abscess  SURGEON:  Johnny Bridge, MD  PHYSICIAN ASSISTANT: Merlene Pulling, PA-C, present and scrubbed throughout the case, critical for completion in a timely fashion, and for retraction, instrumentation.  ANESTHESIA:   General  PREOPERATIVE INDICATIONS:  [name] is a  46 y.o. male with a diagnosis of Left hand cellulitis who failed conservative measures and elected for surgical management.    The risks benefits and alternatives were discussed with the patient preoperatively including but not limited to the risks of infection, bleeding, nerve injury, cardiopulmonary complications, the need for revision surgery, among others, and the patient was willing to proceed.  We also discussed the risks for persistent infection, loss of function, destruction of the joint, bone, and tendons.  ESTIMATED BLOOD LOSS: Minimal  OPERATIVE IMPLANTS: None except for iodoform gauze packed at the completion of the procedure  OPERATIVE FINDINGS: Substantial purulence that was tracking into the webspace although did not appear to violate the joint, but it was on the tendon sheath, but did not appear to go into the bone.  OPERATIVE PROCEDURE: The patient was brought to the operating room and placed in supine position.  General anesthesia was administered.  The left upper extremity was prepped and draped in usual sterile fashion.  Timeout performed.  Excisional debridement carried out, and I did elevate the hand and inflated a tourniquet.  I excised necrotic purulent material, and sent this for Gram stain culture and sensitivity.  I explored the wound, extended the wound size by about 0.5 cm proximally and distally, and once I was satisfied  that I had removed all of the necrotic purulent material I irrigated the wounds copiously using 3 L of saline.  The wounds the tourniquet was dropped, appropriate hemostasis obtained, and the wound packed with iodoform packing, with sterile gauze followed by a volar splint.  He was awakened and returned to the PACU in stable and satisfactory condition.  There were no complications and he tolerated the procedure well.  Debridement type: Excisional Debridement  Side: left  Body Location: Hand at the webspace between the index and long finger  Tools used for debridement: scalpel, scissors, curette, and rongeur  Pre-debridement Wound size (cm):   Length: 1.5 cm        width: 1.5 cm     depth: 0.5  Post-debridement Wound size (cm):   Length: 2.5        width: 2.5     depth: 0.5  Debridement depth beyond dead/damaged tissue down to healthy viable tissue: yes  Tissue layer involved: skin, subcutaneous tissue, muscle / fascia  Nature of tissue removed: Slough, Necrotic, Devitalized Tissue, Non-viable tissue, and Purulence  Irrigation volume: 3 L     Irrigation fluid type: Normal Saline

## 2022-01-07 NOTE — Progress Notes (Signed)
  Progress Note   Patient: Jared Murray SHF:[id number] DOB: 1976/07/27 DOA: 01/03/2022     2 DOS: the patient was seen and examined on 01/07/2022   Brief hospital course: 46 y.o. male with medical history significant of history of pericarditis, history of AKI, seasonal allergies, anxiety, depression and vascular necrosis of the hip, chest wall abscess, chronic diarrhea, type I DKA episodes, eczema, history of elevated liver enzymes, GERD hyperlipidemia, hyperbilirubinemia, hyperkalemia, hypokalemia, history of cannabis use, osteoarthritis, history of prolonged QT interval, history of sepsis, substance abuse, tobacco use disorder who is coming to the emergency department due to generalized malaise, fatigue, chills, night sweats, low-grade fevers since Monday and progressively worse left hand erythema, edema and tenderness since Sunday.  No history of trauma.  No sore throat, rhinorrhea, dyspnea, wheezing or hemoptysis.  No chest pain, palpitations, diaphoresis, PND, orthopnea or pitting edema lower extremities.  He gets occasional diarrhea and was nauseous earlier this week.  No emesis, constipation, melena or hematochezia.  No flank pain, dysuria, frequency or hematuria.  No polyuria, polydipsia, polyphagia or blurred vision  Assessment and Plan: No notes have been filed under this hospital service. Service: Hospitalist Principal Problem:   Cellulitis of left hand Continued on ceftriaxone 1 g IVPB daily. Analgesics as needed. Controlled with current regimen Edema seems to be improving with current abx and with elevation Given very poorly controlled DM, consulted Orthopedic Surgery. Recs for Volar splint and sling elevator noted Orthopedic Surgery plans for debridement of L hand 5/23 Will repeat CBC in AM   Active Problems:   GERD (gastroesophageal reflux disease) Continue Protonix 40 mg p.o. daily     Type 1 diabetes mellitus with hyperglycemia (HCC) Continue Basaglar 15 units p.o. at  bedtime. CBG monitoring with RI SS. Pt admits to poor dietary discretion. Education provided by Dietitian this visit     HLD (hyperlipidemia) Resume atorvastatin 20 mg p.o. daily.     Normocytic anemia Monitor hematocrit and hemoglobin. Hgb trends remain stable Follow-up with primary care provider.  Hypokalemia -Corrected -repeat bmet in AM     Subjective: States swelling in L hand seems improved  Physical Exam: Vitals:   01/06/22 0605 01/06/22 2121 01/07/22 0509 01/07/22 1128  BP: 124/81 129/83 122/90 109/75  Pulse: 92 89 92 92  Resp: '16 18 18 15  '$ Temp: 98.3 F (36.8 C) 99.7 F (37.6 C) 98.8 F (37.1 C) 98.1 F (36.7 C)  TempSrc: Oral Oral Oral Oral  SpO2: 100% 100% 100% 99%  Weight:      Height:       General exam: Awake, laying in bed, in nad Respiratory system: Normal respiratory effort, no wheezing Cardiovascular system: regular rate, s1, s2 Gastrointestinal system: Soft, nondistended, positive BS Central nervous system: CN2-12 grossly intact, strength intact Extremities: Perfused, no clubbing, L hand and arm in spling Skin: Normal skin turgor, no notable skin lesions seen Psychiatry: Mood normal // no visual hallucinations   Data Reviewed:  Labs reviewed: WBC 3.5, Hgb 9.9  Family Communication: Pt in room, family currently not at bedside  Disposition: Status is: Inpatient Continue inpatient stay because: Severity of illness  Planned Discharge Destination: Home    Author: Marylu Lund, MD 01/07/2022 1:53 PM  For on call review [email or web address].

## 2022-01-08 ENCOUNTER — Encounter (HOSPITAL_COMMUNITY): Payer: Self-pay | Admitting: Orthopedic Surgery

## 2022-01-08 ENCOUNTER — Telehealth: Payer: Self-pay | Admitting: Family Medicine

## 2022-01-08 DIAGNOSIS — E440 Moderate protein-calorie malnutrition: Secondary | ICD-10-CM

## 2022-01-08 DIAGNOSIS — D649 Anemia, unspecified: Secondary | ICD-10-CM | POA: Diagnosis not present

## 2022-01-08 DIAGNOSIS — K219 Gastro-esophageal reflux disease without esophagitis: Secondary | ICD-10-CM

## 2022-01-08 DIAGNOSIS — L03114 Cellulitis of left upper limb: Secondary | ICD-10-CM | POA: Diagnosis not present

## 2022-01-08 DIAGNOSIS — E1065 Type 1 diabetes mellitus with hyperglycemia: Secondary | ICD-10-CM

## 2022-01-08 LAB — GLUCOSE, CAPILLARY
Glucose-Capillary: 377 mg/dL — ABNORMAL HIGH (ref 70–99)
Glucose-Capillary: 283 mg/dL — ABNORMAL HIGH (ref 70–99)
Glucose-Capillary: 250 mg/dL — ABNORMAL HIGH (ref 70–99)
Glucose-Capillary: 351 mg/dL — ABNORMAL HIGH (ref 70–99)

## 2022-01-08 LAB — COMPREHENSIVE METABOLIC PANEL
ALT: 20 U/L (ref 0–44)
AST: 24 U/L (ref 15–41)
Albumin: 2.6 g/dL — ABNORMAL LOW (ref 3.5–5.0)
Alkaline Phosphatase: 91 U/L (ref 38–126)
Anion gap: 10 (ref 5–15)
BUN: 17 mg/dL (ref 6–20)
CO2: 23 mmol/L (ref 22–32)
Calcium: 8.3 mg/dL — ABNORMAL LOW (ref 8.9–10.3)
Chloride: 95 mmol/L — ABNORMAL LOW (ref 98–111)
Creatinine, Ser: 1.14 mg/dL (ref 0.61–1.24)
GFR, Estimated: >60 mL/min (ref >60)
Glucose, Bld: 431 mg/dL — ABNORMAL HIGH (ref 70–99)
Potassium: 4.9 mmol/L (ref 3.5–5.1)
Sodium: 128 mmol/L — ABNORMAL LOW (ref 135–145)
Total Bilirubin: 0.8 mg/dL (ref 0.3–1.2)
Total Protein: 5.7 g/dL — ABNORMAL LOW (ref 6.5–8.1)

## 2022-01-08 LAB — CBC
HCT: 29.9 % — ABNORMAL LOW (ref 39.0–52.0)
Hemoglobin: 9.8 g/dL — ABNORMAL LOW (ref 13.0–17.0)
MCH: 27.6 pg (ref 26.0–34.0)
MCHC: 32.8 g/dL (ref 30.0–36.0)
MCV: 84.2 fL (ref 80.0–100.0)
Platelets: 387 10*3/uL (ref 150–400)
RBC: 3.55 MIL/uL — ABNORMAL LOW (ref 4.22–5.81)
RDW: 13.1 % (ref 11.5–15.5)
WBC: 5.1 10*3/uL (ref 4.0–10.5)
nRBC: 0 % (ref 0.0–0.2)

## 2022-01-08 NOTE — Progress Notes (Signed)
  Progress Note   Patient: Jared Murray TAV:[id number] DOB: [birth year] DOA: 01/03/2022     3 DOS: the patient was seen and examined on 01/08/2022   Brief hospital course: 46 y.o. male with medical history significant of history of pericarditis, history of AKI, seasonal allergies, anxiety, depression and vascular necrosis of the hip, chest wall abscess, chronic diarrhea, type I DKA episodes, eczema, history of elevated liver enzymes, GERD hyperlipidemia, hyperbilirubinemia, hyperkalemia, hypokalemia, history of cannabis use, osteoarthritis, history of prolonged QT interval, history of sepsis, substance abuse, tobacco use disorder who is coming to the emergency department due to generalized malaise, fatigue, chills, night sweats, low-grade fevers since Monday and progressively worse left hand erythema, edema and tenderness since Sunday.  No history of trauma.  No sore throat, rhinorrhea, dyspnea, wheezing or hemoptysis.  No chest pain, palpitations, diaphoresis, PND, orthopnea or pitting edema lower extremities.  He gets occasional diarrhea and was nauseous earlier this week.  No emesis, constipation, melena or hematochezia.  No flank pain, dysuria, frequency or hematuria.  No polyuria, polydipsia, polyphagia or blurred vision  Assessment and Plan:  Principal Problem:   Cellulitis of left hand Initially on ceftriaxone, now on vancomycin/Unasyn Seen by orthopedics and underwent excisional debridement on 5/23 Further postop care per orthopedics Will repeat CBC in AM   Active Problems:   GERD (gastroesophageal reflux disease) Continue Protonix 40 mg p.o. daily     Type 1 diabetes mellitus with hyperglycemia (HCC) Continue Basaglar 15 units p.o. at bedtime. CBG monitoring with RI SS. Pt admits to poor dietary discretion. Education provided by Dietitian this visit     HLD (hyperlipidemia) Resume atorvastatin 20 mg p.o. daily.     Normocytic anemia Monitor hematocrit and hemoglobin. Hgb  trends remain stable Follow-up with primary care provider.  Hypokalemia -Corrected -repeat bmet in AM  Hyponatremia -Pseudohyponatremia in the setting of hyperglycemia -Continue to monitor     Subjective: Feels.  Swelling in his left hand is better.  More mobility in his fingers.  Says the pain is doing better.  Physical Exam: Vitals:   01/08/22 0314 01/08/22 0505 01/08/22 0926 01/08/22 1332  BP: (!) 139/97 124/85 114/87 101/69  Pulse: 82 91 88 88  Resp: '17 18 17 17  '$ Temp: 98.3 F (36.8 C) 97.7 F (36.5 C) (!) 97.4 F (36.3 C) 98.5 F (36.9 C)  TempSrc: Oral Oral Oral Oral  SpO2: 100% 100% 100% 99%  Weight:      Height:       General exam: Awake, laying in bed, in nad Respiratory system: Normal respiratory effort, no wheezing Cardiovascular system: regular rate, s1, s2 Gastrointestinal system: Soft, nondistended, positive BS Central nervous system: CN2-12 grossly intact, strength intact Extremities: Perfused, no clubbing, L hand and arm in spling Skin: Normal skin turgor, no notable skin lesions seen Psychiatry: Mood normal // no visual hallucinations   Data Reviewed:  Labs reviewed: WBC 5.1, Hgb 9.8, Sodium 128  Family Communication: Pt in room, family currently not at bedside  Disposition: Status is: Inpatient Continue inpatient stay because: Severity of illness  Planned Discharge Destination: Home    Author: Kathie Dike, MD 01/08/2022 5:38 PM  For on call review [email or web address].

## 2022-01-08 NOTE — Telephone Encounter (Signed)
Copied from [place] [id number]. Topic: General - Other >> Jan 08, 2022  9:48 AM Erick Blinks wrote: Reason for CRM: Pt called wanting to speak to [facility] the pharmacist. He has questions about his refills for [facility] and also says Lurena Joiner was referring him to a neurologist at [facility].   Best contact: [phone]

## 2022-01-08 NOTE — Care Management Important Message (Signed)
Important Message  Patient Details IM Letter given to the Patient. Name: Jared Murray MRN: [id number] Date of Birth: [birth year]   Medicare Important Message Given:  Yes     Kerin Salen 01/08/2022, 9:51 AM

## 2022-01-08 NOTE — Evaluation (Signed)
Occupational Therapy Evaluation Patient Details Name: Jared Murray MRN: [id number] DOB: [birth year] Today's Date: 01/08/2022   History of Present Illness 46 y.o. male with medical history significant of history of pericarditis, history of AKI, seasonal allergies, anxiety, depression and vascular necrosis of the hip, chest wall abscess, chronic diarrhea, type I DKA episodes, eczema, history of elevated liver enzymes, GERD hyperlipidemia, hyperbilirubinemia, hyperkalemia, hypokalemia, history of cannabis use, osteoarthritis, history of prolonged QT interval, history of sepsis, substance abuse, tobacco use disorder who is coming to the emergency department due to generalized malaise, fatigue, chills, night sweats, low-grade fevers since Monday and progressively worse left hand erythema, edema and tenderness since Sunday. Laura admitted for cellulitis and underwent I & D of left hand 5/23.   Clinical Impression   [name] is a 46 year old man s/p I & D of left hand. On evaluation he reports independence with ambulation, toileting and other ADLs. He is able to use his hand as an assist to open containers and condiments. He exhibits partial finger ROM but good active movement - ROM limited by bulkiness of splint. Therapist reiterated NWB status and needing to maintain cleanliness of hand finger ROM. Patient verbalizes understanding. Patient has no further OT needs.    Recommendations for follow up therapy are one component of a multi-disciplinary discharge planning process, led by the attending physician.  Recommendations may be updated based on patient status, additional functional criteria and insurance authorization.   Follow Up Recommendations  No OT follow up    Assistance Recommended at Discharge PRN  Patient can return home with the following Assistance with cooking/housework    Functional Status Assessment  Patient has not had a recent decline in their functional status   Equipment Recommendations  None recommended by OT    Recommendations for Other Services       Precautions / Restrictions Precautions Required Braces or Orthoses: Splint/Cast Splint/Cast: volar splint - L forearm Splint/Cast - Date Prophylactic Dressing Applied (if applicable): 69/67/89 Restrictions Weight Bearing Restrictions: Yes LUE Weight Bearing: Non weight bearing      Mobility Bed Mobility Overal bed mobility: Independent                  Transfers Overall transfer level: Independent                        Balance Overall balance assessment: No apparent balance deficits (not formally assessed)                                         ADL either performed or assessed with clinical judgement   ADL Overall ADL's : Modified independent                                       General ADL Comments: Using left hand as able as an assist. Understands NWB status. Right hand dominant.     Vision Patient Visual Report: No change from baseline       Perception     Praxis      Pertinent Vitals/Pain Pain Assessment Pain Assessment: No/denies pain     Hand Dominance     Extremity/Trunk Assessment Upper Extremity Assessment Upper Extremity Assessment: LUE deficits/detail;RUE deficits/detail RUE Deficits / Details: WFL ROM and strength RUE  Coordination: WNL LUE Deficits / Details: WFL ROM of elbow and shoulder, forearm in volar splint. Able to partially range fingers - limited by splint and bulkiness of dressing.   Lower Extremity Assessment Lower Extremity Assessment: Overall WFL for tasks assessed       Communication     Cognition Arousal/Alertness: Awake/alert Behavior During Therapy: WFL for tasks assessed/performed Overall Cognitive Status: Within Functional Limits for tasks assessed                                       General Comments       Exercises     Shoulder Instructions       Home Living Family/patient expects to be discharged to:: Private residence Living Arrangements: Spouse/significant other;Children Available Help at Discharge: Family                                    Prior Functioning/Environment                          OT Problem List: Impaired UE functional use;Decreased coordination      OT Treatment/Interventions:      OT Goals(Current goals can be found in the care plan section) Acute Rehab OT Goals OT Goal Formulation: All assessment and education complete, DC therapy  OT Frequency:      Co-evaluation              AM-PAC OT "6 Clicks" Daily Activity     Outcome Measure Help from another person eating meals?: None Help from another person taking care of personal grooming?: None Help from another person toileting, which includes using toliet, bedpan, or urinal?: None Help from another person bathing (including washing, rinsing, drying)?: None Help from another person to put on and taking off regular upper body clothing?: None Help from another person to put on and taking off regular lower body clothing?: None 6 Click Score: 24   End of Session Nurse Communication:  (okay to see)  Activity Tolerance: Patient tolerated treatment well Patient left: in bed  OT Visit Diagnosis: Pain                Time: 0957-1005 OT Time Calculation (min): 8 min Charges:  OT General Charges $OT Visit: 1 Visit OT Evaluation $OT Eval Low Complexity: 1 Low  Tandrea Kommer, OTR/L [place]  Office [phone] Pager: [phone]   Lenward Chancellor 01/08/2022, 11:36 AM

## 2022-01-08 NOTE — Progress Notes (Signed)
Inpatient Diabetes Program Recommendations  AACE/ADA: New Consensus Statement on Inpatient Glycemic Control (2015)  Target Ranges:  Prepandial:   less than 140 mg/dL      Peak postprandial:   less than 180 mg/dL (1-2 hours)      Critically ill patients:  140 - 180 mg/dL   Lab Results  Component Value Date   GLUCAP 377 (H) 01/08/2022   HGBA1C 11.0 (A) 12/19/2021    Review of Glycemic Control  Diabetes history: DM2 Outpatient Diabetes medications: Basaglar 15 QHS, Novolog 4 TID, 6 TID if CBG > 350 Current orders for Inpatient glycemic control: Semglee 15 QHS, Novolog 0-9 units TID with meals  HgbA1C - 11.0%  Inpatient Diabetes Program Recommendations:    Increase Semglee to 20 units QHS  Add Novolog meal coverage - 5 units TID if eating > 50% meal  Needs tighter glycemic control for healing. Have spoken to pt at bedside regarding his HgbA1C and importance of improving his glucose control to prevent further complications.   Continue to follow.  Thank you. Lorenda Peck, RD, LDN, CDE Inpatient Diabetes Coordinator [phone]

## 2022-01-08 NOTE — Progress Notes (Signed)
Subjective: 1 Day Post-Op s/p Procedure(s): IRRIGATION AND DEBRIDEMENT OF LEFT HAND   Patient is alert, oriented.  Patient reports pain as well controlled. Feel likes hand pain has decreased and is better able to move his hand today. Denies chest pain, SOB, Calf pain. No nausea/vomiting. No other complaints.   Objective:  PE: VITALS:   Vitals:   01/07/22 1613 01/07/22 2036 01/08/22 0314 01/08/22 0505  BP: 105/66 (!) 107/53 (!) 139/97 124/85  Pulse: 95 95 82 91  Resp:  '17 17 18  '$ Temp:  97.8 F (36.6 C) 98.3 F (36.8 C) 97.7 F (36.5 C)  TempSrc:  Oral Oral Oral  SpO2: 100% 100% 100% 100%  Weight:      Height:       MSK: LUE with serous bloody drainage on dressing. No frank pus. Packing left in place. Able to flex, extend, and abduct all fingers, motion significantly improved from previous days, almost able to make a full fist today. Distal sensation intact. Cap refill intact.      LABS  Results for orders placed or performed during the hospital encounter of 01/03/22 (from the past 24 hour(s))  Glucose, capillary     Status: Abnormal   Collection Time: 01/07/22  7:36 AM  Result Value Ref Range   Glucose-Capillary 351 (H) 70 - 99 mg/dL  Glucose, capillary     Status: Abnormal   Collection Time: 01/07/22  9:58 AM  Result Value Ref Range   Glucose-Capillary 275 (H) 70 - 99 mg/dL  Glucose, capillary     Status: Abnormal   Collection Time: 01/07/22 11:26 AM  Result Value Ref Range   Glucose-Capillary 278 (H) 70 - 99 mg/dL  Aerobic/Anaerobic Culture w Gram Stain (surgical/deep wound)     Status: None (Preliminary result)   Collection Time: 01/07/22  2:32 PM   Specimen: PATH Soft tissue  Result Value Ref Range   Specimen Description      TISSUE Performed at [facility], [place] [address] [place], [place] [address]    Special Requests      NONE LEFT HAND, 1ST AND 2ND WEB SPACE Performed at [facility] [address],  [place], [place] [address]    Gram Stain      FEW WBC PRESENT, PREDOMINANTLY PMN FEW GRAM POSITIVE COCCI Performed at [facility], [place] [address] [place], [place] [address]    Culture PENDING    Report Status PENDING   Glucose, capillary     Status: Abnormal   Collection Time: 01/07/22  4:56 PM  Result Value Ref Range   Glucose-Capillary 375 (H) 70 - 99 mg/dL  Glucose, capillary     Status: Abnormal   Collection Time: 01/07/22  8:37 PM  Result Value Ref Range   Glucose-Capillary 358 (H) 70 - 99 mg/dL  Comprehensive metabolic panel     Status: Abnormal   Collection Time: 01/08/22  3:12 AM  Result Value Ref Range   Sodium 128 (L) 135 - 145 mmol/L   Potassium 4.9 3.5 - 5.1 mmol/L   Chloride 95 (L) 98 - 111 mmol/L   CO2 23 22 - 32 mmol/L   Glucose, Bld 431 (H) 70 - 99 mg/dL   BUN 17 6 - 20 mg/dL   Creatinine, Ser 1.14 0.61 - 1.24 mg/dL   Calcium 8.3 (L) 8.9 - 10.3 mg/dL   Total Protein 5.7 (L) 6.5 - 8.1 g/dL   Albumin 2.6 (L) 3.5 - 5.0  g/dL   AST 24 15 - 41 U/L   ALT 20 0 - 44 U/L   Alkaline Phosphatase 91 38 - 126 U/L   Total Bilirubin 0.8 0.3 - 1.2 mg/dL   GFR, Estimated >60 >60 mL/min   Anion gap 10 5 - 15  CBC     Status: Abnormal   Collection Time: 01/08/22  3:12 AM  Result Value Ref Range   WBC 5.1 4.0 - 10.5 K/uL   RBC 3.55 (L) 4.22 - 5.81 MIL/uL   Hemoglobin 9.8 (L) 13.0 - 17.0 g/dL   HCT 29.9 (L) 39.0 - 52.0 %   MCV 84.2 80.0 - 100.0 fL   MCH 27.6 26.0 - 34.0 pg   MCHC 32.8 30.0 - 36.0 g/dL   RDW 13.1 11.5 - 15.5 %   Platelets 387 150 - 400 K/uL   nRBC 0.0 0.0 - 0.2 %    No results found.  Assessment/Plan:  Cellulitis of left hand with Left hand abscess between the first and second fingers in the webspace  1 Day Post-Op s/p Procedure(s): IRRIGATION AND DEBRIDEMENT OF LEFT HAND  Weightbearing: NWB left hand, ok to WB through left elbow and shoulder.  Insicional and dressing care: daily dressing changes, I have redressed dressing this morning. Please  see picture above. I will discuss with Dr. Mardelle Matte when he would like packing removed ID: d/c'd Rocephin, started Vancomycin and Unasyn Pain control: continue current plan Follow - up plan:1 week with Dr. Mardelle Matte Dispo: possibly tomorrow depending on how his hand looks clinically tomorrow  Contact information:   Merlene Pulling, PA-C Weekdays 8-5  After hours and holidays please check [email or web address] for group call information for [facility]  Ventura Bruns 01/08/2022, 7:18 AM

## 2022-01-09 ENCOUNTER — Other Ambulatory Visit (HOSPITAL_COMMUNITY): Payer: Self-pay

## 2022-01-09 ENCOUNTER — Other Ambulatory Visit: Payer: Self-pay

## 2022-01-09 DIAGNOSIS — L03114 Cellulitis of left upper limb: Secondary | ICD-10-CM | POA: Diagnosis not present

## 2022-01-09 DIAGNOSIS — R509 Fever, unspecified: Secondary | ICD-10-CM

## 2022-01-09 DIAGNOSIS — E7800 Pure hypercholesterolemia, unspecified: Secondary | ICD-10-CM | POA: Diagnosis not present

## 2022-01-09 DIAGNOSIS — K219 Gastro-esophageal reflux disease without esophagitis: Secondary | ICD-10-CM | POA: Diagnosis not present

## 2022-01-09 LAB — GLUCOSE, CAPILLARY
Glucose-Capillary: 253 mg/dL — ABNORMAL HIGH (ref 70–99)
Glucose-Capillary: 180 mg/dL — ABNORMAL HIGH (ref 70–99)

## 2022-01-09 LAB — CULTURE, BLOOD (ROUTINE X 2)
Culture: NO GROWTH
Culture: NO GROWTH

## 2022-01-09 MED ORDER — AMOXICILLIN-POT CLAVULANATE 500-125 MG PO TABS
250.0000 mg | ORAL_TABLET | Freq: Three times a day (TID) | ORAL | 0 refills | Status: DC
  Filled 2022-01-09: qty 20, 14d supply, fill #0
  Filled 2022-01-09: qty 20, 10d supply, fill #0

## 2022-01-09 MED ORDER — INSULIN GLARGINE-YFGN 100 UNIT/ML ~~LOC~~ SOLN
20.0000 [IU] | Freq: Every day | SUBCUTANEOUS | Status: DC
  Filled 2022-01-09: qty 0.2

## 2022-01-09 MED ORDER — AMOXICILLIN-POT CLAVULANATE 875-125 MG PO TABS
1.0000 | ORAL_TABLET | Freq: Two times a day (BID) | ORAL | 0 refills | Status: DC
  Filled 2022-01-09: qty 20, 10d supply, fill #0

## 2022-01-09 MED ORDER — ACETAMINOPHEN 500 MG PO TABS
1000.0000 mg | ORAL_TABLET | Freq: Three times a day (TID) | ORAL | 0 refills | Status: DC
  Filled 2022-01-09 (×2): qty 60, 10d supply, fill #0

## 2022-01-09 MED ORDER — ACETAMINOPHEN 500 MG PO TABS
1000.0000 mg | ORAL_TABLET | Freq: Three times a day (TID) | ORAL | 0 refills | Status: AC
  Filled 2022-01-09: qty 60, 10d supply, fill #0

## 2022-01-09 MED ORDER — LOPERAMIDE HCL 2 MG PO CAPS
2.0000 mg | ORAL_CAPSULE | Freq: Once | ORAL | Status: AC
  Administered 2022-01-09: 2 mg via ORAL
  Filled 2022-01-09: qty 1

## 2022-01-09 MED ORDER — OXYCODONE HCL 5 MG PO TABS
5.0000 mg | ORAL_TABLET | Freq: Three times a day (TID) | ORAL | 0 refills | Status: DC | PRN
  Filled 2022-01-09: qty 20, 7d supply, fill #0

## 2022-01-09 MED ORDER — OXYCODONE HCL 5 MG PO TABS
5.0000 mg | ORAL_TABLET | Freq: Three times a day (TID) | ORAL | 0 refills | Status: AC | PRN
  Filled 2022-01-09: qty 20, 7d supply, fill #0

## 2022-01-09 NOTE — Progress Notes (Signed)
Patient discharged to home w/ family. Given all belongings, instructions, equipment, drsg materials. Patient verbalized understanding of all instructions. Sling in place. Verbalized dressing change procedure. Escorted to pov via w/c.

## 2022-01-09 NOTE — Progress Notes (Signed)
     Subjective: 2 Days Post-Op s/p Procedure(s): IRRIGATION AND DEBRIDEMENT OF LEFT HAND   Patient is alert, oriented. Patient has a little more pain this morning than he has other mornings.  Denies chest pain, SOB, Calf pain. No nausea/vomiting. No other complaints.   Objective:  PE: VITALS:   Vitals:   01/08/22 0926 01/08/22 1332 01/08/22 2137 01/09/22 0543  BP: 114/87 101/69 122/87 (!) 124/91  Pulse: 88 88 87 79  Resp: '17 17 18 18  '$ Temp: (!) 97.4 F (36.3 C) 98.5 F (36.9 C) 97.8 F (36.6 C) 98.2 F (36.8 C)  TempSrc: Oral Oral    SpO2: 100% 99% 99% 100%  Weight:      Height:       MSK: LUE with serous bloody drainage on dressing. Packing pulled with some pus seen on packing. Able to flex, extend, and abduct all fingers, motion significantly improved from previous days, almost able to make a full fist today. Distal sensation intact. Cap refill intact.        LABS  Results for orders placed or performed during the hospital encounter of 01/03/22 (from the past 24 hour(s))  Glucose, capillary     Status: Abnormal   Collection Time: 01/08/22  7:41 AM  Result Value Ref Range   Glucose-Capillary 377 (H) 70 - 99 mg/dL  Glucose, capillary     Status: Abnormal   Collection Time: 01/08/22 12:21 PM  Result Value Ref Range   Glucose-Capillary 283 (H) 70 - 99 mg/dL  Glucose, capillary     Status: Abnormal   Collection Time: 01/08/22  4:57 PM  Result Value Ref Range   Glucose-Capillary 250 (H) 70 - 99 mg/dL  Glucose, capillary     Status: Abnormal   Collection Time: 01/08/22  9:35 PM  Result Value Ref Range   Glucose-Capillary 351 (H) 70 - 99 mg/dL    No results found.  Assessment/Plan:  Cellulitis of left hand with Left hand abscess between the first and second fingers in the webspace  2 Days Post-Op s/p Procedure(s): IRRIGATION AND DEBRIDEMENT OF LEFT HAND  Weightbearing: NWB left hand, ok to WB through left elbow and shoulder.  Insicional and dressing care:  daily dressing changes, I have redressed dressing this morning. Please see picture above.  ID: Cultures came back Strep. Agalactiae.  d/c'd Rocephin, started Vancomycin and Unasyn post-op. Discussed oral antibiotics with pharmacy/ID pharmacy who recommending going home on oral Augmentin. Pain control: continue current plan Follow - up plan:1 week with Dr. Mardelle Matte Dispo: ok to discharge from ortho perspective when cleared by medicine. Instructions for dressing changes were discussed with patient this morning and added to discharge instructions.   Contact information:   Merlene Pulling, Hershal Coria [id number]  After hours and holidays please check [email or web address] for group call information for [facility]  Ventura Bruns 01/09/2022, 7:26 AM

## 2022-01-09 NOTE — Progress Notes (Addendum)
Latest Reference Range & Units 01/08/22 07:41 01/08/22 12:21 01/08/22 16:57 01/08/22 21:35 01/09/22 07:36  Glucose-Capillary 70 - 99 mg/dL 377 (H) 283 (H) 250 (H) 351 (H) 253 (H)  (H): Data is abnormally high  Noted that CBGs have been greater than 200 mg/dl.  Recommend increasing Semglee to 20 units at HS if blood sugars continue to be elevated.  Harvel Ricks RN BSN CDE Diabetes Coordinator Pager: [phone]  8am-5pm

## 2022-01-09 NOTE — Discharge Instructions (Addendum)
Diet: As you were doing prior to hospitalization   Shower:  May shower but keep the wounds dry, use an occlusive plastic wrap, NO SOAKING IN TUB.  If the bandage gets wet, change with a clean dry gauze.    Dressing:  Perform daily dressing changes. Take off ace wrap, remove plaster splint piece, take off gauze (can leave yellow xeroform in place) replace with no dry clean gauze (make sure you touch only the side of the gauze that will not be going on your wound), put the plaster splint back in place, and rewrap with ace wrap.   Activity:  Increase activity slowly as tolerated, but follow the weight bearing instructions below.  The rules on driving is that you can not be taking narcotics while you drive, and you must feel in control of the vehicle.    Weight Bearing: No bearing weight with left hand.   To prevent constipation: you may use a stool softener such as -  Colace (over the counter) 100 mg by mouth twice a day  Drink plenty of fluids (prune juice may be helpful) and high fiber foods Miralax (over the counter) for constipation as needed.    Itching:  If you experience itching with your medications, try taking only a single pain pill, or even half a pain pill at a time.  You may take up to 10 pain pills per day, and you can also use benadryl over the counter for itching or also to help with sleep.   Precautions:  If you experience chest pain or shortness of breath - call 911 immediately for transfer to the hospital emergency department!!  If you develop a fever greater that 101 F, purulent drainage from wound, increased redness or drainage from wound, or calf pain -- Call the office at [phone]                                                Follow- Up Appointment:  Please call for an appointment to be seen in 2 weeks [place] - [phone]

## 2022-01-09 NOTE — Discharge Summary (Signed)
Physician Discharge Summary  Jared Murray:[id number] DOB: 02-Feb-1976 DOA: 01/03/2022  PCP: Charlott Rakes, MD  Admit date: 01/03/2022 Discharge date: 01/09/2022  Admitted From: Home Disposition: Home  Recommendations for Outpatient Follow-up:  Follow up with PCP in 1-2 weeks Please obtain BMP/CBC in one week Follow-up with Dr. Mardelle Matte in 1 week Continue daily dressing changes NWB left hand, okay to bear weight through left elbow and shoulder Reports that he has already been referred to endocrinologist   Discharge Condition: Stable CODE STATUS: Full code Diet recommendation: Carb modified diet  Brief/Interim Summary: 46 y.o. male with medical history significant of history of pericarditis, history of AKI, seasonal allergies, anxiety, depression and vascular necrosis of the hip, chest wall abscess, chronic diarrhea, type I DKA episodes, eczema, history of elevated liver enzymes, GERD hyperlipidemia, hyperbilirubinemia, hyperkalemia, hypokalemia, history of cannabis use, osteoarthritis, history of prolonged QT interval, history of sepsis, substance abuse, tobacco use disorder who is coming to the emergency department due to generalized malaise, fatigue, chills, night sweats, low-grade fevers since Monday and progressively worse left hand erythema, edema and tenderness since Sunday.  No history of trauma.  No sore throat, rhinorrhea, dyspnea, wheezing or hemoptysis.  No chest pain, palpitations, diaphoresis, PND, orthopnea or pitting edema lower extremities.  He gets occasional diarrhea and was nauseous earlier this week.  No emesis, constipation, melena or hematochezia.  No flank pain, dysuria, frequency or hematuria.  No polyuria, polydipsia, polyphagia or blurred vision  Discharge Diagnoses:  Principal Problem:   Cellulitis of left hand Active Problems:   GERD (gastroesophageal reflux disease)   Type 1 diabetes mellitus with hyperglycemia (HCC)   HLD (hyperlipidemia)   Normocytic  anemia   Malnutrition of moderate degree  Cellulitis of left hand Initially on ceftriaxone, now on vancomycin/Unasyn Seen by orthopedics and underwent excisional debridement on 5/23 Operative cultures returned positive for strep. Agalactiae -He was subsequently transitioned to Augmentin to complete his course -He was taught by orthopedics how to perform daily dressing changes -He feels that him and his wife can manage this at home -We will follow-up with orthopedics in 1 week   Active Problems:   GERD (gastroesophageal reflux disease) Continue Protonix 40 mg p.o. daily     Type 1 diabetes mellitus with hyperglycemia (HCC) Continue Basaglar 15 units p.o. at bedtime. CBG monitoring with RI SS. Pt admits to poor dietary discretion. Education provided by Dietitian this visit -Last A1c noted to be 11 -Reports that he does have follow-up scheduled with endocrinology -Increased basal insulin to 18 units daily     HLD (hyperlipidemia) Resume atorvastatin 20 mg p.o. daily.     Normocytic anemia Monitor hematocrit and hemoglobin. Hgb trends remain stable Follow-up with primary care provider.   Hypokalemia -Corrected -repeat bmet in AM   Hyponatremia -Pseudohyponatremia in the setting of hyperglycemia -Continue to monitor  Discharge Instructions  Discharge Instructions     Diet - low sodium heart healthy   Complete by: As directed    Discharge wound care:   Complete by: As directed    Daily dressing changes as per instructions from orthopedics   Increase activity slowly   Complete by: As directed       Allergies as of 01/09/2022       Reactions   Bee Venom Swelling   Latex Itching, Rash   Penicillins Other (See Comments)   Childhood allergy Has patient had a PCN reaction causing immediate rash, facial/tongue/throat swelling, SOB or lightheadedness with hypotension: NO Has patient had  a PCN reaction causing severe rash involving mucus membranes or skin  necrosis:NO Has patient had a PCN reaction that required hospitalization NO Has patient had a PCN reaction occurring within the last 10 years: NO If all of the above answers are "NO", then may proceed with Cephalosporin use.        Medication List     STOP taking these medications    mupirocin ointment 2 % Commonly known as: BACTROBAN       TAKE these medications    Accu-Chek Aviva Plus w/Device Kit TEST 3 TIMES DAILY What changed:  how much to take how to take this when to take this   Accu-Chek Softclix Lancets lancets Use as instructed 3 times daily. Dx: Type 1 DM What changed:  how much to take how to take this when to take this   acetaminophen 500 MG tablet Commonly known as: TYLENOL Take 2 tablets (1,000 mg total) by mouth every 8 (eight) hours for 10 days. For Pain.   amoxicillin-clavulanate 875-125 MG tablet Commonly known as: AUGMENTIN Take 1 tablet by mouth 2 (two) times daily.   aspirin-sod bicarb-citric acid 325 MG Tbef tablet Commonly known as: ALKA-SELTZER Take 325 mg by mouth every 6 (six) hours as needed (heartburn).   atorvastatin 20 MG tablet Commonly known as: LIPITOR TAKE 1 TABLET (20 MG TOTAL) BY MOUTH DAILY. What changed: how much to take   Basaglar KwikPen 100 UNIT/ML Inject 18 Units into the skin at bedtime. What changed: how much to take   glucose blood test strip Commonly known as: Accu-Chek Aviva Plus USE TO CHECK BLOOD SUGAR FOUR TIMES DAILY BEFORE MEALS AND AT BEDTIME What changed:  how much to take how to take this when to take this   insulin aspart 100 UNIT/ML injection Commonly known as: NovoLOG Inject 4 Units into the skin 3 (three) times daily with meals. If blood sugar before meals is above 350, use 6 units. What changed: additional instructions   INSULIN SYRINGE 1CC/29G 29G X 1/2" 1 ML Misc use as directed What changed:  how much to take how to take this when to take this   oxyCODONE 5 MG immediate release  tablet Commonly known as: Roxicodone Take 1 tablet (5 mg total) by mouth every 8 (eight) hours as needed.   pantoprazole 40 MG tablet Commonly known as: PROTONIX TAKE 1 TABLET (40 MG TOTAL) BY MOUTH DAILY.   sildenafil 50 MG tablet Commonly known as: Viagra Take 1 tablet (50 mg total) by mouth daily as needed for erectile dysfunction (At least 24 hours between doses).   VISINE OP Place 1 drop into both eyes daily as needed (dryness / allergies).               Discharge Care Instructions  (From admission, onward)           Start     Ordered   01/09/22 0000  Discharge wound care:       Comments: Daily dressing changes as per instructions from orthopedics   01/09/22 1417            Follow-up Information     Marchia Bond, MD. Schedule an appointment as soon as possible for a visit in 1 week(s).   Specialty: Orthopedic Surgery Contact information: [address] [place] [address] [phone]                Allergies  Allergen Reactions   Bee Venom Swelling  Latex Itching and Rash   Penicillins Other (See Comments)    Childhood allergy Has patient had a PCN reaction causing immediate rash, facial/tongue/throat swelling, SOB or lightheadedness with hypotension: NO Has patient had a PCN reaction causing severe rash involving mucus membranes or skin necrosis:NO Has patient had a PCN reaction that required hospitalization NO Has patient had a PCN reaction occurring within the last 10 years: NO If all of the above answers are "NO", then may proceed with Cephalosporin use.    Consultations: Hand surgery   Procedures/Studies: DG Chest Portable 1 View  Result Date: 01/04/2022 CLINICAL DATA:  Fever EXAM: PORTABLE CHEST 1 VIEW COMPARISON:  None Available. FINDINGS: The heart size and mediastinal contours are within normal limits. Both lungs are clear. The visualized skeletal structures are unremarkable. IMPRESSION: No active  disease. Electronically Signed   By: Ulyses Jarred M.D.   On: 01/04/2022 02:24   DG Hand Complete Left  Result Date: 01/03/2022 CLINICAL DATA:  Swelling. EXAM: LEFT HAND - COMPLETE 3+ VIEW COMPARISON:  None Available. FINDINGS: There is diffuse soft tissue swelling of the hand. There is no evidence of fracture or dislocation. There is no evidence of arthropathy or other focal bone abnormality. IMPRESSION: Negative. Electronically Signed   By: Ronney Asters M.D.   On: 01/03/2022 22:53      Subjective: He is feeling better.  Overall swelling in his hand has improved.  Discharge Exam: Vitals:   01/08/22 1332 01/08/22 2137 01/09/22 0543 01/09/22 1400  BP: 101/69 122/87 (!) 124/91 101/75  Pulse: 88 87 79 82  Resp: '17 18 18 18  ' Temp: 98.5 F (36.9 C) 97.8 F (36.6 C) 98.2 F (36.8 C) 98.7 F (37.1 C)  TempSrc: Oral   Oral  SpO2: 99% 99% 100% 100%  Weight:      Height:        General: Pt is alert, awake, not in acute distress Cardiovascular: RRR, S1/S2 +, no rubs, no gallops Respiratory: CTA bilaterally, no wheezing, no rhonchi Abdominal: Soft, NT, ND, bowel sounds + Extremities: no edema, no cyanosis    The results of significant diagnostics from this hospitalization (including imaging, microbiology, ancillary and laboratory) are listed below for reference.     Microbiology: Recent Results (from the past 240 hour(s))  Resp Panel by RT-PCR (Flu A&B, Covid) Throat     Status: None   Collection Time: 01/03/22 10:02 PM   Specimen: Throat; Nasopharyngeal(NP) swabs in vial transport medium  Result Value Ref Range Status   SARS Coronavirus 2 by RT PCR NEGATIVE NEGATIVE Final    Comment: (NOTE) SARS-CoV-2 target nucleic acids are NOT DETECTED.  The SARS-CoV-2 RNA is generally detectable in upper respiratory specimens during the acute phase of infection. The lowest concentration of SARS-CoV-2 viral copies this assay can detect is 138 copies/mL. A negative result does not preclude  SARS-Cov-2 infection and should not be used as the sole basis for treatment or other patient management decisions. A negative result may occur with  improper specimen collection/handling, submission of specimen other than nasopharyngeal swab, presence of viral mutation(s) within the areas targeted by this assay, and inadequate number of viral copies(<138 copies/mL). A negative result must be combined with clinical observations, patient history, and epidemiological information. The expected result is Negative.  Fact Sheet for Patients:  EntrepreneurPulse.com.au  Fact Sheet for Healthcare Providers:  IncredibleEmployment.be  This test is no t yet approved or cleared by the [place] FDA and  has been authorized for detection and/or  diagnosis of SARS-CoV-2 by FDA under an Emergency Use Authorization (EUA). This EUA will remain  in effect (meaning this test can be used) for the duration of the COVID-19 declaration under Section 564(b)(1) of the Act, 21 U.S.C.section 360bbb-3(b)(1), unless the authorization is terminated  or revoked sooner.       Influenza A by PCR NEGATIVE NEGATIVE Final   Influenza B by PCR NEGATIVE NEGATIVE Final    Comment: (NOTE) The Xpert Xpress SARS-CoV-2/FLU/RSV plus assay is intended as an aid in the diagnosis of influenza from Nasopharyngeal swab specimens and should not be used as a sole basis for treatment. Nasal washings and aspirates are unacceptable for Xpert Xpress SARS-CoV-2/FLU/RSV testing.  Fact Sheet for Patients: EntrepreneurPulse.com.au  Fact Sheet for Healthcare Providers: IncredibleEmployment.be  This test is not yet approved or cleared by the [place] FDA and has been authorized for detection and/or diagnosis of SARS-CoV-2 by FDA under an Emergency Use Authorization (EUA). This EUA will remain in effect (meaning this test can be used) for the duration of  the COVID-19 declaration under Section 564(b)(1) of the Act, 21 U.S.C. section 360bbb-3(b)(1), unless the authorization is terminated or revoked.  Performed at [facility], [place] [address] [place], [place] [address]   Group A Strep by PCR     Status: None   Collection Time: 01/03/22 10:02 PM   Specimen: Throat; Sterile Swab  Result Value Ref Range Status   Group A Strep by PCR NOT DETECTED NOT DETECTED Final    Comment: Performed at [facility], [place] [address] [place], [place] [address]  Blood Culture (routine x 2)     Status: None   Collection Time: 01/04/22  2:55 AM   Specimen: BLOOD  Result Value Ref Range Status   Specimen Description   Final    BLOOD BLOOD RIGHT WRIST Performed at [facility] [address], [place], [place] [address]    Special Requests   Final    BOTTLES DRAWN AEROBIC AND ANAEROBIC Blood Culture results may not be optimal due to an excessive volume of blood received in culture bottles Performed at [facility] [address], [place], [place] [address]    Culture   Final    NO GROWTH 5 DAYS Performed at [facility], [place] [address], [place], [place] [address]    Report Status 01/09/2022 FINAL  Final  Blood Culture (routine x 2)     Status: None   Collection Time: 01/04/22  2:55 AM   Specimen: BLOOD  Result Value Ref Range Status   Specimen Description   Final    BLOOD BLOOD LEFT FOREARM Performed at [facility] [address], [place], [place] [address]    Special Requests   Final    BOTTLES DRAWN AEROBIC AND ANAEROBIC Blood Culture results may not be optimal due to an excessive volume of blood received in culture bottles Performed at [facility] [address] [place], [place] [address]    Culture   Final    NO GROWTH 5 DAYS Performed at [facility], [place] [address], [place], [place] [address]    Report Status  01/09/2022 FINAL  Final  Urine Culture     Status: Abnormal   Collection Time: 01/04/22  4:29 AM   Specimen: In/Out Cath Urine  Result Value Ref Range Status   Specimen Description   Final    IN/OUT CATH URINE Performed at  [facility], [place] [address] [place], [place] [address]    Special Requests   Final    NONE Performed at [facility], [place] [address] [place], [place] [address]    Culture (A)  Final    <10,000 COLONIES/mL INSIGNIFICANT GROWTH Performed at [facility] [address] [place], [place] [address]    Report Status 01/05/2022 FINAL  Final  Surgical PCR screen     Status: Abnormal   Collection Time: 01/06/22  8:14 PM   Specimen: Nasal Mucosa; Nasal Swab  Result Value Ref Range Status   MRSA, PCR NEGATIVE NEGATIVE Final   Staphylococcus aureus POSITIVE (A) NEGATIVE Final    Comment: (NOTE) The Xpert SA Assay (FDA approved for NASAL specimens in patients 72 years of age and older), is one component of a comprehensive surveillance program. It is not intended to diagnose infection nor to guide or monitor treatment. Performed at [facility], [place] [address] [place], [place] [address]   Aerobic/Anaerobic Culture w Gram Stain (surgical/deep wound)     Status: None (Preliminary result)   Collection Time: 01/07/22  2:32 PM   Specimen: PATH Soft tissue  Result Value Ref Range Status   Specimen Description   Final    TISSUE Performed at [facility] [address] [place], [place] [address]    Special Requests   Final    NONE LEFT HAND, 1ST AND 2ND WEB SPACE Performed at [facility] [address] [place], [place] [address]    Gram Stain   Final    FEW WBC PRESENT, PREDOMINANTLY PMN FEW GRAM POSITIVE COCCI Performed at [facility], [place] [address] [place] [address], [place], [place] [address]    Culture   Final    ABUNDANT GROUP B STREP(S.AGALACTIAE)ISOLATED TESTING  AGAINST S. AGALACTIAE NOT ROUTINELY PERFORMED DUE TO PREDICTABILITY OF AMP/PEN/VAN SUSCEPTIBILITY. NO ANAEROBES ISOLATED; CULTURE IN PROGRESS FOR 5 DAYS    Report Status PENDING  Incomplete     Labs: BNP (last 3 results) No results for input(s): BNP in the last 8760 hours. Basic Metabolic Panel: Recent Labs  Lab 01/03/22 2214 01/04/22 0255 01/06/22 0322 01/07/22 0301 01/08/22 0312  NA 134* 136 135 131* 128*  K 3.6 3.5 3.2* 4.9 4.9  CL 100 102 103 99 95*  CO2 '24 24 26 24 23  ' GLUCOSE 181* 148* 93 286* 431*  BUN 13 12 <5* 8 17  CREATININE 1.15 1.10 0.85 0.94 1.14  CALCIUM 8.7* 8.5* 7.9* 8.3* 8.3*   Liver Function Tests: Recent Labs  Lab 01/04/22 0255 01/06/22 0322 01/07/22 0301 01/08/22 0312  AST '25 21 24 24  ' ALT '24 18 18 20  ' ALKPHOS 119 84 88 91  BILITOT 1.2 0.8 1.1 0.8  PROT 7.0 5.5* 5.6* 5.7*  ALBUMIN 3.3* 2.6* 2.6* 2.6*   No results for input(s): LIPASE, AMYLASE in the last 168 hours. No results for input(s): AMMONIA in the last 168 hours. CBC: Recent Labs  Lab 01/03/22 2214 01/05/22 0301 01/06/22 0322 01/07/22 0301 01/08/22 0312  WBC 6.0 8.8 5.5 3.5* 5.1  HGB 11.1* 10.7* 9.4* 9.9* 9.8*  HCT 33.0* 32.1* 27.4* 28.9* 29.9*  MCV 84.4 84.3 83.3 84.5 84.2  PLT 322 312 299 343 387   Cardiac Enzymes: No results for input(s): CKTOTAL, CKMB, CKMBINDEX, TROPONINI in the last 168 hours. BNP: Invalid input(s): POCBNP CBG: Recent Labs  Lab 01/08/22 1221 01/08/22 1657 01/08/22 2135 01/09/22 0736 01/09/22 1133  GLUCAP 283* 250* 351* 253* 180*   D-Dimer No results for input(s): DDIMER in the last 72 hours. Hgb A1c No results for input(s): HGBA1C in the last 72 hours. Lipid Profile No results for input(s): CHOL, HDL, LDLCALC, TRIG, CHOLHDL, LDLDIRECT in the last 72 hours. Thyroid function studies No results for input(s): TSH, T4TOTAL, T3FREE, THYROIDAB in the last 72 hours.  Invalid input(s): FREET3 Anemia work up No results for input(s): VITAMINB12,  FOLATE, FERRITIN, TIBC, IRON, RETICCTPCT in the last 72 hours. Urinalysis    Component Value Date/Time   COLORURINE AMBER (A) 01/04/2022 0429   APPEARANCEUR CLEAR 01/04/2022 0429   LABSPEC 1.029 01/04/2022 0429   PHURINE 5.0 01/04/2022 0429   GLUCOSEU 50 (A) 01/04/2022 0429   HGBUR NEGATIVE 01/04/2022 0429   BILIRUBINUR NEGATIVE 01/04/2022 0429   BILIRUBINUR neg 03/16/2015 1230   KETONESUR 20 (A) 01/04/2022 0429   PROTEINUR 100 (A) 01/04/2022 0429   UROBILINOGEN 0.2 06/25/2015 0913   NITRITE NEGATIVE 01/04/2022 0429   LEUKOCYTESUR NEGATIVE 01/04/2022 0429   Sepsis Labs Invalid input(s): PROCALCITONIN,  WBC,  LACTICIDVEN Microbiology Recent Results (from the past 240 hour(s))  Resp Panel by RT-PCR (Flu A&B, Covid) Throat     Status: None   Collection Time: 01/03/22 10:02 PM   Specimen: Throat; Nasopharyngeal(NP) swabs in vial transport medium  Result Value Ref Range Status   SARS Coronavirus 2 by RT PCR NEGATIVE NEGATIVE Final    Comment: (NOTE) SARS-CoV-2 target nucleic acids are NOT DETECTED.  The SARS-CoV-2 RNA is generally detectable in upper respiratory specimens during the acute phase of infection. The lowest concentration of SARS-CoV-2 viral copies this assay can detect is 138 copies/mL. A negative result does not preclude SARS-Cov-2 infection and should not be used as the sole basis for treatment or other patient management decisions. A negative result may occur with  improper specimen collection/handling, submission of specimen other than nasopharyngeal swab, presence of viral mutation(s) within the areas targeted by this assay, and inadequate number of viral copies(<138 copies/mL). A negative result must be combined with clinical observations, patient history, and epidemiological information. The expected result is Negative.  Fact Sheet for Patients:  EntrepreneurPulse.com.au  Fact Sheet for Healthcare Providers:   IncredibleEmployment.be  This test is no t yet approved or cleared by the [place] FDA and  has been authorized for detection and/or diagnosis of SARS-CoV-2 by FDA under an Emergency Use Authorization (EUA). This EUA will remain  in effect (meaning this test can be used) for the duration of the COVID-19 declaration under Section 564(b)(1) of the Act, 21 U.S.C.section 360bbb-3(b)(1), unless the authorization is terminated  or revoked sooner.       Influenza A by PCR NEGATIVE NEGATIVE Final   Influenza B by PCR NEGATIVE NEGATIVE Final    Comment: (NOTE) The Xpert Xpress SARS-CoV-2/FLU/RSV plus assay is intended as an aid in the diagnosis of influenza from Nasopharyngeal swab specimens and should not be used as a sole basis for treatment. Nasal washings and aspirates are unacceptable for Xpert Xpress SARS-CoV-2/FLU/RSV testing.  Fact Sheet for Patients: EntrepreneurPulse.com.au  Fact Sheet for Healthcare Providers: IncredibleEmployment.be  This test is not yet approved or cleared by the [place] FDA and has been authorized for detection and/or diagnosis of SARS-CoV-2 by FDA under an Emergency Use Authorization (EUA). This EUA will remain in effect (meaning this test can be used) for the duration of the COVID-19 declaration under Section 564(b)(1) of the Act, 21 U.S.C. section 360bbb-3(b)(1), unless the authorization is  terminated or revoked.  Performed at [facility], [place] [address], [place], [place] [address]   Group A Strep by PCR     Status: None   Collection Time: 01/03/22 10:02 PM   Specimen: Throat; Sterile Swab  Result Value Ref Range Status   Group A Strep by PCR NOT DETECTED NOT DETECTED Final    Comment: Performed at [facility], [place] [address], [place], [place] [address]  Blood Culture (routine x 2)     Status: None   Collection Time: 01/04/22  2:55 AM    Specimen: BLOOD  Result Value Ref Range Status   Specimen Description   Final    BLOOD BLOOD RIGHT WRIST Performed at [facility] [address], [place], [place] [address]    Special Requests   Final    BOTTLES DRAWN AEROBIC AND ANAEROBIC Blood Culture results may not be optimal due to an excessive volume of blood received in culture bottles Performed at [facility] [address] [place], [place] [address]    Culture   Final    NO GROWTH 5 DAYS Performed at [facility], [place] [address] [place], [place] [address]    Report Status 01/09/2022 FINAL  Final  Blood Culture (routine x 2)     Status: None   Collection Time: 01/04/22  2:55 AM   Specimen: BLOOD  Result Value Ref Range Status   Specimen Description   Final    BLOOD BLOOD LEFT FOREARM Performed at [facility] [address] [place], [place] [address]    Special Requests   Final    BOTTLES DRAWN AEROBIC AND ANAEROBIC Blood Culture results may not be optimal due to an excessive volume of blood received in culture bottles Performed at [facility] [address] [place], [place] [address]    Culture   Final    NO GROWTH 5 DAYS Performed at [facility], [place] [address], [place], [place] [address]    Report Status 01/09/2022 FINAL  Final  Urine Culture     Status: Abnormal   Collection Time: 01/04/22  4:29 AM   Specimen: In/Out Cath Urine  Result Value Ref Range Status   Specimen Description   Final    IN/OUT CATH URINE Performed at [facility] [address], [place], [place] [address]    Special Requests   Final    NONE Performed at [facility], [place] [address] [place], [place] [address]    Culture (A)  Final    <10,000 COLONIES/mL INSIGNIFICANT GROWTH Performed at [facility] [address] [place], [place] [address]    Report Status 01/05/2022 FINAL  Final  Surgical  PCR screen     Status: Abnormal   Collection Time: 01/06/22  8:14 PM   Specimen: Nasal Mucosa; Nasal Swab  Result Value Ref Range Status   MRSA, PCR NEGATIVE NEGATIVE Final   Staphylococcus aureus POSITIVE (A) NEGATIVE Final    Comment: (NOTE) The Xpert SA Assay (FDA approved for NASAL specimens in patients 23 years of age and older), is one component of a comprehensive surveillance program. It is not intended to diagnose infection nor to guide or monitor treatment. Performed at [facility], [place] [address] [place], [place] [address]   Aerobic/Anaerobic Culture w Gram Stain (surgical/deep wound)     Status: None (Preliminary result)   Collection Time: 01/07/22  2:32 PM   Specimen: PATH Soft tissue  Result Value Ref Range Status   Specimen Description   Final    TISSUE Performed at [facility] [address], [place], [place] [address]    Special Requests   Final    NONE LEFT HAND, 1ST AND 2ND WEB SPACE Performed at [facility] [address], [place], [place] [address]    Gram Stain   Final    FEW WBC PRESENT, PREDOMINANTLY PMN FEW GRAM POSITIVE COCCI Performed at [facility], [place] [address], [place], [place] [address]    Culture   Final    ABUNDANT GROUP B STREP(S.AGALACTIAE)ISOLATED TESTING AGAINST S. AGALACTIAE NOT ROUTINELY PERFORMED DUE TO PREDICTABILITY OF AMP/PEN/VAN SUSCEPTIBILITY. NO ANAEROBES ISOLATED; CULTURE IN PROGRESS FOR 5 DAYS    Report Status PENDING  Incomplete     Time coordinating discharge: 57mns  SIGNED:   JKathie Dike MD  [facility] 01/09/2022, 7:24 PM   If 7PM-7AM, please contact night-coverage [email or web address]

## 2022-01-10 ENCOUNTER — Telehealth: Payer: Self-pay

## 2022-01-10 NOTE — Telephone Encounter (Signed)
Transition Care Management Unsuccessful Follow-up Telephone Call  Date of discharge and from where:  [facility] on 01/09/2022  Attempts:  1st Attempt  Reason for unsuccessful TCM follow-up call:  Left voice message unable to reach, call back requested.

## 2022-01-10 NOTE — Telephone Encounter (Signed)
Call placed to patient and message was left informing him to return call.    Needing more information on what he needs for his dexcom meter.

## 2022-01-12 LAB — AEROBIC/ANAEROBIC CULTURE W GRAM STAIN (SURGICAL/DEEP WOUND)

## 2022-01-14 ENCOUNTER — Telehealth: Payer: Self-pay

## 2022-01-14 NOTE — Telephone Encounter (Signed)
Transition Care Management Unsuccessful Follow-up Telephone Call  Date of discharge and from where:  01/09/2022, [facility]  Attempts:  2nd Attempt  Reason for unsuccessful TCM follow-up call:  Left voice message  on # [phone], call back requested.   Need to schedule a hospital follow up appointment with PCP

## 2022-01-15 ENCOUNTER — Telehealth: Payer: Self-pay

## 2022-01-15 NOTE — Telephone Encounter (Signed)
Transition Care Management Unsuccessful Follow-up Telephone Call  Date of discharge and from where:  01/09/2022, [facility]  Attempts:  3rd Attempt  Reason for unsuccessful TCM follow-up call:  Left voice messages on # [phone] and [phone]. Call back requested.  Letter also sent to patient requesting he contact [facility] to schedule a hospital follow up appointment as we have not been able to reach him.

## 2022-01-20 DIAGNOSIS — L02512 Cutaneous abscess of left hand: Secondary | ICD-10-CM | POA: Diagnosis not present

## 2022-01-29 DIAGNOSIS — L02512 Cutaneous abscess of left hand: Secondary | ICD-10-CM | POA: Diagnosis not present

## 2022-02-13 ENCOUNTER — Other Ambulatory Visit: Payer: Self-pay

## 2022-02-14 ENCOUNTER — Other Ambulatory Visit: Payer: Self-pay

## 2022-03-19 ENCOUNTER — Other Ambulatory Visit: Payer: Self-pay

## 2022-03-19 ENCOUNTER — Telehealth: Payer: Self-pay | Admitting: Emergency Medicine

## 2022-03-19 ENCOUNTER — Other Ambulatory Visit: Payer: Self-pay | Admitting: Pharmacist

## 2022-03-19 DIAGNOSIS — E1065 Type 1 diabetes mellitus with hyperglycemia: Secondary | ICD-10-CM

## 2022-03-19 MED ORDER — ACCU-CHEK GUIDE VI STRP
ORAL_STRIP | 0 refills | Status: DC
  Filled 2022-03-19: qty 100, 33d supply, fill #0

## 2022-03-19 MED ORDER — ACCU-CHEK GUIDE VI STRP: ORAL_STRIP | 0 refills | Status: DC

## 2022-03-19 MED ORDER — ACCU-CHEK GUIDE W/DEVICE KIT: PACK | 0 refills | Status: DC

## 2022-03-19 MED ORDER — ACCU-CHEK GUIDE W/DEVICE KIT
PACK | 0 refills | Status: DC
  Filled 2022-03-19: qty 1, 30d supply, fill #0

## 2022-03-19 MED ORDER — ACCU-CHEK SOFTCLIX LANCETS MISC
0 refills | Status: DC
  Filled 2022-03-19: qty 100, 33d supply, fill #0

## 2022-03-19 MED ORDER — ACCU-CHEK SOFTCLIX LANCETS MISC: 0 refills | Status: AC

## 2022-03-19 NOTE — Telephone Encounter (Signed)
Patient will need a visit to address this. If the adhesive is reacting with his skin, will send in a rx for the traditional finger-stick method. Will send the Accu Chek system for him to use.

## 2022-03-19 NOTE — Addendum Note (Signed)
Addended by: Daisy Blossom, Annie Main L on: 03/19/2022 03:55 PM   Modules accepted: Orders

## 2022-03-19 NOTE — Telephone Encounter (Signed)
Copied from [place] [id number]. Topic: General - Call Back - No Documentation >> Mar 19, 2022 11:40 AM Sabas Sous wrote: Reason for CRM: Pt is requesting a call back from the pharmacist regarding a possible alternative to his dexcom, he says that it causes him to have breakouts in his skin especially during the summer.   Best contact: [phone]

## 2022-04-04 ENCOUNTER — Other Ambulatory Visit: Payer: Self-pay

## 2022-04-04 ENCOUNTER — Ambulatory Visit: Payer: Medicare Other | Attending: Family Medicine

## 2022-04-04 DIAGNOSIS — Z0000 Encounter for general adult medical examination without abnormal findings: Secondary | ICD-10-CM

## 2022-04-04 NOTE — Patient Instructions (Signed)
Jared Murray , Thank you for taking time to come for your Medicare Wellness Visit. I appreciate your ongoing commitment to your health goals. Please review the following plan we discussed and let me know if I can assist you in the future.   These are the goals we discussed:  Goals      HEMOGLOBIN A1C < 7.0        This is a list of the screening recommended for you and due dates:  Health Maintenance  Topic Date Due   Eye exam for diabetics  Never done   COVID-19 Vaccine (1) 04/20/2022*   Flu Shot  11/16/2022*   Tetanus Vaccine  04/05/2023*   Complete foot exam   06/20/2022   Hemoglobin A1C  06/21/2022   Urine Protein Check  12/20/2022   Colon Cancer Screening  05/07/2030   Hepatitis C Screening: USPSTF Recommendation to screen - Ages 18-79 yo.  Completed   HIV Screening  Completed   HPV Vaccine  Aged Out  *Topic was postponed. The date shown is not the original due date.   Health Maintenance, Male Adopting a healthy lifestyle and getting preventive care are important in promoting health and wellness. Ask your health care provider about: The right schedule for you to have regular tests and exams. Things you can do on your own to prevent diseases and keep yourself healthy. What should I know about diet, weight, and exercise? Eat a healthy diet  Eat a diet that includes plenty of vegetables, fruits, low-fat dairy products, and lean protein. Do not eat a lot of foods that are high in solid fats, added sugars, or sodium. Maintain a healthy weight Body mass index (BMI) is a measurement that can be used to identify possible weight problems. It estimates body fat based on height and weight. Your health care provider can help determine your BMI and help you achieve or maintain a healthy weight. Get regular exercise Get regular exercise. This is one of the most important things you can do for your health. Most adults should: Exercise for at least 150 minutes each week. The exercise  should increase your heart rate and make you sweat (moderate-intensity exercise). Do strengthening exercises at least twice a week. This is in addition to the moderate-intensity exercise. Spend less time sitting. Even light physical activity can be beneficial. Watch cholesterol and blood lipids Have your blood tested for lipids and cholesterol at 46 years of age, then have this test every 5 years. You may need to have your cholesterol levels checked more often if: Your lipid or cholesterol levels are high. You are older than 46 years of age. You are at high risk for heart disease. What should I know about cancer screening? Many types of cancers can be detected early and may often be prevented. Depending on your health history and family history, you may need to have cancer screening at various ages. This may include screening for: Colorectal cancer. Prostate cancer. Skin cancer. Lung cancer. What should I know about heart disease, diabetes, and high blood pressure? Blood pressure and heart disease High blood pressure causes heart disease and increases the risk of stroke. This is more likely to develop in people who have high blood pressure readings or are overweight. Talk with your health care provider about your target blood pressure readings. Have your blood pressure checked: Every 3-5 years if you are 87-62 years of age. Every year if you are 25 years old or older. If you are between the  ages of 32 and 79 and are a current or former smoker, ask your health care provider if you should have a one-time screening for abdominal aortic aneurysm (AAA). Diabetes Have regular diabetes screenings. This checks your fasting blood sugar level. Have the screening done: Once every three years after age 76 if you are at a normal weight and have a low risk for diabetes. More often and at a younger age if you are overweight or have a high risk for diabetes. What should I know about preventing  infection? Hepatitis B If you have a higher risk for hepatitis B, you should be screened for this virus. Talk with your health care provider to find out if you are at risk for hepatitis B infection. Hepatitis C Blood testing is recommended for: Everyone born from [birth year] through 1965. Anyone with known risk factors for hepatitis C. Sexually transmitted infections (STIs) You should be screened each year for STIs, including gonorrhea and chlamydia, if: You are sexually active and are younger than 46 years of age. You are older than 46 years of age and your health care provider tells you that you are at risk for this type of infection. Your sexual activity has changed since you were last screened, and you are at increased risk for chlamydia or gonorrhea. Ask your health care provider if you are at risk. Ask your health care provider about whether you are at high risk for HIV. Your health care provider may recommend a prescription medicine to help prevent HIV infection. If you choose to take medicine to prevent HIV, you should first get tested for HIV. You should then be tested every 3 months for as long as you are taking the medicine. Follow these instructions at home: Alcohol use Do not drink alcohol if your health care provider tells you not to drink. If you drink alcohol: Limit how much you have to 0-2 drinks a day. Know how much alcohol is in your drink. In the [place] one drink equals one 12 oz bottle of beer (355 mL), one 5 oz glass of wine (148 mL), or one 1 oz glass of hard liquor (44 mL). Lifestyle Do not use any products that contain nicotine or tobacco. These products include cigarettes, chewing tobacco, and vaping devices, such as e-cigarettes. If you need help quitting, ask your health care provider. Do not use street drugs. Do not share needles. Ask your health care provider for help if you need support or information about quitting drugs. General instructions Schedule regular health,  dental, and eye exams. Stay current with your vaccines. Tell your health care provider if: You often feel depressed. You have ever been abused or do not feel safe at home. Summary Adopting a healthy lifestyle and getting preventive care are important in promoting health and wellness. Follow your health care provider's instructions about healthy diet, exercising, and getting tested or screened for diseases. Follow your health care provider's instructions on monitoring your cholesterol and blood pressure. This information is not intended to replace advice given to you by your health care provider. Make sure you discuss any questions you have with your health care provider. Document Revised: 12/24/2020 Document Reviewed: 12/24/2020 Elsevier Patient Education  [place] for Falls Each year, millions of people have serious injuries from falls. It is important to understand your risk for falling. Talk with your health care provider about your risk and what you can do to lower it. There are actions you can take at  home to lower your risk and prevent falls. If you do have a serious fall, make sure to tell your health care provider. Falling once raises your risk of falling again. How can falls affect me? Serious injuries from falls are common. These include: Broken bones, such as hip fractures. Head injuries, such as traumatic brain injuries (TBI) or concussion. A fear of falling can cause you to avoid activities and stay at home. This can make your muscles weaker and actually raise your risk for a fall. What can increase my risk? There are a number of risk factors that increase your risk for falling. The more risk factors you have, the higher your risk of falling. Serious injuries from a fall happen most often to people older than age 72. Children and young adults ages 43-29 are also at higher risk. Common risk factors include: Weakness in the lower body. Lack  (deficiency) of vitamin D. Being generally weak or confused due to long-term (chronic) illness. Dizziness or balance problems. Poor vision. Medicines that cause dizziness or drowsiness. These can include medicines for your blood pressure, heart, anxiety, insomnia, or edema, as well as pain medicines and muscle relaxants. Other risk factors include: Drinking alcohol. Having had a fall in the past. Having depression. Having foot pain or wearing improper footwear. Working at a dangerous job. Having any of the following in your home: Tripping hazards, such as floor clutter or loose rugs. Poor lighting. Pets. Having dementia or memory loss. What actions can I take to lower my risk of falling?     Physical activity Maintain physical fitness. Do strength and balance exercises. Consider taking a regular class to build strength and balance. Yoga and tai chi are good options. Vision Have your eyes checked every year and your vision prescription updated as needed. Walking aids and footwear Wear nonskid shoes. Do not wear high heels. Do not walk around the house in socks or slippers. Use a cane or walker as told by your health care provider. Home safety Attach secure railings on both sides of your stairs. Install grab bars for your tub, shower, and toilet. Use a bath mat in your tub or shower. Use good lighting in all rooms. Keep a flashlight near your bed. Make sure there is a clear path from your bed to the bathroom. Use night-lights. Do not use throw rugs. Make sure all carpeting is taped or tacked down securely. Remove all clutter from walkways and stairways, including extension cords. Repair uneven or broken steps. Avoid walking on icy or slippery surfaces. Walk on the grass instead of on icy or slick sidewalks. Use ice melt to get rid of ice on walkways. Use a cordless phone. Questions to ask your health care provider Can you help me check my risk for a fall? Do any of my medicines  make me more likely to fall? Should I take a vitamin D supplement? What exercises can I do to improve my strength and balance? Should I make an appointment to have my vision checked? Do I need a bone density test to check for weak bones or osteoporosis? Would it help to use a cane or a walker? Where to find more information Centers for Disease Control and Prevention, STEAD[employer]: http://www.wolf.info/ Community-Based Fall Prevention Programs: http://www.wolf.info/ National Institute on Aging: http://kim-miller.com/ Contact a health care provider if: You fall at home. You are afraid of falling at home. You feel weak, drowsy, or dizzy. Summary Serious injuries from a fall happen most often to people older  than age 2. Children and young adults ages 40-29 are also at higher risk. Talk with your health care provider about your risks for falling and how to lower those risks. Taking certain precautions at home can lower your risk for falling. If you fall, always tell your health care provider. This information is not intended to replace advice given to you by your health care provider. Make sure you discuss any questions you have with your health care provider. Document Revised: 03/07/2020 Document Reviewed: 03/07/2020 Elsevier Patient Education  [place].

## 2022-04-04 NOTE — Progress Notes (Signed)
Subjective:   Jared Murray is a 46 y.o. male who presents for an Initial Medicare Annual Wellness Visit.  Review of Systems     I connected with [place] Neighbor on 04/04/2022 at 11:52 am by telephone and verified that I am speaking with the correct person using two identifiers. I discussed the limitations, risks, security and privacy concerns of performing an evaluation and management service by telephone and the availability of in person appointments. I also discussed with the patient that there may be a patient responsible charge related to this service. The patient expressed understanding and agreed to proceed.   Patient location: Home  My Location: [place] on the telephone call: Myself and Patient      Cardiac Risk Factors include: none     Objective:    Today's Vitals   04/04/22 1153  PainSc: 5    There is no height or weight on file to calculate BMI.     04/04/2022   12:00 PM 01/04/2022    8:03 AM 01/03/2022    9:53 PM 09/03/2021    1:06 PM 05/16/2020    9:25 AM 05/07/2020   11:53 AM 02/14/2020    9:31 AM  Advanced Directives  Does Patient Have a Medical Advance Directive? No No No No No No No  Would patient like information on creating a medical advance directive? No - Patient declined No - Patient declined  No - Patient declined  No - Patient declined     Current Medications (verified) Outpatient Encounter Medications as of 04/04/2022  Medication Sig   Accu-Chek Softclix Lancets lancets Use to check blood sugar three times daily. E10.65   aspirin-sod bicarb-citric acid (ALKA-SELTZER) 325 MG TBEF tablet Take 325 mg by mouth every 6 (six) hours as needed (heartburn).   atorvastatin (LIPITOR) 20 MG tablet TAKE 1 TABLET (20 MG TOTAL) BY MOUTH DAILY. (Patient taking differently: Take 20 mg by mouth daily.)   Blood Glucose Monitoring Suppl (ACCU-CHEK GUIDE) w/Device KIT Use to check blood sugar 3 times daily. E10.65   glucose blood (ACCU-CHEK  GUIDE) test strip Use to check blood sugar three times daily. E10.65   insulin aspart (NOVOLOG) 100 UNIT/ML injection Inject 4 Units into the skin 3 (three) times daily with meals. If blood sugar before meals is above 350, use 6 units. (Patient taking differently: Inject 4 Units into the skin 3 (three) times daily with meals. If blood sugar before meals is above 250, use 4 units. Over 350 take 6 units.  If sugar is under 150 >> no insulin)   Insulin Glargine (BASAGLAR KWIKPEN) 100 UNIT/ML Inject 18 Units into the skin at bedtime. (Patient taking differently: Inject 15 Units into the skin at bedtime.)   INSULIN SYRINGE 1CC/29G 29G X 1/2" 1 ML MISC use as directed (Patient taking differently: 1 each by Other route See admin instructions. use as directed)   pantoprazole (PROTONIX) 40 MG tablet TAKE 1 TABLET (40 MG TOTAL) BY MOUTH DAILY.   Tetrahydrozoline HCl (VISINE OP) Place 1 drop into both eyes daily as needed (dryness / allergies).   amoxicillin-clavulanate (AUGMENTIN) 875-125 MG tablet Take 1 tablet by mouth 2 (two) times daily. (Patient not taking: Reported on 04/04/2022)   oxyCODONE (ROXICODONE) 5 MG immediate release tablet Take 1 tablet (5 mg total) by mouth every 8 (eight) hours as needed. (Patient not taking: Reported on 04/04/2022)   sildenafil (VIAGRA) 50 MG tablet Take 1 tablet (50 mg total) by mouth daily as  needed for erectile dysfunction (At least 24 hours between doses). (Patient not taking: Reported on 01/04/2022)   No facility-administered encounter medications on file as of 04/04/2022.    Allergies (verified) Bee venom, Latex, and Penicillins   History: Past Medical History:  Diagnosis Date   Acute pericarditis 03/31/2016   AKI (acute kidney injury) ([place]) 01/22/2015   Allergy    Anxiety    per pt   Arthritis    Avascular necrosis ([place]) 02/12/2015   Avascular necrosis of bone of hip ([place])    Chest wall abscess 04/03/2015   Chronic diarrhea of unknown origin 09/10/2017    Cutaneous abscess of chest wall    Depression    per pt   DKA (diabetic ketoacidoses)    DKA, type 1, not at goal [facility]) 04/03/2015   Eczema    Elevated liver enzymes 11/27/2018   GERD (gastroesophageal reflux disease)    HLD (hyperlipidemia) 11/25/2018   no meds   Hyperbilirubinemia    Hyperglycemia due to type 1 diabetes mellitus (HCC)    Hyperkalemia    Hypokalemia 03/17/2013   Left hip pain 04/18/2014   Loss of weight 03/17/2013   Marijuana use 11/25/2018   Osteoarthritis 12/24/2016   Prolonged QT interval 11/27/2018   in setting of RBBB and hypokalemia   Protein-calorie malnutrition, severe ([place]) 03/17/2013   Psoriasis    RBBB 12/23/2018   Chronic since at least 2014   Sepsis ([place]) 01/22/2015   Substance abuse ([place])    Tobacco use disorder 09/13/2015   Type 1 diabetes mellitus ([place])    Past Surgical History:  Procedure Laterality Date   COLONOSCOPY  2020   COLONOSCOPY WITH PROPOFOL N/A 05/07/2020   Procedure: COLONOSCOPY WITH PROPOFOL;  Surgeon: Irving Copas., MD;  Location: [facility];  Service: Gastroenterology;  Laterality: N/A;   ENDOSCOPIC MUCOSAL RESECTION N/A 05/07/2020   Procedure: ENDOSCOPIC MUCOSAL RESECTION;  Surgeon: Rush Landmark Telford Nab., MD;  Location: [facility];  Service: Gastroenterology;  Laterality: N/A;   FINGER SURGERY Right    middle finger   INCISION AND DRAINAGE ABSCESS Left 01/07/2022   Procedure: IRRIGATION AND DEBRIDEMENT OF LEFT HAND;  Surgeon: Marchia Bond, MD;  Location: [facility];  Service: Orthopedics;  Laterality: Left;   SUBMUCOSAL LIFTING INJECTION  05/07/2020   Procedure: SUBMUCOSAL LIFTING INJECTION;  Surgeon: Rush Landmark Telford Nab., MD;  Location: [facility];  Service: Gastroenterology;;   Family History  Problem Relation Age of Onset   Hypertension Father    Diabetes Other        multiple   Lupus Cousin    Stroke Maternal Grandmother    Stroke Paternal Grandmother    Colon cancer Neg Hx    Stomach cancer Neg Hx    Rectal  cancer Neg Hx    Esophageal cancer Neg Hx    Colon polyps Neg Hx    Social History   Socioeconomic History   Marital status: Married    Spouse name: Not on file   Number of children: Not on file   Years of education: Not on file   Highest education level: Not on file  Occupational History   Not on file  Tobacco Use   Smoking status: Former    Packs/day: 0.25    Years: 10.00    Total pack years: 2.50    Types: Cigarettes    Quit date: 11/26/2018    Years since quitting: 3.3   Smokeless tobacco: Never  Vaping Use   Vaping Use: Never used  Substance and Sexual Activity   Alcohol use: Not Currently    Alcohol/week: 1.0 - 2.0 standard drink of alcohol    Types: 1 - 2 Shots of liquor per week    Comment: none since 2020   Drug use: Yes    Types: Marijuana   Sexual activity: Yes  Other Topics Concern   Not on file  Social History Narrative   Lives in [place] - works as a Education officer, museum   Married   Social Determinants of Radio broadcast assistant Strain: Low Risk  (04/04/2022)   Overall Financial Resource Strain (CARDIA)    Difficulty of Paying Living Expenses: Not very hard  Food Insecurity: No Food Insecurity (04/04/2022)   Hunger Vital Sign    Worried About Running Out of Food in the Last Year: Never true    [place] in the Last Year: Never true  Transportation Needs: No Transportation Needs (04/04/2022)   PRAPARE - Hydrologist (Medical): No    Lack of Transportation (Non-Medical): No  Physical Activity: Insufficiently Active (04/04/2022)   Exercise Vital Sign    Days of Exercise per Week: 7 days    Minutes of Exercise per Session: 20 min  Stress: No Stress Concern Present (04/04/2022)   [place]    Feeling of Stress : Not at all  Social Connections: Moderately Integrated (04/04/2022)   Social Connection and Isolation Panel [NHANES]    Frequency of Communication  with Friends and Family: Twice a week    Frequency of Social Gatherings with Friends and Family: Once a week    Attends Religious Services: 1 to 4 times per year    Active Member of [employer] or Organizations: No    Attends Music therapist: Never    Marital Status: Married    Tobacco Counseling Counseling given: Not Answered   Clinical Intake:  Pre-visit preparation completed: No  Pain : 0-10 Pain Score: 5  Pain Type: Chronic pain Pain Location: Foot Pain Orientation: Left, Right Pain Descriptors / Indicators: Aching, Burning Pain Onset: More than a month ago Pain Frequency: Occasional     Nutritional Risks: None Diabetes: Yes CBG done?: No Did pt. bring in CBG monitor from home?: No  How often do you need to have someone help you when you read instructions, pamphlets, or other written materials from your doctor or pharmacy?: 1 - Never  Diabetic?Yes  Interpreter Needed?: No      Activities of Daily Living    04/04/2022   12:00 PM 01/04/2022    8:03 AM  In your present state of health, do you have any difficulty performing the following activities:  Hearing? 0 0  Vision? 0 0  Difficulty concentrating or making decisions? 0 0  Walking or climbing stairs? 1 0  Dressing or bathing? 1 0  Doing errands, shopping? 0 0  Preparing Food and eating ? N   Using the Toilet? N   In the past six months, have you accidently leaked urine? N   Do you have problems with loss of bowel control? N   Managing your Medications? N   Managing your Finances? N   Housekeeping or managing your Housekeeping? N     Patient Care Team: Charlott Rakes, MD as PCP - General (Family Medicine)  Indicate any recent Medical Services you may have received from other than [facility] providers in the past year (date may be approximate).  Assessment:   This is a routine wellness examination for J[place].  Hearing/Vision screen No results found.  Dietary issues and exercise  activities discussed: Current Exercise Habits: Home exercise routine, Type of exercise: stretching, Time (Minutes): 20, Frequency (Times/Week): 3, Weekly Exercise (Minutes/Week): 60, Intensity: Mild, Exercise limited by: None identified   Goals Addressed   None    Depression Screen    04/04/2022   12:00 PM 12/19/2021   11:37 AM 10/10/2021    3:06 PM 12/18/2020   10:26 AM 05/16/2020    9:31 AM 11/09/2019    8:54 AM 12/16/2018    3:06 PM  PHQ 2/9 Scores  PHQ - 2 Score 0 '2 2 2 3 2 2  ' PHQ- 9 Score  '8 4 8 11 6 9    ' Fall Risk    04/04/2022   12:00 PM 12/19/2021   11:37 AM 06/20/2021   10:49 AM 12/18/2020   10:15 AM 05/16/2020    9:23 AM  Fall Risk   Falls in the past year? 0 0 0 0 0  Number falls in past yr: 0 0 0 0   Injury with Fall? 0 0 0 0   Risk for fall due to : No Fall Risks        FALL RISK PREVENTION PERTAINING TO THE HOME:  Any stairs in or around the home? Yes  If so, are there any without handrails? No  Home free of loose throw rugs in walkways, pet beds, electrical cords, etc? Yes  Adequate lighting in your home to reduce risk of falls? Yes   ASSISTIVE DEVICES UTILIZED TO PREVENT FALLS:  Life alert? No  Use of a cane, walker or w/c? Yes  Grab bars in the bathroom? No  Shower chair or bench in shower? Yes  Elevated toilet seat or a handicapped toilet? No   TIMED UP AND GO:  Was the test performed? No .  Length of time to ambulate 10 feet: 0  sec.   Gait slow and steady with assistive device  Cognitive Function:    04/04/2022   12:02 PM  MMSE - Mini Mental State Exam  Orientation to time 5  Orientation to Place 5  Registration 3  Attention/ Calculation 5  Recall 3  Language- name 2 objects 2  Language- repeat 1  Language- follow 3 step command 3  Language- read & follow direction 1  Write a sentence 1  Copy design 1  Total score 30        04/04/2022   12:03 PM  6CIT Screen  What Year? 0 points  What month? 0 points  What time? 0 points  Count  back from 20 0 points  Months in reverse 0 points  Repeat phrase 0 points  Total Score 0 points    Immunizations Immunization History  Administered Date(s) Administered   Influenza,inj,Quad PF,6+ Mos 05/16/2015   Pneumococcal Polysaccharide-23 03/18/2013, 01/26/2014    TDAP status: Due, Education has been provided regarding the importance of this vaccine. Advised may receive this vaccine at local pharmacy or Health Dept. Aware to provide a copy of the vaccination record if obtained from local pharmacy or Health Dept. Verbalized acceptance and understanding.  Flu Vaccine status: Declined, Education has been provided regarding the importance of this vaccine but patient still declined. Advised may receive this vaccine at local pharmacy or Health Dept. Aware to provide a copy of the vaccination record if obtained from local pharmacy or Health Dept. Verbalized acceptance and understanding.  Pneumococcal vaccine status: Declined,  Education has been provided regarding the importance of this vaccine but patient still declined. Advised may receive this vaccine at local pharmacy or Health Dept. Aware to provide a copy of the vaccination record if obtained from local pharmacy or Health Dept. Verbalized acceptance and understanding.   Covid-19 vaccine status: Declined, Education has been provided regarding the importance of this vaccine but patient still declined. Advised may receive this vaccine at local pharmacy or Health Dept.or vaccine clinic. Aware to provide a copy of the vaccination record if obtained from local pharmacy or Health Dept. Verbalized acceptance and understanding.  Qualifies for Shingles Vaccine? No   Zostavax completed No   Shingrix Completed?: No.    Education has been provided regarding the importance of this vaccine. Patient has been advised to call insurance company to determine out of pocket expense if they have not yet received this vaccine. Advised may also receive vaccine at  local pharmacy or Health Dept. Verbalized acceptance and understanding.  Screening Tests Health Maintenance  Topic Date Due   COVID-19 Vaccine (1) Never done   OPHTHALMOLOGY EXAM  Never done   TETANUS/TDAP  Never done   INFLUENZA VACCINE  03/18/2022   FOOT EXAM  06/20/2022   HEMOGLOBIN A1C  06/21/2022   URINE MICROALBUMIN  12/20/2022   COLONOSCOPY (Pts 45-66yr Insurance coverage will need to be confirmed)  05/07/2030   Hepatitis C Screening  Completed   HIV Screening  Completed   HPV VACCINES  Aged Out    Health Maintenance  Health Maintenance Due  Topic Date Due   COVID-19 Vaccine (1) Never done   OPHTHALMOLOGY EXAM  Never done   TETANUS/TDAP  Never done   INFLUENZA VACCINE  03/18/2022    Colorectal cancer screening: Type of screening: Colonoscopy. Completed 05/07/2020. Repeat every 10 years  Lung Cancer Screening: (Low Dose CT Chest recommended if Age 46-80years, 30 pack-year currently smoking OR have quit w/in 15years.) does not qualify.   Lung Cancer Screening Referral: No  Additional Screening:  Hepatitis C Screening: does qualify; Completed 02/01/2015  Vision Screening: Recommended annual ophthalmology exams for early detection of glaucoma and other disorders of the eye. Is the patient up to date with their annual eye exam?  Yes  Who is the provider or what is the name of the office in which the patient attends annual eye exams[facility] not [facility] with a provider, would they like to be referred to a provider to establish care? No .   Dental Screening: Recommended annual dental exams for proper oral hygiene  Community Resource Referral / Chronic Care Management: CRR required this visit?  No   CCM required this visit?  No      Plan:     I have personally reviewed and noted the following in the patient's chart:   Medical and social history Use of alcohol, tobacco or illicit drugs  Current medications and supplements including  opioid prescriptions. Patient is not currently taking opioid prescriptions. Functional ability and status Nutritional status Physical activity Advanced directives List of other physicians Hospitalizations, surgeries, and ER visits in previous 12 months Vitals Screenings to include cognitive, depression, and falls Referrals and appointments  In addition, I have reviewed and discussed with patient certain preventive protocols, quality metrics, and best practice recommendations. A written personalized care plan for preventive services as well as general preventive health recommendations were provided to patient.     AGomez Cleverly CMA  04/04/2022   Nurse Notes: I spent 30 minutes on this telephone encounter  AVS mailed to patient Pt declined all vaccines.

## 2022-04-10 ENCOUNTER — Ambulatory Visit: Payer: Medicare Other | Admitting: Family Medicine

## 2022-04-10 ENCOUNTER — Other Ambulatory Visit: Payer: Self-pay

## 2022-05-30 ENCOUNTER — Other Ambulatory Visit: Payer: Self-pay

## 2022-06-02 ENCOUNTER — Other Ambulatory Visit: Payer: Self-pay

## 2022-06-11 ENCOUNTER — Ambulatory Visit: Payer: Self-pay

## 2022-06-11 NOTE — Patient Outreach (Signed)
  Care Coordination   06/11/2022 Name: Jared Murray MRN: [id number] DOB: 1975/12/21   Care Coordination Outreach Attempts:  An unsuccessful telephone outreach was attempted today to offer the patient information about available care coordination services as a benefit of their health plan.   Follow Up Plan:  Additional outreach attempts will be made to offer the patient care coordination information and services.   Encounter Outcome:  No Answer  Care Coordination Interventions Activated:  No   Care Coordination Interventions:  No, not indicated    Daneen Schick, BSW, CDP Social Worker, Certified Dementia Practitioner [facility] Care Management  Care Coordination [phone]

## 2022-08-05 ENCOUNTER — Other Ambulatory Visit: Payer: Self-pay

## 2022-08-07 ENCOUNTER — Ambulatory Visit: Payer: Medicare Other | Admitting: Pharmacist

## 2022-08-15 NOTE — Progress Notes (Deleted)
Name: Jared Murray  MRN/ DOB: [id number], 12-11-1975   Age/ Sex: 46 y.o., male    PCP: Charlott Rakes, MD   Reason for Endocrinology Evaluation: Type 1 Diabetes Mellitus     Date of Initial Endocrinology Visit: 08/19/2022     PATIENT IDENTIFIER: Jared Murray is a 46 y.o. male with a past medical history of T1DM, . The patient presented for initial endocrinology clinic visit on 08/19/2022 for consultative assistance with his diabetes management.    HPI: Mr. Dini was    Diagnosed with DM *** Prior Medications tried/Intolerance: *** Currently checking blood sugars *** x / day,  before breakfast and ***.  Hypoglycemia episodes : ***               Symptoms: ***                 Frequency: ***/  Hemoglobin A1c has ranged from 10.6% in 2022, peaking at 13.0% in 2020.   In terms of diet, the patient ***   HOME DIABETES REGIMEN: Basaglar  Novolog    Statin: {Yes/No:11203} ACE-I/ARB: {YES/NO:17245} Prior Diabetic Education: {Yes/No:11203}   METER DOWNLOAD SUMMARY: Date range evaluated: *** Fingerstick Blood Glucose Tests = *** Average Number Tests/Day = *** Overall Mean FS Glucose = *** Standard Deviation = ***  BG Ranges: Low = *** High = ***   Hypoglycemic Events/30 Days: BG < 50 = *** Episodes of symptomatic severe hypoglycemia = ***   DIABETIC COMPLICATIONS: Microvascular complications:  *** Denies: CKD Last eye exam: Completed 09/25/2021- Dr. Gershon Crane   Macrovascular complications:   Denies: CAD, PVD, CVA   PAST HISTORY: Past Medical History:  Past Medical History:  Diagnosis Date   Acute pericarditis 03/31/2016   AKI (acute kidney injury) ([place]) 01/22/2015   Allergy    Anxiety    per pt   Arthritis    Avascular necrosis ([place]) 02/12/2015   Avascular necrosis of bone of hip ([place])    Chest wall abscess 04/03/2015   Chronic diarrhea of unknown origin 09/10/2017   Cutaneous abscess of chest wall    Depression    per pt   DKA (diabetic  ketoacidoses)    DKA, type 1, not at goal ([place]) 04/03/2015   Eczema    Elevated liver enzymes 11/27/2018   GERD (gastroesophageal reflux disease)    HLD (hyperlipidemia) 11/25/2018   no meds   Hyperbilirubinemia    Hyperglycemia due to type 1 diabetes mellitus (HCC)    Hyperkalemia    Hypokalemia 03/17/2013   Left hip pain 04/18/2014   Loss of weight 03/17/2013   Marijuana use 11/25/2018   Osteoarthritis 12/24/2016   Prolonged QT interval 11/27/2018   in setting of RBBB and hypokalemia   Protein-calorie malnutrition, severe ([place]) 03/17/2013   Psoriasis    RBBB 12/23/2018   Chronic since at least 2014   Sepsis ([place]) 01/22/2015   Substance abuse ([place])    Tobacco use disorder 09/13/2015   Type 1 diabetes mellitus ([place])    Past Surgical History:  Past Surgical History:  Procedure Laterality Date   COLONOSCOPY  2020   COLONOSCOPY WITH PROPOFOL N/A 05/07/2020   Procedure: COLONOSCOPY WITH PROPOFOL;  Surgeon: Irving Copas., MD;  Location: [place];  Service: Gastroenterology;  Laterality: N/A;   ENDOSCOPIC MUCOSAL RESECTION N/A 05/07/2020   Procedure: ENDOSCOPIC MUCOSAL RESECTION;  Surgeon: Rush Landmark Telford Nab., MD;  Location: [place];  Service: Gastroenterology;  Laterality: N/A;   FINGER SURGERY Right    middle  finger   INCISION AND DRAINAGE ABSCESS Left 01/07/2022   Procedure: IRRIGATION AND DEBRIDEMENT OF LEFT HAND;  Surgeon: Marchia Bond, MD;  Location: [facility];  Service: Orthopedics;  Laterality: Left;   SUBMUCOSAL LIFTING INJECTION  05/07/2020   Procedure: SUBMUCOSAL LIFTING INJECTION;  Surgeon: Rush Landmark Telford Nab., MD;  Location: [place];  Service: Gastroenterology;;    Social History:  reports that he quit smoking about 3 years ago. His smoking use included cigarettes. He has a 2.50 pack-year smoking history. He has never used smokeless tobacco. He reports that he does not currently use alcohol after a past usage of about 1.0 - 2.0 standard drink of alcohol per  week. He reports current drug use. Drug: Marijuana. Family History:  Family History  Problem Relation Age of Onset   Hypertension Father    Diabetes Other        multiple   Lupus Cousin    Stroke Maternal Grandmother    Stroke Paternal Grandmother    Colon cancer Neg Hx    Stomach cancer Neg Hx    Rectal cancer Neg Hx    Esophageal cancer Neg Hx    Colon polyps Neg Hx      HOME MEDICATIONS: Allergies as of 08/19/2022       Reactions   Bee Venom Swelling   Latex Itching, Rash   Penicillins Other (See Comments)   Childhood allergy Has patient had a PCN reaction causing immediate rash, facial/tongue/throat swelling, SOB or lightheadedness with hypotension: NO Has patient had a PCN reaction causing severe rash involving mucus membranes or skin necrosis:NO Has patient had a PCN reaction that required hospitalization NO Has patient had a PCN reaction occurring within the last 10 years: NO If all of the above answers are "NO", then may proceed with Cephalosporin use.        Medication List        Accurate as of August 19, 2022  7:02 AM. If you have any questions, ask your nurse or doctor.          Accu-Chek Guide test strip Generic drug: glucose blood Use to check blood sugar three times daily. E10.65   Accu-Chek Guide w/Device Kit Use to check blood sugar 3 times daily. E10.65   Accu-Chek Softclix Lancets lancets Use to check blood sugar three times daily. E10.65   amoxicillin-clavulanate 875-125 MG tablet Commonly known as: AUGMENTIN Take 1 tablet by mouth 2 (two) times daily.   aspirin-sod bicarb-citric acid 325 MG Tbef tablet Commonly known as: ALKA-SELTZER Take 325 mg by mouth every 6 (six) hours as needed (heartburn).   atorvastatin 20 MG tablet Commonly known as: LIPITOR TAKE 1 TABLET (20 MG TOTAL) BY MOUTH DAILY. What changed: how much to take   Basaglar KwikPen 100 UNIT/ML Inject 18 Units into the skin at bedtime. What changed: how much to take    INSULIN SYRINGE 1CC/29G 29G X 1/2" 1 ML Misc use as directed What changed:  how much to take how to take this when to take this   NovoLOG 100 UNIT/ML injection Generic drug: insulin aspart Inject 4 Units into the skin 3 (three) times daily with meals. If blood sugar before meals is above 350, use 6 units. What changed: additional instructions   oxyCODONE 5 MG immediate release tablet Commonly known as: Roxicodone Take 1 tablet (5 mg total) by mouth every 8 (eight) hours as needed.   pantoprazole 40 MG tablet Commonly known as: PROTONIX TAKE 1 TABLET (40 MG TOTAL) BY  MOUTH DAILY.   sildenafil 50 MG tablet Commonly known as: Viagra Take 1 tablet (50 mg total) by mouth daily as needed for erectile dysfunction (At least 24 hours between doses).   VISINE OP Place 1 drop into both eyes daily as needed (dryness / allergies).         ALLERGIES: Allergies  Allergen Reactions   Bee Venom Swelling   Latex Itching and Rash   Penicillins Other (See Comments)    Childhood allergy Has patient had a PCN reaction causing immediate rash, facial/tongue/throat swelling, SOB or lightheadedness with hypotension: NO Has patient had a PCN reaction causing severe rash involving mucus membranes or skin necrosis:NO Has patient had a PCN reaction that required hospitalization NO Has patient had a PCN reaction occurring within the last 10 years: NO If all of the above answers are "NO", then may proceed with Cephalosporin use.     REVIEW OF SYSTEMS: A comprehensive ROS was conducted with the patient and is negative except as per HPI and below:  ROS    OBJECTIVE:   VITAL SIGNS: There were no vitals taken for this visit.   PHYSICAL EXAM:  General: Pt appears well and is in NAD  Neck: General: Supple without adenopathy or carotid bruits. Thyroid: Thyroid size normal.  No goiter or nodules appreciated.   Lungs: Clear with good BS bilat with no rales, rhonchi, or wheezes  Heart: RRR    Abdomen:  soft, nontender  Extremities:  Lower extremities - No pretibial edema. No lesions.  Skin: Normal texture and temperature to palpation. No rash noted.  Neuro: MS is good with appropriate affect, pt is alert and Ox3    DM foot exam:    DATA REVIEWED:  Lab Results  Component Value Date   HGBA1C 11.0 (A) 12/19/2021   HGBA1C 13.5 (H) 10/10/2021   HGBA1C 10.6 (A) 12/18/2020   Lab Results  Component Value Date   MICROALBUR <0.2 12/19/2015   LDLCALC 59 12/19/2021   CREATININE 1.14 01/08/2022   Lab Results  Component Value Date   MICRALBCREAT 16 12/19/2021    Lab Results  Component Value Date   CHOL 107 12/19/2021   HDL 38 (L) 12/19/2021   LDLCALC 59 12/19/2021   TRIG 37 12/19/2021   CHOLHDL 2.9 12/18/2020        ASSESSMENT / PLAN / RECOMMENDATIONS:   1) Type *** Diabetes Mellitus, ***controlled, With*** complications - Most recent A1c of *** %. Goal A1c < *** %.  ***  Plan: GENERAL: ***  MEDICATIONS: ***  EDUCATION / INSTRUCTIONS: BG monitoring instructions: Patient is instructed to check his blood sugars *** times a day, ***. Call [facility] if: BG persistently < 70  I reviewed the Rule of 15 for the treatment of hypoglycemia in detail with the patient. Literature supplied.   2) Diabetic complications:  Eye: Does *** have known diabetic retinopathy.  Neuro/ Feet: Does *** have known diabetic peripheral neuropathy. Renal: Patient does *** have known baseline CKD. He is *** on an ACEI/ARB at present.  3) Lipids: Patient is *** on a statin.    4) Hypertension: ***  at goal of < 140/90 mmHg.       Signed electronically by: Mack Guise, MD  [facility]  [facility] [address] [place], [place] [address] Phone: [phone] FAX: [phone]   CC: Charlott Rakes, [place] [address] Phone: [phone]  Fax: [phone]  Return to  Endocrinology clinic as below: Future Appointments  Date Time Provider [place]  08/19/2022 11:50 AM Queenie Aufiero, Melanie Crazier, MD [id number] None  09/17/2022  3:30 PM Charlott Rakes, MD [id number] None

## 2022-08-19 ENCOUNTER — Ambulatory Visit: Payer: Medicare Other | Admitting: Internal Medicine

## 2022-09-17 ENCOUNTER — Encounter: Payer: Self-pay | Admitting: Family Medicine

## 2022-09-17 ENCOUNTER — Ambulatory Visit: Payer: Medicare Other | Attending: Family Medicine | Admitting: Family Medicine

## 2022-09-17 VITALS — BP 106/69 | HR 87 | Temp 98.0°F | Ht 70.0 in | Wt 130.4 lb

## 2022-09-17 DIAGNOSIS — K219 Gastro-esophageal reflux disease without esophagitis: Secondary | ICD-10-CM | POA: Diagnosis not present

## 2022-09-17 DIAGNOSIS — L409 Psoriasis, unspecified: Secondary | ICD-10-CM | POA: Diagnosis not present

## 2022-09-17 DIAGNOSIS — Z79899 Other long term (current) drug therapy: Secondary | ICD-10-CM | POA: Diagnosis not present

## 2022-09-17 DIAGNOSIS — Z794 Long term (current) use of insulin: Secondary | ICD-10-CM | POA: Insufficient documentation

## 2022-09-17 DIAGNOSIS — E109 Type 1 diabetes mellitus without complications: Secondary | ICD-10-CM | POA: Diagnosis present

## 2022-09-17 DIAGNOSIS — L80 Vitiligo: Secondary | ICD-10-CM | POA: Diagnosis not present

## 2022-09-17 DIAGNOSIS — E1065 Type 1 diabetes mellitus with hyperglycemia: Secondary | ICD-10-CM

## 2022-09-17 LAB — POCT GLYCOSYLATED HEMOGLOBIN (HGB A1C): HbA1c, POC (controlled diabetic range): 11.8 % — AB (ref 0.0–7.0)

## 2022-09-17 MED ORDER — CLOBETASOL PROPIONATE 0.05 % EX CREA
1.0000 | TOPICAL_CREAM | Freq: Two times a day (BID) | CUTANEOUS | 3 refills | Status: DC
  Filled 2022-09-17: qty 30, 30d supply, fill #0

## 2022-09-17 MED ORDER — BASAGLAR KWIKPEN 100 UNIT/ML ~~LOC~~ SOPN
18.0000 [IU] | PEN_INJECTOR | Freq: Every day | SUBCUTANEOUS | 3 refills | Status: DC
  Filled 2022-09-17: qty 15, 83d supply, fill #0
  Filled 2023-01-09: qty 15, 83d supply, fill #1

## 2022-09-17 MED ORDER — PANTOPRAZOLE SODIUM 40 MG PO TBEC
DELAYED_RELEASE_TABLET | Freq: Every day | ORAL | 1 refills | Status: DC
  Filled 2022-09-17: qty 90, 90d supply, fill #0
  Filled 2023-01-09: qty 90, 90d supply, fill #1

## 2022-09-17 MED ORDER — INSULIN ASPART 100 UNIT/ML IJ SOLN
0.0000 [IU] | Freq: Three times a day (TID) | INTRAMUSCULAR | 3 refills | Status: DC
  Filled 2022-09-17: qty 10, 28d supply, fill #0
  Filled 2023-01-09 (×2): qty 10, 28d supply, fill #1

## 2022-09-17 NOTE — Patient Instructions (Signed)

## 2022-09-17 NOTE — Progress Notes (Signed)
Dermatology referral in [employer]. Heartburn needs refill on medication

## 2022-09-17 NOTE — Progress Notes (Signed)
Subjective:  Patient ID: Jared Murray, male    DOB: 10/05/1975  Age: 47 y.o. MRN: [id number]  CC: Diabetes   HPI Jared Murray is a 47 y.o. year old male with a history of type 1 diabetes mellitus (A1c 11.8), avascular necrosis of both hips, GERD, and Vitiligo, psoriasis who presents today for a follow up visit.    Interval History:  He needs a new Dermatologist as his current Dermatologist is in [place] which is too far. He has not been using his clobetasol for his psoriasis and is requesting refills.  I had referred him to Endocrine last year but he is yet to see them.  Note in epic reviewed [facility] do not accept his insurance and he was referred to [place] but he states he never heard from them.  Endorses taking 14 units of Basaglar rather than 18 units which appears on his med list and he has been administering NovoLog 4 units 3 times daily with meals.  In the past he has had hypoglycemia.  He decreased his Basaglar dose. He has been having scabs on his arm when he uses his Decxom Sugars are in the 200- 400s, lowest was around 150.  He is requesting his PPI for reflux symptoms. Past Medical History:  Diagnosis Date   Acute pericarditis 03/31/2016   AKI (acute kidney injury) ([place]) 01/22/2015   Allergy    Anxiety    per pt   Arthritis    Avascular necrosis ([place]) 02/12/2015   Avascular necrosis of bone of hip ([place])    Chest wall abscess 04/03/2015   Chronic diarrhea of unknown origin 09/10/2017   Cutaneous abscess of chest wall    Depression    per pt   DKA (diabetic ketoacidoses)    DKA, type 1, not at goal [facility]) 04/03/2015   Eczema    Elevated liver enzymes 11/27/2018   GERD (gastroesophageal reflux disease)    HLD (hyperlipidemia) 11/25/2018   no meds   Hyperbilirubinemia    Hyperglycemia due to type 1 diabetes mellitus (HCC)    Hyperkalemia    Hypokalemia 03/17/2013   Left hip pain 04/18/2014   Loss of weight 03/17/2013   Marijuana use  11/25/2018   Osteoarthritis 12/24/2016   Prolonged QT interval 11/27/2018   in setting of RBBB and hypokalemia   Protein-calorie malnutrition, severe ([place]) 03/17/2013   Psoriasis    RBBB 12/23/2018   Chronic since at least 2014   Sepsis ([place]) 01/22/2015   Substance abuse ([place])    Tobacco use disorder 09/13/2015   Type 1 diabetes mellitus ([place])     Past Surgical History:  Procedure Laterality Date   COLONOSCOPY  2020   COLONOSCOPY WITH PROPOFOL N/A 05/07/2020   Procedure: COLONOSCOPY WITH PROPOFOL;  Surgeon: Irving Copas., MD;  Location: [facility];  Service: Gastroenterology;  Laterality: N/A;   ENDOSCOPIC MUCOSAL RESECTION N/A 05/07/2020   Procedure: ENDOSCOPIC MUCOSAL RESECTION;  Surgeon: Rush Landmark Telford Nab., MD;  Location: [facility];  Service: Gastroenterology;  Laterality: N/A;   FINGER SURGERY Right    middle finger   INCISION AND DRAINAGE ABSCESS Left 01/07/2022   Procedure: IRRIGATION AND DEBRIDEMENT OF LEFT HAND;  Surgeon: Marchia Bond, MD;  Location: [facility];  Service: Orthopedics;  Laterality: Left;   SUBMUCOSAL LIFTING INJECTION  05/07/2020   Procedure: SUBMUCOSAL LIFTING INJECTION;  Surgeon: Rush Landmark Telford Nab., MD;  Location: [facility];  Service: Gastroenterology;;    Family History  Problem Relation Age of Onset  Hypertension Father    Diabetes Other        multiple   Lupus Cousin    Stroke Maternal Grandmother    Stroke Paternal Grandmother    Colon cancer Neg Hx    Stomach cancer Neg Hx    Rectal cancer Neg Hx    Esophageal cancer Neg Hx    Colon polyps Neg Hx     Social History   Socioeconomic History   Marital status: Married    Spouse name: Not on file   Number of children: Not on file   Years of education: Not on file   Highest education level: Not on file  Occupational History   Not on file  Tobacco Use   Smoking status: Former    Packs/day: 0.25    Years: 10.00    Total pack years: 2.50    Types: Cigarettes    Quit date:  11/26/2018    Years since quitting: 3.8   Smokeless tobacco: Never  Vaping Use   Vaping Use: Never used  Substance and Sexual Activity   Alcohol use: Not Currently    Alcohol/week: 1.0 - 2.0 standard drink of alcohol    Types: 1 - 2 Shots of liquor per week    Comment: none since 2020   Drug use: Yes    Types: Marijuana   Sexual activity: Yes  Other Topics Concern   Not on file  Social History Narrative   Lives in [place] - works as a Education officer, museum   Married   Social Determinants of Radio broadcast assistant Strain: Low Risk  (04/04/2022)   Overall Financial Resource Strain (CARDIA)    Difficulty of Paying Living Expenses: Not very hard  Food Insecurity: No Food Insecurity (04/04/2022)   Hunger Vital Sign    Worried About Running Out of Food in the Last Year: Never true    Ran Out of Food in the Last Year: Never true  Transportation Needs: No Transportation Needs (04/04/2022)   PRAPARE - Hydrologist (Medical): No    Lack of Transportation (Non-Medical): No  Physical Activity: Insufficiently Active (04/04/2022)   Exercise Vital Sign    Days of Exercise per Week: 7 days    Minutes of Exercise per Session: 20 min  Stress: No Stress Concern Present (04/04/2022)   [place]    Feeling of Stress : Not at all  Social Connections: Moderately Integrated (04/04/2022)   Social Connection and Isolation Panel [NHANES]    Frequency of Communication with Friends and Family: Twice a week    Frequency of Social Gatherings with Friends and Family: Once a week    Attends Religious Services: 1 to 4 times per year    Active Member of [employer] or Organizations: No    Attends Music therapist: Never    Marital Status: Married    Allergies  Allergen Reactions   Bee Venom Swelling   Latex Itching and Rash   Penicillins Other (See Comments)    Childhood allergy Has patient had a PCN  reaction causing immediate rash, facial/tongue/throat swelling, SOB or lightheadedness with hypotension: NO Has patient had a PCN reaction causing severe rash involving mucus membranes or skin necrosis:NO Has patient had a PCN reaction that required hospitalization NO Has patient had a PCN reaction occurring within the last 10 years: NO If all of the above answers are "NO", then may proceed with Cephalosporin use.  Outpatient Medications Prior to Visit  Medication Sig Dispense Refill   Accu-Chek Softclix Lancets lancets Use to check blood sugar three times daily. E10.65 100 each 0   aspirin-sod bicarb-citric acid (ALKA-SELTZER) 325 MG TBEF tablet Take 325 mg by mouth every 6 (six) hours as needed (heartburn).     atorvastatin (LIPITOR) 20 MG tablet TAKE 1 TABLET (20 MG TOTAL) BY MOUTH DAILY. (Patient taking differently: Take 20 mg by mouth daily.) 30 tablet 5   Blood Glucose Monitoring Suppl (ACCU-CHEK GUIDE) w/Device KIT Use to check blood sugar 3 times daily. E10.65 1 kit 0   glucose blood (ACCU-CHEK GUIDE) test strip Use to check blood sugar three times daily. E10.65 100 each 0   INSULIN SYRINGE 1CC/29G 29G X 1/2" 1 ML MISC use as directed (Patient taking differently: 1 each by Other route See admin instructions. use as directed) 100 each 2   Tetrahydrozoline HCl (VISINE OP) Place 1 drop into both eyes daily as needed (dryness / allergies).     insulin aspart (NOVOLOG) 100 UNIT/ML injection Inject 4 Units into the skin 3 (three) times daily with meals. If blood sugar before meals is above 350, use 6 units. (Patient taking differently: Inject 4 Units into the skin 3 (three) times daily with meals. If blood sugar before meals is above 250, use 4 units. Over 350 take 6 units.  If sugar is under 150 >> no insulin) 10 mL 2   Insulin Glargine (BASAGLAR KWIKPEN) 100 UNIT/ML Inject 18 Units into the skin at bedtime. (Patient taking differently: Inject 15 Units into the skin at bedtime.) 15 mL 3    pantoprazole (PROTONIX) 40 MG tablet TAKE 1 TABLET (40 MG TOTAL) BY MOUTH DAILY. 30 tablet 3   amoxicillin-clavulanate (AUGMENTIN) 875-125 MG tablet Take 1 tablet by mouth 2 (two) times daily. (Patient not taking: Reported on 04/04/2022) 20 tablet 0   oxyCODONE (ROXICODONE) 5 MG immediate release tablet Take 1 tablet (5 mg total) by mouth every 8 (eight) hours as needed. (Patient not taking: Reported on 04/04/2022) 20 tablet 0   sildenafil (VIAGRA) 50 MG tablet Take 1 tablet (50 mg total) by mouth daily as needed for erectile dysfunction (At least 24 hours between doses). (Patient not taking: Reported on 01/04/2022) 10 tablet 1   No facility-administered medications prior to visit.     ROS Review of Systems  Constitutional:  Negative for activity change and appetite change.  HENT:  Negative for sinus pressure and sore throat.   Respiratory:  Negative for chest tightness, shortness of breath and wheezing.   Cardiovascular:  Negative for chest pain and palpitations.  Gastrointestinal:  Negative for abdominal distention, abdominal pain and constipation.  Genitourinary: Negative.   Musculoskeletal: Negative.   Skin:  Positive for rash.  Psychiatric/Behavioral:  Negative for behavioral problems and dysphoric mood.     Objective:  BP 106/69   Pulse 87   Temp 98 F (36.7 C) (Oral)   Ht '5\' 10"'$  (1.778 m)   Wt 130 lb 6.4 oz (59.1 kg)   SpO2 100%   BMI 18.71 kg/m      09/17/2022    3:57 PM 01/09/2022    2:00 PM 01/09/2022    5:43 AM  BP/Weight  Systolic BP 101 751 025  Diastolic BP 69 75 91  Wt. (Lbs) 130.4    BMI 18.71 kg/m2        Physical Exam Constitutional:      Appearance: He is well-developed.  Cardiovascular:  Rate and Rhythm: Normal rate.     Heart sounds: Normal heart sounds. No murmur heard. Pulmonary:     Effort: Pulmonary effort is normal.     Breath sounds: Normal breath sounds. No wheezing or rales.  Chest:     Chest wall: No tenderness.  Abdominal:      General: Bowel sounds are normal. There is no distension.     Palpations: Abdomen is soft. There is no mass.     Tenderness: There is no abdominal tenderness.  Musculoskeletal:        General: Normal range of motion.     Right lower leg: No edema.     Left lower leg: No edema.  Skin:    Comments: Psoriatic rash and  Vitiliginous rash widely distributed on body surface  Neurological:     Mental Status: He is alert and oriented to person, place, and time.  Psychiatric:        Mood and Affect: Mood normal.        Latest Ref Rng & Units 01/08/2022    3:12 AM 01/07/2022    3:01 AM 01/06/2022    3:22 AM  CMP  Glucose 70 - 99 mg/dL 431  286  93   BUN 6 - 20 mg/dL 17  8  <5   Creatinine 0.61 - 1.24 mg/dL 1.14  0.94  0.85   Sodium 135 - 145 mmol/L 128  131  135   Potassium 3.5 - 5.1 mmol/L 4.9  4.9  3.2   Chloride 98 - 111 mmol/L 95  99  103   CO2 22 - 32 mmol/L '23  24  26   '$ Calcium 8.9 - 10.3 mg/dL 8.3  8.3  7.9   Total Protein 6.5 - 8.1 g/dL 5.7  5.6  5.5   Total Bilirubin 0.3 - 1.2 mg/dL 0.8  1.1  0.8   Alkaline Phos 38 - 126 U/L 91  88  84   AST 15 - 41 U/L '24  24  21   '$ ALT 0 - 44 U/L '20  18  18     '$ Lipid Panel     Component Value Date/Time   CHOL 107 12/19/2021 1215   TRIG 37 12/19/2021 1215   HDL 38 (L) 12/19/2021 1215   CHOLHDL 2.9 12/18/2020 1104   CHOLHDL 2.3 12/19/2015 0947   VLDL 12 12/19/2015 0947   LDLCALC 59 12/19/2021 1215    CBC    Component Value Date/Time   WBC 5.1 01/08/2022 0312   RBC 3.55 (L) 01/08/2022 0312   HGB 9.8 (L) 01/08/2022 0312   HGB 12.4 (L) 12/16/2018 1444   HCT 29.9 (L) 01/08/2022 0312   HCT 36.8 (L) 12/16/2018 1444   PLT 387 01/08/2022 0312   PLT 299 12/16/2018 1444   MCV 84.2 01/08/2022 0312   MCV 89 12/16/2018 1444   MCH 27.6 01/08/2022 0312   MCHC 32.8 01/08/2022 0312   RDW 13.1 01/08/2022 0312   RDW 13.0 12/16/2018 1444   LYMPHSABS 2,095 12/30/2019 1651   LYMPHSABS 2.1 12/16/2018 1444   MONOABS 0.4 11/25/2018 0352    EOSABS 170 12/30/2019 1651   EOSABS 0.2 12/16/2018 1444   BASOSABS 50 12/30/2019 1651   BASOSABS 0.0 12/16/2018 1444    Lab Results  Component Value Date   HGBA1C 11.8 (A) 09/17/2022    Assessment & Plan:  1. Type 1 diabetes mellitus with hyperglycemia (HCC) Uncontrolled with A1c of 11.8, goal is less than 7.0 Unfortunately medication adherence  and adherence with a diabetic diet has been a challenge Previously [facility] by [facility] then he was lost to follow-up I referred him to endocrine 8 months ago and he never got to see them I will place a referral again He will benefit from having an insulin pump He is unable to continue using his CGM due to scabs on his arm Advised to increase his Basaglar from 14 units to 18 units and I will place him on a sliding scale of NovoLog rather than fixed regimen to prevent hypoglycemia For blood sugars 0-150 give 0 units of insulin, 151-200 give 2 units of insulin, 201-250 give 4 units, 251-300 give 6 units, 301-350 give 8 units, 351-400 give 10 units,> 400 give 12 units and call M.D. Discussed hypoglycemia protocol. Counseled on Diabetic diet, my plate method, 174 minutes of moderate intensity exercise/week Blood sugar logs with fasting goals of 80-120 mg/dl, random of less than 180 and in the event of sugars less than 60 mg/dl or greater than 400 mg/dl encouraged to notify the clinic. Advised on the need for annual eye exams, annual foot exams, Pneumonia vaccine.  - POCT glycosylated hemoglobin (Hb A1C) - Ambulatory referral to Endocrinology - insulin aspart (NOVOLOG) 100 UNIT/ML injection; Inject 0-12 Units into the skin 3 (three) times daily with meals. As per sliding scale  Dispense: 10 mL; Refill: 3 - Insulin Glargine (BASAGLAR KWIKPEN) 100 UNIT/ML; Inject 18 Units into the skin at bedtime.  Dispense: 15 mL; Refill: 3 - CMP14+EGFR  2. Gastroesophageal reflux disease without esophagitis Controlled - pantoprazole (PROTONIX) 40 MG  tablet; TAKE 1 TABLET (40 MG TOTAL) BY MOUTH DAILY.  Dispense: 90 tablet; Refill: 1  3. Psoriasis Uncontrolled Refill clobetasol - Ambulatory referral to Dermatology - clobetasol cream (TEMOVATE) 0.05 %; Apply 1 Application topically 2 (two) times daily.  Dispense: 30 g; Refill: 3    Meds ordered this encounter  Medications   pantoprazole (PROTONIX) 40 MG tablet    Sig: TAKE 1 TABLET (40 MG TOTAL) BY MOUTH DAILY.    Dispense:  90 tablet    Refill:  1   clobetasol cream (TEMOVATE) 0.05 %    Sig: Apply 1 Application topically 2 (two) times daily.    Dispense:  30 g    Refill:  3   insulin aspart (NOVOLOG) 100 UNIT/ML injection    Sig: Inject 0-12 Units into the skin 3 (three) times daily with meals. As per sliding scale    Dispense:  10 mL    Refill:  3    For blood sugars 0-150 give 0 units of insulin, 151-200 give 2 units of insulin, 201-250 give 4 units, 251-300 give 6 units, 301-350 give 8 units, 351-400 give 10 units,> 400 give 12 units and call M.D.   Insulin Glargine (BASAGLAR KWIKPEN) 100 UNIT/ML    Sig: Inject 18 Units into the skin at bedtime.    Dispense:  15 mL    Refill:  3    Follow-up: Return in about 3 months (around 12/16/2022) for DM.       Charlott Rakes, MD, FAAFP. [facility] [place], [place]   09/17/2022, 6:03 PM

## 2022-09-18 ENCOUNTER — Other Ambulatory Visit: Payer: Self-pay

## 2022-09-18 LAB — CMP14+EGFR
ALT: 37 IU/L (ref 0–44)
AST: 29 IU/L (ref 0–40)
Albumin/Globulin Ratio: 1.6 (ref 1.2–2.2)
Albumin: 4.4 g/dL (ref 4.1–5.1)
Alkaline Phosphatase: 166 IU/L — ABNORMAL HIGH (ref 44–121)
BUN/Creatinine Ratio: 10 (ref 9–20)
BUN: 12 mg/dL (ref 6–24)
Bilirubin Total: 0.7 mg/dL (ref 0.0–1.2)
CO2: 21 mmol/L (ref 20–29)
Calcium: 9.3 mg/dL (ref 8.7–10.2)
Chloride: 102 mmol/L (ref 96–106)
Creatinine, Ser: 1.16 mg/dL (ref 0.76–1.27)
Globulin, Total: 2.8 g/dL (ref 1.5–4.5)
Glucose: 224 mg/dL — ABNORMAL HIGH (ref 70–99)
Potassium: 4.5 mmol/L (ref 3.5–5.2)
Sodium: 138 mmol/L (ref 134–144)
Total Protein: 7.2 g/dL (ref 6.0–8.5)
eGFR: 79 mL/min/{1.73_m2} (ref >59)

## 2022-09-19 ENCOUNTER — Other Ambulatory Visit: Payer: Self-pay

## 2022-09-26 ENCOUNTER — Other Ambulatory Visit: Payer: Self-pay

## 2022-09-26 ENCOUNTER — Other Ambulatory Visit: Payer: Self-pay | Admitting: Pharmacist

## 2022-09-26 ENCOUNTER — Other Ambulatory Visit: Payer: Self-pay | Admitting: Family Medicine

## 2022-09-26 MED ORDER — NOVOLOG FLEXPEN 100 UNIT/ML ~~LOC~~ SOPN
PEN_INJECTOR | SUBCUTANEOUS | 3 refills | Status: DC
  Filled 2022-09-26 – 2023-01-09 (×2): qty 15, 42d supply, fill #0

## 2022-09-26 MED ORDER — INSULIN LISPRO (1 UNIT DIAL) 100 UNIT/ML (KWIKPEN)
0.0000 [IU] | PEN_INJECTOR | Freq: Three times a day (TID) | SUBCUTANEOUS | 11 refills | Status: DC
  Filled 2022-09-26: qty 15, 42d supply, fill #0

## 2022-10-01 ENCOUNTER — Ambulatory Visit: Payer: Self-pay

## 2022-10-01 NOTE — Patient Outreach (Signed)
  Care Coordination   Initial Visit Note   10/01/2022 Name: Jared Murray MRN: [id number] DOB: 03-28-1976  Jared Murray is a 47 y.o. year old male who sees Charlott Rakes, MD for primary care. I spoke with  Jared Murray by phone today.  What matters to the patients health and wellness today?  To better manage diabetes    Goals Addressed             This Visit's Progress    COMPLETED: Care Coordination Activities       Care Coordination Interventions: Educated the patient on the role of the care coordination team; patient agreeable to speak with RN Care Manager regarding disease management of DM I Performed chart review to note recent A1C of 11.8 with referral to Endocrinology Determined the patient has yet to receive a call from Endocrinology Collaboration with referral coordinator advising patient has not yet received an Endocrinology appointment Collaboration with RN Care Manager to advise of patient enrollment into Care Coordination program for disease management         SDOH assessments and interventions completed:  No     Care Coordination Interventions:  Yes, provided   Interventions Today    Flowsheet Row Most Recent Value  Chronic Disease   Chronic disease during today's visit Diabetes  General Interventions   General Interventions Discussed/Reviewed Referral to Nurse, Communication with  [Scheduled tp speak with [place]  Communication with PCP/Specialists  [Referral Coordinator to advise patient has not received call from Endocrinology]       Follow up plan: Follow up call scheduled for 3/1    Encounter Outcome:  Pt. Visit Completed   Daneen Schick, Arita Miss, CDP Social Worker, Certified Dementia Practitioner [place] Coordination [phone]

## 2022-10-01 NOTE — Patient Instructions (Signed)
Visit Information  Thank you for taking time to visit with me today. Please don't hesitate to contact me if I can be of assistance to you.   Following are the goals we discussed today:   Goals Addressed             This Visit's Progress    COMPLETED: Care Coordination Activities       Care Coordination Interventions: Educated the patient on the role of the care coordination team; patient agreeable to speak with RN Care Manager regarding disease management of DM I Performed chart review to note recent A1C of 11.8 with referral to Endocrinology Determined the patient has yet to receive a call from Endocrinology Collaboration with referral coordinator advising patient has not yet received an Endocrinology appointment Collaboration with RN Care Manager to advise of patient enrollment into Care Coordination program for disease management         Your next appointment is by telephone on 3/1 at 11:00 am with [place].  Please call the care guide team at [phone] if you need to cancel or reschedule your appointment.   If you are experiencing a Mental Health or [place] or need someone to talk to, please call 911  Patient verbalizes understanding of instructions and care plan provided today and agrees to view in [place]. Active MyChart status and patient understanding of how to access instructions and care plan via MyChart confirmed with patient.     Telephone follow up appointment with care management team member scheduled for:10/17/22  Daneen Schick, Arita Miss, CDP Social Worker, Certified Dementia Practitioner [place] [facility]  Care Coordination [phone]

## 2022-10-02 ENCOUNTER — Other Ambulatory Visit: Payer: Self-pay

## 2022-10-03 ENCOUNTER — Other Ambulatory Visit: Payer: Self-pay

## 2022-10-03 ENCOUNTER — Other Ambulatory Visit: Payer: Self-pay | Admitting: Family Medicine

## 2022-10-03 MED ORDER — INSULIN LISPRO (1 UNIT DIAL) 100 UNIT/ML (KWIKPEN)
0.0000 [IU] | PEN_INJECTOR | Freq: Three times a day (TID) | SUBCUTANEOUS | 6 refills | Status: DC
  Filled 2022-10-03 – 2023-01-09 (×2): qty 15, 42d supply, fill #0

## 2022-10-17 ENCOUNTER — Ambulatory Visit: Payer: Self-pay

## 2022-10-17 NOTE — Patient Outreach (Signed)
  Care Coordination   10/17/2022 Name: Jared Murray MRN: [id number] DOB: 1976-03-15   Care Coordination Outreach Attempts:  An unsuccessful telephone outreach was attempted for a scheduled appointment today.  Follow Up Plan:  Additional outreach attempts will be made to offer the patient care coordination information and services.   Encounter Outcome:  No Answer   Care Coordination Interventions:  No, not indicated    Barb Merino, RN, BSN, CCM Care Management Coordinator [facility] Care Management  Direct Phone: [phone]

## 2022-11-06 ENCOUNTER — Ambulatory Visit: Payer: Self-pay

## 2022-11-06 ENCOUNTER — Telehealth: Payer: Self-pay

## 2022-11-06 DIAGNOSIS — L409 Psoriasis, unspecified: Secondary | ICD-10-CM

## 2022-11-06 DIAGNOSIS — E1065 Type 1 diabetes mellitus with hyperglycemia: Secondary | ICD-10-CM

## 2022-11-06 NOTE — Telephone Encounter (Signed)
Dr Margarita Rana and Alinda Sierras,   I received the following requests from [facility], RN/THN  a Podiatry referral. His A1c is >11 and he is not established with Podiatry.   He also has Psoriasis and needs a Dermatologist.   Dr. Margarita Rana placed a referral for Endocrinology on 09/17/22 and the patient has not been contacted by the provider. Can we send the referral to someone else at this point? Your referral coordinator checked on this not long ago and stated she was advised someone would be contacting the patient soon. It's going on 2 months and still no contact.      Alinda Sierras- I see that [facility] tried to reach him on 2/8,2/28 and 3/1.  They left messages and he never called back.  Do we need to make another referral?  As far as endocrinology, you spoke to their office on 2/16.  I know that the wait for appointments can sometimes take months. Are there any other options for him?   Thanks so much!

## 2022-11-06 NOTE — Patient Outreach (Signed)
Care Coordination   Initial Visit Note   11/06/2022 Name: Jared Murray MRN: [id number] DOB: 1976-05-20  Jared Murray is a 47 y.o. year old male who sees Charlott Rakes, MD for primary care. I spoke with  Jared Murray by phone today.  What matters to the patients health and wellness today?  Patient would like to work on lowering his A1c <7.5 % to help improve his overall health.     Goals Addressed             This Visit's Progress    To improve hip pain       Care Coordination Interventions: Reviewed provider established plan for pain management Discussed importance of adherence to all scheduled medical appointments Counseled on the importance of reporting any/all new or changed pain symptoms or management strategies to pain management provider Advised patient to report to care team affect of pain on daily activities Reviewed with patient prescribed pharmacological and nonpharmacological pain relief strategies Assessed social determinant of health barriers Educated patient about PREP (provider referral exercise program), mailed brochure to patient for review and consideration        To lower A1c <7.5%       Care Coordination Interventions: Provided education to patient about basic DM disease process Reviewed medications with patient and discussed importance of medication adherence Counseled on importance of regular laboratory monitoring as prescribed Provided patient with written educational materials related to hypo and hyperglycemia and importance of correct treatment Reviewed scheduled/upcoming provider appointments including: Eye Exam scheduled for 11/24/22; next scheduled PCP visit scheduled for 12/16/22 @3 :30 PM with Dr. Margarita Rana  Advised patient, providing education and rationale, to check cbg daily before meals and at bedtime and record, calling PCP for findings outside established parameters Review of patient status, including review of consultants reports,  relevant laboratory and other test results, and medications completed Determined patient has not been contacted by Endocrinologist previously placed by PCP on 09/17/22 Educated patient about the importance of establishment with Podiatry, patient would like referral for diabetic foot exam and Psoriasis Sent secure email to [employer]	[place] educational materials related to Diabetes Management        Interventions Today    Flowsheet Row Most Recent Value  Chronic Disease   Chronic disease during today's visit Diabetes, Other  [bilateral hip pain]  General Interventions   General Interventions Discussed/Reviewed General Interventions Discussed, General Interventions Reviewed, Annual Eye Exam, Annual Foot Exam, Labs, Durable Medical Equipment (DME), Doctor Visits  Labs Kidney Function  Doctor Visits Discussed/Reviewed Doctor Visits Discussed, Doctor Visits Reviewed, PCP, Specialist  Durable Medical Equipment (DME) Other  [cane]  Exercise Interventions   Exercise Discussed/Reviewed Exercise Discussed, Exercise Reviewed, Physical Activity  Physical Activity Discussed/Reviewed PREP, Physical Activity Discussed, Physical Activity Reviewed  Education Interventions   Education Provided Provided Education, Provided Printed Education  Provided Verbal Education On Nutrition, Foot Care, Eye Care, Labs, Blood Sugar Monitoring, Exercise, Medication, When to see the doctor  Labs Reviewed Hgb A1c, Kidney Function  Nutrition Interventions   Nutrition Discussed/Reviewed Nutrition Discussed, Nutrition Reviewed, Carbohydrate meal planning, Portion sizes  Pharmacy Interventions   Pharmacy Dicussed/Reviewed Medications and their functions         SDOH assessments and interventions completed:  Yes  SDOH Interventions Today    Flowsheet Row Most Recent Value  SDOH Interventions   Transportation Interventions Intervention Not Indicated        Care Coordination Interventions:  Yes,  provided   Follow up  plan: Follow up call scheduled for 11/20/22 @11 :00 AM    Encounter Outcome:  Pt. Visit Completed

## 2022-11-06 NOTE — Patient Instructions (Signed)
Visit Information  Thank you for taking time to visit with me today. Please don't hesitate to contact me if I can be of assistance to you.   Following are the goals we discussed today:   Goals Addressed             This Visit's Progress    To improve hip pain       Care Coordination Interventions: Reviewed provider established plan for pain management Discussed importance of adherence to all scheduled medical appointments Counseled on the importance of reporting any/all new or changed pain symptoms or management strategies to pain management provider Advised patient to report to care team affect of pain on daily activities Reviewed with patient prescribed pharmacological and nonpharmacological pain relief strategies Assessed social determinant of health barriers Educated patient about PREP (provider referral exercise program), mailed brochure to patient for review and consideration        To lower A1c <7.5%       Care Coordination Interventions: Provided education to patient about basic DM disease process Reviewed medications with patient and discussed importance of medication adherence Counseled on importance of regular laboratory monitoring as prescribed Provided patient with written educational materials related to hypo and hyperglycemia and importance of correct treatment Reviewed scheduled/upcoming provider appointments including: Eye Exam scheduled for 11/24/22; next scheduled PCP visit scheduled for 12/16/22 @3 :30 PM with Dr. Margarita Rana  Advised patient, providing education and rationale, to check cbg daily before meals and at bedtime and record, calling PCP for findings outside established parameters Review of patient status, including review of consultants reports, relevant laboratory and other test results, and medications completed Determined patient has not been contacted by Endocrinologist previously placed by PCP on 09/17/22 Educated patient about the importance of  establishment with Podiatry, patient would like referral for diabetic foot exam and Psoriasis Sent secure email to [facility]  Mailed printed educational materials related to Diabetes Management            Our next appointment is by telephone on 11/20/22 at 11:00 AM  Please call the care guide team at [phone] if you need to cancel or reschedule your appointment.   If you are experiencing a Mental Health or [place] or need someone to talk to, please call 1-800-273-TALK (toll free, 24 hour hotline) go to [facility] [address] [place] [phone])  Patient verbalizes understanding of instructions and care plan provided today and agrees to view in [place]. Active MyChart status and patient understanding of how to access instructions and care plan via MyChart confirmed with patient.     Barb Merino, RN, BSN, CCM Care Management Coordinator [facility] Direct Phone: [phone]

## 2022-11-08 NOTE — Telephone Encounter (Signed)
I have placed Podiatry referral and Alinda Sierras will follow up on the others. Thanks

## 2022-11-13 NOTE — Telephone Encounter (Signed)
Referral placed.

## 2022-11-20 ENCOUNTER — Ambulatory Visit: Payer: Self-pay

## 2022-11-20 NOTE — Patient Instructions (Signed)
Visit Information  Thank you for taking time to visit with me today. Please don't hesitate to contact me if I can be of assistance to you.   Following are the goals we discussed today:   Goals Addressed             This Visit's Progress    To lower A1c <7.5%       Care Coordination Interventions: Confirmed patient received the diabetes educational materials previously sent, he has not had time to review them but plans to do so soon Determined patient has not received a call from Endocrinology at [facility] outbound call to [facility] at [employer], spoke with [facility] who advised the referral coordinator is not available to assist with scheduling, she confirmed the referral has been accepted and is ready to schedule [name] advised she will request for the referral coordinator to contact the patient by the end of the day to assist with scheduling his new patient appointment Advised the referral was placed on 09/17/22 and patient's A1c is up to 11.8, advised it is crucial for patient to get scheduled at this point ASAP, she made note for the referral coordinator to review Provided patient with the contact number for [facility] and encouraged him to contact this provider tomorrow am if he does not receive a call back today and he is agreeable  Reviewed upcoming Podiatry appointment scheduled with Dr. Prudence Davidson for 11/26/22 @09 :15 AM, provided patient with the contact number/address for this provider        To schedule appointment with Dermatology for evaluation of psoriasis       Care Coordination Interventions: Evaluation of current treatment plan related to Psoriasis and patient's adherence to plan as established by provider Reviewed with patient PCP referral placed for Dermatology to evaluate and treat Psoriasis Determined referral has been authorized and ready to schedule Provided patient with the contact number and location and encouraged him to call to schedule  this appointment when ready, and he is agreeable            Our next appointment is by telephone on 12/23/22 at 11:30 AM  Please call the care guide team at [phone] if you need to cancel or reschedule your appointment.   If you are experiencing a Mental Health or [place] or need someone to talk to, please call 1-800-273-TALK (toll free, 24 hour hotline) go to [facility] [address] [place] [phone])  Patient verbalizes understanding of instructions and care plan provided today and agrees to view in [place]. Active MyChart status and patient understanding of how to access instructions and care plan via MyChart confirmed with patient.     Barb Merino, RN, BSN, CCM Care Management Coordinator [facility] Care Management Direct Phone: [phone]

## 2022-11-20 NOTE — Patient Outreach (Signed)
  Care Coordination   Follow Up Visit Note   11/20/2022 Name: Jared Murray MRN: [id number] DOB: [birth year]  Jared Murray is a 47 y.o. year old male who sees Charlott Rakes, MD for primary care. I spoke with  Jared Murray by phone today.  What matters to the patients health and wellness today?  Patient will contact the Dermatologist to schedule a new patient appointment. He will contact Endocrinology tomorrow am if he does not receive a call from this provider later today.     Goals Addressed             This Visit's Progress    To lower A1c <7.5%       Care Coordination Interventions: Confirmed patient received the diabetes educational materials previously sent, he has not had time to review them but plans to do so soon Determined patient has not received a call from Endocrinology at [facility] outbound call to [facility] Endocrinology at [employer], spoke with [facility] who advised the referral coordinator is not available to assist with scheduling, she confirmed the referral has been accepted and is ready to schedule [name] advised she will request for the referral coordinator to contact the patient by the end of the day to assist with scheduling his new patient appointment Advised the referral was placed on 09/17/22 and patient's A1c is up to 11.8, advised it is crucial for patient to get scheduled at this point ASAP, she made note for the referral coordinator to review Provided patient with the contact number for [facility] and encouraged him to contact this provider tomorrow am if he does not receive a call back today and he is agreeable  Reviewed upcoming Podiatry appointment scheduled with Dr. Prudence Davidson for 11/26/22 @09 :15 AM, provided patient with the contact number/address for this provider        To schedule appointment with Dermatology for evaluation of psoriasis       Care Coordination Interventions: Evaluation of current treatment plan related to  Psoriasis and patient's adherence to plan as established by provider Reviewed with patient PCP referral placed for Dermatology to evaluate and treat Psoriasis Determined referral has been authorized and ready to schedule Provided patient with the contact number and location and encouraged him to call to schedule this appointment when ready, and he is agreeable         Interventions Today    Flowsheet Row Most Recent Value  Chronic Disease   Chronic disease during today's visit Diabetes, Other  [Psoriasis]  General Interventions   General Interventions Discussed/Reviewed General Interventions Discussed, General Interventions Reviewed, Labs, Doctor Visits, Communication with  Doctor Visits Discussed/Reviewed Doctor Visits Discussed, Doctor Visits Reviewed, PCP, Specialist  Communication with PCP/Specialists  Education Interventions   Education Provided Provided Education  Provided Verbal Education On Labs, When to see the doctor  Labs Reviewed Hgb A1c            SDOH assessments and interventions completed:  No     Care Coordination Interventions:  Yes, provided   Follow up plan: Follow up call scheduled for 12/23/22 @11 :30 AM    Encounter Outcome:  Pt. Visit Completed

## 2022-11-24 DIAGNOSIS — H25013 Cortical age-related cataract, bilateral: Secondary | ICD-10-CM | POA: Diagnosis not present

## 2022-11-24 DIAGNOSIS — H2513 Age-related nuclear cataract, bilateral: Secondary | ICD-10-CM | POA: Diagnosis not present

## 2022-11-24 DIAGNOSIS — E109 Type 1 diabetes mellitus without complications: Secondary | ICD-10-CM | POA: Diagnosis not present

## 2022-11-24 LAB — HM DIABETES EYE EXAM

## 2022-11-26 ENCOUNTER — Encounter: Payer: Self-pay | Admitting: Podiatry

## 2022-11-26 ENCOUNTER — Ambulatory Visit (INDEPENDENT_AMBULATORY_CARE_PROVIDER_SITE_OTHER): Payer: Medicare Other | Admitting: Podiatry

## 2022-11-26 DIAGNOSIS — E1065 Type 1 diabetes mellitus with hyperglycemia: Secondary | ICD-10-CM

## 2022-11-26 DIAGNOSIS — M79675 Pain in left toe(s): Secondary | ICD-10-CM

## 2022-11-26 DIAGNOSIS — L309 Dermatitis, unspecified: Secondary | ICD-10-CM | POA: Diagnosis not present

## 2022-11-26 NOTE — Addendum Note (Signed)
Addended by: Helane Gunther on: 11/26/2022 09:27 AM   Modules accepted: Level of Service

## 2022-11-26 NOTE — Progress Notes (Signed)
This patient presents to the office for diabetic foot exam.  Patient was referred to the office by  his medical doctor.  This patient says there is no pain or discomfort in his feet.  No history of infection or drainage.  This patient presents to the office for foot exam due to having a history of diabetes.  Vascular  Dorsalis pedis and posterior tibial pulses are palpable  B/L.  Capillary return  WNL.  Temperature gradient is  WNL.  Skin turgor  WNL  Sensorium  Senn Weinstein monofilament wire diminished.  Normal tactile sensation.  Nail Exam  Patient has normal nails with no evidence of bacterial or fungal infection.  Orthopedic  Exam  Muscle tone and muscle strength  WNL.  No limitations of motion feet  B/L.  No crepitus or joint effusion noted.  Foot type is unremarkable and digits show no abnormalities.  Bony prominences are unremarkable.  Skin  No open lesions.  Normal skin texture and turgor.  Vitiligo feet  B/L.  Thickened skin at base of hallux  B/L.  Diabetes with neuropathy  Diabetic foot exam was performed.  There is no evidence of vascular pathology.  LOPS diminished/absent   RTC  1 year.  Patient says his skin dermatitis hallux  B/L is present.  He has scheduled appointment with his dermatologist.   Helane Gunther DPM

## 2022-12-16 ENCOUNTER — Ambulatory Visit: Payer: Medicare Other | Attending: Family Medicine | Admitting: Family Medicine

## 2022-12-16 ENCOUNTER — Encounter: Payer: Self-pay | Admitting: Family Medicine

## 2022-12-16 VITALS — BP 109/73 | HR 90 | Ht 70.0 in | Wt 132.6 lb

## 2022-12-16 DIAGNOSIS — M8700 Idiopathic aseptic necrosis of unspecified bone: Secondary | ICD-10-CM

## 2022-12-16 DIAGNOSIS — E109 Type 1 diabetes mellitus without complications: Secondary | ICD-10-CM | POA: Diagnosis present

## 2022-12-16 DIAGNOSIS — M879 Osteonecrosis, unspecified: Secondary | ICD-10-CM | POA: Insufficient documentation

## 2022-12-16 DIAGNOSIS — L409 Psoriasis, unspecified: Secondary | ICD-10-CM | POA: Diagnosis not present

## 2022-12-16 DIAGNOSIS — Z794 Long term (current) use of insulin: Secondary | ICD-10-CM | POA: Insufficient documentation

## 2022-12-16 DIAGNOSIS — K219 Gastro-esophageal reflux disease without esophagitis: Secondary | ICD-10-CM

## 2022-12-16 DIAGNOSIS — L80 Vitiligo: Secondary | ICD-10-CM | POA: Diagnosis not present

## 2022-12-16 DIAGNOSIS — E10649 Type 1 diabetes mellitus with hypoglycemia without coma: Secondary | ICD-10-CM | POA: Diagnosis not present

## 2022-12-16 DIAGNOSIS — Z79899 Other long term (current) drug therapy: Secondary | ICD-10-CM | POA: Diagnosis not present

## 2022-12-16 DIAGNOSIS — Z833 Family history of diabetes mellitus: Secondary | ICD-10-CM | POA: Diagnosis not present

## 2022-12-16 DIAGNOSIS — R140 Abdominal distension (gaseous): Secondary | ICD-10-CM | POA: Diagnosis not present

## 2022-12-16 LAB — POCT GLYCOSYLATED HEMOGLOBIN (HGB A1C): HbA1c, POC (controlled diabetic range): 11.3 % — AB (ref 0.0–7.0)

## 2022-12-16 NOTE — Progress Notes (Signed)
Subjective:  Patient ID: Jared Murray, male    DOB: [birth year]  Age: 47 y.o. MRN: [id number]  CC: Diabetes   HPI Jared Murray is a 47 y.o. year old male with a history of  type 1 diabetes mellitus (A1c 11.3), avascular necrosis of both hips, GERD, and Vitiligo, psoriasis who presents today for a follow up visit.    Interval History: He is up-to-date on annual eye exams from 4/24 and did have a foot exam as well this month. He has had a few hypoglycemic episodes with sugars dropping to 68 and he is administering 14 rather than 18 units of Basaglar on his med list.  Also endorses adhering with his Humalog sliding scale.  I had referred him to endocrinology but he always has an excuse as to why he has not been able to make that appointment.  Today he is informing me he had a missed call from them and trying to call them back it was unsuccessful.  He endorses presence of gas in his abdomen despite his taking Protonix as needed. He is unsure if symptoms  are related to certain foods. He has no nausea, vomiting, diarrhea, constipation but endorses burping a lot. He has been unable to undergo hip surgery for his avascular necrosis due to the fact that his diabetes has not been under control. Past Medical History:  Diagnosis Date   Acute pericarditis 03/31/2016   AKI (acute kidney injury) (HCC) 01/22/2015   Allergy    Anxiety    per pt   Arthritis    Avascular necrosis (HCC) 02/12/2015   Avascular necrosis of bone of hip (HCC)    Chest wall abscess 04/03/2015   Chronic diarrhea of unknown origin 09/10/2017   Cutaneous abscess of chest wall    Depression    per pt   DKA (diabetic ketoacidoses)    DKA, type 1, not at goal [facility]) 04/03/2015   Eczema    Elevated liver enzymes 11/27/2018   GERD (gastroesophageal reflux disease)    HLD (hyperlipidemia) 11/25/2018   no meds   Hyperbilirubinemia    Hyperglycemia due to type 1 diabetes mellitus (HCC)    Hyperkalemia    Hypokalemia 03/17/2013    Left hip pain 04/18/2014   Loss of weight 03/17/2013   Marijuana use 11/25/2018   Osteoarthritis 12/24/2016   Prolonged QT interval 11/27/2018   in setting of RBBB and hypokalemia   Protein-calorie malnutrition, severe (HCC) 03/17/2013   Psoriasis    RBBB 12/23/2018   Chronic since at least 2014   Sepsis (HCC) 01/22/2015   Substance abuse (HCC)    Tobacco use disorder 09/13/2015   Type 1 diabetes mellitus (HCC)     Past Surgical History:  Procedure Laterality Date   COLONOSCOPY  2020   COLONOSCOPY WITH PROPOFOL N/A 05/07/2020   Procedure: COLONOSCOPY WITH PROPOFOL;  Surgeon: Lemar Lofty., MD;  Location: [facility] ENDOSCOPY;  Service: Gastroenterology;  Laterality: N/A;   ENDOSCOPIC MUCOSAL RESECTION N/A 05/07/2020   Procedure: ENDOSCOPIC MUCOSAL RESECTION;  Surgeon: Meridee Score Netty Starring., MD;  Location: [facility] ENDOSCOPY;  Service: Gastroenterology;  Laterality: N/A;   FINGER SURGERY Right    middle finger   INCISION AND DRAINAGE ABSCESS Left 01/07/2022   Procedure: IRRIGATION AND DEBRIDEMENT OF LEFT HAND;  Surgeon: Teryl Lucy, MD;  Location: [facility] ORS;  Service: Orthopedics;  Laterality: Left;   SUBMUCOSAL LIFTING INJECTION  05/07/2020   Procedure: SUBMUCOSAL LIFTING INJECTION;  Surgeon: Meridee Score Netty Starring., MD;  Location: [facility] ENDOSCOPY;  Service: Gastroenterology;;    Family History  Problem Relation Age of Onset   Hypertension Father    Diabetes Other        multiple   Lupus Cousin    Stroke Maternal Grandmother    Stroke Paternal Grandmother    Colon cancer Neg Hx    Stomach cancer Neg Hx    Rectal cancer Neg Hx    Esophageal cancer Neg Hx    Colon polyps Neg Hx     Social History   Socioeconomic History   Marital status: Married    Spouse name: Not on file   Number of children: Not on file   Years of education: Not on file   Highest education level: Not on file  Occupational History   Not on file  Tobacco Use   Smoking status: Former    Packs/day: 0.25    Years:  10.00    Additional pack years: 0.00    Total pack years: 2.50    Types: Cigarettes    Quit date: 11/26/2018    Years since quitting: 4.0   Smokeless tobacco: Never  Vaping Use   Vaping Use: Never used  Substance and Sexual Activity   Alcohol use: Not Currently    Alcohol/week: 1.0 - 2.0 standard drink of alcohol    Types: 1 - 2 Shots of liquor per week    Comment: none since 2020   Drug use: Yes    Types: Marijuana   Sexual activity: Yes  Other Topics Concern   Not on file  Social History Narrative   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, physiological   Married   Social Determinants of Corporate investment banker Strain: Low Risk  (04/04/2022)   Overall Financial Resource Strain (CARDIA)    Difficulty of Paying Living Expenses: Not very hard  Food Insecurity: No Food Insecurity (04/04/2022)   Hunger Vital Sign    Worried About Running Out of Food in the Last Year: Never true    Ran Out of Food in the Last Year: Never true  Transportation Needs: No Transportation Needs (11/06/2022)   PRAPARE - Administrator, Civil Service (Medical): No    Lack of Transportation (Non-Medical): No  Physical Activity: Insufficiently Active (04/04/2022)   Exercise Vital Sign    Days of Exercise per Week: 7 days    Minutes of Exercise per Session: 20 min  Stress: No Stress Concern Present (04/04/2022)   [employer] of Occupational Health - Occupational Stress Questionnaire    Feeling of Stress : Not at all  Social Connections: Moderately Integrated (04/04/2022)   Social Connection and Isolation Panel [NHANES]    Frequency of Communication with Friends and Family: Twice a week    Frequency of Social Gatherings with Friends and Family: Once a week    Attends Religious Services: 1 to 4 times per year    Active Member of [employer] or Organizations: No    Attends Engineer, structural: Never    Marital Status: Married    Allergies  Allergen Reactions   Bee Venom Swelling   Latex  Itching and Rash   Penicillins Other (See Comments)    Childhood allergy Has patient had a PCN reaction causing immediate rash, facial/tongue/throat swelling, SOB or lightheadedness with hypotension: NO Has patient had a PCN reaction causing severe rash involving mucus membranes or skin necrosis:NO Has patient had a PCN reaction that required hospitalization NO Has patient had a PCN reaction  occurring within the last 10 years: NO If all of the above answers are "NO", then may proceed with Cephalosporin use.    Outpatient Medications Prior to Visit  Medication Sig Dispense Refill   Accu-Chek Softclix Lancets lancets Use to check blood sugar three times daily. E10.65 100 each 0   aspirin-sod bicarb-citric acid (ALKA-SELTZER) 325 MG TBEF tablet Take 325 mg by mouth every 6 (six) hours as needed (heartburn).     Blood Glucose Monitoring Suppl (ACCU-CHEK GUIDE) w/Device KIT Use to check blood sugar 3 times daily. E10.65 1 kit 0   clobetasol cream (TEMOVATE) 0.05 % Apply 1 Application topically 2 (two) times daily. 30 g 3   glucose blood (ACCU-CHEK GUIDE) test strip Use to check blood sugar three times daily. E10.65 100 each 0   Insulin Glargine (BASAGLAR KWIKPEN) 100 UNIT/ML Inject 18 Units into the skin at bedtime. 15 mL 3   insulin lispro (HUMALOG KWIKPEN) 100 UNIT/ML KwikPen Inject 0-12 Units into the skin 3 (three) times daily. Per sliding scale.For blood sugars 0-150 give 0 units of insulin, 151-200 give 2 units of insulin, 201-250 give 4 units, 251-300 give 6 units, 301-350 give 8 units, 351-400 give 10 units,> 400 give 12 units and call M.D. 15 mL 6   INSULIN SYRINGE 1CC/29G 29G X 1/2" 1 ML MISC use as directed (Patient taking differently: 1 each by Other route See admin instructions. use as directed) 100 each 2   pantoprazole (PROTONIX) 40 MG tablet TAKE 1 TABLET (40 MG TOTAL) BY MOUTH DAILY. 90 tablet 1   Tetrahydrozoline HCl (VISINE OP) Place 1 drop into both eyes daily as needed (dryness /  allergies).     amoxicillin-clavulanate (AUGMENTIN) 875-125 MG tablet Take 1 tablet by mouth 2 (two) times daily. (Patient not taking: Reported on 04/04/2022) 20 tablet 0   atorvastatin (LIPITOR) 20 MG tablet TAKE 1 TABLET (20 MG TOTAL) BY MOUTH DAILY. (Patient taking differently: Take 20 mg by mouth daily.) 30 tablet 5   oxyCODONE (ROXICODONE) 5 MG immediate release tablet Take 1 tablet (5 mg total) by mouth every 8 (eight) hours as needed. (Patient not taking: Reported on 04/04/2022) 20 tablet 0   sildenafil (VIAGRA) 50 MG tablet Take 1 tablet (50 mg total) by mouth daily as needed for erectile dysfunction (At least 24 hours between doses). (Patient not taking: Reported on 01/04/2022) 10 tablet 1   No facility-administered medications prior to visit.     ROS Review of Systems  Constitutional:  Negative for activity change and appetite change.  HENT:  Negative for sinus pressure and sore throat.   Respiratory:  Negative for chest tightness, shortness of breath and wheezing.   Cardiovascular:  Negative for chest pain and palpitations.  Gastrointestinal:  Negative for abdominal distention, abdominal pain and constipation.  Genitourinary: Negative.   Musculoskeletal:        Bilateral hip pain  Psychiatric/Behavioral:  Negative for behavioral problems and dysphoric mood.     Objective:  BP 109/73   Pulse 90   Ht 5\' 10"  (1.778 m)   Wt 132 lb 9.6 oz (60.1 kg)   SpO2 100%   BMI 19.03 kg/m      12/16/2022    3:30 PM 09/17/2022    3:57 PM 01/09/2022    2:00 PM  BP/Weight  Systolic BP 109 106 101  Diastolic BP 73 69 75  Wt. (Lbs) 132.6 130.4   BMI 19.03 kg/m2 18.71 kg/m2       Physical Exam Constitutional:  Appearance: He is well-developed.  Cardiovascular:     Rate and Rhythm: Normal rate.     Heart sounds: Normal heart sounds. No murmur heard. Pulmonary:     Effort: Pulmonary effort is normal.     Breath sounds: Normal breath sounds. No wheezing or rales.  Chest:      Chest wall: No tenderness.  Abdominal:     General: Bowel sounds are normal. There is no distension.     Palpations: Abdomen is soft. There is no mass.     Tenderness: There is no abdominal tenderness.  Musculoskeletal:        General: Normal range of motion.     Right lower leg: No edema.     Left lower leg: No edema.  Skin:    Comments: Psoriatic patches on skin  Neurological:     Mental Status: He is alert and oriented to person, place, and time.  Psychiatric:        Mood and Affect: Mood normal.        Latest Ref Rng & Units 09/17/2022    4:52 PM 01/08/2022    3:12 AM 01/07/2022    3:01 AM  CMP  Glucose 70 - 99 mg/dL 161  096  045   BUN 6 - 24 mg/dL 12  17  8    Creatinine 0.76 - 1.27 mg/dL 4.09  8.11  9.14   Sodium 134 - 144 mmol/L 138  128  131   Potassium 3.5 - 5.2 mmol/L 4.5  4.9  4.9   Chloride 96 - 106 mmol/L 102  95  99   CO2 20 - 29 mmol/L 21  23  24    Calcium 8.7 - 10.2 mg/dL 9.3  8.3  8.3   Total Protein 6.0 - 8.5 g/dL 7.2  5.7  5.6   Total Bilirubin 0.0 - 1.2 mg/dL 0.7  0.8  1.1   Alkaline Phos 44 - 121 IU/L 166  91  88   AST 0 - 40 IU/L 29  24  24    ALT 0 - 44 IU/L 37  20  18     Lipid Panel     Component Value Date/Time   CHOL 107 12/19/2021 1215   TRIG 37 12/19/2021 1215   HDL 38 (L) 12/19/2021 1215   CHOLHDL 2.9 12/18/2020 1104   CHOLHDL 2.3 12/19/2015 0947   VLDL 12 12/19/2015 0947   LDLCALC 59 12/19/2021 1215    CBC    Component Value Date/Time   WBC 5.1 01/08/2022 0312   RBC 3.55 (L) 01/08/2022 0312   HGB 9.8 (L) 01/08/2022 0312   HGB 12.4 (L) 12/16/2018 1444   HCT 29.9 (L) 01/08/2022 0312   HCT 36.8 (L) 12/16/2018 1444   PLT 387 01/08/2022 0312   PLT 299 12/16/2018 1444   MCV 84.2 01/08/2022 0312   MCV 89 12/16/2018 1444   MCH 27.6 01/08/2022 0312   MCHC 32.8 01/08/2022 0312   RDW 13.1 01/08/2022 0312   RDW 13.0 12/16/2018 1444   LYMPHSABS 2,095 12/30/2019 1651   LYMPHSABS 2.1 12/16/2018 1444   MONOABS 0.4 11/25/2018 0352    EOSABS 170 12/30/2019 1651   EOSABS 0.2 12/16/2018 1444   BASOSABS 50 12/30/2019 1651   BASOSABS 0.0 12/16/2018 1444    Lab Results  Component Value Date   HGBA1C 11.3 (A) 12/16/2022    Assessment & Plan:  1. Type 1 diabetes mellitus with hypoglycemia and without coma (HCC) Uncontrolled with A1c of 11.3, goal is  less than 7.0 He has had labile sugars Previously followed by [facility] endocrinology then was lost to follow-up Compliance with specialty follow-up has been a challenge and I have provided him with the number to the endocrinologist so he can schedule an appointment he self adjusted his insulin due to concern for hypoglycemia He will be a candidate for the insulin pump and CGM Counseled on Diabetic diet, my plate method, 403 minutes of moderate intensity exercise/week Blood sugar logs with fasting goals of 80-120 mg/dl, random of less than 474 and in the event of sugars less than 60 mg/dl or greater than 259 mg/dl encouraged to notify the clinic. Advised on the need for annual eye exams, annual foot exams, Pneumonia vaccine. - Microalbumin/Creatinine Ratio, Urine - POCT glycosylated hemoglobin (Hb A1C)  2. Abdominal bloating Continue PPI - H. pylori breath test  3. Psoriasis Stable on topical steroids   4. Gastroesophageal reflux disease without esophagitis He uses his PPI as needed  5. Avascular necrosis (HCC) Unfortunately cannot undergo surgery until his A1c is controlled    No orders of the defined types were placed in this encounter.   Follow-up: Return in about 3 months (around 03/17/2023).       Hoy Register, MD, FAAFP. [facility] [place], [place] [phone]   12/16/2022, 5:11 PM

## 2022-12-16 NOTE — Patient Instructions (Signed)
Abdominal Bloating When you have abdominal bloating, your abdomen may feel full, tight, or painful. It may also look bigger than normal or swollen (distended). Common causes of abdominal bloating include: Swallowing air. Constipation. Problems digesting food. Eating too much. Irritable bowel syndrome. This is a condition that affects the large intestine. Lactose intolerance. This is an inability to digest lactose, a natural sugar in dairy products. Celiac disease. This is a condition that affects the ability to digest gluten, a protein found in some grains. Gastroparesis. This is a condition that slows down the movement of food in the stomach and small intestine. It is more common in people with diabetes mellitus. Gastroesophageal reflux disease (GERD). This is a condition that makes stomach acid flow back into the esophagus. Urinary retention. This means that the body is holding onto urine, and the bladder cannot be emptied all the way. Follow these instructions at home: Eating and drinking Avoid eating too much. Try not to swallow air while talking or eating. Avoid eating while lying down. Avoid these foods and drinks: Foods that cause gas, such as broccoli, cabbage, cauliflower, and baked beans. Carbonated drinks. Hard candy. Chewing gum. Medicines Take over-the-counter and prescription medicines only as told by your health care provider. Take probiotic medicines. These medicines contain live bacteria or yeasts that can help digestion. Take coated peppermint oil capsules. General instructions Try to exercise regularly. Exercise may help to relieve bloating that is caused by gas and relieve constipation. Keep all follow-up visits. This is important. Contact a health care provider if: You have nausea and vomiting. You have diarrhea. You have abdominal pain. You have unusual weight loss or weight gain. You have severe pain, and medicines do not help. Get help right away if: You  have chest pain. You have trouble breathing. You have shortness of breath. You have trouble urinating. You have darker urine than normal. You have blood in your stools or have dark, tarry stools. These symptoms may represent a serious problem that is an emergency. Do not wait to see if the symptoms will go away. Get medical help right away. Call your local emergency services (911 in the [place] Do not drive yourself to the hospital. Summary Abdominal bloating means that the abdomen is swollen. Common causes of abdominal bloating are swallowing air, constipation, and problems digesting food. Avoid eating too much and avoid swallowing air. Avoid foods that cause gas, carbonated drinks, hard candy, and chewing gum. This information is not intended to replace advice given to you by your health care provider. Make sure you discuss any questions you have with your health care provider. Document Revised: 03/06/2020 Document Reviewed: 03/06/2020 Elsevier Patient Education  2023 Elsevier Inc.  

## 2022-12-18 LAB — H. PYLORI BREATH TEST: H pylori Breath Test: NEGATIVE

## 2022-12-23 ENCOUNTER — Ambulatory Visit: Payer: Self-pay

## 2022-12-24 ENCOUNTER — Telehealth: Payer: Self-pay | Admitting: Pharmacist

## 2022-12-24 ENCOUNTER — Telehealth: Payer: Self-pay | Admitting: Family Medicine

## 2022-12-24 ENCOUNTER — Other Ambulatory Visit: Payer: Self-pay | Admitting: Pharmacist

## 2022-12-24 ENCOUNTER — Other Ambulatory Visit: Payer: Self-pay

## 2022-12-24 DIAGNOSIS — E1065 Type 1 diabetes mellitus with hyperglycemia: Secondary | ICD-10-CM

## 2022-12-24 MED ORDER — DEXCOM G7 SENSOR MISC
6 refills | Status: DC
Start: 2022-12-24 — End: 2022-12-24

## 2022-12-24 MED ORDER — DEXCOM G7 RECEIVER DEVI
0 refills | Status: DC
Start: 2022-12-24 — End: 2022-12-24
  Filled 2022-12-24: qty 1, 30d supply, fill #0

## 2022-12-24 MED ORDER — DEXCOM G7 RECEIVER DEVI
0 refills | Status: AC
Start: 2022-12-24 — End: ?

## 2022-12-24 MED ORDER — DEXCOM G7 SENSOR MISC
6 refills | Status: DC
Start: 2022-12-24 — End: 2023-08-07

## 2022-12-24 MED ORDER — DEXCOM G7 SENSOR MISC
6 refills | Status: DC
Start: 2022-12-24 — End: 2022-12-24
  Filled 2022-12-24: qty 3, 30d supply, fill #0

## 2022-12-24 MED ORDER — DEXCOM G7 RECEIVER DEVI
0 refills | Status: DC
Start: 2022-12-24 — End: 2022-12-24

## 2022-12-24 NOTE — Telephone Encounter (Signed)
Thank you :)

## 2022-12-24 NOTE — Patient Instructions (Signed)
Visit Information  Thank you for taking time to visit with me today. Please don't hesitate to contact me if I can be of assistance to you.   Following are the goals we discussed today:   Goals Addressed             This Visit's Progress    To lower A1c <7.5%       Care Coordination Interventions: Evaluation of current treatment plan related to type 1 diabetes mellitus and patient's adherence to plan as established by provider Confirmed patient completed his Podiatry follow up with Dr. Stacie Acres, he completed his ophthalmology follow up with Dr. Nile Riggs, he completed his PCP follow up with Dr. Alvis Lemmings Discussed with patient he missed the call from Endocrinology, his wife attempted to call to schedule his initial consult but was unsuccessful Placed joint call with patient and [facility] Endocrinology, patient was able to get scheduled for 04/20/23 @2 :00 PM, he has been added to the cancellation list and instructions were provide to him regarding this, he was provided the address and direct number for this provider Assessed for SDOH barriers, patient was concerned about the distance from [facility] to [facility] for which this provider is listed Educated patient regarding [facility] Care Guide assistance for transportation resources, patient declines at this time but may consider if his wife is unavailable  Assessed for adherence to blood sugar monitoring, patient reports checking CBG's twice daily, educated patient about continuous blood sugar monitoring and discussed the benefits, determined patient had tried a Dexom sensor several years back but experienced a rash from the adhesive Discussed with patient he would like to revisit using continuous monitoring again if possible  Sent teams message to Georgiana Shore Ausdall Pharm D with [facility] and Wellness requesting assistance with helping patient obtain a Dexcom G7 or Libre sensor for continuous monitoring  Routed note to PCP, Dr.  Alvis Lemmings advising her of patient status update  Assessed patient's understanding of A1c goal:  7.5% Lab Results  Component Value Date   HGBA1C 11.3 (A) 12/16/2022          To schedule appointment with Dermatology for evaluation of psoriasis       Care Coordination Interventions: Evaluation of current treatment plan related to Psoriasis and patient's adherence to plan as established by provider Reviewed with patient PCP referral placed for Dermatology to evaluate and treat Psoriasis Determined referral has been authorized and ready to schedule Provided patient with the contact number and location and encouraged him to call to schedule this appointment when ready, and he is agreeable            Our next appointment is by telephone on 01/20/23 at 10:30 AM  Please call the care guide team at [phone] if you need to cancel or reschedule your appointment.   If you are experiencing a Mental Health or Behavioral Health Crisis or need someone to talk to, please call 1-800-273-TALK (toll free, 24 hour hotline) go to [facility] [address] [place] [phone])  Patient verbalizes understanding of instructions and care plan provided today and agrees to view in MyChart. Active MyChart status and patient understanding of how to access instructions and care plan via MyChart confirmed with patient.     Delsa Sale, RN, BSN, CCM Care Management Coordinator [facility] Care Management  Direct Phone: [phone]

## 2022-12-24 NOTE — Telephone Encounter (Signed)
Correction: it looks like he has to fill these through a DME supplier or pharmacy that accepts part B. I will resent [facility] preferred [facility].

## 2022-12-24 NOTE — Telephone Encounter (Signed)
Copied from CRM [id number]. Topic: General - Other >> Dec 24, 2022 11:48 AM Clide Dales wrote: Patient called to provide [facility] with the email address to use for his Dexcom. [email or web address]

## 2022-12-24 NOTE — Telephone Encounter (Signed)
Received notification from [facility] that the rxns for Dexcom were not able to be approved. Pt has Medicare B and may require CGM supplies to be dispensed by a DME provider. Corliss Blacker is a DME provider that is contracted with Medicare to provide Dexcom supplies to patients.   With permission from the patient, I used his demographic information to start an account with [facility]. Using the email and preferred contact number listed in his demographics, I started the process of creating an account. At this time, the initial process is complete. [facility] will contact him 1-2 business days from now to continue setting up the account. I called Mr. Chrzanowski and informed him of this. He will be on the lookout for a call/email. I have already sent G7 rxns to [facility]. Once his account is finalized, [facility] will contact our office for next steps.   Butch Penny, PharmD, Patsy Baltimore, CPP Clinical Pharmacist [facility] [phone]

## 2022-12-24 NOTE — Patient Outreach (Signed)
  Care Coordination   Follow Up Visit Note   12/24/2022 Name: Jared Murray MRN: [id number] DOB: 1976-03-18  Jared Murray is a 47 y.o. year old male who sees Jared Register, Jared Murray for primary care. I spoke with  Jared Murray by phone today.  What matters to the patients health and wellness today?  Patient would like to retry using a Dexcom sensor for continuous blood glucose monitoring.     Goals Addressed             This Visit's Progress    To lower A1c <7.5%       Care Coordination Interventions: Evaluation of current treatment plan related to type 1 diabetes mellitus and patient's adherence to plan as established by provider Confirmed patient completed his Podiatry follow up with Jared Murray, he completed his ophthalmology follow up with Jared Murray, he completed his PCP follow up with Jared Murray Discussed with patient he missed the call from Endocrinology, his wife attempted to call to schedule his initial consult but was unsuccessful Placed joint call with patient and [facility] Endocrinology, patient was able to get scheduled for 04/20/23 @2 :00 PM, he has been added to the cancellation list and instructions were provide to him regarding this, he was provided the address and direct number for this provider Assessed for SDOH barriers, patient was concerned about the distance from [facility] to [facility] for which this provider is listed Educated patient regarding [facility] Care Guide assistance for transportation resources, patient declines at this time but may consider if his wife is unavailable  Assessed for adherence to blood sugar monitoring, patient reports checking CBG's twice daily, educated patient about continuous blood sugar monitoring and discussed the benefits, determined patient had tried a Dexom sensor several years back but experienced a rash from the adhesive Discussed with patient he would like to revisit using continuous monitoring again if  possible  Sent teams message to Jared Murray with [facility] and Wellness requesting assistance with helping patient obtain a Dexcom G7 or Libre sensor for continuous monitoring  Routed note to PCP, Jared Murray advising her of patient status update  Assessed patient's understanding of A1c goal:  7.5% Lab Results  Component Value Date   HGBA1C 11.3 (A) 12/16/2022          Interventions Today    Flowsheet Row Most Recent Value  Chronic Disease   Chronic disease during today's visit Diabetes  General Interventions   General Interventions Discussed/Reviewed General Interventions Discussed, General Interventions Reviewed, Annual Eye Exam, Annual Foot Exam, Communication with, Doctor Visits  Doctor Visits Discussed/Reviewed Doctor Visits Discussed, Doctor Visits Reviewed, PCP, Specialist  PCP/Specialist Visits Compliance with follow-up visit  Communication with PCP/Specialists  [[facility] Endocrinology]  Education Interventions   Education Provided Provided Education  Provided Verbal Education On When to see the doctor          SDOH assessments and interventions completed:  No     Care Coordination Interventions:  Yes, provided   Follow up plan: Follow up call scheduled for 01/20/23 @10 :30 AM    Encounter Outcome:  Pt. Visit Completed

## 2022-12-24 NOTE — Addendum Note (Signed)
Addended by: Lois Huxley, Jeannett Senior L on: 12/24/2022 09:44 AM   Modules accepted: Orders

## 2022-12-24 NOTE — Telephone Encounter (Signed)
Can we try to get approval for Dexcom G7? The adhesive on the larger G6 sensor irritates his skin and he is unable to use it. Rxns for G7 sensor and receiver sent.

## 2023-01-09 ENCOUNTER — Other Ambulatory Visit: Payer: Self-pay

## 2023-01-09 ENCOUNTER — Other Ambulatory Visit: Payer: Self-pay | Admitting: Pharmacist

## 2023-01-09 MED ORDER — FIASP FLEXTOUCH 100 UNIT/ML ~~LOC~~ SOPN
PEN_INJECTOR | SUBCUTANEOUS | 2 refills | Status: DC
  Filled 2023-01-09: qty 15, 46d supply, fill #0

## 2023-01-20 ENCOUNTER — Ambulatory Visit: Payer: Self-pay

## 2023-01-20 NOTE — Patient Instructions (Signed)
Visit Information  Thank you for taking time to visit with me today. Please don't hesitate to contact me if I can be of assistance to you.   Following are the goals we discussed today:   Goals Addressed             This Visit's Progress    To lower A1c <7.5%       Care Coordination Interventions: Evaluation of current treatment plan related to type 1 diabetes mellitus and patient's adherence to plan as established by provider Discussed with patient he received his Dexcom sensor and is now continuously monitoring his blood glucose levels Determined patient is currently experiencing technical difficulty after applying his sensor x 3 days Provided patient the contact number for tech support for Dexcom in order to trouble shoot the issue Re-educated about the daily glycemic control to aim for, FBS 80-130, <180 after meals and patient verbalizes understanding  Lab Results  Component Value Date   HGBA1C 11.3 (A) 12/16/2022        To schedule appointment with Dermatology for evaluation of psoriasis       Care Coordination Interventions: Evaluation of current treatment plan related to Psoriasis and patient's adherence to plan as established by provider Determined patient has yet to hear from the Dermatologist regarding a new patient appointment  Reviewed with patient, PCP referral placed for Dermatology to evaluate and treat Psoriasis Determined referral has been authorized and ready to schedule Provided patient with the contact number and location and encouraged him to call to schedule this appointment when ready, and he is agreeable         Our next appointment is by telephone on 03/23/23 at 10:30 AM   Please call the care guide team at [phone] if you need to cancel or reschedule your appointment.   If you are experiencing a Mental Health or Behavioral Health Crisis or need someone to talk to, please call 1-800-273-TALK (toll free, 24 hour hotline)  Patient verbalizes  understanding of instructions and care plan provided today and agrees to view in MyChart. Active MyChart status and patient understanding of how to access instructions and care plan via MyChart confirmed with patient.     Delsa Sale, RN, BSN, CCM Care Management Coordinator [facility] Care Management Direct Phone: [phone]

## 2023-01-20 NOTE — Patient Outreach (Signed)
  Care Coordination   Follow Up Visit Note   01/20/2023 Name: Jared Murray MRN: [id number] DOB: Apr 21, 1976  Jared Murray is a 47 y.o. year old male who sees Hoy Register, MD for primary care. I spoke with  Jared Murray by phone today.  What matters to the patients health and wellness today?  Patient will continue to monitor his blood sugars as directed. He will contact dermatology to schedule his initial visit.    Goals Addressed             This Visit's Progress    To lower A1c <7.5%       Care Coordination Interventions: Evaluation of current treatment plan related to type 1 diabetes mellitus and patient's adherence to plan as established by provider Discussed with patient he received his Dexcom sensor and is now continuously monitoring his blood glucose levels Determined patient is currently experiencing technical difficulty after applying his sensor x 3 days Provided patient the contact number for tech support for Dexcom in order to trouble shoot the issue Re-educated about the daily glycemic control to aim for, FBS 80-130, <180 after meals and patient verbalizes understanding  Lab Results  Component Value Date   HGBA1C 11.3 (A) 12/16/2022        To schedule appointment with Dermatology for evaluation of psoriasis       Care Coordination Interventions: Evaluation of current treatment plan related to Psoriasis and patient's adherence to plan as established by provider Determined patient has yet to hear from the Dermatologist regarding a new patient appointment  Reviewed with patient, PCP referral placed for Dermatology to evaluate and treat Psoriasis Determined referral has been authorized and ready to schedule Provided patient with the contact number and location and encouraged him to call to schedule this appointment when ready, and he is agreeable     Interventions Today    Flowsheet Row Most Recent Value  Chronic Disease   Chronic disease during  today's visit Diabetes  General Interventions   General Interventions Discussed/Reviewed General Interventions Discussed, General Interventions Reviewed, Doctor Visits, Labs, Durable Medical Equipment (DME)  Durable Medical Equipment (DME) Glucomoter  Education Interventions   Education Provided Provided Education  Provided Verbal Education On Blood Sugar Monitoring          SDOH assessments and interventions completed:  No     Care Coordination Interventions:  Yes, provided   Follow up plan: Follow up call scheduled for 03/23/23 @10 :30 AM     Encounter Outcome:  Pt. Visit Completed

## 2023-03-23 ENCOUNTER — Ambulatory Visit: Payer: Self-pay

## 2023-03-23 ENCOUNTER — Telehealth: Payer: Self-pay | Admitting: Pharmacist

## 2023-03-23 NOTE — Telephone Encounter (Signed)
Contacted patient to set up appt for DM management. Scheduled OV with me 8/9 @11am .

## 2023-03-23 NOTE — Patient Outreach (Signed)
  Care Coordination   Follow Up Visit Note   03/23/2023 Name: Jared Murray MRN: [id number] DOB: 08-03-1976  Jared Murray is a 47 y.o. year old male who sees Hoy Register, MD for primary care. I spoke with  Jared Murray by phone today.  What matters to the patients health and wellness today?  Patient would like to continue to better manage his diabetes. He would like to lower his A1c.     Goals Addressed             This Visit's Progress    To lower A1c <7.5%       Care Coordination Interventions: Evaluation of current treatment plan related to type 1 diabetes mellitus and patient's adherence to plan as established by provider Review of patient status, including review of consultants reports, relevant laboratory and other test results, and medications completed  Counseled on importance of regular laboratory monitoring as prescribed Provided patient with written educational materials related to hypo and hyperglycemia and importance of correct treatment Reviewed scheduled/upcoming provider appointments Referral made to pharmacy team for assistance with Emelda Fear D  Mailed printed educational materials related to Diabetes management  Lab Results  Component Value Date   HGBA1C 11.3 (A) 12/16/2022  To schedule an appointment with Dermatology for treatment of Psoriasis         Care Coordination Interventions: Evaluation of current treatment plan related to Psoriasis and patient's adherence to plan as established by provider Reviewed upcoming scheduled new patient follow up with Dermatology scheduled for 05/18/23 @11 :30 AM    Interventions Today    Flowsheet Row Most Recent Value  Chronic Disease   Chronic disease during today's visit Diabetes, Other  [Psorasis]  General Interventions   General Interventions Discussed/Reviewed General Interventions Discussed, General Interventions Reviewed, Doctor Visits  Doctor Visits Discussed/Reviewed Doctor Visits  Discussed, Doctor Visits Reviewed, Specialist  Education Interventions   Education Provided Provided Education, Provided Printed Education  Provided Verbal Education On When to see the doctor, Blood Sugar Monitoring          SDOH assessments and interventions completed:  No     Care Coordination Interventions:  Yes, provided   Follow up plan: Follow up call scheduled for 05/25/23 @10 :30 AM    Encounter Outcome:  Pt. Visit Completed

## 2023-03-23 NOTE — Patient Instructions (Addendum)
Visit Information  Thank you for taking time to visit with me today. Please don't hesitate to contact me if I can be of assistance to you.   Following are the goals we discussed today:   Goals Addressed             This Visit's Progress    To lower A1c <7.5%       Care Coordination Interventions: Evaluation of current treatment plan related to type 1 diabetes mellitus and patient's adherence to plan as established by provider Review of patient status, including review of consultants reports, relevant laboratory and other test results, and medications completed  Counseled on importance of regular laboratory monitoring as prescribed Provided patient with written educational materials related to hypo and hyperglycemia and importance of correct treatment Reviewed scheduled/upcoming provider appointments Referral made to pharmacy team for assistance with Emelda Fear D  Mailed printed educational materials related to Diabetes management  Lab Results  Component Value Date   HGBA1C 11.3 (A) 12/16/2022       To schedule appointment with Dermatology for evaluation of psoriasis       Care Coordination Interventions: Evaluation of current treatment plan related to Psoriasis and patient's adherence to plan as established by provider Reviewed upcoming scheduled new patient follow up with Dermatology scheduled for 05/18/23 @11 :30 AM         Our next appointment is by telephone on 05/25/23 @10 :30 AM   Please call the care guide team at [phone] if you need to cancel or reschedule your appointment.   If you are experiencing a Mental Health or Behavioral Health Crisis or need someone to talk to, please call 1-800-273-TALK (toll free, 24 hour hotline)  Patient verbalizes understanding of instructions and care plan provided today and agrees to view in MyChart. Active MyChart status and patient understanding of how to access instructions and care plan via MyChart confirmed with  patient.     Delsa Sale, RN, BSN, CCM Care Management Coordinator [facility] Care Management  Direct Phone: [phone]

## 2023-03-27 ENCOUNTER — Other Ambulatory Visit: Payer: Self-pay

## 2023-03-27 ENCOUNTER — Ambulatory Visit: Payer: Medicare Other | Attending: Family Medicine | Admitting: Pharmacist

## 2023-03-27 ENCOUNTER — Encounter: Payer: Self-pay | Admitting: Pharmacist

## 2023-03-27 DIAGNOSIS — E1065 Type 1 diabetes mellitus with hyperglycemia: Secondary | ICD-10-CM | POA: Diagnosis not present

## 2023-03-27 DIAGNOSIS — Z794 Long term (current) use of insulin: Secondary | ICD-10-CM | POA: Diagnosis not present

## 2023-03-27 DIAGNOSIS — E109 Type 1 diabetes mellitus without complications: Secondary | ICD-10-CM | POA: Diagnosis present

## 2023-03-27 DIAGNOSIS — E10649 Type 1 diabetes mellitus with hypoglycemia without coma: Secondary | ICD-10-CM | POA: Insufficient documentation

## 2023-03-27 MED ORDER — FIASP FLEXTOUCH 100 UNIT/ML ~~LOC~~ SOPN
4.0000 [IU] | PEN_INJECTOR | Freq: Two times a day (BID) | SUBCUTANEOUS | 2 refills | Status: DC
  Filled 2023-03-27: qty 6, 75d supply, fill #0

## 2023-03-27 MED ORDER — FAMOTIDINE 20 MG PO TABS
20.0000 mg | ORAL_TABLET | Freq: Two times a day (BID) | ORAL | 1 refills | Status: DC
  Filled 2023-03-27: qty 180, 90d supply, fill #0

## 2023-03-27 MED ORDER — ESOMEPRAZOLE MAGNESIUM 40 MG PO CPDR
40.0000 mg | DELAYED_RELEASE_CAPSULE | Freq: Two times a day (BID) | ORAL | 1 refills | Status: AC
  Filled 2023-03-27: qty 180, 90d supply, fill #0

## 2023-03-27 MED ORDER — BASAGLAR KWIKPEN 100 UNIT/ML ~~LOC~~ SOPN
10.0000 [IU] | PEN_INJECTOR | Freq: Every day | SUBCUTANEOUS | 3 refills | Status: DC
Start: 2023-03-27 — End: 2023-05-06
  Filled 2023-03-27: qty 9, 90d supply, fill #0

## 2023-03-27 NOTE — Progress Notes (Signed)
    S:     No chief complaint on file.  47 y.o. male who presents for diabetes evaluation, education, and management. Patient arrives in good spirits and presents without any assistance. PMH is significant for T1DM w/ hx of DKA, chronic diarrhea, NV, weight loss/protein-calorie malnutrition.  Patient was last seen by Primary Care Provider, Dr. Alvis Lemmings, on 12/16/2022. Since then, I've helped him get set up with his Dexcom. Summary of data below. He was referred 03/23/2023 by our Care Management team for assistance with blood sugar control.   Family/Social History:  -Fhx: HTN, Lupus, DM, stroke -Tobacco: former smoker (quit in 2020) -Alcohol: none reported   Current diabetes medications include: Lantus 14u + SSI Fiasp Patient reports adherence to taking all medications as prescribed. On avg, takes ~3-4 u of Fiasp and will sometimes take AFTER meals. Notes hypoglycemia with doses >4u with meals.   Insurance coverage: Medicare  Patient reports hypoglycemic events. Gives readings in the 60s if uses Fiasp >4u with meals. Usually occurs overnight.   Patient denies nocturia (nighttime urination).  Patient denies neuropathy (nerve pain). Patient denies visual changes. Patient reports self foot exams.   Patient reported dietary habits: Eats 2 meals/day -Usually lunch and dinner  -Requests dietician referral   Patient-reported exercise habits: none   O:   ROS  Physical Exam   Dexcom G7 CGM Download today (03/27/2023). Average Glucose: 242 mg/dL Glucose Management Indicator: 9.1 % Time in Goal:  - Time in range 70-180: 31% - Time above range: 26% -Time >250: 43% - Time below range: % Observed patterns:   Lab Results  Component Value Date   HGBA1C 11.3 (A) 12/16/2022   There were no vitals filed for this visit.  Lipid Panel     Component Value Date/Time   CHOL 107 12/19/2021 1215   TRIG 37 12/19/2021 1215   HDL 38 (L) 12/19/2021 1215   CHOLHDL 2.9 12/18/2020 1104    CHOLHDL 2.3 12/19/2015 0947   VLDL 12 12/19/2015 0947   LDLCALC 59 12/19/2021 1215    Clinical Atherosclerotic Cardiovascular Disease (ASCVD): No  The ASCVD Risk score (Arnett DK, et al., 2019) failed to calculate for the following reasons:   The valid total cholesterol range is 130 to 320 mg/dL   Patient is participating in a Managed Medicaid Plan: No   A/P: Diabetes longstanding currently uncontrolled. GMI shows improvement vs. A1c collected in April. Patient is able to verbalize appropriate hypoglycemia management plan. Medication adherence appears appropriate but his sugars are labile. Will move basal insulin to qAM dosing and decrease to 10u. Encouraged him to use 4u of Fiasp before meals and stop SSI. He will hold if sugars are <150 before eating. -Decreased dose of Basaglar to 10u daily in the morning.  -Start Fiasp 4u BID before meals. Hold if preprandial glucose is under 150.  -Continue G7 monitoring.  -Extensively discussed pathophysiology of diabetes, recommended lifestyle interventions, dietary effects on blood sugar control.  -Counseled on s/sx of and management of hypoglycemia.  -Next A1c is due. Will get at follow-up.    Written patient instructions provided. Patient verbalized understanding of treatment plan.  Total time in face to face counseling 30 minutes.    Follow-up:  Pharmacist in 1 month.  Butch Penny, PharmD, P[place]y [place], CPP Clinical Pharmacist [facility] [phone]

## 2023-03-30 ENCOUNTER — Encounter: Payer: Self-pay | Admitting: Podiatry

## 2023-03-30 ENCOUNTER — Ambulatory Visit (INDEPENDENT_AMBULATORY_CARE_PROVIDER_SITE_OTHER): Payer: Medicare Other | Admitting: Podiatry

## 2023-03-30 ENCOUNTER — Other Ambulatory Visit: Payer: Self-pay

## 2023-03-30 DIAGNOSIS — E1065 Type 1 diabetes mellitus with hyperglycemia: Secondary | ICD-10-CM | POA: Diagnosis not present

## 2023-03-30 DIAGNOSIS — B351 Tinea unguium: Secondary | ICD-10-CM | POA: Diagnosis not present

## 2023-03-30 DIAGNOSIS — M79675 Pain in left toe(s): Secondary | ICD-10-CM | POA: Diagnosis not present

## 2023-03-30 DIAGNOSIS — M79674 Pain in right toe(s): Secondary | ICD-10-CM | POA: Diagnosis not present

## 2023-03-30 DIAGNOSIS — L309 Dermatitis, unspecified: Secondary | ICD-10-CM

## 2023-03-30 MED ORDER — CLOTRIMAZOLE-BETAMETHASONE 1-0.05 % EX CREA: 1.0000 | TOPICAL_CREAM | Freq: Every day | CUTANEOUS | 0 refills | Status: DC

## 2023-03-30 NOTE — Progress Notes (Signed)
This patient presents to the office for diabetic foot exam.  Patient was referred to the office by  his medical doctor.  This patient says there is no pain or discomfort in his feet.  No history of infection or drainage.  This patient presents to the office for foot exam due to having a history of diabetes.  Vascular  Dorsalis pedis and posterior tibial pulses are palpable  B/L.  Capillary return  WNL.  Temperature gradient is  WNL.  Skin turgor  WNL  Sensorium  Senn Weinstein monofilament wire diminished.  Normal tactile sensation.  Nail Exam  Patient has normal nails with no evidence of bacterial or fungal infection.  Orthopedic  Exam  Muscle tone and muscle strength  WNL.  No limitations of motion feet  B/L.  No crepitus or joint effusion noted.  Foot type is unremarkable and digits show no abnormalities.  Bony prominences are unremarkable.  Skin  No open lesions.  Normal skin texture and turgor.  Vitiligo feet  B/L.  Thickened skin at base of hallux  B/L.  Dry peeling skin left foot midfoot medially. Open lesion hallux  B/L at IPJ  hallux.  He said it occurred in shower this morning.  Diabetes with neuropathy  Onychomycosis  Hallux  B/L.   Patient says his skin dermatitis hallux  B/L is present.  He has scheduled appointment with his dermatologist. He says he has been waiting over 4 months for this appt.  Prescribe lotrisone for this patient.  Debride hallux  B/L.  RTC 4 months    Helane Gunther DPM

## 2023-04-22 DIAGNOSIS — Z794 Long term (current) use of insulin: Secondary | ICD-10-CM | POA: Diagnosis not present

## 2023-04-22 DIAGNOSIS — E1065 Type 1 diabetes mellitus with hyperglycemia: Secondary | ICD-10-CM | POA: Diagnosis not present

## 2023-04-23 NOTE — Progress Notes (Unsigned)
S:     No chief complaint on file.  47 y.o. male who presents for diabetes evaluation, education, and management.  Patient was referred and last seen by Primary Care Provider, Dr. Alvis Lemmings, on 12/16/22. Patient was last seen by Pharmacy clinic on 03/27/23. Saw [facility] on 04/22/23 for initial consult.   PMH is significant for PMH is significant for T1DM w/ hx of DKA, GERD, chronic diarrhea, NV, weight loss/protein-calorie malnutrition.    At last visit, CGM report showed time >250 43%, basaglar was reduced from 14 to 10 units daily and fiasp 4 units BID AC was initiated.   At visit with [facility] on 04/22/23, he reported ongoing polydipsia & nocturia. Basaglar was reduced to 8 units daily and he was given scale for novolog insulin, follow up with 2 weeks.   Today, Patient arrives in *** good spirits and presents without *** any assistance. ***Patient is accompanied by ***.   Family/Social History:  -Fhx: HTN, Lupus, DM, stroke -Tobacco: former smoker (quit in 2020) -Alcohol: none reported   Current diabetes medications include: basaglar 8 units daily, fiasp sliding scale w/ meals: <120 - no insulin 120-160 - take 2 units 161- 200 - take 3 units 201-250 - take 4 units 251-300 - take 4 units >301- take 5 units  Current hyperlipidemia medications include: atorvastatin 20 mg   Patient has had Diabetes for over 10 years.   Patient reports adherence to taking all medications as prescribed.  *** Patient denies adherence with medications, reports missing *** medications *** times per week, on average.  Do you feel that your medications are working for you? {YES NO:22349} Have you been experiencing any side effects to the medications prescribed? {YES NO:22349} Do you have any problems obtaining medications due to transportation or finances? {YES [id number] Insurance coverage: medicare/medicaid  Patient {Actions; denies-reports:120008} hypoglycemic events.  Reported home fasting  blood sugars: ***  Reported 2 hour post-meal/random blood sugars: ***.  Patient {Actions; denies-reports:120008} nocturia (nighttime urination).  Patient {Actions; denies-reports:120008} neuropathy (nerve pain). Patient {Actions; denies-reports:120008} visual changes. Patient {Actions; denies-reports:120008} self foot exams.   Patient reported dietary habits: Eats *** meals/day Breakfast: *** Lunch: *** Dinner: *** Snacks: *** Drinks: ***  Within the past 12 months, did you worry whether your food would run out before you got money to buy more? {YES NO:22349} Within the past 12 months, did the food you bought run out, and you didn't have money to get more? {YES NO:22349} PHQ-9 Score: ***  Patient-reported exercise habits: ***   O:   ROS  Physical Exam  7 day average blood glucose: ***  Libre3 *** CGM Download today *** on *** % Time CGM is active: ***% Average Glucose: *** mg/dL Glucose Management Indicator: ***  Glucose Variability: ***% (goal <36%) Time in Goal:  - Time in range 70-180: ***% - Time above range: ***% - Time below range: ***% Observed patterns:   Lab Results  Component Value Date   HGBA1C 11.3 (A) 12/16/2022   There were no vitals filed for this visit.  Lipid Panel     Component Value Date/Time   CHOL 107 12/19/2021 1215   TRIG 37 12/19/2021 1215   HDL 38 (L) 12/19/2021 1215   CHOLHDL 2.9 12/18/2020 1104   CHOLHDL 2.3 12/19/2015 0947   VLDL 12 12/19/2015 0947   LDLCALC 59 12/19/2021 1215    Clinical Atherosclerotic Cardiovascular Disease (ASCVD): No  The ASCVD Risk score (Arnett DK, et al., 2019) failed to calculate  for the following reasons:   The valid total cholesterol range is 130 to 320 mg/dL   Patient is participating in a Managed Medicaid Plan: No   A/P: Diabetes longstanding currently ***. Patient is able to verbalize appropriate hypoglycemia management plan. Medication adherence appears ***. Control is suboptimal due to  ***. -{Meds adjust:18428} basal insulin *** Lantus/Basaglar/Semglee (insulin glargine) *** Tresiba (insulin degludec) from *** units to *** units daily in the morning. Patient will continue to titrate 1 unit every *** days if fasting blood sugar > 100mg /dl until fasting blood sugars reach goal or next visit.  -{Meds adjust:18428} rapid insulin *** Novolog (insulin aspart) *** Humalog (insulin lispro) from *** to ***.  -Patient educated on purpose, proper use, and potential adverse effects of ***.  -Extensively discussed pathophysiology of diabetes, recommended lifestyle interventions, dietary effects on blood sugar control.  -Counseled on s/sx of and management of hypoglycemia.  -Next A1c anticipated ***.   ASCVD risk - primary ***secondary prevention in patient with diabetes. Last LDL is *** not at goal of <40 *** mg/dL. ASCVD risk factors include *** and 10-year ASCVD risk score of ***. {Desc; low/moderate/high:110033} intensity statin indicated.  -{Meds adjust:18428} ***statin *** mg.   Hypertension longstanding *** currently ***. Blood pressure goal of <130/80 *** mmHg. Medication adherence ***. Blood pressure control is suboptimal due to ***. -{Meds adjust:18428} *** mg.  Written patient instructions provided. Patient verbalized understanding of treatment plan.  Total time in face to face counseling *** minutes.    Follow-up:  Pharmacist *** PCP clinic visit in *** Patient seen with ***

## 2023-04-24 ENCOUNTER — Ambulatory Visit: Payer: Medicare Other | Admitting: Pharmacist

## 2023-04-29 ENCOUNTER — Ambulatory Visit: Payer: Medicare Other | Attending: Family Medicine

## 2023-04-29 VITALS — Ht 70.0 in | Wt 132.0 lb

## 2023-04-29 DIAGNOSIS — Z0000 Encounter for general adult medical examination without abnormal findings: Secondary | ICD-10-CM

## 2023-04-29 NOTE — Progress Notes (Signed)
Subjective:   Jared Murray is a 47 y.o. male who presents for Medicare Annual/Subsequent preventive examination.  Visit Complete: Virtual  I connected with  Jared Murray on 04/29/23 by a audio enabled telemedicine application and verified that I am speaking with the correct person using two identifiers.  Patient Location: Home  Provider Location: Home Office  I discussed the limitations of evaluation and management by telemedicine. The patient expressed understanding and agreed to proceed.  Vital Signs: Because this visit was a virtual/telehealth visit, some criteria may be missing or patient reported. Any vitals not documented were not able to be obtained and vitals that have been documented are patient reported.   Review of Systems     Cardiac Risk Factors include: diabetes mellitus;male gender     Objective:    Today's Vitals   04/29/23 1128  Weight: 132 lb (59.9 kg)  Height: 5\' 10"  (1.778 m)   Body mass index is 18.94 kg/m.     04/29/2023   11:31 AM 04/04/2022   12:00 PM 01/04/2022    8:03 AM 01/03/2022    9:53 PM 09/03/2021    1:06 PM 05/16/2020    9:25 AM 05/07/2020   11:53 AM  Advanced Directives  Does Patient Have a Medical Advance Directive? No No No No No No No  Would patient like information on creating a medical advance directive? Yes (MAU/Ambulatory/Procedural Areas - Information given) No - Patient declined No - Patient declined  No - Patient declined  No - Patient declined    Current Medications (verified) Outpatient Encounter Medications as of 04/29/2023  Medication Sig   Accu-Chek Softclix Lancets lancets Use to check blood sugar three times daily. E10.65   aspirin-sod bicarb-citric acid (ALKA-SELTZER) 325 MG TBEF tablet Take 325 mg by mouth every 6 (six) hours as needed (heartburn).   Blood Glucose Monitoring Suppl (ACCU-CHEK GUIDE) w/Device KIT Use to check blood sugar 3 times daily. E10.65   clobetasol cream (TEMOVATE) 0.05 % Apply 1  Application topically 2 (two) times daily.   clotrimazole-betamethasone (LOTRISONE) cream Apply 1 Application topically daily.   Continuous Glucose Receiver (DEXCOM G7 RECEIVER) DEVI Use to check blood sugar up to 4 times daily. E10.65   Continuous Glucose Sensor (DEXCOM G7 SENSOR) MISC Use to check blood sugar up to 4 times daily. Change sensors once every 10 days. E10.65   esomeprazole (NEXIUM) 40 MG capsule Take 1 capsule (40 mg total) by mouth 2 (two) times daily before a meal.   famotidine (PEPCID) 20 MG tablet Take 1 tablet (20 mg total) by mouth 2 (two) times daily.   glucose blood (ACCU-CHEK GUIDE) test strip Use to check blood sugar three times daily. E10.65   insulin aspart (FIASP FLEXTOUCH) 100 UNIT/ML FlexTouch Pen Inject 4 Units into the skin in the morning and at bedtime. Do not take if before-meal blood sugar is under 150.   Insulin Glargine (BASAGLAR KWIKPEN) 100 UNIT/ML Inject 10 Units into the skin at bedtime.   INSULIN SYRINGE 1CC/29G 29G X 1/2" 1 ML MISC use as directed (Patient taking differently: 1 each by Other route See admin instructions. use as directed)   sildenafil (VIAGRA) 50 MG tablet Take 1 tablet (50 mg total) by mouth daily as needed for erectile dysfunction (At least 24 hours between doses).   Tetrahydrozoline HCl (VISINE OP) Place 1 drop into both eyes daily as needed (dryness / allergies).   atorvastatin (LIPITOR) 20 MG tablet TAKE 1 TABLET (20 MG TOTAL) BY MOUTH  DAILY. (Patient taking differently: Take 20 mg by mouth daily.)   [DISCONTINUED] amoxicillin-clavulanate (AUGMENTIN) 875-125 MG tablet Take 1 tablet by mouth 2 (two) times daily. (Patient not taking: Reported on 04/04/2022)   No facility-administered encounter medications on file as of 04/29/2023.    Allergies (verified) Bee venom, Latex, and Penicillins   History: Past Medical History:  Diagnosis Date   Acute pericarditis 03/31/2016   AKI (acute kidney injury) (HCC) 01/22/2015   Allergy    Anxiety     per pt   Arthritis    Avascular necrosis (HCC) 02/12/2015   Avascular necrosis of bone of hip (HCC)    Chest wall abscess 04/03/2015   Chronic diarrhea of unknown origin 09/10/2017   Cutaneous abscess of chest wall    Depression    per pt   DKA (diabetic ketoacidoses)    DKA, type 1, not at goal [facility]) 04/03/2015   Eczema    Elevated liver enzymes 11/27/2018   GERD (gastroesophageal reflux disease)    HLD (hyperlipidemia) 11/25/2018   no meds   Hyperbilirubinemia    Hyperglycemia due to type 1 diabetes mellitus (HCC)    Hyperkalemia    Hypokalemia 03/17/2013   Left hip pain 04/18/2014   Loss of weight 03/17/2013   Marijuana use 11/25/2018   Osteoarthritis 12/24/2016   Prolonged QT interval 11/27/2018   in setting of RBBB and hypokalemia   Protein-calorie malnutrition, severe (HCC) 03/17/2013   Psoriasis    RBBB 12/23/2018   Chronic since at least 2014   Sepsis (HCC) 01/22/2015   Substance abuse (HCC)    Tobacco use disorder 09/13/2015   Type 1 diabetes mellitus (HCC)    Past Surgical History:  Procedure Laterality Date   COLONOSCOPY  2020   COLONOSCOPY WITH PROPOFOL N/A 05/07/2020   Procedure: COLONOSCOPY WITH PROPOFOL;  Surgeon: Lemar Lofty., MD;  Location[facility] ENDOSCOPY;  Service: Gastroenterology;  Laterality: N/A;   ENDOSCOPIC MUCOSAL RESECTION N/A 05/07/2020   Procedure: ENDOSCOPIC MUCOSAL RESECTION;  Surgeon: Meridee Score Netty Starring., MD;  Location: [facility] ENDOSCOPY;  Service: Gastroenterology;  Laterality: N/A;   FINGER SURGERY Right    middle finger   INCISION AND DRAINAGE ABSCESS Left 01/07/2022   Procedure: IRRIGATION AND DEBRIDEMENT OF LEFT HAND;  Surgeon: Teryl Lucy, MD;  Location: [facility] ORS;  Service: Orthopedics;  Laterality: Left;   SUBMUCOSAL LIFTING INJECTION  05/07/2020   Procedure: SUBMUCOSAL LIFTING INJECTION;  Surgeon: Meridee Score Netty Starring., MD;  Location: [facility] ENDOSCOPY;  Service: Gastroenterology;;   Family History  Problem Relation Age of Onset    Hypertension Father    Diabetes Other        multiple   Lupus Cousin    Stroke Maternal Grandmother    Stroke Paternal Grandmother    Colon cancer Neg Hx    Stomach cancer Neg Hx    Rectal cancer Neg Hx    Esophageal cancer Neg Hx    Colon polyps Neg Hx    Social History   Socioeconomic History   Marital status: Married    Spouse name: Not on file   Number of children: Not on file   Years of education: Not on file   Highest education level: Not on file  Occupational History   Not on file  Tobacco Use   Smoking status: Former    Current packs/day: 0.00    Average packs/day: 0.3 packs/day for 10.0 years (2.5 ttl pk-yrs)    Types: Cigarettes    Start date: 11/25/2008  Quit date: 11/26/2018    Years since quitting: 4.4   Smokeless tobacco: Never  Vaping Use   Vaping status: Never Used  Substance and Sexual Activity   Alcohol use: Not Currently    Alcohol/week: 1.0 - 2.0 standard drink of alcohol    Types: 1 - 2 Shots of liquor per week    Comment: none since 2020   Drug use: Yes    Types: Marijuana   Sexual activity: Yes  Other Topics Concern   Not on file  Social History Narrative   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, physiological   Married   Social Determinants of Corporate investment banker Strain: Low Risk  (04/29/2023)   Overall Financial Resource Strain (CARDIA)    Difficulty of Paying Living Expenses: Not hard at all  Food Insecurity: No Food Insecurity (04/29/2023)   Hunger Vital Sign    Worried About Running Out of Food in the Last Year: Never true    Ran Out of Food in the Last Year: Never true  Transportation Needs: No Transportation Needs (04/29/2023)   PRAPARE - Administrator, Civil Service (Medical): No    Lack of Transportation (Non-Medical): No  Physical Activity: Sufficiently Active (04/29/2023)   Exercise Vital Sign    Days of Exercise per Week: 5 days    Minutes of Exercise per Session: 30 min  Stress: No Stress Concern Present  (04/29/2023)   [employer] of Occupational Health - Occupational Stress Questionnaire    Feeling of Stress : Not at all  Social Connections: Socially Integrated (04/29/2023)   Social Connection and Isolation Panel [NHANES]    Frequency of Communication with Friends and Family: More than three times a week    Frequency of Social Gatherings with Friends and Family: Three times a week    Attends Religious Services: 1 to 4 times per year    Active Member of Clubs or Organizations: No    Attends Engineer, structural: More than 4 times per year    Marital Status: Married    Tobacco Counseling Counseling given: Not Answered   Clinical Intake:  Pre-visit preparation completed: Yes  Pain : No/denies pain     Diabetes: Yes CBG done?: No Did pt. bring in CBG monitor from home?: No  How often do you need to have someone help you when you read instructions, pamphlets, or other written materials from your doctor or pharmacy?: 1 - Never  Interpreter Needed?: No  Information entered by :: Kandis Fantasia LPN   Activities of Daily Living    04/29/2023   11:28 AM  In your present state of health, do you have any difficulty performing the following activities:  Hearing? 0  Vision? 0  Difficulty concentrating or making decisions? 0  Walking or climbing stairs? 0  Dressing or bathing? 0  Doing errands, shopping? 0  Preparing Food and eating ? N  Using the Toilet? N  In the past six months, have you accidently leaked urine? N  Do you have problems with loss of bowel control? N  Managing your Medications? N  Managing your Finances? N  Housekeeping or managing your Housekeeping? N    Patient Care Team: Hoy Register, MD as PCP - General (Family Medicine) Clarene Duke, Karma Lew, RN as [facility] Care Management  Indicate any recent Medical Services you may have received from other than [facility] providers in the past year (date may be approximate).  Assessment:    This is a routine wellness examination for Fernand.  Hearing/Vision screen Hearing Screening - Comments:: Denies hearing difficulties   Vision Screening - Comments:: up to date with routine eye exams with Dr. Nile Riggs     Goals Addressed             This Visit's Progress    COMPLETED: To improve hip pain       Care Coordination Interventions: Reviewed provider established plan for pain management Discussed importance of adherence to all scheduled medical appointments Counseled on the importance of reporting any/all new or changed pain symptoms or management strategies to pain management provider Advised patient to report to care team affect of pain on daily activities Reviewed with patient prescribed pharmacological and nonpharmacological pain relief strategies Assessed social determinant of health barriers Educated patient about PREP (provider referral exercise program), mailed brochure to patient for review and consideration         Depression Screen    04/29/2023   11:30 AM 09/17/2022    4:04 PM 04/04/2022   12:00 PM 12/19/2021   11:37 AM 10/10/2021    3:06 PM 12/18/2020   10:26 AM 05/16/2020    9:31 AM  PHQ 2/9 Scores  PHQ - 2 Score 2 3 0 2 2 2 3   PHQ- 9 Score 3 6  8 4 8 11     Fall Risk    04/29/2023   11:32 AM 12/16/2022    3:31 PM 09/17/2022    4:00 PM 04/04/2022   12:00 PM 12/19/2021   11:37 AM  Fall Risk   Falls in the past year? 0 0 0 0 0  Number falls in past yr: 0 0 0 0 0  Injury with Fall? 0 0 0 0 0  Risk for fall due to : No Fall Risks No Fall Risks  No Fall Risks   Follow up Falls prevention discussed;Education provided;Falls evaluation completed        MEDICARE RISK AT HOME: Medicare Risk at Home Any stairs in or around the home?: No If so, are there any without handrails?: No Home free of loose throw rugs in walkways, pet beds, electrical cords, etc?: Yes Adequate lighting in your home to reduce risk of falls?: Yes Life alert?: No Use of a cane,  walker or w/c?: No Grab bars in the bathroom?: Yes Shower chair or bench in shower?: No Elevated toilet seat or a handicapped toilet?: No  TIMED UP AND GO:  Was the test performed?  No    Cognitive Function:    04/04/2022   12:02 PM  MMSE - Mini Mental State Exam  Orientation to time 5  Orientation to Place 5  Registration 3  Attention/ Calculation 5  Recall 3  Language- name 2 objects 2  Language- repeat 1  Language- follow 3 step command 3  Language- read & follow direction 1  Write a sentence 1  Copy design 1  Total score 30        04/29/2023   11:32 AM 04/04/2022   12:03 PM  6CIT Screen  What Year? 0 points 0 points  What month? 0 points 0 points  What time? 0 points 0 points  Count back from 20 0 points 0 points  Months in reverse 0 points 0 points  Repeat phrase 0 points 0 points  Total Score 0 points 0 points    Immunizations Immunization History  Administered Date(s) Administered   Influenza,inj,Quad PF,6+ Mos 05/16/2015, 07/14/2017  Pneumococcal Polysaccharide-23 03/18/2013, 01/26/2014    TDAP status: Due, Education has been provided regarding the importance of this vaccine. Advised may receive this vaccine at local pharmacy or Health Dept. Aware to provide a copy of the vaccination record if obtained from local pharmacy or Health Dept. Verbalized acceptance and understanding.  Flu Vaccine status: Declined, Education has been provided regarding the importance of this vaccine but patient still declined. Advised may receive this vaccine at local pharmacy or Health Dept. Aware to provide a copy of the vaccination record if obtained from local pharmacy or Health Dept. Verbalized acceptance and understanding.  Pneumococcal vaccine status: Up to date  Covid-19 vaccine status: Information provided on how to obtain vaccines.   Qualifies for Shingles Vaccine? No    Screening Tests Health Maintenance  Topic Date Due   COVID-19 Vaccine (1) Never done    DTaP/Tdap/Td (1 - Tdap) Never done   FOOT EXAM  06/20/2022   Diabetic kidney evaluation - Urine ACR  12/20/2022   INFLUENZA VACCINE  11/16/2023 (Originally 03/19/2023)   HEMOGLOBIN A1C  06/17/2023   Diabetic kidney evaluation - eGFR measurement  09/18/2023   OPHTHALMOLOGY EXAM  11/24/2023   Medicare Annual Wellness (AWV)  04/28/2024   Colonoscopy  05/07/2030   Hepatitis C Screening  Completed   HIV Screening  Completed   HPV VACCINES  Aged Out    Health Maintenance  Health Maintenance Due  Topic Date Due   COVID-19 Vaccine (1) Never done   DTaP/Tdap/Td (1 - Tdap) Never done   FOOT EXAM  06/20/2022   Diabetic kidney evaluation - Urine ACR  12/20/2022    Colorectal cancer screening: Type of screening: Colonoscopy. Completed 05/07/20. Repeat every 10 years  Lung Cancer Screening: (Low Dose CT Chest recommended if Age 36-80 years, 20 pack-year currently smoking OR have quit w/in 15years.) does not qualify.   Lung Cancer Screening Referral: n/a  Additional Screening:  Hepatitis C Screening: does qualify; Completed 01/22/15  Vision Screening: Recommended annual ophthalmology exams for early detection of glaucoma and other disorders of the eye. Is the patient up to date with their annual eye exam?  Yes  Who is the provider or what is the name of the office in which the patient attends annual eye exams? Dr. Nile Riggs  If pt is not established with a provider, would they like to be referred to a provider to establish care? No .   Dental Screening: Recommended annual dental exams for proper oral hygiene  Diabetic Foot Exam: Diabetic Foot Exam: Overdue, Pt has been advised about the importance in completing this exam. Pt is scheduled for diabetic foot exam on at next office visit.  Community Resource Referral / Chronic Care Management: CRR required this visit?  No   CCM required this visit?  No     Plan:     I have personally reviewed and noted the following in the patient's chart:    Medical and social history Use of alcohol, tobacco or illicit drugs  Current medications and supplements including opioid prescriptions. Patient is not currently taking opioid prescriptions. Functional ability and status Nutritional status Physical activity Advanced directives List of other physicians Hospitalizations, surgeries, and ER visits in previous 12 months Vitals Screenings to include cognitive, depression, and falls Referrals and appointments  In addition, I have reviewed and discussed with patient certain preventive protocols, quality metrics, and best practice recommendations. A written personalized care plan for preventive services as well as general preventive health recommendations were provided to  patient.     Kandis Fantasia [place], [place]   11/18/4740   After Visit Summary: (Mail) Due to this being a telephonic visit, the after visit summary with patients personalized plan was offered to patient via mail   Nurse Notes: No concerns at this time

## 2023-04-29 NOTE — Patient Instructions (Signed)
Jared Murray , Thank you for taking time to come for your Medicare Wellness Visit. I appreciate your ongoing commitment to your health goals. Please review the following plan we discussed and let me know if I can assist you in the future.   Referrals/Orders/Follow-Ups/Clinician Recommendations: Aim for 30 minutes of exercise or brisk walking, 6-8 glasses of water, and 5 servings of fruits and vegetables each day.  This is a list of the screening recommended for you and due dates:  Health Maintenance  Topic Date Due   COVID-19 Vaccine (1) Never done   DTaP/Tdap/Td vaccine (1 - Tdap) Never done   Complete foot exam   06/20/2022   Yearly kidney health urinalysis for diabetes  12/20/2022   Flu Shot  11/16/2023*   Hemoglobin A1C  06/17/2023   Yearly kidney function blood test for diabetes  09/18/2023   Eye exam for diabetics  11/24/2023   Medicare Annual Wellness Visit  04/28/2024   Colon Cancer Screening  05/07/2030   Hepatitis C Screening  Completed   HIV Screening  Completed   HPV Vaccine  Aged Out  *Topic was postponed. The date shown is not the original due date.    Advanced directives: (Provided) Advance directive discussed with you today. I have provided a copy for you to complete at home and have notarized. Once this is complete, please bring a copy in to our office so we can scan it into your chart.   Next Medicare Annual Wellness Visit scheduled for next year: Yes

## 2023-05-04 DIAGNOSIS — Z794 Long term (current) use of insulin: Secondary | ICD-10-CM | POA: Diagnosis not present

## 2023-05-04 DIAGNOSIS — E1065 Type 1 diabetes mellitus with hyperglycemia: Secondary | ICD-10-CM | POA: Diagnosis not present

## 2023-05-06 ENCOUNTER — Encounter: Payer: Self-pay | Admitting: Pharmacist

## 2023-05-06 ENCOUNTER — Ambulatory Visit: Payer: Medicare Other | Attending: Family Medicine | Admitting: Pharmacist

## 2023-05-06 ENCOUNTER — Other Ambulatory Visit: Payer: Self-pay

## 2023-05-06 DIAGNOSIS — M329 Systemic lupus erythematosus, unspecified: Secondary | ICD-10-CM | POA: Diagnosis not present

## 2023-05-06 DIAGNOSIS — Z87891 Personal history of nicotine dependence: Secondary | ICD-10-CM | POA: Diagnosis not present

## 2023-05-06 DIAGNOSIS — E46 Unspecified protein-calorie malnutrition: Secondary | ICD-10-CM | POA: Diagnosis not present

## 2023-05-06 DIAGNOSIS — Z8673 Personal history of transient ischemic attack (TIA), and cerebral infarction without residual deficits: Secondary | ICD-10-CM | POA: Diagnosis not present

## 2023-05-06 DIAGNOSIS — E1065 Type 1 diabetes mellitus with hyperglycemia: Secondary | ICD-10-CM | POA: Diagnosis not present

## 2023-05-06 DIAGNOSIS — Z794 Long term (current) use of insulin: Secondary | ICD-10-CM

## 2023-05-06 DIAGNOSIS — I10 Essential (primary) hypertension: Secondary | ICD-10-CM | POA: Diagnosis not present

## 2023-05-06 MED ORDER — BASAGLAR KWIKPEN 100 UNIT/ML ~~LOC~~ SOPN
12.0000 [IU] | PEN_INJECTOR | Freq: Every day | SUBCUTANEOUS | 3 refills | Status: AC
Start: 2023-05-06 — End: ?
  Filled 2023-05-06 – 2023-08-07 (×2): qty 15, 125d supply, fill #0

## 2023-05-06 NOTE — Progress Notes (Signed)
S:    No chief complaint on file.  47 y.o. male who presents for diabetes evaluation, education, and management. PMH is significant for T1DM w/ hx of DKA, chronic diarrhea, NV, weight loss/protein-calorie malnutrition. Patient's chart reports diabetes was diagnosed in 2016.   Patient was last seen by Primary Care Provider, Dr. Alvis Lemmings, on 12/16/2022. He was referred 03/23/2023 by our Care Management team for assistance with blood sugar control. Last seen by pharmacist on 03/27/2023.    At last pharmacist visit, Basaglar decreased to 10u daily in the morning and Fiasp 4u BID before meals was initatied with instructions to hold if preprandial glucose is under 150. A1c of 9.8% from [facility] on 04/22/2023. Patient also had an appointment with endocrinologist, Dr. Katrinka Blazing, on 05/04/2023 and recommended Basaglar 10 units daily and SSI Fiasp (<120 - no insulin, 120-160 - take 3 units, 161- 200 - take 4 units, 201-250 - take 5 units, 251-300 - take 5 units, 301- 350 - take 6 units, >351 - take 7 units).  Patient arrives in good spirits and presents without any assistance.   Family/Social History:  -Fhx: HTN, Lupus, DM, stroke -Tobacco: former smoker (quit in 2020) -Alcohol: none reported   Current diabetes medications include: Basaglar to 10u daily in the morning, SSI Fiasp before meals  Current cholesterol medications include: atorvastatin 20 mg daily  Patient reports adherence to taking all medications as prescribed.  -Of note, pt did report two times of missed doses d/t insulin coming out of injection site post-injection, however, patient took additional insulin later on those days -Expressed that insulin pens are not giving him trouble now and he feels comfortable continuing pens  Insurance coverage: Medicare   Patient denies hypoglycemic events.  Patient denies nocturia (nighttime urination).  Patient denies neuropathy (nerve pain). Patient denies visual changes. Patient reports self foot exams.    Patient reported dietary habits: Eats 2 meals/day -Usually lunch and dinner   Patient-reported exercise habits: none   O:  Dexcom G7 CGM Download today (05/06/2023) - 14-day summary Average Glucose: 328 mg/dL Glucose Management Indicator: 11.2% Time in Goal:  - Time in range 70-180: 5%  - Time above range: 23% -Time >250: 70% - Time below range: 5% Observed patterns:    Lab Results  Component Value Date   HGBA1C 11.3 (A) 12/16/2022   There were no vitals filed for this visit.  Lipid Panel     Component Value Date/Time   CHOL 107 12/19/2021 1215   TRIG 37 12/19/2021 1215   HDL 38 (L) 12/19/2021 1215   CHOLHDL 2.9 12/18/2020 1104   CHOLHDL 2.3 12/19/2015 0947   VLDL 12 12/19/2015 0947   LDLCALC 59 12/19/2021 1215    Clinical Atherosclerotic Cardiovascular Disease (ASCVD): No  The ASCVD Risk score (Arnett DK, et al., 2019) failed to calculate for the following reasons:   The valid total cholesterol range is 130 to 320 mg/dL   Patient is participating in a Managed Medicaid Plan: No   A/P: Diabetes longstanding, currently uncontrolled. Patient is able to verbalize appropriate hypoglycemia management plan. Medication adherence appears okay. Control is suboptimal due to lack of optimized regimen and recent decrease in basal insulin.  -Increased dose of Basaglar to 12u daily in the morning.  -Continue Fiasp SSI dosing as recommended by Dr. Katrinka Blazing -Extensively discussed pathophysiology of diabetes, recommended lifestyle interventions, dietary effects on blood sugar control.  -Counseled on s/sx of and management of hypoglycemia.  -Next A1c anticipated 07/2023.   ASCVD risk -  secondary prevention in patient with diabetes. Last LDL is 59 at goal of <70 mg/dL. ASCVD risk factors include DM. Moderate intensity statin indicated.  -Continued atorvastatin 20 mg   Written patient instructions provided. Patient verbalized understanding of treatment plan.  Total time in face to  face counseling 30 minutes.    Follow-up:  Pharmacist on 06/05/2023 PCP clinic visit on 05/04/2024  Roslyn Smiling, PharmD PGY1 Pharmacy Resident 05/06/2023 5:03 PM

## 2023-05-18 ENCOUNTER — Ambulatory Visit (INDEPENDENT_AMBULATORY_CARE_PROVIDER_SITE_OTHER): Payer: Medicare Other | Admitting: Dermatology

## 2023-05-18 ENCOUNTER — Encounter: Payer: Self-pay | Admitting: Dermatology

## 2023-05-18 VITALS — BP 95/64

## 2023-05-18 DIAGNOSIS — L2081 Atopic neurodermatitis: Secondary | ICD-10-CM | POA: Diagnosis not present

## 2023-05-18 DIAGNOSIS — L668 Other cicatricial alopecia: Secondary | ICD-10-CM

## 2023-05-18 DIAGNOSIS — D492 Neoplasm of unspecified behavior of bone, soft tissue, and skin: Secondary | ICD-10-CM

## 2023-05-18 MED ORDER — CLOBETASOL PROPIONATE 0.05 % EX OINT: 1.0000 | TOPICAL_OINTMENT | CUTANEOUS | 1 refills | Status: DC

## 2023-05-18 MED ORDER — BETAMETHASONE DIPROPIONATE 0.05 % EX OINT: TOPICAL_OINTMENT | CUTANEOUS | 2 refills | Status: DC

## 2023-05-18 MED ORDER — TACROLIMUS 0.1 % EX OINT: TOPICAL_OINTMENT | CUTANEOUS | 1 refills | Status: DC

## 2023-05-18 NOTE — Patient Instructions (Addendum)
Hello Jared Murray,  Thank you for visiting my office today. Your dedication to addressing your skin concerns and improving your overall health is greatly appreciated.  Here is a summary of the key instructions from today's consultation:  - Biopsy: Another biopsy of your scalp will be performed to further investigate the possibility of lupus or other skin conditions.  - Medications:   - Clobetasol Ointment: Apply to your scalp, hands, elbows, and feet twice daily for two weeks.   - Tacrolimus Ointment: After two weeks on Clobetasol, switch to Tacrolimus ointment. Apply every morning and night for another two weeks. Continue alternating between these two treatments.  - Follow-up Appointments:   - We will schedule a follow-up to remove your sutures and discuss the biopsy results in 2 weeks.   - Continue with the prescribed treatment regimen until our next appointment.  Please ensure to follow the treatment plan as outlined and keep all scheduled appointments to achieve the best possible outcome. If you have any questions or concerns before our next meeting, please do not hesitate to contact our office.  Warm regards,  Dr. Langston Reusing, Dermatology Patient Handout: Wound Care for Skin Biopsy Site  Taking Care of Your Skin Biopsy Site  Proper care of the biopsy site is essential for promoting healing and minimizing scarring. This handout provides instructions on how to care for your biopsy site to ensure optimal recovery.  1. Cleaning the Wound:  Clean the biopsy site daily with gentle soap and water. Gently pat the area dry with a clean, soft towel. Avoid harsh scrubbing or rubbing the area, as this can irritate the skin and delay healing.  2. Applying Aquaphor and Bandage:  After cleaning the wound, apply a thin layer of Aquaphor ointment to the biopsy site. Cover the area with a sterile bandage to protect it from dirt, bacteria, and friction. Change the bandage daily or as needed  if it becomes soiled or wet.  3. Continued Care for One Week:  Repeat the cleaning, Aquaphor application, and bandaging process daily for one week following the biopsy procedure. Keeping the wound clean and moist during this initial healing period will help prevent infection and promote optimal healing.  4. Massaging Aquaphor into the Area:  ---After one week, discontinue the use of bandages but continue to apply Aquaphor to the biopsy site. ----Gently massage the Aquaphor into the area using circular motions. ---Massaging the skin helps to promote circulation and prevent the formation of scar tissue.   Additional Tips:  Avoid exposing the biopsy site to direct sunlight during the healing process, as this can cause hyperpigmentation or worsen scarring. If you experience any signs of infection, such as increased redness, swelling, warmth, or drainage from the wound, contact your healthcare provider immediately. Follow any additional instructions provided by your healthcare provider for caring for the biopsy site and managing any discomfort. Conclusion:  Taking proper care of your skin biopsy site is crucial for ensuring optimal healing and minimizing scarring. By following these instructions for cleaning, applying Aquaphor, and massaging the area, you can promote a smooth and successful recovery. If you have any questions or concerns about caring for your biopsy site, don't hesitate to contact your healthcare provider for guidance.     Important Information  Due to recent changes in healthcare laws, you may see results of your pathology and/or laboratory studies on MyChart before the doctors have had a chance to review them. We understand that in some cases there may be  results that are confusing or concerning to you. Please understand that not all results are received at the same time and often the doctors may need to interpret multiple results in order to provide you with the best plan of  care or course of treatment. Therefore, we ask that you please give [place] 2 business days to thoroughly review all your results before contacting the office for clarification. Should we see a critical lab result, you will be contacted sooner.   If You Need Anything After Your Visit  If you have any questions or concerns for your doctor, please call our main line at [phone] If no one answers, please leave a voicemail as directed and we will return your call as soon as possible. Messages left after 4 pm will be answered the following business day.   You may also send [place] a message via MyChart. We typically respond to MyChart messages within 1-2 business days.  For prescription refills, please ask your pharmacy to contact our office. Our fax number is [phone].  If you have an urgent issue when the clinic is closed that cannot wait until the next business day, you can page your doctor at the number below.    Please note that while we do our best to be available for urgent issues outside of office hours, we are not available 24/7.   If you have an urgent issue and are unable to reach [place], you may choose to seek medical care at your doctor's office, retail clinic, urgent care center, or emergency room.  If you have a medical emergency, please immediately call 911 or go to the emergency department. In the event of inclement weather, please call our main line at [phone] for an update on the status of any delays or closures.  Dermatology Medication Tips: Please keep the boxes that topical medications come in in order to help keep track of the instructions about where and how to use these. Pharmacies typically print the medication instructions only on the boxes and not directly on the medication tubes.   If your medication is too expensive, please contact our office at [phone] or send [place] a message through MyChart.   We are unable to tell what your co-pay for medications will be in  advance as this is different depending on your insurance coverage. However, we may be able to find a substitute medication at lower cost or fill out paperwork to get insurance to cover a needed medication.   If a prior authorization is required to get your medication covered by your insurance company, please allow [place] 1-2 business days to complete this process.  Drug prices often vary depending on where the prescription is filled and some pharmacies may offer cheaper prices.  The website www.goodrx.com contains coupons for medications through different pharmacies. The prices here do not account for what the cost may be with help from insurance (it may be cheaper with your insurance), but the website can give you the price if you did not use any insurance.  - You can print the associated coupon and take it with your prescription to the pharmacy.  - You may also stop by our office during regular business hours and pick up a GoodRx coupon card.  - If you need your prescription sent electronically to a different pharmacy, notify our office through [facility] or by phone at [phone]

## 2023-05-18 NOTE — Progress Notes (Signed)
   New Patient Visit   Subjective  Jared Murray is a 47 y.o. 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   The following portions of the chart were reviewed this encounter and updated as appropriate: medications, allergies, medical history  Review of Systems:  No other skin or systemic complaints except as noted in HPI or Assessment and Plan.  Objective  Well appearing patient in no apparent distress; mood and affect are within normal limits.   A focused examination was performed of the following areas: Scalp, arms, feet  Relevant exam findings are noted in the Assessment and Plan.  Scalp Large atrophic plaque with significant erythema and central ulceration hypopigmentation on border            Assessment & Plan    Neoplasm of skin Scalp  Skin / nail biopsy Type of biopsy: punch   Informed consent: discussed and consent obtained   Anesthesia: the lesion was anesthetized in a standard fashion   Anesthesia comment:  Area prepped with alcohol Anesthetic:  1% lidocaine w/ epinephrine 1-100,000 buffered w/ 8.4% NaHCO3 Punch size:  3 mm Suture size:  3-0 Suture type: nylon   Suture type comment:  X 2 sutures Hemostasis achieved with: suture and pressure   Outcome: patient tolerated procedure well   Post-procedure details: wound care instructions given   Post-procedure details comment:  Ointment and small bandage applied  Atopic neurodermatitis  Related Procedures ANA W/Rfx to all if Positive  DERMATITIS Exam: Mild scaly plaques on neck, elbows hands and feet background of hypopigmentation on feet elbows 20% BSA  Chronic and persistent condition with duration or expected duration over one year. Condition is bothersome/symptomatic for patient. Currently  flared.   Atopic dermatitis (eczema) is a chronic, relapsing, pruritic condition that can significantly affect quality of life. It is often associated with allergic rhinitis and/or asthma and can require treatment with topical medications, phototherapy, or in severe cases biologic injectable medication (Dupixent; Adbry) or Oral JAK inhibitors.  Treatment Plan: Start Clobetasol oint bid x 2 weeks to aa scalp, hands, feet, pt will use 2 weeks on, 2 weeks off, alternating with Tacrolimus oint, avoid f/g/a Start Tacrolimus 0.1% oint bid 2 weeks on, 2 wks off, alternating with the Clobetasol cr  Topical steroids (such as triamcinolone, fluocinolone, fluocinonide, mometasone, clobetasol, halobetasol, betamethasone, hydrocortisone) can cause thinning and lightening of the skin if they are used for too long in the same area. Your physician has selected the right strength medicine for your problem and area affected on the body. Please use your medication only as directed by your physician to prevent side effects.    Recommend gentle skin care.   Return in about 2 weeks (around 06/01/2023) for suture removal and f/u.  I, Ardis Rowan, RMA, am acting as scribe for [employer], DO .   Documentation: I have reviewed the above documentation for accuracy and completeness, and I agree with the above.  Langston Reusing, DO

## 2023-05-20 DIAGNOSIS — L2081 Atopic neurodermatitis: Secondary | ICD-10-CM | POA: Diagnosis not present

## 2023-05-20 LAB — SURGICAL PATHOLOGY

## 2023-05-21 ENCOUNTER — Other Ambulatory Visit: Payer: Self-pay

## 2023-05-21 LAB — ANA W/RFX TO ALL IF POSITIVE: Anti Nuclear Antibody (ANA): NEGATIVE

## 2023-05-21 NOTE — Progress Notes (Signed)
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  These means that the lupus is only involving the skin and is not systemic. I will discuss these results in detail at your upcoming office visit when we remove your sutures.  Best,  Dr. Onalee Hua

## 2023-05-25 ENCOUNTER — Ambulatory Visit: Payer: Self-pay

## 2023-05-25 NOTE — Patient Outreach (Signed)
  Care Coordination   Follow Up Visit Note   05/25/2023 Name: Jared Murray MRN: [id number] DOB: August 20, 1975  Jared Murray is a 47 y.o. year old male who sees Hoy Register, MD for primary care. I spoke with  [name] by phone today.  What matters to the patients health and wellness today?  Patient will consider establishing a routine exercise regimen. He will consult with a nutritionist to help with dietary needs for his type 1 dm.     Goals Addressed             This Visit's Progress    To lower A1c <7.5%   On track    Care Coordination Interventions: Evaluation of current treatment plan related to type 1 diabetes mellitus and patient's adherence to plan as established by provider Determined patient has established with Endocrinology, completing is initial and subsequent visits for evaluation of his type 1 dm Review of patient status, including review of consultants reports, relevant laboratory and other test results, and medications completed  Counseled on Diabetic diet, my plate method, 409 minutes of moderate intensity exercise/week Mailed printed educational brochure related to the PREP program Reviewed and discussed referral for Nutritionist, reviewed and discussed with patient his initial visit is scheduled for 07/23/23 @2 :00 PM Assessed patient's understanding of A1c goal: <7% Lab Results  Component Value Date   HGBA1C 9.8 (A) 04/22/2023      To schedule appointment with Dermatology for evaluation of psoriasis   On track    Care Coordination Interventions: Evaluation of current treatment plan related to Psoriasis and patient's adherence to plan as established by provider Determined patient completed his initial follow up with Dermatology for evaluation of eczema of his scalp Reviewed and discussed with patient he is adhering to use of a prescribed ointment for his scalp and this medication is effective for his symptoms Reviewed and discussed with patient a  tissue sample was taken from his scalp for further evaluation and his results will be discussed at his next scheduled visit Reviewed and discussed with patient his upcoming scheduled f/u with Dermatologist, Dr. Onalee Hua scheduled for 06/01/23 @11 :15 AM    Interventions Today    Flowsheet Row Most Recent Value  Chronic Disease   Chronic disease during today's visit Diabetes, Other  [Eczema,  chronic hip back]  General Interventions   General Interventions Discussed/Reviewed General Interventions Discussed, General Interventions Reviewed, Doctor Visits, Labs  Doctor Visits Discussed/Reviewed Doctor Visits Discussed, Doctor Visits Reviewed, Specialist  Exercise Interventions   Exercise Discussed/Reviewed Physical Activity, Exercise Reviewed, Exercise Discussed  Physical Activity Discussed/Reviewed Physical Activity Discussed, Physical Activity Reviewed, PREP, Types of exercise  Education Interventions   Education Provided Provided Education, Provided Printed Education  Provided Verbal Education On Nutrition, Labs, Exercise, When to see the doctor, Medication  Labs Reviewed Hgb A1c  Nutrition Interventions   Nutrition Discussed/Reviewed Nutrition Discussed, Nutrition Reviewed  [referral for Nutritionist]  Pharmacy Interventions   Pharmacy Dicussed/Reviewed Pharmacy Topics Reviewed, Pharmacy Topics Discussed, Medications and their functions            SDOH assessments and interventions completed:  No     Care Coordination Interventions:  Yes, provided   Follow up plan: Follow up call scheduled for 07/27/23 @11 :00 AM    Encounter Outcome:  Patient Visit Completed

## 2023-05-25 NOTE — Patient Instructions (Signed)
Visit Information  Thank you for taking time to visit with me today. Please don't hesitate to contact me if I can be of assistance to you.   Following are the goals we discussed today:   Goals Addressed             This Visit's Progress    To lower A1c <7.5%   On track    Care Coordination Interventions: Evaluation of current treatment plan related to type 1 diabetes mellitus and patient's adherence to plan as established by provider Determined patient has established with Endocrinology, completing is initial and subsequent visits for evaluation of his type 1 dm Review of patient status, including review of consultants reports, relevant laboratory and other test results, and medications completed  Counseled on Diabetic diet, my plate method, 409 minutes of moderate intensity exercise/week Mailed printed educational brochure related to the PREP program Reviewed and discussed referral for Nutritionist, reviewed and discussed with patient his initial visit is scheduled for 07/23/23 @2 :00 PM Assessed patient's understanding of A1c goal: <7% Lab Results  Component Value Date   HGBA1C 9.8 (A) 04/22/2023       To schedule appointment with Dermatology for evaluation of psoriasis   On track    Care Coordination Interventions: Evaluation of current treatment plan related to Psoriasis and patient's adherence to plan as established by provider Determined patient completed his initial follow up with Dermatology for evaluation of eczema of his scalp Reviewed and discussed with patient he is adhering to use of a prescribed ointment for his scalp and this medication is effective for his symptoms Reviewed and discussed with patient a tissue sample was taken from his scalp for further evaluation and his results will be discussed at his next scheduled visit Reviewed and discussed with patient his upcoming scheduled f/u with Dermatologist, Dr. Onalee Hua scheduled for 06/01/23 @11 :15 AM        Our next  appointment is by telephone on 07/27/23 at 11:00 AM  Please call the care guide team at [phone] if you need to cancel or reschedule your appointment.   If you are experiencing a Mental Health or Behavioral Health Crisis or need someone to talk to, please call 1-800-273-TALK (toll free, 24 hour hotline)  Patient verbalizes understanding of instructions and care plan provided today and agrees to view in MyChart. Active MyChart status and patient understanding of how to access instructions and care plan via MyChart confirmed with patient.     Delsa Sale RN BSN CCM [place]  [facility], [facility] Nurse Care Coordinator  Direct Dial: [phone] Website: [email or web address]

## 2023-06-01 ENCOUNTER — Ambulatory Visit: Payer: Medicare Other | Admitting: Dermatology

## 2023-06-01 ENCOUNTER — Encounter: Payer: Self-pay | Admitting: Dermatology

## 2023-06-01 VITALS — BP 91/59

## 2023-06-01 DIAGNOSIS — L930 Discoid lupus erythematosus: Secondary | ICD-10-CM

## 2023-06-01 DIAGNOSIS — L853 Xerosis cutis: Secondary | ICD-10-CM | POA: Diagnosis not present

## 2023-06-01 NOTE — Progress Notes (Signed)
   Follow-Up Visit   Subjective  Jared Murray is a 47 y.o. male who presents for the following: Biopsy follow up - Discoid Lupus (negative ANA) - He is using clobetasol once or twice a day and it started helping right away.   The following portions of the chart were reviewed this encounter and updated as appropriate: medications, allergies, medical history  Review of Systems:  No other skin or systemic complaints except as noted in HPI or Assessment and Plan.  Objective  Well appearing patient in no apparent distress; mood and affect are within normal limits.   A focused examination was performed of the following areas: Scalp  Relevant exam findings are noted in the Assessment and Plan.  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 -Discussed labs and pathology report in detail ANA was negative, ruling out systemic lupus - Plan:   a. Continue alternating topical steroids: betamethasone and tacrolimus (two weeks on each, then switch) for a few months until the condition calms down.   b. Apply the same ointments on the scalp.   c. Consider oral medication if creams are insufficient.   d. If the scalp stops responding or becomes painful, patient should come in sooner for possible steroid injections.   e. Follow-up in three months or sooner if needed.  2. Dry Skin on Hands and Legs - Assessment: Improvement observed, but not yet clear. - Plan:   a. Use moisturizers throughout the  day as needed, especially on hands.   b. Provide samples of moisturizers for the patient to try and purchase at local stores.   c. Continue using prescribed topical steroids as directed for discoid lupus.   Discoid lupus erythematosus    Return in about 3 months (around 09/01/2023).  I, Joanie Coddington, CMA, am acting as scribe for [employer], DO .   Documentation: I have reviewed the above documentation for accuracy and completeness, and I agree with the above.  Langston Reusing, DO   .

## 2023-06-01 NOTE — Patient Instructions (Signed)
Hello Habib,  Thank you for visiting my office today. Your dedication to improving your health is commendable, and it's encouraging to see the progress you've made. Here is a summary of the key instructions from today's consultation:  Diagnosis: Discoid Lupus  - Betamethasone Ointment: Continue using betamethasone ointment on your body and scalp for the next two weeks.  - Tacrolimus Ointment: After the initial two weeks, switch to tacrolimus ointment for another two weeks. Following this, alternate back to betamethasone.  - Moisturizer: Apply moisturizer regularly, especially on your hands. We have provided samples for you to try and find the best fit.  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 Blood tests show it is not systemic.  - Treatment Response: If your scalp or other treated areas stop responding to the treatment or become painful, please schedule an earlier visit.  - Follow-Up: Follow up in three months, or sooner if necessary. It's important to continue using the prescribed creams diligently for the best results.  If you have any questions or need further assistance, feel free to send [place] a message through MyChart.  Best regards,  Dr. [facility]   Important Information  Due to recent changes in healthcare laws, you may see results of your pathology and/or laboratory studies on MyChart before the doctors have had a chance to review them. We understand that in some cases there may be results that are confusing or concerning to you. Please understand that not all results are received at the same time and often the doctors may need to interpret multiple results in order to provide you with the best plan of care or course of treatment. Therefore, we ask that you please give [place] 2 business days to thoroughly review all your results before contacting the office for clarification. Should we see a critical lab result,  you will be contacted sooner.   If You Need Anything After Your Visit  If you have any questions or concerns for your doctor, please call our main line at [phone] If no one answers, please leave a voicemail as directed and we will return your call as soon as possible. Messages left after 4 pm will be answered the following business day.   You may also send [place] a message via MyChart. We typically respond to MyChart messages within 1-2 business days.  For prescription refills, please ask your pharmacy to contact our office. Our fax number is [phone].  If you have an urgent issue when the clinic is closed that cannot wait until the next business day, you can page your doctor at the number below.    Please note that while we do our best to be available for urgent issues outside of office hours, we are not available 24/7.   If you have an urgent issue and are unable to reach [place], you may choose to seek medical care at your doctor's office, retail clinic, urgent care center, or emergency room.  If you have a medical emergency, please immediately call 911 or go to the emergency department. In the event of inclement weather, please call our main line at [phone] for an update on the status of any delays or closures.  Dermatology Medication Tips: Please keep the boxes that topical medications come in in order to help keep track of the instructions about where and how to use these. Pharmacies typically print the medication instructions only on the boxes and not directly on the medication  tubes.   If your medication is too expensive, please contact our office at [phone] or send [place] a message through MyChart.   We are unable to tell what your co-pay for medications will be in advance as this is different depending on your insurance coverage. However, we may be able to find a substitute medication at lower cost or fill out paperwork to get insurance to cover a needed medication.   If a  prior authorization is required to get your medication covered by your insurance company, please allow [place] 1-2 business days to complete this process.  Drug prices often vary depending on where the prescription is filled and some pharmacies may offer cheaper prices.  The website www.goodrx.com contains coupons for medications through different pharmacies. The prices here do not account for what the cost may be with help from insurance (it may be cheaper with your insurance), but the website can give you the price if you did not use any insurance.  - You can print the associated coupon and take it with your prescription to the pharmacy.  - You may also stop by our office during regular business hours and pick up a GoodRx coupon card.  - If you need your prescription sent electronically to a different pharmacy, notify our office through [facility] or by phone at [phone]

## 2023-06-05 ENCOUNTER — Ambulatory Visit: Payer: Medicare Other | Admitting: Pharmacist

## 2023-06-15 IMAGING — DX DG HAND COMPLETE 3+V*L*
3 series · 3 of 3 positions shown · non-contrast
Comparison: None Available.

CLINICAL DATA: Swelling.

EXAM:
LEFT HAND - COMPLETE 3+ VIEW

[hand ap]
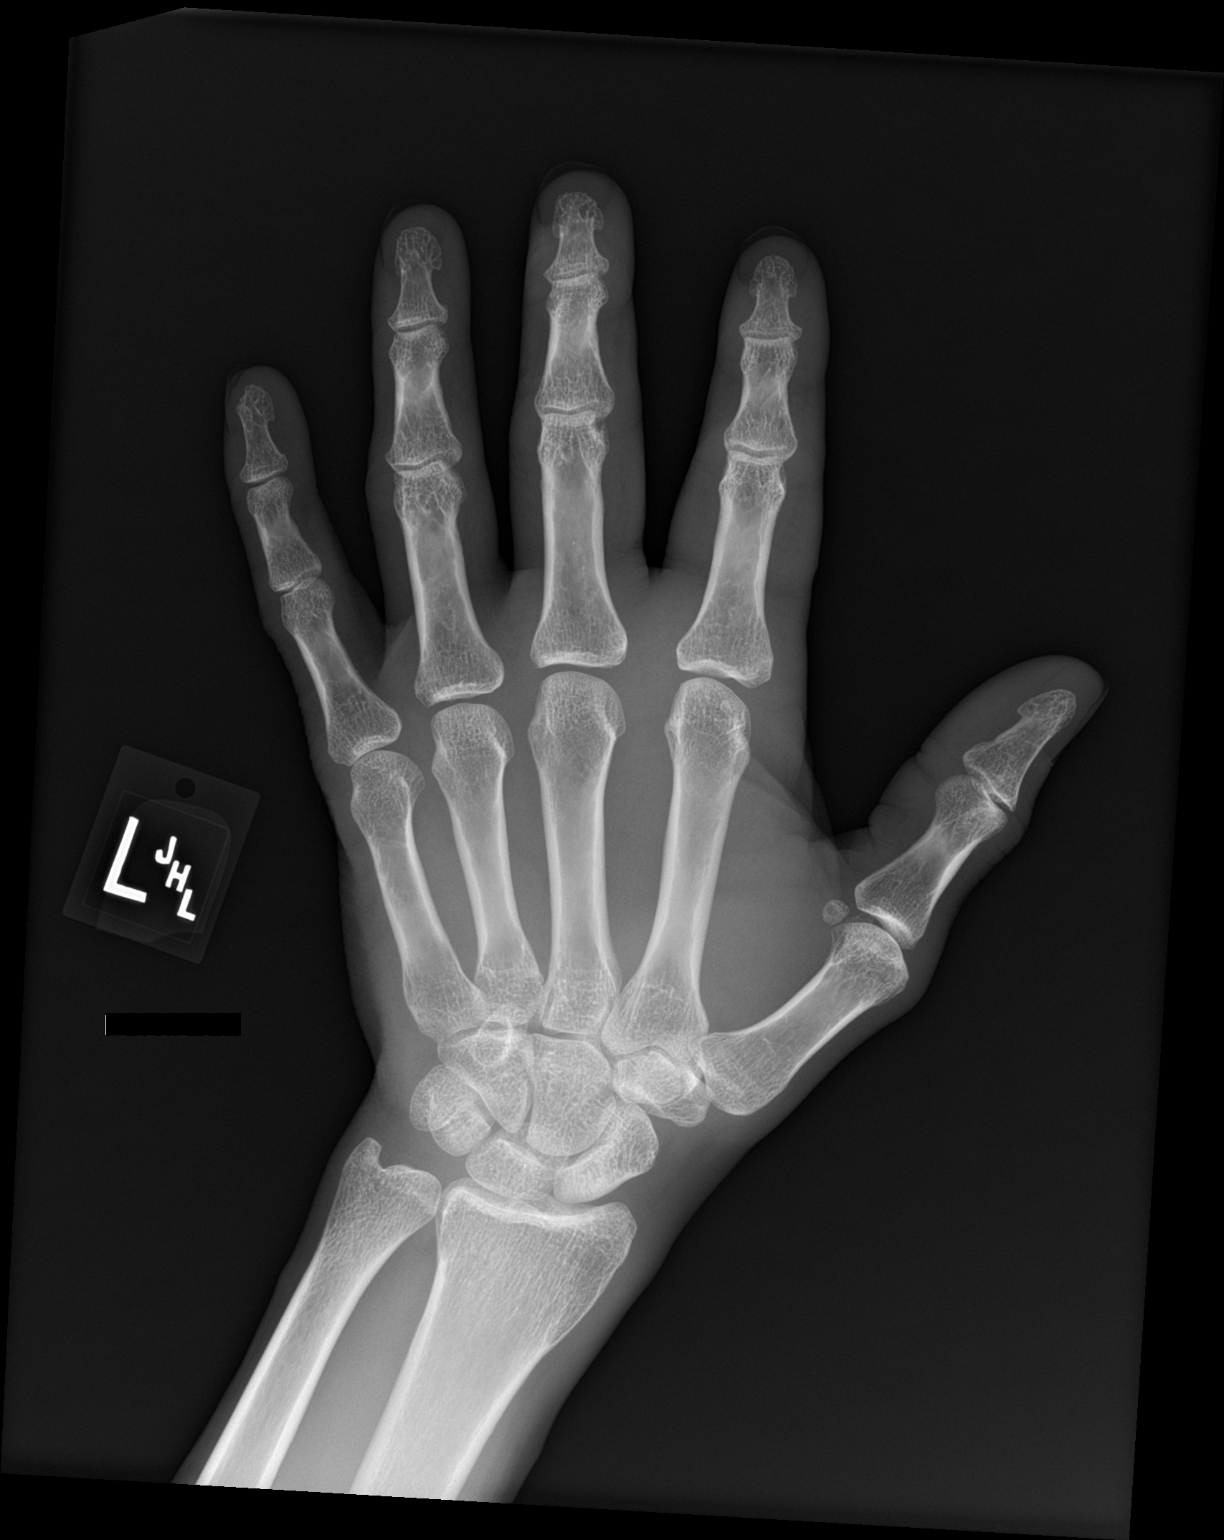

[hand obl]
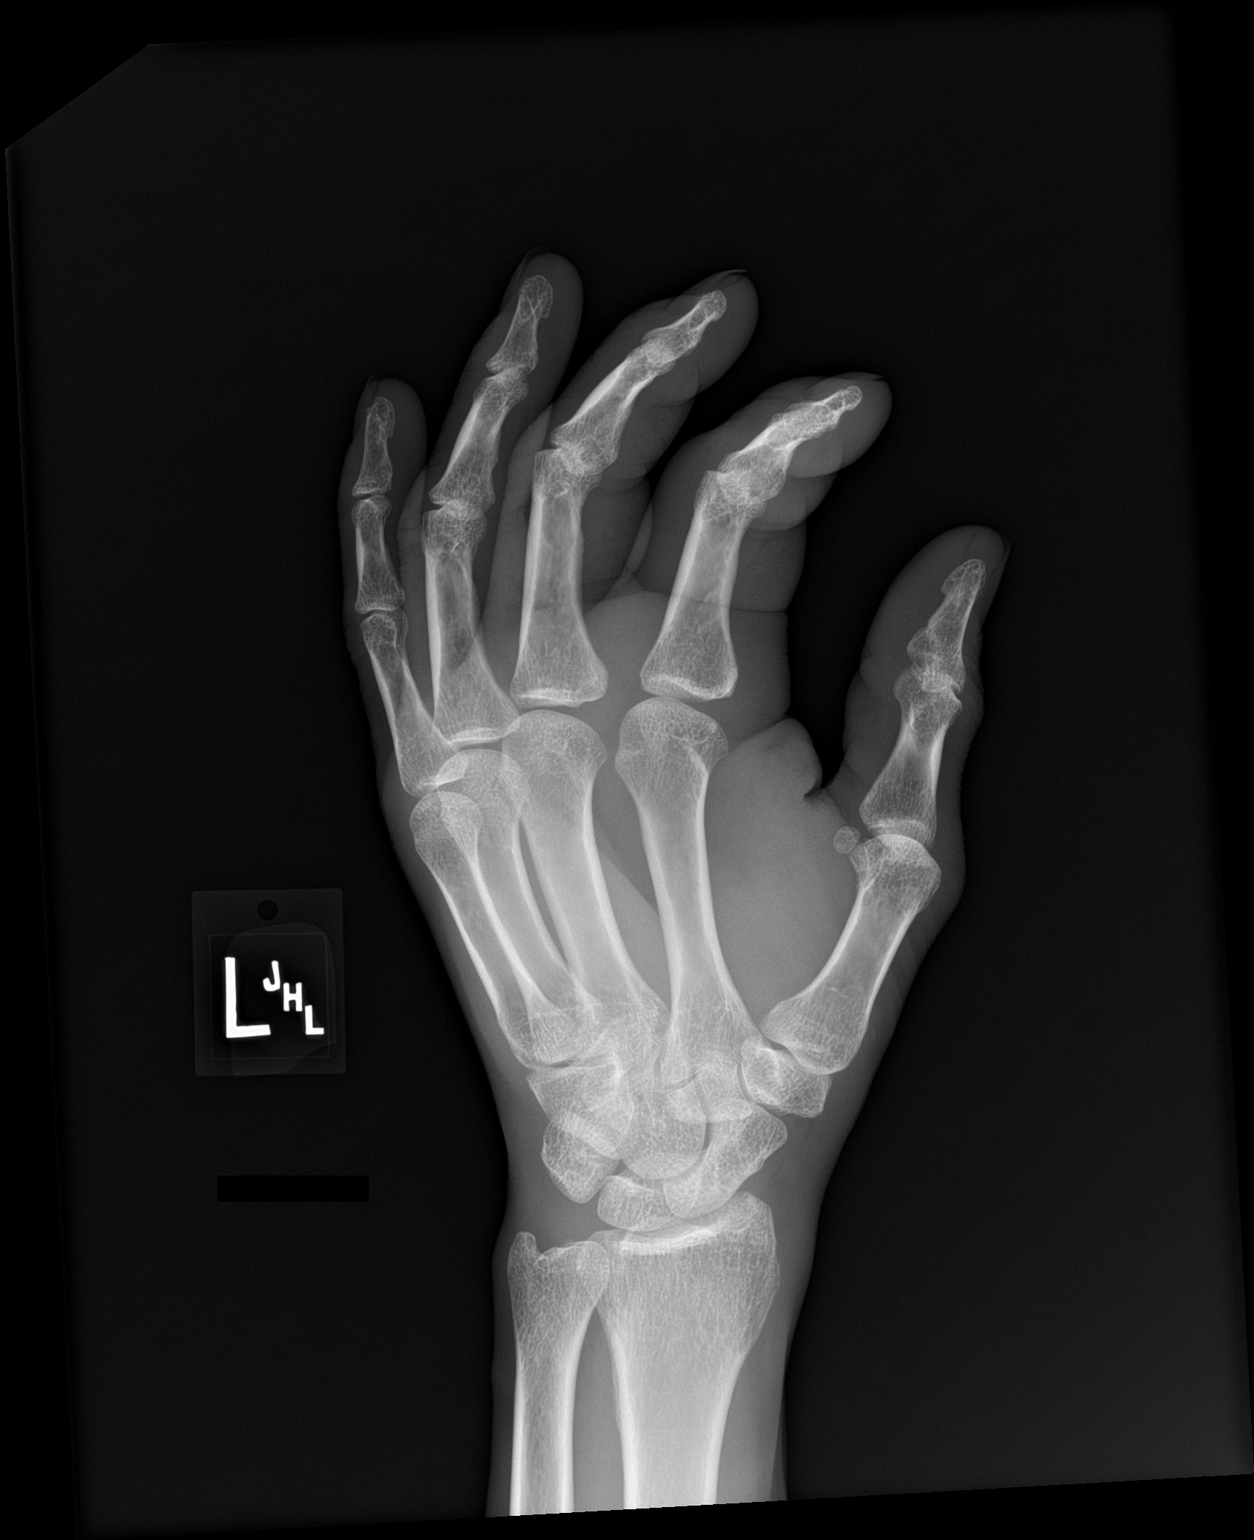

[hand lat]
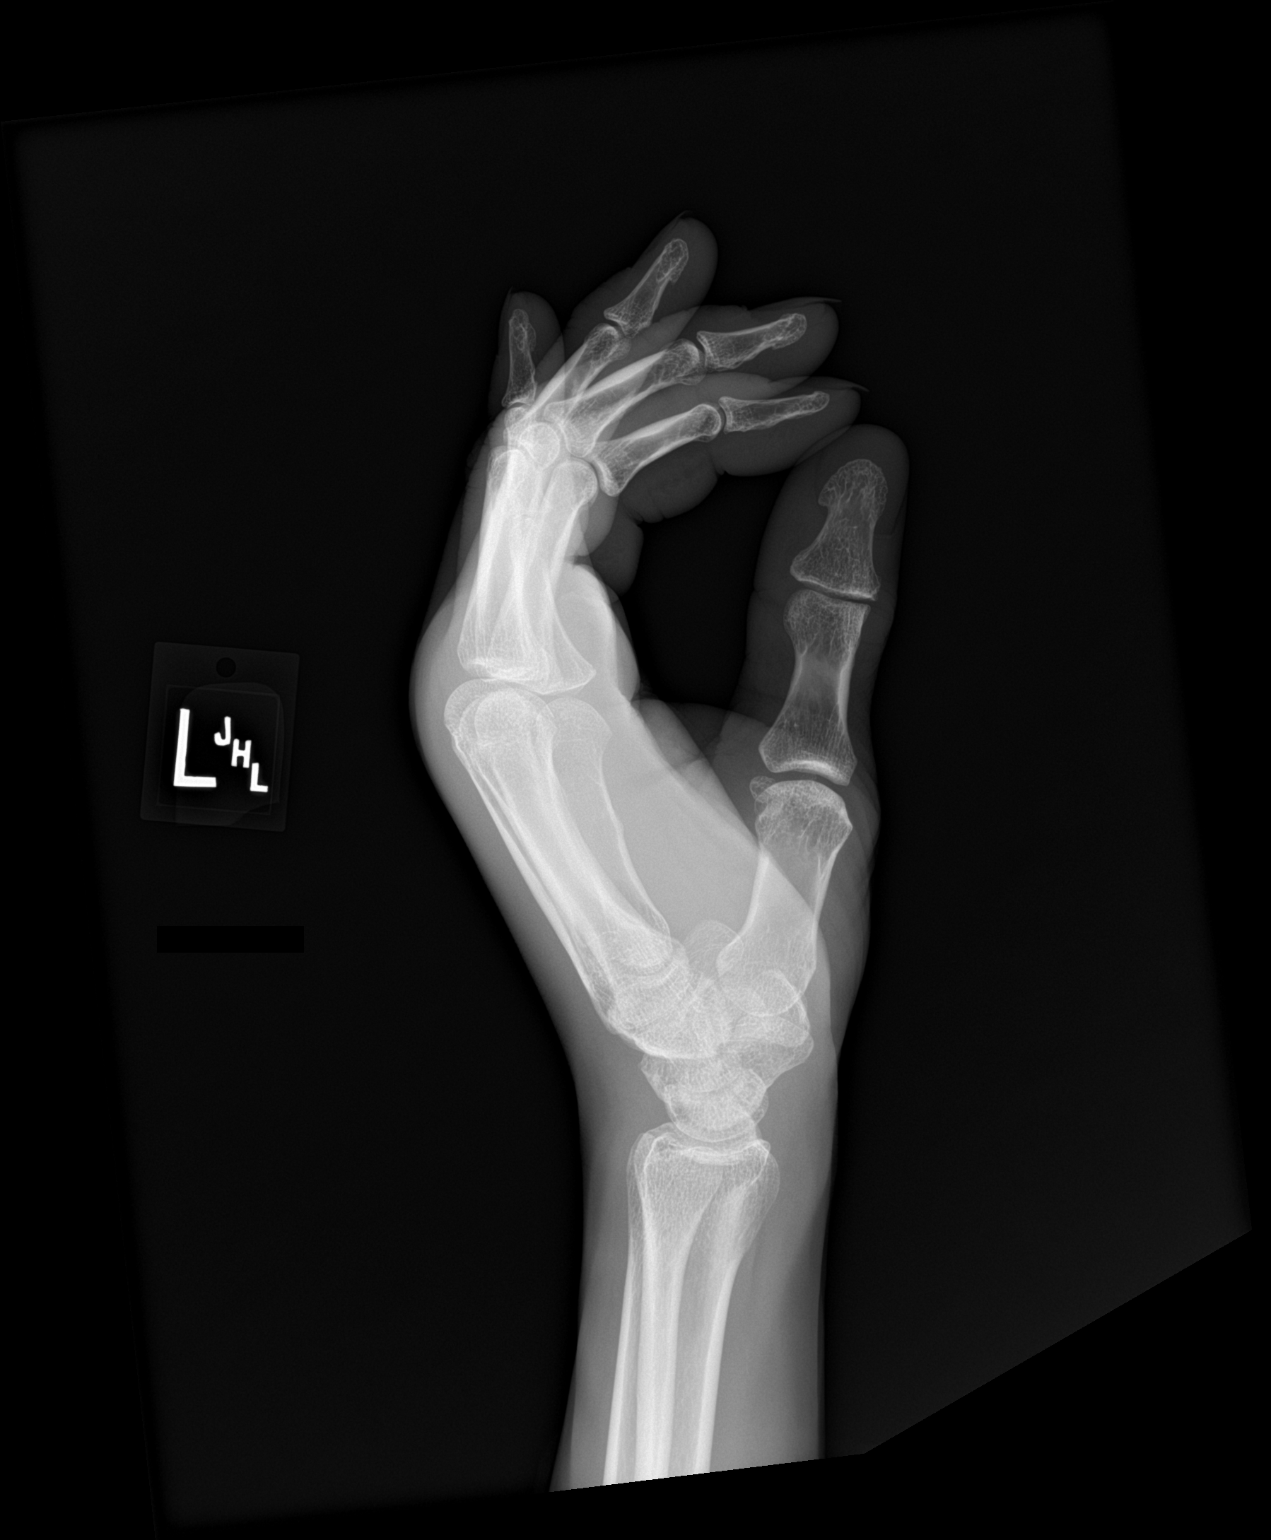

[3 of 3 positions shown; findings below may reference images not displayed]

FINDINGS: There is diffuse soft tissue swelling of the hand. There is no
evidence of fracture or dislocation. There is no evidence of
arthropathy or other focal bone abnormality.
IMPRESSION: Negative.

## 2023-06-16 IMAGING — DX DG CHEST 1V PORT
2 series · 2 of 2 positions shown · non-contrast
Comparison: None Available.

CLINICAL DATA: Fever

EXAM:
PORTABLE CHEST 1 VIEW

[chest ap (1 of 2)]
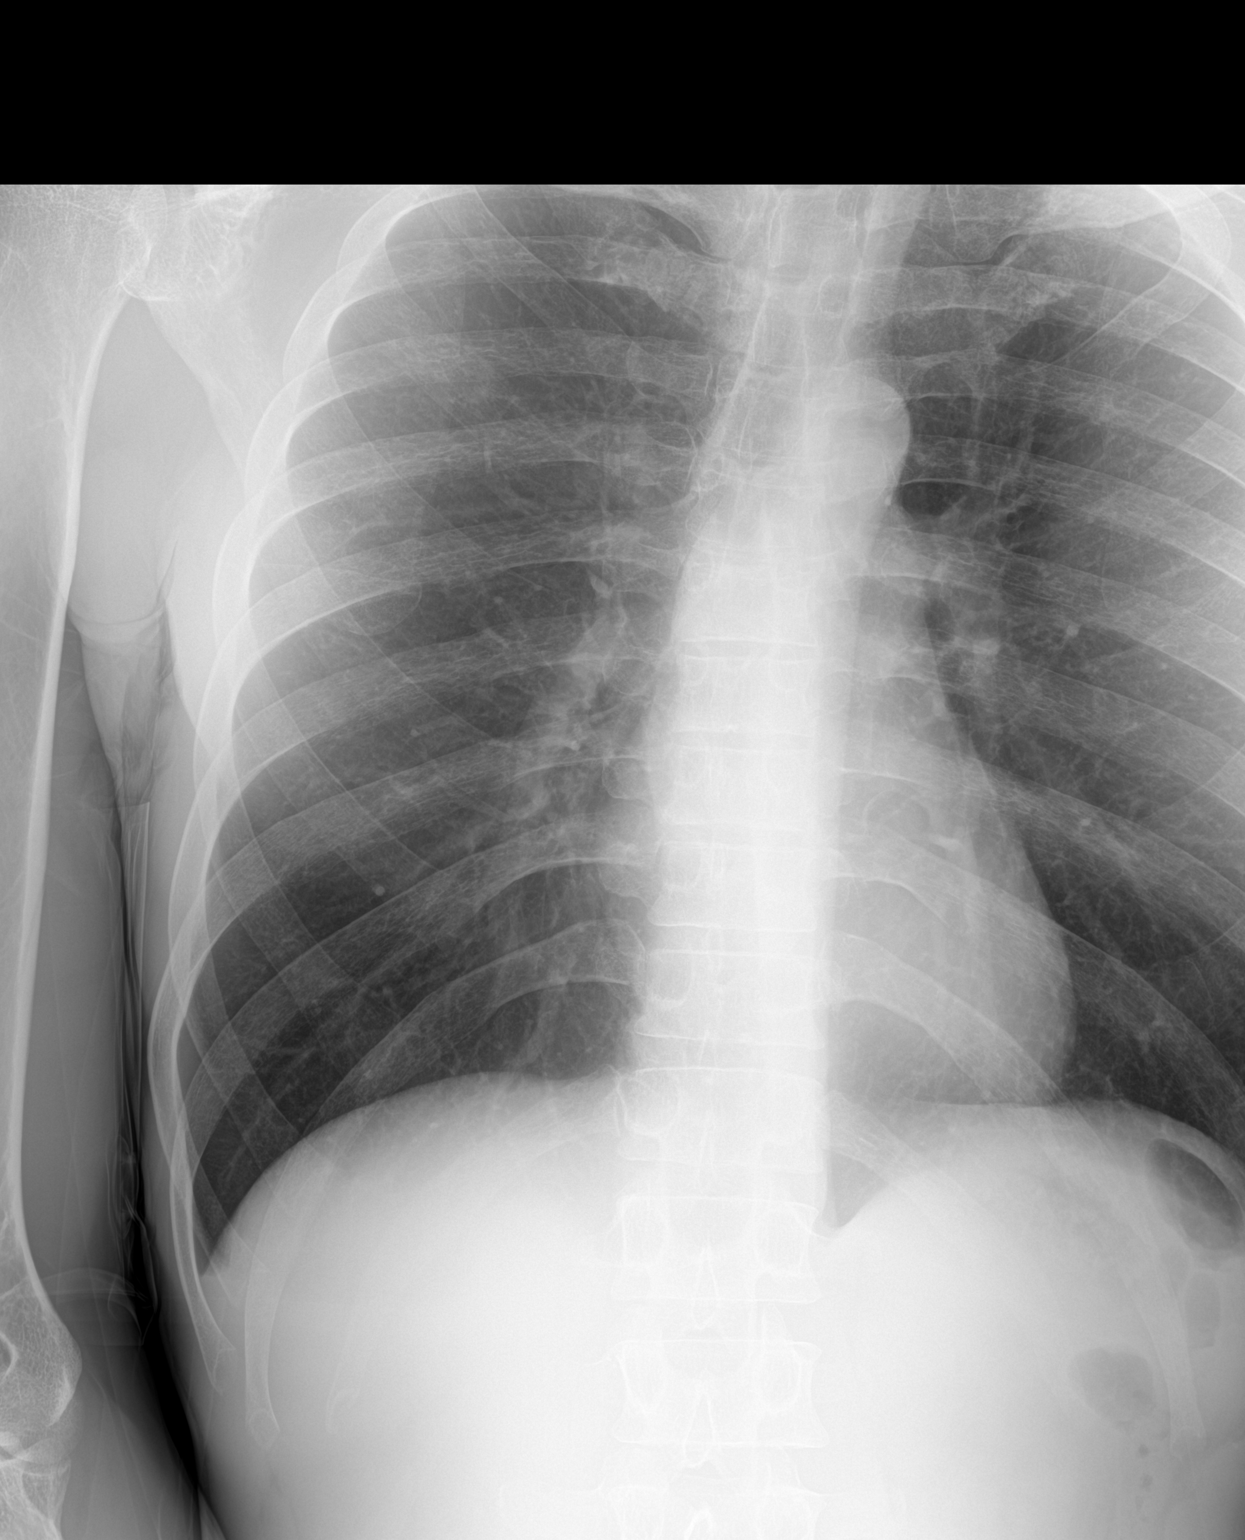

[chest ap (2 of 2)]
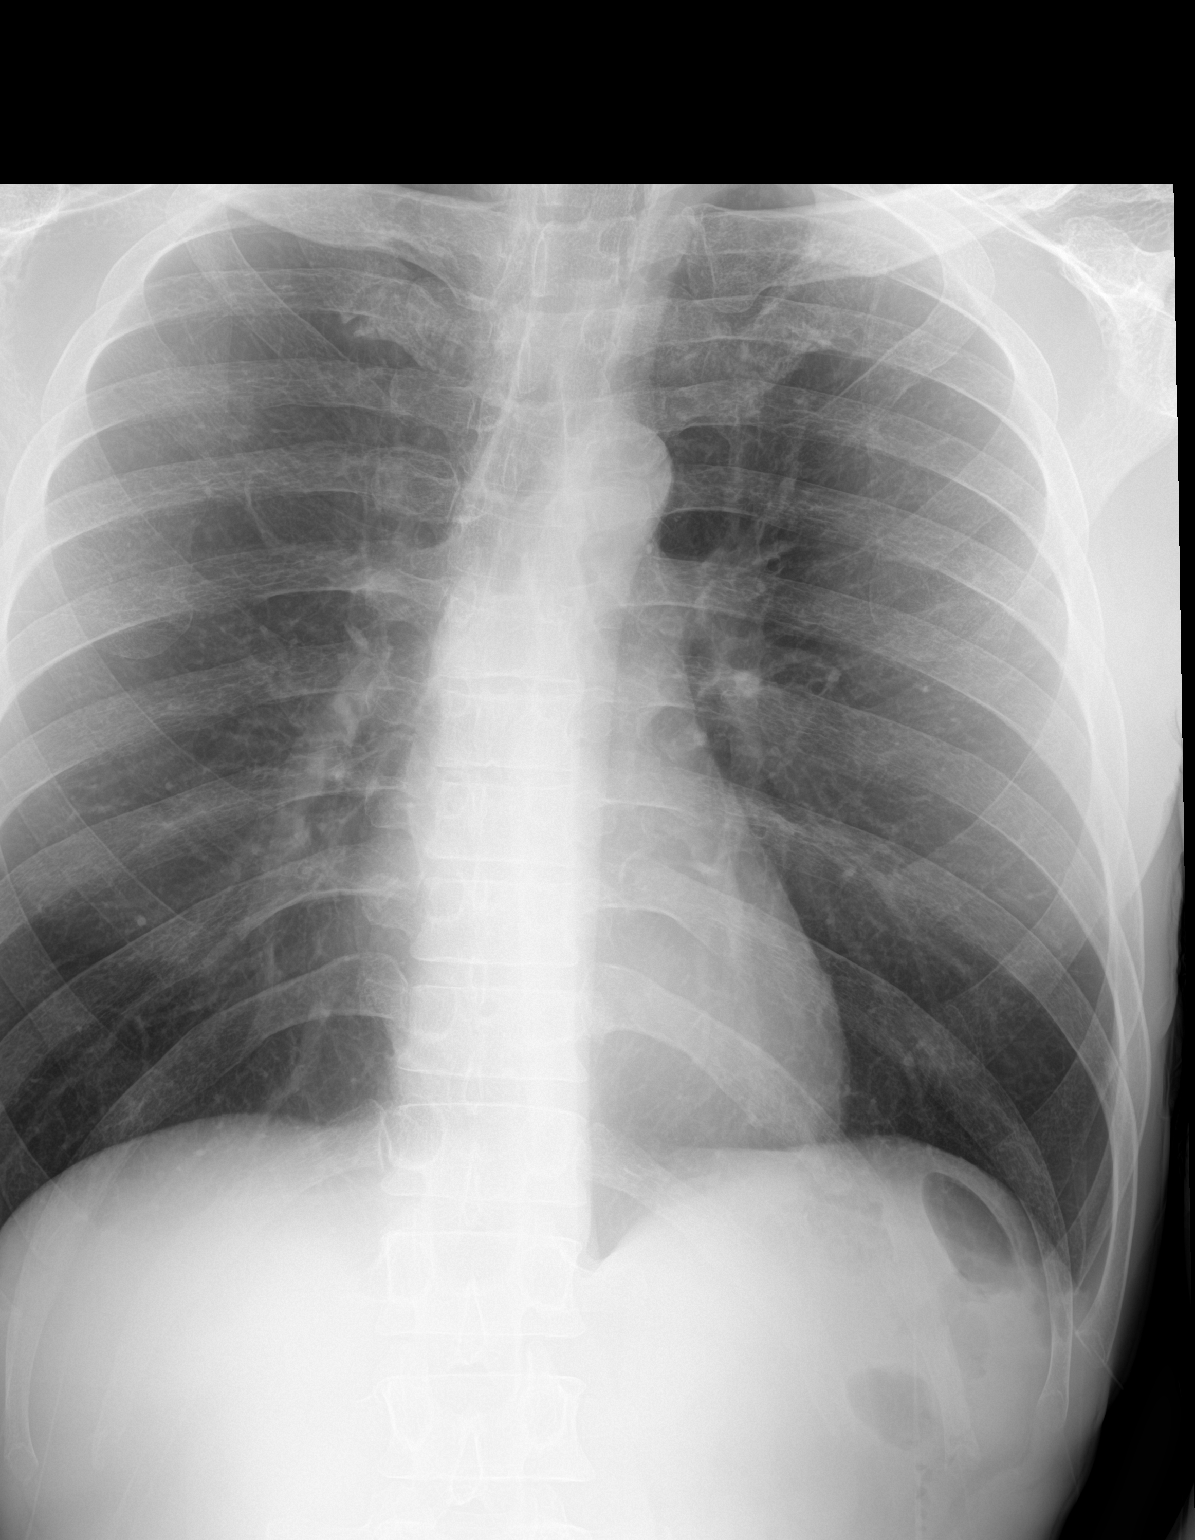

[2 of 2 positions shown; findings below may reference images not displayed]

FINDINGS: The heart size and mediastinal contours are within normal limits.
Both lungs are clear. The visualized skeletal structures are
unremarkable.
IMPRESSION: No active disease.

## 2023-06-22 ENCOUNTER — Other Ambulatory Visit: Payer: Self-pay

## 2023-06-22 ENCOUNTER — Encounter: Payer: Self-pay | Admitting: Pharmacist

## 2023-06-22 ENCOUNTER — Ambulatory Visit: Payer: Medicare Other | Attending: Family Medicine | Admitting: Pharmacist

## 2023-06-22 DIAGNOSIS — E10649 Type 1 diabetes mellitus with hypoglycemia without coma: Secondary | ICD-10-CM | POA: Insufficient documentation

## 2023-06-22 DIAGNOSIS — Z794 Long term (current) use of insulin: Secondary | ICD-10-CM

## 2023-06-22 DIAGNOSIS — E1065 Type 1 diabetes mellitus with hyperglycemia: Secondary | ICD-10-CM

## 2023-06-22 DIAGNOSIS — E109 Type 1 diabetes mellitus without complications: Secondary | ICD-10-CM | POA: Diagnosis present

## 2023-06-22 MED ORDER — FIASP FLEXTOUCH 100 UNIT/ML ~~LOC~~ SOPN: PEN_INJECTOR | SUBCUTANEOUS | 2 refills | Status: AC

## 2023-06-22 NOTE — Progress Notes (Unsigned)
S:    No chief complaint on file.  47 y.o. male who presents for diabetes evaluation, education, and management. PMH is significant for T1DM w/ hx of DKA, chronic diarrhea, NV, weight loss/protein-calorie malnutrition. Patient's chart reports diabetes was diagnosed in 2016.   Patient was last seen by Primary Care Provider, Dr. Alvis Lemmings, on 12/16/2022. He was referred 05/06/2023 by our Care Management team for assistance with blood sugar control. Last seen by pharmacist on 03/27/2023.    At last pharmacist visit, Basaglar increased from 10 to 12u daily in the morning and Fiasp SSI continued as prescribed by Dr. Katrinka Blazing at [facility] (BG<120 - no insulin, 120-160 - take 3 units, 161- 200 - take 4 units, 201-250 - take 5 units, 251-300 - take 5 units, 301- 350 - take 6 units, >351 - take 7 units). A1c of 9.8% from [facility] on 04/22/2023.   Patient arrives in okay spirits and presents without any assistance. Reports that his daughter has been sick and he thinks he caught a bug from her. Has had nausea for the past few days and endorsed some vomiting about 2 days ago. Denies diarrhea, fever, or chills. Decreased his Basaglar to 11 units as he was having overnight hypoglycemia and this has since resolved. Reports he does not use the sliding scale as prescribed by Dr. Katrinka Blazing d/t hypoglycemia. Instead will take 1 unit with smaller meals and 2-3 units with larger meals, but notes that even 3 units will cause lows. Appropriately treats low BG with juice which he has in the room with him today.   Family/Social History:  -Fhx: HTN, Lupus, DM, stroke -Tobacco: former smoker (quit in 2020) -Alcohol: none reported   Current diabetes medications include: Basaglar to 12u daily in the morning (taking 11 units daily), SSI Fiasp before meals (taking 1 unit with smaller meals and 2 units with larger meals) Current cholesterol medications include: atorvastatin 20 mg daily  Patient reports not taking medications as  prescribed due to hypoglycemia.   Insurance coverage: Medicare   Patient denies nocturia (nighttime urination).  Patient denies neuropathy (nerve pain). Patient denies visual changes. Patient reports self foot exams.   Patient reported dietary habits: Eats 2 meals/day -usually skips breakfast -Lunch: baked chicken, fish, sandwich, vegetables -Dinner: baked chicken, fish, sandwich, vegetables -Drinks 3 bottles of water in a day, soda or juice to treat low BG, coffee with splenda and creamer  Patient-reported exercise habits: none  O:  Dexcom G7 CGM Download today (06/22/2023) - 14-day summary Average Glucose: 267 mg/dL Glucose Management Indicator: 9.7% Time in Goal:  - Time in range 70-180: 22%  - Time high: 24% -Time >250: 52% - Time below range: 2% Observed patterns: reports BG 200s-300s over the past few days due to illness    Lab Results  Component Value Date   HGBA1C 11.3 (A) 12/16/2022   There were no vitals filed for this visit.  Lipid Panel     Component Value Date/Time   CHOL 107 12/19/2021 1215   TRIG 37 12/19/2021 1215   HDL 38 (L) 12/19/2021 1215   CHOLHDL 2.9 12/18/2020 1104   CHOLHDL 2.3 12/19/2015 0947   VLDL 12 12/19/2015 0947   LDLCALC 59 12/19/2021 1215    Clinical Atherosclerotic Cardiovascular Disease (ASCVD): No  The ASCVD Risk score (Arnett DK, et al., 2019) failed to calculate for the following reasons:   The valid total cholesterol range is 130 to 320 mg/dL   Patient is participating in a Managed  Medicaid Plan: No   A/P: Diabetes longstanding, currently uncontrolled. Patient is able to verbalize appropriate hypoglycemia management plan. Medication adherence appears okay. Control is suboptimal due to lack of optimized regimen. Patient with labile blood sugars- previous daily hypoglycemia, but now has had a few days of hyperglycemia likely due to current illness. Suspect with resolution in illness, blood sugar will come back down. Will alter  his SSI to increase threshold at which he injects insulin to prevent hypoglycemia. Will also continue Basaglar at the reduced dose since he is tolerating this better. Instructed patient to call clinic if he experiences further hypoglycemia or sustained hyperglycemia. -Decrease Basaglar to 11 units daily as he has already been taking this dose -Decrease Fiasp SSI to <150 no insulin, 151-200 take 2 units, 201-250 take 3 units, 251-300 take 4 units, 301-350 take 5 units, 351-400 take 6 units, >400 call endocrine -Extensively discussed pathophysiology of diabetes, recommended lifestyle interventions, dietary effects on blood sugar control.  -Counseled on s/sx of and management of hypoglycemia.  -Next A1c anticipated 07/2023.   ASCVD risk - secondary prevention in patient with diabetes. Last LDL is 59 at goal of <70 mg/dL. ASCVD risk factors include DM. Moderate intensity statin indicated.  -Continued atorvastatin 20 mg   Written patient instructions provided. Patient verbalized understanding of treatment plan.  Total time in face to face counseling 30 minutes.    Follow-up:  Pharmacist in 3-4 weeks PCP visit 05/04/24  Jarrett Ables, PharmD PGY-1 Pharmacy Resident

## 2023-06-28 ENCOUNTER — Other Ambulatory Visit: Payer: Self-pay

## 2023-06-28 ENCOUNTER — Emergency Department (HOSPITAL_COMMUNITY)
Admission: EM | Admit: 2023-06-28 | Discharge: 2023-06-29 | Disposition: A | Discharge status: Home / Self Care | Payer: Medicare Other | Attending: Emergency Medicine | Admitting: Emergency Medicine

## 2023-06-28 ENCOUNTER — Emergency Department (HOSPITAL_COMMUNITY): Payer: Medicare Other

## 2023-06-28 ENCOUNTER — Encounter (HOSPITAL_COMMUNITY): Payer: Self-pay | Admitting: *Deleted

## 2023-06-28 DIAGNOSIS — I959 Hypotension, unspecified: Secondary | ICD-10-CM | POA: Diagnosis not present

## 2023-06-28 DIAGNOSIS — E109 Type 1 diabetes mellitus without complications: Secondary | ICD-10-CM | POA: Diagnosis not present

## 2023-06-28 DIAGNOSIS — Z794 Long term (current) use of insulin: Secondary | ICD-10-CM | POA: Insufficient documentation

## 2023-06-28 DIAGNOSIS — L03116 Cellulitis of left lower limb: Secondary | ICD-10-CM | POA: Insufficient documentation

## 2023-06-28 DIAGNOSIS — M25462 Effusion, left knee: Secondary | ICD-10-CM | POA: Diagnosis not present

## 2023-06-28 DIAGNOSIS — M879 Osteonecrosis, unspecified: Secondary | ICD-10-CM | POA: Diagnosis not present

## 2023-06-28 DIAGNOSIS — S81002A Unspecified open wound, left knee, initial encounter: Secondary | ICD-10-CM | POA: Diagnosis not present

## 2023-06-28 DIAGNOSIS — R609 Edema, unspecified: Secondary | ICD-10-CM | POA: Diagnosis not present

## 2023-06-28 DIAGNOSIS — S42002A Fracture of unspecified part of left clavicle, initial encounter for closed fracture: Secondary | ICD-10-CM | POA: Diagnosis not present

## 2023-06-28 DIAGNOSIS — R509 Fever, unspecified: Secondary | ICD-10-CM | POA: Diagnosis not present

## 2023-06-28 DIAGNOSIS — R0689 Other abnormalities of breathing: Secondary | ICD-10-CM | POA: Diagnosis not present

## 2023-06-28 DIAGNOSIS — R000 Tachycardia, unspecified: Secondary | ICD-10-CM | POA: Diagnosis not present

## 2023-06-28 DIAGNOSIS — M25562 Pain in left knee: Secondary | ICD-10-CM | POA: Diagnosis present

## 2023-06-28 LAB — CBC WITH DIFFERENTIAL/PLATELET
Abs Immature Granulocytes: 0.06 10*3/uL (ref 0.00–0.07)
Basophils Absolute: 0.1 10*3/uL (ref 0.0–0.1)
Basophils Relative: 0 %
Eosinophils Absolute: 0 10*3/uL (ref 0.0–0.5)
Eosinophils Relative: 0 %
HCT: 29.8 % — ABNORMAL LOW (ref 39.0–52.0)
Hemoglobin: 9.9 g/dL — ABNORMAL LOW (ref 13.0–17.0)
Immature Granulocytes: 0 %
Lymphocytes Relative: 9 %
Lymphs Abs: 1.4 10*3/uL (ref 0.7–4.0)
MCH: 28.2 pg (ref 26.0–34.0)
MCHC: 33.2 g/dL (ref 30.0–36.0)
MCV: 84.9 fL (ref 80.0–100.0)
Monocytes Absolute: 1.1 10*3/uL — ABNORMAL HIGH (ref 0.1–1.0)
Monocytes Relative: 7 %
Neutro Abs: 12.9 10*3/uL — ABNORMAL HIGH (ref 1.7–7.7)
Neutrophils Relative %: 84 %
Platelets: 315 10*3/uL (ref 150–400)
RBC: 3.51 MIL/uL — ABNORMAL LOW (ref 4.22–5.81)
RDW: 15.1 % (ref 11.5–15.5)
WBC: 15.6 10*3/uL — ABNORMAL HIGH (ref 4.0–10.5)
nRBC: 0 % (ref 0.0–0.2)

## 2023-06-28 LAB — COMPREHENSIVE METABOLIC PANEL
ALT: 13 U/L (ref 0–44)
AST: 19 U/L (ref 15–41)
Albumin: 2.9 g/dL — ABNORMAL LOW (ref 3.5–5.0)
Alkaline Phosphatase: 87 U/L (ref 38–126)
Anion gap: 12 (ref 5–15)
BUN: 11 mg/dL (ref 6–20)
CO2: 21 mmol/L — ABNORMAL LOW (ref 22–32)
Calcium: 8.6 mg/dL — ABNORMAL LOW (ref 8.9–10.3)
Chloride: 102 mmol/L (ref 98–111)
Creatinine, Ser: 1.07 mg/dL (ref 0.61–1.24)
GFR, Estimated: >60 mL/min (ref >60)
Glucose, Bld: 100 mg/dL — ABNORMAL HIGH (ref 70–99)
Potassium: 4 mmol/L (ref 3.5–5.1)
Sodium: 135 mmol/L (ref 135–145)
Total Bilirubin: 1.7 mg/dL — ABNORMAL HIGH (ref <1.2)
Total Protein: 6.1 g/dL — ABNORMAL LOW (ref 6.5–8.1)

## 2023-06-28 LAB — CBG MONITORING, ED: Glucose-Capillary: 102 mg/dL — ABNORMAL HIGH (ref 70–99)

## 2023-06-28 LAB — PROTIME-INR
INR: 1.1 (ref 0.8–1.2)
Prothrombin Time: 14.8 s (ref 11.4–15.2)

## 2023-06-28 LAB — I-STAT CG4 LACTIC ACID, ED: Lactic Acid, Venous: 1.2 mmol/L (ref 0.5–1.9)

## 2023-06-28 MED ORDER — DOXYCYCLINE HYCLATE 100 MG IV SOLR
100.0000 mg | Freq: Once | INTRAVENOUS | Status: AC
Start: 2023-06-28 — End: 2023-06-29
  Administered 2023-06-29: 100 mg via INTRAVENOUS
  Filled 2023-06-28: qty 100

## 2023-06-28 MED ORDER — DOXYCYCLINE HYCLATE 100 MG PO CAPS: 100.0000 mg | ORAL_CAPSULE | Freq: Two times a day (BID) | ORAL | 0 refills | Status: AC

## 2023-06-28 MED ORDER — LIDOCAINE-EPINEPHRINE (PF) 2 %-1:200000 IJ SOLN
10.0000 mL | Freq: Once | INTRAMUSCULAR | Status: AC
  Administered 2023-06-28: 10 mL
  Filled 2023-06-28: qty 20

## 2023-06-28 MED ORDER — CEPHALEXIN 500 MG PO CAPS: 500.0000 mg | ORAL_CAPSULE | Freq: Four times a day (QID) | ORAL | 0 refills | Status: AC

## 2023-06-28 MED ORDER — SODIUM CHLORIDE 0.9 % IV SOLN
1.0000 g | Freq: Once | INTRAVENOUS | Status: AC
  Administered 2023-06-28: 1 g via INTRAVENOUS
  Filled 2023-06-28: qty 10

## 2023-06-28 MED ORDER — HYDROCODONE-ACETAMINOPHEN 5-325 MG PO TABS: 1.0000 | ORAL_TABLET | Freq: Four times a day (QID) | ORAL | 0 refills | Status: AC | PRN

## 2023-06-28 NOTE — ED Provider Notes (Signed)
[place] EMERGENCY DEPARTMENT AT [facility] Provider Note   CSN: [id number] Arrival date & time: 06/28/23  2016     History  Chief Complaint  Patient presents with   Wound Infection   Fever    Jared Murray is a 47 y.o. male history of type 1 diabetes, DKA, discoid lupus limited to skin presented with fever and left knee pain and ulcer.  Patient states he woke up Friday morning with what appeared to be a carpet burn on his left knee that has progressively gotten worse.  Patient states that hurts to bear weight and that his knee is swollen and red.  Patient reports of having a fever by EMS of 101 F given Tylenol.  Patient denies chest pain, shortness of breath, abdominal pain, nausea/vomiting, headache  Home Medications Prior to Admission medications   Medication Sig Start Date End Date Taking? Authorizing Provider  cephALEXin (KEFLEX) 500 MG capsule Take 1 capsule (500 mg total) by mouth 4 (four) times daily for 5 days. 06/28/23 07/03/23 Yes Kathern Lobosco, Beverly Gust, PA-C  doxycycline (VIBRAMYCIN) 100 MG capsule Take 1 capsule (100 mg total) by mouth 2 (two) times daily for 7 days. 06/28/23 07/05/23 Yes Naliah Eddington, Beverly Gust, PA-C  HYDROcodone-acetaminophen (NORCO) 5-325 MG tablet Take 1 tablet by mouth every 6 (six) hours as needed for up to 5 days for moderate pain (pain score 4-6). 06/28/23 07/03/23 Yes Donnarae Rae, Beverly Gust, PA-C  Accu-Chek Softclix Lancets lancets Use to check blood sugar three times daily. E10.65 03/19/22   Hoy Register, MD  aspirin-sod bicarb-citric acid (ALKA-SELTZER) 325 MG TBEF tablet Take 325 mg by mouth every 6 (six) hours as needed (heartburn).    [provider]  atorvastatin (LIPITOR) 20 MG tablet TAKE 1 TABLET (20 MG TOTAL) BY MOUTH DAILY. Patient taking differently: Take 20 mg by mouth daily. 10/10/21 10/19/22  Anders Simmonds, PA-C  betamethasone dipropionate (DIPROLENE) 0.05 % ointment Apply to scalp, hands and feet twice a day for 2 weeks  then stop 05/18/23   Terri Piedra, DO  Blood Glucose Monitoring Suppl (ACCU-CHEK GUIDE) w/Device KIT Use to check blood sugar 3 times daily. E10.65 03/19/22   Hoy Register, MD  clobetasol ointment (TEMOVATE) 0.05 % Apply 1 Application topically as directed. Bid to aa scalp, hands and feet for 2 weeks at a time, alternating with Tacrolimus ointment, avoid using on the face, axilla, groin 05/18/23   Terri Piedra, DO  clotrimazole-betamethasone (LOTRISONE) cream Apply 1 Application topically daily. 03/30/23   Helane Gunther, DPM  Continuous Glucose Receiver (DEXCOM G7 RECEIVER) DEVI Use to check blood sugar up to 4 times daily. E10.65 12/24/22   Hoy Register, MD  Continuous Glucose Sensor (DEXCOM G7 SENSOR) MISC Use to check blood sugar up to 4 times daily. Change sensors once every 10 days. E10.65 12/24/22   Hoy Register, MD  esomeprazole (NEXIUM) 40 MG capsule Take 1 capsule (40 mg total) by mouth 2 (two) times daily before a meal. 03/27/23   Hoy Register, MD  famotidine (PEPCID) 20 MG tablet Take 1 tablet (20 mg total) by mouth 2 (two) times daily. 03/27/23   Hoy Register, MD  glucose blood (ACCU-CHEK GUIDE) test strip Use to check blood sugar three times daily. E10.65 03/19/22   Hoy Register, MD  insulin aspart (FIASP FLEXTOUCH) 100 UNIT/ML FlexTouch Pen <150, do not take insulin. 151-200, take 2 units. 201-250, take 3 units. 251-300, take 4 units. 301-350, take 5 units. 351-400, take 6 units. Above  400, call Endocrine. 06/22/23   Hoy Register, MD  Insulin Glargine (BASAGLAR KWIKPEN) 100 UNIT/ML Inject 12 Units into the skin at bedtime. 05/06/23   Hoy Register, MD  INSULIN SYRINGE 1CC/29G 29G X 1/2" 1 ML MISC use as directed Patient taking differently: 1 each by Other route See admin instructions. use as directed 02/15/21   Hoy Register, MD  sildenafil (VIAGRA) 50 MG tablet Take 1 tablet (50 mg total) by mouth daily as needed for erectile dysfunction (At least 24 hours between doses).  12/19/21   Hoy Register, MD  tacrolimus (PROTOPIC) 0.1 % ointment Apply topically as directed. Bid to aa scalp, hands, feet for 2 weeks, alternating with Clobetasol ointment 05/18/23   Terri Piedra, DO  Tetrahydrozoline HCl (VISINE OP) Place 1 drop into both eyes daily as needed (dryness / allergies).    [provider]      Allergies    Bee venom, Latex, and Penicillins    Review of Systems   Review of Systems  Constitutional:  Positive for fever.    Physical Exam Updated Vital Signs BP (!) 120/94 (BP Location: Right Arm)   Pulse 98   Temp 98.8 F (37.1 C) (Oral)   Resp 16   Ht 5\' 10"  (1.778 m)   Wt 54.4 kg   SpO2 100%   BMI 17.22 kg/m  Physical Exam Constitutional:      General: He is not in acute distress. Cardiovascular:     Rate and Rhythm: Normal rate and regular rhythm.     Pulses: Normal pulses.     Heart sounds: Normal heart sounds.  Pulmonary:     Effort: Pulmonary effort is normal. No respiratory distress.     Breath sounds: Normal breath sounds.  Musculoskeletal:     Comments: Does not want to bear weight on left leg due to knee pain however does ambulate with walker at baseline Left knee appears edematous, erythematous and warm to the touch 2 cm ulcer noted to the lateral aspect of patient's left knee but does not show any signs of purulent material and is superficial in nature No calf tenderness or leg edema noted 5 out of 5 left-sided ankle flexion/dorsiflexion, able to wiggle toes, knee flexion/extension Soft compartments Pain not out of proportion  Skin:    General: Skin is warm and dry.     Capillary Refill: Capillary refill takes less than 2 seconds.  Neurological:     Mental Status: He is alert.     Comments: Sensation intact distally  Psychiatric:        Mood and Affect: Mood normal.     ED Results / Procedures / Treatments   Labs (all labs ordered are listed, but only abnormal results are displayed) Labs Reviewed   COMPREHENSIVE METABOLIC PANEL - Abnormal; Notable for the following components:      Result Value   CO2 21 (*)    Glucose, Bld 100 (*)    Calcium 8.6 (*)    Total Protein 6.1 (*)    Albumin 2.9 (*)    Total Bilirubin 1.7 (*)    All other components within normal limits  CBC WITH DIFFERENTIAL/PLATELET - Abnormal; Notable for the following components:   WBC 15.6 (*)    RBC 3.51 (*)    Hemoglobin 9.9 (*)    HCT 29.8 (*)    Neutro Abs 12.9 (*)    Monocytes Absolute 1.1 (*)    All other components within normal limits  CBG MONITORING, ED -  Abnormal; Notable for the following components:   Glucose-Capillary 102 (*)    All other components within normal limits  CULTURE, BLOOD (ROUTINE X 2)  CULTURE, BLOOD (ROUTINE X 2)  PROTIME-INR  URINALYSIS, W/ REFLEX TO CULTURE (INFECTION SUSPECTED)  I-STAT CG4 LACTIC ACID, ED    EKG None  Radiology DG Knee Complete 4 Views Left  Result Date: 06/28/2023 CLINICAL DATA:  Possible infection. Wound to left knee for 2 weeks. Increased swelling and redness. Low blood pressure. EXAM: LEFT KNEE - COMPLETE 4+ VIEW COMPARISON:  None Available. FINDINGS: Soft tissue irregularity about the lateral knee with adjacent swelling. No acute fracture or dislocation. Small knee joint effusion. Medullary lesions in the medial femoral condyle and medial tibial plateau with central lucency and rim of sclerosis are favored to represent bone infarcts. IMPRESSION: 1. Soft tissue irregularity about the lateral knee with adjacent swelling. No acute fracture or dislocation. 2. Small knee joint effusion. 3. Presumed bone infarcts in the medial femoral condyle and medial tibial plateau. Electronically Signed   By: Minerva Fester M.D.   On: 06/28/2023 21:35   DG Chest 2 View  Result Date: 06/28/2023 CLINICAL DATA:  Possible infection.  Low blood pressure. EXAM: CHEST - 2 VIEW COMPARISON:  None Available. FINDINGS: Stable cardiomediastinal silhouette. No focal consolidation,  pleural effusion, or pneumothorax. No displaced rib fractures. Remote left clavicle fracture. IMPRESSION: No active cardiopulmonary disease. Electronically Signed   By: Minerva Fester M.D.   On: 06/28/2023 21:31    Procedures Procedures    Medications Ordered in ED Medications  cefTRIAXone (ROCEPHIN) 1 g in sodium chloride 0.9 % 100 mL IVPB (has no administration in time range)  doxycycline (VIBRAMYCIN) 100 mg in dextrose 5 % 250 mL IVPB (has no administration in time range)  lidocaine-EPINEPHrine (XYLOCAINE W/EPI) 2 %-1:200000 (PF) injection 10 mL (10 mLs Other Given 06/28/23 2219)    ED Course/ Medical Decision Making/ A&P                                 Medical Decision Making Amount and/or Complexity of Data Reviewed Labs: ordered. Radiology: ordered.   Zhi Lyman Speller 46 y.o. presented today for left knee pain. Working DDx that I considered at this time includes, but not limited to, contusion, strain/sprain, fracture, dislocation, neurovascular compromise, septic joint, ischemic limb, compartment syndrome.  R/o DDx: contusion, strain/sprain, fracture, dislocation, neurovascular compromise, septic joint, ischemic limb, compartment syndrome: These are considered less likely due to history of present illness, physical exam, labs/imaging findings.  Review of prior external notes: 06/01/2023 office visit  Unique Tests and My Interpretation:  CBC: Leukocytosis 15.6 CMP: Unremarkable PT/INR: Unremarkable Lactic acid: 1.2 Left knee x-ray: Bone infarcts noted on medial femoral condyle Chest x-ray: No acute pathology Blood cultures: Pending  Social Determinants of Health: none  Discussion with Independent Historian: None  Discussion of Management of Tests:  Victorino Dike, MD Orthopedics  Risk: Medium: prescription drug management  Risk Stratification Score: none  Staffed with Trifan, MD  Plan: On exam patient was in no acute distress stable vitals.  On exam patient does  have erythematous warm left knee with an ulcer noted. Erythema was noted to lateral aspect of knee. Concerned that patient may have cellulitis however also concerned that given patient's fever with EMS patient could have septic joint as he does not want to bear weight and he does have a leukocytosis as well.  Patient was able  to range his left knee fully however and had 5 out of 5 strength with this which is reassuring however due to concern for septic joint from fever, warm knee, erythema patient will need knee aspiration and so we will approach in the medial side as there is no cellulitis in that area.  I spoke to the patient about the possibility by doing an arthrocentesis and explained the risks and benefit of this procedure including being able to get fluid to further analyze rule out septic arthritis along with risks including infection, hitting a blood vessel or ligament, pain, etc. and patient with full sedating capacity stated that he understood and accepted these risks and wants to proceed.  This conversation was witnessed by Arlyce Harman, RN. Arthrocentesis was attempted however was unable to obtain any fluid.  At this time we will start IV antibiotics and consult orthopedics as patient does have bone infarcts noted on the x-ray.  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  They state that patient does get admitted that they will be available for consult and can see him in the morning.  I spoke to the patient he does not feel hospitalization is appropriate at this time and thinks he can be discharged.  Will give 1 dose of IV meds here and prescribe the rest and have him follow-up with orthopedics and encouraged him to continue monitoring his wound is most likely has a superficial ulcer with surrounding cellulitis.  Patient's pain has been  controlled throughout ED stay as he has not required any pain meds and has declined any pain meds at this time.  Attending evaluated patient as well and we agree that patient is safe to be discharged. Rocephin was ordered.  EMR states that patient has allergies to penicillins however patient is tolerated Rocephin in the past and so anticipate discharge with Keflex and doxycycline.  Patient be given a few days worth of pain meds as well.  Reviewed PDMP.  Patient was given return precautions. Patient stable for discharge at this time.  Patient verbalized understanding of plan.  This chart was dictated using voice recognition software.  Despite best efforts to proofread,  errors can occur which can change the documentation meaning.         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

## 2023-06-28 NOTE — Discharge Instructions (Signed)
Please follow-up with the orthopedic specialist have attached here for you today regards recent ER visit.  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  Your exam shows that you may have a skin infection and so you are given 1 dose IV antibiotics here but will need to pick up the rest the prescription.  You may take Tylenol every 6 hours needed for pain however pain not controlled by this Ameeth use the Norco I prescribed.  If symptoms change or worsen please return to ER.

## 2023-06-28 NOTE — ED Triage Notes (Signed)
Pt here via [facility] from home.  States wound to L knee x 2 weeks.  Friday pt noticed increased swelling and redness to L knee.    When EMS arrived pt was sitting on couch.  Initial bp 70/50.  Given 300 ns and 2nd bp (laying down) was 140/90.  Given 650 mg tylenol for temp of 101.3.  Pt ao x 4.

## 2023-06-29 ENCOUNTER — Ambulatory Visit: Payer: Medicare Other | Admitting: Podiatry

## 2023-06-29 DIAGNOSIS — L03116 Cellulitis of left lower limb: Secondary | ICD-10-CM | POA: Diagnosis not present

## 2023-07-02 DIAGNOSIS — M87051 Idiopathic aseptic necrosis of right femur: Secondary | ICD-10-CM | POA: Diagnosis not present

## 2023-07-02 DIAGNOSIS — Z794 Long term (current) use of insulin: Secondary | ICD-10-CM | POA: Diagnosis not present

## 2023-07-02 DIAGNOSIS — Z880 Allergy status to penicillin: Secondary | ICD-10-CM | POA: Diagnosis not present

## 2023-07-02 DIAGNOSIS — K219 Gastro-esophageal reflux disease without esophagitis: Secondary | ICD-10-CM | POA: Diagnosis not present

## 2023-07-02 DIAGNOSIS — M79605 Pain in left leg: Secondary | ICD-10-CM | POA: Diagnosis not present

## 2023-07-02 DIAGNOSIS — Z681 Body mass index (BMI) 19 or less, adult: Secondary | ICD-10-CM | POA: Diagnosis not present

## 2023-07-02 DIAGNOSIS — E10628 Type 1 diabetes mellitus with other skin complications: Secondary | ICD-10-CM | POA: Diagnosis not present

## 2023-07-02 DIAGNOSIS — E109 Type 1 diabetes mellitus without complications: Secondary | ICD-10-CM | POA: Diagnosis not present

## 2023-07-02 DIAGNOSIS — E440 Moderate protein-calorie malnutrition: Secondary | ICD-10-CM | POA: Diagnosis not present

## 2023-07-02 DIAGNOSIS — M87052 Idiopathic aseptic necrosis of left femur: Secondary | ICD-10-CM | POA: Diagnosis not present

## 2023-07-02 DIAGNOSIS — M329 Systemic lupus erythematosus, unspecified: Secondary | ICD-10-CM | POA: Diagnosis not present

## 2023-07-02 DIAGNOSIS — E876 Hypokalemia: Secondary | ICD-10-CM | POA: Diagnosis not present

## 2023-07-02 DIAGNOSIS — L03116 Cellulitis of left lower limb: Secondary | ICD-10-CM | POA: Diagnosis not present

## 2023-07-02 DIAGNOSIS — L80 Vitiligo: Secondary | ICD-10-CM | POA: Diagnosis not present

## 2023-07-02 DIAGNOSIS — D72829 Elevated white blood cell count, unspecified: Secondary | ICD-10-CM | POA: Diagnosis not present

## 2023-07-02 DIAGNOSIS — M7989 Other specified soft tissue disorders: Secondary | ICD-10-CM | POA: Diagnosis not present

## 2023-07-02 DIAGNOSIS — R1084 Generalized abdominal pain: Secondary | ICD-10-CM | POA: Diagnosis not present

## 2023-07-02 DIAGNOSIS — R197 Diarrhea, unspecified: Secondary | ICD-10-CM | POA: Diagnosis not present

## 2023-07-02 DIAGNOSIS — D649 Anemia, unspecified: Secondary | ICD-10-CM | POA: Diagnosis not present

## 2023-07-02 DIAGNOSIS — E785 Hyperlipidemia, unspecified: Secondary | ICD-10-CM | POA: Diagnosis not present

## 2023-07-03 DIAGNOSIS — L930 Discoid lupus erythematosus: Secondary | ICD-10-CM | POA: Insufficient documentation

## 2023-07-03 LAB — CULTURE, BLOOD (ROUTINE X 2)
Culture: NO GROWTH
Special Requests: ADEQUATE

## 2023-07-06 ENCOUNTER — Telehealth: Payer: Self-pay

## 2023-07-06 NOTE — Transitions of Care (Post Inpatient/ED Visit) (Signed)
   07/06/2023  Name: Jared Murray MRN: [id number] DOB: 10-18-1975  Today's TOC FU Call Status: Today's TOC FU Call Status:: Unsuccessful Call (1st Attempt) Unsuccessful Call (1st Attempt) Date: 07/06/23  Attempted to reach the patient regarding the most recent Inpatient/ED visit.  Follow Up Plan: Additional outreach attempts will be made to reach the patient to complete the Transitions of Care (Post Inpatient/ED visit) call.   Alyse Low, RN, BA, [facility], CRRN [facility] Coordinator, Transition of Care Ph # [phone]

## 2023-07-07 ENCOUNTER — Telehealth: Payer: Self-pay

## 2023-07-07 ENCOUNTER — Ambulatory Visit: Payer: Medicare Other | Admitting: Podiatry

## 2023-07-07 NOTE — Transitions of Care (Post Inpatient/ED Visit) (Signed)
07/07/2023  Name: Jared Murray MRN: [id number] DOB: 03-26-1976  Today's TOC FU Call Status:   Patient's Name and Date of Birth confirmed.  Transition Care Management Follow-up Telephone Call Discharge Facility: Other ([facility]) Name of Other ([facility]) Discharge Facility: [facility] Type of Discharge: Inpatient Admission Primary Inpatient Discharge Diagnosis:: Cellulitis Left Lower leg How have you been since you were released from the hospital?: Better Any questions or concerns?: No  Items Reviewed: Did you receive and understand the discharge instructions provided?: Yes Medications obtained,verified, and reconciled?: Yes (Medications Reviewed) Any new allergies since your discharge?: No Dietary orders reviewed?: Yes Type of Diet Ordered:: Regular diet with Glucerna 1.5 supplements recommended 3 times/day Do you have support at home?: Yes People in Home: spouse  Medications Reviewed Today: Medications Reviewed Today     Reviewed by Marcos Eke, RN (Registered Nurse) on 07/07/23 at 1103  Med List Status: <None>   Medication Order Taking? Sig Documenting Provider Last Dose Status Informant  Accu-Chek Softclix Lancets lancets [id number] No Use to check blood sugar three times daily. E10.65 Hoy Register, MD Taking Active   aspirin-sod bicarb-citric acid (ALKA-SELTZER) 325 MG TBEF tablet [id number] No Take 325 mg by mouth every 6 (six) hours as needed (heartburn). [provider] Taking Active Self  atorvastatin (LIPITOR) 20 MG tablet [id number] No TAKE 1 TABLET (20 MG TOTAL) BY MOUTH DAILY.  Patient taking differently: Take 20 mg by mouth daily.   Anders Simmonds, [place] Taking Expired 10/19/22 2359 Self           Med Note Hale Bogus   GEX Jan 04, 2022  9:20 AM) Patient hasn't started taking because he hasn't picked up from pharmacy.   betamethasone dipropionate (DIPROLENE) 0.05 % ointment [id number]  Apply to scalp, hands and feet  twice a day for 2 weeks then stop Terri Piedra, DO  Active   Blood Glucose Monitoring Suppl (ACCU-CHEK GUIDE) w/Device KIT [id number] No Use to check blood sugar 3 times daily. E10.65 Hoy Register, MD Taking Active   clobetasol ointment (TEMOVATE) 0.05 % [id number]  Apply 1 Application topically as directed. Bid to aa scalp, hands and feet for 2 weeks at a time, alternating with Tacrolimus ointment, avoid using on the face, axilla, groin Terri Piedra, DO  Active   clotrimazole-betamethasone (LOTRISONE) cream [id number] No Apply 1 Application topically daily. Helane Gunther, DPM Taking Active   Continuous Glucose Receiver [facility] G7 RECEIVER) DEVI [id number] No Use to check blood sugar up to 4 times daily. E10.65 Hoy Register, MD Taking Active   Continuous Glucose Sensor (DEXCOM G7 SENSOR) MISC [id number] No Use to check blood sugar up to 4 times daily. Change sensors once every 10 days. E10.65 Hoy Register, MD Taking Active   esomeprazole (NEXIUM) 40 MG capsule [id number] No Take 1 capsule (40 mg total) by mouth 2 (two) times daily before a meal. Hoy Register, MD Taking Active   famotidine (PEPCID) 20 MG tablet [id number] No Take 1 tablet (20 mg total) by mouth 2 (two) times daily. Hoy Register, MD Taking Active   glucose blood (ACCU-CHEK GUIDE) test strip [id number] No Use to check blood sugar three times daily. E10.65 Hoy Register, MD Taking Active   insulin aspart (FIASP FLEXTOUCH) 100 UNIT/ML FlexTouch Pen [id number]  <150, do not take insulin. 151-200, take 2 units. 201-250, take 3 units. 251-300, take 4 units. 301-350, take 5 units. 351-400, take 6 units. Above 400, call Endocrine. Hoy Register, MD  Active  Insulin Glargine (BASAGLAR KWIKPEN) 100 UNIT/ML [id number]  Inject 12 Units into the skin at bedtime. Hoy Register, MD  Active   INSULIN SYRINGE 1CC/29G 29G X 1/2" 1 ML MISC [id number] No use as directed  Patient taking differently: 1 each by Other route See admin  instructions. use as directed   Hoy Register, MD Taking Active Self  sildenafil (VIAGRA) 50 MG tablet [id number] No Take 1 tablet (50 mg total) by mouth daily as needed for erectile dysfunction (At least 24 hours between doses). Hoy Register, MD Taking Active Self  tacrolimus (PROTOPIC) 0.1 % ointment [id number]  Apply topically as directed. Bid to aa scalp, hands, feet for 2 weeks, alternating with Clobetasol ointment Terri Piedra, DO  Active   Tetrahydrozoline HCl (VISINE OP) [id number] No Place 1 drop into both eyes daily as needed (dryness / allergies). [provider] Taking Active Self            Home Care and Equipment/Supplies: Were Home Health Services Ordered?: No Any new equipment or medical supplies ordered?: No  Functional Questionnaire: Do you need assistance with bathing/showering or dressing?: No Do you need assistance with meal preparation?: No Do you need assistance with eating?: No Do you have difficulty maintaining continence: No Do you need assistance with getting out of bed/getting out of a chair/moving?: No Do you have difficulty managing or taking your medications?: No  Follow up appointments reviewed: PCP Follow-up appointment confirmed?: No (encouraged patient to call his PCP, Dr. Alvis Lemmings to set up an appt with her within the next week) Specialist Hospital Follow-up appointment confirmed?: Yes Date of Specialist follow-up appointment?: 07/07/23 Follow-Up Specialty Provider:: Has several upcoming specialist appointments scheduled including today with Helane Gunther re Diabetic foot care 11/19 @4 :15pm Do you need transportation to your follow-up appointment?: No Do you understand care options if your condition(s) worsen?: Yes-patient verbalized understanding  Alyse Low, RN, BA, [facility], CRRN [facility] Population Health Care Management Coordinator, Transition of Care Ph # [phone]

## 2023-07-21 DIAGNOSIS — E108 Type 1 diabetes mellitus with unspecified complications: Secondary | ICD-10-CM | POA: Diagnosis not present

## 2023-07-21 DIAGNOSIS — L03116 Cellulitis of left lower limb: Secondary | ICD-10-CM | POA: Diagnosis not present

## 2023-07-21 DIAGNOSIS — Z09 Encounter for follow-up examination after completed treatment for conditions other than malignant neoplasm: Secondary | ICD-10-CM | POA: Diagnosis not present

## 2023-07-21 DIAGNOSIS — E785 Hyperlipidemia, unspecified: Secondary | ICD-10-CM | POA: Diagnosis not present

## 2023-07-23 ENCOUNTER — Ambulatory Visit: Payer: Medicare Other | Admitting: Dietician

## 2023-07-24 ENCOUNTER — Ambulatory Visit: Payer: Medicare Other | Admitting: Pharmacist

## 2023-07-27 ENCOUNTER — Ambulatory Visit: Payer: Self-pay

## 2023-07-27 NOTE — Patient Instructions (Signed)
Visit Information  Thank you for taking time to visit with me today. Please don't hesitate to contact me if I can be of assistance to you.   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 Educated patient about basic disease process related DLE including treatment recommendations Determined patient experienced a recent inpatient event due to developing cellulitis to his lower extremity Determined patient continues to have a small wound on his left knee, he is adhering to his wound care as directed, reports the wound is healing   Instructed patient to schedule a post hospital follow up with PCP ASAP Educated patient about the risk for delayed healing due to diabetes Educated patient on signs/symptoms suggestive of wound infection and when to call his doctor and patient verbalizes understanding Discussed patient has a new patient appointment with a [facility] PCP in February 2025, he plans to transition from his [place] providers to [facility] providers  Patient will keep all scheduled appointments as directed Patient will contact his current PCP to schedule a post hospital follow up appointment ASAP Patient will continue to perform wound care as directed and report new symptoms or concerns to his doctor promptly         Our next appointment is by telephone on 09/14/23 at 11:30 AM  Please call the care guide team at [phone] if you need to cancel or reschedule your appointment.   If you are experiencing a  Mental Health or Behavioral Health Crisis or need someone to talk to, please call 1-800-273-TALK (toll free, 24 hour hotline)  Patient verbalizes understanding of instructions and care plan provided today and agrees to view in MyChart. Active MyChart status and patient understanding of how to access instructions and care plan via MyChart confirmed with patient.     Delsa Sale RN BSN CCM   [facility], [facility] Health Nurse Care Coordinator  Direct Dial: [phone] Website: [email or web address]

## 2023-07-27 NOTE — Patient Outreach (Signed)
  Care Coordination   Follow Up Visit Note   07/27/2023 Name: EZELL VOGELPOHL MRN: [id number] DOB: [birth year]  Theseus L Sinsel is a 47 y.o. year old male who sees Hoy Register, MD for primary care. I spoke with  [name] by phone today.  What matters to the patients health and wellness today?  Patient would like to have wound healing without complications.     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 Educated patient about basic disease process related DLE including treatment recommendations Determined patient experienced a recent inpatient event due to developing cellulitis to his lower extremity Determined patient continues to have a small wound on his left knee, he is adhering to his wound care as directed, reports the wound is healing   Instructed patient to schedule a post hospital follow up with PCP ASAP Educated patient about the risk for delayed healing due to diabetes Educated patient on signs/symptoms suggestive of wound infection and when to call his doctor and patient verbalizes understanding Discussed patient has a new patient appointment with a [facility] PCP in February 2025, he plans to transition from his [facility] providers to [facility] providers  Patient will keep all scheduled appointments as directed Patient will contact his current PCP to schedule a post hospital follow up appointment ASAP Patient will continue to perform wound care as directed and report new symptoms or concerns to his doctor promptly      Interventions Today    Flowsheet Row Most Recent Value  Chronic Disease   Chronic disease during today's visit Other, Diabetes  [cellulitis of left lower extremity,  Discoid Lupus]  General Interventions   General Interventions Discussed/Reviewed General Interventions Discussed, General Interventions Reviewed, Doctor Visits, Labs  Doctor Visits Discussed/Reviewed Doctor Visits Discussed, Doctor Visits Reviewed, Specialist  Education Interventions   Education Provided Provided Education  Provided Verbal Education On Medication, When to see the doctor, Labs  Labs Reviewed Hgb A1c, Kidney Function  Pharmacy Interventions   Pharmacy Dicussed/Reviewed Pharmacy Topics Discussed, Pharmacy Topics Reviewed, Medications and their functions          SDOH assessments and interventions completed:  No     Care Coordination Interventions:  Yes, provided   Follow up plan: Follow up call scheduled for 09/14/23 @11 :30 AM    Encounter Outcome:  Patient Visit Completed

## 2023-07-31 DIAGNOSIS — L03116 Cellulitis of left lower limb: Secondary | ICD-10-CM | POA: Diagnosis not present

## 2023-08-07 ENCOUNTER — Other Ambulatory Visit: Payer: Self-pay | Admitting: Pharmacist

## 2023-08-07 ENCOUNTER — Other Ambulatory Visit: Payer: Self-pay

## 2023-08-07 DIAGNOSIS — E1065 Type 1 diabetes mellitus with hyperglycemia: Secondary | ICD-10-CM

## 2023-08-07 MED ORDER — DEXCOM G7 SENSOR MISC: 6 refills | Status: DC

## 2023-08-10 ENCOUNTER — Other Ambulatory Visit: Payer: Self-pay | Admitting: Pharmacist

## 2023-08-10 ENCOUNTER — Telehealth: Payer: Self-pay | Admitting: Family Medicine

## 2023-08-10 DIAGNOSIS — E1065 Type 1 diabetes mellitus with hyperglycemia: Secondary | ICD-10-CM

## 2023-08-10 MED ORDER — DEXCOM G7 SENSOR MISC: 6 refills | Status: AC

## 2023-08-10 NOTE — Telephone Encounter (Signed)
Rxn for sensors resent to [facility].

## 2023-08-10 NOTE — Telephone Encounter (Signed)
Copied from CRM [id number]. Topic: General - Other >> Aug 07, 2023  4:08 PM Shon Hale wrote: Reason for CRM: Chinita Pester calling to confirm if request was received for patient's last visit notes. Request was faxed over to office on 08/05/2023.  If request was received Chinita Pester, is requesting the notes be faxed to their office.

## 2023-08-10 NOTE — Telephone Encounter (Signed)
Copied from CRM [id number]. Topic: General - Other >> Aug 07, 2023  1:20 PM Everette C wrote: Reason for CRM: The patient has called to request contact with a member of the pharmacy team regarding discussions about the sensors for their Dexcom glucose monitor   The patient has been in contact with [facility] which has told the patient that no requests for the sensors has been received and directed them to contact their PCP   Please contact the patient further when possible

## 2023-08-18 ENCOUNTER — Other Ambulatory Visit (HOSPITAL_COMMUNITY): Payer: Self-pay

## 2023-08-18 DIAGNOSIS — Z794 Long term (current) use of insulin: Secondary | ICD-10-CM | POA: Diagnosis not present

## 2023-08-18 DIAGNOSIS — E108 Type 1 diabetes mellitus with unspecified complications: Secondary | ICD-10-CM | POA: Diagnosis not present

## 2023-08-18 DIAGNOSIS — Z880 Allergy status to penicillin: Secondary | ICD-10-CM | POA: Diagnosis not present

## 2023-08-18 DIAGNOSIS — Z833 Family history of diabetes mellitus: Secondary | ICD-10-CM | POA: Diagnosis not present

## 2023-08-18 DIAGNOSIS — Z87891 Personal history of nicotine dependence: Secondary | ICD-10-CM | POA: Diagnosis not present

## 2023-08-18 DIAGNOSIS — Z91040 Latex allergy status: Secondary | ICD-10-CM | POA: Diagnosis not present

## 2023-08-18 DIAGNOSIS — K219 Gastro-esophageal reflux disease without esophagitis: Secondary | ICD-10-CM | POA: Diagnosis not present

## 2023-08-18 MED ORDER — DEXCOM G7 SENSOR MISC
1.0000 | 3 refills | Status: DC
  Filled 2023-08-18: qty 9, 90d supply, fill #0

## 2023-08-28 DIAGNOSIS — E108 Type 1 diabetes mellitus with unspecified complications: Secondary | ICD-10-CM | POA: Diagnosis not present

## 2023-09-01 ENCOUNTER — Ambulatory Visit (INDEPENDENT_AMBULATORY_CARE_PROVIDER_SITE_OTHER): Payer: Medicare Other | Admitting: Dermatology

## 2023-09-01 ENCOUNTER — Encounter: Payer: Self-pay | Admitting: Dermatology

## 2023-09-01 VITALS — BP 160/102 | HR 78

## 2023-09-01 DIAGNOSIS — L930 Discoid lupus erythematosus: Secondary | ICD-10-CM

## 2023-09-01 MED ORDER — TACROLIMUS 0.1 % EX OINT: TOPICAL_OINTMENT | CUTANEOUS | 5 refills | Status: DC

## 2023-09-01 MED ORDER — BETAMETHASONE DIPROPIONATE 0.05 % EX OINT: TOPICAL_OINTMENT | CUTANEOUS | 5 refills | Status: DC

## 2023-09-01 NOTE — Progress Notes (Signed)
   Follow-Up Visit   Subjective  Jared Murray is a 48 y.o. male who presents for the following: Discoid Lupus & Scalp Concerns  Patient present today for follow up visit for Discoid Lupus . Patient was last evaluated on 06/01/23. At this visit he was recommendeed to Continue alternating topical steroids: betamethasone  and tacrolimus  (two weeks on each, then switch) for a few months until the condition calms down. Patient report he currently is applying Patient reports sxs were doing very well with topicals but whit weather changes, have now gotten worse. Patient denies medication changes.  The following portions of the chart were reviewed this encounter and updated as appropriate: medications, allergies, medical history  Review of Systems:  No other skin or systemic complaints except as noted in HPI or Assessment and Plan.  Objective  Well appearing patient in no apparent distress; mood and affect are within normal limits.  A focused examination was performed of the following areas: Hands & Scalp  Relevant exam findings are noted in the Assessment and Plan.       Assessment & Plan   Discoid Lupus Erythematosus (DLE) Assessment: Patient with biopsy-proven Discoid Lupus Erythematosus showing significant improvement. Scalp lesions have improved with one area of hair loss fully regrown, another area showing some scarring with hair regrowth, and one previously ulcerated area healing. Hand lesions also demonstrate improvement with reduction in white patches, dryness, and return of pigmentation.  Plan:   Continue alternating topical regimen:     Apply Betamethasone  twice daily for 2 weeks.     Use Tacrolimus  for 2 weeks during steroid break.   New prescription for creams with 5 refills.   Moisturize hands after each washing.   Samples of Eucerin Advanced Repair and Aquaphor provided.   Follow-up in 6 months.   Emergency appointment if new spots appear or cream efficacy  diminishes.   Discussed option of adding hydroxychloroquine  (Plaquenil ), but deferred due to absence of systemic disease. DISCOID LUPUS ERYTHEMATOSUS   Related Medications betamethasone  dipropionate (DIPROLENE ) 0.05 % ointment Apply to scalp, hands and feet twice a day for 2 weeks then stop tacrolimus  (PROTOPIC ) 0.1 % ointment Apply topically as directed. Bid to aa scalp, hands, feet for 2 weeks, alternating with Clobetasol  ointment  Return in about 6 months (around 02/29/2024) for Discoid Lupus F/U.    Documentation: I have reviewed the above documentation for accuracy and completeness, and I agree with the above.  I, Jetta Ager, am acting as scribe for Delon Lenis, DO.  Delon Lenis, DO

## 2023-09-01 NOTE — Patient Instructions (Addendum)
 Hello Mr. Rony,  Thank you for visiting my office today. Your dedication to improving your health is commendable, and it's a pleasure to see the progress in your condition. Here is a summary of the key instructions from today's consultation:  Betamethasone  : Continue applying betamethasone  twice a day for two weeks. After this period, take a two-week break.   Following Break: During the break from betamethasone , switch to using tacrolimus  as instructed.  Moisturizing: After each hand wash, thoroughly moisturize your hands. Recommended products include Excedrin Advanced Repair and Aquaphor.  Scalp Care: Maintain your current scalp regimen to further encourage hair regrowth and aid in the healing of the ulcerated area.  Plaquenil  Consideration: We discussed the potential addition of Plaquenil  (hydroxychloroquine ) to your treatment plan. However, it remains optional at this stage, given the absence of a systemic disease.  Prescriptions: I will send a new prescription for your creams, including 5 refills.  Follow-Up Appointment: Please schedule a follow-up appointment in 6 months to monitor your progress.  Observation: Should you notice new spots or if the creams seem less effective, do not hesitate to contact our office immediately for an emergency appointment.  Samples Provided: You have been provided with samples of Excedrin and Aquaphor for your use.  Continue the excellent work, and I am looking forward to seeing all your symptoms resolved by your next visit. Have a great day!  Warm regards,  Dr. Delon Lenis,  Dermatology     Important Information   Due to recent changes in healthcare laws, you may see results of your pathology and/or laboratory studies on MyChart before the doctors have had a chance to review them. We understand that in some cases there may be results that are confusing or concerning to you. Please understand that not all results are received at the same  time and often the doctors may need to interpret multiple results in order to provide you with the best plan of care or course of treatment. Therefore, we ask that you please give us  2 business days to thoroughly review all your results before contacting the office for clarification. Should we see a critical lab result, you will be contacted sooner.     If You Need Anything After Your Visit   If you have any questions or concerns for your doctor, please call our main line at [phone]. If no one answers, please leave a voicemail as directed and we will return your call as soon as possible. Messages left after 4 pm will be answered the following business day.    You may also send us  a message via MyChart. We typically respond to MyChart messages within 1-2 business days.  For prescription refills, please ask your pharmacy to contact our office. Our fax number is [phone].  If you have an urgent issue when the clinic is closed that cannot wait until the next business day, you can page your doctor at the number below.     Please note that while we do our best to be available for urgent issues outside of office hours, we are not available 24/7.    If you have an urgent issue and are unable to reach us , you may choose to seek medical care at your doctor's office, retail clinic, urgent care center, or emergency room.   If you have a medical emergency, please immediately call 911 or go to the emergency department. In the event of inclement weather, please call our main line at [phone] for an update  on the status of any delays or closures.  Dermatology Medication Tips: Please keep the boxes that topical medications come in in order to help keep track of the instructions about where and how to use these. Pharmacies typically print the medication instructions only on the boxes and not directly on the medication tubes.   If your medication is too expensive, please contact our office at  [phone] or send us  a message through MyChart.    We are unable to tell what your co-pay for medications will be in advance as this is different depending on your insurance coverage. However, we may be able to find a substitute medication at lower cost or fill out paperwork to get insurance to cover a needed medication.    If a prior authorization is required to get your medication covered by your insurance company, please allow us  1-2 business days to complete this process.   Drug prices often vary depending on where the prescription is filled and some pharmacies may offer cheaper prices.   The website www.goodrx.com contains coupons for medications through different pharmacies. The prices here do not account for what the cost may be with help from insurance (it may be cheaper with your insurance), but the website can give you the price if you did not use any insurance.  - You can print the associated coupon and take it with your prescription to the pharmacy.  - You may also stop by our office during regular business hours and pick up a GoodRx coupon card.  - If you need your prescription sent electronically to a different pharmacy, notify our office through [facility] or by phone at [phone]

## 2023-09-07 ENCOUNTER — Telehealth: Payer: Self-pay

## 2023-09-07 NOTE — Telephone Encounter (Signed)
Patient contacted to schedule appointment for Follow-up A1C,.  Appointment for 10/22/2023.  patient to bring BS monitor and or log to appointment. Patient has appointment with PCP on 01/21/204 voiced that he was not going to able to attend. Changed to 09/30/2023

## 2023-09-08 ENCOUNTER — Ambulatory Visit: Payer: Medicare Other | Admitting: Family Medicine

## 2023-09-14 ENCOUNTER — Ambulatory Visit: Payer: Self-pay

## 2023-09-14 NOTE — Patient Outreach (Signed)
  Care Coordination   Follow Up Visit Note   09/14/2023 Name: Jared Murray MRN: [id number] DOB: 08/29/1975  Jared Murray is a 48 y.o. year old male who sees Hoy Register, MD for primary care. I spoke with  Jared Murray by phone today.  What matters to the patients health and wellness today?  Patient would like to continue working on lowering his A1C.     Goals Addressed             This Visit's Progress    To lower A1c <7.5%   On track    Care Coordination Interventions: Evaluation of current treatment plan related to type 1 diabetes mellitus and patient's adherence to plan as established by provider Review of patient status, including review of consultants reports, relevant laboratory and other test results, and medications completed  Plan: -Increase Basaglar to 11 units daily  -Start nutritional Novololg - ICR 1:15 -Continue correction Novolog - 150>150, scale in AVS  -Discussed hypoglycemia prevention and treatment.  -nasal glucagon prescribed today -Continue Dexcom G7 -Check MA/Cr -Check C-peptide, GAD65, islet cell, and ZnT8 antibodies to confirm DM1 diagnosis -CDE visit for diabetic diet, carb counting, insulin timing  Return in about 3 months (around 11/16/2023)   Determined patient verbalizes understanding of his prescribed treatment plan  Lab Results  Component Value Date   HGBA1C 9.4 (A) 07/02/2023         COMPLETED: To schedule appointment with Dermatology for evaluation of psoriasis       Care Coordination Interventions: Evaluation of current treatment plan related to Psoriasis and patient's adherence to plan as established by provider Determined patient completed a subsequent follow up with Dermatology 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 Review of patient status, including review of consultant's reports, relevant laboratory and other test results, and medications completed Determined patient verbalizes understanding of his prescribed treatment  plan  Instructed patient to report new symptoms or concerns to her doctor promptly  Discussed plans with patient for ongoing care coordination follow up and provided patient with direct contact information for nurse care coordinator    Interventions Today    Flowsheet Row Most Recent Value  Chronic Disease   Chronic disease during today's visit Other, Diabetes  [Discoid Lupus]  General Interventions   General Interventions Discussed/Reviewed General Interventions Discussed, General Interventions Reviewed, Doctor Visits  Doctor Visits Discussed/Reviewed Doctor Visits Discussed, Doctor Visits Reviewed, Specialist, PCP  Education Interventions   Education Provided Provided Education  Provided Verbal Education On When to see the doctor, Nutrition, Medication, Blood Sugar Monitoring  Nutrition Interventions   Nutrition Discussed/Reviewed Nutrition Reviewed, Nutrition Discussed, Carbohydrate meal planning, Portion sizes  Pharmacy Interventions   Pharmacy Dicussed/Reviewed Pharmacy Topics Discussed, Pharmacy Topics Reviewed, Medications and their functions         SDOH assessments and interventions completed:  Yes  SDOH Interventions Today    Flowsheet Row Most Recent Value  SDOH Interventions   Food Insecurity Interventions Intervention Not Indicated  Housing Interventions Intervention Not Indicated  Transportation Interventions Intervention Not Indicated  Utilities Interventions Intervention Not Indicated        Care Coordination Interventions:  Yes, provided   Follow up plan: Follow up call scheduled for 10/23/23 @11 :00 AM    Encounter Outcome:  Patient Visit Completed

## 2023-09-14 NOTE — Patient Instructions (Signed)
Visit Information  Thank you for taking time to visit with me today. Please don't hesitate to contact me if I can be of assistance to you.   Following are the goals we discussed today:   Goals Addressed             This Visit's Progress    To lower A1c <7.5%   On track    Care Coordination Interventions: Evaluation of current treatment plan related to type 1 diabetes mellitus and patient's adherence to plan as established by provider Review of patient status, including review of consultants reports, relevant laboratory and other test results, and medications completed  Plan: -Increase Basaglar to 11 units daily  -Start nutritional Novololg - ICR 1:15 -Continue correction Novolog - 150>150, scale in AVS  -Discussed hypoglycemia prevention and treatment.  -nasal glucagon prescribed today -Continue Dexcom G7 -Check MA/Cr -Check C-peptide, GAD65, islet cell, and ZnT8 antibodies to confirm DM1 diagnosis -CDE visit for diabetic diet, carb counting, insulin timing  Return in about 3 months (around 11/16/2023)   Determined patient verbalizes understanding of his prescribed treatment plan  Lab Results  Component Value Date   HGBA1C 9.4 (A) 07/02/2023         COMPLETED: To schedule appointment with Dermatology for evaluation of psoriasis       Care Coordination Interventions: Evaluation of current treatment plan related to Psoriasis and patient's adherence to plan as established by provider Determined patient completed a subsequent follow up with Dermatology 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 Review of patient status, including review of consultant's reports, relevant laboratory and other test results, and medications completed Determined patient verbalizes understanding of his prescribed treatment plan  Instructed patient to report new symptoms or concerns to her doctor promptly  Discussed plans with patient for ongoing care coordination follow up and provided patient with direct  contact information for nurse care coordinator        Our next appointment is by telephone on 10/23/23 at 11:00 AM  Please call the care guide team at [phone] if you need to cancel or reschedule your appointment.   If you are experiencing a Mental Health or Behavioral Health Crisis or need someone to talk to, please call 1-800-273-TALK (toll free, 24 hour hotline)  Patient verbalizes understanding of instructions and care plan provided today and agrees to view in MyChart. Active MyChart status and patient understanding of how to access instructions and care plan via MyChart confirmed with patient.     Delsa Sale RN BSN CCM [place]  [facility], [facility] Nurse Care Coordinator  Direct Dial: [phone] Website: [email or web address]

## 2023-09-23 DIAGNOSIS — M25551 Pain in right hip: Secondary | ICD-10-CM | POA: Diagnosis not present

## 2023-09-23 DIAGNOSIS — E108 Type 1 diabetes mellitus with unspecified complications: Secondary | ICD-10-CM | POA: Diagnosis not present

## 2023-09-23 DIAGNOSIS — M87051 Idiopathic aseptic necrosis of right femur: Secondary | ICD-10-CM | POA: Diagnosis not present

## 2023-09-23 DIAGNOSIS — E785 Hyperlipidemia, unspecified: Secondary | ICD-10-CM | POA: Diagnosis not present

## 2023-09-23 DIAGNOSIS — M25552 Pain in left hip: Secondary | ICD-10-CM | POA: Diagnosis not present

## 2023-09-23 DIAGNOSIS — M87052 Idiopathic aseptic necrosis of left femur: Secondary | ICD-10-CM | POA: Diagnosis not present

## 2023-09-23 DIAGNOSIS — Z125 Encounter for screening for malignant neoplasm of prostate: Secondary | ICD-10-CM | POA: Diagnosis not present

## 2023-09-23 DIAGNOSIS — F32A Depression, unspecified: Secondary | ICD-10-CM | POA: Diagnosis not present

## 2023-09-23 DIAGNOSIS — D649 Anemia, unspecified: Secondary | ICD-10-CM | POA: Diagnosis not present

## 2023-09-23 DIAGNOSIS — K219 Gastro-esophageal reflux disease without esophagitis: Secondary | ICD-10-CM | POA: Diagnosis not present

## 2023-09-30 ENCOUNTER — Ambulatory Visit: Payer: Medicare Other | Admitting: Family Medicine

## 2023-10-02 DIAGNOSIS — M87051 Idiopathic aseptic necrosis of right femur: Secondary | ICD-10-CM | POA: Diagnosis not present

## 2023-10-02 DIAGNOSIS — M3219 Other organ or system involvement in systemic lupus erythematosus: Secondary | ICD-10-CM | POA: Diagnosis not present

## 2023-10-02 DIAGNOSIS — D649 Anemia, unspecified: Secondary | ICD-10-CM | POA: Diagnosis not present

## 2023-10-02 DIAGNOSIS — K219 Gastro-esophageal reflux disease without esophagitis: Secondary | ICD-10-CM | POA: Diagnosis not present

## 2023-10-02 DIAGNOSIS — E785 Hyperlipidemia, unspecified: Secondary | ICD-10-CM | POA: Diagnosis not present

## 2023-10-02 DIAGNOSIS — M87052 Idiopathic aseptic necrosis of left femur: Secondary | ICD-10-CM | POA: Diagnosis not present

## 2023-10-02 DIAGNOSIS — R7989 Other specified abnormal findings of blood chemistry: Secondary | ICD-10-CM | POA: Diagnosis not present

## 2023-10-02 DIAGNOSIS — E108 Type 1 diabetes mellitus with unspecified complications: Secondary | ICD-10-CM | POA: Diagnosis not present

## 2023-10-05 DIAGNOSIS — R634 Abnormal weight loss: Secondary | ICD-10-CM | POA: Diagnosis not present

## 2023-10-05 DIAGNOSIS — L930 Discoid lupus erythematosus: Secondary | ICD-10-CM | POA: Diagnosis not present

## 2023-10-08 ENCOUNTER — Ambulatory Visit: Payer: Medicare Other | Admitting: Pharmacist

## 2023-10-19 DIAGNOSIS — R748 Abnormal levels of other serum enzymes: Secondary | ICD-10-CM | POA: Diagnosis not present

## 2023-10-19 DIAGNOSIS — K7581 Nonalcoholic steatohepatitis (NASH): Secondary | ICD-10-CM | POA: Diagnosis not present

## 2023-10-19 DIAGNOSIS — K760 Fatty (change of) liver, not elsewhere classified: Secondary | ICD-10-CM | POA: Diagnosis not present

## 2023-10-22 ENCOUNTER — Ambulatory Visit: Payer: Medicare Other | Admitting: Pharmacist

## 2023-10-23 ENCOUNTER — Ambulatory Visit: Payer: Self-pay

## 2023-10-23 NOTE — Patient Outreach (Signed)
 Care Coordination   10/23/2023 Name: Jared Murray MRN: [id number] DOB: Mar 19, 1976   Care Coordination Outreach Attempts:  An unsuccessful outreach was attempted for an appointment today.  Follow Up Plan:  Additional outreach attempts will be made to offer the patient complex care management information and services.   Encounter Outcome:  No Answer   Care Coordination Interventions:  No, not indicated    Delsa Sale RN BSN CCM [place]  [facility], [facility] Nurse Care Coordinator  Direct Dial: [phone] Website: [email or web address]

## 2023-11-03 DIAGNOSIS — K219 Gastro-esophageal reflux disease without esophagitis: Secondary | ICD-10-CM | POA: Diagnosis not present

## 2023-11-03 DIAGNOSIS — Z880 Allergy status to penicillin: Secondary | ICD-10-CM | POA: Diagnosis not present

## 2023-11-03 DIAGNOSIS — L309 Dermatitis, unspecified: Secondary | ICD-10-CM | POA: Diagnosis not present

## 2023-11-03 DIAGNOSIS — Z91040 Latex allergy status: Secondary | ICD-10-CM | POA: Diagnosis not present

## 2023-11-03 DIAGNOSIS — J40 Bronchitis, not specified as acute or chronic: Secondary | ICD-10-CM | POA: Diagnosis not present

## 2023-11-03 DIAGNOSIS — Z79899 Other long term (current) drug therapy: Secondary | ICD-10-CM | POA: Diagnosis not present

## 2023-11-03 DIAGNOSIS — Z20822 Contact with and (suspected) exposure to covid-19: Secondary | ICD-10-CM | POA: Diagnosis not present

## 2023-11-03 DIAGNOSIS — Z87891 Personal history of nicotine dependence: Secondary | ICD-10-CM | POA: Diagnosis not present

## 2023-11-03 DIAGNOSIS — Z794 Long term (current) use of insulin: Secondary | ICD-10-CM | POA: Diagnosis not present

## 2023-11-03 DIAGNOSIS — R630 Anorexia: Secondary | ICD-10-CM | POA: Diagnosis not present

## 2023-11-03 DIAGNOSIS — I4510 Unspecified right bundle-branch block: Secondary | ICD-10-CM | POA: Diagnosis not present

## 2023-11-03 DIAGNOSIS — E109 Type 1 diabetes mellitus without complications: Secondary | ICD-10-CM | POA: Diagnosis not present

## 2023-11-03 DIAGNOSIS — I517 Cardiomegaly: Secondary | ICD-10-CM | POA: Diagnosis not present

## 2023-11-03 DIAGNOSIS — R918 Other nonspecific abnormal finding of lung field: Secondary | ICD-10-CM | POA: Diagnosis not present

## 2023-12-03 ENCOUNTER — Telehealth: Payer: Self-pay | Admitting: *Deleted

## 2023-12-03 NOTE — Progress Notes (Signed)
 Complex Care Management Care Guide Note  12/03/2023 Name: Jared Murray MRN: [id number] DOB: 12-06-1975  Jared Murray is a 48 y.o. year old male who is a primary care patient of Newlin, Enobong, MD and is actively engaged with the care management team. I reached out to Larraine Plush by phone today to assist with re-scheduling  with the RN Case Manager.  Follow up plan: Unsuccessful telephone outreach attempt made.  [name]  [facility]  [facility], [facility] Guide  Direct Dial: [phone]  Fax [phone]

## 2023-12-15 NOTE — Progress Notes (Signed)
 Complex Care Management Care Guide Note  12/15/2023 Name: Jared Murray MRN: [id number] DOB: 1976-04-07  [name] is a 48 y.o. year old male who is a primary care patient of Newlin, Enobong, MD and is actively engaged with the care management team. I reached out to Larraine Plush by phone today to assist with re-scheduling  with the RN Case Manager.  Follow up plan: Unsuccessful telephone outreach attempt made. A HIPAA compliant phone message was left for the patient providing contact information and requesting a return call.No further outreach attempts will be made at this time. We have been unable to contact the patient to reschedule for complex care management services.   [name]  [facility]  [facility], [facility] Guide  Direct Dial : [phone]  Fax [phone]

## 2024-01-04 DIAGNOSIS — E108 Type 1 diabetes mellitus with unspecified complications: Secondary | ICD-10-CM | POA: Diagnosis not present

## 2024-01-04 DIAGNOSIS — D649 Anemia, unspecified: Secondary | ICD-10-CM | POA: Diagnosis not present

## 2024-01-04 DIAGNOSIS — F411 Generalized anxiety disorder: Secondary | ICD-10-CM | POA: Diagnosis not present

## 2024-01-04 DIAGNOSIS — M87052 Idiopathic aseptic necrosis of left femur: Secondary | ICD-10-CM | POA: Diagnosis not present

## 2024-01-04 DIAGNOSIS — K219 Gastro-esophageal reflux disease without esophagitis: Secondary | ICD-10-CM | POA: Diagnosis not present

## 2024-01-04 DIAGNOSIS — K529 Noninfective gastroenteritis and colitis, unspecified: Secondary | ICD-10-CM | POA: Diagnosis not present

## 2024-01-04 DIAGNOSIS — R748 Abnormal levels of other serum enzymes: Secondary | ICD-10-CM | POA: Diagnosis not present

## 2024-01-04 DIAGNOSIS — E785 Hyperlipidemia, unspecified: Secondary | ICD-10-CM | POA: Diagnosis not present

## 2024-01-04 DIAGNOSIS — M87051 Idiopathic aseptic necrosis of right femur: Secondary | ICD-10-CM | POA: Diagnosis not present

## 2024-01-04 DIAGNOSIS — Z1211 Encounter for screening for malignant neoplasm of colon: Secondary | ICD-10-CM | POA: Diagnosis not present

## 2024-01-04 DIAGNOSIS — L97515 Non-pressure chronic ulcer of other part of right foot with muscle involvement without evidence of necrosis: Secondary | ICD-10-CM | POA: Diagnosis not present

## 2024-01-04 DIAGNOSIS — L930 Discoid lupus erythematosus: Secondary | ICD-10-CM | POA: Diagnosis not present

## 2024-01-08 DIAGNOSIS — Z1211 Encounter for screening for malignant neoplasm of colon: Secondary | ICD-10-CM | POA: Insufficient documentation

## 2024-01-08 DIAGNOSIS — L97515 Non-pressure chronic ulcer of other part of right foot with muscle involvement without evidence of necrosis: Secondary | ICD-10-CM | POA: Insufficient documentation

## 2024-01-18 DIAGNOSIS — E108 Type 1 diabetes mellitus with unspecified complications: Secondary | ICD-10-CM | POA: Diagnosis not present

## 2024-01-22 DIAGNOSIS — N200 Calculus of kidney: Secondary | ICD-10-CM | POA: Diagnosis not present

## 2024-01-22 DIAGNOSIS — K529 Noninfective gastroenteritis and colitis, unspecified: Secondary | ICD-10-CM | POA: Diagnosis not present

## 2024-01-25 DIAGNOSIS — Z1211 Encounter for screening for malignant neoplasm of colon: Secondary | ICD-10-CM | POA: Diagnosis not present

## 2024-01-30 LAB — COLOGUARD: COLOGUARD: NEGATIVE

## 2024-02-04 DIAGNOSIS — M25552 Pain in left hip: Secondary | ICD-10-CM | POA: Diagnosis not present

## 2024-02-04 DIAGNOSIS — F411 Generalized anxiety disorder: Secondary | ICD-10-CM | POA: Diagnosis not present

## 2024-02-04 DIAGNOSIS — M25551 Pain in right hip: Secondary | ICD-10-CM | POA: Diagnosis not present

## 2024-02-04 DIAGNOSIS — L97519 Non-pressure chronic ulcer of other part of right foot with unspecified severity: Secondary | ICD-10-CM | POA: Diagnosis not present

## 2024-02-04 DIAGNOSIS — E1043 Type 1 diabetes mellitus with diabetic autonomic (poly)neuropathy: Secondary | ICD-10-CM | POA: Diagnosis not present

## 2024-02-04 DIAGNOSIS — E10621 Type 1 diabetes mellitus with foot ulcer: Secondary | ICD-10-CM | POA: Diagnosis not present

## 2024-02-04 DIAGNOSIS — K529 Noninfective gastroenteritis and colitis, unspecified: Secondary | ICD-10-CM | POA: Diagnosis not present

## 2024-02-04 DIAGNOSIS — E1143 Type 2 diabetes mellitus with diabetic autonomic (poly)neuropathy: Secondary | ICD-10-CM | POA: Insufficient documentation

## 2024-02-04 DIAGNOSIS — K3184 Gastroparesis: Secondary | ICD-10-CM | POA: Diagnosis not present

## 2024-02-08 DIAGNOSIS — E1042 Type 1 diabetes mellitus with diabetic polyneuropathy: Secondary | ICD-10-CM | POA: Diagnosis not present

## 2024-02-08 DIAGNOSIS — E10621 Type 1 diabetes mellitus with foot ulcer: Secondary | ICD-10-CM | POA: Diagnosis not present

## 2024-02-08 DIAGNOSIS — L84 Corns and callosities: Secondary | ICD-10-CM | POA: Diagnosis not present

## 2024-02-08 DIAGNOSIS — L603 Nail dystrophy: Secondary | ICD-10-CM | POA: Diagnosis not present

## 2024-02-24 DIAGNOSIS — E108 Type 1 diabetes mellitus with unspecified complications: Secondary | ICD-10-CM | POA: Diagnosis not present

## 2024-03-01 ENCOUNTER — Encounter: Payer: Self-pay | Admitting: Dermatology

## 2024-03-01 ENCOUNTER — Other Ambulatory Visit: Payer: Self-pay

## 2024-03-01 ENCOUNTER — Ambulatory Visit: Payer: Medicare Other | Admitting: Dermatology

## 2024-03-01 VITALS — BP 154/102 | HR 83

## 2024-03-01 DIAGNOSIS — L930 Discoid lupus erythematosus: Secondary | ICD-10-CM | POA: Diagnosis not present

## 2024-03-01 MED ORDER — CLOBETASOL PROPIONATE 0.05 % EX OINT: 1.0000 | TOPICAL_OINTMENT | Freq: Two times a day (BID) | CUTANEOUS | 11 refills | Status: DC

## 2024-03-01 MED ORDER — BETAMETHASONE DIPROPIONATE 0.05 % EX OINT: TOPICAL_OINTMENT | CUTANEOUS | 2 refills | Status: DC

## 2024-03-01 MED ORDER — HYDROXYCHLOROQUINE SULFATE 200 MG PO TABS: 200.0000 mg | ORAL_TABLET | Freq: Two times a day (BID) | ORAL | 11 refills | Status: DC

## 2024-03-01 NOTE — Progress Notes (Signed)
   Follow-Up Visit   Subjective  Jared Murray is a 48 y.o. male who presents for the following: Discoid Lupus  Patient present today for follow up visit for Discoid Lupus. Patient was last evaluated on 09/01/23. At this visit patient was advised to continue alternating Betamethasone  and Tacrolimus . Patient reports sxs are worse. Patient reports he is continuing to alternate the Betamethasone  and Tacrolimus  but it doesn't seem to be helping anymore. Patient denies medication changes.  The following portions of the chart were reviewed this encounter and updated as appropriate: medications, allergies, medical history  Review of Systems:  No other skin or systemic complaints except as noted in HPI or Assessment and Plan.  Objective  Well appearing patient in no apparent distress; mood and affect are within normal limits.  A focused examination was performed of the following areas: Hands and Scalp  Relevant exam findings are noted in the Assessment and Plan.                   Assessment & Plan   1. Cutaneous Lupus Erythematosus - Assessment: Patient's lupus symptoms have worsened, likely exacerbated by recent stress due to marital issues and separation. Physical examination reveals hyper and hypopigmented sclerotic plaques on bilateral elbows, dorsal surface of hands, and a growing sclerotic plaque on the scalp with hyperpigmentation. Current topical treatments with clobetasol  and tacrolimus  have not provided adequate control, necessitating escalation of therapy.  - Plan:    Initiate hydroxychloroquine  (Plaquenil ) 200 mg PO BID     - Patient informed of need for baseline eye exam within the next month and annual eye screenings thereafter to monitor for corneal deposits     - Discussed safety profile, including lack of impact on blood pressure or blood sugar    Adjust topical regimen:     - Switch to clobetasol  ointment (stronger formulation) for both scalp and body     -  Continue tacrolimus  topically    Discussed option of intralesional steroid injections for scalp lesions; patient prefers to try oral medication first    Patient education:     - Informed about the need for baseline and annual eye exams     - Discussed safety profile of hydroxychloroquine   Follow-up in 4 months to assess response to new treatment regimen. If no improvement or worsening of scalp lesions at follow-up, consider intralesional steroid injections.  DISCOID LUPUS ERYTHEMATOSUS   Related Medications tacrolimus  (PROTOPIC ) 0.1 % ointment Apply topically as directed. Bid to aa scalp, hands, feet for 2 weeks, alternating with Clobetasol  ointment hydroxychloroquine  (PLAQUENIL ) 200 MG tablet Take 1 tablet (200 mg total) by mouth 2 (two) times daily. clobetasol  ointment (TEMOVATE ) 0.05 % Apply 1 Application topically 2 (two) times daily. Apply 2 times daily allowing to stay on hands for up to 30 minutes before washing off for 3 weeks then STOP for 2 Weeks  Return in about 4 months (around 07/02/2024) for Discoid Lupus F/U.  I, Jetta Ager, am acting as Neurosurgeon for [employer], DO.  Documentation: I have reviewed the above documentation for accuracy and completeness, and I agree with the above.  Delon Lenis, DO

## 2024-03-01 NOTE — Patient Instructions (Addendum)
 Date: Tue Mar 01 2024  Dear Jared Murray,  Thank you for visiting today. Here is a summary of the key instructions:  - Medications:   - Start taking Plaquenil  (hydroxychloroquine ) 200 mg twice a day   - Apply clobetasol  and tacrolimus  on body and scalp (2 weeks clobetasol  then 2 weeks tacrolimus )   - Switch to clobetasol  ointment for scalp and body (stronger than mometasone )  - Eye Exam:   - Get an eye exam within the next month for Plaquenil  screening   - Inform the eye doctor it's for Plaquenil  screening   - Continue annual eye exams while on Plaquenil   - Follow-up:   - Return for follow-up appointment in 4 months   - Be prepared for possible scalp injections at next visit if condition doesn't improve  - Other Instructions:   - Continue using topical medications as prescribed   - Watch for improvement in skin condition, especially on scalp   - If scalp condition worsens or doesn't improve, injections may be considered at next visit  Please reach out if you have any questions or concerns.  Best Regards,  Dr. Delon Lenis, Dermatology      Important Information   Due to recent changes in healthcare laws, you may see results of your pathology and/or laboratory studies on MyChart before the doctors have had a chance to review them. We understand that in some cases there may be results that are confusing or concerning to you. Please understand that not all results are received at the same time and often the doctors may need to interpret multiple results in order to provide you with the best plan of care or course of treatment. Therefore, we ask that you please give us  2 business days to thoroughly review all your results before contacting the office for clarification. Should we see a critical lab result, you will be contacted sooner.     If You Need Anything After Your Visit   If you have any questions or concerns for your doctor, please call our main line at [phone]. If  no one answers, please leave a voicemail as directed and we will return your call as soon as possible. Messages left after 4 pm will be answered the following business day.    You may also send us  a message via MyChart. We typically respond to MyChart messages within 1-2 business days.  For prescription refills, please ask your pharmacy to contact our office. Our fax number is [phone].  If you have an urgent issue when the clinic is closed that cannot wait until the next business day, you can page your doctor at the number below.     Please note that while we do our best to be available for urgent issues outside of office hours, we are not available 24/7.    If you have an urgent issue and are unable to reach us , you may choose to seek medical care at your doctor's office, retail clinic, urgent care center, or emergency room.   If you have a medical emergency, please immediately call 911 or go to the emergency department. In the event of inclement weather, please call our main line at [phone] for an update on the status of any delays or closures.  Dermatology Medication Tips: Please keep the boxes that topical medications come in in order to help keep track of the instructions about where and how to use these. Pharmacies typically print the medication instructions only on the boxes and  not directly on the medication tubes.   If your medication is too expensive, please contact our office at [phone] or send us  a message through MyChart.    We are unable to tell what your co-pay for medications will be in advance as this is different depending on your insurance coverage. However, we may be able to find a substitute medication at lower cost or fill out paperwork to get insurance to cover a needed medication.    If a prior authorization is required to get your medication covered by your insurance company, please allow us  1-2 business days to complete this process.   Drug prices  often vary depending on where the prescription is filled and some pharmacies may offer cheaper prices.   The website www.goodrx.com contains coupons for medications through different pharmacies. The prices here do not account for what the cost may be with help from insurance (it may be cheaper with your insurance), but the website can give you the price if you did not use any insurance.  - You can print the associated coupon and take it with your prescription to the pharmacy.  - You may also stop by our office during regular business hours and pick up a GoodRx coupon card.  - If you need your prescription sent electronically to a different pharmacy, notify our office through [facility] or by phone at [phone]

## 2024-03-01 NOTE — Progress Notes (Unsigned)
 Per pharmacy, rx changed

## 2024-03-04 DIAGNOSIS — R7989 Other specified abnormal findings of blood chemistry: Secondary | ICD-10-CM | POA: Diagnosis not present

## 2024-03-04 DIAGNOSIS — R935 Abnormal findings on diagnostic imaging of other abdominal regions, including retroperitoneum: Secondary | ICD-10-CM | POA: Diagnosis not present

## 2024-03-04 DIAGNOSIS — K219 Gastro-esophageal reflux disease without esophagitis: Secondary | ICD-10-CM | POA: Diagnosis not present

## 2024-03-04 DIAGNOSIS — K529 Noninfective gastroenteritis and colitis, unspecified: Secondary | ICD-10-CM | POA: Diagnosis not present

## 2024-03-04 DIAGNOSIS — D508 Other iron deficiency anemias: Secondary | ICD-10-CM | POA: Diagnosis not present

## 2024-04-06 DIAGNOSIS — Z79899 Other long term (current) drug therapy: Secondary | ICD-10-CM | POA: Diagnosis not present

## 2024-04-06 DIAGNOSIS — H524 Presbyopia: Secondary | ICD-10-CM | POA: Diagnosis not present

## 2024-04-06 DIAGNOSIS — E109 Type 1 diabetes mellitus without complications: Secondary | ICD-10-CM | POA: Diagnosis not present

## 2024-05-02 DIAGNOSIS — Z79899 Other long term (current) drug therapy: Secondary | ICD-10-CM | POA: Diagnosis not present

## 2024-05-04 ENCOUNTER — Ambulatory Visit: Payer: Medicare Other

## 2024-05-09 DIAGNOSIS — R7989 Other specified abnormal findings of blood chemistry: Secondary | ICD-10-CM | POA: Diagnosis not present

## 2024-05-09 DIAGNOSIS — E108 Type 1 diabetes mellitus with unspecified complications: Secondary | ICD-10-CM | POA: Diagnosis not present

## 2024-05-11 ENCOUNTER — Ambulatory Visit: Payer: Medicare Other

## 2024-05-20 DIAGNOSIS — E108 Type 1 diabetes mellitus with unspecified complications: Secondary | ICD-10-CM | POA: Diagnosis not present

## 2024-05-20 DIAGNOSIS — Z09 Encounter for follow-up examination after completed treatment for conditions other than malignant neoplasm: Secondary | ICD-10-CM | POA: Diagnosis not present

## 2024-05-20 DIAGNOSIS — E43 Unspecified severe protein-calorie malnutrition: Secondary | ICD-10-CM | POA: Diagnosis not present

## 2024-05-20 DIAGNOSIS — K219 Gastro-esophageal reflux disease without esophagitis: Secondary | ICD-10-CM | POA: Diagnosis not present

## 2024-05-20 DIAGNOSIS — K529 Noninfective gastroenteritis and colitis, unspecified: Secondary | ICD-10-CM | POA: Diagnosis not present

## 2024-05-20 DIAGNOSIS — R634 Abnormal weight loss: Secondary | ICD-10-CM | POA: Diagnosis not present

## 2024-05-20 DIAGNOSIS — D649 Anemia, unspecified: Secondary | ICD-10-CM | POA: Diagnosis not present

## 2024-05-20 DIAGNOSIS — F411 Generalized anxiety disorder: Secondary | ICD-10-CM | POA: Diagnosis not present

## 2024-05-20 DIAGNOSIS — R110 Nausea: Secondary | ICD-10-CM | POA: Diagnosis not present

## 2024-06-01 DIAGNOSIS — E1042 Type 1 diabetes mellitus with diabetic polyneuropathy: Secondary | ICD-10-CM | POA: Diagnosis not present

## 2024-06-01 DIAGNOSIS — L603 Nail dystrophy: Secondary | ICD-10-CM | POA: Diagnosis not present

## 2024-06-01 DIAGNOSIS — L84 Corns and callosities: Secondary | ICD-10-CM | POA: Diagnosis not present

## 2024-06-10 ENCOUNTER — Telehealth: Payer: Self-pay

## 2024-06-10 NOTE — Telephone Encounter (Signed)
 Pt was called and no vm was left due to mailbox being full. Called to put him on the schedule with Newlin on 10/31

## 2024-06-22 ENCOUNTER — Ambulatory Visit: Admitting: Dermatology

## 2024-06-22 ENCOUNTER — Encounter: Payer: Self-pay | Admitting: Dermatology

## 2024-06-22 DIAGNOSIS — J3081 Allergic rhinitis due to animal (cat) (dog) hair and dander: Secondary | ICD-10-CM

## 2024-06-22 DIAGNOSIS — L930 Discoid lupus erythematosus: Secondary | ICD-10-CM | POA: Diagnosis not present

## 2024-06-22 DIAGNOSIS — T7840XA Allergy, unspecified, initial encounter: Secondary | ICD-10-CM

## 2024-06-22 MED ORDER — TACROLIMUS 0.1 % EX OINT
TOPICAL_OINTMENT | CUTANEOUS | 5 refills | Status: AC
Start: 2024-06-22 — End: ?

## 2024-06-22 MED ORDER — BETAMETHASONE DIPROPIONATE 0.05 % EX OINT: TOPICAL_OINTMENT | CUTANEOUS | 2 refills | Status: AC

## 2024-06-22 MED ORDER — HYDROXYCHLOROQUINE SULFATE 200 MG PO TABS: 200.0000 mg | ORAL_TABLET | Freq: Two times a day (BID) | ORAL | 11 refills | Status: AC

## 2024-06-22 NOTE — Progress Notes (Signed)
   Follow-Up Visit   Subjective  Jared Murray is a 48 y.o. male established patient who presents for FOLLOW UP on the diagnoses listed below:  Patient was last evaluated on 03/01/24.   Cutaneous Lupus Erythematosus : Prescribed Plaquenil  200 mg PO BID & alternating clobetasol /tacrolimus  BID every 2 weeks. Pt was not able to obtain clobetasol  due to insurance coverage so Rx was changed to betamethasone . He stated at first he did not see improvement with using alternating topicals when first prescribed but he is appreciating how the hands are looking today with consistent use. He also noted hair regrown in the previously bare area. He has been taking Plaquenil  daily.    The following portions of the chart were reviewed this encounter and updated as appropriate: medications, allergies, medical history  Review of Systems:  No other skin or systemic complaints except as noted in HPI or Assessment and Plan.  Objective  Well appearing patient in no apparent distress; mood and affect are within normal limits.   A focused examination was performed of the following areas: hands & scalp   Relevant exam findings are noted in the Assessment and Plan.               Assessment & Plan   Discoid lupus erythematosus (Much Improved) Improving with treatment. Large, scaly, scarring plaques on the scalp are shrinking. Skin condition is improving with current regimen of Plaquenil  and topical treatments. Continued use of Plaquenil  is essential to prevent new lesions and systemic lupus involvement. - Continue Plaquenil  twice daily. - Continue betamethasone  cream twice daily for two weeks, then switch to tacrolimus  cream for two weeks. - Use Neutrogena Norwegian hand cream after washing hands to maintain skin moisture. - Will schedule follow-up in one year.  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  Symptoms occur when exposed to dogs, particularly his son's dog. Flea protection for the dog is advised to rule out flea-related itching. - Ordered blood test for dog dander allergy at [facility]. - Advised taking Zyrtec or Claritin daily, especially before and during exposure to the dog. DISCOID LUPUS ERYTHEMATOSUS   Related Medications tacrolimus  (PROTOPIC ) 0.1 % ointment Apply topically as directed. Bid to aa scalp, hands, feet for 2 weeks, alternating with Clobetasol  ointment hydroxychloroquine  (PLAQUENIL ) 200 MG tablet Take 1 tablet (200 mg total) by mouth 2 (two) times daily.  Return in about 1 year (around 06/22/2025) for CLE.   Documentation: I have reviewed the above documentation for accuracy and completeness, and I agree with the above.  I, Shirron Maranda, CMA, am acting as scribe for [employer], DO.   Delon Lenis, DO

## 2024-06-22 NOTE — Patient Instructions (Addendum)
 VISIT SUMMARY:  Today, you had a follow-up appointment to check on your lupus and discuss your skin condition and allergic symptoms. Your skin condition is improving with the current treatment, and we also discussed your allergic reactions to dog dander.  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 Your skin condition is improving with the current treatment of Plaquenil  and topical creams.  Continue taking Plaquenil  twice daily and apply betamethasone  cream twice daily for two weeks, then switch to tacrolimus  cream for another two weeks. Use Neutrogena Norwegian hand cream after washing your hands to keep your skin moisturized. We will schedule a follow-up in one year.  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 We have ordered a blood test to confirm the allergy.  In the meantime, take Zyrtec or Claritin daily, especially before and during exposure to the dog. Ensure the dog has flea protection to rule out flea-related itching.  INSTRUCTIONS:  Please continue with your current medications and follow the treatment plan for your skin condition. Take the prescribed allergy medications as needed and get the blood test for dog dander allergy at [facility]. We will schedule a follow-up appointment in one year to monitor your progress. Important Information  Due to recent changes in healthcare laws, you may see results of your pathology and/or laboratory studies on MyChart before the doctors have had a chance to review them. We understand that in some cases there may be results that are confusing or concerning to you. Please understand that not all results are received at the same time and often the doctors may need to interpret multiple results in order to provide you with the  best plan of care or course of treatment. Therefore, we ask that you please give us  2 business days to thoroughly review all your results before contacting the office for clarification. Should we see a critical lab result, you will be contacted sooner.   If You Need Anything After Your Visit  If you have any questions or concerns for your doctor, please call our main line at [phone] If no one answers, please leave a voicemail as directed and we will return your call as soon as possible. Messages left after 4 pm will be answered the following business day.   You may also send us  a message via MyChart. We typically respond to MyChart messages within 1-2 business days.  For prescription refills, please ask your pharmacy to contact our office. Our fax number is [phone].  If you have an urgent issue when the clinic is closed that cannot wait until the next business day, you can page your doctor at the number below.    Please note that while we do our best to be available for urgent issues outside of office hours, we are not available 24/7.   If you have an urgent issue and are unable to reach us , you may choose to seek medical care at your doctor's office, retail clinic, urgent care center, or emergency room.  If you have a medical emergency, please immediately call 911 or go to the emergency department. In the event of inclement weather, please call our main line at [phone] for an update on the status of any delays or closures.  Dermatology Medication Tips: Please keep the boxes that topical medications come  in in order to help keep track of the instructions about where and how to use these. Pharmacies typically print the medication instructions only on the boxes and not directly on the medication tubes.   If your medication is too expensive, please contact our office at [phone] or send us  a message through MyChart.   We are unable to tell what your co-pay for medications  will be in advance as this is different depending on your insurance coverage. However, we may be able to find a substitute medication at lower cost or fill out paperwork to get insurance to cover a needed medication.   If a prior authorization is required to get your medication covered by your insurance company, please allow us  1-2 business days to complete this process.  Drug prices often vary depending on where the prescription is filled and some pharmacies may offer cheaper prices.  The website www.goodrx.com contains coupons for medications through different pharmacies. The prices here do not account for what the cost may be with help from insurance (it may be cheaper with your insurance), but the website can give you the price if you did not use any insurance.  - You can print the associated coupon and take it with your prescription to the pharmacy.  - You may also stop by our office during regular business hours and pick up a GoodRx coupon card.  - If you need your prescription sent electronically to a different pharmacy, notify our office through [facility] or by phone at [phone]

## 2024-06-29 DIAGNOSIS — T7840XA Allergy, unspecified, initial encounter: Secondary | ICD-10-CM | POA: Diagnosis not present

## 2024-07-01 LAB — ALLERGEN, DOG DANDER, E5: Dog Dander IgE: 44.3 kU/L — AB

## 2024-07-06 ENCOUNTER — Ambulatory Visit: Payer: Self-pay | Admitting: Dermatology

## 2024-07-20 DIAGNOSIS — K219 Gastro-esophageal reflux disease without esophagitis: Secondary | ICD-10-CM | POA: Diagnosis not present

## 2024-07-20 DIAGNOSIS — E46 Unspecified protein-calorie malnutrition: Secondary | ICD-10-CM | POA: Diagnosis not present

## 2024-07-20 DIAGNOSIS — F32A Depression, unspecified: Secondary | ICD-10-CM | POA: Diagnosis not present

## 2024-07-20 DIAGNOSIS — E108 Type 1 diabetes mellitus with unspecified complications: Secondary | ICD-10-CM | POA: Diagnosis not present

## 2024-07-20 DIAGNOSIS — D509 Iron deficiency anemia, unspecified: Secondary | ICD-10-CM | POA: Diagnosis not present

## 2024-07-25 ENCOUNTER — Telehealth: Payer: Self-pay

## 2024-07-25 NOTE — Telephone Encounter (Signed)
 Patient was identified as falling into the [place] Measure - Diabetes.   Patient was: Requires a call back at a later time. No answer and no Vm at this time. Last seen by endo in September but no recent A1C on file.

## 2024-07-30 ENCOUNTER — Other Ambulatory Visit: Payer: Self-pay

## 2024-07-30 ENCOUNTER — Emergency Department (HOSPITAL_COMMUNITY)

## 2024-07-30 ENCOUNTER — Observation Stay (HOSPITAL_COMMUNITY)
Admission: EM | Admit: 2024-07-30 | Discharge: 2024-07-31 | Disposition: A | Discharge status: Home / Self Care | Attending: Internal Medicine | Admitting: Internal Medicine

## 2024-07-30 DIAGNOSIS — R638 Other symptoms and signs concerning food and fluid intake: Secondary | ICD-10-CM

## 2024-07-30 DIAGNOSIS — F411 Generalized anxiety disorder: Secondary | ICD-10-CM | POA: Diagnosis not present

## 2024-07-30 DIAGNOSIS — K219 Gastro-esophageal reflux disease without esophagitis: Secondary | ICD-10-CM | POA: Diagnosis present

## 2024-07-30 DIAGNOSIS — R7401 Elevation of levels of liver transaminase levels: Secondary | ICD-10-CM | POA: Diagnosis present

## 2024-07-30 DIAGNOSIS — Z91199 Patient's noncompliance with other medical treatment and regimen due to unspecified reason: Secondary | ICD-10-CM

## 2024-07-30 DIAGNOSIS — E162 Hypoglycemia, unspecified: Secondary | ICD-10-CM | POA: Diagnosis not present

## 2024-07-30 DIAGNOSIS — E1010 Type 1 diabetes mellitus with ketoacidosis without coma: Secondary | ICD-10-CM

## 2024-07-30 DIAGNOSIS — E10649 Type 1 diabetes mellitus with hypoglycemia without coma: Secondary | ICD-10-CM | POA: Diagnosis present

## 2024-07-30 DIAGNOSIS — E161 Other hypoglycemia: Secondary | ICD-10-CM | POA: Diagnosis not present

## 2024-07-30 DIAGNOSIS — R531 Weakness: Secondary | ICD-10-CM | POA: Diagnosis not present

## 2024-07-30 DIAGNOSIS — R231 Pallor: Secondary | ICD-10-CM | POA: Diagnosis not present

## 2024-07-30 DIAGNOSIS — E109 Type 1 diabetes mellitus without complications: Secondary | ICD-10-CM | POA: Diagnosis present

## 2024-07-30 DIAGNOSIS — N179 Acute kidney failure, unspecified: Secondary | ICD-10-CM

## 2024-07-30 DIAGNOSIS — Z7982 Long term (current) use of aspirin: Secondary | ICD-10-CM | POA: Diagnosis not present

## 2024-07-30 DIAGNOSIS — Z87891 Personal history of nicotine dependence: Secondary | ICD-10-CM | POA: Diagnosis not present

## 2024-07-30 DIAGNOSIS — D508 Other iron deficiency anemias: Secondary | ICD-10-CM | POA: Diagnosis not present

## 2024-07-30 DIAGNOSIS — I4510 Unspecified right bundle-branch block: Secondary | ICD-10-CM | POA: Diagnosis not present

## 2024-07-30 DIAGNOSIS — L930 Discoid lupus erythematosus: Secondary | ICD-10-CM

## 2024-07-30 DIAGNOSIS — E876 Hypokalemia: Secondary | ICD-10-CM | POA: Diagnosis not present

## 2024-07-30 DIAGNOSIS — E1043 Type 1 diabetes mellitus with diabetic autonomic (poly)neuropathy: Secondary | ICD-10-CM | POA: Diagnosis not present

## 2024-07-30 DIAGNOSIS — R630 Anorexia: Secondary | ICD-10-CM | POA: Diagnosis not present

## 2024-07-30 DIAGNOSIS — R112 Nausea with vomiting, unspecified: Secondary | ICD-10-CM | POA: Diagnosis present

## 2024-07-30 DIAGNOSIS — E785 Hyperlipidemia, unspecified: Secondary | ICD-10-CM | POA: Diagnosis not present

## 2024-07-30 DIAGNOSIS — E1143 Type 2 diabetes mellitus with diabetic autonomic (poly)neuropathy: Secondary | ICD-10-CM | POA: Diagnosis present

## 2024-07-30 DIAGNOSIS — R110 Nausea: Secondary | ICD-10-CM | POA: Diagnosis not present

## 2024-07-30 LAB — CBC WITH DIFFERENTIAL/PLATELET
Abs Immature Granulocytes: 0.01 K/uL (ref 0.00–0.07)
Basophils Absolute: 0.1 K/uL (ref 0.0–0.1)
Basophils Relative: 1 %
Eosinophils Absolute: 0.2 K/uL (ref 0.0–0.5)
Eosinophils Relative: 2 %
HCT: 29.8 % — ABNORMAL LOW (ref 39.0–52.0)
Hemoglobin: 9.6 g/dL — ABNORMAL LOW (ref 13.0–17.0)
Immature Granulocytes: 0 %
Lymphocytes Relative: 33 %
Lymphs Abs: 2.4 K/uL (ref 0.7–4.0)
MCH: 29.3 pg (ref 26.0–34.0)
MCHC: 32.2 g/dL (ref 30.0–36.0)
MCV: 90.9 fL (ref 80.0–100.0)
Monocytes Absolute: 0.4 K/uL (ref 0.1–1.0)
Monocytes Relative: 6 %
Neutro Abs: 4.2 K/uL (ref 1.7–7.7)
Neutrophils Relative %: 58 %
Platelets: 379 K/uL (ref 150–400)
RBC: 3.28 MIL/uL — ABNORMAL LOW (ref 4.22–5.81)
RDW: 14.1 % (ref 11.5–15.5)
WBC: 7.2 K/uL (ref 4.0–10.5)
nRBC: 0 % (ref 0.0–0.2)

## 2024-07-30 LAB — COMPREHENSIVE METABOLIC PANEL WITH GFR
ALT: 142 U/L — ABNORMAL HIGH (ref 0–44)
AST: 88 U/L — ABNORMAL HIGH (ref 15–41)
Albumin: 4.4 g/dL (ref 3.5–5.0)
Alkaline Phosphatase: 221 U/L — ABNORMAL HIGH (ref 38–126)
Anion gap: 11 (ref 5–15)
BUN: 14 mg/dL (ref 6–20)
CO2: 22 mmol/L (ref 22–32)
Calcium: 8.9 mg/dL (ref 8.9–10.3)
Chloride: 108 mmol/L (ref 98–111)
Creatinine, Ser: 1.25 mg/dL — ABNORMAL HIGH (ref 0.61–1.24)
GFR, Estimated: >60 mL/min (ref >60)
Glucose, Bld: 64 mg/dL — ABNORMAL LOW (ref 70–99)
Potassium: 3.4 mmol/L — ABNORMAL LOW (ref 3.5–5.1)
Sodium: 140 mmol/L (ref 135–145)
Total Bilirubin: 0.6 mg/dL (ref 0.0–1.2)
Total Protein: 7.7 g/dL (ref 6.5–8.1)

## 2024-07-30 LAB — CBG MONITORING, ED
Glucose-Capillary: 62 mg/dL — ABNORMAL LOW (ref 70–99)
Glucose-Capillary: 386 mg/dL — ABNORMAL HIGH (ref 70–99)
Glucose-Capillary: 349 mg/dL — ABNORMAL HIGH (ref 70–99)

## 2024-07-30 MED ORDER — SODIUM CHLORIDE 0.9 % IV SOLN: 250.0000 mL | INTRAVENOUS | Status: DC | PRN

## 2024-07-30 MED ORDER — ONDANSETRON HCL 4 MG/2ML IJ SOLN
4.0000 mg | Freq: Once | INTRAMUSCULAR | Status: AC
  Administered 2024-07-30: 4 mg via INTRAVENOUS
  Filled 2024-07-30: qty 2

## 2024-07-30 MED ORDER — SODIUM CHLORIDE 0.9% FLUSH
3.0000 mL | Freq: Two times a day (BID) | INTRAVENOUS | Status: DC
  Administered 2024-07-31: 3 mL via INTRAVENOUS

## 2024-07-30 MED ORDER — FAMOTIDINE 20 MG PO TABS
20.0000 mg | ORAL_TABLET | Freq: Once | ORAL | Status: AC
  Administered 2024-07-30: 20 mg via ORAL
  Filled 2024-07-30: qty 1

## 2024-07-30 MED ORDER — DEXTROSE 5 % IV SOLN: Freq: Once | INTRAVENOUS | Status: AC

## 2024-07-30 MED ORDER — SODIUM CHLORIDE 0.9% FLUSH: 3.0000 mL | INTRAVENOUS | Status: DC | PRN

## 2024-07-30 NOTE — ED Notes (Signed)
 Pt not wanting to eat or drink, stating that it upsets his acid reflux

## 2024-07-30 NOTE — ED Triage Notes (Signed)
 Patient BIB EMS from home c/o hypoglycemia 51 CBG at home. 15 G oral glucose by fire department , CBG went up to 110 when EMS arrived. Per EMS patient CBG went down to 65 again in the truck and 15G oral glucose was given again. Patient report he took his Long acting this morning.  Patient denies N/V. Patient denies Dizziness. Patient a/ox4.

## 2024-07-30 NOTE — ED Provider Notes (Signed)
 [place] EMERGENCY DEPARTMENT AT [facility] Provider Note   CSN: [id number] Arrival date & time: 07/30/24  8042     Patient presents with: Hypoglycemia   Jared Murray is a 48 y.o. male.   Patient is a 48 year old male with past medical history of type 1 diabetes presenting to the emergency department with hypoglycemia.  The patient states that he was alerted by his Dexcom that his blood sugar was low.  He states that he was feeling little weak and nauseous.  He states that he did take his long-acting insulin  this morning but has not had any mealtime insulin  today as he states that he has not really ate much today.  He states that he was busy helping his sister and did not have time to sit down for a meal.  When EMS arrived his blood sugar was in the 60s and they gave him oral glucose which improved his sugar to the 100s and then dropped again and route and got a second dose of glucose.  He denies any recent illnesses, fevers, vomiting or diarrhea, cough, congestion or sore throat.  The history is provided by the patient.  Hypoglycemia      Prior to Admission medications  Medication Sig Start Date End Date Taking? Authorizing Provider  Accu-Chek Softclix Lancets lancets Use to check blood sugar three times daily. E10.65 03/19/22   Newlin, Enobong, MD  aspirin -sod bicarb-citric acid (ALKA-SELTZER) 325 MG TBEF tablet Take 325 mg by mouth every 6 (six) hours as needed (heartburn).    [provider]  atorvastatin  (LIPITOR) 20 MG tablet TAKE 1 TABLET (20 MG TOTAL) BY MOUTH DAILY. Patient taking differently: Take 20 mg by mouth daily. 10/10/21 06/22/24  McClung, Angela M, PA-C  betamethasone  dipropionate (DIPROLENE ) 0.05 % ointment Apply to hands bid for 3 wks then take a break for 2 wks repeat as needed 06/22/24   Alm Delon SAILOR, DO  Blood Glucose Monitoring Suppl (ACCU-CHEK GUIDE) w/Device KIT Use to check blood sugar 3 times daily. E10.65 03/19/22   Newlin, Enobong,  MD  Continuous Glucose Receiver (DEXCOM G7 RECEIVER) DEVI Use to check blood sugar up to 4 times daily. E10.65 12/24/22   Newlin, Enobong, MD  Continuous Glucose Sensor (DEXCOM G7 SENSOR) MISC Use to check blood sugar up to 4 times daily. Change sensors once every 10 days. E10.65 08/10/23   Delbert Clam, MD  Continuous Glucose Sensor (DEXCOM G7 SENSOR) MISC Use 1 sensor every 10 days 08/18/23     esomeprazole  (NEXIUM ) 40 MG capsule Take 1 capsule (40 mg total) by mouth 2 (two) times daily before a meal. 03/27/23   Delbert Clam, MD  famotidine  (PEPCID ) 20 MG tablet Take 1 tablet (20 mg total) by mouth 2 (two) times daily. 03/27/23   Newlin, Enobong, MD  glucose blood (ACCU-CHEK GUIDE) test strip Use to check blood sugar three times daily. E10.65 03/19/22   Newlin, Enobong, MD  hydroxychloroquine  (PLAQUENIL ) 200 MG tablet Take 1 tablet (200 mg total) by mouth 2 (two) times daily. 06/22/24   Alm Delon SAILOR, DO  insulin  aspart (FIASP  FLEXTOUCH) 100 UNIT/ML FlexTouch Pen <150, do not take insulin . 151-200, take 2 units. 201-250, take 3 units. 251-300, take 4 units. 301-350, take 5 units. 351-400, take 6 units. Above 400, call Endocrine. 06/22/23   Newlin, Enobong, MD  Insulin  Glargine (BASAGLAR  KWIKPEN) 100 UNIT/ML Inject 12 Units into the skin at bedtime. 05/06/23   Newlin, Enobong, MD  INSULIN  SYRINGE 1CC/29G 29G X 1/2  1 ML MISC use as directed Patient taking differently: 1 each by Other route See admin instructions. use as directed 02/15/21   Newlin, Enobong, MD  sildenafil  (VIAGRA ) 50 MG tablet Take 1 tablet (50 mg total) by mouth daily as needed for erectile dysfunction (At least 24 hours between doses). 12/19/21   Newlin, Enobong, MD  tacrolimus  (PROTOPIC ) 0.1 % ointment Apply topically as directed. Bid to aa scalp, hands, feet for 2 weeks, alternating with Clobetasol  ointment 06/22/24   Alm Delon SAILOR, DO  Tetrahydrozoline HCl (VISINE OP) Place 1 drop into both eyes daily as needed (dryness /  allergies).    [provider]    Allergies: Bee venom, Latex, and Penicillins    Review of Systems  Updated Vital Signs BP (!) 173/131   Pulse 98   Temp 98.8 F (37.1 C)   Resp 20   SpO2 100%   Physical Exam Vitals and nursing note reviewed.  Constitutional:      General: He is not in acute distress.    Appearance: Normal appearance.  HENT:     Head: Normocephalic.     Nose: Nose normal.     Mouth/Throat:     Mouth: Mucous membranes are moist.     Pharynx: Oropharynx is clear.  Eyes:     Extraocular Movements: Extraocular movements intact.     Conjunctiva/sclera: Conjunctivae normal.  Cardiovascular:     Rate and Rhythm: Normal rate and regular rhythm.     Heart sounds: Normal heart sounds.  Pulmonary:     Effort: Pulmonary effort is normal.     Breath sounds: Normal breath sounds.  Abdominal:     General: Abdomen is flat.     Palpations: Abdomen is soft.     Tenderness: There is no abdominal tenderness.  Musculoskeletal:        General: Normal range of motion.     Cervical back: Normal range of motion.  Skin:    General: Skin is warm and dry.  Neurological:     General: No focal deficit present.     Mental Status: He is alert and oriented to person, place, and time.  Psychiatric:        Mood and Affect: Mood normal.        Behavior: Behavior normal.     (all labs ordered are listed, but only abnormal results are displayed) Labs Reviewed  CBC WITH DIFFERENTIAL/PLATELET - Abnormal; Notable for the following components:      Result Value   RBC 3.28 (*)    Hemoglobin 9.6 (*)    HCT 29.8 (*)    All other components within normal limits  COMPREHENSIVE METABOLIC PANEL WITH GFR - Abnormal; Notable for the following components:   Potassium 3.4 (*)    Glucose, Bld 64 (*)    Creatinine, Ser 1.25 (*)    AST 88 (*)    ALT 142 (*)    Alkaline Phosphatase 221 (*)    All other components within normal limits  CBG MONITORING, ED - Abnormal; Notable for  the following components:   Glucose-Capillary 62 (*)    All other components within normal limits  CBG MONITORING, ED - Abnormal; Notable for the following components:   Glucose-Capillary 386 (*)    All other components within normal limits  CBG MONITORING, ED - Abnormal; Notable for the following components:   Glucose-Capillary 349 (*)    All other components within normal limits    EKG: None  Radiology: US  Abdomen  Limited RUQ (LIVER/GB) Result Date: 07/30/2024 EXAM: Right Upper Quadrant Abdominal Ultrasound 07/30/2024 10:14:00 PM TECHNIQUE: Real-time ultrasonography of the right upper quadrant of the abdomen was performed. COMPARISON: CT abdomen and pelvis 08/01/2017. CLINICAL HISTORY: Elevated alanine aminotransferase (ALT) level. FINDINGS: LIVER: The liver is diffusely echogenic. No intrahepatic biliary ductal dilatation. No evidence of mass. BILIARY SYSTEM: No pericholecystic fluid or wall thickening. No cholelithiasis. Common bile duct measures 4.1 mm. RIGHT KIDNEY: The right kidney is grossly unremarkable in appearances without evidence of hydronephrosis, echogenic calculi or worrisome mass lesions. PANCREAS: Visualized portions of the pancreas are unremarkable. OTHER: No right upper quadrant ascites. IMPRESSION: 1. No acute findings. 2. Diffusely increased hepatic echogenicity, which can be seen with hepatic steatosis or other diffuse hepatocellular processes. Electronically signed by: Greig Pique MD 07/30/2024 10:18 PM EST RP Workstation: [id number]     Procedures   Medications Ordered in the ED  sodium chloride  flush (NS) 0.9 % injection 3 mL (3 mLs Intravenous Not Given 07/30/24 2149)  sodium chloride  flush (NS) 0.9 % injection 3 mL (has no administration in time range)  0.9 %  sodium chloride  infusion (has no administration in time range)  dextrose  5 % solution (0 mLs Intravenous Stopped 07/30/24 2139)  ondansetron  (ZOFRAN ) injection 4 mg (4 mg Intravenous Given 07/30/24  2111)  dextrose  5 % solution (0 mLs Intravenous Stopped 07/30/24 2259)  famotidine  (PEPCID ) tablet 20 mg (20 mg Oral Given 07/30/24 2243)    Clinical Course as of 07/30/24 2301  Sat Jul 30, 2024  2054 Patient reported to RN he is unable to drink but will not specify why. Will be started on D5 infusion and given zofran .  [VK]  2116 Transaminitis and AKI. Will have RUQ US  [VK]  2152 Repeat glucose 386, rate of D5 drip turned down from 100/hr to 50 cc/hr and will repeat.  [VK]  2228 No gallbladder disease on ultrasound, signs of hepatic steatosis.  [VK]  2235 Patient hasn't been able to tolerate PO since being in the ED, will be admitted for hypoglycemia and inability to tolerate PO.  [VK]  2238 Repeat glucose still >300, will stop d5. Will try pepcid  for his heartburn.  [VK]    Clinical Course User Index [VK] Kingsley, Charnika Herbst K, DO                                 Medical Decision Making This patient presents to the ED with chief complaint(s) of hypoglycemia with pertinent past medical history of type 1 diabetes which further complicates the presenting complaint. The complaint involves an extensive differential diagnosis and also carries with it a high risk of complications and morbidity.    The differential diagnosis includes medication side effect with poor appetite today, infection, electrolyte abnormality  Additional history obtained: Additional history obtained from N/A Records reviewed Care Everywhere/External Records  ED Course and Reassessment: On patient's arrival he is hypertensive but otherwise hemodynamically stable in no acute distress.  Blood glucose on arrival was 62 with a normal mental status.  The patient will be given juice and crackers and will have continuous glucose checks.  Will additionally have labs to evaluate for other etiologies of his hypoglycemia and will be closely reassessed.  Independent labs interpretation:  The following labs were independently  interpreted: transaminitis, hypoglycemia -> hyperglycemia after starting D5  Independent visualization of imaging: - I independently visualized the following imaging with scope of interpretation limited to determining  acute life threatening conditions related to emergency care: RUQ US , which revealed hepatic steatosis  Consultation: - Consulted or discussed management/test interpretation w/ external professional: hospitalist  Consideration for admission or further workup: patient requires admission for inability to tolerate PO Social Determinants of health: N/A    Amount and/or Complexity of Data Reviewed Labs: ordered. Radiology: ordered.  Risk Prescription drug management. Decision regarding hospitalization.       Final diagnoses:  Hypoglycemia  Unable to eat  Transaminitis  AKI (acute kidney injury)    ED Discharge Orders     None          Kingsley, Kevionna Heffler K, DO 07/30/24 2301

## 2024-07-30 NOTE — H&P (Signed)
 History and Physical    Patient: Jared Murray FMW:[id number] DOB: 05/13/1976 DOA: 07/30/2024 DOS: the patient was seen and examined on 07/30/2024 PCP: Patient, No Pcp Per  Patient coming from: Home  Chief Complaint:  Chief Complaint  Patient presents with   Hypoglycemia   HPI: Jared Murray is a 48 y.o. male with medical history significant of insulin -dependent diabetes, hyperlipidemia, history of avascular necrosis, polysubstance abuse, tobacco abuse depression, osteoarthritis, prolonged QT interval, severe protein calorie malnutrition who was brought in by EMS secondary to hypoglycemia at home.  Patient is on long-acting and short acting insulin .  He took his long-acting insulin  this morning but not the short acting.  Patient arrived the ER with blood glucose of 51 at home by EMS.  He got 15 g of oral glucose in the ER and blood sugar went to 110 but thereafter dropped down to 65 again.  D5 was initiated and his blood sugar has gone up to 300s so this was stopped.  Patient continues to have nausea with vomiting.  He has history of diabetic gastroparesis and currently unable to keep anything down so he is being admitted to the hospital for management.  Review of Systems: As mentioned in the history of present illness. All other systems reviewed and are negative. Past Medical History:  Diagnosis Date   Acute pericarditis 03/31/2016   AKI (acute kidney injury) 01/22/2015   Allergy    Anxiety    per pt   Arthritis    Avascular necrosis (HCC) 02/12/2015   Avascular necrosis of bone of hip (HCC)    Chest wall abscess 04/03/2015   Chronic diarrhea of unknown origin 09/10/2017   Cutaneous abscess of chest wall    Depression    per pt   DKA (diabetic ketoacidoses)    DKA, type 1, not at goal [facility]) 04/03/2015   Eczema    Elevated liver enzymes 11/27/2018   GERD (gastroesophageal reflux disease)    HLD (hyperlipidemia) 11/25/2018   no meds   Hyperbilirubinemia    Hyperglycemia due to  type 1 diabetes mellitus (HCC)    Hyperkalemia    Hypokalemia 03/17/2013   Left hip pain 04/18/2014   Loss of weight 03/17/2013   Marijuana use 11/25/2018   Osteoarthritis 12/24/2016   Prolonged QT interval 11/27/2018   in setting of RBBB and hypokalemia   Protein-calorie malnutrition, severe 03/17/2013   Psoriasis    RBBB 12/23/2018   Chronic since at least 2014   Sepsis (HCC) 01/22/2015   Substance abuse (HCC)    Tobacco use disorder 09/13/2015   Type 1 diabetes mellitus (HCC)    Past Surgical History:  Procedure Laterality Date   COLONOSCOPY  2020   COLONOSCOPY WITH PROPOFOL  N/A 05/07/2020   Procedure: COLONOSCOPY WITH PROPOFOL ;  Surgeon: Wilhelmenia Aloha Raddle., MD;  Location: [facility] ENDOSCOPY;  Service: Gastroenterology;  Laterality: N/A;   ENDOSCOPIC MUCOSAL RESECTION N/A 05/07/2020   Procedure: ENDOSCOPIC MUCOSAL RESECTION;  Surgeon: Wilhelmenia Aloha Raddle., MD;  Location: [facility] ENDOSCOPY;  Service: Gastroenterology;  Laterality: N/A;   FINGER SURGERY Right    middle finger   INCISION AND DRAINAGE ABSCESS Left 01/07/2022   Procedure: IRRIGATION AND DEBRIDEMENT OF LEFT HAND;  Surgeon: Josefina Chew, MD;  Location: [facility] ORS;  Service: Orthopedics;  Laterality: Left;   SUBMUCOSAL LIFTING INJECTION  05/07/2020   Procedure: SUBMUCOSAL LIFTING INJECTION;  Surgeon: Wilhelmenia Aloha Raddle., MD;  Location: [facility] ENDOSCOPY;  Service: Gastroenterology;;   Social History:  reports that he quit smoking about  5 years ago. His smoking use included cigarettes. He started smoking about 15 years ago. He has a 2.5 pack-year smoking history. He has never used smokeless tobacco. He reports current alcohol use of about 1.0 - 2.0 standard drink of alcohol per week. He reports current drug use. Drug: Marijuana.  Allergies[1]  Family History  Problem Relation Age of Onset   Hypertension Father    Diabetes Other        multiple   Lupus Cousin    Stroke Maternal Grandmother    Stroke Paternal Grandmother    Colon cancer  Neg Hx    Stomach cancer Neg Hx    Rectal cancer Neg Hx    Esophageal cancer Neg Hx    Colon polyps Neg Hx     Prior to Admission medications  Medication Sig Start Date End Date Taking? Authorizing Provider  Accu-Chek Softclix Lancets lancets Use to check blood sugar three times daily. E10.65 03/19/22   Newlin, Enobong, MD  aspirin -sod bicarb-citric acid (ALKA-SELTZER) 325 MG TBEF tablet Take 325 mg by mouth every 6 (six) hours as needed (heartburn).    [provider]  atorvastatin  (LIPITOR) 20 MG tablet TAKE 1 TABLET (20 MG TOTAL) BY MOUTH DAILY. Patient taking differently: Take 20 mg by mouth daily. 10/10/21 06/22/24  McClung, Angela M, PA-C  betamethasone  dipropionate (DIPROLENE ) 0.05 % ointment Apply to hands bid for 3 wks then take a break for 2 wks repeat as needed 06/22/24   Alm Delon SAILOR, DO  Blood Glucose Monitoring Suppl (ACCU-CHEK GUIDE) w/Device KIT Use to check blood sugar 3 times daily. E10.65 03/19/22   Newlin, Enobong, MD  Continuous Glucose Receiver (DEXCOM G7 RECEIVER) DEVI Use to check blood sugar up to 4 times daily. E10.65 12/24/22   Newlin, Enobong, MD  Continuous Glucose Sensor (DEXCOM G7 SENSOR) MISC Use to check blood sugar up to 4 times daily. Change sensors once every 10 days. E10.65 08/10/23   Delbert Clam, MD  Continuous Glucose Sensor (DEXCOM G7 SENSOR) MISC Use 1 sensor every 10 days 08/18/23     esomeprazole  (NEXIUM ) 40 MG capsule Take 1 capsule (40 mg total) by mouth 2 (two) times daily before a meal. 03/27/23   Delbert Clam, MD  famotidine  (PEPCID ) 20 MG tablet Take 1 tablet (20 mg total) by mouth 2 (two) times daily. 03/27/23   Newlin, Enobong, MD  glucose blood (ACCU-CHEK GUIDE) test strip Use to check blood sugar three times daily. E10.65 03/19/22   Newlin, Enobong, MD  hydroxychloroquine  (PLAQUENIL ) 200 MG tablet Take 1 tablet (200 mg total) by mouth 2 (two) times daily. 06/22/24   Alm Delon SAILOR, DO  insulin  aspart (FIASP  FLEXTOUCH) 100 UNIT/ML  FlexTouch Pen <150, do not take insulin . 151-200, take 2 units. 201-250, take 3 units. 251-300, take 4 units. 301-350, take 5 units. 351-400, take 6 units. Above 400, call Endocrine. 06/22/23   Newlin, Enobong, MD  Insulin  Glargine (BASAGLAR  KWIKPEN) 100 UNIT/ML Inject 12 Units into the skin at bedtime. 05/06/23   Newlin, Enobong, MD  INSULIN  SYRINGE 1CC/29G 29G X 1/2 1 ML MISC use as directed Patient taking differently: 1 each by Other route See admin instructions. use as directed 02/15/21   Newlin, Enobong, MD  sildenafil  (VIAGRA ) 50 MG tablet Take 1 tablet (50 mg total) by mouth daily as needed for erectile dysfunction (At least 24 hours between doses). 12/19/21   Newlin, Enobong, MD  tacrolimus  (PROTOPIC ) 0.1 % ointment Apply topically as directed. Bid to aa scalp, hands,  feet for 2 weeks, alternating with Clobetasol  ointment 06/22/24   Alm Delon SAILOR, DO  Tetrahydrozoline HCl (VISINE OP) Place 1 drop into both eyes daily as needed (dryness / allergies).    [provider]    Physical Exam: Vitals:   07/30/24 2007  BP: (!) 173/131  Pulse: 98  Resp: 20  Temp: 98.8 F (37.1 C)  SpO2: 100%   Constitutional: Acutely ill looking, NAD, calm, comfortable Eyes: PERRL, lids and conjunctivae normal ENMT: Mucous membranes are dry.  Posterior pharynx clear of any exudate or lesions.Normal dentition.  Neck: Continue to monitor normal, supple, no masses, no thyromegaly Respiratory: clear to auscultation bilaterally, no wheezing, no crackles. Normal respiratory effort. No accessory muscle use.  Cardiovascular: Sinus tachycardia, no murmurs / rubs / gallops. No extremity edema. 2+ pedal pulses. No carotid bruits.  Abdomen: no tenderness, no masses palpated. No hepatosplenomegaly. Bowel sounds positive.  Musculoskeletal: Good range of motion, no joint swelling or tenderness, Skin: no rashes, lesions, ulcers. No induration Neurologic: CN 2-12 grossly intact. Sensation intact, DTR normal. Strength  5/5 in all 4.  Depressed mood  Data Reviewed:  Temperature98.8, blood pressure 170/131, white count 7.2 hemoglobin 9.6, potassium 3.4 creatinine 1.25 calcium  8.9 glucose 64, alkaline phosphatase 221 AST 88 ALT 142, abdominal ultrasound showed no acute findings.  There is also diffusely increased hepatic echogenicity which can be seen with hepatic steatosis or other diffuse hepatocellular process  Assessment and Plan:  #1 hypoglycemia: Probably a combination of poor oral intake and patient taking his long-acting insulin .  Will admit the patient for observation.  D5 as needed will hold all insulin .  Continue to monitor.  #2 hypokalemia: Replete potassium continue to monitor  #3 anemia: H&H is stable.  Continue to monitor  #4 AKI: Continue to monitor renal function  #5 type 1 diabetes: Holding insulin  for now.  Continue to monitor  #6 diabetic gastroparesis: Patient has been having intractable nausea with vomiting.  Will initiate IV Reglan .  Continue to monitor  #7 generalized anxiety disorder: Continue monitor as well   #8 transaminitis: Continue to monitor.    Advance Care Planning:   Code Status: Full Code   Consults: None  Family Communication: Wife and family at bedside  Severity of Illness: The appropriate patient status for this patient is INPATIENT. Inpatient status is judged to be reasonable and necessary in order to provide the required intensity of service to ensure the patient's safety. The patient's presenting symptoms, physical exam findings, and initial radiographic and laboratory data in the context of their chronic comorbidities is felt to place them at high risk for further clinical deterioration. Furthermore, it is not anticipated that the patient will be medically stable for discharge from the hospital within 2 midnights of admission.   * I certify that at the point of admission it is my clinical judgment that the patient will require inpatient hospital care  spanning beyond 2 midnights from the point of admission due to high intensity of service, high risk for further deterioration and high frequency of surveillance required.*  AuthorBETHA SIM KNOLL, MD 07/30/2024 11:00 PM  For on call review [email or web address].      [1]  Allergies Allergen Reactions   Bee Venom Swelling   Latex Itching and Rash   Penicillins Other (See Comments)    Childhood allergy Has patient had a PCN reaction causing immediate rash, facial/tongue/throat swelling, SOB or lightheadedness with hypotension: NO Has patient had a PCN reaction causing severe rash involving mucus membranes or  skin necrosis:NO Has patient had a PCN reaction that required hospitalization NO Has patient had a PCN reaction occurring within the last 10 years: NO If all of the above answers are NO, then may proceed with Cephalosporin use.

## 2024-07-31 DIAGNOSIS — E162 Hypoglycemia, unspecified: Secondary | ICD-10-CM | POA: Diagnosis not present

## 2024-07-31 LAB — CREATININE, SERUM
Creatinine, Ser: 1.14 mg/dL (ref 0.61–1.24)
GFR, Estimated: >60 mL/min (ref >60)

## 2024-07-31 LAB — CBC
HCT: 25.9 % — ABNORMAL LOW (ref 39.0–52.0)
Hemoglobin: 8.5 g/dL — ABNORMAL LOW (ref 13.0–17.0)
MCH: 29.3 pg (ref 26.0–34.0)
MCHC: 32.8 g/dL (ref 30.0–36.0)
MCV: 89.3 fL (ref 80.0–100.0)
Platelets: 315 K/uL (ref 150–400)
RBC: 2.9 MIL/uL — ABNORMAL LOW (ref 4.22–5.81)
RDW: 14.1 % (ref 11.5–15.5)
WBC: 6.6 K/uL (ref 4.0–10.5)
nRBC: 0 % (ref 0.0–0.2)

## 2024-07-31 LAB — CBG MONITORING, ED
Glucose-Capillary: 390 mg/dL — ABNORMAL HIGH (ref 70–99)
Glucose-Capillary: 292 mg/dL — ABNORMAL HIGH (ref 70–99)
Glucose-Capillary: 163 mg/dL — ABNORMAL HIGH (ref 70–99)

## 2024-07-31 LAB — HIV ANTIBODY (ROUTINE TESTING W REFLEX): HIV Screen 4th Generation wRfx: NONREACTIVE

## 2024-07-31 MED ORDER — PANTOPRAZOLE SODIUM 40 MG PO TBEC
40.0000 mg | DELAYED_RELEASE_TABLET | Freq: Every day | ORAL | Status: DC
  Administered 2024-07-31: 40 mg via ORAL
  Filled 2024-07-31: qty 1

## 2024-07-31 MED ORDER — LACTATED RINGERS IV SOLN: INTRAVENOUS | Status: DC

## 2024-07-31 MED ORDER — HYDROXYCHLOROQUINE SULFATE 200 MG PO TABS
200.0000 mg | ORAL_TABLET | Freq: Two times a day (BID) | ORAL | Status: DC
  Administered 2024-07-31: 200 mg via ORAL
  Filled 2024-07-31 (×2): qty 1

## 2024-07-31 MED ORDER — FERROUS SULFATE 325 (65 FE) MG PO TABS: 325.0000 mg | ORAL_TABLET | Freq: Every day | ORAL | 0 refills | Status: AC

## 2024-07-31 MED ORDER — DEXTROSE 50 % IV SOLN: 12.5000 g | INTRAVENOUS | Status: DC

## 2024-07-31 MED ORDER — METOCLOPRAMIDE HCL 5 MG/ML IJ SOLN
5.0000 mg | Freq: Four times a day (QID) | INTRAMUSCULAR | Status: DC
  Administered 2024-07-31 (×2): 5 mg via INTRAVENOUS
  Filled 2024-07-31 (×2): qty 2

## 2024-07-31 MED ORDER — INSULIN GLARGINE 100 UNIT/ML ~~LOC~~ SOLN
9.0000 [IU] | Freq: Every day | SUBCUTANEOUS | Status: DC
  Filled 2024-07-31: qty 0.09

## 2024-07-31 MED ORDER — INSULIN ASPART 100 UNIT/ML IJ SOLN
0.0000 [IU] | Freq: Three times a day (TID) | INTRAMUSCULAR | Status: DC
  Administered 2024-07-31: 5 [IU] via SUBCUTANEOUS
  Filled 2024-07-31: qty 5

## 2024-07-31 MED ORDER — FERROUS SULFATE 325 (65 FE) MG PO TABS
325.0000 mg | ORAL_TABLET | Freq: Every day | ORAL | Status: DC
  Administered 2024-07-31: 325 mg via ORAL
  Filled 2024-07-31: qty 1

## 2024-07-31 MED ORDER — ENOXAPARIN SODIUM 40 MG/0.4ML IJ SOSY
40.0000 mg | PREFILLED_SYRINGE | INTRAMUSCULAR | Status: DC
  Administered 2024-07-31: 40 mg via SUBCUTANEOUS
  Filled 2024-07-31: qty 0.4

## 2024-07-31 NOTE — Discharge Summary (Addendum)
 Physician Discharge Summary  Jared Murray FMW:[id number] DOB: 1976-06-27 DOA: 07/30/2024  PCP: Patient, No Pcp Per  Admit date: 07/30/2024 Discharge date: 07/31/2024  Admitted From: Home Disposition:  Home  Discharge Condition:Stable CODE STATUS:FULL Diet recommendation: Carb Modified  Brief/Interim Summary: Patient is a 48 y.o. male with medical history significant of insulin -dependent diabetes, hyperlipidemia, history of avascular necrosis, polysubstance abuse, tobacco abuse depression, osteoarthritis, prolonged QT interval, severe protein calorie malnutrition who was brought in by EMS secondary to hypoglycemia at home.  Patient is on long-acting and short acting insulin .  He took his long-acting insulin  this morning but not the short acting.  Patient arrived the ER with blood glucose of 51 at home by EMS.  He got 15 g of oral glucose in the ER and blood sugar went to 110 but thereafter dropped down to 65 again.  D5 was initiated and his blood sugar has gone up to 300s so this was stopped.  This morning he remains very comfortable.  Blood sugars have been in the high range in the 300s to 200s.  We have restarted his Lantus .  He also takes sliding scale at home.  He is very eager to go home today.  He denies any nausea, vomiting or abdominal pain this morning.  Medically stable for discharge home today.  He needs to follow-up with his PCP and endocrinologist for his management of diabetes.  Following problems were addressed during the hospitalization:  #1 diabetes type 1/hypoglycemia: Probably a combination of poor oral intake and patient taking his long-acting insulin .  He feels much better today.  Denies abdomen pain, nausea or vomiting.  Sugars on the higher range.  Restart home insulin  regimen on discharge.  Follow-up with PCP, endocrinologist. Last A1c on 02/24/2024 was 10.3.  #2 hypokalemia: Supplemented   #3  Chronic iron-deficiency anemia: He follows with gastroenterology.  Recent  EGD/colonoscopy did not show any source of bleeding.  Continue oral iron supplementation.  He has been planned to follow-up with hematology as an outpatient.  Check CBC in a week.  Continue PPI   #4 AKI: Resolved   #5 diabetic gastroparesis: GI nausea, vomiting this morning.  Management on examination  #7 generalized anxiety disorder: Takes amitriptyline   #8 transaminitis: Unclear etiology.  Mild elevated liver enzymes.  Abdomen is benign on examination.  Check CMP in a week.US  RUQ showed no acute findings. ,diffusely increased hepatic echogenicity,likely  hepatic steatosis   #9 hyperlipidemia: Takes Lipitor 20 mg at home.  Will stop this because of elevated liver enzymes.  Check CMP in a week if liver enzymes normalize, can restart taking.   Discharge Diagnoses:  Principal Problem:   Hypoglycemia Active Problems:   GERD (gastroesophageal reflux disease)   Type 1 diabetes mellitus (HCC)   Generalized anxiety disorder   Non compliance with medical treatment   Nausea and vomiting   Diabetic gastroparesis (HCC)   Transaminitis    Discharge Instructions  Discharge Instructions     Diet Carb Modified   Complete by: As directed    Discharge instructions   Complete by: As directed    1)Please take your medications as instructed 2)Monitor your blood sugars at home.  Follow-up with your PCP and endocrinologist for the management of your diabetes 3)Do a CBC test  to check your hemoglobin in 2 weeks   Increase activity slowly   Complete by: As directed       Allergies as of 07/31/2024       Reactions  Bee Venom Swelling   Latex Itching, Rash   Penicillins Other (See Comments)   Childhood allergy Has patient had a PCN reaction causing immediate rash, facial/tongue/throat swelling, SOB or lightheadedness with hypotension: NO Has patient had a PCN reaction causing severe rash involving mucus membranes or skin necrosis:NO Has patient had a PCN reaction that required  hospitalization NO Has patient had a PCN reaction occurring within the last 10 years: NO If all of the above answers are NO, then may proceed with Cephalosporin use.        Medication List     STOP taking these medications    Accu-Chek Guide test strip Generic drug: glucose blood   Accu-Chek Guide w/Device Kit   famotidine  20 MG tablet Commonly known as: PEPCID    INSULIN  SYRINGE 1CC/29G 29G X 1/2 1 ML Misc       TAKE these medications    Accu-Chek Softclix Lancets lancets Use to check blood sugar three times daily. E10.65   aspirin -sod bicarb-citric acid 325 MG Tbef tablet Commonly known as: ALKA-SELTZER Take 325 mg by mouth every 6 (six) hours as needed (heartburn).   atorvastatin  20 MG tablet Commonly known as: LIPITOR TAKE 1 TABLET (20 MG TOTAL) BY MOUTH DAILY. What changed: how much to take   Basaglar  KwikPen 100 UNIT/ML Inject 12 Units into the skin at bedtime. What changed: how much to take   betamethasone  dipropionate 0.05 % ointment Commonly known as: DIPROLENE  Apply to hands bid for 3 wks then take a break for 2 wks repeat as needed   Dexcom G7 Receiver Devi Use to check blood sugar up to 4 times daily. E10.65   Dexcom G7 Sensor Misc Use to check blood sugar up to 4 times daily. Change sensors once every 10 days. E10.65 What changed: Another medication with the same name was removed. Continue taking this medication, and follow the directions you see here.   DULoxetine 30 MG capsule Commonly known as: CYMBALTA Take 90 mg by mouth daily.   esomeprazole  40 MG capsule Commonly known as: NEXIUM  Take 1 capsule (40 mg total) by mouth 2 (two) times daily before a meal. What changed:  when to take this additional instructions   ferrous sulfate  325 (65 FE) MG tablet Take 1 tablet (325 mg total) by mouth daily with breakfast.   Fiasp  FlexTouch 100 UNIT/ML FlexTouch Pen Generic drug: insulin  aspart <150, do not take insulin . 151-200, take 2 units.  201-250, take 3 units. 251-300, take 4 units. 301-350, take 5 units. 351-400, take 6 units. Above 400, call Endocrine.   hydroxychloroquine  200 MG tablet Commonly known as: Plaquenil  Take 1 tablet (200 mg total) by mouth 2 (two) times daily.   ondansetron  4 MG disintegrating tablet Commonly known as: ZOFRAN -ODT Take 4 mg by mouth every 8 (eight) hours as needed.   sildenafil  50 MG tablet Commonly known as: Viagra  Take 1 tablet (50 mg total) by mouth daily as needed for erectile dysfunction (At least 24 hours between doses).   tacrolimus  0.1 % ointment Commonly known as: PROTOPIC  Apply topically as directed. Bid to aa scalp, hands, feet for 2 weeks, alternating with Clobetasol  ointment   VISINE OP Place 1 drop into both eyes daily as needed (dryness / allergies).        Allergies[1]  Consultations: None   Procedures/Studies: US  Abdomen Limited RUQ (LIVER/GB) Result Date: 07/30/2024 EXAM: Right Upper Quadrant Abdominal Ultrasound 07/30/2024 10:14:00 PM TECHNIQUE: Real-time ultrasonography of the right upper quadrant of the abdomen was performed.  COMPARISON: CT abdomen and pelvis 08/01/2017. CLINICAL HISTORY: Elevated alanine aminotransferase (ALT) level. FINDINGS: LIVER: The liver is diffusely echogenic. No intrahepatic biliary ductal dilatation. No evidence of mass. BILIARY SYSTEM: No pericholecystic fluid or wall thickening. No cholelithiasis. Common bile duct measures 4.1 mm. RIGHT KIDNEY: The right kidney is grossly unremarkable in appearances without evidence of hydronephrosis, echogenic calculi or worrisome mass lesions. PANCREAS: Visualized portions of the pancreas are unremarkable. OTHER: No right upper quadrant ascites. IMPRESSION: 1. No acute findings. 2. Diffusely increased hepatic echogenicity, which can be seen with hepatic steatosis or other diffuse hepatocellular processes. Electronically signed by: Greig Pique MD 07/30/2024 10:18 PM EST RP Workstation: [id number]       Subjective: Patient seen and examined at bedside today.  Hemodynamically stable.  Comfortably lying in bed.  Denied any nausea, vomiting or abdominal pain.  Sugars on the higher range.  He is eager to go home today.  He has insulin  at home  Discharge Exam: Vitals:   07/31/24 0615 07/31/24 0630  BP:    Pulse: 80 84  Resp: 10 18  Temp:    SpO2: 99% 100%   Vitals:   07/31/24 0501 07/31/24 0600 07/31/24 0615 07/31/24 0630  BP:  (!) 122/93    Pulse:  81 80 84  Resp:  12 10 18   Temp: 98.1 F (36.7 C)     TempSrc: Oral     SpO2:  99% 99% 100%    General: Pt is alert, awake, not in acute distress Cardiovascular: RRR, S1/S2 +, no rubs, no gallops Respiratory: CTA bilaterally, no wheezing, no rhonchi Abdominal: Soft, NT, ND, bowel sounds + Extremities: no edema, no cyanosis    The results of significant diagnostics from this hospitalization (including imaging, microbiology, ancillary and laboratory) are listed below for reference.     Microbiology: No results found for this or any previous visit (from the past 240 hours).   Labs: BNP (last 3 results) No results for input(s): BNP in the last 8760 hours. Basic Metabolic Panel: Recent Labs  Lab 07/30/24 2034 07/31/24 0451  NA 140  --   K 3.4*  --   CL 108  --   CO2 22  --   GLUCOSE 64*  --   BUN 14  --   CREATININE 1.25* 1.14  CALCIUM  8.9  --    Liver Function Tests: Recent Labs  Lab 07/30/24 2034  AST 88*  ALT 142*  ALKPHOS 221*  BILITOT 0.6  PROT 7.7  ALBUMIN 4.4   No results for input(s): LIPASE, AMYLASE in the last 168 hours. No results for input(s): AMMONIA in the last 168 hours. CBC: Recent Labs  Lab 07/30/24 2034 07/31/24 0451  WBC 7.2 6.6  NEUTROABS 4.2  --   HGB 9.6* 8.5*  HCT 29.8* 25.9*  MCV 90.9 89.3  PLT 379 315   Cardiac Enzymes: No results for input(s): CKTOTAL, CKMB, CKMBINDEX, TROPONINI in the last 168 hours. BNP: Invalid input(s): POCBNP CBG: Recent  Labs  Lab 07/30/24 2009 07/30/24 2136 07/30/24 2236 07/31/24 0143 07/31/24 0802  GLUCAP 62* 386* 349* 390* 292*   D-Dimer No results for input(s): DDIMER in the last 72 hours. Hgb A1c No results for input(s): HGBA1C in the last 72 hours. Lipid Profile No results for input(s): CHOL, HDL, LDLCALC, TRIG, CHOLHDL, LDLDIRECT in the last 72 hours. Thyroid  function studies No results for input(s): TSH, T4TOTAL, T3FREE, THYROIDAB in the last 72 hours.  Invalid input(s): FREET3 Anemia work up No results for  input(s): VITAMINB12, FOLATE, FERRITIN, TIBC, IRON, RETICCTPCT in the last 72 hours. Urinalysis    Component Value Date/Time   COLORURINE AMBER (A) 01/04/2022 0429   APPEARANCEUR CLEAR 01/04/2022 0429   LABSPEC 1.029 01/04/2022 0429   PHURINE 5.0 01/04/2022 0429   GLUCOSEU 50 (A) 01/04/2022 0429   HGBUR NEGATIVE 01/04/2022 0429   BILIRUBINUR NEGATIVE 01/04/2022 0429   BILIRUBINUR neg 03/16/2015 1230   KETONESUR 20 (A) 01/04/2022 0429   PROTEINUR 100 (A) 01/04/2022 0429   UROBILINOGEN 0.2 06/25/2015 0913   NITRITE NEGATIVE 01/04/2022 0429   LEUKOCYTESUR NEGATIVE 01/04/2022 0429   Sepsis Labs Recent Labs  Lab 07/30/24 2034 07/31/24 0451  WBC 7.2 6.6   Microbiology No results found for this or any previous visit (from the past 240 hours).  Please note: You were cared for by a hospitalist during your hospital stay. Once you are discharged, your primary care physician will handle any further medical issues. Please note that NO REFILLS for any discharge medications will be authorized once you are discharged, as it is imperative that you return to your primary care physician (or establish a relationship with a primary care physician if you do not have one) for your post hospital discharge needs so that they can reassess your need for medications and monitor your lab values.    Time coordinating discharge: 40 minutes  SIGNED:   Ivonne Mustache, MD  [facility] 07/31/2024, 8:25 AM Pager [phone]  If 7PM-7AM, please contact night-coverage [email or web address] Password [email or web address]     [1]  Allergies Allergen Reactions   Bee Venom Swelling   Latex Itching and Rash   Penicillins Other (See Comments)    Childhood allergy Has patient had a PCN reaction causing immediate rash, facial/tongue/throat swelling, SOB or lightheadedness with hypotension: NO Has patient had a PCN reaction causing severe rash involving mucus membranes or skin necrosis:NO Has patient had a PCN reaction that required hospitalization NO Has patient had a PCN reaction occurring within the last 10 years: NO If all of the above answers are NO, then may proceed with Cephalosporin use.

## 2024-07-31 NOTE — Care Management CC44 (Signed)
 Condition Code 44 Documentation Completed  Patient Details  Name: Jared Murray MRN: [id number] Date of Birth: 1975/11/17   Condition Code 44 given:  Yes Patient signature on Condition Code 44 notice:  Yes Documentation of 2 MD's agreement:  Yes Code 44 added to claim:  Yes    Jlyn Cerros M Ellisha Bankson, LCSW 07/31/2024, 9:31 AM

## 2024-08-01 ENCOUNTER — Telehealth: Payer: Self-pay

## 2024-08-01 NOTE — Telephone Encounter (Signed)
 Patient was identified as falling into the [place] Measure - Diabetes.   Patient was: Attribution and/or data issue.  Validation/Investigation needed.  Explanation:  Patient not listed as patient with [facility]. No answer no machine when calling patient.

## 2024-08-03 ENCOUNTER — Telehealth: Payer: Self-pay

## 2024-08-03 NOTE — Telephone Encounter (Signed)
 Patient was identified as falling into the [place] Measure - Diabetes.   Patient was: Attribution and/or data issue.  Validation/Investigation needed.  Explanation:  Patient is not using [facility] for PCP. They use [facility]

## 2025-06-26 ENCOUNTER — Ambulatory Visit: Admitting: Dermatology
